# Patient Record
Sex: Female | Born: 1980 | Race: White | Hispanic: No | Marital: Single | Smoking: Never smoker
Health system: Southern US, Community
[De-identification: ages and names within clinical notes are randomized; demographics above are authoritative.]

## PROBLEM LIST (undated history)

## (undated) DIAGNOSIS — C801 Malignant (primary) neoplasm, unspecified: Secondary | ICD-10-CM

## (undated) DIAGNOSIS — R519 Headache, unspecified: Secondary | ICD-10-CM

## (undated) DIAGNOSIS — F419 Anxiety disorder, unspecified: Secondary | ICD-10-CM

## (undated) DIAGNOSIS — Z923 Personal history of irradiation: Secondary | ICD-10-CM

## (undated) DIAGNOSIS — M1990 Unspecified osteoarthritis, unspecified site: Secondary | ICD-10-CM

## (undated) DIAGNOSIS — K219 Gastro-esophageal reflux disease without esophagitis: Secondary | ICD-10-CM

## (undated) DIAGNOSIS — F410 Panic disorder [episodic paroxysmal anxiety] without agoraphobia: Secondary | ICD-10-CM

## (undated) DIAGNOSIS — F32A Depression, unspecified: Secondary | ICD-10-CM

## (undated) DIAGNOSIS — D649 Anemia, unspecified: Secondary | ICD-10-CM

## (undated) HISTORY — DX: Depression, unspecified: F32.A

## (undated) HISTORY — DX: Anxiety disorder, unspecified: F41.9

---

## 2020-02-28 ENCOUNTER — Telehealth: Payer: Self-pay | Admitting: Hematology

## 2020-02-28 NOTE — Telephone Encounter (Signed)
Received an urgent msg from [facility], breast navigator, regarding a referral from [facility] mammography for metastatic breast cancer. Caitlyn Hendricks has been cld and scheduled to see Caitlyn Hendricks on 9/3 at 11am. Pt aware to arrive 15 minutes early.

## 2020-03-08 NOTE — Progress Notes (Signed)
[place]   Telephone:([phone] Fax:([phone]) [place] Note   Patient Care Team: [name], MD as PCP - General (Family Medicine) Mauro Kaufmann, RN as Oncology Nurse Navigator Rockwell Germany, RN as Oncology Nurse Navigator  Date of Service:  03/10/2020   CHIEF COMPLAINTS/PURPOSE OF CONSULTATION:  Newly Diagnosed Left Breast cancer   REFERRING PHYSICIAN:  Gyn, Dr Zenaida Niece   Oncology History Overview Note  Cancer Staging Cancer of central portion of left female breast [facility]) Staging form: Breast, AJCC 8th Edition - Clinical: No stage assigned - Unsigned    Cancer of central portion of left female breast ([place])  02/23/2020 Mammogram   Diagnostic Mammogram 02/23/20  IMPRESSION The 2x1x2.6cm irregular mass in teh left breast at 12:00 posiiton middle depth is highly suspicious of malignancy. An [place] is recommended for further evaluation and biopsy planning purposes.   02/23/2020 Breast US   [name] 02/23/20  IMPRESSION 2 adjacent spiculated masses in the left brast at 12:00 position 3 cm from the nipple (2.1x0.9x1.1cm and 1.1x1.4x0.5cm) is suggestive of malignancy.   Multiple abnormal left subpectoral and left axillary nodes measuring 3.3x2.1 cm concerning for metastatic adenopathy.    Left breast skin thickening and edema may be secondary congestive edema due to extensive axillary adenopathy.    03/08/2020 Initial Biopsy   Diagnosis 1.Breast, left, needle core biopsy, 12:00 position, 3cmfn -INVASIVE DUCTAL CARCINOMA -SEE COMMENT  2. Lymph node, needle/core biopsy, left axilla -METASTATIC CARCINOMA INVOLVING A LYMPH NODE  -LYMPHOVASCULAR SPACE INVASION PRESENT   Microscopic Comment  1.Based on the biopsy the carcinoma appears Nottingham Grade 3 or 3 and measures 1 cm in the greatest linear extent.    03/08/2020 Receptors her2   ER- Negative 0% PR - Negative 0% HER2 - Negative  KI 67 - 80%    03/08/2020 Cancer Staging    Staging form: Breast, AJCC 8th Edition - Clinical stage from 03/08/2020: Stage IIIB (cT2, cN1, cM0, G3, ER-, PR-, HER2-) - Signed by Truitt Merle, MD on 03/10/2020   03/10/2020 Initial Diagnosis   Cancer of central portion of left female breast ([place])   03/22/2020 -  Neo-Adjuvant Chemotherapy   Adriamycin and Cytoxan q2weeks for 4 cycles starting 03/22/20 followed by weekly Taxol and Carboplatin for 12 weeks      HISTORY OF PRESENTING ILLNESS:  Caitlyn Hendricks 39 y.o. female is a here because of newly diagnosed left breast cancer. The patient was referred by Gyn, Dr Zenaida Niece. The patient presents to the clinic today accompanied by her mother.  She notes feeling left axillary changes first then noticed her left breast swelling 2 months ago before it started turning purple 2 weeks ago. She was seen by Gyn for this and started work up. She had not been seen for a while before this.  She notes currently having left breast pain, swelling with purple and red-ish skin which started before her biopsy. She also notes occasional right breast shooting pain since her other symptoms started. She notes left upper back and left ribcage pain intermittently. She denies abdominal issues, nausea, change in appetite or weight change, no cough, SOB, vision issues. She has normal BMs. She has mild sore throat. She has not had her COVID19 vaccine, but interested in getting it.   Socially she lives with her boyfriend and her mother lives next door. She notes her boyfriend has vasectomy and she does not plan to have any children. She stopped drinking 30 days  ago and does not plan to restart. She has h/o binge drinking occasionally in the past year due to life stressed. She smoked socially many years ago and recently vaped occasionally but has stopped. She uses Marijuana occasionally.  She has a PMHx of depression and anxiety which is managed by PCP Dr Nolon Rod. She is on Xanax 69m TID and Celexa 530m She notes her main mood issues are  doing the day than at night. Her recent cancer diagnosis has increased her anxiety. She notes her period is regular but changing since she has gotten older. She notes she will feel more depressed before her cycle starts and will have breast swelling with the past 3 cycles. After PMS stopped this would resolve.  She notes her MGM had breast cancer in her late 6095sHer father had prostate cancer.    GYN HISTORY  Menarchal: 13/14 LMP: 02/2020  Contraceptive: No HRT: NA G0    REVIEW OF SYSTEMS:    Constitutional: Denies fevers, chills or abnormal night sweats Eyes: Denies blurriness of vision, double vision or watery eyes Ears, nose, mouth, throat, and face: Denies mucositis or sore throat Respiratory: Denies cough, dyspnea or wheezes Cardiovascular: Denies palpitation, chest discomfort or lower extremity swelling Gastrointestinal:  Denies nausea, heartburn or change in bowel habits Skin: Denies abnormal skin rashes Lymphatics: Denies new lymphadenopathy or easy bruising MSK: (+) Intermittent Left upper back and left ribcage pain  Neurological:Denies numbness, tingling or new weaknesses Behavioral/Psych: Mood is stable, no new changes Breast: (+) Left breast pain, swelling, purplish/reddish skin color  All other systems were reviewed with the patient and are negative.   MEDICAL HISTORY:  Past Medical History:  Diagnosis Date  . Anxiety   . Depression     SURGICAL HISTORY: History reviewed. No pertinent surgical history.  SOCIAL HISTORY: Social History   Socioeconomic History  . Marital status: Single    Spouse name: Not on file  . Number of children: 0  . Years of education: Not on file  . Highest education level: Not on file  Occupational History  . Occupation: doAir traffic controller Tobacco Use  . Smoking status: Never Smoker  . Smokeless tobacco: Former Us[employer] engineernd Sexual Activity  . Alcohol use: Not on file    Comment: bing drink for last year, usually liquor   .  Drug use: Yes    Types: Marijuana  . Sexual activity: Yes  Other Topics Concern  . Not on file  Social History Narrative  . Not on file   Social Determinants of Health   Financial Resource Strain:   . Difficulty of Paying Living Expenses: Not on file  Food Insecurity:   . Worried About RuCharity fundraisern the Last Year: Not on file  . Ran Out of Food in the Last Year: Not on file  Transportation Needs:   . Lack of Transportation (Medical): Not on file  . Lack of Transportation (Non-Medical): Not on file  Physical Activity:   . Days of Exercise per Week: Not on file  . Minutes of Exercise per Session: Not on file  Stress:   . Feeling of Stress : Not on file  Social Connections:   . Frequency of Communication with Friends and Family: Not on file  . Frequency of Social Gatherings with Friends and Family: Not on file  . Attends Religious Services: Not on file  . Active Member of Clubs or Organizations: Not on file  . Attends ClArchivist  Meetings: Not on file  . Marital Status: Not on file  Intimate Partner Violence:   . Fear of Current or Ex-Partner: Not on file  . Emotionally Abused: Not on file  . Physically Abused: Not on file  . Sexually Abused: Not on file    FAMILY HISTORY: Family History  Problem Relation Age of Onset  . Cancer Father        prostate cancer   . Cancer Maternal Grandmother 68       breast cancer    ALLERGIES:  has no allergies on file.  MEDICATIONS:  No current outpatient medications on file.   No current facility-administered medications for this visit.    PHYSICAL EXAMINATION: ECOG PERFORMANCE STATUS: 0 - Asymptomatic  Vitals:   03/10/20 1118  BP: 126/79  Pulse: 71  Resp: 18  Temp: (!) 97 F (36.1 C)  SpO2: 100%   Filed Weights   03/10/20 1118  Weight: 180 lb 4.8 oz (81.8 kg)    GENERAL:alert, no distress and comfortable SKIN: skin color, texture, turgor are normal, no rashes or significant lesions EYES: normal,  Conjunctiva are pink and non-injected, sclera clear  NECK: supple, thyroid normal size, non-tender, without nodularity LYMPH:  no palpable lymphadenopathy in the cervical, axillary  LUNGS: clear to auscultation and percussion with normal breathing effort HEART: regular rate & rhythm and no murmurs and no lower extremity edema ABDOMEN:abdomen soft, non-tender and normal bowel sounds Musculoskeletal:no cyanosis of digits and no clubbing  NEURO: alert & oriented x 3 with fluent speech, no focal motor/sensory deficits BREAST: (+) 3x1-3x2cm palpable left axillary LN, tender (+) Central Left breast mild swelling with diffuse pinkish skin of most left breast (+) Palpable 4x6cm mass in 11-12:00 position of left breast, very tender. Right Breast exam benign.  LABORATORY DATA:  I have reviewed the data as listed No flowsheet data found.  No flowsheet data found.   RADIOGRAPHIC STUDIES: I have personally reviewed the radiological images as listed and agreed with the findings in the report. No results found.  ASSESSMENT & PLAN:  Caitlyn Hendricks is a 39 y.o. Caucasian female with a history of Anxiety/Depression   1.  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 Her US showed 2 adjacent left breast masses at 12:00 position spanning 3.6cm on 02/23/20 mammogram/US with multiple enlarged left axillary (at least 5) and left subpectoral LNs. 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 Her ER/PR/HER2 markers were all negative.  -We discussed the aggressive nature of triple negative breast cancer, and the high risk of local and distant recurrence after surgical resection.  Given her multiple axillary and subpectoral adenopathy this is at least locally advanced, and distant metastasis needs to be  ruled out. -I recommend Breast MRI and CT CAP/Bone scan or PET to complete staging and rule out distant metastasis. She is agreeable.  -If her staging scan is negative for distant metastasis, I recommend neoadjuvant chemo therapy to downstage her before surgery. She plans to consult with Surgeon Dr. Donne Hazel on 9/8. She would need left axillary LN dissection given extensive LN involvement. She is interested in b/l mastectomy, I explained to her she may not need it if her genetics is negative and she has good response to chemo  - I recommend neoadjuvant chemotherapy with more  intensive IV Adriamycin and Cytoxan q2weeks for 4 cycles followed by weekly Taxol and Carboplatin for 12 weeks.   --Chemotherapy consent: Side effects including but does not limited to, fatigue, nausea, vomiting, diarrhea, hair loss, neuropathy, fluid retention, renal and kidney dysfunction, neutropenic fever, needed for blood transfusion, bleeding, small risk of leukemia, cardiomyopathy, infertility, etc., were discussed with patient in great detail. She agrees to proceed. -If her staging scan shows distant metastasis, the chemotherapy will likely be Abraxane and Tecentriq if her tumor PD-L1(+)  -I discussed chemotherapy can suppress her ovarian function and can effect her becoming pregnant in the future. She does not plan to have any children.  -I discussed standard care also includes Radiation given her positive Lymph nodes. Given her ER/PR negative disease she does not require antiestrogen therapy.  -I discussed after breast surgery and treatment she will still have a higher risk of breast cancer recurrence than usual. She will be followed closely with surveillance after treatment.  --Prior to starting chemo she will have PAC placed, baseline ECHO and chemo education class. Plan to start chemo in 2-3 weeks.  -Today's breast exam shows 3cm palpable LN and 4x6cm left breast mass both very tender. Will monitor on chemo for clinical  response. She also has central left breast swelling with diffuse pink-ish skin. I will send message to Dr Donne Hazel for possible punch biopsy in case of inflammatory breast cancer involving the skin. -F/u with start of treatment.    2. Genetic Testing -Given her young age, Her MGM's breast cancer and father prostate cancer, she is eligible for genetic testing. She is agreeable, I will send referral.  -If she is found to have BRCA mutation, prophylactic b/l mastectomy and BSO would be recommended.  -I also discussed the effect of PARP inhibitor after surgery if she has BRCA1/2 mutation    3. Anxiety/Depression, Social Support  -She is currently on Xanax 52m TID and Celexa 559m This is managed by her PCP. -She has been more anxious with her cancer diagnosis and would like dose increase. I will copy message to her PCP.  -She reports talking about this has not helped in the past. I offered her chance to speak with SW given her recent cancer diagnosis. She is agreeable.  -She notes she has been stressed this past year and was binge drinking occasionally. She quit drinking 02/09/20 and does not plan to restart. She does not plan to restart smoking or vaping.  -Her boyfriend works from home and her mother works in a school. He mother plans to be her primary caregiver and is willing to stop working if needed. She has very good social support.  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 I offered seeing financial office and advocate if needed.   4. Mild Sore throat -In the past 1 weeks she has had mild scratchy throat. She is not interested in COWrenesting at this point.  -I discussed she can proceed with COVID vaccination series before starting chemo. She is willing to proceed right after her Breast MRI    PLAN:  -Send Genetic Referral  -Copy message to PCP about anxiety medication dose increase.  -first COVID19 vaccine in 1-2 weeks after  breast MRI and CT scans  -CT CAP/Bone scan in 1-2 weeks  -Breast MRI next week  -Send SW referral for anxiety counseling -Echo in 1-2 weeks  -PAC placement on 9/14. Copy note to Dr WaDonne Hazelbout  possible punch biopsy.  -Chemo education class in 1-2 weeks  -Lab, flush, f/u and chemo AC on 9/15 and 2 and 4 weeks later   Orders Placed This Encounter  Procedures  . CBC with Differential (Cancer Center Only)    Standing Status:   Future    Standing Expiration Date:   03/10/2021  . CMP ([facility] only)    Standing Status:   Future    Standing Expiration Date:   03/10/2021  . CA 27.29    Standing Status:   Future    Standing Expiration Date:   03/10/2021  . Ambulatory referral to Genetics    Referral Priority:   Routine    Referral Type:   Consultation    Referral Reason:   Specialty Services Required    Number of Visits Requested:   1  . ECHOCARDIOGRAM COMPLETE    Standing Status:   Future    Standing Expiration Date:   03/10/2021    Order Specific Question:   Where should this test be performed    Answer:   [facility]    Order Specific Question:   Perflutren DEFINITY (image enhancing agent) should be administered unless hypersensitivity or allergy exist    Answer:   Administer Perflutren    Order Specific Question:   Is a special reader required? (athlete or structural heart)    Answer:   Yes    Order Specific Question:   Does this study need to be read by the Structural team/Level 3 readers?    Answer:   No    Order Specific Question:   Reason for exam-Echo    Answer:   Chemotherapy evaluation  v87.41 / v58.11    All questions were answered. The patient knows to call the clinic with any problems, questions or concerns. The total time spent in the appointment was 60 minutes.     Truitt Merle, MD 03/10/2020 2:44 PM  I, Joslyn Devon, am acting as scribe for Truitt Merle, MD.   I have reviewed the above documentation for accuracy and completeness, and I agree with the above.

## 2020-03-09 ENCOUNTER — Other Ambulatory Visit: Payer: Self-pay | Admitting: Radiology

## 2020-03-09 DIAGNOSIS — Z1509 Genetic susceptibility to other malignant neoplasm: Secondary | ICD-10-CM

## 2020-03-10 ENCOUNTER — Inpatient Hospital Stay: Payer: 59 | Attending: Hematology | Admitting: Hematology

## 2020-03-10 ENCOUNTER — Encounter: Payer: Self-pay | Admitting: Hematology

## 2020-03-10 ENCOUNTER — Other Ambulatory Visit: Payer: Self-pay | Admitting: General Surgery

## 2020-03-10 ENCOUNTER — Other Ambulatory Visit: Payer: Self-pay | Admitting: *Deleted

## 2020-03-10 ENCOUNTER — Encounter: Payer: Self-pay | Admitting: *Deleted

## 2020-03-10 ENCOUNTER — Telehealth: Payer: Self-pay | Admitting: *Deleted

## 2020-03-10 ENCOUNTER — Other Ambulatory Visit: Payer: Self-pay

## 2020-03-10 ENCOUNTER — Telehealth: Payer: Self-pay | Admitting: Hematology

## 2020-03-10 DIAGNOSIS — F418 Other specified anxiety disorders: Secondary | ICD-10-CM | POA: Diagnosis not present

## 2020-03-10 DIAGNOSIS — Z5189 Encounter for other specified aftercare: Secondary | ICD-10-CM | POA: Diagnosis not present

## 2020-03-10 DIAGNOSIS — C50112 Malignant neoplasm of central portion of left female breast: Secondary | ICD-10-CM | POA: Diagnosis not present

## 2020-03-10 DIAGNOSIS — Z5111 Encounter for antineoplastic chemotherapy: Secondary | ICD-10-CM | POA: Diagnosis present

## 2020-03-10 DIAGNOSIS — K5900 Constipation, unspecified: Secondary | ICD-10-CM | POA: Insufficient documentation

## 2020-03-10 DIAGNOSIS — J029 Acute pharyngitis, unspecified: Secondary | ICD-10-CM | POA: Insufficient documentation

## 2020-03-10 DIAGNOSIS — Z23 Encounter for immunization: Secondary | ICD-10-CM | POA: Insufficient documentation

## 2020-03-10 DIAGNOSIS — Z171 Estrogen receptor negative status [ER-]: Secondary | ICD-10-CM | POA: Diagnosis not present

## 2020-03-10 DIAGNOSIS — Z79899 Other long term (current) drug therapy: Secondary | ICD-10-CM | POA: Insufficient documentation

## 2020-03-10 NOTE — Telephone Encounter (Signed)
Scheduled appointment per 9/3 scheduling message. Patient is aware of appointment details.

## 2020-03-10 NOTE — Telephone Encounter (Signed)
Left vm regarding navigation resources and contact information. Request return call.

## 2020-03-11 ENCOUNTER — Encounter: Payer: Self-pay | Admitting: Hematology

## 2020-03-11 NOTE — Progress Notes (Signed)
START ON PATHWAY REGIMEN - Breast     A cycle is every 14 days (cycles 1-4):     Doxorubicin      Cyclophosphamide      Pegfilgrastim-xxxx    A cycle is every 21 days (cycles 5-8):     Paclitaxel      Carboplatin   **Always confirm dose/schedule in your pharmacy ordering system**  Patient Characteristics: Preoperative or Nonsurgical Candidate (Clinical Staging), Neoadjuvant Therapy followed by Surgery, Invasive Disease, Chemotherapy, HER2 Negative/Unknown/Equivocal, ER Negative/Unknown, Platinum Therapy Indicated Therapeutic Status: Preoperative or Nonsurgical Candidate (Clinical Staging) AJCC M Category: cM0 AJCC Grade: G3 Breast Surgical Plan: Neoadjuvant Therapy followed by Surgery ER Status: Negative (-) AJCC 8 Stage Grouping: IIIB HER2 Status: Negative (-) AJCC T Category: cT2 AJCC N Category: cN1 PR Status: Negative (-) Type of Therapy: Platinum Therapy Indicated Intent of Therapy: Curative Intent, Discussed with Patient 

## 2020-03-14 ENCOUNTER — Other Ambulatory Visit: Payer: Self-pay | Admitting: Radiology

## 2020-03-14 ENCOUNTER — Telehealth: Payer: Self-pay | Admitting: *Deleted

## 2020-03-14 ENCOUNTER — Other Ambulatory Visit: Payer: Self-pay | Admitting: Hematology

## 2020-03-14 NOTE — Telephone Encounter (Signed)
Confirmed future appts for scans and chemo. Discussed contrast and when to drink it for CT scan. Denies further questions at this time.

## 2020-03-14 NOTE — Telephone Encounter (Signed)
Spoke pt to concerning staging scans as well as MRI, echo and chemo class. Left vm with detailed information regarding scans with date and time. Contact information provided for questions or needs.

## 2020-03-15 ENCOUNTER — Encounter: Payer: Self-pay | Admitting: *Deleted

## 2020-03-15 ENCOUNTER — Other Ambulatory Visit: Payer: Self-pay | Admitting: *Deleted

## 2020-03-15 ENCOUNTER — Other Ambulatory Visit: Payer: Self-pay | Admitting: Hematology

## 2020-03-15 ENCOUNTER — Telehealth: Payer: Self-pay | Admitting: *Deleted

## 2020-03-15 ENCOUNTER — Other Ambulatory Visit: Payer: Self-pay | Admitting: Radiology

## 2020-03-15 ENCOUNTER — Inpatient Hospital Stay: Payer: 59

## 2020-03-15 ENCOUNTER — Other Ambulatory Visit: Payer: Self-pay

## 2020-03-15 DIAGNOSIS — C50112 Malignant neoplasm of central portion of left female breast: Secondary | ICD-10-CM

## 2020-03-15 MED ORDER — LIDOCAINE-PRILOCAINE 2.5-2.5 % EX CREA: TOPICAL_CREAM | CUTANEOUS | 3 refills | Status: DC

## 2020-03-15 MED ORDER — PROCHLORPERAZINE MALEATE 10 MG PO TABS: 10.0000 mg | ORAL_TABLET | Freq: Four times a day (QID) | ORAL | 1 refills | Status: DC | PRN

## 2020-03-15 MED ORDER — PROCHLORPERAZINE MALEATE 10 MG PO TABS: 10.0000 mg | ORAL_TABLET | Freq: Four times a day (QID) | ORAL | 2 refills | Status: DC | PRN

## 2020-03-15 MED ORDER — ONDANSETRON HCL 8 MG PO TABS: 8.0000 mg | ORAL_TABLET | Freq: Two times a day (BID) | ORAL | 1 refills | Status: DC | PRN

## 2020-03-15 MED ORDER — ZOLPIDEM TARTRATE 5 MG PO TABS: 5.0000 mg | ORAL_TABLET | Freq: Every evening | ORAL | 0 refills | Status: DC | PRN

## 2020-03-15 MED ORDER — ONDANSETRON HCL 8 MG PO TABS: 8.0000 mg | ORAL_TABLET | Freq: Two times a day (BID) | ORAL | 2 refills | Status: DC | PRN

## 2020-03-15 NOTE — Progress Notes (Signed)
[place] Work  Clinical Social Work received referral from medical oncology for increased anxiety and counseling.  CSW contacted patient by phone to offer support and assess for needs.  Patient stated she was feeling overwhelmed with the number of appointments and was anxious to get treatment started.  CSW and patient discussed common feelings and emotions when being diagnosed with cancer.  CSW provided education on CSW role, the support team and support services at [facility].  Patient expressed interest in support programs and was open to a referral to the [facility] counseling intern.  Patient also expressed financial concerns for medical expenses.  CSW briefly reviewed financial resources and will provide additional information on 9/15 when patient is at [facility].  CSW provided contact information and encouraged patient to call with needs or concerns.  Johnnye Lana, MSW, LCSW, OSW-C Clinical Social Worker [facility] [phone]

## 2020-03-15 NOTE — Telephone Encounter (Signed)
Spoke to pt concerning new bone scan appt for 9/14. Discussed location and time. Pt did genetic testing at [facility] with Dr. Donne Hazel, genetic testing cancelled. Referral placed for PT for assessment and sozo prior to start of chemo.

## 2020-03-16 ENCOUNTER — Encounter (HOSPITAL_BASED_OUTPATIENT_CLINIC_OR_DEPARTMENT_OTHER): Payer: Self-pay | Admitting: General Surgery

## 2020-03-16 ENCOUNTER — Ambulatory Visit
Admission: RE | Admit: 2020-03-16 | Discharge: 2020-03-16 | Disposition: A | Discharge status: Home / Self Care | Payer: 59 | Source: Ambulatory Visit | Attending: Radiology | Admitting: Radiology

## 2020-03-16 ENCOUNTER — Ambulatory Visit (HOSPITAL_COMMUNITY): Payer: 59

## 2020-03-16 ENCOUNTER — Other Ambulatory Visit: Payer: Self-pay

## 2020-03-16 ENCOUNTER — Encounter (HOSPITAL_COMMUNITY): Payer: 59

## 2020-03-16 ENCOUNTER — Telehealth: Payer: Self-pay | Admitting: *Deleted

## 2020-03-16 ENCOUNTER — Telehealth: Payer: Self-pay | Admitting: Hematology

## 2020-03-16 DIAGNOSIS — Z1509 Genetic susceptibility to other malignant neoplasm: Secondary | ICD-10-CM

## 2020-03-16 IMAGING — MR MR BREAST BILAT WO/W CM
8 of 13 series · 29 of 48 positions shown · IV contrast (Multihance)
Comparison: Recent mammogram, ultrasound and biopsy examinations at
[facility].
COMPARISON: Recent mammogram, ultrasound and biopsy examinations at
[facility].

Addendum:
CLINICAL DATA: Recently diagnosed invasive ductal carcinoma in the
12 o'clock position of the left breast and metastatic left axillary
lymph node with lymphovascular invasion.

LABS:  None obtained on site today.
EXAM:
BILATERAL BREAST MRI WITH AND WITHOUT CONTRAST
TECHNIQUE: Multiplanar, multisequence MR images of both breasts were obtained
prior to and following the intravenous administration of 8 ml of
Gadavist

[Series 2: t2_tirm_tra ipat (a-p) · axial · 3.0mm · 0.74mm/px · 1 of 61 slices shown]
[im 1/61]
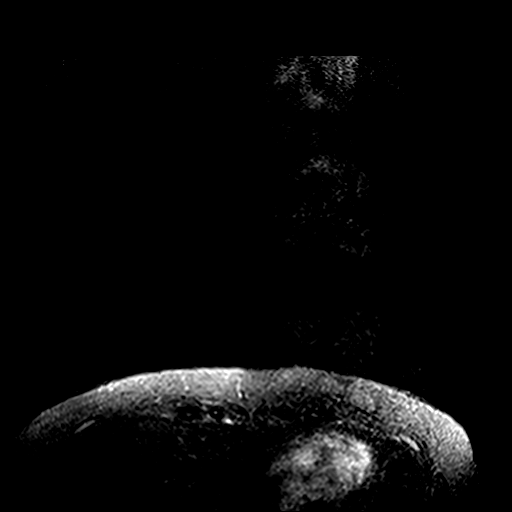

[Series 3: fl3d pre-cm no · axial · non-contrast · 1.2mm · 0.99mm/px · z∈[-63,+128]mm · 5 of 160 slices shown]
[im 1/160]
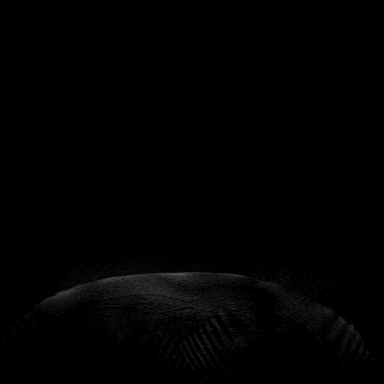
[im 40/160]
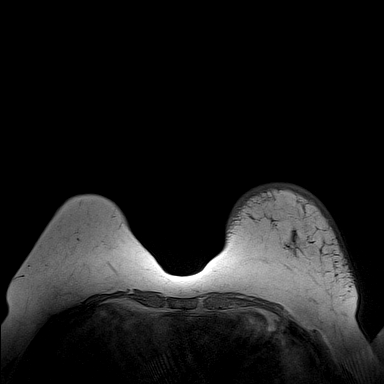
[im 80/160]
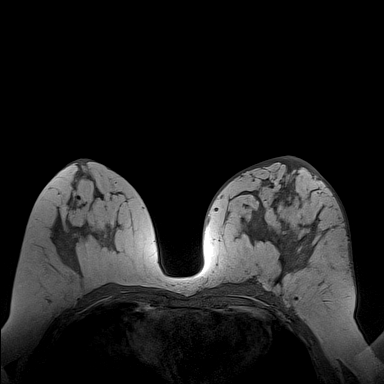
[im 120/160]
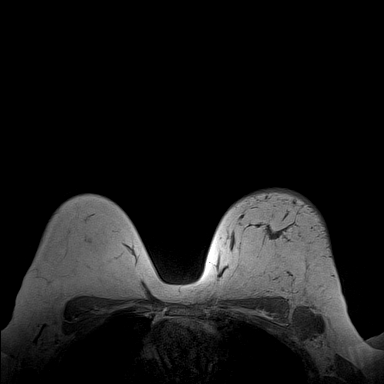
[im 160/160]
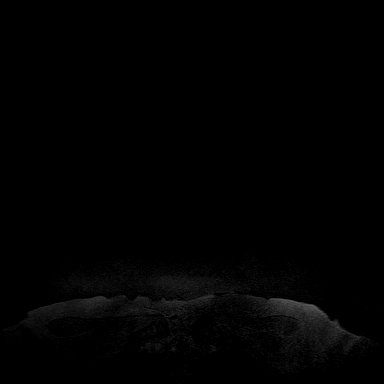

[Series 4: fl3d pre-cm · axial · non-contrast · 1.2mm · 0.99mm/px · z∈[-63,+128]mm · 5 of 160 slices shown]
[im 1/160]
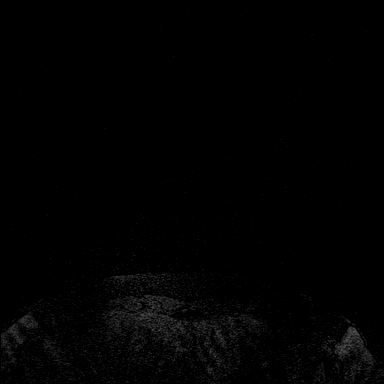
[im 40/160]
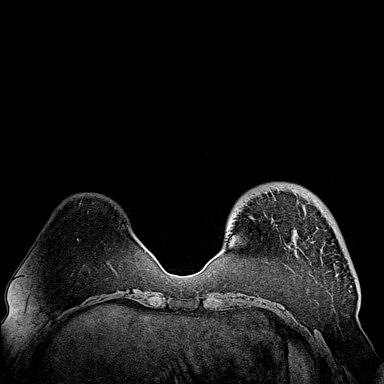
[im 80/160]
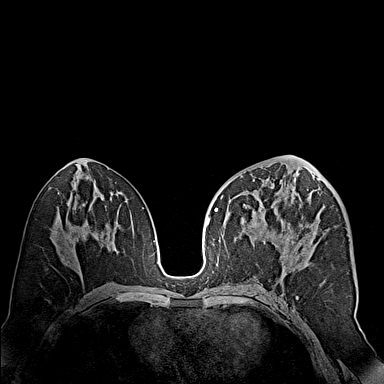
[im 120/160]
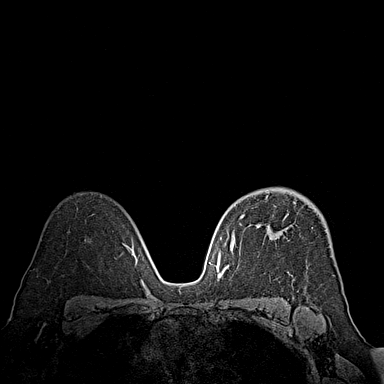
[im 160/160]
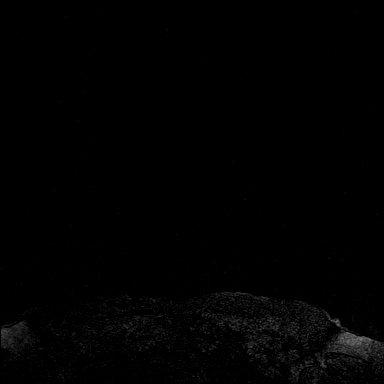

[Series 5: fl3d post-cm 20 · axial · 1.2mm · 0.99mm/px · z∈[-63,+128]mm · 5 of 160 slices shown (1 of 3)]
[im 1/160]
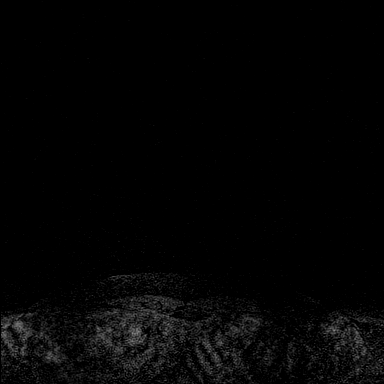
[im 40/160]
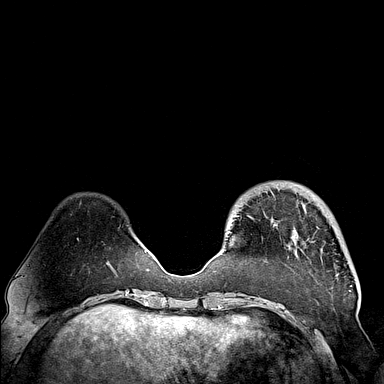
[im 80/160]
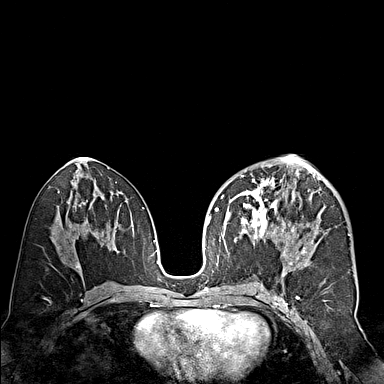
[im 120/160]
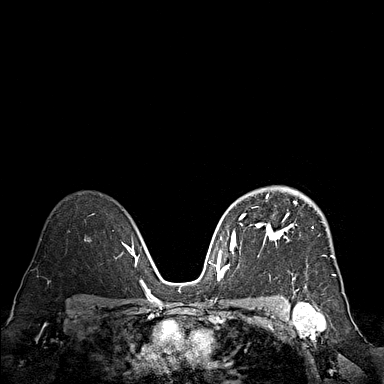
[im 160/160]
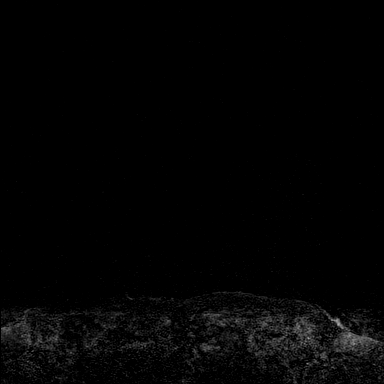

[Series 6: fl3d post-cm 20 · axial · 1.2mm · 0.99mm/px · z∈[-63,+128]mm · 5 of 160 slices shown (2 of 3)]
[im 1/160]
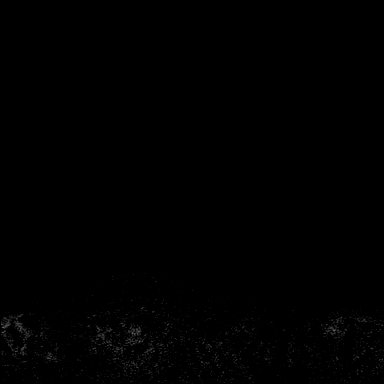
[im 40/160]
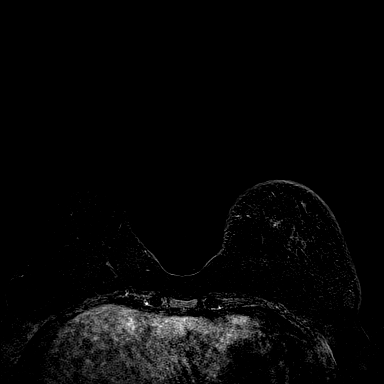
[im 80/160]
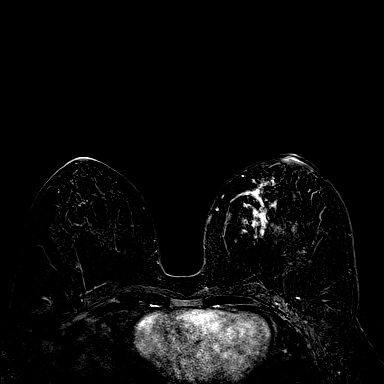
[im 120/160]
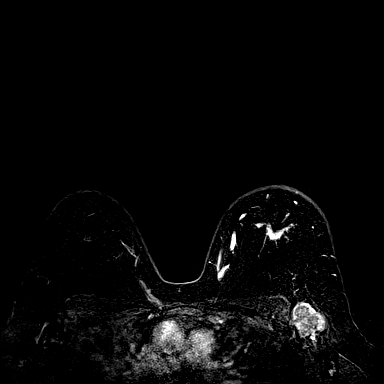
[im 160/160]
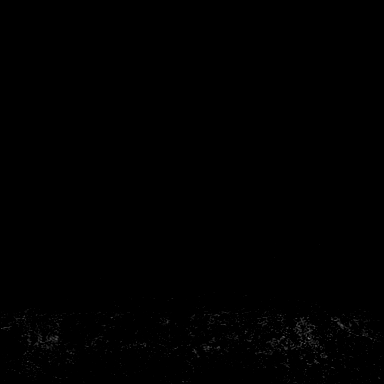

[Series 7: fl3d post-cm 20 · axial · 192.0mm · 0.99mm/px · 1 of 1 slices shown (3 of 3)]
[im 1/1]
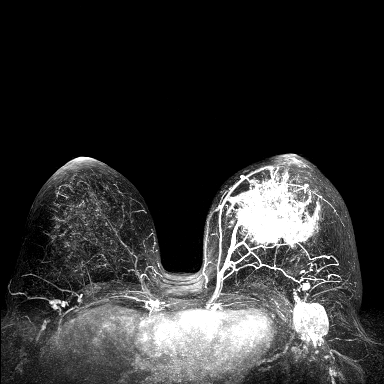

[Series 8: fl3d post-cm 3min · axial · 1.2mm · 0.99mm/px · z∈[-63,+128]mm · 5 of 160 slices shown]
[im 1/160]
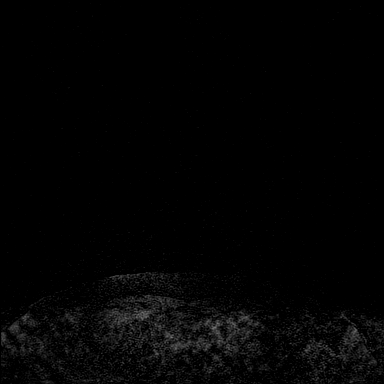
[im 40/160]
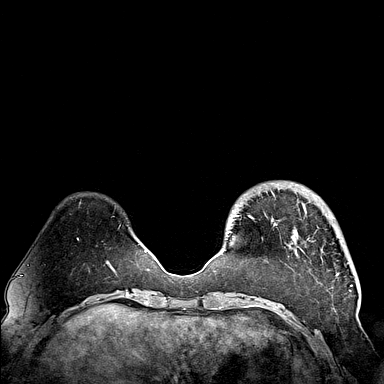
[im 80/160]
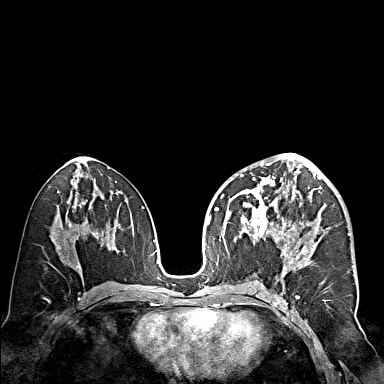
[im 120/160]
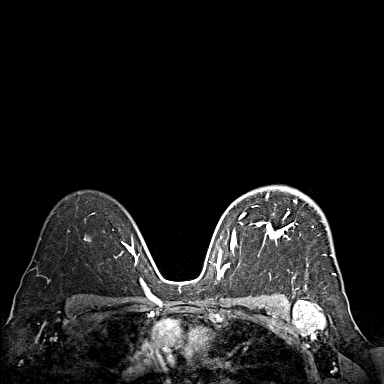
[im 160/160]
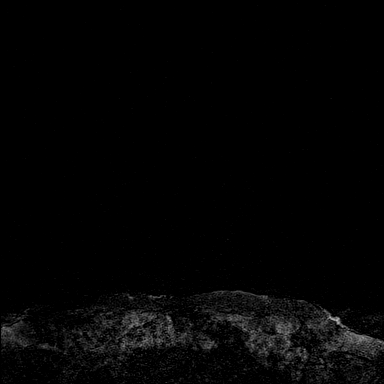

[Series 9: fl3d post-cm 3min_sub · axial · 1.2mm · 0.99mm/px · z∈[-63,-26]mm · 2 of 160 slices shown]
[im 1/160]
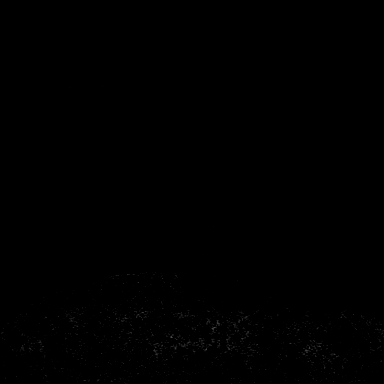
[im 32/160]
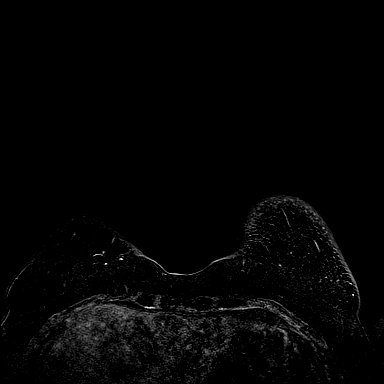

[29 of 48 positions shown; findings below may reference images not displayed]

Three-dimensional MR images were rendered by post-processing of the
original MR data on an independent workstation. The
three-dimensional MR images were interpreted, and findings are
reported in the following complete MRI report for this study. Three
dimensional images were evaluated at the independent interpreting
workstation using the DynaCAD thin client.
FINDINGS: Breast composition: c. Heterogeneous fibroglandular tissue.

Background parenchymal enhancement: Mild.

Right breast: No mass or abnormal enhancement.

Left breast: Large heterogeneously enhancing mass in the central
left breast containing a biopsy marker clip artifact superomedially.
This involves the upper outer, upper inner and lower inner quadrants
of the breast and measures 8.1 x 7.8 x 6.6 cm. This has a mixture of
enhancement kinetics, including rapid wash-in/washout.

Inferior to that mass, there is a 3.0 x 1.7 x 1.1 cm
similar-appearing mass in the lower inner quadrant. Together, the
masses span 9.3 cm in length.

Lymph nodes: Multiple enlarged level 1 and level 2 left axillary
lymph nodes. The largest is in the inferior aspect of the left
axilla and contains a biopsy marker clip artifact. This node
measures 3.6 x 2.5 cm on image number 39 series 5.

No enlarged internal mammary or right axillary lymph nodes.

Ancillary findings:  None.
IMPRESSION: 1. 8.1 x 7.8 x 6.6 cm biopsy proven invasive ductal carcinoma in the
central right breast, involving 3 quadrants.
2. 3.0 x 1.7 x 1.1 cm satellite mass more inferiorly in lower inner
quadrant of the left breast, compatible with additional malignancy.
3. Metastatic level 1 and level 2 left axillary lymph nodes.
4. No evidence of malignancy on the right.

RECOMMENDATION:
Treatment plan.

BI-RADS CATEGORY  6: Known biopsy-proven malignancy.

ADDENDUM:
The 1st impression should read as follows: 8.1 x 7.8 x 6.6 cm
biopsy-proven invasive ductal carcinoma in the central left breast,
involving a 3 quadrants.

*** End of Addendum ***
Three-dimensional MR images were rendered by post-processing of the
original MR data on an independent workstation. The
three-dimensional MR images were interpreted, and findings are
reported in the following complete MRI report for this study. Three
dimensional images were evaluated at the independent interpreting
workstation using the DynaCAD thin client.
FINDINGS: Breast composition: c. Heterogeneous fibroglandular tissue.

Background parenchymal enhancement: Mild.

Right breast: No mass or abnormal enhancement.

Left breast: Large heterogeneously enhancing mass in the central
left breast containing a biopsy marker clip artifact superomedially.
This involves the upper outer, upper inner and lower inner quadrants
of the breast and measures 8.1 x 7.8 x 6.6 cm. This has a mixture of
enhancement kinetics, including rapid wash-in/washout.

Inferior to that mass, there is a 3.0 x 1.7 x 1.1 cm
similar-appearing mass in the lower inner quadrant. Together, the
masses span 9.3 cm in length.

Lymph nodes: Multiple enlarged level 1 and level 2 left axillary
lymph nodes. The largest is in the inferior aspect of the left
axilla and contains a biopsy marker clip artifact. This node
measures 3.6 x 2.5 cm on image number 39 series 5.

No enlarged internal mammary or right axillary lymph nodes.

Ancillary findings:  None.
IMPRESSION: 1. 8.1 x 7.8 x 6.6 cm biopsy proven invasive ductal carcinoma in the
central right breast, involving 3 quadrants.
2. 3.0 x 1.7 x 1.1 cm satellite mass more inferiorly in lower inner
quadrant of the left breast, compatible with additional malignancy.
3. Metastatic level 1 and level 2 left axillary lymph nodes.
4. No evidence of malignancy on the right.

RECOMMENDATION:
Treatment plan.

BI-RADS CATEGORY  6: Known biopsy-proven malignancy.

## 2020-03-16 MED ORDER — GADOBUTROL 1 MMOL/ML IV SOLN
8.0000 mL | Freq: Once | INTRAVENOUS | Status: AC | PRN
  Administered 2020-03-16: 8 mL via INTRAVENOUS

## 2020-03-16 NOTE — Telephone Encounter (Signed)
Scheduled appointments per 9/3 los. Patient is aware of appointments.

## 2020-03-16 NOTE — Progress Notes (Signed)
Pharmacist Chemotherapy Monitoring - Initial Assessment    Anticipated start date: 03/22/20   Regimen:  . Are orders appropriate based on the patient's diagnosis, regimen, and cycle? Yes . Does the plan date match the patient's scheduled date? Yes . Is the sequencing of drugs appropriate? Yes . Are the premedications appropriate for the patient's regimen? Yes . Prior Authorization for treatment is: Pending o If applicable, is the correct biosimilar selected based on the patient's insurance? not applicable  Organ Function and Labs: Marland Kitchen Are dose adjustments needed based on the patient's renal function, hepatic function, or hematologic function? No . Are appropriate labs ordered prior to the start of patient's treatment? Yes . Other organ system assessment, if indicated: anthracyclines: Echo/ MUGA . The following baseline labs, if indicated, have been ordered: N/A  Dose Assessment: . Are the drug doses appropriate? Yes . Are the following correct: o Drug concentrations Yes o IV fluid compatible with drug Yes o Administration routes Yes o Timing of therapy Yes . If applicable, does the patient have documented access for treatment and/or plans for port-a-cath placement? not applicable . If applicable, have lifetime cumulative doses been properly documented and assessed? not applicable Lifetime Dose Tracking  No doses have been documented on this patient for the following tracked chemicals: Doxorubicin, Epirubicin, Idarubicin, Daunorubicin, Mitoxantrone, Bleomycin, Oxaliplatin, Carboplatin, Liposomal Doxorubicin  o   Toxicity Monitoring/Prevention: . The patient has the following take home antiemetics prescribed: Ondansetron, Prochlorperazine, Dexamethasone and Lorazepam . The patient has the following take home medications prescribed: N/A . Medication allergies and previous infusion related reactions, if applicable, have been reviewed and addressed. Yes . The patient's current medication  list has been assessed for drug-drug interactions with their chemotherapy regimen. no significant drug-drug interactions were identified on review.  Order Review: . Are the treatment plan orders signed? Yes . Is the patient scheduled to see a provider prior to their treatment? Yes  I verify that I have reviewed each item in the above checklist and answered each question accordingly.  Brigett Estell K 03/16/2020 11:42 AM

## 2020-03-16 NOTE — Telephone Encounter (Signed)
Spoke to pt concerning breast MRI results as well as what BRCA positive means. Informed pt we do not have her genetic testing results back at this time. No further needs voiced at this time.

## 2020-03-17 ENCOUNTER — Encounter (HOSPITAL_COMMUNITY): Payer: Self-pay

## 2020-03-17 ENCOUNTER — Other Ambulatory Visit (HOSPITAL_COMMUNITY)
Admission: RE | Admit: 2020-03-17 | Discharge: 2020-03-17 | Disposition: A | Discharge status: Home / Self Care | Payer: 59 | Source: Ambulatory Visit | Attending: General Surgery | Admitting: General Surgery

## 2020-03-17 ENCOUNTER — Ambulatory Visit (HOSPITAL_COMMUNITY)
Admission: RE | Admit: 2020-03-17 | Discharge: 2020-03-17 | Disposition: A | Discharge status: Home / Self Care | Payer: 59 | Source: Ambulatory Visit | Attending: Hematology | Admitting: Hematology

## 2020-03-17 DIAGNOSIS — C50112 Malignant neoplasm of central portion of left female breast: Secondary | ICD-10-CM | POA: Diagnosis present

## 2020-03-17 DIAGNOSIS — Z20822 Contact with and (suspected) exposure to covid-19: Secondary | ICD-10-CM | POA: Insufficient documentation

## 2020-03-17 DIAGNOSIS — Z01812 Encounter for preprocedural laboratory examination: Secondary | ICD-10-CM | POA: Insufficient documentation

## 2020-03-17 LAB — SARS CORONAVIRUS 2 (TAT 6-24 HRS): SARS Coronavirus 2: NEGATIVE

## 2020-03-17 IMAGING — CT CT CHEST W/ CM
2 of 4 series · 12 of 36 positions shown, 15 images · IV contrast (OMNIPAQUE)
Comparison: No priors.

CLINICAL DATA: 39-year-old female with history of breast cancer
with lymphadenopathy. Originally diagnosed [DATE]. Patient has
yet to undergo treatment.

EXAM:
CT CHEST, ABDOMEN, AND PELVIS WITH CONTRAST
TECHNIQUE: Multidetector CT imaging of the chest, abdomen and pelvis was
performed following the standard protocol during bolus
administration of intravenous contrast.
CONTRAST:  100mL OMNIPAQUE IOHEXOL 300 MG/ML  SOLN

[Series 2: cap with · axial · 0.79mm/px · z∈[+976,+1526]mm · 9 of 134 slices shown, 12 images]
[im 12/134  mediastinal]
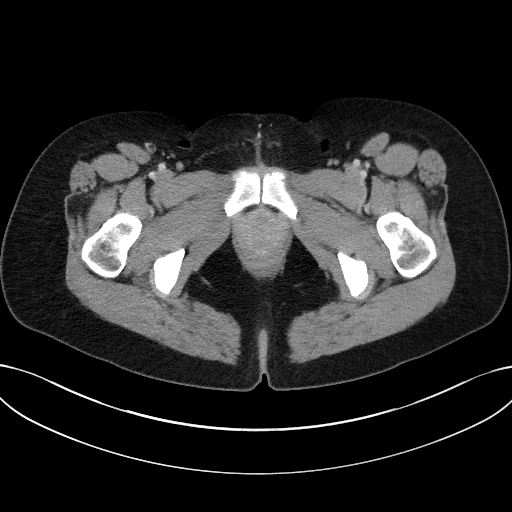
[im 12/134  lung]
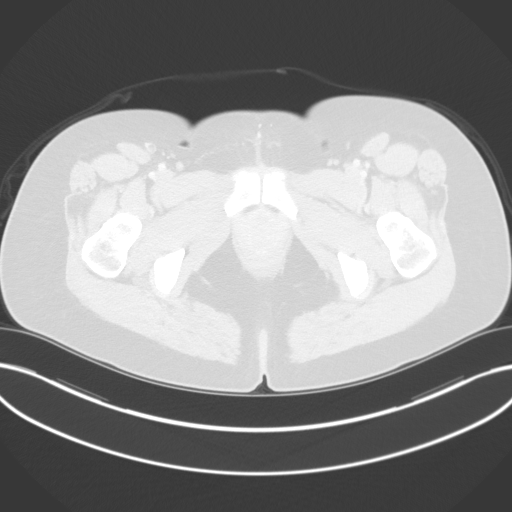
[im 23/134  lung]
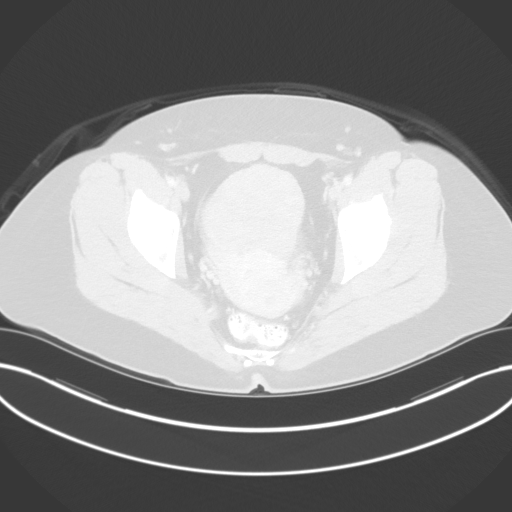
[im 45/134  lung]
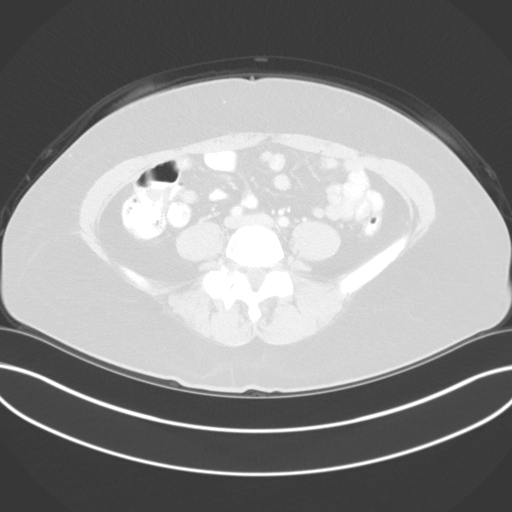
[im 56/134  lung]
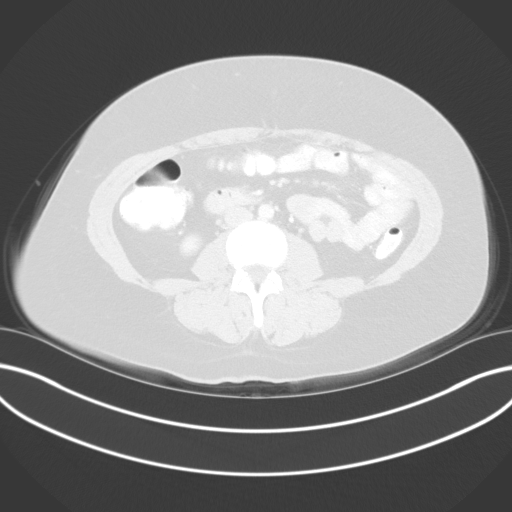
[im 67/134  mediastinal]
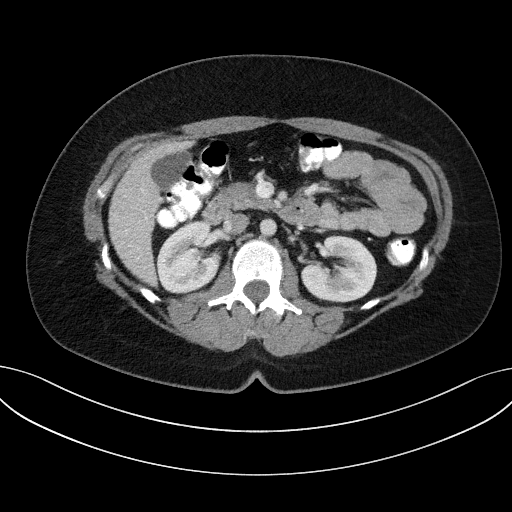
[im 67/134  lung]
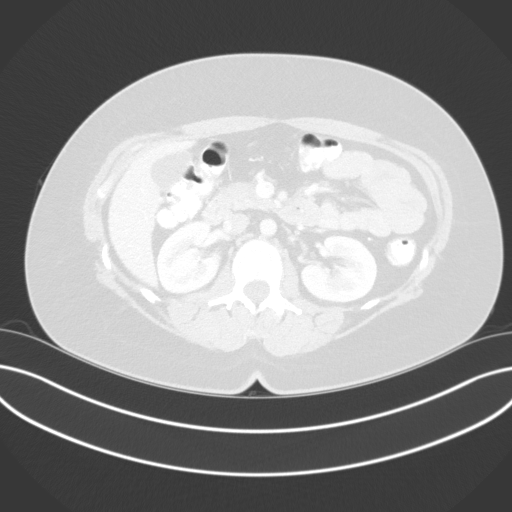
[im 78/134  lung]
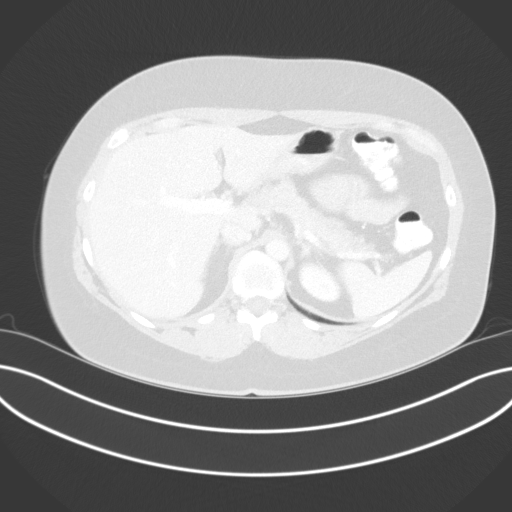
[im 89/134  lung]
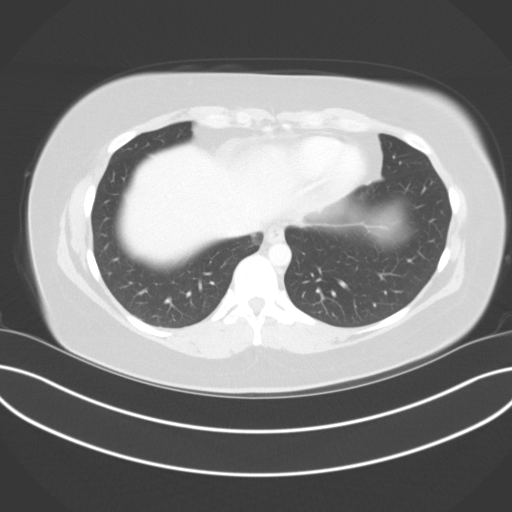
[im 111/134  lung]
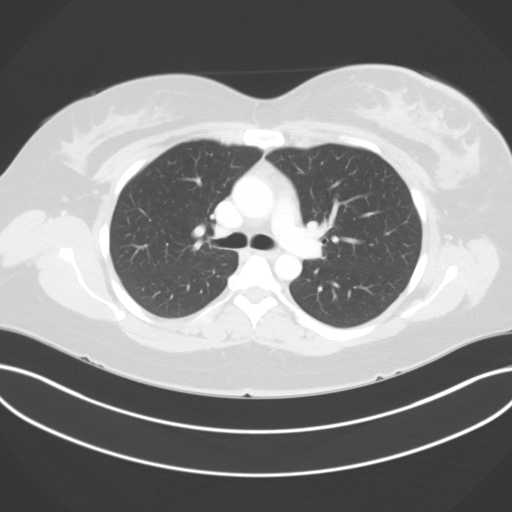
[im 122/134  mediastinal]
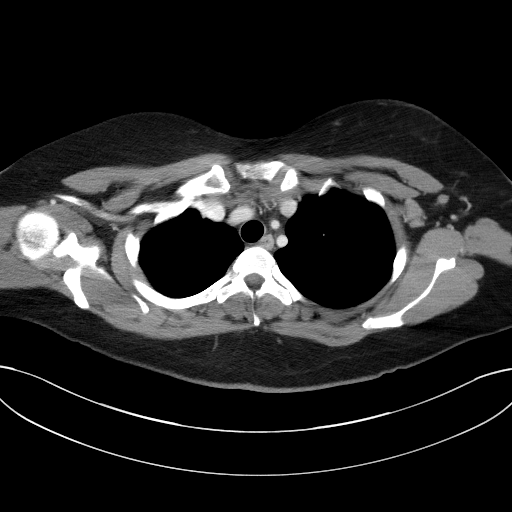
[im 122/134  lung]
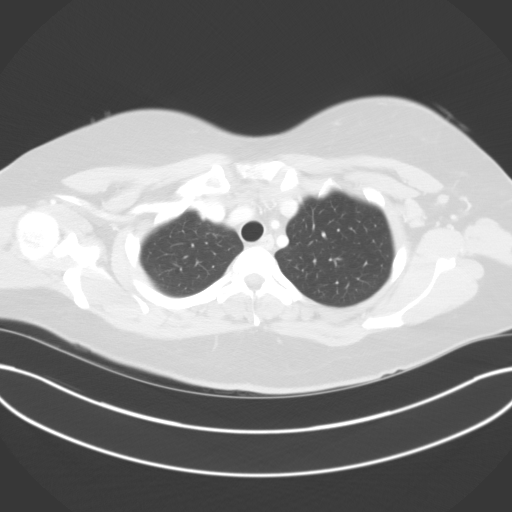

[Series 5: coronals · coronal · 0.82mm/px · 3 of 147 slices shown]
[im 30/147  lung]
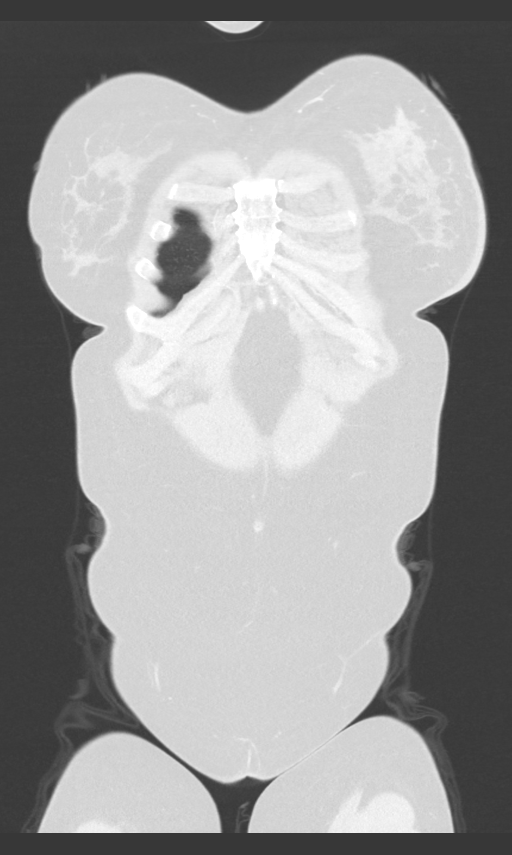
[im 59/147  lung]
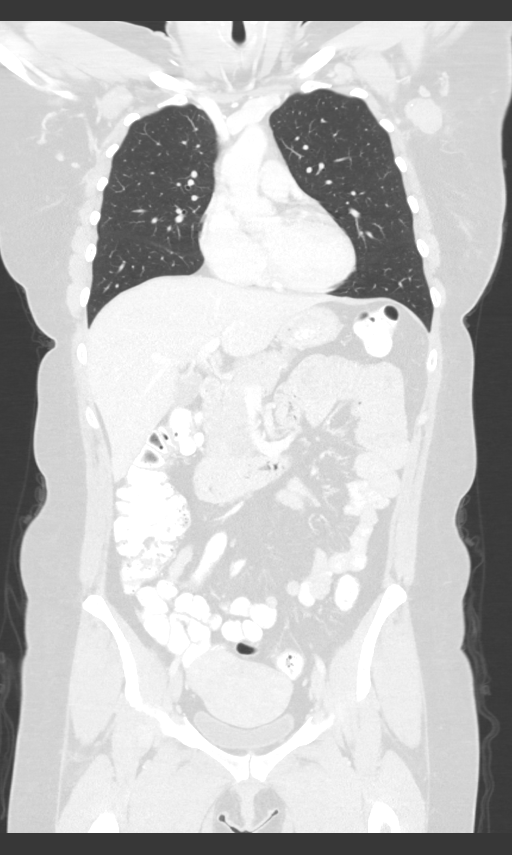
[im 88/147  lung]
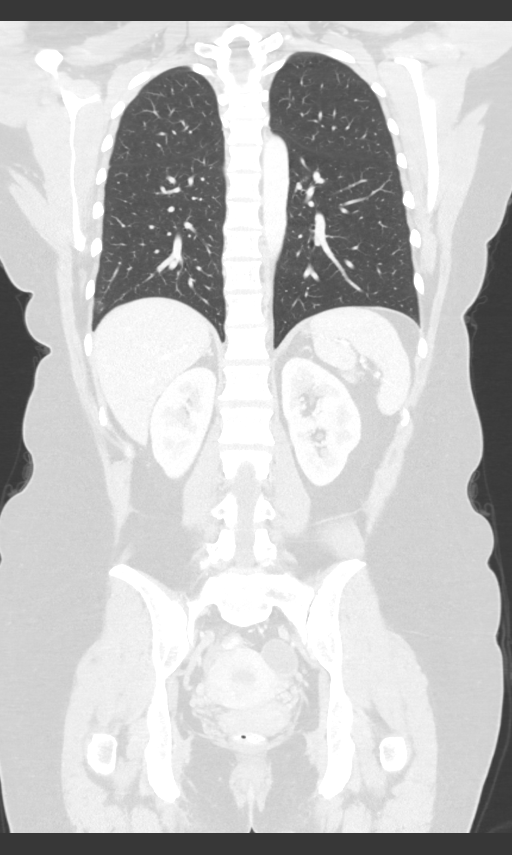

[12 of 36 positions shown; findings below may reference images not displayed]

FINDINGS: CT CHEST FINDINGS

Cardiovascular: Heart size is normal. There is no significant
pericardial fluid, thickening or pericardial calcification. No
atherosclerotic calcifications in the thoracic aorta or the coronary
arteries.

Mediastinum/Nodes: No pathologically enlarged mediastinal or hilar
lymph nodes. Several prominent but nonenlarged left internal mammary
lymph nodes are noted, nonspecific but suspicious given the known
left-sided breast cancer. Esophagus is unremarkable in appearance.
Extensive left axillary and subpectoral lymphadenopathy, with the
largest left axillary lymph node measuring up to 2.6 cm in short
axis (axial image 17 of series 2). Mildly enlarged left
supraclavicular lymph node (axial image 6 of series 2) measuring 1
cm in short axis.

Lungs/Pleura: No suspicious appearing pulmonary nodules or masses
are noted. No acute consolidative airspace disease. No pleural
effusions.

Musculoskeletal: Diffuse skin thickening in the left breast. There
is asymmetry of the fibroglandular tissues in the left breast,
although a discrete mass is not confidently identified by CT imaging
on today's examination. There are no aggressive appearing lytic or
blastic lesions noted in the visualized portions of the skeleton.

CT ABDOMEN PELVIS FINDINGS

Hepatobiliary: No suspicious cystic or solid hepatic lesions. No
intra or extrahepatic biliary ductal dilatation. Gallbladder is
normal in appearance.

Pancreas: No pancreatic mass. No pancreatic ductal dilatation. No
pancreatic or peripancreatic fluid collections or inflammatory
changes.

Spleen: Unremarkable.

Adrenals/Urinary Tract: Bilateral kidneys and adrenal glands are
normal in appearance. No hydroureteronephrosis. Urinary bladder is
normal in appearance.

Stomach/Bowel: Normal appearance of the stomach. No pathologic
dilatation of small bowel or colon. Normal appendix.

Vascular/Lymphatic: No significant atherosclerotic disease, aneurysm
or dissection noted in the abdominal or pelvic vasculature. No
lymphadenopathy noted in the abdomen or pelvis.

Reproductive: Large heterogeneously enhancing mass in the anterior
aspect of the anatomic pelvis (axial image 111 of series 2 and
sagittal image 107 of series 6) measuring 8.9 x 6.4 x 5.3 cm. It is
uncertain whether not this represents an exophytic fibroid, or the
lesion arising from the right adnexa. In the left adnexa there is a
well-defined 4.2 x 3.3 x 3.6 cm low-attenuation lesion, likely to
represent a small cyst.

Other: No significant volume of ascites.  No pneumoperitoneum.

Musculoskeletal: There are no aggressive appearing lytic or blastic
lesions noted in the visualized portions of the skeleton.
IMPRESSION: 1. Diffuse skin thickening in the left breast with left axillary and
subpectoral lymphadenopathy, as well as a mildly enlarged left
supraclavicular lymph node, which likely represents metastatic
lymphadenopathy. No other definite extra nodal metastatic disease
noted elsewhere in the chest, abdomen or pelvis.
2. Large mass in the central anatomic pelvis which is of uncertain
origin, potentially a large exophytic fibroid or a large solid mass
arising from the right ovary. Further evaluation with pelvic
ultrasound is strongly recommended.

## 2020-03-17 IMAGING — CT CT ABD-PELV W/ CM
2 of 4 series · 12 of 36 positions shown, 15 images · IV contrast (OMNIPAQUE)
Comparison: No priors.

CLINICAL DATA: 39-year-old female with history of breast cancer
with lymphadenopathy. Originally diagnosed [DATE]. Patient has
yet to undergo treatment.

EXAM:
CT CHEST, ABDOMEN, AND PELVIS WITH CONTRAST
TECHNIQUE: Multidetector CT imaging of the chest, abdomen and pelvis was
performed following the standard protocol during bolus
administration of intravenous contrast.
CONTRAST:  100mL OMNIPAQUE IOHEXOL 300 MG/ML  SOLN

[Series 2: cap with · axial · 0.79mm/px · z∈[+976,+1526]mm · 9 of 134 slices shown, 12 images]
[im 12/134  mediastinal]
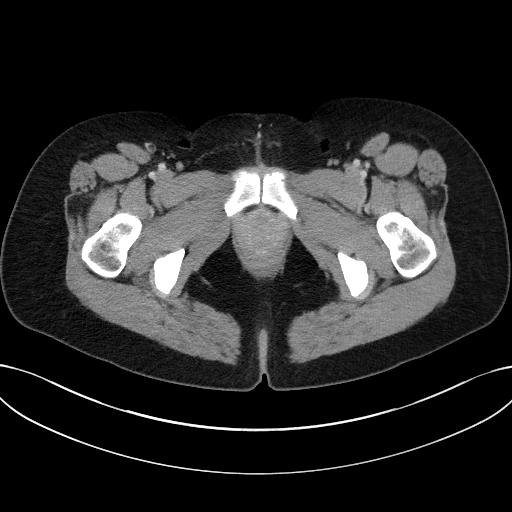
[im 12/134  lung]
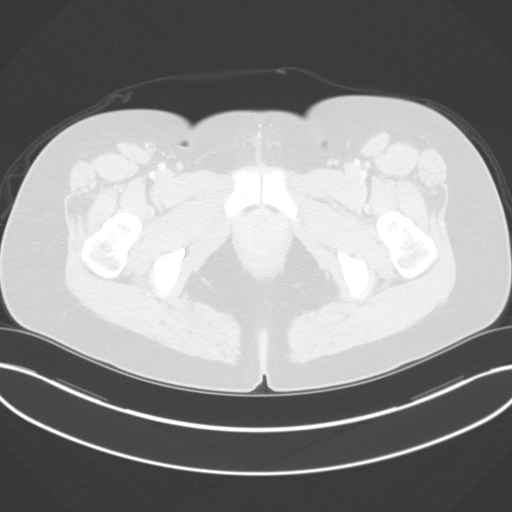
[im 23/134  lung]
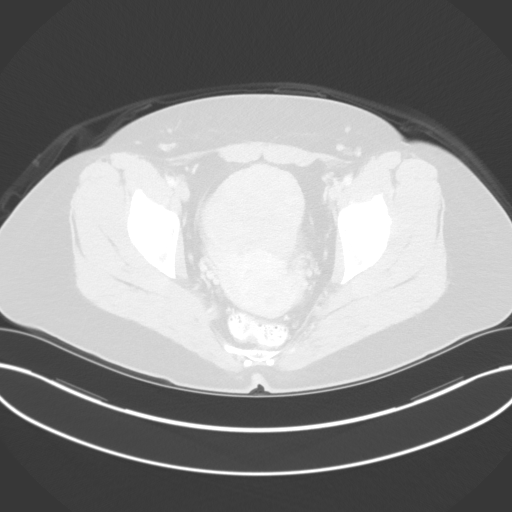
[im 45/134  lung]
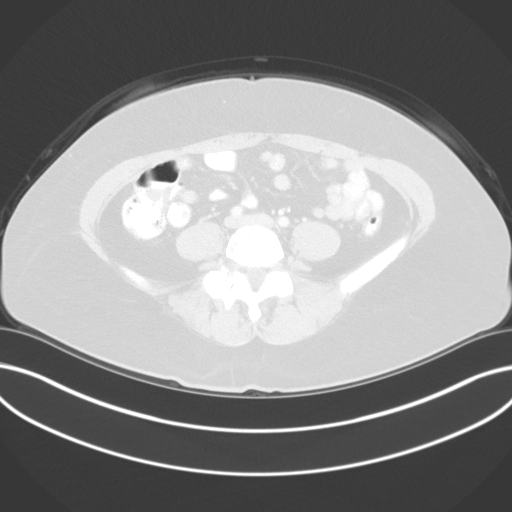
[im 56/134  lung]
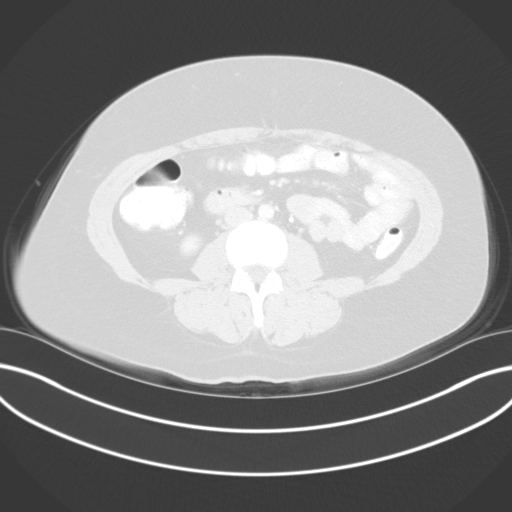
[im 67/134  mediastinal]
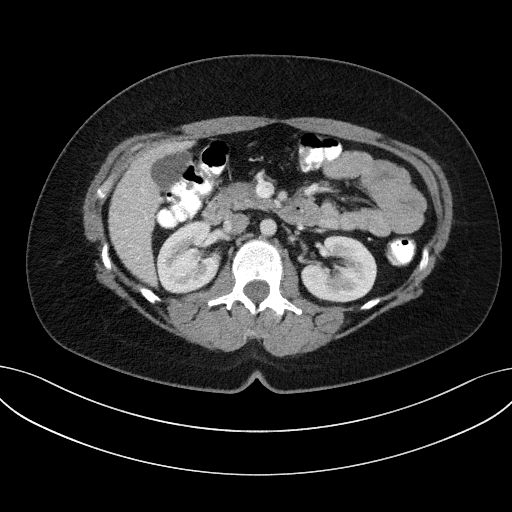
[im 67/134  lung]
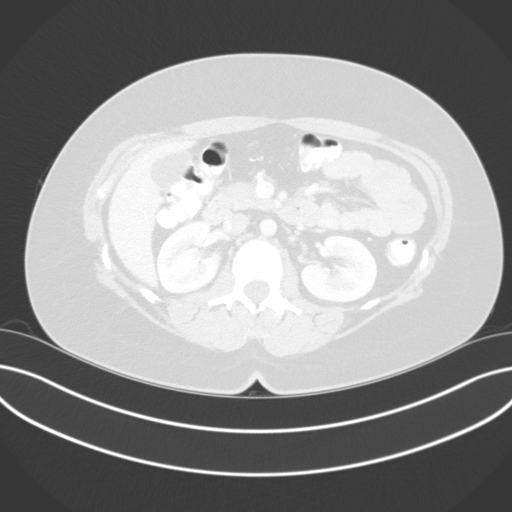
[im 78/134  lung]
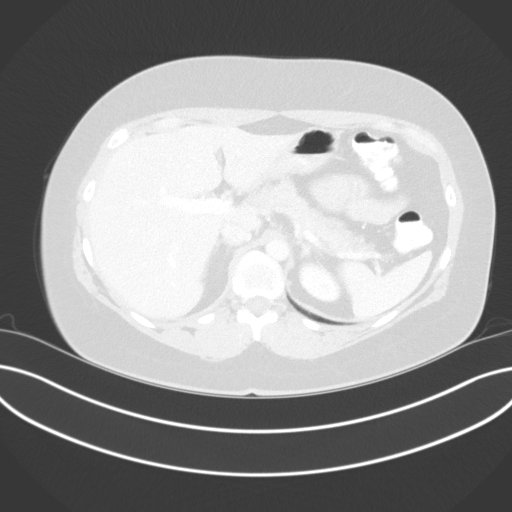
[im 89/134  lung]
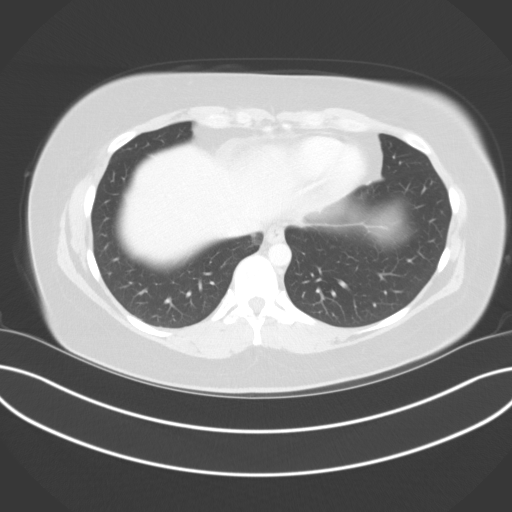
[im 111/134  lung]
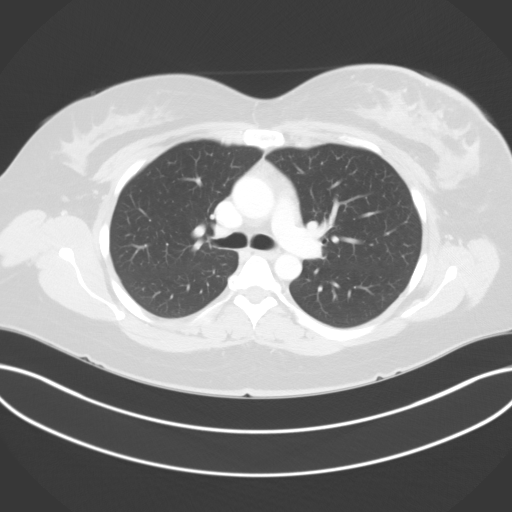
[im 122/134  mediastinal]
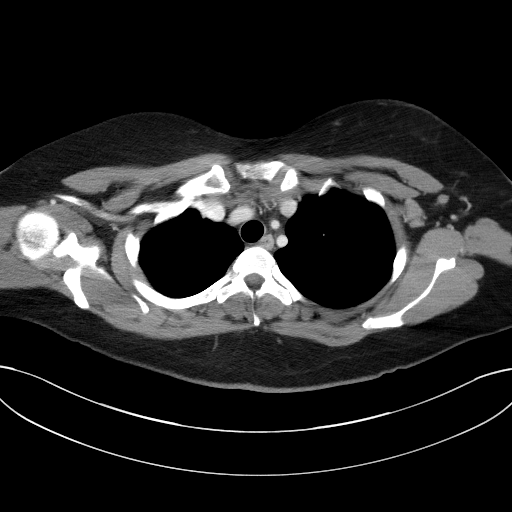
[im 122/134  lung]
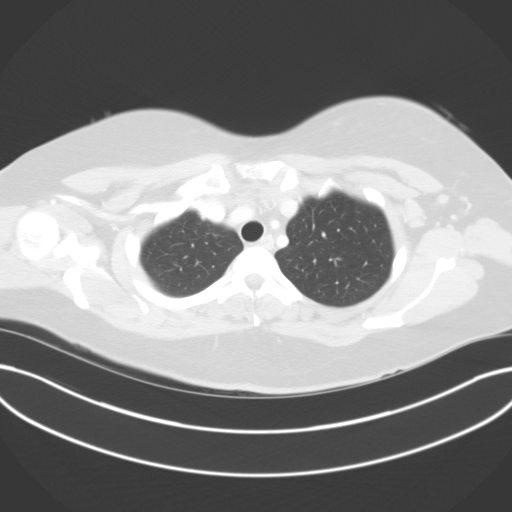

[Series 5: coronals · coronal · 0.82mm/px · 3 of 147 slices shown]
[im 30/147  lung]
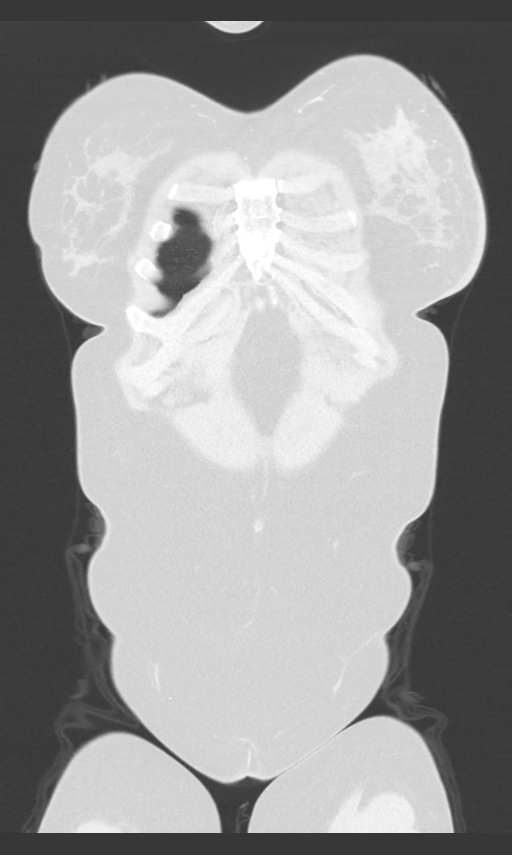
[im 59/147  lung]
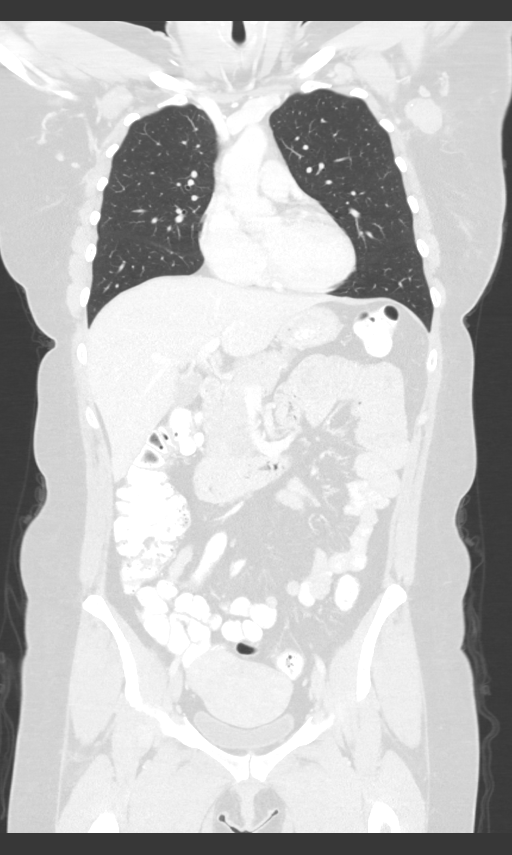
[im 88/147  lung]
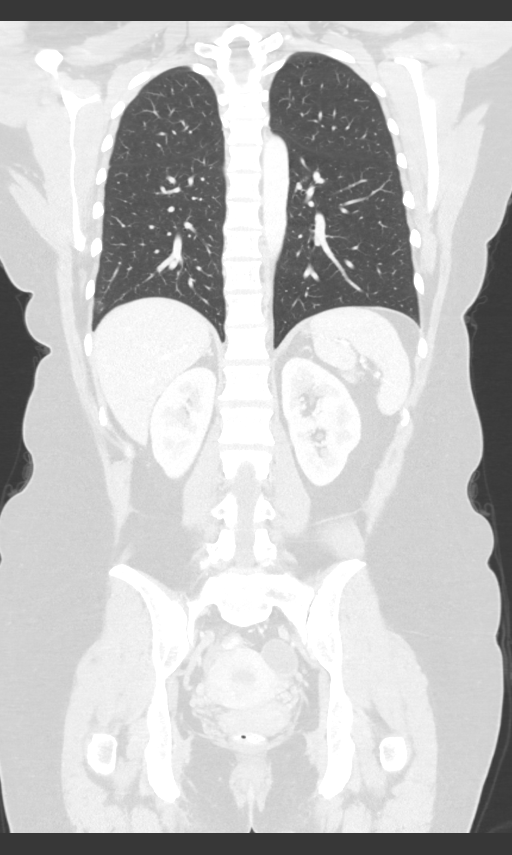

[12 of 36 positions shown; findings below may reference images not displayed]

FINDINGS: CT CHEST FINDINGS

Cardiovascular: Heart size is normal. There is no significant
pericardial fluid, thickening or pericardial calcification. No
atherosclerotic calcifications in the thoracic aorta or the coronary
arteries.

Mediastinum/Nodes: No pathologically enlarged mediastinal or hilar
lymph nodes. Several prominent but nonenlarged left internal mammary
lymph nodes are noted, nonspecific but suspicious given the known
left-sided breast cancer. Esophagus is unremarkable in appearance.
Extensive left axillary and subpectoral lymphadenopathy, with the
largest left axillary lymph node measuring up to 2.6 cm in short
axis (axial image 17 of series 2). Mildly enlarged left
supraclavicular lymph node (axial image 6 of series 2) measuring 1
cm in short axis.

Lungs/Pleura: No suspicious appearing pulmonary nodules or masses
are noted. No acute consolidative airspace disease. No pleural
effusions.

Musculoskeletal: Diffuse skin thickening in the left breast. There
is asymmetry of the fibroglandular tissues in the left breast,
although a discrete mass is not confidently identified by CT imaging
on today's examination. There are no aggressive appearing lytic or
blastic lesions noted in the visualized portions of the skeleton.

CT ABDOMEN PELVIS FINDINGS

Hepatobiliary: No suspicious cystic or solid hepatic lesions. No
intra or extrahepatic biliary ductal dilatation. Gallbladder is
normal in appearance.

Pancreas: No pancreatic mass. No pancreatic ductal dilatation. No
pancreatic or peripancreatic fluid collections or inflammatory
changes.

Spleen: Unremarkable.

Adrenals/Urinary Tract: Bilateral kidneys and adrenal glands are
normal in appearance. No hydroureteronephrosis. Urinary bladder is
normal in appearance.

Stomach/Bowel: Normal appearance of the stomach. No pathologic
dilatation of small bowel or colon. Normal appendix.

Vascular/Lymphatic: No significant atherosclerotic disease, aneurysm
or dissection noted in the abdominal or pelvic vasculature. No
lymphadenopathy noted in the abdomen or pelvis.

Reproductive: Large heterogeneously enhancing mass in the anterior
aspect of the anatomic pelvis (axial image 111 of series 2 and
sagittal image 107 of series 6) measuring 8.9 x 6.4 x 5.3 cm. It is
uncertain whether not this represents an exophytic fibroid, or the
lesion arising from the right adnexa. In the left adnexa there is a
well-defined 4.2 x 3.3 x 3.6 cm low-attenuation lesion, likely to
represent a small cyst.

Other: No significant volume of ascites.  No pneumoperitoneum.

Musculoskeletal: There are no aggressive appearing lytic or blastic
lesions noted in the visualized portions of the skeleton.
IMPRESSION: 1. Diffuse skin thickening in the left breast with left axillary and
subpectoral lymphadenopathy, as well as a mildly enlarged left
supraclavicular lymph node, which likely represents metastatic
lymphadenopathy. No other definite extra nodal metastatic disease
noted elsewhere in the chest, abdomen or pelvis.
2. Large mass in the central anatomic pelvis which is of uncertain
origin, potentially a large exophytic fibroid or a large solid mass
arising from the right ovary. Further evaluation with pelvic
ultrasound is strongly recommended.

## 2020-03-17 MED ORDER — IOHEXOL 300 MG/ML  SOLN
100.0000 mL | Freq: Once | INTRAMUSCULAR | Status: AC | PRN
  Administered 2020-03-17: 100 mL via INTRAVENOUS

## 2020-03-18 ENCOUNTER — Encounter: Payer: Self-pay | Admitting: Hematology

## 2020-03-20 ENCOUNTER — Other Ambulatory Visit: Payer: Self-pay | Admitting: Hematology

## 2020-03-20 ENCOUNTER — Encounter: Payer: Self-pay | Admitting: *Deleted

## 2020-03-20 ENCOUNTER — Ambulatory Visit (HOSPITAL_COMMUNITY)
Admission: RE | Admit: 2020-03-20 | Discharge: 2020-03-20 | Disposition: A | Discharge status: Home / Self Care | Payer: 59 | Source: Ambulatory Visit | Attending: Hematology | Admitting: Hematology

## 2020-03-20 ENCOUNTER — Other Ambulatory Visit: Payer: Self-pay

## 2020-03-20 ENCOUNTER — Ambulatory Visit: Payer: 59 | Attending: General Surgery | Admitting: Physical Therapy

## 2020-03-20 ENCOUNTER — Encounter: Payer: Self-pay | Admitting: Physical Therapy

## 2020-03-20 DIAGNOSIS — Z0189 Encounter for other specified special examinations: Secondary | ICD-10-CM

## 2020-03-20 DIAGNOSIS — C50112 Malignant neoplasm of central portion of left female breast: Secondary | ICD-10-CM | POA: Insufficient documentation

## 2020-03-20 DIAGNOSIS — I082 Rheumatic disorders of both aortic and tricuspid valves: Secondary | ICD-10-CM | POA: Insufficient documentation

## 2020-03-20 DIAGNOSIS — Z171 Estrogen receptor negative status [ER-]: Secondary | ICD-10-CM

## 2020-03-20 DIAGNOSIS — I313 Pericardial effusion (noninflammatory): Secondary | ICD-10-CM | POA: Insufficient documentation

## 2020-03-20 DIAGNOSIS — R293 Abnormal posture: Secondary | ICD-10-CM | POA: Insufficient documentation

## 2020-03-20 LAB — ECHOCARDIOGRAM COMPLETE
Area-P 1/2: 4.06 cm2
S' Lateral: 2.7 cm

## 2020-03-20 NOTE — Patient Instructions (Signed)
            [facility]         [address], [place] [address]         [phone]         Annia Friendly, PT, CLT   After Breast Cancer Class It is recommended you attend the ABC class to be educated on lymphedema risk reduction. This class is free of charge and lasts for 1 hour. It is a 1-time class.      Home exercise Program    Follow up PT: It is recommended you return every 3 months for the first 3 years following surgery to be assessed on the SOZO machine for an L-Dex score. This helps prevent clinically significant lymphedema in 95% of patients. These follow up screens are 15 minute appointments that you are not billed for.

## 2020-03-20 NOTE — Therapy (Signed)
[place], [place], [address] Phone: [phone]   Fax:  [phone]  Physical Therapy Evaluation  Patient Details  Name: Caitlyn Hendricks MRN: [id number] Date of Birth: 1981/06/28 Referring Provider (PT): Donne Hazel   Encounter Date: 03/20/2020   PT End of Session - 03/20/20 1650    Visit Number 1    Number of Visits 2    Date for PT Re-Evaluation 11/06/20    PT Start Time 9767    PT Stop Time 1650    PT Time Calculation (min) 45 min    Activity Tolerance Patient tolerated treatment well    Behavior During Therapy [facility] for tasks assessed/performed           Past Medical History:  Diagnosis Date  . Anxiety   . Depression     History reviewed. No pertinent surgical history.  There were no vitals filed for this visit.    Subjective Assessment - 03/20/20 1606    Subjective They found a lump on my ovary with the CT scan. I don't know what it is.    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 Yes    Pain Score 3     Pain Location Breast    Pain Orientation Left    Pain Descriptors / Indicators Sore    Pain Type Chronic pain    Pain Onset More than a month ago    Pain Frequency Constant    Aggravating Factors  bumping it, moving breast    Pain Relieving Factors holding breast close    Effect of Pain on Daily Activities unable to work              [facility] PT Assessment - 03/20/20 0001      Assessment   Medical Diagnosis L breast cancer    Referring Provider (PT) Donne Hazel    Onset Date/Surgical Date 02/23/20    Hand Dominance Right    Prior Therapy none      Precautions   Precautions Other (comment)    Precaution Comments active cancer      Restrictions   Weight Bearing Restrictions No      Balance Screen   Has the patient fallen in the  past 6 months No    Has the patient had a decrease in activity level because of a fear of falling?  No    Is the patient reluctant to leave their home because of a fear of falling?  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    [employer] Requirements lifting, pulling, tugging, wresting dogs    Leisure walks 3x/wk for 15-30 min      Cognition   Overall Cognitive Status Within Functional Limits for tasks assessed      Posture/Postural Control   Posture/Postural Control Postural limitations      ROM /  Strength   AROM / PROM / Strength AROM      AROM   AROM Assessment Site Shoulder    Right/Left Shoulder Right;Left    Right Shoulder Flexion 180 Degrees    Right Shoulder ABduction 180 Degrees    Right Shoulder Internal Rotation 66 Degrees    Right Shoulder External Rotation 90 Degrees    Left Shoulder Flexion 176 Degrees    Left Shoulder ABduction 176 Degrees    Left Shoulder Internal Rotation 74 Degrees    Left Shoulder External Rotation 85 Degrees             LYMPHEDEMA/ONCOLOGY QUESTIONNAIRE - 03/20/20 0001      Type   Cancer Type left breast cancer      Surgeries   Sentinel Lymph Node Biopsy Date 02/25/20   mid to late august     Lymphedema Assessments   Lymphedema Assessments Upper extremities      Right Upper Extremity Lymphedema   15 cm Proximal to Olecranon Process 33 cm    Olecranon Process 26 cm    15 cm Proximal to Ulnar Styloid Process 25.6 cm    Just Proximal to Ulnar Styloid Process 15.5 cm    Across Hand at [employer] 19.2 cm    At [place] of 2nd Digit 6.2 cm      Left Upper  Extremity Lymphedema   15 cm Proximal to Olecranon Process 33.5 cm    Olecranon Process 26 cm    15 cm Proximal to Ulnar Styloid Process 24.5 cm    Just Proximal to Ulnar Styloid Process 15.5 cm    Across Hand at [employer] 19.4 cm    At [place] of 2nd Digit 6.3 cm           L-DEX FLOWSHEETS - 03/20/20 1600      L-DEX LYMPHEDEMA SCREENING   Measurement Type Unilateral    L-DEX MEASUREMENT EXTREMITY Upper Extremity    POSITION  Standing    DOMINANT SIDE Right    At Risk Side Left    BASELINE SCORE (UNILATERAL) 1.4           The patient was assessed using the L-Dex machine today to produce a lymphedema index baseline score. The patient will be reassessed on a regular basis (typically every 3 months) to obtain new L-Dex scores. If the score is > 6.5 points away from his/her baseline score indicating onset of subclinical lymphedema, it will be recommended to wear a compression garment for 4 weeks, 12 hours per day and then be reassessed. If the score continues to be > 6.5 points from baseline at reassessment, we will initiate lymphedema treatment. Assessing in this manner has a 95% rate of preventing clinically significant lymphedema.        Objective measurements completed on examination: See above findings.               PT Education - 03/20/20 1648    Education Details lymphedema risk reduction, anatomy and physiology of lymphatic system, post op breast exercises, importance of a walking program, ABC class    Person(s) Educated Patient    Methods Explanation;Handout    Comprehension Verbalized understanding               PT Long Term Goals - 03/20/20 1649      PT LONG TERM GOAL #1   Title Pt will return to baseline shoulder ROM measurements post op to allow pt to return to PLOF.  Time 8    Period Months    Status New    Target Date 11/06/20           Breast Clinic Goals - 03/20/20 1649      Patient will be able to verbalize understanding of  pertinent lymphedema risk reduction practices relevant to her diagnosis specifically related to skin care.   Status Achieved      Patient will be able to return demonstrate and/or verbalize understanding of the post-op home exercise program related to regaining shoulder range of motion.   Status Achieved      Patient will be able to verbalize understanding of the importance of attending the postoperative After Breast Cancer Class for further lymphedema risk reduction education and therapeutic exercise.   Status Achieved                 Plan - 03/20/20 1651    Clinical Impression Statement Pt reports to PT with newly diagnosed inflammatory triple negative left breast cancer. She will begin chemo on 03/22/20 and will continue this for 6 months then have a month off then undergo surgery (most likely a mastectomy). Pt reports her cancer has metastasized to her lymph nodes per her biopsy. She will have radiation following that. Instructed pt in post op exercises to begin 1 week after final drain is removed. Baseline ROM and SOZO measurements taken today. She will benefit from a post op PT reassessment to determine needs and in every 3 months for additonal L dex screening to detect subclinical lymphedema.    Stability/Clinical Decision Making Stable/Uncomplicated    Clinical Decision Making Low    Rehab Potential Good    PT Frequency --   eval and 1 f/u post op   PT Duration --   8 months   PT Treatment/Interventions ADLs/Self Care Home Management;Therapeutic exercise;Patient/family education    PT Next Visit Plan reassess and compare to baseline- see if pt signed up for ABC class    PT Home Exercise Plan post op breast exercises    Consulted and Agree with Plan of Care Patient           Patient will benefit from skilled therapeutic intervention in order to improve the following deficits and impairments:  Pain, Postural dysfunction  Visit Diagnosis: Abnormal posture  Malignant  neoplasm of central portion of left female breast, unspecified estrogen receptor status ([place])   Patient will follow up at outpatient cancer rehab 3-4 weeks following surgery.  If the patient requires physical therapy at that time, a specific plan will be dictated and sent to the referring physician for approval. The patient was educated today on appropriate basic range of motion exercises to begin post operatively and the importance of attending the After Breast Cancer class following surgery.  Patient was educated today on lymphedema risk reduction practices as it pertains to recommendations that will benefit the patient immediately following surgery.  She verbalized good understanding.     Problem List Patient Active Problem List   Diagnosis Date Noted  . Cancer of central portion of left female breast [facility]) 03/10/2020    Allyson Sabal Hawley, PT 03/20/2020, 4:56 PM  [place], [place], [address] Phone: [phone]   Fax:  [phone]  Name: Caitlyn Hendricks MRN: [id number] Date of Birth: 10-14-1980

## 2020-03-20 NOTE — Progress Notes (Signed)
  Echocardiogram 2D Echocardiogram has been performed.  Sherard Sutch G Willey Due 03/20/2020, 10:21 AM

## 2020-03-21 ENCOUNTER — Ambulatory Visit (HOSPITAL_COMMUNITY): Payer: 59

## 2020-03-21 ENCOUNTER — Ambulatory Visit (HOSPITAL_BASED_OUTPATIENT_CLINIC_OR_DEPARTMENT_OTHER)
Admission: RE | Admit: 2020-03-21 | Discharge: 2020-03-21 | Disposition: A | Discharge status: Home / Self Care | Payer: 59 | Attending: General Surgery | Admitting: General Surgery

## 2020-03-21 ENCOUNTER — Encounter (HOSPITAL_COMMUNITY)
Admission: RE | Admit: 2020-03-21 | Discharge: 2020-03-21 | Disposition: A | Discharge status: Home / Self Care | Payer: 59 | Source: Ambulatory Visit | Attending: Hematology | Admitting: Hematology

## 2020-03-21 ENCOUNTER — Other Ambulatory Visit: Payer: Self-pay

## 2020-03-21 ENCOUNTER — Ambulatory Visit (HOSPITAL_BASED_OUTPATIENT_CLINIC_OR_DEPARTMENT_OTHER): Payer: 59 | Admitting: Certified Registered Nurse Anesthetist

## 2020-03-21 ENCOUNTER — Encounter (HOSPITAL_BASED_OUTPATIENT_CLINIC_OR_DEPARTMENT_OTHER): Payer: Self-pay | Admitting: General Surgery

## 2020-03-21 ENCOUNTER — Encounter (HOSPITAL_BASED_OUTPATIENT_CLINIC_OR_DEPARTMENT_OTHER)
Admission: RE | Disposition: A | Discharge status: Home / Self Care | Payer: Self-pay | Source: Home / Self Care | Attending: General Surgery

## 2020-03-21 DIAGNOSIS — Z419 Encounter for procedure for purposes other than remedying health state, unspecified: Secondary | ICD-10-CM

## 2020-03-21 DIAGNOSIS — C50112 Malignant neoplasm of central portion of left female breast: Secondary | ICD-10-CM

## 2020-03-21 DIAGNOSIS — C50912 Malignant neoplasm of unspecified site of left female breast: Secondary | ICD-10-CM | POA: Insufficient documentation

## 2020-03-21 DIAGNOSIS — C773 Secondary and unspecified malignant neoplasm of axilla and upper limb lymph nodes: Secondary | ICD-10-CM | POA: Insufficient documentation

## 2020-03-21 DIAGNOSIS — M1990 Unspecified osteoarthritis, unspecified site: Secondary | ICD-10-CM | POA: Insufficient documentation

## 2020-03-21 DIAGNOSIS — Z87891 Personal history of nicotine dependence: Secondary | ICD-10-CM | POA: Diagnosis not present

## 2020-03-21 HISTORY — PX: PORTACATH PLACEMENT: SHX2246

## 2020-03-21 LAB — POCT PREGNANCY, URINE: Preg Test, Ur: NEGATIVE

## 2020-03-21 IMAGING — RF DG FLUORO GUIDE CV LINE
1 series · 1 of 1 positions shown · non-contrast
Comparison: None.

FLUOROSCOPY TIME:  21.4 seconds

CLINICAL DATA: Port-A-Cath placement in 39-year-old female
performed in the operating room.
INDICATION: Port-A-Cath placement in 39-year-old female performed in the
operating room.

EXAM:
CENTRAL VENOUS CATHETER WITH FLUOROSCOPY

[Series 1: run · 1 of 1 slices shown]
[im 1/1]
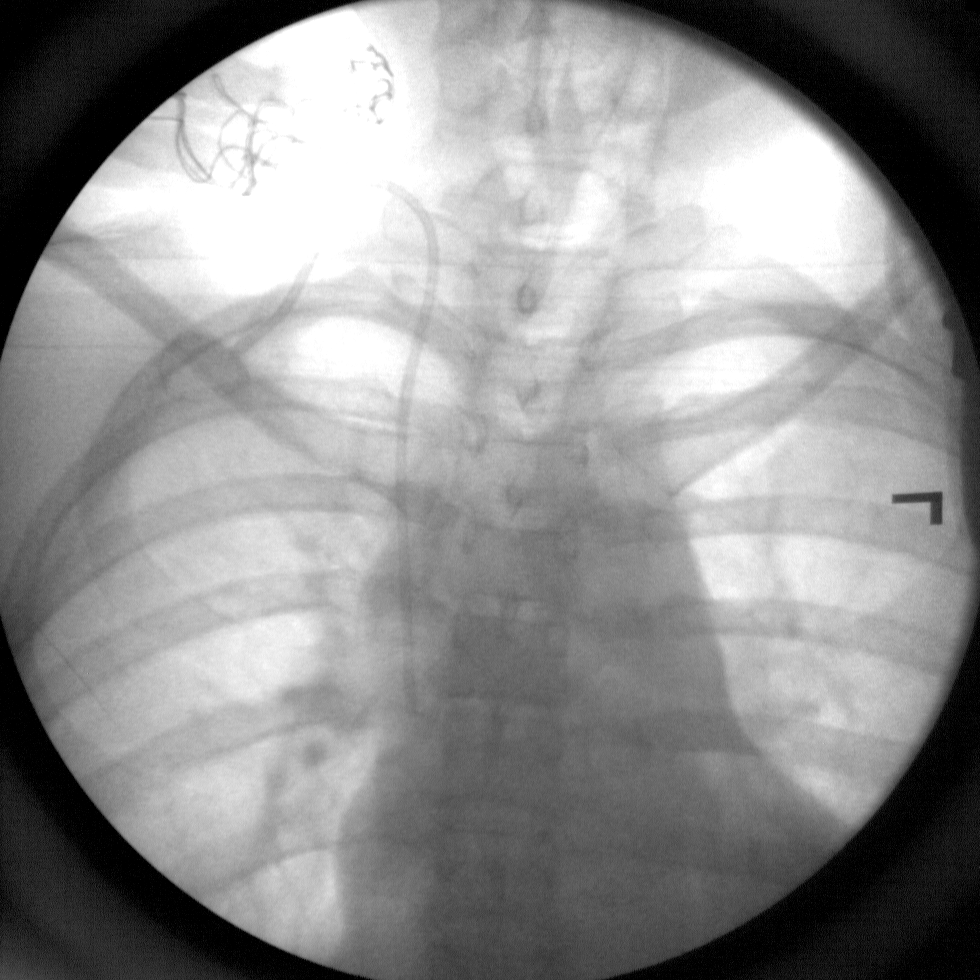

[1 of 1 positions shown; findings below may reference images not displayed]

FINDINGS: Single fluoroscopic spot image from placement of right chest wall
Port-A-Cath with catheter appearing to enter the internal jugular
vein. The tip terminates near the cavoatrial junction. No evidence
of pneumothorax. MORROBEL projects over the right neck.
IMPRESSION: Intraoperative single fluoroscopic image demonstrates a right
internal jugular Port-A-Cath with the catheter tip near the
cavoatrial junction.

## 2020-03-21 IMAGING — NM NM BONE WHOLE BODY
4 series · 4 of 4 positions shown · non-contrast
Comparison: CT chest, abdomen, and pelvis [DATE]

CLINICAL DATA: Breast carcinoma

EXAM:
NUCLEAR MEDICINE WHOLE BODY BONE SCAN
TECHNIQUE: Whole body anterior and posterior images were obtained approximately
3 hours after intravenous injection of radiopharmaceutical.
RADIOPHARMACEUTICALS:  21.4 mCi [4E] MDP IV

[Series 1: whole body · 2.66mm/px · 1 of 1 slices shown (1 of 2)]
[im 1/1]
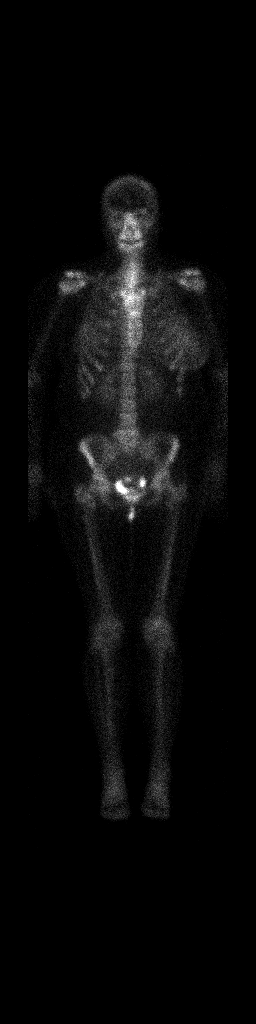

[Series 1: wbr_bone_40 whole body · 2.66mm/px · 1 of 1 slices shown (1 of 2)]
[im 1/1]
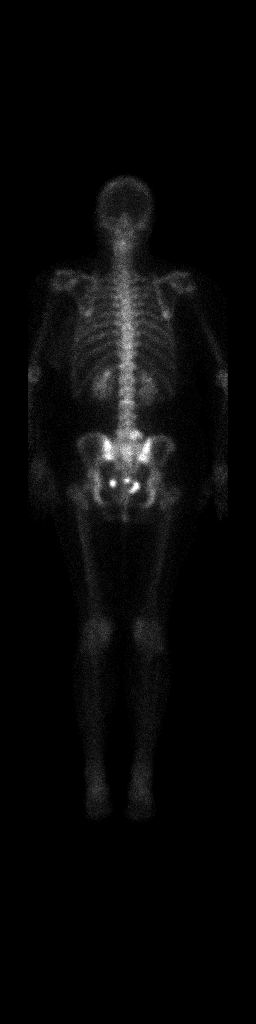

[Series 1: whole body · 2.66mm/px · 1 of 1 slices shown (2 of 2)]
[im 1/1]
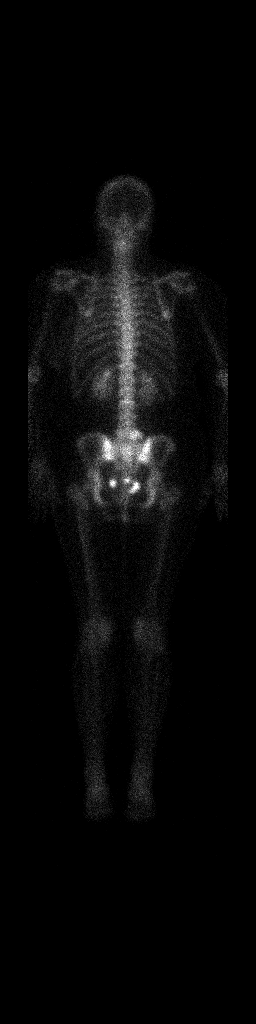

[Series 1: wbr_bone_40 whole body · 2.66mm/px · 1 of 1 slices shown (2 of 2)]
[im 1/1]
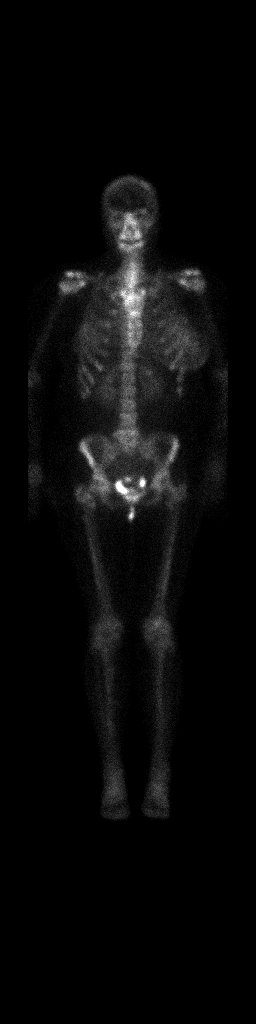

[4 of 4 positions shown; findings below may reference images not displayed]

FINDINGS: Increased radiotracer uptake is noted in the L5 region. Recent CT
shows arthropathic change in this area. Other bony structures show
normal radiotracer uptake. There are patchy areas of increased
opacity in the pelvis at the site of a mass seen by CT in the
pelvis. Note that this mass on CT does not show appreciable
calcification.

Flank kidneys noted bilaterally.
IMPRESSION: Apparent arthropathy at L5. No bony metastatic disease is
demonstrable on this study. Scattered foci of abnormal uptake in a
pelvic mass is of uncertain etiology given absence of calcification
in this mass by CT. This mass compresses the urinary bladder. It is
possible that some of the increased uptake in this area actually
represents physiologic uptake within the bladder.

Kidneys noted in flank positions bilaterally.

## 2020-03-21 SURGERY — INSERTION, TUNNELED CENTRAL VENOUS DEVICE, WITH PORT
Anesthesia: General | Site: Chest | Laterality: Right

## 2020-03-21 MED ORDER — HEPARIN (PORCINE) IN NACL 2-0.9 UNITS/ML
INTRAMUSCULAR | Status: AC | PRN
  Administered 2020-03-21: 1 via INTRAVENOUS

## 2020-03-21 MED ORDER — OXYCODONE HCL 5 MG PO TABS
5.0000 mg | ORAL_TABLET | Freq: Once | ORAL | Status: AC | PRN
  Administered 2020-03-21: 5 mg via ORAL

## 2020-03-21 MED ORDER — CEFAZOLIN SODIUM-DEXTROSE 2-4 GM/100ML-% IV SOLN
INTRAVENOUS | Status: AC
  Filled 2020-03-21: qty 100

## 2020-03-21 MED ORDER — HYDROMORPHONE HCL 1 MG/ML IJ SOLN
0.2500 mg | INTRAMUSCULAR | Status: DC | PRN
  Administered 2020-03-21 (×2): 0.5 mg via INTRAVENOUS

## 2020-03-21 MED ORDER — ACETAMINOPHEN 500 MG PO TABS
ORAL_TABLET | ORAL | Status: AC
  Filled 2020-03-21: qty 2

## 2020-03-21 MED ORDER — EPHEDRINE 5 MG/ML INJ
INTRAVENOUS | Status: AC
  Filled 2020-03-21: qty 20

## 2020-03-21 MED ORDER — AMISULPRIDE (ANTIEMETIC) 5 MG/2ML IV SOLN: 10.0000 mg | Freq: Once | INTRAVENOUS | Status: DC | PRN

## 2020-03-21 MED ORDER — OXYCODONE HCL 5 MG/5ML PO SOLN: 5.0000 mg | Freq: Once | ORAL | Status: AC | PRN

## 2020-03-21 MED ORDER — CEFAZOLIN SODIUM-DEXTROSE 2-4 GM/100ML-% IV SOLN: 2.0000 g | INTRAVENOUS | Status: DC

## 2020-03-21 MED ORDER — MEPERIDINE HCL 25 MG/ML IJ SOLN: 6.2500 mg | INTRAMUSCULAR | Status: DC | PRN

## 2020-03-21 MED ORDER — ACETAMINOPHEN 500 MG PO TABS: 1000.0000 mg | ORAL_TABLET | ORAL | Status: DC

## 2020-03-21 MED ORDER — ENSURE PRE-SURGERY PO LIQD: 296.0000 mL | Freq: Once | ORAL | Status: DC

## 2020-03-21 MED ORDER — ONDANSETRON HCL 4 MG/2ML IJ SOLN
INTRAMUSCULAR | Status: AC
  Filled 2020-03-21: qty 2

## 2020-03-21 MED ORDER — LACTATED RINGERS IV SOLN: INTRAVENOUS | Status: DC

## 2020-03-21 MED ORDER — HYDROMORPHONE HCL 1 MG/ML IJ SOLN
INTRAMUSCULAR | Status: AC
  Filled 2020-03-21: qty 0.5

## 2020-03-21 MED ORDER — HEPARIN SOD (PORK) LOCK FLUSH 100 UNIT/ML IV SOLN
INTRAVENOUS | Status: AC
  Filled 2020-03-21: qty 5

## 2020-03-21 MED ORDER — LIDOCAINE HCL (CARDIAC) PF 100 MG/5ML IV SOSY
PREFILLED_SYRINGE | INTRAVENOUS | Status: DC | PRN
  Administered 2020-03-21: 80 mg via INTRAVENOUS

## 2020-03-21 MED ORDER — PROMETHAZINE HCL 25 MG/ML IJ SOLN: 6.2500 mg | INTRAMUSCULAR | Status: DC | PRN

## 2020-03-21 MED ORDER — ONDANSETRON HCL 4 MG/2ML IJ SOLN
INTRAMUSCULAR | Status: DC | PRN
  Administered 2020-03-21: 4 mg via INTRAVENOUS

## 2020-03-21 MED ORDER — OXYCODONE HCL 5 MG PO TABS
5.0000 mg | ORAL_TABLET | Freq: Once | ORAL | Status: AC
  Administered 2020-03-21: 5 mg via ORAL

## 2020-03-21 MED ORDER — BUPIVACAINE HCL (PF) 0.25 % IJ SOLN
INTRAMUSCULAR | Status: DC | PRN
  Administered 2020-03-21: 5 mL

## 2020-03-21 MED ORDER — OXYCODONE HCL 5 MG PO TABS
ORAL_TABLET | ORAL | Status: AC
  Filled 2020-03-21: qty 1

## 2020-03-21 MED ORDER — PROPOFOL 10 MG/ML IV BOLUS
INTRAVENOUS | Status: DC | PRN
  Administered 2020-03-21: 200 mg via INTRAVENOUS

## 2020-03-21 MED ORDER — HEPARIN SOD (PORK) LOCK FLUSH 100 UNIT/ML IV SOLN
INTRAVENOUS | Status: DC | PRN
  Administered 2020-03-21: 400 [IU]

## 2020-03-21 MED ORDER — DEXAMETHASONE SODIUM PHOSPHATE 4 MG/ML IJ SOLN
INTRAMUSCULAR | Status: DC | PRN
  Administered 2020-03-21: 4 mg via INTRAVENOUS

## 2020-03-21 MED ORDER — MIDAZOLAM HCL 2 MG/2ML IJ SOLN
INTRAMUSCULAR | Status: AC
  Filled 2020-03-21: qty 2

## 2020-03-21 MED ORDER — LIDOCAINE 2% (20 MG/ML) 5 ML SYRINGE
INTRAMUSCULAR | Status: AC
  Filled 2020-03-21: qty 5

## 2020-03-21 MED ORDER — HEPARIN (PORCINE) IN NACL 1000-0.9 UT/500ML-% IV SOLN
INTRAVENOUS | Status: AC
  Filled 2020-03-21: qty 500

## 2020-03-21 MED ORDER — PROPOFOL 10 MG/ML IV BOLUS
INTRAVENOUS | Status: AC
  Filled 2020-03-21: qty 20

## 2020-03-21 MED ORDER — FENTANYL CITRATE (PF) 100 MCG/2ML IJ SOLN
INTRAMUSCULAR | Status: AC
  Filled 2020-03-21: qty 2

## 2020-03-21 MED ORDER — MIDAZOLAM HCL 5 MG/5ML IJ SOLN
INTRAMUSCULAR | Status: DC | PRN
  Administered 2020-03-21: 2 mg via INTRAVENOUS

## 2020-03-21 MED ORDER — FENTANYL CITRATE (PF) 100 MCG/2ML IJ SOLN
INTRAMUSCULAR | Status: DC | PRN
  Administered 2020-03-21: 100 ug via INTRAVENOUS

## 2020-03-21 MED ORDER — TECHNETIUM TC 99M MEDRONATE IV KIT
20.0000 | PACK | Freq: Once | INTRAVENOUS | Status: AC | PRN
  Administered 2020-03-21: 20 via INTRAVENOUS

## 2020-03-21 SURGICAL SUPPLY — 53 items
ADH SKN CLS APL DERMABOND .7 (GAUZE/BANDAGES/DRESSINGS) ×1
APL PRP STRL LF DISP 70% ISPRP (MISCELLANEOUS) ×1
APL SKNCLS STERI-STRIP NONHPOA (GAUZE/BANDAGES/DRESSINGS) ×1
BAG DECANTER FOR FLEXI CONT (MISCELLANEOUS) ×2 IMPLANT
BENZOIN TINCTURE PRP APPL 2/3 (GAUZE/BANDAGES/DRESSINGS) ×2 IMPLANT
BLADE SURG 11 STRL SS (BLADE) ×2 IMPLANT
BLADE SURG 15 STRL LF DISP TIS (BLADE) ×1 IMPLANT
BLADE SURG 15 STRL SS (BLADE) ×2
CANISTER SUCT 1200ML W/VALVE (MISCELLANEOUS) IMPLANT
CHLORAPREP W/TINT 26 (MISCELLANEOUS) ×2 IMPLANT
COVER BACK TABLE 60X90IN (DRAPES) ×2 IMPLANT
COVER MAYO STAND STRL (DRAPES) ×2 IMPLANT
COVER PROBE 5X48 (MISCELLANEOUS)
COVER WAND RF STERILE (DRAPES) IMPLANT
DECANTER SPIKE VIAL GLASS SM (MISCELLANEOUS) IMPLANT
DERMABOND ADVANCED (GAUZE/BANDAGES/DRESSINGS) ×1
DERMABOND ADVANCED .7 DNX12 (GAUZE/BANDAGES/DRESSINGS) ×1 IMPLANT
DRAPE C-ARM 42X72 X-RAY (DRAPES) ×2 IMPLANT
DRAPE LAPAROSCOPIC ABDOMINAL (DRAPES) ×2 IMPLANT
DRAPE UTILITY XL STRL (DRAPES) ×2 IMPLANT
DRSG TEGADERM 4X4.75 (GAUZE/BANDAGES/DRESSINGS) ×4 IMPLANT
ELECT COATED BLADE 2.86 ST (ELECTRODE) ×2 IMPLANT
ELECT REM PT RETURN 9FT ADLT (ELECTROSURGICAL) ×2
ELECTRODE REM PT RTRN 9FT ADLT (ELECTROSURGICAL) ×1 IMPLANT
GAUZE SPONGE 4X4 12PLY STRL LF (GAUZE/BANDAGES/DRESSINGS) ×2 IMPLANT
GLOVE BIO SURGEON STRL SZ7 (GLOVE) ×4 IMPLANT
GLOVE BIOGEL PI IND STRL 7.0 (GLOVE) ×1 IMPLANT
GLOVE BIOGEL PI IND STRL 7.5 (GLOVE) ×2 IMPLANT
GLOVE BIOGEL PI INDICATOR 7.0 (GLOVE) ×1
GLOVE BIOGEL PI INDICATOR 7.5 (GLOVE) ×2
GOWN STRL REUS W/ TWL LRG LVL3 (GOWN DISPOSABLE) ×2 IMPLANT
GOWN STRL REUS W/TWL LRG LVL3 (GOWN DISPOSABLE) ×4
IV KIT MINILOC 20X1 SAFETY (NEEDLE) IMPLANT
KIT CVR 48X5XPRB PLUP LF (MISCELLANEOUS) IMPLANT
KIT PORT POWER 8FR ISP CVUE (Port) ×2 IMPLANT
NDL SAFETY ECLIPSE 18X1.5 (NEEDLE) IMPLANT
NEEDLE HYPO 18GX1.5 SHARP (NEEDLE)
NEEDLE HYPO 25X1 1.5 SAFETY (NEEDLE) ×2 IMPLANT
PACK BASIN DAY SURGERY FS (CUSTOM PROCEDURE TRAY) ×2 IMPLANT
PENCIL SMOKE EVACUATOR (MISCELLANEOUS) ×2 IMPLANT
SLEEVE SCD COMPRESS KNEE MED (MISCELLANEOUS) ×2 IMPLANT
STRIP CLOSURE SKIN 1/2X4 (GAUZE/BANDAGES/DRESSINGS) ×2 IMPLANT
SUT MNCRL AB 4-0 PS2 18 (SUTURE) ×2 IMPLANT
SUT PROLENE 2 0 SH DA (SUTURE) ×2 IMPLANT
SUT SILK 2 0 TIES 17X18 (SUTURE)
SUT SILK 2-0 18XBRD TIE BLK (SUTURE) IMPLANT
SUT VIC AB 3-0 SH 27 (SUTURE) ×2
SUT VIC AB 3-0 SH 27X BRD (SUTURE) ×1 IMPLANT
SYR 5ML LUER SLIP (SYRINGE) ×2 IMPLANT
SYR CONTROL 10ML LL (SYRINGE) ×2 IMPLANT
TOWEL GREEN STERILE FF (TOWEL DISPOSABLE) ×2 IMPLANT
TUBE CONNECTING 20X1/4 (TUBING) IMPLANT
YANKAUER SUCT BULB TIP NO VENT (SUCTIONS) IMPLANT

## 2020-03-21 NOTE — Interval H&P Note (Signed)
History and Physical Interval Note:  03/21/2020 2:29 PM  Caitlyn Hendricks  has presented today for surgery, with the diagnosis of breast cancer.  The various methods of treatment have been discussed with the patient and family. After consideration of risks, benefits and other options for treatment, the patient has consented to  Procedure(s): INSERTION PORT-A-CATH WITH ULTRASOUND GUIDANCE (N/A) as a surgical intervention.  The patient's history has been reviewed, patient examined, no change in status, stable for surgery.  I have reviewed the patient's chart and labs.  Questions were answered to the patient's satisfaction.     [name]

## 2020-03-21 NOTE — Discharge Instructions (Signed)

## 2020-03-21 NOTE — Anesthesia Procedure Notes (Signed)
Procedure Name: LMA Insertion Date/Time: 03/21/2020 2:49 PM Performed by: Signe Colt, CRNA Pre-anesthesia Checklist: Patient identified, Emergency Drugs available, Suction available and Patient being monitored Patient Re-evaluated:Patient Re-evaluated prior to induction Oxygen Delivery Method: Circle system utilized Preoxygenation: Pre-oxygenation with 100% oxygen Induction Type: IV induction Ventilation: Mask ventilation without difficulty LMA: LMA inserted LMA Size: 4.0 Number of attempts: 1 Airway Equipment and Method: Bite block Placement Confirmation: positive ETCO2 Tube secured with: Tape Dental Injury: Teeth and Oropharynx as per pre-operative assessment

## 2020-03-21 NOTE — Anesthesia Preprocedure Evaluation (Signed)
Anesthesia Evaluation  Patient identified by MRN, date of birth, ID band Patient awake    Reviewed: Allergy & Precautions, NPO status , Patient's Chart, lab work & pertinent test results  Airway Mallampati: II  TM Distance: >3 FB Neck ROM: Full    Dental no notable dental hx.    Pulmonary neg pulmonary ROS,    Pulmonary exam normal breath sounds clear to auscultation       Cardiovascular negative cardio ROS Normal cardiovascular exam Rhythm:Regular Rate:Normal     Neuro/Psych Anxiety Depression negative neurological ROS  negative psych ROS   GI/Hepatic negative GI ROS, Neg liver ROS,   Endo/Other  negative endocrine ROS  Renal/GU negative Renal ROS  negative genitourinary   Musculoskeletal negative musculoskeletal ROS (+)   Abdominal (+) + obese,   Peds negative pediatric ROS (+)  Hematology negative hematology ROS (+)   Anesthesia Other Findings Breast Cancer  Reproductive/Obstetrics negative OB ROS                             Anesthesia Physical Anesthesia Plan  ASA: III  Anesthesia Plan: General   Post-op Pain Management:    Induction: Intravenous  PONV Risk Score and Plan: 3 and Ondansetron, Dexamethasone, Midazolam and Treatment may vary due to age or medical condition  Airway Management Planned: LMA  Additional Equipment:   Intra-op Plan:   Post-operative Plan: Extubation in OR  Informed Consent: I have reviewed the patients History and Physical, chart, labs and discussed the procedure including the risks, benefits and alternatives for the proposed anesthesia with the patient or authorized representative who has indicated his/her understanding and acceptance.     Dental advisory given  Plan Discussed with: CRNA  Anesthesia Plan Comments:         Anesthesia Quick Evaluation

## 2020-03-21 NOTE — Op Note (Signed)
Preoperative diagnosis: inflammatory breast cancer, left Postoperative diagnosis: Same as above Procedure: Right internal jugular port placement with ultrasound guidance Surgeon: Dr. Serita Grammes Anesthesia: General Complications: None Drains: None Estimated blood loss: Minimal Specimens: None Special count was correct at completion Disposition to recovery stable condition  Indications: 45 yof with several months of left breast pain and enlargement. this breast is larger and has to wear larger bra if she can tolerate it. it has been warm and heavy. tender to touch. it has become red in past couple weeks also. no dc. she also noted mass during last few weeks as well. she has fh in an aunt. she had mm with c density breasts. there is 2x1x2.6 cm mass at 12 oclock and skin thickening. US shows two adjacent irregular masses measuring 2.1x0.9x1.1 cm and other is 1.1x1.4x0.6 cm that total span is 3.6 cm. there are four enlarged subpectoral nodes and at least five enlarged ax nodes. 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 Discussed port palcement.   Procedure: After informed consent was obtained the patient was taken the operating room.  She was given antibiotics.  SCDs were in place.  She was placed under general anesthesia with an LMA.  She was prepped and draped in the standard sterile surgical fashion.  Surgical timeout was then performed.  I identified the right internal jugular vein by ultrasound.  I then made a small nick in the skin.  I accessed the jugular vein with the needle.  I passed the wire.  I confirmed that the wire was in the jugular vein both by ultrasound and with fluoroscopy.  I then infiltrated Marcaine below the clavicle.  I made incision and developed a pocket for the port.  I tunneled between the 2 sites and placed the line there.  I then placed the peel-away sheath and dilator over the wire.  I used fluoroscopy to follow this to  the cavoatrial junction.  I then remove the dilator and wire assembly.  I then placed the line through the peel-away sheath.  The peel-away sheath was then removed.  The line was backed up to be in the superior vena cava.  I then connected this to the port.  The port was placed in the pocket and secured with 2-0 Prolene suture.  This aspirated blood and flushed easily.  I packed this with heparin.  I then closed this with 3-0 Vicryl for Monocryl.  Glue were placed on the incisions.  I accessed this for chemotherapy tomorrow.  She tolerated this well was extubated and transferred to recovery stable.

## 2020-03-21 NOTE — Transfer of Care (Signed)
Immediate Anesthesia Transfer of Care Note  Patient: Caitlyn Hendricks  Procedure(s) Performed: INSERTION PORT-A-CATH WITH ULTRASOUND GUIDANCE (Right Chest)  Patient Location: PACU  Anesthesia Type:General  Level of Consciousness: awake, alert , oriented and patient cooperative  Airway & Oxygen Therapy: Patient Spontanous Breathing and Patient connected to face mask oxygen  Post-op Assessment: Report given to RN and Post -op Vital signs reviewed and stable  Post vital signs: Reviewed and stable  Last Vitals:  Vitals Value Taken Time  BP    Temp    Pulse 98 03/21/20 1536  Resp 16 03/21/20 1536  SpO2 100 % 03/21/20 1536  Vitals shown include unvalidated device data.  Last Pain:  Vitals:   03/21/20 1422  TempSrc: Oral  PainSc: 0-No pain      Patients Stated Pain Goal: 3 (43/83/81 8403)  Complications: No complications documented.

## 2020-03-21 NOTE — Progress Notes (Signed)
[place]   [phone] [phone]   Clinic Follow up Note   Patient Care Team: [name], MD as PCP - General (Family Medicine) Mauro Kaufmann, RN as Oncology Nurse Navigator Rockwell Germany, RN as Oncology Nurse Navigator 03/22/2020  CHIEF COMPLAINT: Follow-up left breast cancer  SUMMARY OF ONCOLOGIC HISTORY: Oncology History Overview Note  Cancer Staging Cancer of central portion of left female breast [facility]) Staging form: Breast, AJCC 8th Edition - Clinical: No stage assigned - Unsigned    Cancer of central portion of left female breast ([place])  02/23/2020 Mammogram   Diagnostic Mammogram 02/23/20  IMPRESSION The 2x1x2.6cm irregular mass in teh left breast at 12:00 posiiton middle depth is highly suspicious of malignancy. An [place] is recommended for further evaluation and biopsy planning purposes.   02/23/2020 Breast US   [name] 02/23/20  IMPRESSION 2 adjacent spiculated masses in the left brast at 12:00 position 3 cm from the nipple (2.1x0.9x1.1cm and 1.1x1.4x0.5cm) is suggestive of malignancy.   Multiple abnormal left subpectoral and left axillary nodes measuring 3.3x2.1 cm concerning for metastatic adenopathy.    Left breast skin thickening and edema may be secondary congestive edema due to extensive axillary adenopathy.    03/08/2020 Initial Biopsy   Diagnosis 1.Breast, left, needle core biopsy, 12:00 position, 3cmfn -INVASIVE DUCTAL CARCINOMA -SEE COMMENT  2. Lymph node, needle/core biopsy, left axilla -METASTATIC CARCINOMA INVOLVING A LYMPH NODE  -LYMPHOVASCULAR SPACE INVASION PRESENT   Microscopic Comment  1.Based on the biopsy the carcinoma appears Nottingham Grade 3 or 3 and measures 1 cm in the greatest linear extent.    03/08/2020 Receptors her2   ER- Negative 0% PR - Negative 0% HER2 - Negative  KI 67 - 80%    03/08/2020 Cancer Staging   Staging form: Breast, AJCC 8th Edition - Clinical stage from  03/08/2020: Stage IIIB (cT2, cN1, cM0, G3, ER-, PR-, HER2-) - Signed by Truitt Merle, MD on 03/10/2020   03/10/2020 Initial Diagnosis   Cancer of central portion of left female breast ([place])   03/16/2020 Breast MRI   IMPRESSION: 1. 8.1 x 7.8 x 6.6 cm biopsy proven invasive ductal carcinoma in the central right breast, involving 3 quadrants. 2. 3.0 x 1.7 x 1.1 cm satellite mass more inferiorly in lower inner quadrant of the left breast, compatible with additional malignancy. 3. Metastatic level 1 and level 2 left axillary lymph nodes. 4. No evidence of malignancy on the right.   03/17/2020 Imaging   IMPRESSION: CT CAP w contrast  1. Diffuse skin thickening in the left breast with left axillary and subpectoral lymphadenopathy, as well as a mildly enlarged left supraclavicular lymph node, which likely represents metastatic lymphadenopathy. No other definite extra nodal metastatic disease noted elsewhere in the chest, abdomen or pelvis. 2. Large mass in the central anatomic pelvis which is of uncertain origin, potentially a large exophytic fibroid or a large solid mass arising from the right ovary. Further evaluation with pelvic ultrasound is strongly recommended.   03/21/2020 Imaging   IMPRESSION: Apparent arthropathy at L5. No bony metastatic disease is demonstrable on this study. Scattered foci of abnormal uptake in a pelvic mass is of uncertain etiology given absence of calcification in this mass by CT. This mass compresses the urinary bladder. It is possible that some of the increased uptake in this area actually represents physiologic uptake within the bladder.   03/22/2020 -  Neo-Adjuvant Chemotherapy   Adriamycin and Cytoxan q2weeks for 4 cycles  starting 03/22/20 followed by weekly Taxol and Carboplatin for 12 weeks   03/22/2020 -  Chemotherapy   The patient had dexamethasone (DECADRON) 4 MG tablet, 1 of 1 cycle, Start date: 03/22/2020, End date: -- DOXOrubicin (ADRIAMYCIN) chemo injection  116 mg, 60 mg/m2 = 116 mg, Intravenous,  Once, 1 of 4 cycles palonosetron (ALOXI) injection 0.25 mg, 0.25 mg, Intravenous,  Once, 1 of 8 cycles pegfilgrastim-jmdb (FULPHILA) injection 6 mg, 6 mg, Subcutaneous,  Once, 1 of 4 cycles CARBOplatin (PARAPLATIN) in sodium chloride 0.9 % 100 mL chemo infusion, , Intravenous,  Once, 0 of 4 cycles cyclophosphamide (CYTOXAN) 1,160 mg in sodium chloride 0.9 % 250 mL chemo infusion, 600 mg/m2 = 1,160 mg, Intravenous,  Once, 1 of 4 cycles PACLitaxel (TAXOL) 156 mg in sodium chloride 0.9 % 250 mL chemo infusion (</= 31m/m2), 80 mg/m2, Intravenous,  Once, 0 of 4 cycles fosaprepitant (EMEND) 150 mg in sodium chloride 0.9 % 145 mL IVPB, 150 mg, Intravenous,  Once, 1 of 8 cycles  for chemotherapy treatment.      CURRENT THERAPY: Pending neoadjuvant chemo with ddAC-T  INTERVAL HISTORY: Ms. PClavelreturns for follow-up and treatment as scheduled.  She was initially seen on 03/10/2020.  She underwent bilateral breast MRI on 03/16/2020 which showed the 8.1 x 7.8 x 6.6 biopsy-proven invasive ductal carcinoma in the central right breast involving 3 quadrants and a satellite mass measuring 3.0 x 1.7 x 1.1 cm in the lower inner quadrant of the left breast compatible with additional malignancy as well as level 1 and level 2 metastatic axillary lymph nodes.  The right breast was benign.  She underwent staging CT CAP on 03/17/2020 which showed diffuse skin thickening in the left breast with axillary and subpectoral lymphadenopathy as well as mildly enlarged left supraglottic lymph node concerning for nodal metastasis.  There was incidental finding of a large indeterminate mass in the central pelvis measuring 8.9 x 6.4 x 5.3 cm.  A pelvic ultrasound is scheduled on 9/17 for further evaluation.  She underwent bone scan, port placement, and baseline echo which was normal on 9/14.   Today she presents with her mother.  She is feeling well but anxious.  Appetite and energy are normal.  She  remains active, working.  The left breast mass is less tender, she avoids touching it, still red and swollen. Denies recent fever, chills, cough, chest pain, dyspnea, nausea, vomiting, constipation, diarrhea.   MEDICAL HISTORY:  Past Medical History:  Diagnosis Date   Anxiety    Depression     SURGICAL HISTORY: No past surgical history on file.  I have reviewed the social history and family history with the patient and they are unchanged from previous note.  ALLERGIES:  has No Known Allergies.  MEDICATIONS:  Current Outpatient Medications  Medication Sig Dispense Refill   ondansetron (ZOFRAN) 8 MG tablet Take 1 tablet (8 mg total) by mouth 2 (two) times daily as needed. Start on the third day after chemotherapy. 30 tablet 2   prochlorperazine (COMPAZINE) 10 MG tablet Take 1 tablet (10 mg total) by mouth every 6 (six) hours as needed (Nausea or vomiting). 30 tablet 2   sertraline (ZOLOFT) 50 MG tablet Take by mouth.     ALPRAZolam (XANAX) 0.5 MG tablet Take 0.5 mg by mouth 3 (three) times daily.     ALPRAZolam (XANAX) 1 MG tablet Take 1 mg by mouth at bedtime as needed for anxiety.     citalopram (CELEXA) 20 MG tablet Take 20  mg by mouth daily.     dexamethasone (DECADRON) 4 MG tablet Take 2 tablets by mouth daily starting the day after Carboplatin and Cytoxan x 3 days. Take with food. 30 tablet 1   lidocaine-prilocaine (EMLA) cream Apply to affected area once 30 g 3   norethindrone-ethinyl estradiol-iron (JUNEL FE 1.5/30) 1.5-30 MG-MCG tablet Take 1 tablet by mouth daily.     zolpidem (AMBIEN) 5 MG tablet Take 1-2 tablets (5-10 mg total) by mouth at bedtime as needed for sleep. 30 tablet 0   No current facility-administered medications for this visit.   Facility-Administered Medications Ordered in Other Visits  Medication Dose Route Frequency Provider Last Rate Last Admin   cyclophosphamide (CYTOXAN) 1,160 mg in sodium chloride 0.9 % 250 mL chemo infusion  600 mg/m2  (Treatment Plan Recorded) Intravenous Once Truitt Merle, MD       DOXOrubicin (ADRIAMYCIN) chemo injection 116 mg  60 mg/m2 (Treatment Plan Recorded) Intravenous Once Truitt Merle, MD       heparin lock flush 100 unit/mL  500 Units Intracatheter Once PRN Truitt Merle, MD       sodium chloride flush (NS) 0.9 % injection 10 mL  10 mL Intracatheter PRN Truitt Merle, MD        PHYSICAL EXAMINATION: ECOG PERFORMANCE STATUS: 0 - Asymptomatic  Vitals:   03/22/20 1353  BP: 119/79  Pulse: 80  Resp: 18  Temp: 98.2 F (36.8 C)  SpO2: 100%   Filed Weights   03/22/20 1353  Weight: 180 lb 14.4 oz (82.1 kg)    GENERAL:alert, no distress and comfortable SKIN: No rash to exposed skin EYES: nsclera clear NECK: No cervical or supraclavicular lymphadenopathy LUNGS:  normal breathing effort HEART:  no lower extremity edema NEURO: alert & oriented x 3 with fluent speech, no focal motor/sensory deficits PAC without erythema Breast: Palpable tender left axillary adenopathy with approximately 6 cm tender mass in the upper inner and central left breast involving the areola.  There is left nipple retraction.  The left breast with mild erythema and edema.  Right breast and axilla are benign  LABORATORY DATA:  I have reviewed the data as listed CBC Latest Ref Rng & Units 03/22/2020  WBC 4.0 - 10.5 K/uL 11.3(H)  Hemoglobin 12.0 - 15.0 g/dL 11.7(L)  Hematocrit 36 - 46 % 36.2  Platelets 150 - 400 K/uL 360     CMP Latest Ref Rng & Units 03/22/2020  Glucose 70 - 99 mg/dL 85  BUN 6 - 20 mg/dL 13  Creatinine 0.44 - 1.00 mg/dL 0.80  Sodium 135 - 145 mmol/L 138  Potassium 3.5 - 5.1 mmol/L 3.6  Chloride 98 - 111 mmol/L 106  CO2 22 - 32 mmol/L 26  Calcium 8.9 - 10.3 mg/dL 9.6  Total Protein 6.5 - 8.1 g/dL 7.5  Total Bilirubin 0.3 - 1.2 mg/dL 0.4  Alkaline Phos 38 - 126 U/L 125  AST 15 - 41 U/L 12(L)  ALT 0 - 44 U/L 10      RADIOGRAPHIC STUDIES: I have personally reviewed the radiological images as listed  and agreed with the findings in the report. NM Bone Scan Whole Body  Result Date: 03/22/2020 CLINICAL DATA:  Breast carcinoma EXAM: NUCLEAR MEDICINE WHOLE BODY BONE SCAN TECHNIQUE: Whole body anterior and posterior images were obtained approximately 3 hours after intravenous injection of radiopharmaceutical. RADIOPHARMACEUTICALS:  21.4 mCi Technetium-41mMDP IV COMPARISON:  CT chest, abdomen, and pelvis March 17, 2020 FINDINGS: Increased radiotracer uptake is noted in the L5  region. Recent CT shows arthropathic change in this area. Other bony structures show normal radiotracer uptake. There are patchy areas of increased opacity in the pelvis at the site of a mass seen by CT in the pelvis. Note that this mass on CT does not show appreciable calcification. Flank kidneys noted bilaterally. 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 No bony metastatic disease is demonstrable on this study. Scattered foci of abnormal uptake in a pelvic mass is of uncertain etiology given absence of calcification in this mass by CT. This mass compresses the urinary bladder. It is possible that some of the increased uptake in this area actually represents physiologic uptake within the bladder. Kidneys noted in flank positions bilaterally. Electronically Signed   By: Lowella Grip III M.D.   On: 03/22/2020 09:51   DG Fluoro Guide CV Line Right  Result Date: 03/22/2020 CLINICAL DATA:  Port-A-Cath placement in 39 year old female performed in the operating room. INDICATION: Port-A-Cath placement in 39 year old female performed in the operating room. EXAM: CENTRAL VENOUS CATHETER WITH FLUOROSCOPY COMPARISON:  None. FLUOROSCOPY TIME:  21.4 seconds FINDINGS: Single fluoroscopic spot image from placement of right chest wall Port-A-Cath with catheter appearing to enter the internal jugular vein. The tip terminates near the cavoatrial junction. No evidence of pneumothorax. A [employer]-Tec projects over the right neck. IMPRESSION:  Intraoperative single fluoroscopic image demonstrates a right internal jugular Port-A-Cath with the catheter tip near the cavoatrial junction. Electronically Signed   By: Ruthann Cancer MD   On: 03/22/2020 08:47     ASSESSMENT & PLAN: Nanetta Wiegman is a 39 y.o. premenopausal female   1.  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 Her ER/PR/HER2 markers were all negative.  -MRI on 03/16/2020 showed 8.1 x 7.8 x 6.6 cm biopsy-proven invasive ductal carcinoma in the central left breast involving 3 quadrants with a satellite mass inferiorly in the lower inner quadrant measuring 3.0 x 1.7 x 1.1 cm compatible with additional malignancy as well as metastatic level 1 and level 2 left axillary nodes.  The right breast is benign. -Staging work-up showed extensive left axillary and subpectoral lymphadenopathy and mildly enlarged left supraclavicular lymph node measuring 1 cm.  There was an incidental finding of an indeterminate pelvic mass measuring 8.9 x 6.4 x 5.3 cm.  She is scheduled for pelvic ultrasound on 03/24/2020.  There is no evidence of pulmonary or osseous metastatic disease.  The bone scan was negative.  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  No additional biopsies are being recommended at this time. -Baseline echo was normal, EF 60-65% -Plan is to proceed with neoadjuvant chemotherapy ddAC q2 x4 followed by carboplatin d1 and taxol on days q,8,15,21 total 12 weeks, followed by definitive surgery.  We discussed the importance of adjuvant radiation which will include the subpectoral  and supraclavicular regions. -Ms. Marcin appears anxious but otherwise well.  I reviewed the above work-up in detail. Port placed without difficulty.  I answered her follow up questions from chemo education class.  We again reviewed the expected side effects of chemo and symptom management.  I called in Decadron and Compazine to her pharmacy. -  Breast exam shows at least a 6 cm palpable mass in the left breast involving the upper inner, lower inner, and central breast with nipple retraction as well as palpable left axillary lymphadenopathy. -CBC and CMP were reviewed.  She will proceed with cycle 1 AC today as planned, G-CSF on 9/17. -She will return for lab and toxicity check next week with potential supportive care if needed.  2. Genetic Testing -Given her young age, Her MGM's breast cancer and father prostate cancer, she is eligible for genetic testing. She is agreeable, I will send referral.  -If she is found to have BRCA mutation, prophylactic b/l mastectomy and BSO would be recommended.  -I also discussed the effect of PARP inhibitor after surgery if she has BRCA1/2 mutation   3. Anxiety/Depression, Social Support  -She is currently on Xanax 52m TID and Celexa 553m This is managed by her PCP. -She has been more anxious with her cancer diagnosis and would like dose increase. -She has been referred to social work, and CuTeacher, English as a foreign languageo see her today -She owns her DoDevelopment worker, international aidnd notes she does not plan to work during this treatment time.  She was given our financial advocate's contact information   Plan: -Lab, echo, MRI, CT, bone scan reviewed  -Proceed with cycle 1 AC today as planned,  -Return for GCSF and pelvic ultrasound on 9/17 -Will meet with SW today, given financial advocate contact information  -Rx: decadron, compazine; discussed symptom management and meds  -Return for lab and toxicity check next week for potential supportive care if needed  All questions were  answered. The patient knows to call the clinic with any problems, questions or concerns. No barriers to learning were detected.  Total encounter time was 35 minutes.     LaAlla FeelingNP 03/22/20

## 2020-03-21 NOTE — H&P (Signed)
63 yof with several months of left breast pain and enlargement. this breast is larger and has to wear larger bra if she can tolerate it. it has been warm and heavy. tender to touch. it has become red in past couple weeks also. no dc. she also noted mass during last few weeks as well. she has fh in an aunt. she had mm with c density breasts. there is 2x1x2.6 cm mass at 12 oclock and skin thickening. US shows two adjacent irregular masses measuring 2.1x0.9x1.1 cm and other is 1.1x1.4x0.6 cm that total span is 3.6 cm. there are four enlarged subpectoral nodes and at least five enlarged ax nodes. 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  Diagnostic Studies History (Chanel Teressa Senter, [place]; 03/15/2020 10:15 AM) Colonoscopy  never Mammogram  within last year Pap Smear  1-5 years ago  Allergies (Chanel Teressa Senter, CMA; 03/15/2020 10:16 AM) No Known Drug Allergies  [03/15/2020]: Allergies Reconciled   Medication History (Chanel Teressa Senter, CMA; 03/15/2020 10:16 AM) ALPRAZolam (1MG  Tablet, Oral) Active. Citalopram Hydrobromide (20MG  Tablet, Oral) Active. Medications Reconciled  Social History Antonietta Jewel, CMA; 03/15/2020 10:15 AM) Alcohol use  Recently quit alcohol use. Caffeine use  Carbonated beverages, Coffee, Tea. Illicit drug use  Recently quit drug use. Tobacco use  Former smoker.  Family History Antonietta Jewel, [place]; 03/15/2020 10:15 AM) Arthritis  Mother. Breast Cancer  Family Members In General. Cerebrovascular Accident  Father. Prostate Cancer  Father. Respiratory Condition  Mother.  Pregnancy / Birth History Antonietta Jewel, [place]; 03/15/2020 10:15 AM) Age at menarche  53 years. Gravida  0 Para  0 Regular periods   Other Problems (Chanel Teressa Senter, CMA; 03/15/2020 10:15 AM) Anxiety Disorder  Arthritis   Breast Cancer  Depression  Lump In Breast     Review of Systems (Chanel Nolan CMA; 03/15/2020 10:15 AM) General Not Present- Appetite Loss, Chills, Fatigue, Fever, Night Sweats, Weight Gain and Weight Loss. Skin Not Present- Change in Wart/Mole, Dryness, Hives, Jaundice, New Lesions, Non-Healing Wounds, Rash and Ulcer. HEENT Not Present- Earache, Hearing Loss, Hoarseness, Nose Bleed, Oral Ulcers, Ringing in the Ears, Seasonal Allergies, Sinus Pain, Sore Throat, Visual Disturbances, Wears glasses/contact lenses and Yellow Eyes. Breast Present- Breast Mass, Breast Pain and Skin Changes. Not Present- Nipple Discharge. Gastrointestinal Not Present- Abdominal Pain, Bloating, Bloody Stool, Change in Bowel Habits, Chronic diarrhea, Constipation, Difficulty Swallowing, Excessive gas, Gets full quickly at meals, Hemorrhoids, Indigestion, Nausea, Rectal Pain and Vomiting. Neurological Not Present- Decreased Memory, Fainting, Headaches, Numbness, Seizures, Tingling, Tremor, Trouble walking and Weakness. Psychiatric Present- Anxiety, Change in Sleep Pattern, Fearful and Frequent crying. Not Present- Bipolar and Depression. Endocrine Not Present- Cold Intolerance, Excessive Hunger, Hair Changes, Heat Intolerance, Hot flashes and New Diabetes. Hematology Present- Gland problems. Not Present- Blood Thinners, Easy Bruising, Excessive bleeding, HIV and Persistent Infections.  Vitals (Chanel Nolan CMA; 03/15/2020 10:16 AM) 03/15/2020 10:16 AM Weight: 181.25 lb Height: 64in Body Surface Area: 1.88 m Body Mass Index: 31.11 kg/m  Temp.: 97.41F  Pulse: 92 (Regular)        Physical Exam [name] MD; 03/15/2020 2:02 PM) General Mental Status-Alert. Orientation-Oriented X3.  Breast Nipples-No Discharge. Note: right breast without mass left breast with nearly whole breast erythema and subtle peau d'orange changes, mass at upper portion of breast hard mobile over 3 cm  in  size   Lymphatic Head & Neck  General Head & Neck Lymphatics: Bilateral - Description - Normal. Note: no Stanton adenopathy left axillary swelling and adenopathy     Assessment & Plan [name] MD; 03/15/2020 3:11 PM) BREAST CANCER METASTASIZED TO AXILLARY LYMPH NODE, LEFT (C50.912) Story: Port placement, staging, MRI, primary chemotherapy I told her today she has IBC clinically and I would treat her like that. she has rapid enlargement, erythema and peau d orange and with imaging clinical picture I think that is what this is. I discussed rationale for systemic therapy first and discussed a right IJ port to be placed next week. will leave accessed for chemo following day will have sozo measurements done prior to chemotherapy I discussed will need mrm at time of surgery following chemotherapy. chemo first is not to downstage her for less surgery given IBC diagnosis. Impression: I spent 40 minutes with patient and reviewing data

## 2020-03-21 NOTE — Anesthesia Postprocedure Evaluation (Signed)
Anesthesia Post Note  Patient: Barri Neidlinger  Procedure(s) Performed: INSERTION PORT-A-CATH WITH ULTRASOUND GUIDANCE (Right Chest)     Patient location during evaluation: PACU Anesthesia Type: General Level of consciousness: awake and alert Pain management: pain level controlled Vital Signs Assessment: post-procedure vital signs reviewed and stable Respiratory status: spontaneous breathing, nonlabored ventilation, respiratory function stable and patient connected to nasal cannula oxygen Cardiovascular status: blood pressure returned to baseline and stable Postop Assessment: no apparent nausea or vomiting Anesthetic complications: no   No complications documented.  Last Vitals:  Vitals:   03/21/20 1546 03/21/20 1600  BP:  135/83  Pulse: 95 98  Resp: 11 11  Temp:    SpO2: 100% 99%    Last Pain:  Vitals:   03/21/20 1600  TempSrc:   PainSc: Asleep                 Abbigaile Rockman

## 2020-03-21 NOTE — Progress Notes (Signed)

## 2020-03-22 ENCOUNTER — Other Ambulatory Visit: Payer: 59

## 2020-03-22 ENCOUNTER — Encounter: Payer: Self-pay | Admitting: *Deleted

## 2020-03-22 ENCOUNTER — Inpatient Hospital Stay (HOSPITAL_BASED_OUTPATIENT_CLINIC_OR_DEPARTMENT_OTHER): Payer: 59 | Admitting: Nurse Practitioner

## 2020-03-22 ENCOUNTER — Inpatient Hospital Stay: Payer: 59

## 2020-03-22 ENCOUNTER — Ambulatory Visit: Payer: 59 | Admitting: Hematology

## 2020-03-22 ENCOUNTER — Inpatient Hospital Stay: Payer: 59 | Admitting: General Practice

## 2020-03-22 ENCOUNTER — Encounter: Payer: Self-pay | Admitting: Nurse Practitioner

## 2020-03-22 ENCOUNTER — Ambulatory Visit: Payer: 59

## 2020-03-22 VITALS — BP 119/79 | HR 80 | Temp 98.2°F | Resp 18 | Ht 64.0 in | Wt 180.9 lb

## 2020-03-22 DIAGNOSIS — C50112 Malignant neoplasm of central portion of left female breast: Secondary | ICD-10-CM

## 2020-03-22 DIAGNOSIS — Z95828 Presence of other vascular implants and grafts: Secondary | ICD-10-CM

## 2020-03-22 DIAGNOSIS — Z171 Estrogen receptor negative status [ER-]: Secondary | ICD-10-CM | POA: Diagnosis not present

## 2020-03-22 DIAGNOSIS — Z5111 Encounter for antineoplastic chemotherapy: Secondary | ICD-10-CM | POA: Diagnosis not present

## 2020-03-22 LAB — CMP (CANCER CENTER ONLY)
ALT: 10 U/L (ref 0–44)
AST: 12 U/L — ABNORMAL LOW (ref 15–41)
Albumin: 3.7 g/dL (ref 3.5–5.0)
Alkaline Phosphatase: 125 U/L (ref 38–126)
Anion gap: 6 (ref 5–15)
BUN: 13 mg/dL (ref 6–20)
CO2: 26 mmol/L (ref 22–32)
Calcium: 9.6 mg/dL (ref 8.9–10.3)
Chloride: 106 mmol/L (ref 98–111)
Creatinine: 0.8 mg/dL (ref 0.44–1.00)
GFR, Est AFR Am: >60 mL/min (ref >60)
GFR, Estimated: >60 mL/min (ref >60)
Glucose, Bld: 85 mg/dL (ref 70–99)
Potassium: 3.6 mmol/L (ref 3.5–5.1)
Sodium: 138 mmol/L (ref 135–145)
Total Bilirubin: 0.4 mg/dL (ref 0.3–1.2)
Total Protein: 7.5 g/dL (ref 6.5–8.1)

## 2020-03-22 LAB — CBC WITH DIFFERENTIAL (CANCER CENTER ONLY)
Abs Immature Granulocytes: 0.04 10*3/uL (ref 0.00–0.07)
Basophils Absolute: 0 10*3/uL (ref 0.0–0.1)
Basophils Relative: 0 %
Eosinophils Absolute: 0 10*3/uL (ref 0.0–0.5)
Eosinophils Relative: 0 %
HCT: 36.2 % (ref 36.0–46.0)
Hemoglobin: 11.7 g/dL — ABNORMAL LOW (ref 12.0–15.0)
Immature Granulocytes: 0 %
Lymphocytes Relative: 15 %
Lymphs Abs: 1.6 10*3/uL (ref 0.7–4.0)
MCH: 27.1 pg (ref 26.0–34.0)
MCHC: 32.3 g/dL (ref 30.0–36.0)
MCV: 83.8 fL (ref 80.0–100.0)
Monocytes Absolute: 1.2 10*3/uL — ABNORMAL HIGH (ref 0.1–1.0)
Monocytes Relative: 10 %
Neutro Abs: 8.4 10*3/uL — ABNORMAL HIGH (ref 1.7–7.7)
Neutrophils Relative %: 75 %
Platelet Count: 360 10*3/uL (ref 150–400)
RBC: 4.32 MIL/uL (ref 3.87–5.11)
RDW: 13.7 % (ref 11.5–15.5)
WBC Count: 11.3 10*3/uL — ABNORMAL HIGH (ref 4.0–10.5)
nRBC: 0 % (ref 0.0–0.2)

## 2020-03-22 MED ORDER — SODIUM CHLORIDE 0.9 % IV SOLN
10.0000 mg | Freq: Once | INTRAVENOUS | Status: AC
  Administered 2020-03-22: 10 mg via INTRAVENOUS
  Filled 2020-03-22: qty 10

## 2020-03-22 MED ORDER — PROCHLORPERAZINE MALEATE 10 MG PO TABS: 10.0000 mg | ORAL_TABLET | Freq: Four times a day (QID) | ORAL | 2 refills | Status: DC | PRN

## 2020-03-22 MED ORDER — HEPARIN SOD (PORK) LOCK FLUSH 100 UNIT/ML IV SOLN
500.0000 [IU] | Freq: Once | INTRAVENOUS | Status: AC | PRN
  Administered 2020-03-22: 500 [IU]
  Filled 2020-03-22: qty 5

## 2020-03-22 MED ORDER — SODIUM CHLORIDE 0.9% FLUSH
10.0000 mL | Freq: Once | INTRAVENOUS | Status: AC
  Administered 2020-03-22: 10 mL via INTRAVENOUS
  Filled 2020-03-22: qty 10

## 2020-03-22 MED ORDER — SODIUM CHLORIDE 0.9 % IV SOLN
Freq: Once | INTRAVENOUS | Status: AC
  Filled 2020-03-22: qty 250

## 2020-03-22 MED ORDER — DEXAMETHASONE 4 MG PO TABS: ORAL_TABLET | ORAL | 1 refills | Status: DC

## 2020-03-22 MED ORDER — SODIUM CHLORIDE 0.9 % IV SOLN
150.0000 mg | Freq: Once | INTRAVENOUS | Status: AC
  Administered 2020-03-22: 150 mg via INTRAVENOUS
  Filled 2020-03-22: qty 150

## 2020-03-22 MED ORDER — SODIUM CHLORIDE 0.9 % IV SOLN
600.0000 mg/m2 | Freq: Once | INTRAVENOUS | Status: AC
  Administered 2020-03-22: 1160 mg via INTRAVENOUS
  Filled 2020-03-22: qty 58

## 2020-03-22 MED ORDER — PALONOSETRON HCL INJECTION 0.25 MG/5ML
0.2500 mg | Freq: Once | INTRAVENOUS | Status: AC
  Administered 2020-03-22: 0.25 mg via INTRAVENOUS

## 2020-03-22 MED ORDER — DOXORUBICIN HCL CHEMO IV INJECTION 2 MG/ML
60.0000 mg/m2 | Freq: Once | INTRAVENOUS | Status: AC
  Administered 2020-03-22: 116 mg via INTRAVENOUS
  Filled 2020-03-22: qty 58

## 2020-03-22 MED ORDER — SODIUM CHLORIDE 0.9% FLUSH
10.0000 mL | INTRAVENOUS | Status: DC | PRN
  Administered 2020-03-22: 10 mL
  Filled 2020-03-22: qty 10

## 2020-03-22 MED ORDER — PALONOSETRON HCL INJECTION 0.25 MG/5ML
INTRAVENOUS | Status: AC
  Filled 2020-03-22: qty 5

## 2020-03-22 NOTE — Patient Instructions (Signed)
[facility] Discharge Instructions for Patients Receiving Chemotherapy  Today you received the following chemotherapy agents Doxorubicin (ADRIAMYCIN) & Cyclophosphamide (CYTOXAN).  To help prevent nausea and vomiting after your treatment, we encourage you to take your nausea medication as prescribed.   If you develop nausea and vomiting that is not controlled by your nausea medication, call the clinic.   BELOW ARE SYMPTOMS THAT SHOULD BE REPORTED IMMEDIATELY:  *FEVER GREATER THAN 100.5 F  *CHILLS WITH OR WITHOUT FEVER  NAUSEA AND VOMITING THAT IS NOT CONTROLLED WITH YOUR NAUSEA MEDICATION  *UNUSUAL SHORTNESS OF BREATH  *UNUSUAL BRUISING OR BLEEDING  TENDERNESS IN MOUTH AND THROAT WITH OR WITHOUT PRESENCE OF ULCERS  *URINARY PROBLEMS  *BOWEL PROBLEMS  UNUSUAL RASH Items with * indicate a potential emergency and should be followed up as soon as possible.  Feel free to call the clinic should you have any questions or concerns. The clinic phone number is [phone].  Please show the CHEMO ALERT CARD at check-in to the Emergency Department and triage nurse.  Doxorubicin injection What is this medicine? DOXORUBICIN (dox oh ROO bi sin) is a chemotherapy drug. It is used to treat many kinds of cancer like leukemia, lymphoma, neuroblastoma, sarcoma, and Wilms' tumor. It is also used to treat bladder cancer, breast cancer, lung cancer, ovarian cancer, stomach cancer, and thyroid cancer. This medicine may be used for other purposes; ask your health care provider or pharmacist if you have questions. COMMON BRAND NAME(S): Adriamycin, Adriamycin PFS, Adriamycin RDF, Rubex What should I tell my health care provider before I take this medicine? They need to know if you have any of these conditions:  heart disease  history of low blood counts caused by a medicine  liver disease  recent or ongoing radiation therapy  an unusual or allergic reaction to doxorubicin,  other chemotherapy agents, other medicines, foods, dyes, or preservatives  pregnant or trying to get pregnant  breast-feeding How should I use this medicine? This drug is given as an infusion into a vein. It is administered in a hospital or clinic by a specially trained health care professional. If you have pain, swelling, burning or any unusual feeling around the site of your injection, tell your health care professional right away. Talk to your pediatrician regarding the use of this medicine in children. Special care may be needed. Overdosage: If you think you have taken too much of this medicine contact a poison control center or emergency room at once. NOTE: This medicine is only for you. Do not share this medicine with others. What if I miss a dose? It is important not to miss your dose. Call your doctor or health care professional if you are unable to keep an appointment. What may interact with this medicine? This medicine may interact with the following medications:  6-mercaptopurine  paclitaxel  phenytoin  St. John's Wort  trastuzumab  verapamil This list may not describe all possible interactions. Give your health care provider a list of all the medicines, herbs, non-prescription drugs, or dietary supplements you use. Also tell them if you smoke, drink alcohol, or use illegal drugs. Some items may interact with your medicine. What should I watch for while using this medicine? This drug may make you feel generally unwell. This is not uncommon, as chemotherapy can affect healthy cells as well as cancer cells. Report any side effects. Continue your course of treatment even though you feel ill unless your doctor tells you to stop. There is a   maximum amount of this medicine you should receive throughout your life. The amount depends on the medical condition being treated and your overall health. Your doctor will watch how much of this medicine you receive in your lifetime. Tell your  doctor if you have taken this medicine before. You may need blood work done while you are taking this medicine. Your urine may turn red for a few days after your dose. This is not blood. If your urine is dark or brown, call your doctor. In some cases, you may be given additional medicines to help with side effects. Follow all directions for their use. Call your doctor or health care professional for advice if you get a fever, chills or sore throat, or other symptoms of a cold or flu. Do not treat yourself. This drug decreases your body's ability to fight infections. Try to avoid being around people who are sick. This medicine may increase your risk to bruise or bleed. Call your doctor or health care professional if you notice any unusual bleeding. Talk to your doctor about your risk of cancer. You may be more at risk for certain types of cancers if you take this medicine. Do not become pregnant while taking this medicine or for 6 months after stopping it. Women should inform their doctor if they wish to become pregnant or think they might be pregnant. Men should not father a child while taking this medicine and for 6 months after stopping it. There is a potential for serious side effects to an unborn child. Talk to your health care professional or pharmacist for more information. Do not breast-feed an infant while taking this medicine. This medicine has caused ovarian failure in some women and reduced sperm counts in some men This medicine may interfere with the ability to have a child. Talk with your doctor or health care professional if you are concerned about your fertility. This medicine may cause a decrease in Co-Enzyme Q-10. You should make sure that you get enough Co-Enzyme Q-10 while you are taking this medicine. Discuss the foods you eat and the vitamins you take with your health care professional. What side effects may I notice from receiving this medicine? Side effects that you should report to  your doctor or health care professional as soon as possible:  allergic reactions like skin rash, itching or hives, swelling of the face, lips, or tongue  breathing problems  chest pain  fast or irregular heartbeat  low blood counts - this medicine may decrease the number of white blood cells, red blood cells and platelets. You may be at increased risk for infections and bleeding.  pain, redness, or irritation at site where injected  signs of infection - fever or chills, cough, sore throat, pain or difficulty passing urine  signs of decreased platelets or bleeding - bruising, pinpoint red spots on the skin, black, tarry stools, blood in the urine  swelling of the ankles, feet, hands  tiredness  weakness Side effects that usually do not require medical attention (report to your doctor or health care professional if they continue or are bothersome):  diarrhea  hair loss  mouth sores  nail discoloration or damage  nausea  red colored urine  vomiting This list may not describe all possible side effects. Call your doctor for medical advice about side effects. You may report side effects to FDA at 1-800-FDA-1088. Where should I keep my medicine? This drug is given in a hospital or clinic and will not be stored   at home. NOTE: This sheet is a summary. It may not cover all possible information. If you have questions about this medicine, talk to your doctor, pharmacist, or health care provider.  2020 Elsevier/Gold Standard (2017-02-05 11:01:26)  Cyclophosphamide Injection What is this medicine? CYCLOPHOSPHAMIDE (sye kloe FOSS fa mide) is a chemotherapy drug. It slows the growth of cancer cells. This medicine is used to treat many types of cancer like lymphoma, myeloma, leukemia, breast cancer, and ovarian cancer, to name a few. This medicine may be used for other purposes; ask your health care provider or pharmacist if you have questions. COMMON BRAND NAME(S): Cytoxan,  Neosar What should I tell my health care provider before I take this medicine? They need to know if you have any of these conditions:  heart disease  history of irregular heartbeat  infection  kidney disease  liver disease  low blood counts, like white cells, platelets, or red blood cells  on hemodialysis  recent or ongoing radiation therapy  scarring or thickening of the lungs  trouble passing urine  an unusual or allergic reaction to cyclophosphamide, other medicines, foods, dyes, or preservatives  pregnant or trying to get pregnant  breast-feeding How should I use this medicine? This drug is usually given as an injection into a vein or muscle or by infusion into a vein. It is administered in a hospital or clinic by a specially trained health care professional. Talk to your pediatrician regarding the use of this medicine in children. Special care may be needed. Overdosage: If you think you have taken too much of this medicine contact a poison control center or emergency room at once. NOTE: This medicine is only for you. Do not share this medicine with others. What if I miss a dose? It is important not to miss your dose. Call your doctor or health care professional if you are unable to keep an appointment. What may interact with this medicine?  amphotericin B  azathioprine  certain antivirals for HIV or hepatitis  certain medicines for blood pressure, heart disease, irregular heart beat  certain medicines that treat or prevent blood clots like warfarin  certain other medicines for cancer  cyclosporine  etanercept  indomethacin  medicines that relax muscles for surgery  medicines to increase blood counts  metronidazole This list may not describe all possible interactions. Give your health care provider a list of all the medicines, herbs, non-prescription drugs, or dietary supplements you use. Also tell them if you smoke, drink alcohol, or use illegal  drugs. Some items may interact with your medicine. What should I watch for while using this medicine? Your condition will be monitored carefully while you are receiving this medicine. You may need blood work done while you are taking this medicine. Drink water or other fluids as directed. Urinate often, even at night. Some products may contain alcohol. Ask your health care professional if this medicine contains alcohol. Be sure to tell all health care professionals you are taking this medicine. Certain medicines, like metronidazole and disulfiram, can cause an unpleasant reaction when taken with alcohol. The reaction includes flushing, headache, nausea, vomiting, sweating, and increased thirst. The reaction can last from 30 minutes to several hours. Do not become pregnant while taking this medicine or for 1 year after stopping it. Women should inform their health care professional if they wish to become pregnant or think they might be pregnant. Men should not father a child while taking this medicine and for 4 months after stopping it.   There is potential for serious side effects to an unborn child. Talk to your health care professional for more information. Do not breast-feed an infant while taking this medicine or for 1 week after stopping it. This medicine has caused ovarian failure in some women. This medicine may make it more difficult to get pregnant. Talk to your health care professional if you are concerned about your fertility. This medicine has caused decreased sperm counts in some men. This may make it more difficult to father a child. Talk to your health care professional if you are concerned about your fertility. Call your health care professional for advice if you get a fever, chills, or sore throat, or other symptoms of a cold or flu. Do not treat yourself. This medicine decreases your body's ability to fight infections. Try to avoid being around people who are sick. Avoid taking medicines  that contain aspirin, acetaminophen, ibuprofen, naproxen, or ketoprofen unless instructed by your health care professional. These medicines may hide a fever. Talk to your health care professional about your risk of cancer. You may be more at risk for certain types of cancer if you take this medicine. If you are going to need surgery or other procedure, tell your health care professional that you are using this medicine. Be careful brushing or flossing your teeth or using a toothpick because you may get an infection or bleed more easily. If you have any dental work done, tell your dentist you are receiving this medicine. What side effects may I notice from receiving this medicine? Side effects that you should report to your doctor or health care professional as soon as possible:  allergic reactions like skin rash, itching or hives, swelling of the face, lips, or tongue  breathing problems  nausea, vomiting  signs and symptoms of bleeding such as bloody or black, tarry stools; red or dark brown urine; spitting up blood or brown material that looks like coffee grounds; red spots on the skin; unusual bruising or bleeding from the eyes, gums, or nose  signs and symptoms of heart failure like fast, irregular heartbeat, sudden weight gain; swelling of the ankles, feet, hands  signs and symptoms of infection like fever; chills; cough; sore throat; pain or trouble passing urine  signs and symptoms of kidney injury like trouble passing urine or change in the amount of urine  signs and symptoms of liver injury like dark yellow or brown urine; general ill feeling or flu-like symptoms; light-colored stools; loss of appetite; nausea; right upper belly pain; unusually weak or tired; yellowing of the eyes or skin Side effects that usually do not require medical attention (report to your doctor or health care professional if they continue or are bothersome):  confusion  decreased  hearing  diarrhea  facial flushing  hair loss  headache  loss of appetite  missed menstrual periods  signs and symptoms of low red blood cells or anemia such as unusually weak or tired; feeling faint or lightheaded; falls  skin discoloration This list may not describe all possible side effects. Call your doctor for medical advice about side effects. You may report side effects to FDA at 1-800-FDA-1088. Where should I keep my medicine? This drug is given in a hospital or clinic and will not be stored at home. NOTE: This sheet is a summary. It may not cover all possible information. If you have questions about this medicine, talk to your doctor, pharmacist, or health care provider.  2020 Elsevier/Gold Standard (2019-03-29 09:53:29)  

## 2020-03-22 NOTE — Progress Notes (Signed)
[place] CSW Progress Notes  Met briefly w patient in infusion, provided information and applications for [employer] copy in [place] and [employer].  Patient asked to review and contact SW team for help in completing these applications.  She was also given name of her Estate manager/land agent and advised to contact them re [employer].  Edwyna Shell, LCSW Clinical Social Worker Phone:  [phone] Cell:  [phone]

## 2020-03-23 ENCOUNTER — Telehealth: Payer: Self-pay | Admitting: *Deleted

## 2020-03-23 LAB — CANCER ANTIGEN 27.29: CA 27.29: 28.7 U/mL (ref 0.0–38.6)

## 2020-03-23 NOTE — Telephone Encounter (Signed)
I spoke with Ms Amara,  She is doing well.  She has no side effects.  I reviewed use of antiemetics, decadron, and claritin.  She verbalized understanding.

## 2020-03-23 NOTE — Telephone Encounter (Signed)
-----   Message from Wylene Men, RN sent at 03/22/2020  5:31 PM EDT ----- Regarding: [place] A/C Patient received 1st time adriamycin/cytoxan.  Tolerated well.  No s/s or c/o distress.

## 2020-03-23 NOTE — Telephone Encounter (Signed)
Called & left message for pt to return call to let us know how she is doing since her treatment yest.

## 2020-03-23 NOTE — Addendum Note (Signed)
Addended by: Tora Kindred on: 03/23/2020 08:04 AM   Modules accepted: Orders

## 2020-03-24 ENCOUNTER — Other Ambulatory Visit (HOSPITAL_COMMUNITY): Payer: 59

## 2020-03-24 ENCOUNTER — Other Ambulatory Visit: Payer: Self-pay

## 2020-03-24 ENCOUNTER — Ambulatory Visit (HOSPITAL_COMMUNITY)
Admission: RE | Admit: 2020-03-24 | Discharge: 2020-03-24 | Disposition: A | Discharge status: Home / Self Care | Payer: 59 | Source: Ambulatory Visit | Attending: Hematology | Admitting: Hematology

## 2020-03-24 ENCOUNTER — Other Ambulatory Visit: Payer: Self-pay | Admitting: Hematology

## 2020-03-24 ENCOUNTER — Inpatient Hospital Stay: Payer: 59

## 2020-03-24 ENCOUNTER — Telehealth: Payer: Self-pay | Admitting: Nurse Practitioner

## 2020-03-24 VITALS — BP 117/83 | HR 59 | Temp 98.2°F | Resp 18

## 2020-03-24 DIAGNOSIS — Z171 Estrogen receptor negative status [ER-]: Secondary | ICD-10-CM

## 2020-03-24 DIAGNOSIS — C50112 Malignant neoplasm of central portion of left female breast: Secondary | ICD-10-CM | POA: Insufficient documentation

## 2020-03-24 DIAGNOSIS — Z5111 Encounter for antineoplastic chemotherapy: Secondary | ICD-10-CM | POA: Diagnosis not present

## 2020-03-24 IMAGING — US US PELVIS COMPLETE
2 of 3 series · 13 of 25 positions shown · non-contrast
Comparison: CT abdomen pelvis [DATE]

CLINICAL DATA: Evaluation of a pelvic mass seen on comparison CT

EXAM:
TRANSABDOMINAL ULTRASOUND OF PELVIS
TECHNIQUE: Transabdominal ultrasound examination of the pelvis was performed
including evaluation of the uterus, ovaries, adnexal regions, and
pelvic cul-de-sac.

[Series 1: us pelvis complete · 12 of 30 slices shown (1 of 2)]
[im 1/30]
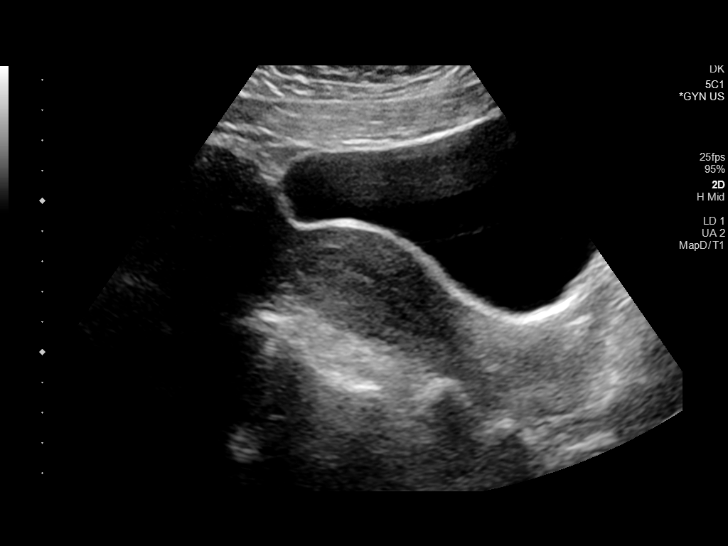
[im 3/30]
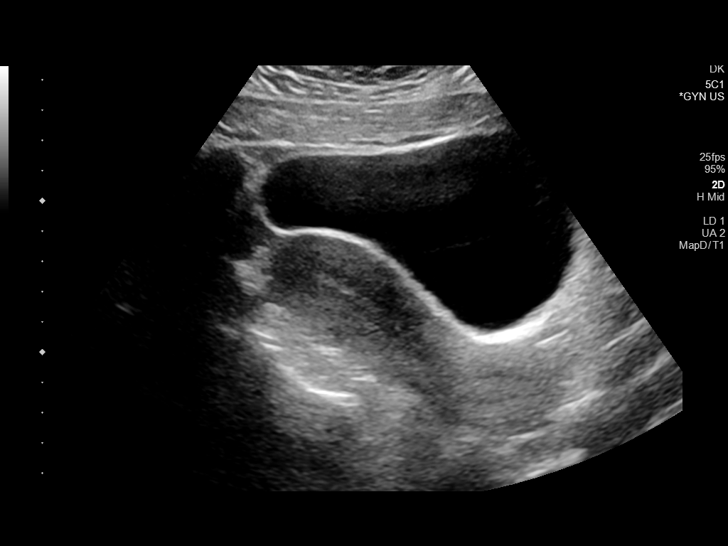
[im 6/30]
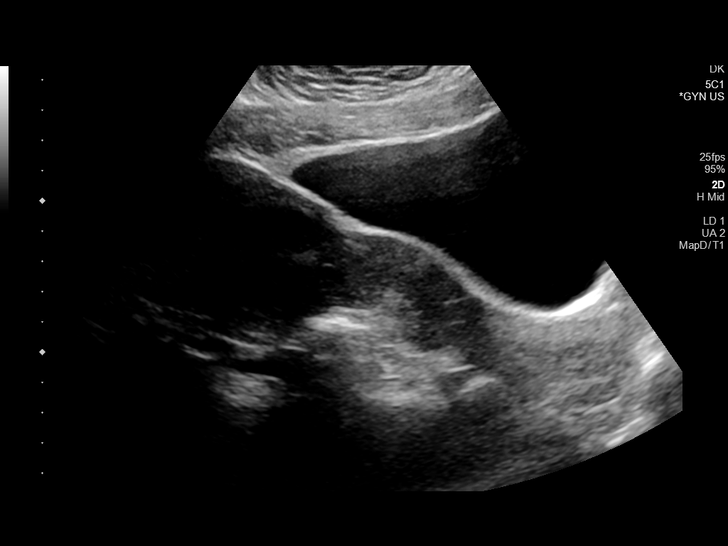
[im 8/30]
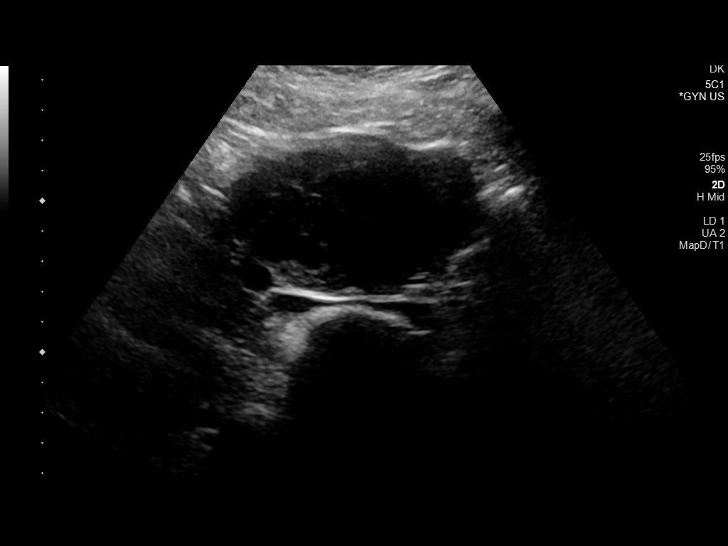
[im 11/30]
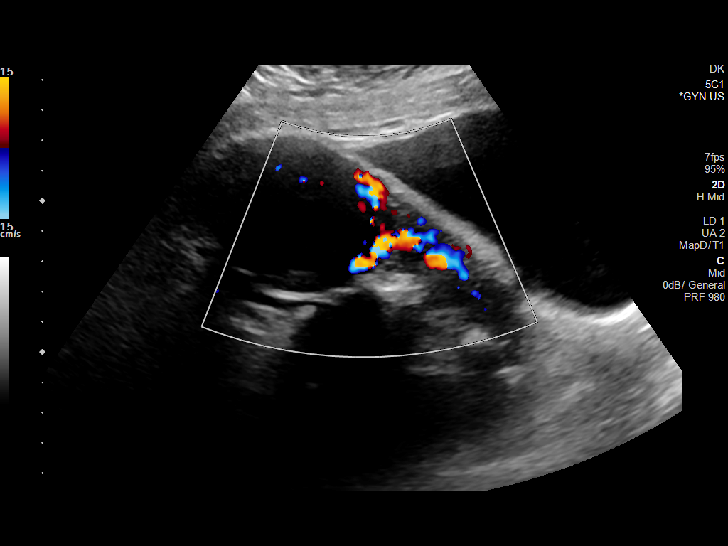
[im 14/30]
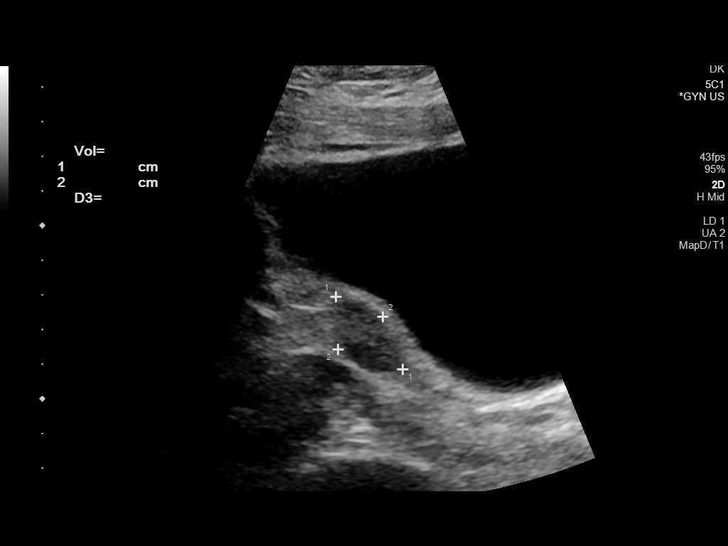
[im 16/30]
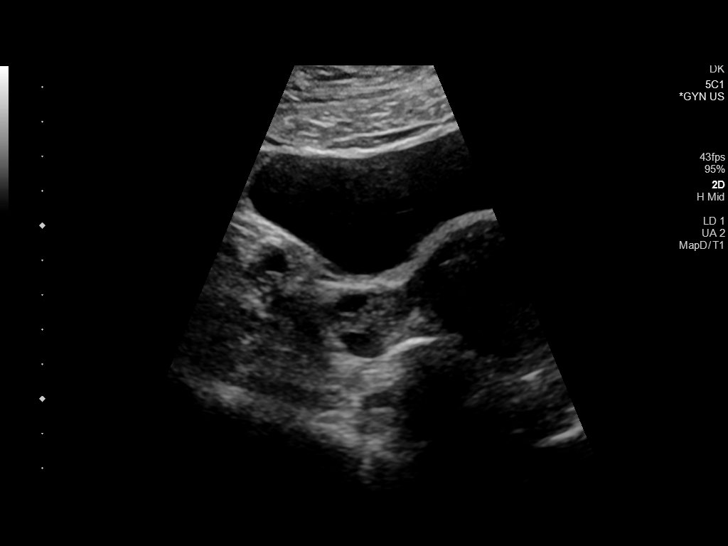
[im 19/30]
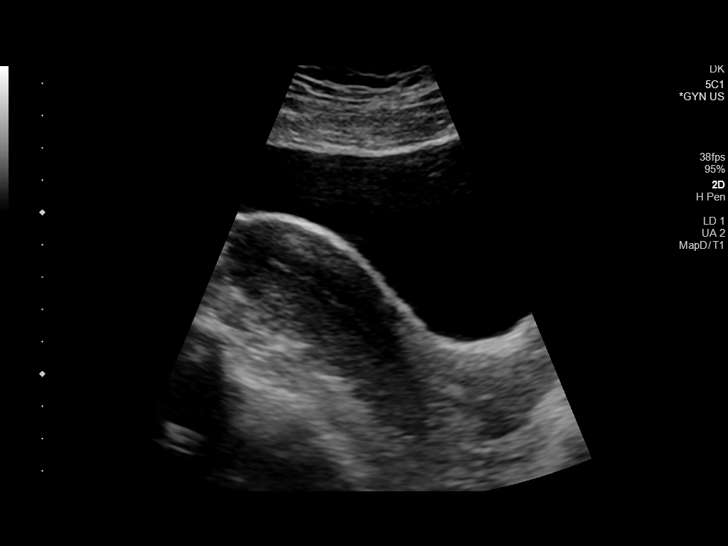
[im 22/30]
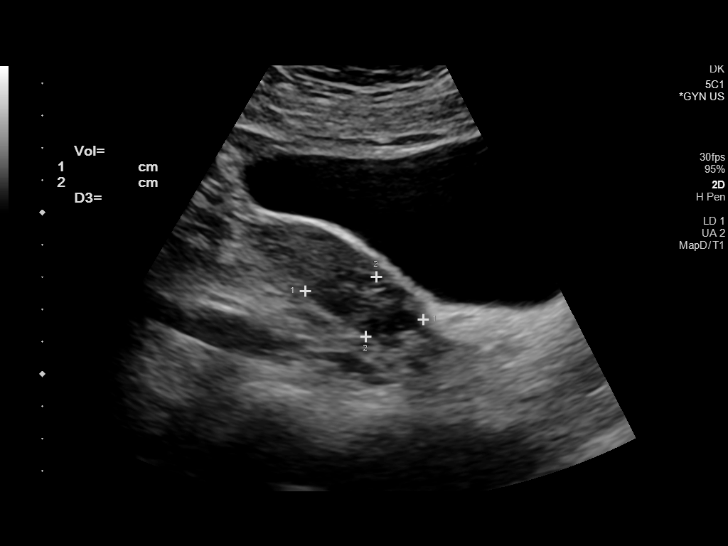
[im 24/30]
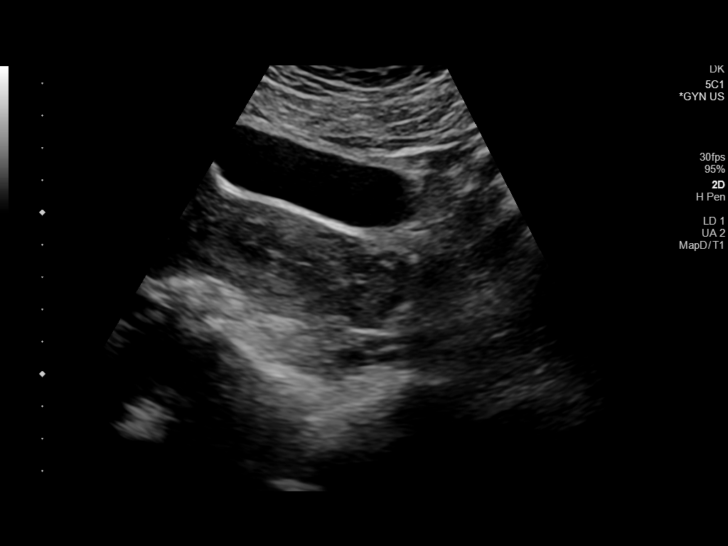
[im 27/30]
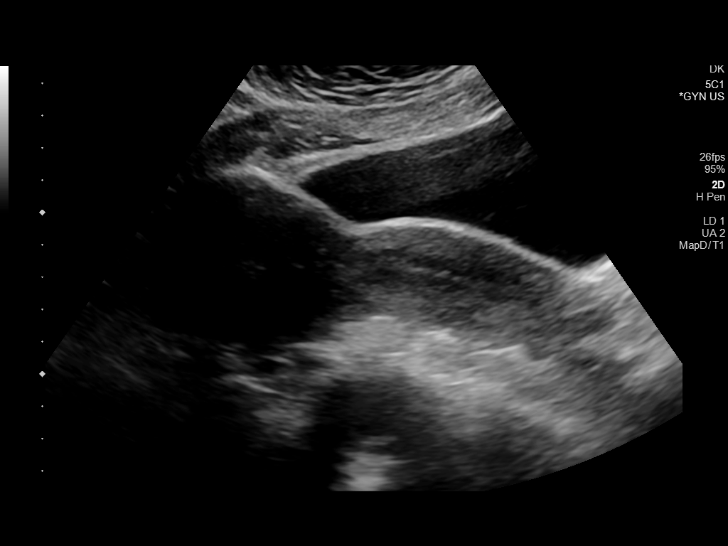
[im 30/30]
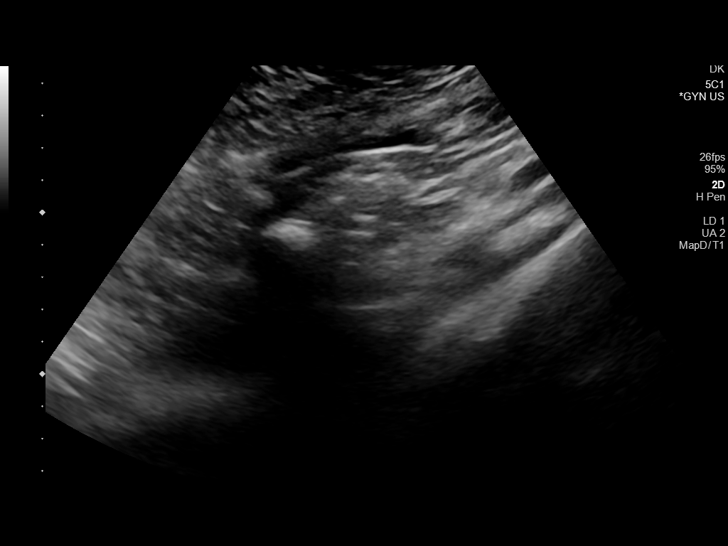

[Series 3: us pelvis complete · 1 of 1 slices shown (2 of 2)]
[im 1/1]
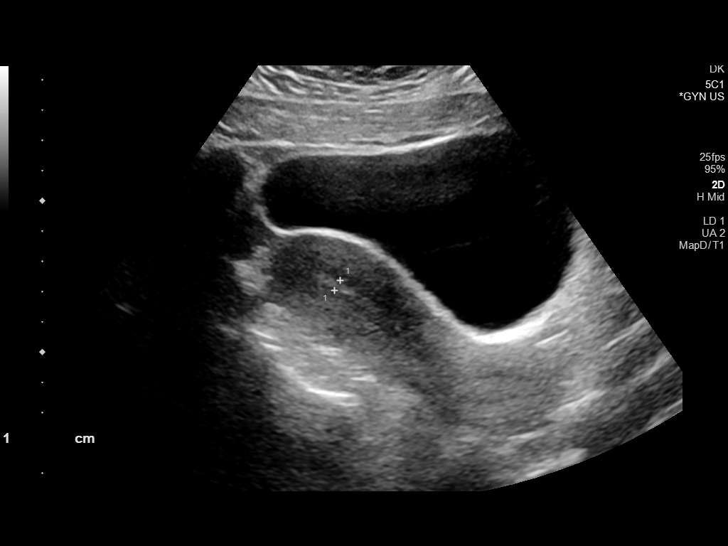

[13 of 25 positions shown; findings below may reference images not displayed]

FINDINGS: Uterus

Measurements: 11.1 x 3.4 x 4.6 cm = volume: 90.1 mL. There is a
large pedunculated lesion which appears directly contiguous with the
uterine fundus demonstrating a contiguous vascular pedicle most
suggestive of a large subserosal fibroid which measures up to 7.6 x
7.2 x 8.2 cm. An additional hypoechoic 1 x 0.8 x 0.8 cm probable
intramural fibroid in the right anterior aspect of the uterine body
is seen as well.

Endometrium

Thickness: 3.8 mm, non thickened.  No focal abnormality visualized.

Right ovary

Measurements: 2.9 x 1.6 x 2.7 cm = volume: 6.5 mL. Normal
appearance/no adnexal mass.

Left ovary

Measurements: 3.8 x 1.9 x 1.6 cm = volume: 5.8 mL. Normal
appearance/no adnexal mass.

Other findings:  No abnormal free fluid.
IMPRESSION: 1. Large pedunculated lesion directly contiguous with the uterine
most suggestive of a large subserosal fibroid which measures up to
8.2 cm.
2. Additional 1 cm probable intramural fibroid in the right anterior
uterine body.
3. No other acute or worrisome pelvic abnormality.

## 2020-03-24 MED ORDER — PEGFILGRASTIM-JMDB 6 MG/0.6ML ~~LOC~~ SOSY
PREFILLED_SYRINGE | SUBCUTANEOUS | Status: AC
  Filled 2020-03-24: qty 0.6

## 2020-03-24 MED ORDER — PEGFILGRASTIM-JMDB 6 MG/0.6ML ~~LOC~~ SOSY
6.0000 mg | PREFILLED_SYRINGE | Freq: Once | SUBCUTANEOUS | Status: AC
  Administered 2020-03-24: 6 mg via SUBCUTANEOUS

## 2020-03-24 NOTE — Patient Instructions (Signed)

## 2020-03-24 NOTE — Telephone Encounter (Signed)
Scheduled per 9/15 los. Unable to reach pt. Left voicemail with appt times and date added on 9/21

## 2020-03-27 ENCOUNTER — Encounter (HOSPITAL_BASED_OUTPATIENT_CLINIC_OR_DEPARTMENT_OTHER): Payer: Self-pay | Admitting: General Surgery

## 2020-03-27 ENCOUNTER — Telehealth: Payer: Self-pay

## 2020-03-27 ENCOUNTER — Telehealth: Payer: Self-pay | Admitting: *Deleted

## 2020-03-27 ENCOUNTER — Encounter: Payer: Self-pay | Admitting: *Deleted

## 2020-03-27 ENCOUNTER — Encounter: Payer: Self-pay | Admitting: Hematology

## 2020-03-27 NOTE — Telephone Encounter (Signed)
Pt called regarding schedule for 03/28/20. Informed of nadir check as well as IVF if needed. Pt relate doing well but has had a headache since treatment. Discussed type of anti-nausea medications that can cause HA and to inform Dr. Audie Box during nadir check. Received verbal understanding. Denies needs at this time.

## 2020-03-27 NOTE — Telephone Encounter (Signed)
Called patient to ask about scheduling a counseling appointment and left a message.

## 2020-03-28 ENCOUNTER — Other Ambulatory Visit: Payer: Self-pay

## 2020-03-28 ENCOUNTER — Inpatient Hospital Stay: Payer: 59

## 2020-03-28 ENCOUNTER — Inpatient Hospital Stay (HOSPITAL_BASED_OUTPATIENT_CLINIC_OR_DEPARTMENT_OTHER): Payer: 59 | Admitting: Nurse Practitioner

## 2020-03-28 ENCOUNTER — Encounter: Payer: Self-pay | Admitting: Nurse Practitioner

## 2020-03-28 VITALS — BP 127/75 | HR 86 | Temp 97.8°F | Resp 18 | Ht 64.0 in | Wt 186.1 lb

## 2020-03-28 VITALS — BP 117/81 | HR 82 | Temp 98.4°F | Resp 18

## 2020-03-28 DIAGNOSIS — Z171 Estrogen receptor negative status [ER-]: Secondary | ICD-10-CM

## 2020-03-28 DIAGNOSIS — G4452 New daily persistent headache (NDPH): Secondary | ICD-10-CM

## 2020-03-28 DIAGNOSIS — C50112 Malignant neoplasm of central portion of left female breast: Secondary | ICD-10-CM

## 2020-03-28 DIAGNOSIS — Z95828 Presence of other vascular implants and grafts: Secondary | ICD-10-CM

## 2020-03-28 DIAGNOSIS — Z23 Encounter for immunization: Secondary | ICD-10-CM

## 2020-03-28 DIAGNOSIS — Z5111 Encounter for antineoplastic chemotherapy: Secondary | ICD-10-CM | POA: Diagnosis not present

## 2020-03-28 LAB — CBC WITH DIFFERENTIAL (CANCER CENTER ONLY)
Abs Immature Granulocytes: 0 10*3/uL (ref 0.00–0.07)
Band Neutrophils: 3 %
Basophils Absolute: 0 10*3/uL (ref 0.0–0.1)
Basophils Relative: 0 %
Eosinophils Absolute: 0.1 10*3/uL (ref 0.0–0.5)
Eosinophils Relative: 1 %
HCT: 32.3 % — ABNORMAL LOW (ref 36.0–46.0)
Hemoglobin: 10.3 g/dL — ABNORMAL LOW (ref 12.0–15.0)
Lymphocytes Relative: 13 %
Lymphs Abs: 0.8 10*3/uL (ref 0.7–4.0)
MCH: 27 pg (ref 26.0–34.0)
MCHC: 31.9 g/dL (ref 30.0–36.0)
MCV: 84.8 fL (ref 80.0–100.0)
Monocytes Absolute: 0 10*3/uL — ABNORMAL LOW (ref 0.1–1.0)
Monocytes Relative: 0 %
Neutro Abs: 5.4 10*3/uL (ref 1.7–7.7)
Neutrophils Relative %: 83 %
Platelet Count: 209 10*3/uL (ref 150–400)
RBC: 3.81 MIL/uL — ABNORMAL LOW (ref 3.87–5.11)
RDW: 13.4 % (ref 11.5–15.5)
WBC Count: 6.3 10*3/uL (ref 4.0–10.5)
nRBC: 0 % (ref 0.0–0.2)

## 2020-03-28 LAB — CMP (CANCER CENTER ONLY)
ALT: 10 U/L (ref 0–44)
AST: 9 U/L — ABNORMAL LOW (ref 15–41)
Albumin: 3 g/dL — ABNORMAL LOW (ref 3.5–5.0)
Alkaline Phosphatase: 122 U/L (ref 38–126)
Anion gap: 3 — ABNORMAL LOW (ref 5–15)
BUN: 10 mg/dL (ref 6–20)
CO2: 29 mmol/L (ref 22–32)
Calcium: 8.8 mg/dL — ABNORMAL LOW (ref 8.9–10.3)
Chloride: 103 mmol/L (ref 98–111)
Creatinine: 0.66 mg/dL (ref 0.44–1.00)
GFR, Est AFR Am: >60 mL/min (ref >60)
GFR, Estimated: >60 mL/min (ref >60)
Glucose, Bld: 80 mg/dL (ref 70–99)
Potassium: 3.9 mmol/L (ref 3.5–5.1)
Sodium: 135 mmol/L (ref 135–145)
Total Bilirubin: 0.7 mg/dL (ref 0.3–1.2)
Total Protein: 6.1 g/dL — ABNORMAL LOW (ref 6.5–8.1)

## 2020-03-28 MED ORDER — HEPARIN SOD (PORK) LOCK FLUSH 100 UNIT/ML IV SOLN
500.0000 [IU] | Freq: Once | INTRAVENOUS | Status: AC
  Administered 2020-03-28: 500 [IU] via INTRAVENOUS
  Filled 2020-03-28: qty 5

## 2020-03-28 MED ORDER — TRAMADOL HCL 50 MG PO TABS: 50.0000 mg | ORAL_TABLET | Freq: Every day | ORAL | 0 refills | Status: DC | PRN

## 2020-03-28 MED ORDER — SODIUM CHLORIDE 0.9% FLUSH
10.0000 mL | INTRAVENOUS | Status: AC | PRN
  Administered 2020-03-28: 10 mL
  Filled 2020-03-28: qty 10

## 2020-03-28 MED ORDER — SODIUM CHLORIDE 0.9 % IV SOLN
Freq: Once | INTRAVENOUS | Status: AC
  Filled 2020-03-28: qty 250

## 2020-03-28 MED ORDER — TRAMADOL HCL 50 MG PO TABS
50.0000 mg | ORAL_TABLET | Freq: Once | ORAL | Status: AC
  Administered 2020-03-28: 50 mg via ORAL

## 2020-03-28 MED ORDER — SODIUM CHLORIDE 0.9% FLUSH
10.0000 mL | INTRAVENOUS | Status: AC | PRN
  Administered 2020-03-28: 10 mL via INTRAVENOUS
  Filled 2020-03-28: qty 10

## 2020-03-28 MED ORDER — TRAMADOL HCL 50 MG PO TABS
ORAL_TABLET | ORAL | Status: AC
  Filled 2020-03-28: qty 1

## 2020-03-28 NOTE — Patient Instructions (Addendum)
   Covid-19 Vaccination Clinic  Name:  Lizabeth Fellner    MRN: [id number] DOB: [birth year]  03/28/2020  Ms. Rettke was observed post Covid-19 immunization for 15 minutes without incident. She was provided with Vaccine Information Sheet and instruction to access the V-Safe system.   Ms. Birkhead was instructed to call 911 with any severe reactions post vaccine: Marland Kitchen Difficulty breathing  . Swelling of face and throat  . A fast heartbeat  . A bad rash all over body  . Dizziness and weakness   @[email or web address] @

## 2020-03-28 NOTE — Patient Instructions (Signed)

## 2020-03-28 NOTE — Progress Notes (Signed)
[facility]   [phone] [phone]   Clinic Follow up Note   Patient Care Team: Caitlyn Hendricks., MD as PCP - General (Family Medicine) Caitlyn Kaufmann, RN as Oncology Nurse Navigator Caitlyn Germany, RN as Oncology Nurse Navigator 03/28/2020  CHIEF COMPLAINT: Follow-up left breast cancer, toxicity check  SUMMARY OF ONCOLOGIC HISTORY: Oncology History Overview Note  Cancer Staging Cancer of central portion of left female breast [facility]) Staging form: Breast, AJCC 8th Edition - Clinical: No stage assigned - Unsigned    Cancer of central portion of left female breast ([place])  02/23/2020 Mammogram   Diagnostic Mammogram 02/23/20  IMPRESSION The 2x1x2.6cm irregular mass in teh left breast at 12:00 posiiton middle depth is highly suspicious of malignancy. An [place] is recommended for further evaluation and biopsy planning purposes.   02/23/2020 Breast US   [name] 02/23/20  IMPRESSION 2 adjacent spiculated masses in the left brast at 12:00 position 3 cm from the nipple (2.1x0.9x1.1cm and 1.1x1.4x0.5cm) is suggestive of malignancy.   Multiple abnormal left subpectoral and left axillary nodes measuring 3.3x2.1 cm concerning for metastatic adenopathy.    Left breast skin thickening and edema may be secondary congestive edema due to extensive axillary adenopathy.    03/08/2020 Initial Biopsy   Diagnosis 1.Breast, left, needle core biopsy, 12:00 position, 3cmfn -INVASIVE DUCTAL CARCINOMA -SEE COMMENT  2. Lymph node, needle/core biopsy, left axilla -METASTATIC CARCINOMA INVOLVING A LYMPH NODE  -LYMPHOVASCULAR SPACE INVASION PRESENT   Microscopic Comment  1.Based on the biopsy the carcinoma appears Nottingham Grade 3 or 3 and measures 1 cm in the greatest linear extent.    03/08/2020 Receptors her2   ER- Negative 0% PR - Negative 0% HER2 - Negative  KI 67 - 80%    03/08/2020 Cancer Staging   Staging form: Breast, AJCC 8th Edition - Clinical  stage from 03/08/2020: Stage IIIB (cT2, cN1, cM0, G3, ER-, PR-, HER2-) - Signed by Caitlyn Merle, MD on 03/10/2020   03/10/2020 Initial Diagnosis   Cancer of central portion of left female breast ([place])   03/16/2020 Breast MRI   IMPRESSION: 1. 8.1 x 7.8 x 6.6 cm biopsy proven invasive ductal carcinoma in the central right breast, involving 3 quadrants. 2. 3.0 x 1.7 x 1.1 cm satellite mass more inferiorly in lower inner quadrant of the left breast, compatible with additional malignancy. 3. Metastatic level 1 and level 2 left axillary lymph nodes. 4. No evidence of malignancy on the right.   03/17/2020 Imaging   IMPRESSION: CT CAP w contrast  1. Diffuse skin thickening in the left breast with left axillary and subpectoral lymphadenopathy, as well as a mildly enlarged left supraclavicular lymph node, which likely represents metastatic lymphadenopathy. No other definite extra nodal metastatic disease noted elsewhere in the chest, abdomen or pelvis. 2. Large mass in the central anatomic pelvis which is of uncertain origin, potentially a large exophytic fibroid or a large solid mass arising from the right ovary. Further evaluation with pelvic ultrasound is strongly recommended.   03/21/2020 Imaging   IMPRESSION: Apparent arthropathy at L5. No bony metastatic disease is demonstrable on this study. Scattered foci of abnormal uptake in a pelvic mass is of uncertain etiology given absence of calcification in this mass by CT. This mass compresses the urinary bladder. It is possible that some of the increased uptake in this area actually represents physiologic uptake within the bladder.   03/22/2020 -  Neo-Adjuvant Chemotherapy   Adriamycin and Cytoxan q2weeks for  4 cycles starting 03/22/20 followed by weekly Taxol and Carboplatin for 12 weeks   03/22/2020 -  Chemotherapy   The patient had dexamethasone (DECADRON) 4 MG tablet, 1 of 1 cycle, Start date: 03/22/2020, End date: -- DOXOrubicin (ADRIAMYCIN)  chemo injection 116 mg, 60 mg/m2 = 116 mg, Intravenous,  Once, 1 of 4 cycles Administration: 116 mg (03/22/2020) palonosetron (ALOXI) injection 0.25 mg, 0.25 mg, Intravenous,  Once, 1 of 8 cycles Administration: 0.25 mg (03/22/2020) pegfilgrastim-jmdb (FULPHILA) injection 6 mg, 6 mg, Subcutaneous,  Once, 1 of 4 cycles CARBOplatin (PARAPLATIN) in sodium chloride 0.9 % 100 mL chemo infusion, , Intravenous,  Once, 0 of 4 cycles cyclophosphamide (CYTOXAN) 1,160 mg in sodium chloride 0.9 % 250 mL chemo infusion, 600 mg/m2 = 1,160 mg, Intravenous,  Once, 1 of 4 cycles Administration: 1,160 mg (03/22/2020) PACLitaxel (TAXOL) 156 mg in sodium chloride 0.9 % 250 mL chemo infusion (</= 62m/m2), 80 mg/m2, Intravenous,  Once, 0 of 4 cycles fosaprepitant (EMEND) 150 mg in sodium chloride 0.9 % 145 mL IVPB, 150 mg, Intravenous,  Once, 1 of 8 cycles Administration: 150 mg (03/22/2020)  for chemotherapy treatment.      CURRENT THERAPY: Neoadjuvant chemo  INTERVAL HISTORY: Ms. PGrosshansreturns for follow-up as scheduled.  She received cycle 1 AC on 9/15 in [place] on 9/17. Steroids made her jittery, she took 2 tabs once daily in the morning x3 days. She has low energy. She felt well until receiving the G-CSF injection after which she developed a daily headache that progressively worsens throughout the day. Tylenol and ibuprofen have not helped. She notes some pressure in the sinuses and teeth pain. Headaches also began when she switched from taking Compazine to Zofran on day 3. She is not nauseated. She is eating and drinking. She had constipation for 3 days after chemo that resolved after drinking MiraLAX yesterday. P.o. intake is adequate. The left breast is less tender. Denies mucositis, rash, dysuria/hematuria, fever, chills, cough, chest pain, dyspnea.    MEDICAL HISTORY:  Past Medical History:  Diagnosis Date  . Anxiety   . Depression     SURGICAL HISTORY: Past Surgical History:  Procedure Laterality  Date  . PORTACATH PLACEMENT Right 03/21/2020   Procedure: INSERTION PORT-A-CATH WITH ULTRASOUND GUIDANCE;  Surgeon: WRolm Bookbinder MD;  Location: [facility]  Service: General;  Laterality: Right;    I have reviewed the social history and family history with the patient and they are unchanged from previous note.  ALLERGIES:  has No Known Allergies.  MEDICATIONS:  Current Outpatient Medications  Medication Sig Dispense Refill  . ALPRAZolam (XANAX) 0.5 MG tablet Take 0.5 mg by mouth 3 (three) times daily.    .Marland KitchenALPRAZolam (XANAX) 1 MG tablet Take 1 mg by mouth at bedtime as needed for anxiety.    . citalopram (CELEXA) 20 MG tablet Take 20 mg by mouth daily.    .Marland Kitchendexamethasone (DECADRON) 4 MG tablet Take 2 tablets by mouth daily starting the day after Carboplatin and Cytoxan x 3 days. Take with food. 30 tablet 1  . lidocaine-prilocaine (EMLA) cream Apply to affected area once 30 g 3  . ondansetron (ZOFRAN) 8 MG tablet Take 1 tablet (8 mg total) by mouth 2 (two) times daily as needed. Start on the third day after chemotherapy. 30 tablet 2  . prochlorperazine (COMPAZINE) 10 MG tablet Take 1 tablet (10 mg total) by mouth every 6 (six) hours as needed (Nausea or vomiting). 30 tablet 2  . sertraline (ZOLOFT)  50 MG tablet Take by mouth.    . zolpidem (AMBIEN) 5 MG tablet Take 1-2 tablets (5-10 mg total) by mouth at bedtime as needed for sleep. 30 tablet 0  . traMADol (ULTRAM) 50 MG tablet Take 1 tablet (50 mg total) by mouth daily as needed for severe pain (headache). 30 tablet 0   No current facility-administered medications for this visit.   Facility-Administered Medications Ordered in Other Visits  Medication Dose Route Frequency Provider Last Rate Last Admin  . sodium chloride flush (NS) 0.9 % injection 10 mL  10 mL Intravenous PRN Alla Feeling, NP   10 mL at 03/28/20 1104    PHYSICAL EXAMINATION: ECOG PERFORMANCE STATUS: 1 - Symptomatic but completely  ambulatory  Vitals:   03/28/20 0832  BP: 127/75  Pulse: 86  Resp: 18  Temp: 97.8 F (36.6 C)  SpO2: 100%   Filed Weights   03/28/20 0832  Weight: 186 lb 1.6 oz (84.4 kg)    GENERAL:alert, no distress and comfortable SKIN: Scattered small acne type eruptions to the upper chest neck and back EYES: sclera clear OROPHARYNX: No thrush or ulcers NECK: Without mass LUNGS: clear with normal breathing effort HEART: regular rate & rhythm, no lower extremity edema ABDOMEN:abdomen soft, non-tender and normal bowel sounds NEURO: alert & oriented x 3 with fluent speech Breast exam deferred PAC without erythema  LABORATORY DATA:  I have reviewed the data as listed CBC Latest Ref Rng & Units 03/28/2020 03/22/2020  WBC 4.0 - 10.5 K/uL 6.3 11.3(H)  Hemoglobin 12.0 - 15.0 g/dL 10.3(L) 11.7(L)  Hematocrit 36 - 46 % 32.3(L) 36.2  Platelets 150 - 400 K/uL 209 360     CMP Latest Ref Rng & Units 03/28/2020 03/22/2020  Glucose 70 - 99 mg/dL 80 85  BUN 6 - 20 mg/dL 10 13  Creatinine 0.44 - 1.00 mg/dL 0.66 0.80  Sodium 135 - 145 mmol/L 135 138  Potassium 3.5 - 5.1 mmol/L 3.9 3.6  Chloride 98 - 111 mmol/L 103 106  CO2 22 - 32 mmol/L 29 26  Calcium 8.9 - 10.3 mg/dL 8.8(L) 9.6  Total Protein 6.5 - 8.1 g/dL 6.1(L) 7.5  Total Bilirubin 0.3 - 1.2 mg/dL 0.7 0.4  Alkaline Phos 38 - 126 U/L 122 125  AST 15 - 41 U/L 9(L) 12(L)  ALT 0 - 44 U/L 10 10      RADIOGRAPHIC STUDIES: I have personally reviewed the radiological images as listed and agreed with the findings in the report. No results found.   ASSESSMENT & PLAN: Caitlyn Hendricks a 39 y.o. 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 Her ER/PR/HER2 markers were all negative.  -MRI on 03/16/2020 showed 8.1 x 7.8 x 6.6 cm biopsy-proven invasive ductal carcinoma in the central left breast involving 3 quadrants with a satellite mass inferiorly in the lower inner quadrant measuring 3.0 x 1.7 x 1.1 cm compatible with additional malignancy as well as metastatic level 1 and level 2 left axillary nodes.  The right breast is benign. -Staging work-up showed extensive left axillary and subpectoral lymphadenopathy and mildly enlarged left supraclavicular lymph node measuring 1 cm.  There was an incidental finding of  an indeterminate pelvic mass measuring 8.9 x 6.4 x 5.3 cm.  pelvic US on 03/24/20 showed benign fibroids. There is no evidence of pulmonary or osseous metastatic disease.  The bone scan was negative.  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  No additional biopsies are being recommended at this time. -Baseline echo was normal, EF 60-65% -Plan is to proceed with neoadjuvant chemotherapy ddAC q2 x4 followed by carboplatin d1 and taxol on days q,8,15,21 total 12 weeks, followed by definitive surgery.  We discussed the importance of adjuvant radiation which will include the subpectoral and supraclavicular regions. -s/p cycle 1 AC with GCSF on 03/22/20   2. Headache, constipation, low energy  -secondary to chemo, headaches likely related to chemo vs steroids vs anti-emetics  -improved with IVF and tramadol in clinic -I recommended to reduce steroids with cycle 2 and avoid zofran  -constipation resolved with miralax  3. Genetic Testing -Given her young age, Her MGM's breast cancer and father prostate cancer, she is eligible for genetic testing. She is agreeable, I will send referral.  -If she is found to have BRCA mutation, prophylactic b/l mastectomy and BSOwouldbe recommended and she would be a  candidate for PARP inhibitor after surgery  4. Anxiety/Depression, Social Support  -She is currently on Xanax 72m TID and Celexa 575m This is managed by her PCP. -She has been more anxious with her cancer diagnosis and would like dose increase. -She has been referred to social work, and CuTeacher, English as a foreign languageo see her today -She owns her DoDevelopment worker, international aidnd notes she does not plan to work during this treatment time.  She was given our financial advocate's contact information  Disposition: Ms. PrGoodhartppears stable. She is status post cycle 1 day 7 Adriamycin and Cytoxan with G-CSF. She has tolerated moderately well thus far with low energy, constipation, and headaches. We reviewed symptom management. Side effects are partially managed with supportive care at home.   Her headache is likely related to chemo, steroids, vs ondansetron.  She appears hydrated with adequate nutrition. Headache improved with IVF and tramadol in clinic. She is being given a prescription for tramadol in the event she develops recurrent severe headache. I encouraged her to hydrate well and avoid zofran. Will reduce post-chemo dex to 4 mg once daily x2-3 days with next cycle. She is otherwise able to recover and function well.   We reviewed the CBC and CMP from today which shows mild anemia, no thrombocytopenia or neutropenia.  She will receive first covid19 vaccine. I have placed a call to our FMLA rep regarding her mother's paper work.   She will return for follow-up and cycle 2 next week.  All questions were answered. The patient knows to call the clinic with any problems, questions or concerns. No barriers to learning were detected.  Total encounter time was 30 minutes.     LaAlla FeelingNP 03/28/20

## 2020-03-30 ENCOUNTER — Telehealth: Payer: Self-pay | Admitting: Nurse Practitioner

## 2020-03-30 ENCOUNTER — Encounter: Payer: Self-pay | Admitting: Hematology

## 2020-03-30 NOTE — Telephone Encounter (Signed)
No 9/21 los 

## 2020-03-30 NOTE — Progress Notes (Signed)
Calledpt to introduce myself as Teacher, adult education andtodiscuss copay assistance. Pt gave me consent to apply in herbehalf so I completed theonlineapplicationwith Administrator for [employer].  The app is pending so I will notify herof the outcome once I receive it.  Pt is overqualified for the [employer].  Iwillgiveher my cardon 9/29/21for any questions or concernsshe may have in the future.

## 2020-04-02 ENCOUNTER — Encounter: Payer: Self-pay | Admitting: Nurse Practitioner

## 2020-04-03 ENCOUNTER — Encounter: Payer: Self-pay | Admitting: *Deleted

## 2020-04-03 ENCOUNTER — Encounter: Payer: 59 | Admitting: Genetic Counselor

## 2020-04-03 ENCOUNTER — Telehealth: Payer: Self-pay

## 2020-04-03 NOTE — Telephone Encounter (Signed)
Spoke with pt concerning included message   I have about 10 little bumps under my breast on the rib cage that are itchy on surface but painful deeper in skin when I take a breath. Kinda like muscle pain.    Patient states her rash and pain is improving offered to give patient an appointment for tomorrow with Regan Rakers or Lucianne Lei patient declined stating she would be here Wednesday and feels better

## 2020-04-04 NOTE — Progress Notes (Signed)
[facility]   [phone] [phone]   Clinic Follow up Note   Patient Care Team: [name], MD as PCP - General (Family Medicine) Mauro Kaufmann, RN as Oncology Nurse Navigator Rockwell Germany, RN as Oncology Nurse Navigator 04/05/2020  CHIEF COMPLAINT: F/u left breast cancer  SUMMARY OF ONCOLOGIC HISTORY: Oncology History Overview Note  Cancer Staging Cancer of central portion of left female breast [facility]) Staging form: Breast, AJCC 8th Edition - Clinical: No stage assigned - Unsigned    Cancer of central portion of left female breast ([place])  02/23/2020 Mammogram   Diagnostic Mammogram 02/23/20  IMPRESSION The 2x1x2.6cm irregular mass in teh left breast at 12:00 posiiton middle depth is highly suspicious of malignancy. An [place] is recommended for further evaluation and biopsy planning purposes.   02/23/2020 Breast US   [name] 02/23/20  IMPRESSION 2 adjacent spiculated masses in the left brast at 12:00 position 3 cm from the nipple (2.1x0.9x1.1cm and 1.1x1.4x0.5cm) is suggestive of malignancy.   Multiple abnormal left subpectoral and left axillary nodes measuring 3.3x2.1 cm concerning for metastatic adenopathy.    Left breast skin thickening and edema may be secondary congestive edema due to extensive axillary adenopathy.    03/08/2020 Initial Biopsy   Diagnosis 1.Breast, left, needle core biopsy, 12:00 position, 3cmfn -INVASIVE DUCTAL CARCINOMA -SEE COMMENT  2. Lymph node, needle/core biopsy, left axilla -METASTATIC CARCINOMA INVOLVING A LYMPH NODE  -LYMPHOVASCULAR SPACE INVASION PRESENT   Microscopic Comment  1.Based on the biopsy the carcinoma appears Nottingham Grade 3 or 3 and measures 1 cm in the greatest linear extent.    03/08/2020 Receptors her2   ER- Negative 0% PR - Negative 0% HER2 - Negative  KI 67 - 80%    03/08/2020 Cancer Staging   Staging form: Breast, AJCC 8th Edition - Clinical stage from 03/08/2020:  Stage IIIB (cT2, cN1, cM0, G3, ER-, PR-, HER2-) - Signed by Truitt Merle, MD on 03/10/2020   03/10/2020 Initial Diagnosis   Cancer of central portion of left female breast ([place])   03/16/2020 Breast MRI   IMPRESSION: 1. 8.1 x 7.8 x 6.6 cm biopsy proven invasive ductal carcinoma in the central right breast, involving 3 quadrants. 2. 3.0 x 1.7 x 1.1 cm satellite mass more inferiorly in lower inner quadrant of the left breast, compatible with additional malignancy. 3. Metastatic level 1 and level 2 left axillary lymph nodes. 4. No evidence of malignancy on the right.   03/17/2020 Imaging   IMPRESSION: CT CAP w contrast  1. Diffuse skin thickening in the left breast with left axillary and subpectoral lymphadenopathy, as well as a mildly enlarged left supraclavicular lymph node, which likely represents metastatic lymphadenopathy. No other definite extra nodal metastatic disease noted elsewhere in the chest, abdomen or pelvis. 2. Large mass in the central anatomic pelvis which is of uncertain origin, potentially a large exophytic fibroid or a large solid mass arising from the right ovary. Further evaluation with pelvic ultrasound is strongly recommended.   03/21/2020 Imaging   IMPRESSION: Apparent arthropathy at L5. No bony metastatic disease is demonstrable on this study. Scattered foci of abnormal uptake in a pelvic mass is of uncertain etiology given absence of calcification in this mass by CT. This mass compresses the urinary bladder. It is possible that some of the increased uptake in this area actually represents physiologic uptake within the bladder.   03/22/2020 -  Neo-Adjuvant Chemotherapy   Adriamycin and Cytoxan q2weeks for 4 cycles  starting 03/22/20 followed by weekly Taxol and Carboplatin for 12 weeks   03/22/2020 -  Chemotherapy   The patient had dexamethasone (DECADRON) 4 MG tablet, 1 of 1 cycle, Start date: 03/22/2020, End date: -- DOXOrubicin (ADRIAMYCIN) chemo injection 116 mg,  60 mg/m2 = 116 mg, Intravenous,  Once, 2 of 4 cycles Administration: 116 mg (03/22/2020) palonosetron (ALOXI) injection 0.25 mg, 0.25 mg, Intravenous,  Once, 2 of 8 cycles Administration: 0.25 mg (03/22/2020) pegfilgrastim-jmdb (FULPHILA) injection 6 mg, 6 mg, Subcutaneous,  Once, 2 of 4 cycles Administration: 6 mg (03/24/2020) CARBOplatin (PARAPLATIN) in sodium chloride 0.9 % 100 mL chemo infusion, , Intravenous,  Once, 0 of 4 cycles cyclophosphamide (CYTOXAN) 1,160 mg in sodium chloride 0.9 % 250 mL chemo infusion, 600 mg/m2 = 1,160 mg, Intravenous,  Once, 2 of 4 cycles Administration: 1,160 mg (03/22/2020) PACLitaxel (TAXOL) 156 mg in sodium chloride 0.9 % 250 mL chemo infusion (</= 9m/m2), 80 mg/m2, Intravenous,  Once, 0 of 4 cycles fosaprepitant (EMEND) 150 mg in sodium chloride 0.9 % 145 mL IVPB, 150 mg, Intravenous,  Once, 2 of 8 cycles Administration: 150 mg (03/22/2020)  for chemotherapy treatment.      CURRENT THERAPY: neoadjuvant chemo ddAC starting 9/15, to be followed by weekly carbo/taxol the definitive surgery  INTERVAL HISTORY: [name] for f/u and chemo as scheduled. She was seen 9/21 for toxicity check and received COVID19 vaccine same day. She had a low grade fever T max 99.5 and headache for 2 days after. She sent a message 9/26 reporting 10 painful bumps under left breast but declined an office visit.  She applied lidocaine and bumps improved.  She notes tightness at her left rib cage and neck when she takes a deep breath and holds it.  This was similar to before she began treatment.  No cough, chest pain, dyspnea.  She felt well this week.  Able to eat and drink, good energy level.  No nausea or vomiting.  Bowels moving normally.  She reportedly developed strep throat last weekend, called the cancer center and was given an antibiotic although there is no documentation note or new antibiotic on her med list.  Per patient she is on amoxicillin, redness and "pus" in her  throat resolved after 1 dose.     MEDICAL HISTORY:  Past Medical History:  Diagnosis Date   Anxiety    Depression     SURGICAL HISTORY: Past Surgical History:  Procedure Laterality Date   PORTACATH PLACEMENT Right 03/21/2020   Procedure: INSERTION PORT-A-CATH WITH ULTRASOUND GUIDANCE;  Surgeon: WRolm Bookbinder MD;  [place] [facility]  Service: General;  Laterality: Right;    I have reviewed the social history and family history with the patient and they are unchanged from previous note.  ALLERGIES:  has No Known Allergies.  MEDICATIONS:  Current Outpatient Medications  Medication Sig Dispense Refill   ALPRAZolam (XANAX) 1 MG tablet Take 1 mg by mouth 3 (three) times daily as needed for anxiety.      ciprofloxacin (CIPRO) 500 MG tablet SMARTSIG:1 Tablet(s) By Mouth Every 12 Hours     citalopram (CELEXA) 20 MG tablet Take 20 mg by mouth daily.     dexamethasone (DECADRON) 4 MG tablet Take 2 tablets by mouth daily starting the day after Carboplatin and Cytoxan x 3 days. Take with food. 30 tablet 1   lidocaine-prilocaine (EMLA) cream Apply to affected area once 30 g 3   ondansetron (ZOFRAN) 8 MG tablet Take 1 tablet (8  mg total) by mouth 2 (two) times daily as needed. Start on the third day after chemotherapy. 30 tablet 2   prochlorperazine (COMPAZINE) 10 MG tablet Take 1 tablet (10 mg total) by mouth every 6 (six) hours as needed (Nausea or vomiting). 30 tablet 2   sertraline (ZOLOFT) 50 MG tablet Take by mouth.     traMADol (ULTRAM) 50 MG tablet Take 1 tablet (50 mg total) by mouth daily as needed for severe pain (headache). 30 tablet 0   zolpidem (AMBIEN) 5 MG tablet Take 1-2 tablets (5-10 mg total) by mouth at bedtime as needed for sleep. 30 tablet 0   ALPRAZolam (XANAX) 0.5 MG tablet Take 0.5 mg by mouth 3 (three) times daily.     No current facility-administered medications for this visit.   Facility-Administered Medications Ordered in  Other Visits  Medication Dose Route Frequency Provider Last Rate Last Admin   cyclophosphamide (CYTOXAN) 1,160 mg in sodium chloride 0.9 % 250 mL chemo infusion  600 mg/m2 (Treatment Plan Recorded) Intravenous Once Truitt Merle, MD 411 mL/hr at 04/05/20 1618 1,160 mg at 04/05/20 1618   heparin lock flush 100 unit/mL  500 Units Intracatheter Once PRN Truitt Merle, MD       sodium chloride flush (NS) 0.9 % injection 10 mL  10 mL Intravenous PRN Alla Feeling, NP   10 mL at 03/28/20 1104   sodium chloride flush (NS) 0.9 % injection 10 mL  10 mL Intracatheter PRN Truitt Merle, MD        PHYSICAL EXAMINATION: ECOG PERFORMANCE STATUS: 0 - Asymptomatic  Vitals:   04/05/20 1305  BP: 120/79  Pulse: 93  Resp: 18  Temp: 98.4 F (36.9 C)  SpO2: 100%   Filed Weights   04/05/20 1305  Weight: 178 lb 12.8 oz (81.1 kg)    GENERAL:alert, no distress and comfortable SKIN: Small scattered acne type eruptions to the chest, trunk, left upper abdomen, scalp EYES: sclera clear OROPHARYNX: No thrush, ulcers, or pharyngeal erythema NECK: Without mass LUNGS:  normal breathing effort HEART: no lower extremity edema Musculoskeletal: No focal tenderness NEURO: alert & oriented x 3 with fluent speech PAC without erythema  Breast exam: The left breast edema has improved, the palpable mass in the upper left breast is softer, smaller measuring 5 x 4 cm.   LABORATORY DATA:  I have reviewed the data as listed CBC Latest Ref Rng & Units 04/05/2020 03/28/2020 03/22/2020  WBC 4.0 - 10.5 K/uL 9.0 6.3 11.3(H)  Hemoglobin 12.0 - 15.0 g/dL 11.3(L) 10.3(L) 11.7(L)  Hematocrit 36 - 46 % 35.3(L) 32.3(L) 36.2  Platelets 150 - 400 K/uL 279 209 360     CMP Latest Ref Rng & Units 04/05/2020 03/28/2020 03/22/2020  Glucose 70 - 99 mg/dL 79 80 85  BUN 6 - 20 mg/dL 13 10 13   Creatinine 0.44 - 1.00 mg/dL 0.77 0.66 0.80  Sodium 135 - 145 mmol/L 137 135 138  Potassium 3.5 - 5.1 mmol/L 4.1 3.9 3.6  Chloride 98 - 111 mmol/L 100  103 106  CO2 22 - 32 mmol/L 29 29 26   Calcium 8.9 - 10.3 mg/dL 9.6 8.8(L) 9.6  Total Protein 6.5 - 8.1 g/dL 7.4 6.1(L) 7.5  Total Bilirubin 0.3 - 1.2 mg/dL <0.2(L) 0.7 0.4  Alkaline Phos 38 - 126 U/L 161(H) 122 125  AST 15 - 41 U/L 17 9(L) 12(L)  ALT 0 - 44 U/L 24 10 10       RADIOGRAPHIC STUDIES: I have personally reviewed the  radiological images as listed and agreed with the findings in the report. No results found.   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 Her ER/PR/HER2 markers were all negative.  -MRI on 03/16/2020 showed 8.1 x 7.8 x 6.6 cm biopsy-proven invasive ductal carcinoma in the central left breast involving 3 quadrants with a satellite mass inferiorly in the lower inner quadrant measuring 3.0 x 1.7 x 1.1 cm compatible with additional malignancy as well as metastatic level 1 and level 2 left axillary nodes. The right breast is benign. -Staging work-up showed extensive left axillary and subpectoral lymphadenopathy and mildly enlarged left supraclavicular lymph node measuring 1 cm. There was an incidental finding of an indeterminate pelvic mass measuring 8.9 x 6.4 x 5.3 cm.pelvic US on 03/24/20 showed benign fibroids. There is no evidence of pulmonary or osseous metastatic disease. The bone scan was negative. 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 -Baseline echo was normal, EF 60-65% -Plan is to proceed with neoadjuvant  chemotherapyddAC q2 x4 followed by carboplatin d1 and taxol on days q,8,15,21 total 12 weeks,followed by definitive surgery. We discussed the importance of adjuvant radiation which will include the subpectoral and supraclavicular regions. -s/p cycle 1 AC with GCSF on 03/22/20, tolerated with fatigue, constipation, and headaches.  Recovered well  2. Headache, constipation, low energy  -With cycle 1 AC; headaches likely related to chemo vs steroids vs anti-emetics. Improved with IVF and tramadol in clinic -I recommended to reduce steroids with cycle 2 and avoid zofran  -constipation resolved with miralax  -recovered well   3. Genetic Testing -Given her young age, Her MGM's breast cancer and father prostate cancer, she is eligible for genetic testing.  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 Anxiety/Depression, Social Support  -She is currently on Xanax 70m TID and Celexa 530m This is managed by her PCP. -She has been more anxious with her cancer diagnosis and would like dose increase. -She has been referred to social work, and CuTeacher, English as a foreign languageShe owns her DoDevelopment worker, international aidnd notes she does not plan to work during this treatment time.She was given our financial advocate's contact information  Disposition: Ms. PrNorkusppears stable.  She recovered well from cycle 1 AC.  Today's breast exam shows a softer, smaller left breast mass consistent with a response to treatment.  She has a mild scattered acne type rash on her chest, trunk, scalp likely from steroids.  I recommend to reduce Dex to 4 mg once daily x2-3 days with cycle 2 due to her previous headaches.  We reviewed her genetic report which is negative for pathogenetic mutation, VUS in POLE.  We reviewed the CBC and CMP from today.  Labs adequate to proceed with cycle 2 AC with Fulphila on day 3 as planned.   She will return for follow-up and cycle 3 AC in 2 weeks.    All questions were answered. The patient knows to call the clinic with any problems, questions or concerns. No barriers to learning were detected.  Alla Feeling, NP 04/05/20

## 2020-04-05 ENCOUNTER — Inpatient Hospital Stay: Payer: 59

## 2020-04-05 ENCOUNTER — Other Ambulatory Visit: Payer: Self-pay

## 2020-04-05 ENCOUNTER — Inpatient Hospital Stay: Payer: 59 | Admitting: General Practice

## 2020-04-05 ENCOUNTER — Encounter: Payer: Self-pay | Admitting: *Deleted

## 2020-04-05 ENCOUNTER — Inpatient Hospital Stay (HOSPITAL_BASED_OUTPATIENT_CLINIC_OR_DEPARTMENT_OTHER): Payer: 59 | Admitting: Nurse Practitioner

## 2020-04-05 ENCOUNTER — Encounter: Payer: Self-pay | Admitting: Nurse Practitioner

## 2020-04-05 VITALS — BP 120/79 | HR 93 | Temp 98.4°F | Resp 18 | Wt 178.8 lb

## 2020-04-05 DIAGNOSIS — Z171 Estrogen receptor negative status [ER-]: Secondary | ICD-10-CM

## 2020-04-05 DIAGNOSIS — C50112 Malignant neoplasm of central portion of left female breast: Secondary | ICD-10-CM | POA: Diagnosis not present

## 2020-04-05 DIAGNOSIS — Z5111 Encounter for antineoplastic chemotherapy: Secondary | ICD-10-CM | POA: Diagnosis not present

## 2020-04-05 LAB — CMP (CANCER CENTER ONLY)
ALT: 24 U/L (ref 0–44)
AST: 17 U/L (ref 15–41)
Albumin: 3.6 g/dL (ref 3.5–5.0)
Alkaline Phosphatase: 161 U/L — ABNORMAL HIGH (ref 38–126)
Anion gap: 8 (ref 5–15)
BUN: 13 mg/dL (ref 6–20)
CO2: 29 mmol/L (ref 22–32)
Calcium: 9.6 mg/dL (ref 8.9–10.3)
Chloride: 100 mmol/L (ref 98–111)
Creatinine: 0.77 mg/dL (ref 0.44–1.00)
GFR, Est AFR Am: >60 mL/min (ref >60)
GFR, Estimated: >60 mL/min (ref >60)
Glucose, Bld: 79 mg/dL (ref 70–99)
Potassium: 4.1 mmol/L (ref 3.5–5.1)
Sodium: 137 mmol/L (ref 135–145)
Total Bilirubin: <0.2 mg/dL — ABNORMAL LOW (ref 0.3–1.2)
Total Protein: 7.4 g/dL (ref 6.5–8.1)

## 2020-04-05 LAB — CBC WITH DIFFERENTIAL (CANCER CENTER ONLY)
Abs Immature Granulocytes: 0.5 10*3/uL — ABNORMAL HIGH (ref 0.00–0.07)
Basophils Absolute: 0.1 10*3/uL (ref 0.0–0.1)
Basophils Relative: 1 %
Eosinophils Absolute: 0 10*3/uL (ref 0.0–0.5)
Eosinophils Relative: 0 %
HCT: 35.3 % — ABNORMAL LOW (ref 36.0–46.0)
Hemoglobin: 11.3 g/dL — ABNORMAL LOW (ref 12.0–15.0)
Immature Granulocytes: 6 %
Lymphocytes Relative: 14 %
Lymphs Abs: 1.3 10*3/uL (ref 0.7–4.0)
MCH: 26.7 pg (ref 26.0–34.0)
MCHC: 32 g/dL (ref 30.0–36.0)
MCV: 83.5 fL (ref 80.0–100.0)
Monocytes Absolute: 0.8 10*3/uL (ref 0.1–1.0)
Monocytes Relative: 9 %
Neutro Abs: 6.4 10*3/uL (ref 1.7–7.7)
Neutrophils Relative %: 70 %
Platelet Count: 279 10*3/uL (ref 150–400)
RBC: 4.23 MIL/uL (ref 3.87–5.11)
RDW: 13.3 % (ref 11.5–15.5)
WBC Count: 9 10*3/uL (ref 4.0–10.5)
nRBC: 0 % (ref 0.0–0.2)

## 2020-04-05 MED ORDER — SODIUM CHLORIDE 0.9 % IV SOLN
10.0000 mg | Freq: Once | INTRAVENOUS | Status: AC
  Administered 2020-04-05: 10 mg via INTRAVENOUS
  Filled 2020-04-05: qty 10

## 2020-04-05 MED ORDER — SODIUM CHLORIDE 0.9 % IV SOLN
600.0000 mg/m2 | Freq: Once | INTRAVENOUS | Status: AC
  Administered 2020-04-05: 1160 mg via INTRAVENOUS
  Filled 2020-04-05: qty 58

## 2020-04-05 MED ORDER — SODIUM CHLORIDE 0.9 % IV SOLN
150.0000 mg | Freq: Once | INTRAVENOUS | Status: AC
  Administered 2020-04-05: 150 mg via INTRAVENOUS
  Filled 2020-04-05: qty 150

## 2020-04-05 MED ORDER — SODIUM CHLORIDE 0.9 % IV SOLN
Freq: Once | INTRAVENOUS | Status: AC
  Filled 2020-04-05: qty 250

## 2020-04-05 MED ORDER — PALONOSETRON HCL INJECTION 0.25 MG/5ML
INTRAVENOUS | Status: AC
  Filled 2020-04-05: qty 5

## 2020-04-05 MED ORDER — PALONOSETRON HCL INJECTION 0.25 MG/5ML
0.2500 mg | Freq: Once | INTRAVENOUS | Status: AC
  Administered 2020-04-05: 0.25 mg via INTRAVENOUS

## 2020-04-05 MED ORDER — SODIUM CHLORIDE 0.9% FLUSH
10.0000 mL | INTRAVENOUS | Status: DC | PRN
  Administered 2020-04-05: 10 mL
  Filled 2020-04-05: qty 10

## 2020-04-05 MED ORDER — HEPARIN SOD (PORK) LOCK FLUSH 100 UNIT/ML IV SOLN
500.0000 [IU] | Freq: Once | INTRAVENOUS | Status: AC | PRN
  Administered 2020-04-05: 500 [IU]
  Filled 2020-04-05: qty 5

## 2020-04-05 MED ORDER — DOXORUBICIN HCL CHEMO IV INJECTION 2 MG/ML
60.0000 mg/m2 | Freq: Once | INTRAVENOUS | Status: AC
  Administered 2020-04-05: 116 mg via INTRAVENOUS
  Filled 2020-04-05: qty 58

## 2020-04-05 NOTE — Patient Instructions (Signed)
[facility] Discharge Instructions for Patients Receiving Chemotherapy  Today you received the following chemotherapy agents Adriamycin; Cytoxan  To help prevent nausea and vomiting after your treatment, we encourage you to take your nausea medication as directed   If you develop nausea and vomiting that is not controlled by your nausea medication, call the clinic.   BELOW ARE SYMPTOMS THAT SHOULD BE REPORTED IMMEDIATELY:  *FEVER GREATER THAN 100.5 F  *CHILLS WITH OR WITHOUT FEVER  NAUSEA AND VOMITING THAT IS NOT CONTROLLED WITH YOUR NAUSEA MEDICATION  *UNUSUAL SHORTNESS OF BREATH  *UNUSUAL BRUISING OR BLEEDING  TENDERNESS IN MOUTH AND THROAT WITH OR WITHOUT PRESENCE OF ULCERS  *URINARY PROBLEMS  *BOWEL PROBLEMS  UNUSUAL RASH Items with * indicate a potential emergency and should be followed up as soon as possible.  Feel free to call the clinic should you have any questions or concerns. The clinic phone number is [phone].  Please show the CHEMO ALERT CARD at check-in to the Emergency Department and triage nurse.   

## 2020-04-07 ENCOUNTER — Other Ambulatory Visit: Payer: Self-pay

## 2020-04-07 ENCOUNTER — Inpatient Hospital Stay: Payer: 59 | Attending: Hematology

## 2020-04-07 VITALS — BP 126/87 | HR 71 | Temp 98.5°F | Resp 18

## 2020-04-07 DIAGNOSIS — R110 Nausea: Secondary | ICD-10-CM | POA: Insufficient documentation

## 2020-04-07 DIAGNOSIS — Z5111 Encounter for antineoplastic chemotherapy: Secondary | ICD-10-CM | POA: Diagnosis not present

## 2020-04-07 DIAGNOSIS — Z5112 Encounter for antineoplastic immunotherapy: Secondary | ICD-10-CM | POA: Insufficient documentation

## 2020-04-07 DIAGNOSIS — K5900 Constipation, unspecified: Secondary | ICD-10-CM | POA: Diagnosis not present

## 2020-04-07 DIAGNOSIS — F419 Anxiety disorder, unspecified: Secondary | ICD-10-CM | POA: Diagnosis not present

## 2020-04-07 DIAGNOSIS — Z171 Estrogen receptor negative status [ER-]: Secondary | ICD-10-CM

## 2020-04-07 DIAGNOSIS — Z79899 Other long term (current) drug therapy: Secondary | ICD-10-CM | POA: Insufficient documentation

## 2020-04-07 DIAGNOSIS — R21 Rash and other nonspecific skin eruption: Secondary | ICD-10-CM | POA: Insufficient documentation

## 2020-04-07 DIAGNOSIS — Z7689 Persons encountering health services in other specified circumstances: Secondary | ICD-10-CM | POA: Insufficient documentation

## 2020-04-07 DIAGNOSIS — F32A Depression, unspecified: Secondary | ICD-10-CM | POA: Diagnosis not present

## 2020-04-07 DIAGNOSIS — R12 Heartburn: Secondary | ICD-10-CM | POA: Diagnosis not present

## 2020-04-07 DIAGNOSIS — Z5189 Encounter for other specified aftercare: Secondary | ICD-10-CM | POA: Insufficient documentation

## 2020-04-07 DIAGNOSIS — Z23 Encounter for immunization: Secondary | ICD-10-CM | POA: Insufficient documentation

## 2020-04-07 DIAGNOSIS — C773 Secondary and unspecified malignant neoplasm of axilla and upper limb lymph nodes: Secondary | ICD-10-CM | POA: Insufficient documentation

## 2020-04-07 DIAGNOSIS — C50112 Malignant neoplasm of central portion of left female breast: Secondary | ICD-10-CM | POA: Diagnosis present

## 2020-04-07 MED ORDER — PEGFILGRASTIM-JMDB 6 MG/0.6ML ~~LOC~~ SOSY
6.0000 mg | PREFILLED_SYRINGE | Freq: Once | SUBCUTANEOUS | Status: AC
  Administered 2020-04-07: 6 mg via SUBCUTANEOUS

## 2020-04-07 MED ORDER — PEGFILGRASTIM-JMDB 6 MG/0.6ML ~~LOC~~ SOSY
PREFILLED_SYRINGE | SUBCUTANEOUS | Status: AC
  Filled 2020-04-07: qty 0.6

## 2020-04-07 NOTE — Patient Instructions (Signed)

## 2020-04-14 NOTE — Progress Notes (Signed)
Caitlyn Hendricks   [phone] [phone]   Clinic Follow up Note   Patient Care Team: [name], MD as PCP - General (Family Medicine) Mauro Kaufmann, RN as Oncology Nurse Navigator Rockwell Germany, RN as Oncology Nurse Navigator  Date of Service:  04/19/2020  CHIEF COMPLAINT: F/u of left breast cancer   SUMMARY OF ONCOLOGIC HISTORY: Oncology History Overview Note  Cancer Staging Cancer of central portion of left female breast [facility]) Staging form: Breast, AJCC 8th Edition - Clinical: No stage assigned - Unsigned    Cancer of central portion of left female breast ([place])  02/23/2020 Mammogram   Diagnostic Mammogram 02/23/20  IMPRESSION The 2x1x2.6cm irregular mass in teh left breast at 12:00 posiiton middle depth is highly suspicious of malignancy. An [place] is recommended for further evaluation and biopsy planning purposes.   02/23/2020 Breast US   [name] 02/23/20  IMPRESSION 2 adjacent spiculated masses in the left brast at 12:00 position 3 cm from the nipple (2.1x0.9x1.1cm and 1.1x1.4x0.5cm) is suggestive of malignancy.   Multiple abnormal left subpectoral and left axillary nodes measuring 3.3x2.1 cm concerning for metastatic adenopathy.    Left breast skin thickening and edema may be secondary congestive edema due to extensive axillary adenopathy.    03/08/2020 Initial Biopsy   Diagnosis 1.Breast, left, needle core biopsy, 12:00 position, 3cmfn -INVASIVE DUCTAL CARCINOMA -SEE COMMENT  2. Lymph node, needle/core biopsy, left axilla -METASTATIC CARCINOMA INVOLVING A LYMPH NODE  -LYMPHOVASCULAR SPACE INVASION PRESENT   Microscopic Comment  1.Based on the biopsy the carcinoma appears Nottingham Grade 3 or 3 and measures 1 cm in the greatest linear extent.    03/08/2020 Receptors her2   ER- Negative 0% PR - Negative 0% HER2 - Negative  KI 67 - 80%    03/08/2020 Cancer Staging   Staging form: Breast, AJCC 8th Edition - Clinical  stage from 03/08/2020: Stage IIIB (cT2, cN1, cM0, G3, ER-, PR-, HER2-) - Signed by Truitt Merle, MD on 03/10/2020   03/10/2020 Initial Diagnosis   Cancer of central portion of left female breast ([place])   03/16/2020 Breast MRI   IMPRESSION: 1. 8.1 x 7.8 x 6.6 cm biopsy proven invasive ductal carcinoma in the central right breast, involving 3 quadrants. 2. 3.0 x 1.7 x 1.1 cm satellite mass more inferiorly in lower inner quadrant of the left breast, compatible with additional malignancy. 3. Metastatic level 1 and level 2 left axillary lymph nodes. 4. No evidence of malignancy on the right.   03/17/2020 Imaging   IMPRESSION: CT CAP w contrast  1. Diffuse skin thickening in the left breast with left axillary and subpectoral lymphadenopathy, as well as a mildly enlarged left supraclavicular lymph node, which likely represents metastatic lymphadenopathy. No other definite extra nodal metastatic disease noted elsewhere in the chest, abdomen or pelvis. 2. Large mass in the central anatomic pelvis which is of uncertain origin, potentially a large exophytic fibroid or a large solid mass arising from the right ovary. Further evaluation with pelvic ultrasound is strongly recommended.   03/21/2020 Imaging   Bone Scan  IMPRESSION: Apparent arthropathy at L5. No bony metastatic disease is demonstrable on this study. Scattered foci of abnormal uptake in a pelvic mass is of uncertain etiology given absence of calcification in this mass by CT. This mass compresses the urinary bladder. It is possible that some of the increased uptake in this area actually represents physiologic uptake within the bladder.   Kidneys noted in flank  positions bilaterally.     03/22/2020 -  Neo-Adjuvant Chemotherapy   Neoadjuvant Adriamycin and Cytoxan q2weeks for 4 cycles starting 03/22/20-05/03/20 followed by weekly Taxol and Carboplatin for 12 weeks starting 05/17/20   03/24/2020 Imaging   US Pelvis  IMPRESSION: 1. Large  pedunculated lesion directly contiguous with the uterine most suggestive of a large subserosal fibroid which measures up to 8.2 cm. 2. Additional 1 cm probable intramural fibroid in the right anterior uterine body. 3. No other acute or worrisome pelvic abnormality.   04/26/2020 -  Antibody Plan   Added Keytruda q3weeks starting 04/26/20 to complete 1 year of treatment       CURRENT THERAPY:  Neoadjuvant chemo ddAC q2weeks for 4 cycles starting 03/22/20-05/03/20, followed by weekly carbo/taxol for 12 weeks.   -Added Keytruda q3weeks to complete 1 year of treatment   INTERVAL HISTORY:  Caitlyn Hendricks is here for a follow up. She presents to the clinic with her boyfriend Zack. She notes her first 2 cycles of chemo went well. She notes no nausea but has constipation. She has miralax. She stopped zofran due to headaches. She notes she had a skin rash which she attributes to new clothes from offline. This improved with prednisone. She notes on chemo her breast pain improved. She notes her breast mass has gotten smaller. She notes she has adequate appetite but energy is low for 4 days. She is able to recover well. She works part time.     REVIEW OF SYSTEMS:   Constitutional: Denies fevers, chills or abnormal weight loss Eyes: Denies blurriness of vision Ears, nose, mouth, throat, and face: Denies mucositis or sore throat Respiratory: Denies cough, dyspnea or wheezes Cardiovascular: Denies palpitation, chest discomfort or lower extremity swelling Gastrointestinal:  Denies nausea, heartburn (+) Constipation  Skin: (+) Resolving skin rash of chest and scalp Lymphatics: Denies new lymphadenopathy or easy bruising Neurological:Denies numbness, tingling or new weaknesses Behavioral/Psych: Mood is stable, no new changes  All other systems were reviewed with the patient and are negative.  MEDICAL HISTORY:  Past Medical History:  Diagnosis Date  . Anxiety   . Depression     SURGICAL  HISTORY: Past Surgical History:  Procedure Laterality Date  . PORTACATH PLACEMENT Right 03/21/2020   Procedure: INSERTION PORT-A-CATH WITH ULTRASOUND GUIDANCE;  Surgeon: [name], MD;  Location: [place];  Service: General;  Laterality: Right;    I have reviewed the social history and family history with the patient and they are unchanged from previous note.  ALLERGIES:  is allergic to medroxyprogesterone.  MEDICATIONS:  Current Outpatient Medications  Medication Sig Dispense Refill  . ALPRAZolam (XANAX) 0.5 MG tablet Take 0.5 mg by mouth 3 (three) times daily.    Marland Kitchen ALPRAZolam (XANAX) 1 MG tablet Take 1 mg by mouth 3 (three) times daily as needed for anxiety.     . ciprofloxacin (CIPRO) 500 MG tablet SMARTSIG:1 Tablet(s) By Mouth Every 12 Hours    . citalopram (CELEXA) 20 MG tablet Take 20 mg by mouth daily.    Marland Kitchen dexamethasone (DECADRON) 4 MG tablet Take 2 tablets by mouth daily starting the day after Carboplatin and Cytoxan x 3 days. Take with food. 30 tablet 1  . famciclovir (FAMVIR) 500 MG tablet Take 1 tablet (500 mg total) by mouth 3 (three) times daily for 7 days. 21 tablet 0  . lidocaine-prilocaine (EMLA) cream Apply to affected area once 30 g 3  . methylPREDNISolone (MEDROL DOSEPAK) 4 MG TBPK tablet Take 1 tablet (  4 mg total) by mouth taper from 4 doses each day to 1 dose and stop. 1 each 0  . ondansetron (ZOFRAN) 8 MG tablet Take 1 tablet (8 mg total) by mouth 2 (two) times daily as needed. Start on the third day after chemotherapy. 30 tablet 2  . prochlorperazine (COMPAZINE) 10 MG tablet Take 1 tablet (10 mg total) by mouth every 6 (six) hours as needed (Nausea or vomiting). 30 tablet 2  . sertraline (ZOLOFT) 50 MG tablet Take by mouth.    . traMADol (ULTRAM) 50 MG tablet Take 1 tablet (50 mg total) by mouth daily as needed for severe pain (headache). 30 tablet 0  . zolpidem (AMBIEN) 10 MG tablet Take 1 tablet (10 mg total) by mouth at bedtime as needed  for sleep. 30 tablet 1   No current facility-administered medications for this visit.   Facility-Administered Medications Ordered in Other Visits  Medication Dose Route Frequency Provider Last Rate Last Admin  . sodium chloride flush (NS) 0.9 % injection 10 mL  10 mL Intravenous PRN Alla Feeling, NP   10 mL at 03/28/20 1104  . sodium chloride flush (NS) 0.9 % injection 10 mL  10 mL Intracatheter PRN Truitt Merle, MD   10 mL at 04/19/20 1451    PHYSICAL EXAMINATION: ECOG PERFORMANCE STATUS: 1 - Symptomatic but completely ambulatory  Vitals:   04/19/20 1129  BP: 132/87  Pulse: 93  Resp: 18  Temp: 97.8 F (36.6 C)  SpO2: 100%   Filed Weights   04/19/20 1129  Weight: 185 lb 4.8 oz (84.1 kg)    GENERAL:alert, no distress and comfortable SKIN: skin color, texture, turgor are normal, no rashes or significant lesions EYES: normal, Conjunctiva are pink and non-injected, sclera clear  NECK: supple, thyroid normal size, non-tender, without nodularity LYMPH:  no palpable lymphadenopathy in the cervical, axillary  LUNGS: clear to auscultation and percussion with normal breathing effort HEART: regular rate & rhythm and no murmurs and no lower extremity edema ABDOMEN:abdomen soft, non-tender and normal bowel sounds Musculoskeletal:no cyanosis of digits and no clubbing  NEURO: alert & oriented x 3 with fluent speech, no focal motor/sensory deficits BREAST: (+) 1cm (previously 3x1-3x2cm) palpable left axillary LN (+) Central Left breast mild swelling with much improved pinkish skin of most left breast (+) Her 4x6cm mass in 11-12:00 position of left breast no longer palpable. Right Breast exam benign.   LABORATORY DATA:  I have reviewed the data as listed CBC Latest Ref Rng & Units 04/19/2020 04/05/2020 03/28/2020  WBC 4.0 - 10.5 K/uL 16.7(H) 9.0 6.3  Hemoglobin 12.0 - 15.0 g/dL 11.0(L) 11.3(L) 10.3(L)  Hematocrit 36 - 46 % 34.3(L) 35.3(L) 32.3(L)  Platelets 150 - 400 K/uL 239 279 209      CMP Latest Ref Rng & Units 04/19/2020 04/05/2020 03/28/2020  Glucose 70 - 99 mg/dL 70 79 80  BUN 6 - 20 mg/dL _0 Creatinine 0.44 - 1.00 mg/dL 0.76 0.77 0.66  Sodium 135 - 145 mmol/L 140 137 135  Potassium 3.5 - 5.1 mmol/L 3.7 4.1 3.9  Chloride 98 - 111 mmol/L 102 100 103  CO2 22 - 32 mmol/L 32 29 29  Calcium 8.9 - 10.3 mg/dL 9.9 9.6 8.8(L)  Total Protein 6.5 - 8.1 g/dL 7.5 7.4 6.1(L)  Total Bilirubin 0.3 - 1.2 mg/dL <0.2(L) <0.2(L) 0.7  Alkaline Phos 38 - 126 U/L 154(H) 161(H) 122  AST 15 - 41 U/L 19 17 9(L)  ALT 0 - 44  U/L _0 RADIOGRAPHIC STUDIES: I have personally reviewed the radiological images as listed and agreed with the findings in the report. No results found.   ASSESSMENT & PLAN:  Smt Lokey is a 39 y.o. female with    1.  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 Her ER/PR/HER2 markers were all negative. CT CAP/Bone scan negative for distant mets.  -To downstage disease and reduce risk of recurrence, I started her on neoadjuvant chemo with ddAC q2weeks for 4 cycles beginning 03/22/20-05/03/20. This will be followed by weekly carbo/taxol for 12 weeks starting 05/17/20 before proceeding with surgery.  -I discussed the recently publish [employer] 522 trial data, which showed 13% improvement of complete pathologic response , and 4-5%  Reduction of cancer recurrence from pembrolizumab in addition to neoadjuvant chemotherapy in stage II-III triple negative breast cancer. I recommend adding Keytruda q3weeks to complete 1 year treatment. I reviewed possible side  effects with her, especially autoimmune related pneumonitis, colitis, endocrine disorders. She voiced good understanding and is agreeable.  Consent obtained today.  -S/p C2 chemo her left breast mass is no longer palpable and her left axillary LN is much smaller, 1cm, on exam. She is tolerating treatment well with mild constipation and fatigue. I reviewed management with her.  -Labs reviewed, WBC 16.7, Hg 11, ANC 11.5, Alk Phos 154. Overall adequate to proceed with C3 AC today at same dose.  -F/u in 2 weeks   2. Constipation, Nausea  -Secondary to chemo -Nausea well controlled on compazine. She did not tolerate zofran due to headaches.  -I encouraged her to use miralax daily as needed.     3. Genetic Testing negative for pathogenetic mutations with VUS of POLE gene   4. Anxiety/Depression, Social Support  -She is currently on Xanax 6m TID and Celexa 562m This is managed by her PCP. -She is willing to f/u with SW for counseling as needed.  -She notes she has been stressed this past year and was binge drinking occasionally. She quit drinking 02/09/20 and does not plan to restart. She does not plan to restart smoking or vaping.  -She has very good social support from mother and boyfriend.  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 I offered seeing financial office and advocate if needed.  -I recommend she consult with financial advocate to apply for [place] [employer] -To help her sleep I called in low dose Ambien.   PLAN:  -I called in Ambien today  -Labs reviewed and adequate to proceed with C3 AC today at same dose  -Lab, flush, F/u and AC in 2 weeks  -Keytruda in 1, 4, 7 weeks, pending preauth  -Lab, flush, f/u and Carbo/taxol in 4, 5, 6, 7 weeks.     No problem-specific Assessment & Plan notes found for this encounter.   No orders of the defined types were placed in this encounter.  All questions were answered. The  patient knows to call the clinic with any problems, questions or concerns. No barriers to learning was detected.  The total time spent in the appointment was 40 minutes.     Truitt Merle, MD 04/19/2020   I, Joslyn Devon, am acting as scribe for Truitt Merle, MD.   I have reviewed the above documentation for accuracy and completeness, and I agree with the above.

## 2020-04-16 ENCOUNTER — Other Ambulatory Visit: Payer: Self-pay | Admitting: Nurse Practitioner

## 2020-04-17 ENCOUNTER — Inpatient Hospital Stay (HOSPITAL_BASED_OUTPATIENT_CLINIC_OR_DEPARTMENT_OTHER): Payer: 59 | Admitting: Nurse Practitioner

## 2020-04-17 ENCOUNTER — Inpatient Hospital Stay: Payer: 59

## 2020-04-17 ENCOUNTER — Other Ambulatory Visit: Payer: Self-pay

## 2020-04-17 ENCOUNTER — Telehealth: Payer: Self-pay

## 2020-04-17 VITALS — BP 116/80 | HR 110 | Temp 98.7°F | Resp 18 | Ht 64.0 in | Wt 187.0 lb

## 2020-04-17 DIAGNOSIS — Z95828 Presence of other vascular implants and grafts: Secondary | ICD-10-CM | POA: Diagnosis not present

## 2020-04-17 DIAGNOSIS — Z171 Estrogen receptor negative status [ER-]: Secondary | ICD-10-CM | POA: Diagnosis not present

## 2020-04-17 DIAGNOSIS — R21 Rash and other nonspecific skin eruption: Secondary | ICD-10-CM

## 2020-04-17 DIAGNOSIS — C50112 Malignant neoplasm of central portion of left female breast: Secondary | ICD-10-CM | POA: Diagnosis not present

## 2020-04-17 DIAGNOSIS — Z5111 Encounter for antineoplastic chemotherapy: Secondary | ICD-10-CM | POA: Diagnosis not present

## 2020-04-17 MED ORDER — FAMCICLOVIR 500 MG PO TABS: 500.0000 mg | ORAL_TABLET | Freq: Three times a day (TID) | ORAL | 0 refills | Status: AC

## 2020-04-17 MED ORDER — METHYLPREDNISOLONE 4 MG PO TBPK: 4.0000 mg | ORAL_TABLET | Freq: Four times a day (QID) | ORAL | 0 refills | Status: DC

## 2020-04-17 NOTE — Telephone Encounter (Signed)
Please see refill request.

## 2020-04-17 NOTE — Telephone Encounter (Signed)
Caitlyn Hendricks called stating she has a puritic rash that started around her neck like a necklace and now has moved her her face.  She has been applying lidocaine cream.  She is scheduled for a covid booster today at 1100.  Appt at 1000 in [facility] made.

## 2020-04-17 NOTE — Progress Notes (Signed)
Pt prefers to not receive Covid booster today d/t symptoms.  Pt agreed to receive booster same date/time next week.  Scheduling message sent.

## 2020-04-17 NOTE — Patient Instructions (Addendum)

## 2020-04-17 NOTE — Progress Notes (Signed)
[facility] Progress Note   Caitlyn Hendricks [id number] 17-Jun-1981 39 y.o.  Caitlyn Hendricks is managed by Dr. [name] Actively treated with chemotherapy/immunotherapy/hormonal therapy: yes  Current therapy: Neoadjuvant dose dense AC   Last treated: 04/05/2020 Cycle 2 dose dense AC  Next scheduled appointment with provider: 04/19/2020  Assessment: Plan:    Malignant neoplasm of central portion of left breast in female, estrogen receptor negative ([place])  Port-A-Cath in place  Rash and nonspecific skin eruption  Plan:  Medrol dose pack, titrate as directed.  Famvir 500mg , TID PO x 7 days Return to clinic for appt for OV and chemo on Wed 10/13/20201.   Please see After Visit Summary for patient specific instructions.  Future Appointments  Date Time Provider [place]  04/19/2020 10:15 AM [facility] None  04/19/2020 10:30 AM [facility] [place] FLUSH [facility] None  04/19/2020 11:00 AM Truitt Merle, MD [facility] None  04/19/2020 11:45 AM [facility] INFUSION [facility] None  04/21/2020 11:45 AM [facility] [place] [facility] None  04/24/2020 11:15 AM [facility] COVID CLINIC [facility] None  05/03/2020  8:15 AM [facility] None  05/03/2020  8:30 AM [facility] [place] None  05/03/2020  9:00 AM Truitt Merle, MD [facility] None  05/03/2020 10:00 AM [facility] INFUSION [facility] None    No orders of the defined types were placed in this encounter.      Subjective:   Patient ID:  Caitlyn Hendricks is a 39 y.o. (DOB 1980/12/21) female.  Chief Complaint:  Chief Complaint  Patient presents with  . Rash    HPI Caitlyn Hendricks  Presents today for work in visit with history of rash that began last week. She denies any new detergent, soap or food. She states she had noted one small area of redness prior to going out to eat [place] last Friday. Over the weekend the rash became worse despite taking Benadryl. She reports that it is  itchy, raised and annoying.  She denies history of herpes or shingles; reviewed that this could be an initial presentation. Discussed possible benefit of trial with medrol dose pack vs antiviral. She states she did have similar type rash on thighs a few years ago that took months to resolve. I think it is unlikely this is only an allergic reaction given the current constellation - that there is not a full body rash, the rash is more concentrated in the chest area, no other systemic response and she had no discernible difference with 50 mg of Benadryl. She denies any shortness of breath or cough or trouble breathing. No fever or chills. She does feel like her eyes are "itchy." We discussed options of antiviral alone, vs medrol dose pak alone and patient requesting we do both. Given that we are seeing her back in 2 days I am ok with this option.   Medications: I have reviewed the patient's current medications.  Allergies:  Allergies  Allergen Reactions  . Medroxyprogesterone Other (See Comments)    Heavy Bleeding Heavy Bleeding     Past Medical History:  Diagnosis Date  . Anxiety   . Depression     Past Surgical History:  Procedure Laterality Date  . PORTACATH PLACEMENT Right 03/21/2020   Procedure: INSERTION PORT-A-CATH WITH ULTRASOUND GUIDANCE;  Surgeon: [name], MD;  Location: [facility];  Service: General;  Laterality: Right;    Family History  Problem Relation Age of Onset  . Cancer Father        prostate cancer   .  Cancer Maternal Grandmother 43       breast cancer    Social History   Socioeconomic History  . Marital status: Single    Spouse name: Not on file  . Number of children: 0  . Years of education: Not on file  . Highest education level: Not on file  Occupational History  . Occupation: Air traffic controller   Tobacco Use  . Smoking status: Never Smoker  . Smokeless tobacco: Former Network engineer and Sexual Activity  . Alcohol use: Not on  file    Comment: bing drink for last year, usually liquor   . Drug use: Yes    Types: Marijuana  . Sexual activity: Yes  Other Topics Concern  . Not on file  Social History Narrative  . Not on file   Social Determinants of Health   Financial Resource Strain:   . Difficulty of Paying Living Expenses: Not on file  Food Insecurity:   . Worried About Charity fundraiser in the Last Year: Not on file  . Ran Out of Food in the Last Year: Not on file  Transportation Needs:   . Lack of Transportation (Medical): Not on file  . Lack of Transportation (Non-Medical): Not on file  Physical Activity:   . Days of Exercise per Week: Not on file  . Minutes of Exercise per Session: Not on file  Stress:   . Feeling of Stress : Not on file  Social Connections:   . Frequency of Communication with Friends and Family: Not on file  . Frequency of Social Gatherings with Friends and Family: Not on file  . Attends Religious Services: Not on file  . Active Member of Clubs or Organizations: Not on file  . Attends Archivist Meetings: Not on file  . Marital Status: Not on file  Intimate Partner Violence:   . Fear of Current or Ex-Partner: Not on file  . Emotionally Abused: Not on file  . Physically Abused: Not on file  . Sexually Abused: Not on file    Past Medical History, Surgical history, Social history, and Family history were reviewed and updated as appropriate.   Please see review of systems for further details on the patient's review from today.   Review of Systems:  Review of Systems  Objective:   Physical Exam:  BP 116/80 (BP Location: Left Arm, Patient Position: Sitting)   Pulse (!) 110   Temp 98.7 F (37.1 C)   Resp 18   Ht 5\' 4"  (1.626 m)   Wt 187 lb (84.8 kg)   SpO2 100%   BMI 32.10 kg/m  ECOG: 0  Physical Exam  Patient reports rash is also scattered in pelvic area, one small spot on right wrist also noted.      Lab Review:     Component Value Date/Time    NA 137 04/05/2020 1250   K 4.1 04/05/2020 1250   CL 100 04/05/2020 1250   CO2 29 04/05/2020 1250   GLUCOSE 79 04/05/2020 1250   BUN 13 04/05/2020 1250   CREATININE 0.77 04/05/2020 1250   CALCIUM 9.6 04/05/2020 1250   PROT 7.4 04/05/2020 1250   ALBUMIN 3.6 04/05/2020 1250   AST 17 04/05/2020 1250   ALT 24 04/05/2020 1250   ALKPHOS 161 (H) 04/05/2020 1250   BILITOT <0.2 (L) 04/05/2020 1250   GFRNONAA >60 04/05/2020 1250   GFRAA >60 04/05/2020 1250       Component Value Date/Time   WBC 9.0  04/05/2020 1250   RBC 4.23 04/05/2020 1250   HGB 11.3 (L) 04/05/2020 1250   HCT 35.3 (L) 04/05/2020 1250   PLT 279 04/05/2020 1250   MCV 83.5 04/05/2020 1250   MCH 26.7 04/05/2020 1250   MCHC 32.0 04/05/2020 1250   RDW 13.3 04/05/2020 1250   LYMPHSABS 1.3 04/05/2020 1250   MONOABS 0.8 04/05/2020 1250   EOSABS 0.0 04/05/2020 1250   BASOSABS 0.1 04/05/2020 1250   -------------------------------  Imaging from last 24 hours (if applicable):  Radiology interpretation: NM Bone Scan Whole Body  Result Date: 03/22/2020 CLINICAL DATA:  Breast carcinoma EXAM: NUCLEAR MEDICINE WHOLE BODY BONE SCAN TECHNIQUE: Whole body anterior and posterior images were obtained approximately 3 hours after intravenous injection of radiopharmaceutical. RADIOPHARMACEUTICALS:  21.4 mCi Technetium-23m MDP IV COMPARISON:  CT chest, abdomen, and pelvis March 17, 2020 FINDINGS: Increased radiotracer uptake is noted in the L5 region. Recent CT shows arthropathic change in this area. Other bony structures show normal radiotracer uptake. There are patchy areas of increased opacity in the pelvis at the site of a mass seen by CT in the pelvis. Note that this mass on CT does not show appreciable calcification. Flank kidneys noted bilaterally. 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 No bony metastatic disease is demonstrable on this study. Scattered foci of abnormal uptake in a pelvic mass is of uncertain etiology given absence of  calcification in this mass by CT. This mass compresses the urinary bladder. It is possible that some of the increased uptake in this area actually represents physiologic uptake within the bladder. Kidneys noted in flank positions bilaterally. Electronically Signed   By: Lowella Grip III M.D.   On: 03/22/2020 09:51   US Pelvis Complete  Result Date: 03/25/2020 CLINICAL DATA:  Evaluation of a pelvic mass seen on comparison CT EXAM: TRANSABDOMINAL ULTRASOUND OF PELVIS TECHNIQUE: Transabdominal ultrasound examination of the pelvis was performed including evaluation of the uterus, ovaries, adnexal regions, and pelvic cul-de-sac. COMPARISON:  CT abdomen pelvis 03/17/2020 FINDINGS: Uterus Measurements: 11.1 x 3.4 x 4.6 cm = volume: 90.1 mL. There is a large pedunculated lesion which appears directly contiguous with the uterine fundus demonstrating a contiguous vascular pedicle most suggestive of a large subserosal fibroid which measures up to 7.6 x 7.2 x 8.2 cm. An additional hypoechoic 1 x 0.8 x 0.8 cm probable intramural fibroid in the right anterior aspect of the uterine body is seen as well. Endometrium Thickness: 3.8 mm, non thickened.  No focal abnormality visualized. Right ovary Measurements: 2.9 x 1.6 x 2.7 cm = volume: 6.5 mL. Normal appearance/no adnexal mass. Left ovary Measurements: 3.8 x 1.9 x 1.6 cm = volume: 5.8 mL. Normal appearance/no adnexal mass. Other findings:  No abnormal free fluid. IMPRESSION: 1. Large pedunculated lesion directly contiguous with the uterine most suggestive of a large subserosal fibroid which measures up to 8.2 cm. 2. Additional 1 cm probable intramural fibroid in the right anterior uterine body. 3. No other acute or worrisome pelvic abnormality. Electronically Signed   By: Lovena Le M.D.   On: 03/25/2020 00:29   DG Fluoro Guide CV Line Right  Result Date: 03/22/2020 CLINICAL DATA:  Port-A-Cath placement in 39 year old female performed in the operating room.  INDICATION: Port-A-Cath placement in 39 year old female performed in the operating room. EXAM: CENTRAL VENOUS CATHETER WITH FLUOROSCOPY COMPARISON:  None. FLUOROSCOPY TIME:  21.4 seconds FINDINGS: Single fluoroscopic spot image from placement of right chest wall Port-A-Cath with catheter appearing to enter the internal jugular vein. The tip  terminates near the cavoatrial junction. No evidence of pneumothorax. A Ray-Tec projects over the right neck. IMPRESSION: Intraoperative single fluoroscopic image demonstrates a right internal jugular Port-A-Cath with the catheter tip near the cavoatrial junction. Electronically Signed   By: Ruthann Cancer MD   On: 03/22/2020 08:47   ECHOCARDIOGRAM COMPLETE  Result Date: 03/20/2020    ECHOCARDIOGRAM REPORT   Patient Name:   Caitlyn Hendricks Date of Exam: 03/20/2020 Medical Rec #:  [id number]  Height:       64.0 in Accession #:    [id number] Weight:       180.0 lb Date of Birth:  [birth year]  BSA:          1.871 m Patient Age:    39 years   BP:           107/75 mmHg Patient Gender: F          HR:           73 bpm. Exam Location:  Outpatient Procedure: 2D Echo, Cardiac Doppler, Color Doppler, 3D Echo and Strain Analysis Indications:    Z51.11 Encounter for antineoplastic chemotheraphy  History:        Patient has no prior history of Echocardiogram examinations.  Sonographer:    Tiffany Dance Referring Phys: [id number] [place]  1. Left ventricular ejection fraction, by estimation, is 60 to 65%. The left ventricle has normal function. The left ventricle has no regional wall motion abnormalities. Left ventricular diastolic parameters were normal. The average left ventricular global longitudinal strain is -19.5 %. The global longitudinal strain is normal.  2. Right ventricular systolic function is normal. The right ventricular size is normal. Tricuspid regurgitation signal is inadequate for assessing PA pressure.  3. A small pericardial effusion is present. The pericardial effusion  is anterior to the right ventricle.  4. The mitral valve is normal in structure. No evidence of mitral valve regurgitation. No evidence of mitral stenosis.  5. The aortic valve is normal in structure. Aortic valve regurgitation is trivial. No aortic stenosis is present.  6. The inferior vena cava is normal in size with greater than 50% respiratory variability, suggesting right atrial pressure of 3 mmHg. FINDINGS  Left Ventricle: Left ventricular ejection fraction, by estimation, is 60 to 65%. The left ventricle has normal function. The left ventricle has no regional wall motion abnormalities. The average left ventricular global longitudinal strain is -19.5 %. The global longitudinal strain is normal. The left ventricular internal cavity size was normal in size. There is no left ventricular hypertrophy. Left ventricular diastolic parameters were normal. Normal left ventricular filling pressure. Right Ventricle: The right ventricular size is normal. No increase in right ventricular wall thickness. Right ventricular systolic function is normal. Tricuspid regurgitation signal is inadequate for assessing PA pressure. Left Atrium: Left atrial size was normal in size. Right Atrium: Right atrial size was normal in size. Pericardium: A small pericardial effusion is present. The pericardial effusion is anterior to the right ventricle. Mitral Valve: The mitral valve is normal in structure. There is mild thickening of the anterior mitral valve leaflet(s). No evidence of mitral valve regurgitation. No evidence of mitral valve stenosis. Tricuspid Valve: The tricuspid valve is normal in structure. Tricuspid valve regurgitation is trivial. No evidence of tricuspid stenosis. Aortic Valve: The aortic valve is normal in structure. Aortic valve regurgitation is trivial. No aortic stenosis is present. Pulmonic Valve: The pulmonic valve was normal in structure. Pulmonic valve regurgitation is trivial. No evidence of pulmonic stenosis.  Aorta: The aortic root is normal in size and structure. Venous: The inferior vena cava is normal in size with greater than 50% respiratory variability, suggesting right atrial pressure of 3 mmHg. IAS/Shunts: No atrial level shunt detected by color flow Doppler.  LEFT VENTRICLE PLAX 2D LVIDd:         3.60 cm  Diastology LVIDs:         2.70 cm  LV e' medial:    9.46 cm/s LV PW:         0.90 cm  LV E/e' medial:  8.3 LV IVS:        0.90 cm  LV e' lateral:   11.90 cm/s LVOT diam:     1.70 cm  LV E/e' lateral: 6.6 LV SV:         57 LV SV Index:   31       2D Longitudinal Strain LVOT Area:     2.27 cm 2D Strain GLS (A2C):   -20.4 %                         2D Strain GLS (A3C):   -18.6 %                         2D Strain GLS (A4C):   -19.4 %                         2D Strain GLS Avg:     -19.5 %                          3D Volume EF:                         3D EF:        64 %                         LV EDV:       131 ml                         LV ESV:       47 ml                         LV SV:        83 ml RIGHT VENTRICLE             IVC RV Basal diam:  2.60 cm     IVC diam: 1.20 cm RV S prime:     11.70 cm/s TAPSE (M-mode): 2.2 cm LEFT ATRIUM           Index       RIGHT ATRIUM           Index LA diam:      3.40 cm 1.82 cm/m  RA Area:     13.30 cm LA Vol (A2C): 50.9 ml 27.21 ml/m RA Volume:   30.60 ml  16.36 ml/m LA Vol (A4C): 13.4 ml 7.16 ml/m  AORTIC VALVE LVOT Vmax:   133.00 cm/s LVOT Vmean:  88.000 cm/s LVOT VTI:    0.253 m  AORTA Ao Root diam: 2.80 cm Ao Asc diam:  3.20 cm MITRAL VALVE MV Area (PHT): 4.06 cm    SHUNTS MV Decel  Time: 187 msec    Systemic VTI:  0.25 m MV E velocity: 78.10 cm/s  Systemic Diam: 1.70 cm MV A velocity: 57.20 cm/s MV E/A ratio:  1.37 Fransico Him MD Electronically signed by Fransico Him MD Signature Date/Time: 03/20/2020/11:16:28 AM    Final

## 2020-04-18 NOTE — Telephone Encounter (Signed)
I asked Dr [name] about the tramadol and she said she would discus it with her tomorrow.

## 2020-04-18 NOTE — Telephone Encounter (Signed)
Please advise 

## 2020-04-19 ENCOUNTER — Other Ambulatory Visit: Payer: Self-pay | Admitting: Hematology

## 2020-04-19 ENCOUNTER — Inpatient Hospital Stay (HOSPITAL_BASED_OUTPATIENT_CLINIC_OR_DEPARTMENT_OTHER): Payer: 59 | Admitting: Hematology

## 2020-04-19 ENCOUNTER — Inpatient Hospital Stay: Payer: 59

## 2020-04-19 ENCOUNTER — Encounter: Payer: Self-pay | Admitting: Hematology

## 2020-04-19 ENCOUNTER — Other Ambulatory Visit: Payer: Self-pay

## 2020-04-19 VITALS — BP 132/87 | HR 93 | Temp 97.8°F | Resp 18 | Ht 64.0 in | Wt 185.3 lb

## 2020-04-19 DIAGNOSIS — C50112 Malignant neoplasm of central portion of left female breast: Secondary | ICD-10-CM

## 2020-04-19 DIAGNOSIS — Z171 Estrogen receptor negative status [ER-]: Secondary | ICD-10-CM

## 2020-04-19 DIAGNOSIS — Z5111 Encounter for antineoplastic chemotherapy: Secondary | ICD-10-CM | POA: Diagnosis not present

## 2020-04-19 DIAGNOSIS — Z95828 Presence of other vascular implants and grafts: Secondary | ICD-10-CM

## 2020-04-19 LAB — CBC WITH DIFFERENTIAL (CANCER CENTER ONLY)
Abs Immature Granulocytes: 2.18 10*3/uL — ABNORMAL HIGH (ref 0.00–0.07)
Basophils Absolute: 0.1 10*3/uL (ref 0.0–0.1)
Basophils Relative: 1 %
Eosinophils Absolute: 0 10*3/uL (ref 0.0–0.5)
Eosinophils Relative: 0 %
HCT: 34.3 % — ABNORMAL LOW (ref 36.0–46.0)
Hemoglobin: 11 g/dL — ABNORMAL LOW (ref 12.0–15.0)
Immature Granulocytes: 13 %
Lymphocytes Relative: 9 %
Lymphs Abs: 1.6 10*3/uL (ref 0.7–4.0)
MCH: 27.4 pg (ref 26.0–34.0)
MCHC: 32.1 g/dL (ref 30.0–36.0)
MCV: 85.3 fL (ref 80.0–100.0)
Monocytes Absolute: 1.3 10*3/uL — ABNORMAL HIGH (ref 0.1–1.0)
Monocytes Relative: 8 %
Neutro Abs: 11.5 10*3/uL — ABNORMAL HIGH (ref 1.7–7.7)
Neutrophils Relative %: 69 %
Platelet Count: 239 10*3/uL (ref 150–400)
RBC: 4.02 MIL/uL (ref 3.87–5.11)
RDW: 14.7 % (ref 11.5–15.5)
WBC Count: 16.7 10*3/uL — ABNORMAL HIGH (ref 4.0–10.5)
nRBC: 0.2 % (ref 0.0–0.2)

## 2020-04-19 LAB — CMP (CANCER CENTER ONLY)
ALT: 26 U/L (ref 0–44)
AST: 19 U/L (ref 15–41)
Albumin: 3.8 g/dL (ref 3.5–5.0)
Alkaline Phosphatase: 154 U/L — ABNORMAL HIGH (ref 38–126)
Anion gap: 6 (ref 5–15)
BUN: 12 mg/dL (ref 6–20)
CO2: 32 mmol/L (ref 22–32)
Calcium: 9.9 mg/dL (ref 8.9–10.3)
Chloride: 102 mmol/L (ref 98–111)
Creatinine: 0.76 mg/dL (ref 0.44–1.00)
GFR, Estimated: >60 mL/min (ref >60)
Glucose, Bld: 70 mg/dL (ref 70–99)
Potassium: 3.7 mmol/L (ref 3.5–5.1)
Sodium: 140 mmol/L (ref 135–145)
Total Bilirubin: <0.2 mg/dL — ABNORMAL LOW (ref 0.3–1.2)
Total Protein: 7.5 g/dL (ref 6.5–8.1)

## 2020-04-19 MED ORDER — DOXORUBICIN HCL CHEMO IV INJECTION 2 MG/ML
60.0000 mg/m2 | Freq: Once | INTRAVENOUS | Status: AC
  Administered 2020-04-19: 116 mg via INTRAVENOUS
  Filled 2020-04-19: qty 58

## 2020-04-19 MED ORDER — SODIUM CHLORIDE 0.9 % IV SOLN
Freq: Once | INTRAVENOUS | Status: AC
  Filled 2020-04-19: qty 250

## 2020-04-19 MED ORDER — PALONOSETRON HCL INJECTION 0.25 MG/5ML
INTRAVENOUS | Status: AC
  Filled 2020-04-19: qty 5

## 2020-04-19 MED ORDER — ZOLPIDEM TARTRATE 10 MG PO TABS: 10.0000 mg | ORAL_TABLET | Freq: Every evening | ORAL | 1 refills | Status: DC | PRN

## 2020-04-19 MED ORDER — HEPARIN SOD (PORK) LOCK FLUSH 100 UNIT/ML IV SOLN
500.0000 [IU] | Freq: Once | INTRAVENOUS | Status: AC | PRN
  Administered 2020-04-19: 500 [IU]
  Filled 2020-04-19: qty 5

## 2020-04-19 MED ORDER — SODIUM CHLORIDE 0.9% FLUSH
10.0000 mL | INTRAVENOUS | Status: DC | PRN
  Administered 2020-04-19: 10 mL
  Filled 2020-04-19: qty 10

## 2020-04-19 MED ORDER — PALONOSETRON HCL INJECTION 0.25 MG/5ML
0.2500 mg | Freq: Once | INTRAVENOUS | Status: AC
  Administered 2020-04-19: 0.25 mg via INTRAVENOUS

## 2020-04-19 MED ORDER — SODIUM CHLORIDE 0.9 % IV SOLN
150.0000 mg | Freq: Once | INTRAVENOUS | Status: AC
  Administered 2020-04-19: 150 mg via INTRAVENOUS
  Filled 2020-04-19: qty 150

## 2020-04-19 MED ORDER — SODIUM CHLORIDE 0.9 % IV SOLN
10.0000 mg | Freq: Once | INTRAVENOUS | Status: AC
  Administered 2020-04-19: 10 mg via INTRAVENOUS
  Filled 2020-04-19: qty 10

## 2020-04-19 MED ORDER — SODIUM CHLORIDE 0.9 % IV SOLN
600.0000 mg/m2 | Freq: Once | INTRAVENOUS | Status: AC
  Administered 2020-04-19: 1160 mg via INTRAVENOUS
  Filled 2020-04-19: qty 58

## 2020-04-19 NOTE — Patient Instructions (Signed)

## 2020-04-19 NOTE — Patient Instructions (Signed)
[facility] Discharge Instructions for Patients Receiving Chemotherapy  Today you received the following chemotherapy agents Adriamycin; Cytoxan  To help prevent nausea and vomiting after your treatment, we encourage you to take your nausea medication as directed   If you develop nausea and vomiting that is not controlled by your nausea medication, call the clinic.   BELOW ARE SYMPTOMS THAT SHOULD BE REPORTED IMMEDIATELY:  *FEVER GREATER THAN 100.5 F  *CHILLS WITH OR WITHOUT FEVER  NAUSEA AND VOMITING THAT IS NOT CONTROLLED WITH YOUR NAUSEA MEDICATION  *UNUSUAL SHORTNESS OF BREATH  *UNUSUAL BRUISING OR BLEEDING  TENDERNESS IN MOUTH AND THROAT WITH OR WITHOUT PRESENCE OF ULCERS  *URINARY PROBLEMS  *BOWEL PROBLEMS  UNUSUAL RASH Items with * indicate a potential emergency and should be followed up as soon as possible.  Feel free to call the clinic should you have any questions or concerns. The clinic phone number is [phone].  Please show the CHEMO ALERT CARD at check-in to the Emergency Department and triage nurse.   

## 2020-04-20 ENCOUNTER — Other Ambulatory Visit: Payer: Self-pay | Admitting: Hematology

## 2020-04-20 MED ORDER — ZOLPIDEM TARTRATE 10 MG PO TABS: 10.0000 mg | ORAL_TABLET | Freq: Every evening | ORAL | 1 refills | Status: DC | PRN

## 2020-04-21 ENCOUNTER — Telehealth: Payer: Self-pay | Admitting: Hematology

## 2020-04-21 ENCOUNTER — Other Ambulatory Visit: Payer: Self-pay

## 2020-04-21 ENCOUNTER — Inpatient Hospital Stay: Payer: 59

## 2020-04-21 VITALS — BP 122/89 | HR 104 | Temp 98.2°F | Resp 18

## 2020-04-21 DIAGNOSIS — Z171 Estrogen receptor negative status [ER-]: Secondary | ICD-10-CM

## 2020-04-21 DIAGNOSIS — Z5111 Encounter for antineoplastic chemotherapy: Secondary | ICD-10-CM | POA: Diagnosis not present

## 2020-04-21 MED ORDER — PEGFILGRASTIM-JMDB 6 MG/0.6ML ~~LOC~~ SOSY
PREFILLED_SYRINGE | SUBCUTANEOUS | Status: AC
  Filled 2020-04-21: qty 0.6

## 2020-04-21 MED ORDER — PEGFILGRASTIM-JMDB 6 MG/0.6ML ~~LOC~~ SOSY
6.0000 mg | PREFILLED_SYRINGE | Freq: Once | SUBCUTANEOUS | Status: AC
  Administered 2020-04-21: 6 mg via SUBCUTANEOUS

## 2020-04-21 NOTE — Patient Instructions (Signed)

## 2020-04-21 NOTE — Telephone Encounter (Signed)
Scheduled per 10/13 los. Pt is aware of appt times and dates.

## 2020-04-24 ENCOUNTER — Inpatient Hospital Stay: Payer: 59

## 2020-04-24 ENCOUNTER — Other Ambulatory Visit: Payer: Self-pay

## 2020-04-24 DIAGNOSIS — Z5111 Encounter for antineoplastic chemotherapy: Secondary | ICD-10-CM | POA: Diagnosis not present

## 2020-04-24 DIAGNOSIS — Z23 Encounter for immunization: Secondary | ICD-10-CM

## 2020-04-24 NOTE — Progress Notes (Signed)
Pt stayed for 15 min post covid shot, tolerated well.  Denies any questions/concerns at time of d/c.

## 2020-04-24 NOTE — Progress Notes (Signed)
Pharmacist Chemotherapy Monitoring - Initial Assessment    Anticipated start date: 04/28/20   Regimen:  . Are orders appropriate based on the patient's diagnosis, regimen, and cycle? Yes . Does the plan date match the patient's scheduled date? Yes . Is the sequencing of drugs appropriate? Yes . Are the premedications appropriate for the patient's regimen? Yes . Prior Authorization for treatment is: Pending o If applicable, is the correct biosimilar selected based on the patient's insurance? not applicable  Organ Function and Labs: Marland Kitchen Are dose adjustments needed based on the patient's renal function, hepatic function, or hematologic function? No . Are appropriate labs ordered prior to the start of patient's treatment? Yes . Other organ system assessment, if indicated: N/A . The following baseline labs, if indicated, have been ordered: pembrolizumab: baseline TSH +/- T4  Dose Assessment: . Are the drug doses appropriate? Yes . Are the following correct: o Drug concentrations Yes o IV fluid compatible with drug Yes o Administration routes Yes o Timing of therapy Yes . If applicable, does the patient have documented access for treatment and/or plans for port-a-cath placement? yes . If applicable, have lifetime cumulative doses been properly documented and assessed? yes Lifetime Dose Tracking  . Doxorubicin: 180.324 mg/m2 (348 mg) = 40.07 % of the maximum lifetime dose of 450 mg/m2  o   Toxicity Monitoring/Prevention: . The patient has the following take home antiemetics prescribed: N/A . The patient has the following take home medications prescribed: N/A . Medication allergies and previous infusion related reactions, if applicable, have been reviewed and addressed. Yes . The patient's current medication list has been assessed for drug-drug interactions with their chemotherapy regimen. no significant drug-drug interactions were identified on review.  Order Review: . Are the treatment  plan orders signed? Yes . Is the patient scheduled to see a provider prior to their treatment? No  I verify that I have reviewed each item in the above checklist and answered each question accordingly.   Kennith Center, Pharm.D., CPP 04/24/2020@4 :42 PM

## 2020-04-26 ENCOUNTER — Encounter: Payer: Self-pay | Admitting: Hematology

## 2020-04-26 ENCOUNTER — Other Ambulatory Visit: Payer: Self-pay | Admitting: Medical

## 2020-04-26 ENCOUNTER — Telehealth: Payer: Self-pay | Admitting: Emergency Medicine

## 2020-04-26 ENCOUNTER — Other Ambulatory Visit: Payer: Self-pay | Admitting: Nurse Practitioner

## 2020-04-26 ENCOUNTER — Other Ambulatory Visit: Payer: Self-pay | Admitting: Hematology

## 2020-04-26 MED ORDER — TRAMADOL HCL 50 MG PO TABS: 50.0000 mg | ORAL_TABLET | Freq: Every day | ORAL | 0 refills | Status: DC | PRN

## 2020-04-26 MED ORDER — FLUCONAZOLE 150 MG PO TABS: ORAL_TABLET | ORAL | 0 refills | Status: DC

## 2020-04-26 MED ORDER — AMOXICILLIN-POT CLAVULANATE 875-125 MG PO TABS: 1.0000 | ORAL_TABLET | Freq: Two times a day (BID) | ORAL | 0 refills | Status: AC

## 2020-04-26 NOTE — Progress Notes (Signed)
PtwasapprovedforFulphila from9/24/21to9/23/22for up to $10,000.The program reduces pt's copay responsibility to $0.

## 2020-04-26 NOTE — Telephone Encounter (Signed)
Received call from pt reporting white sores on tonsils and sore throat.  States that this happened before during her treatment and was resolved with oral antibiotics.  PA Lucianne Lei to call in abx to pt's preferred pharmacy, pt aware to call back as needed with any further questions/concerns.

## 2020-04-28 ENCOUNTER — Other Ambulatory Visit: Payer: Self-pay

## 2020-04-28 ENCOUNTER — Inpatient Hospital Stay: Payer: 59

## 2020-04-28 VITALS — BP 132/90 | HR 112 | Temp 98.0°F | Resp 18 | Wt 194.5 lb

## 2020-04-28 DIAGNOSIS — C50112 Malignant neoplasm of central portion of left female breast: Secondary | ICD-10-CM

## 2020-04-28 DIAGNOSIS — Z5111 Encounter for antineoplastic chemotherapy: Secondary | ICD-10-CM | POA: Diagnosis not present

## 2020-04-28 DIAGNOSIS — Z171 Estrogen receptor negative status [ER-]: Secondary | ICD-10-CM

## 2020-04-28 MED ORDER — HEPARIN SOD (PORK) LOCK FLUSH 100 UNIT/ML IV SOLN
500.0000 [IU] | Freq: Once | INTRAVENOUS | Status: AC | PRN
  Administered 2020-04-28: 500 [IU]
  Filled 2020-04-28: qty 5

## 2020-04-28 MED ORDER — SODIUM CHLORIDE 0.9 % IV SOLN
Freq: Once | INTRAVENOUS | Status: AC
  Filled 2020-04-28: qty 250

## 2020-04-28 MED ORDER — SODIUM CHLORIDE 0.9 % IV SOLN
200.0000 mg | Freq: Once | INTRAVENOUS | Status: AC
  Administered 2020-04-28: 200 mg via INTRAVENOUS
  Filled 2020-04-28: qty 8

## 2020-04-28 MED ORDER — SODIUM CHLORIDE 0.9% FLUSH
10.0000 mL | INTRAVENOUS | Status: DC | PRN
  Administered 2020-04-28: 10 mL
  Filled 2020-04-28: qty 10

## 2020-04-28 NOTE — Progress Notes (Signed)
[facility]   [phone] [phone]   Clinic Follow up Note   Patient Care Team: [name], MD as PCP - General (Family Medicine) Mauro Kaufmann, RN as Oncology Nurse Navigator Rockwell Germany, RN as Oncology Nurse Navigator  Date of Service:  05/03/2020  CHIEF COMPLAINT:  F/u of left breast cancer   SUMMARY OF ONCOLOGIC HISTORY: Oncology History Overview Note  Cancer Staging Cancer of central portion of left female breast [name]) Staging form: Breast, AJCC 8th Edition - Clinical: No stage assigned - Unsigned    Cancer of central portion of left female breast ([place])  02/23/2020 Mammogram   Diagnostic Mammogram 02/23/20  IMPRESSION The 2x1x2.6cm irregular mass in teh left breast at 12:00 posiiton middle depth is highly suspicious of malignancy. An [place] is recommended for further evaluation and biopsy planning purposes.   02/23/2020 Breast US   [name] 02/23/20  IMPRESSION 2 adjacent spiculated masses in the left brast at 12:00 position 3 cm from the nipple (2.1x0.9x1.1cm and 1.1x1.4x0.5cm) is suggestive of malignancy.   Multiple abnormal left subpectoral and left axillary nodes measuring 3.3x2.1 cm concerning for metastatic adenopathy.    Left breast skin thickening and edema may be secondary congestive edema due to extensive axillary adenopathy.    03/08/2020 Initial Biopsy   Diagnosis 1.Breast, left, needle core biopsy, 12:00 position, 3cmfn -INVASIVE DUCTAL CARCINOMA -SEE COMMENT  2. Lymph node, needle/core biopsy, left axilla -METASTATIC CARCINOMA INVOLVING A LYMPH NODE  -LYMPHOVASCULAR SPACE INVASION PRESENT   Microscopic Comment  1.Based on the biopsy the carcinoma appears Nottingham Grade 3 or 3 and measures 1 cm in the greatest linear extent.    03/08/2020 Receptors her2   ER- Negative 0% PR - Negative 0% HER2 - Negative  KI 67 - 80%    03/08/2020 Cancer Staging   Staging form: Breast, AJCC 8th Edition -  Clinical stage from 03/08/2020: Stage IIIB (cT2, cN1, cM0, G3, ER-, PR-, HER2-) - Signed by Truitt Merle, MD on 03/10/2020   03/10/2020 Initial Diagnosis   Cancer of central portion of left female breast ([name])   03/16/2020 Breast MRI   IMPRESSION: 1. 8.1 x 7.8 x 6.6 cm biopsy proven invasive ductal carcinoma in the central right breast, involving 3 quadrants. 2. 3.0 x 1.7 x 1.1 cm satellite mass more inferiorly in lower inner quadrant of the left breast, compatible with additional malignancy. 3. Metastatic level 1 and level 2 left axillary lymph nodes. 4. No evidence of malignancy on the right.   03/17/2020 Imaging   IMPRESSION: CT CAP w contrast  1. Diffuse skin thickening in the left breast with left axillary and subpectoral lymphadenopathy, as well as a mildly enlarged left supraclavicular lymph node, which likely represents metastatic lymphadenopathy. No other definite extra nodal metastatic disease noted elsewhere in the chest, abdomen or pelvis. 2. Large mass in the central anatomic pelvis which is of uncertain origin, potentially a large exophytic fibroid or a large solid mass arising from the right ovary. Further evaluation with pelvic ultrasound is strongly recommended.   03/21/2020 Imaging   Bone Scan  IMPRESSION: Apparent arthropathy at L5. No bony metastatic disease is demonstrable on this study. Scattered foci of abnormal uptake in a pelvic mass is of uncertain etiology given absence of calcification in this mass by CT. This mass compresses the urinary bladder. It is possible that some of the increased uptake in this area actually represents physiologic uptake within the bladder.   Kidneys noted in  flank positions bilaterally.     03/22/2020 -  Neo-Adjuvant Chemotherapy   Neoadjuvant Adriamycin and Cytoxan q2weeks for 4 cycles starting 03/22/20-05/03/20 followed by weekly Taxol and Carboplatin for 12 weeks starting 05/17/20   03/24/2020 Imaging   US Pelvis  IMPRESSION: 1.  Large pedunculated lesion directly contiguous with the uterine most suggestive of a large subserosal fibroid which measures up to 8.2 cm. 2. Additional 1 cm probable intramural fibroid in the right anterior uterine body. 3. No other acute or worrisome pelvic abnormality.   04/28/2020 -  Antibody Plan   Added Keytruda q3weeks starting 04/28/20 to complete 1 year of treatment       CURRENT THERAPY:  Neoadjuvant chemo ddAC q2weeks for 4 cycles starting 03/22/20-05/03/20, followed by weekly carbo/taxol for 12 weeks starting 05/17/20.             -Added Keytruda q3weeks to complete 1 year of treatment on 04/28/20   INTERVAL HISTORY:  Caitlyn Hendricks is here for a follow up. She presents to the clinic with her mother. She notes her skin rash of her chest and upper neck and shoulders has muich improved in the last few days. Before she her rash blistered and opened. She notes her eye were swollen as well. She notes she did have mild bump of her abdomen after first cycle AC and has been slowly increasing. She used 2 days of prednisone which improved her skin rash the most. She notes she tolerated Keytruda last week. She notes intermittent constipation and feels she has possible hemorrhoids due to pain and mild blood when wiping. She notes she has increased miralax.  She notes still feeling her axillary LN but not checking her breast.     REVIEW OF SYSTEMS:   Constitutional: Denies fevers, chills or abnormal weight loss Eyes: Denies blurriness of vision Ears, nose, mouth, throat, and face: Denies mucositis or sore throat Respiratory: Denies cough, dyspnea or wheezes Cardiovascular: Denies palpitation, chest discomfort or lower extremity swelling Gastrointestinal:  Denies nausea, heartburn (+) Constipation, blood with wiping Skin: (+) Skin rash of upper body improved  Lymphatics: Denies new lymphadenopathy or easy bruising Neurological:Denies numbness, tingling or new weaknesses Behavioral/Psych:  Mood is stable, no new changes  All other systems were reviewed with the patient and are negative.  MEDICAL HISTORY:  Past Medical History:  Diagnosis Date  . Anxiety   . Depression     SURGICAL HISTORY: Past Surgical History:  Procedure Laterality Date  . PORTACATH PLACEMENT Right 03/21/2020   Procedure: INSERTION PORT-A-CATH WITH ULTRASOUND GUIDANCE;  Surgeon: [name], MD;  Location: [facility];  Service: General;  Laterality: Right;    I have reviewed the social history and family history with the patient and they are unchanged from previous note.  ALLERGIES:  is allergic to medroxyprogesterone.  MEDICATIONS:  Current Outpatient Medications  Medication Sig Dispense Refill  . ALPRAZolam (XANAX) 0.5 MG tablet Take 0.5 mg by mouth 3 (three) times daily.    Marland Kitchen ALPRAZolam (XANAX) 1 MG tablet Take 1 mg by mouth 3 (three) times daily as needed for anxiety.     Marland Kitchen amoxicillin-clavulanate (AUGMENTIN) 875-125 MG tablet Take 1 tablet by mouth 2 (two) times daily for 7 days. Take a probiotic once daily or eat yogurt with this medication. 14 tablet 0  . citalopram (CELEXA) 20 MG tablet Take 20 mg by mouth daily.    Marland Kitchen dexamethasone (DECADRON) 4 MG tablet Take 2 tablets by mouth daily starting the day after Carboplatin  and Cytoxan x 3 days. Take with food. 30 tablet 1  . fluconazole (DIFLUCAN) 150 MG tablet Take 1 tablet for a yeast infection if needed, may repeat in 3 days if needed Use if needed while on antibiotic. 2 tablet 0  . lidocaine-prilocaine (EMLA) cream Apply to affected area once 30 g 3  . methylPREDNISolone (MEDROL DOSEPAK) 4 MG TBPK tablet Take 1 tablet (4 mg total) by mouth taper from 4 doses each day to 1 dose and stop. 1 each 0  . omeprazole (PRILOSEC) 20 MG capsule Take 1 capsule (20 mg total) by mouth 2 (two) times daily before a meal. 60 capsule 2  . ondansetron (ZOFRAN) 8 MG tablet Take 1 tablet (8 mg total) by mouth 2 (two) times daily as needed.  Start on the third day after chemotherapy. 30 tablet 2  . prochlorperazine (COMPAZINE) 10 MG tablet Take 1 tablet (10 mg total) by mouth every 6 (six) hours as needed (Nausea or vomiting). 30 tablet 2  . sertraline (ZOLOFT) 50 MG tablet Take by mouth.    . traMADol (ULTRAM) 50 MG tablet Take 1 tablet (50 mg total) by mouth daily as needed for severe pain (headache). 30 tablet 0  . triamcinolone ointment (KENALOG) 0.5 % Apply 1 application topically 2 (two) times daily. 30 g 2  . zolpidem (AMBIEN) 10 MG tablet Take 1 tablet (10 mg total) by mouth at bedtime as needed for sleep. 30 tablet 1   No current facility-administered medications for this visit.   Facility-Administered Medications Ordered in Other Visits  Medication Dose Route Frequency Provider Last Rate Last Admin  . sodium chloride flush (NS) 0.9 % injection 10 mL  10 mL Intravenous PRN Alla Feeling, NP   10 mL at 03/28/20 1104    PHYSICAL EXAMINATION: ECOG PERFORMANCE STATUS: 1 - Symptomatic but completely ambulatory  Vitals:   05/03/20 0920  BP: 124/86  Pulse: 95  Resp: 18  Temp: 97.8 F (36.6 C)  SpO2: 100%   Filed Weights   05/03/20 0920  Weight: 195 lb 14.4 oz (88.9 kg)    GENERAL:alert, no distress and comfortable SKIN: skin color, texture, turgor are normal (+) Improved skin rash of chest, neck, face EYES: normal, Conjunctiva are pink and non-injected, sclera clear  NECK: supple, thyroid normal size, non-tender, without nodularity LYMPH:  no palpable lymphadenopathy in the cervical, axillary  LUNGS: clear to auscultation and percussion with normal breathing effort HEART: regular rate & rhythm and no murmurs and no lower extremity edema ABDOMEN:abdomen soft, non-tender and normal bowel sounds Musculoskeletal:no cyanosis of digits and no clubbing  NEURO: alert & oriented x 3 with fluent speech, no focal motor/sensory deficits BREAST:(+) Barely palpable 1cm (previously 3x1-3x2cm) left axillary LN (+) Central  Left breast with much improved swelling and pinkish skin of most left breast (+) Her 4x6cm mass in 11-12:00 position of left breast no longer palpable. RightBreast exam benign.   LABORATORY DATA:  I have reviewed the data as listed CBC Latest Ref Rng & Units 05/03/2020 04/19/2020 04/05/2020  WBC 4.0 - 10.5 K/uL 11.4(H) 16.7(H) 9.0  Hemoglobin 12.0 - 15.0 g/dL 10.3(L) 11.0(L) 11.3(L)  Hematocrit 36 - 46 % 31.9(L) 34.3(L) 35.3(L)  Platelets 150 - 400 K/uL 221 239 279     CMP Latest Ref Rng & Units 05/03/2020 04/19/2020 04/05/2020  Glucose 70 - 99 mg/dL 80 70 79  BUN 6 - 20 mg/dL 16 12 13   Creatinine 0.44 - 1.00 mg/dL 0.70 0.76 0.77  Sodium  135 - 145 mmol/L 141 140 137  Potassium 3.5 - 5.1 mmol/L 3.7 3.7 4.1  Chloride 98 - 111 mmol/L 106 102 100  CO2 22 - 32 mmol/L 28 32 29  Calcium 8.9 - 10.3 mg/dL 9.2 9.9 9.6  Total Protein 6.5 - 8.1 g/dL 6.9 7.5 7.4  Total Bilirubin 0.3 - 1.2 mg/dL <0.2(L) <0.2(L) <0.2(L)  Alkaline Phos 38 - 126 U/L 160(H) 154(H) 161(H)  AST 15 - 41 U/L 21 19 17   ALT 0 - 44 U/L 30 26 24       RADIOGRAPHIC STUDIES: I have personally reviewed the radiological images as listed and agreed with the findings in the report. No results found.   ASSESSMENT & PLAN:  Caitlyn Hendricks is a 39 y.o. 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 Her ER/PR/HER2 markers were all negative. CT CAP/Bone scan negative for distant mets.  -To downstage disease and reduce risk of recurrence, I started her on neoadjuvant chemo with ddAC q2weeks for 4 cycles beginning  03/22/20-05/03/20. This will be followed by weekly carbo/taxol for 12 weeks starting 05/17/20 before proceeding with surgery.  -Based on recently publish [id number] 366 trial data, I added Keytruda q3weeks for 1 year treatment on 04/28/20.  -S/p C3 chemo her skin rash has spread on upper body. Did improve with prednisone after cycle 2, but she did not use oral steroid this time. Her left breast mass remains non-palpable since C2 and left axilla barely palpable s/p C3, she is clinically responding to chemotherapy. -Labs reviewed and adequate to proceed with C4 AC today. Will watch for rash.  -Next Keytruda in 2 weeks. Start Carbo/Taxol in 2 weeks.  -She is interested in b/l mastectomy for symmetrical appearance along with reconstruction surgery. I will refer her to plastic surgeon near end of chemo.  -F/u in 2 weeks  2. Constipation, Nausea, Heartburn -Secondary to chemo -Nausea well controlled on compazine. She did not tolerate zofran due to headaches.  -She has increased constipation with possible hemorrhoids along with pain and blood with wiping. I recommend she increase Miralax each day and use SITS bath after each BM.  -For her heartburn I recommend she use Prilosec (05/03/20). She is agreeable.   3. Genetic Testing negative for pathogenetic mutations with VUS of POLE gene  4. Anxiety/Depression, Social Support  -She is currently on Xanax 19m TID and Celexa 536m This is managed by her PCP. -She is willing to f/u with SW for counseling as needed.  -She notes she has been stressed this past year and was binge drinking occasionally. She quit drinking 02/09/20 and does not plan to restart. She does not plan to restart smoking or vaping.  -She has very good social support from mother and boyfriend.  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 I offered seeing financial office and advocate if needed.  -I recommend she consult with financial  advocate to apply for [employer] -To help her sleep I called in low dose Ambien.  5. Skin rash, secondary to chemo -Was very mild and minimal after first dose but has progressively  increased. S/p C3, rash become painful. Did improved after topical steroid, and she did not take oral steroids since she is on Keytruda   -Continue Kenalog cream.  -Will continue to monitor after on last cycle AC today (05/03/20) and Taxol moving forward.    PLAN:  -I called in Prilosec and refilled Kenalog craem  -Labs reviewed and adequate to proceed with C4 AC today at same dose, premeds include dexa 23m  -Keytruda in 2, 5 weeks  -Lab, flush, f/u and weekly carbo/taxol in 2, 3, 4, 5 weeks.    No problem-specific Assessment & Plan notes found for this encounter.   No orders of the defined types were placed in this encounter.  All questions were answered. The patient knows to call the clinic with any problems, questions or concerns. No barriers to learning was detected. The total time spent in the appointment was 30 minutes.     YTruitt Merle MD 05/03/2020   I, AJoslyn Devon am acting as scribe for YTruitt Merle MD.   I have reviewed the above documentation for accuracy and completeness, and I agree with the above.

## 2020-04-28 NOTE — Progress Notes (Signed)
Per Dr. [name], okay to treat using labs from 04/19/2020.  Per Dr. [name], okay to treat with HR 112.   1445 Oxygen/HR at rest: 100%/116  1447 Oxygen/HR during ambulation:  100%/130

## 2020-04-28 NOTE — Patient Instructions (Addendum)
[facility] Discharge Instructions for Patients Receiving Chemotherapy  Today you received the following chemotherapy agents: keytruda  To help prevent nausea and vomiting after your treatment, we encourage you to take your nausea medication as directed.    If you develop nausea and vomiting that is not controlled by your nausea medication, call the clinic.   BELOW ARE SYMPTOMS THAT SHOULD BE REPORTED IMMEDIATELY:  *FEVER GREATER THAN 100.5 F  *CHILLS WITH OR WITHOUT FEVER  NAUSEA AND VOMITING THAT IS NOT CONTROLLED WITH YOUR NAUSEA MEDICATION  *UNUSUAL SHORTNESS OF BREATH  *UNUSUAL BRUISING OR BLEEDING  TENDERNESS IN MOUTH AND THROAT WITH OR WITHOUT PRESENCE OF ULCERS  *URINARY PROBLEMS  *BOWEL PROBLEMS  UNUSUAL RASH Items with * indicate a potential emergency and should be followed up as soon as possible.  Feel free to call the clinic should you have any questions or concerns. The clinic phone number is [phone].  Please show the CHEMO ALERT CARD at check-in to the Emergency Department and triage nurse.  Pembrolizumab injection What is this medicine? PEMBROLIZUMAB (pem broe liz ue mab) is a monoclonal antibody. It is used to treat certain types of cancer. This medicine may be used for other purposes; ask your health care provider or pharmacist if you have questions. COMMON BRAND NAME(S): Keytruda What should I tell my health care provider before I take this medicine? They need to know if you have any of these conditions:  diabetes  immune system problems  inflammatory bowel disease  liver disease  lung or breathing disease  lupus  received or scheduled to receive an organ transplant or a stem-cell transplant that uses donor stem cells  an unusual or allergic reaction to pembrolizumab, other medicines, foods, dyes, or preservatives  pregnant or trying to get pregnant  breast-feeding How should I use this medicine? This medicine is for  infusion into a vein. It is given by a health care professional in a hospital or clinic setting. A special MedGuide will be given to you before each treatment. Be sure to read this information carefully each time. Talk to your pediatrician regarding the use of this medicine in children. While this drug may be prescribed for children as young as 6 months for selected conditions, precautions do apply. Overdosage: If you think you have taken too much of this medicine contact a poison control center or emergency room at once. NOTE: This medicine is only for you. Do not share this medicine with others. What if I miss a dose? It is important not to miss your dose. Call your doctor or health care professional if you are unable to keep an appointment. What may interact with this medicine? Interactions have not been studied. Give your health care provider a list of all the medicines, herbs, non-prescription drugs, or dietary supplements you use. Also tell them if you smoke, drink alcohol, or use illegal drugs. Some items may interact with your medicine. This list may not describe all possible interactions. Give your health care provider a list of all the medicines, herbs, non-prescription drugs, or dietary supplements you use. Also tell them if you smoke, drink alcohol, or use illegal drugs. Some items may interact with your medicine. What should I watch for while using this medicine? Your condition will be monitored carefully while you are receiving this medicine. You may need blood work done while you are taking this medicine. Do not become pregnant while taking this medicine or for 4 months after stopping it. Women   should inform their doctor if they wish to become pregnant or think they might be pregnant. There is a potential for serious side effects to an unborn child. Talk to your health care professional or pharmacist for more information. Do not breast-feed an infant while taking this medicine or for 4  months after the last dose. What side effects may I notice from receiving this medicine? Side effects that you should report to your doctor or health care professional as soon as possible:  allergic reactions like skin rash, itching or hives, swelling of the face, lips, or tongue  bloody or black, tarry  breathing problems  changes in vision  chest pain  chills  confusion  constipation  cough  diarrhea  dizziness or feeling faint or lightheaded  fast or irregular heartbeat  fever  flushing  joint pain  low blood counts - this medicine may decrease the number of white blood cells, red blood cells and platelets. You may be at increased risk for infections and bleeding.  muscle pain  muscle weakness  pain, tingling, numbness in the hands or feet  persistent headache  redness, blistering, peeling or loosening of the skin, including inside the mouth  signs and symptoms of high blood sugar such as dizziness; dry mouth; dry skin; fruity breath; nausea; stomach pain; increased hunger or thirst; increased urination  signs and symptoms of kidney injury like trouble passing urine or change in the amount of urine  signs and symptoms of liver injury like dark urine, light-colored stools, loss of appetite, nausea, right upper belly pain, yellowing of the eyes or skin  sweating  swollen lymph nodes  weight loss Side effects that usually do not require medical attention (report to your doctor or health care professional if they continue or are bothersome):  decreased appetite  hair loss  muscle pain  tiredness This list may not describe all possible side effects. Call your doctor for medical advice about side effects. You may report side effects to FDA at 1-800-FDA-1088. Where should I keep my medicine? This drug is given in a hospital or clinic and will not be stored at home. NOTE: This sheet is a summary. It may not cover all possible information. If you have  questions about this medicine, talk to your doctor, pharmacist, or health care provider.  2020 Elsevier/Gold Standard (2019-04-30 18:07:58)  

## 2020-04-29 ENCOUNTER — Encounter: Payer: Self-pay | Admitting: Hematology

## 2020-04-29 ENCOUNTER — Encounter: Payer: Self-pay | Admitting: Nurse Practitioner

## 2020-04-29 LAB — T4: T4, Total: 5.1 ug/dL (ref 4.5–12.0)

## 2020-05-01 ENCOUNTER — Telehealth: Payer: Self-pay | Admitting: *Deleted

## 2020-05-01 LAB — TSH: TSH: 0.546 u[IU]/mL (ref 0.308–3.960)

## 2020-05-01 NOTE — Telephone Encounter (Signed)
Thanks Myrtle. Santiago Glad will call her back.   Truitt Merle MD

## 2020-05-01 NOTE — Telephone Encounter (Signed)
Called pt to see how she did with her Keytruda treatment.  She reports doing OK but has a rash which she says is from the Adriamycin that she received.  She has been taking Benadryl & has a cream that was given to her on Friday which is helping some.  Please see pt message to Dr [name] & Regan Rakers.  She feels that she can wait until Wed when she comes in.  She also mentions that she is having some rectal bleeding when bowels move & feels like razor blades in her rectum.  Discussed hemorrhoids & keeping stools soft & sitz baths, witch hazel & external hemorrhoid cream.  She reports taking miralax & stools are soft.  Message to Dr Feng/Pod RN

## 2020-05-01 NOTE — Telephone Encounter (Signed)
-----   Message from Wylene Men, RN sent at 04/28/2020  3:59 PM EDT ----- Regarding: FENG 1ST TIME KEYTRUDA Patient received 1st time Keytruda.  Tolerated well.  No s/s or c/o distress.

## 2020-05-02 ENCOUNTER — Other Ambulatory Visit: Payer: Self-pay | Admitting: Nurse Practitioner

## 2020-05-02 DIAGNOSIS — C50112 Malignant neoplasm of central portion of left female breast: Secondary | ICD-10-CM

## 2020-05-02 DIAGNOSIS — Z171 Estrogen receptor negative status [ER-]: Secondary | ICD-10-CM

## 2020-05-02 NOTE — Telephone Encounter (Signed)
Ms Vahey called.  She started the Decadron this am and the rash is getting better.

## 2020-05-03 ENCOUNTER — Inpatient Hospital Stay: Payer: 59

## 2020-05-03 ENCOUNTER — Encounter: Payer: Self-pay | Admitting: Hematology

## 2020-05-03 ENCOUNTER — Other Ambulatory Visit: Payer: Self-pay

## 2020-05-03 ENCOUNTER — Inpatient Hospital Stay (HOSPITAL_BASED_OUTPATIENT_CLINIC_OR_DEPARTMENT_OTHER): Payer: 59 | Admitting: Hematology

## 2020-05-03 ENCOUNTER — Encounter: Payer: Self-pay | Admitting: Radiology

## 2020-05-03 ENCOUNTER — Encounter: Payer: Self-pay | Admitting: *Deleted

## 2020-05-03 VITALS — BP 124/86 | HR 95 | Temp 97.8°F | Resp 18 | Ht 64.0 in | Wt 195.9 lb

## 2020-05-03 DIAGNOSIS — C50112 Malignant neoplasm of central portion of left female breast: Secondary | ICD-10-CM | POA: Diagnosis not present

## 2020-05-03 DIAGNOSIS — Z171 Estrogen receptor negative status [ER-]: Secondary | ICD-10-CM

## 2020-05-03 DIAGNOSIS — Z95828 Presence of other vascular implants and grafts: Secondary | ICD-10-CM

## 2020-05-03 DIAGNOSIS — Z5111 Encounter for antineoplastic chemotherapy: Secondary | ICD-10-CM | POA: Diagnosis not present

## 2020-05-03 LAB — CMP (CANCER CENTER ONLY)
ALT: 30 U/L (ref 0–44)
AST: 21 U/L (ref 15–41)
Albumin: 3.7 g/dL (ref 3.5–5.0)
Alkaline Phosphatase: 160 U/L — ABNORMAL HIGH (ref 38–126)
Anion gap: 7 (ref 5–15)
BUN: 16 mg/dL (ref 6–20)
CO2: 28 mmol/L (ref 22–32)
Calcium: 9.2 mg/dL (ref 8.9–10.3)
Chloride: 106 mmol/L (ref 98–111)
Creatinine: 0.7 mg/dL (ref 0.44–1.00)
GFR, Estimated: >60 mL/min (ref >60)
Glucose, Bld: 80 mg/dL (ref 70–99)
Potassium: 3.7 mmol/L (ref 3.5–5.1)
Sodium: 141 mmol/L (ref 135–145)
Total Bilirubin: <0.2 mg/dL — ABNORMAL LOW (ref 0.3–1.2)
Total Protein: 6.9 g/dL (ref 6.5–8.1)

## 2020-05-03 LAB — CBC WITH DIFFERENTIAL (CANCER CENTER ONLY)
Abs Immature Granulocytes: 1.29 10*3/uL — ABNORMAL HIGH (ref 0.00–0.07)
Basophils Absolute: 0 10*3/uL (ref 0.0–0.1)
Basophils Relative: 0 %
Eosinophils Absolute: 0 10*3/uL (ref 0.0–0.5)
Eosinophils Relative: 0 %
HCT: 31.9 % — ABNORMAL LOW (ref 36.0–46.0)
Hemoglobin: 10.3 g/dL — ABNORMAL LOW (ref 12.0–15.0)
Immature Granulocytes: 11 %
Lymphocytes Relative: 12 %
Lymphs Abs: 1.4 10*3/uL (ref 0.7–4.0)
MCH: 27.2 pg (ref 26.0–34.0)
MCHC: 32.3 g/dL (ref 30.0–36.0)
MCV: 84.4 fL (ref 80.0–100.0)
Monocytes Absolute: 0.7 10*3/uL (ref 0.1–1.0)
Monocytes Relative: 7 %
Neutro Abs: 7.9 10*3/uL — ABNORMAL HIGH (ref 1.7–7.7)
Neutrophils Relative %: 70 %
Platelet Count: 221 10*3/uL (ref 150–400)
RBC: 3.78 MIL/uL — ABNORMAL LOW (ref 3.87–5.11)
RDW: 16.9 % — ABNORMAL HIGH (ref 11.5–15.5)
WBC Count: 11.4 10*3/uL — ABNORMAL HIGH (ref 4.0–10.5)
nRBC: 0.5 % — ABNORMAL HIGH (ref 0.0–0.2)

## 2020-05-03 LAB — TSH: TSH: 2.452 u[IU]/mL (ref 0.308–3.960)

## 2020-05-03 MED ORDER — SODIUM CHLORIDE 0.9 % IV SOLN
Freq: Once | INTRAVENOUS | Status: AC
  Filled 2020-05-03: qty 250

## 2020-05-03 MED ORDER — SODIUM CHLORIDE 0.9 % IV SOLN
600.0000 mg/m2 | Freq: Once | INTRAVENOUS | Status: AC
  Administered 2020-05-03: 1160 mg via INTRAVENOUS
  Filled 2020-05-03: qty 58

## 2020-05-03 MED ORDER — PALONOSETRON HCL INJECTION 0.25 MG/5ML
INTRAVENOUS | Status: AC
  Filled 2020-05-03: qty 5

## 2020-05-03 MED ORDER — SODIUM CHLORIDE 0.9 % IV SOLN
10.0000 mg | Freq: Once | INTRAVENOUS | Status: AC
  Administered 2020-05-03: 10 mg via INTRAVENOUS
  Filled 2020-05-03: qty 10

## 2020-05-03 MED ORDER — SODIUM CHLORIDE 0.9 % IV SOLN
150.0000 mg | Freq: Once | INTRAVENOUS | Status: AC
  Administered 2020-05-03: 150 mg via INTRAVENOUS
  Filled 2020-05-03: qty 150

## 2020-05-03 MED ORDER — FAMOTIDINE IN NACL 20-0.9 MG/50ML-% IV SOLN
20.0000 mg | Freq: Once | INTRAVENOUS | Status: AC
  Administered 2020-05-03: 20 mg via INTRAVENOUS

## 2020-05-03 MED ORDER — FAMOTIDINE IN NACL 20-0.9 MG/50ML-% IV SOLN
INTRAVENOUS | Status: AC
  Filled 2020-05-03: qty 50

## 2020-05-03 MED ORDER — DOXORUBICIN HCL CHEMO IV INJECTION 2 MG/ML
60.0000 mg/m2 | Freq: Once | INTRAVENOUS | Status: AC
  Administered 2020-05-03: 116 mg via INTRAVENOUS
  Filled 2020-05-03: qty 58

## 2020-05-03 MED ORDER — SODIUM CHLORIDE 0.9% FLUSH
10.0000 mL | Freq: Once | INTRAVENOUS | Status: AC
  Administered 2020-05-03: 10 mL via INTRAVENOUS
  Filled 2020-05-03: qty 10

## 2020-05-03 MED ORDER — HEPARIN SOD (PORK) LOCK FLUSH 100 UNIT/ML IV SOLN
500.0000 [IU] | Freq: Once | INTRAVENOUS | Status: DC | PRN
  Filled 2020-05-03: qty 5

## 2020-05-03 MED ORDER — SODIUM CHLORIDE 0.9% FLUSH
10.0000 mL | INTRAVENOUS | Status: DC | PRN
  Filled 2020-05-03: qty 10

## 2020-05-03 MED ORDER — PALONOSETRON HCL INJECTION 0.25 MG/5ML
0.2500 mg | Freq: Once | INTRAVENOUS | Status: AC
  Administered 2020-05-03: 0.25 mg via INTRAVENOUS

## 2020-05-03 MED ORDER — TRIAMCINOLONE ACETONIDE 0.5 % EX OINT: 1.0000 "application " | TOPICAL_OINTMENT | Freq: Two times a day (BID) | CUTANEOUS | 2 refills | Status: DC

## 2020-05-03 MED ORDER — OMEPRAZOLE 20 MG PO CPDR: 20.0000 mg | DELAYED_RELEASE_CAPSULE | Freq: Two times a day (BID) | ORAL | 2 refills | Status: DC

## 2020-05-03 NOTE — Patient Instructions (Signed)
[facility] Discharge Instructions for Patients Receiving Chemotherapy  Today you received the following chemotherapy agents Adriamycin; Cytoxan  To help prevent nausea and vomiting after your treatment, we encourage you to take your nausea medication as directed   If you develop nausea and vomiting that is not controlled by your nausea medication, call the clinic.   BELOW ARE SYMPTOMS THAT SHOULD BE REPORTED IMMEDIATELY:  *FEVER GREATER THAN 100.5 F  *CHILLS WITH OR WITHOUT FEVER  NAUSEA AND VOMITING THAT IS NOT CONTROLLED WITH YOUR NAUSEA MEDICATION  *UNUSUAL SHORTNESS OF BREATH  *UNUSUAL BRUISING OR BLEEDING  TENDERNESS IN MOUTH AND THROAT WITH OR WITHOUT PRESENCE OF ULCERS  *URINARY PROBLEMS  *BOWEL PROBLEMS  UNUSUAL RASH Items with * indicate a potential emergency and should be followed up as soon as possible.  Feel free to call the clinic should you have any questions or concerns. The clinic phone number is [phone].  Please show the CHEMO ALERT CARD at check-in to the Emergency Department and triage nurse.   

## 2020-05-03 NOTE — Research (Signed)
[id number]:Studying how cancer treatment affects the nerves in your hands and feet.  05/03/20     9:40AM  REFERRAL VISIT: Received referral from Dr. [name] for Othella Boyer for [phone]. Myself and Clabe Seal, Clinical Research Coordinator met with Amariyah who was accompanied by Lattie Haw for about 5 minutes in a private exam room after her visit with Dr. [name] to discuss [phone] and DCP. I introduced myself and reviewed the study and encouraged the patient to take the consent and authorization forms home to review. I reviewed that participation in study is voluntary, the purpose of the study and what the study entails,including the assessments and time point of the neuropathy assessments, questionnaires and visits. We also reviewed how her information will be kept private. Cincere Deprey an opportunity to ask questions, but she did not have questions at this time. I also discussed DCP as an optional data collection study. Patient was provided with a copy of the consent for S1714 and DCP and authorization forms and my contact information, for her records. I thanked patient for her time today and interest in study and encouraged her to call with questions.I enjoyed meeting Annalyce today and look forward to speaking with her again soon.   PLAN: I discussed calling the patient near the end of the week to see if she may be interested in participating or see what further questions she may have. Patient completes A/C today and will start Taxol on 11/10.   Carol Ada, RT(R)(T) Clinical Research Coordinator

## 2020-05-03 NOTE — Progress Notes (Signed)
Ok to add pepcid 20mg  ivpb to today's treatment to help with rash.  T.O. Dr Lavonda Jumbo, PharmD

## 2020-05-04 ENCOUNTER — Inpatient Hospital Stay: Payer: 59

## 2020-05-04 ENCOUNTER — Other Ambulatory Visit: Payer: Self-pay

## 2020-05-04 VITALS — BP 122/89 | HR 95 | Temp 98.3°F | Resp 18

## 2020-05-04 DIAGNOSIS — Z5111 Encounter for antineoplastic chemotherapy: Secondary | ICD-10-CM | POA: Diagnosis not present

## 2020-05-04 DIAGNOSIS — C50112 Malignant neoplasm of central portion of left female breast: Secondary | ICD-10-CM

## 2020-05-04 LAB — T4: T4, Total: 5.8 ug/dL (ref 4.5–12.0)

## 2020-05-04 MED ORDER — PEGFILGRASTIM-JMDB 6 MG/0.6ML ~~LOC~~ SOSY
6.0000 mg | PREFILLED_SYRINGE | Freq: Once | SUBCUTANEOUS | Status: AC
  Administered 2020-05-04: 6 mg via SUBCUTANEOUS

## 2020-05-04 MED ORDER — PEGFILGRASTIM-JMDB 6 MG/0.6ML ~~LOC~~ SOSY
PREFILLED_SYRINGE | SUBCUTANEOUS | Status: AC
  Filled 2020-05-04: qty 0.6

## 2020-05-05 ENCOUNTER — Telehealth: Payer: Self-pay | Admitting: Hematology

## 2020-05-05 NOTE — Telephone Encounter (Signed)
No 10/27 los

## 2020-05-12 ENCOUNTER — Inpatient Hospital Stay: Payer: 59 | Attending: Hematology | Admitting: Radiology

## 2020-05-12 DIAGNOSIS — Z23 Encounter for immunization: Secondary | ICD-10-CM | POA: Insufficient documentation

## 2020-05-12 DIAGNOSIS — Z5112 Encounter for antineoplastic immunotherapy: Secondary | ICD-10-CM | POA: Insufficient documentation

## 2020-05-12 DIAGNOSIS — K5900 Constipation, unspecified: Secondary | ICD-10-CM | POA: Insufficient documentation

## 2020-05-12 DIAGNOSIS — T451X5A Adverse effect of antineoplastic and immunosuppressive drugs, initial encounter: Secondary | ICD-10-CM | POA: Insufficient documentation

## 2020-05-12 DIAGNOSIS — L270 Generalized skin eruption due to drugs and medicaments taken internally: Secondary | ICD-10-CM | POA: Insufficient documentation

## 2020-05-12 DIAGNOSIS — C50112 Malignant neoplasm of central portion of left female breast: Secondary | ICD-10-CM | POA: Insufficient documentation

## 2020-05-12 DIAGNOSIS — Z79899 Other long term (current) drug therapy: Secondary | ICD-10-CM | POA: Insufficient documentation

## 2020-05-12 DIAGNOSIS — R531 Weakness: Secondary | ICD-10-CM | POA: Insufficient documentation

## 2020-05-12 DIAGNOSIS — N644 Mastodynia: Secondary | ICD-10-CM | POA: Insufficient documentation

## 2020-05-12 DIAGNOSIS — Z171 Estrogen receptor negative status [ER-]: Secondary | ICD-10-CM | POA: Insufficient documentation

## 2020-05-12 DIAGNOSIS — R51 Headache with orthostatic component, not elsewhere classified: Secondary | ICD-10-CM | POA: Insufficient documentation

## 2020-05-12 DIAGNOSIS — Z5111 Encounter for antineoplastic chemotherapy: Secondary | ICD-10-CM | POA: Insufficient documentation

## 2020-05-12 DIAGNOSIS — F418 Other specified anxiety disorders: Secondary | ICD-10-CM | POA: Insufficient documentation

## 2020-05-16 NOTE — Progress Notes (Signed)
[place]   Telephone:(336) [phone] Fax:(336) [phone]   Clinic Follow up Note   Patient Care Team: [name], MD as PCP - General (Family Medicine) Mauro Kaufmann, RN as Oncology Nurse Navigator Rockwell Germany, RN as Oncology Nurse Navigator 05/17/2020  CHIEF COMPLAINT: Follow-up left breast cancer  SUMMARY OF ONCOLOGIC HISTORY: Oncology History Overview Note  Cancer Staging Cancer of central portion of left female breast [facility]) Staging form: Breast, AJCC 8th Edition - Clinical: No stage assigned - Unsigned    Cancer of central portion of left female breast ([place])  02/23/2020 Mammogram   Diagnostic Mammogram 02/23/20  IMPRESSION The 2x1x2.6cm irregular mass in teh left breast at 12:00 posiiton middle depth is highly suspicious of malignancy. An [place] is recommended for further evaluation and biopsy planning purposes.   02/23/2020 Breast US   [name] 02/23/20  IMPRESSION 2 adjacent spiculated masses in the left brast at 12:00 position 3 cm from the nipple (2.1x0.9x1.1cm and 1.1x1.4x0.5cm) is suggestive of malignancy.   Multiple abnormal left subpectoral and left axillary nodes measuring 3.3x2.1 cm concerning for metastatic adenopathy.    Left breast skin thickening and edema may be secondary congestive edema due to extensive axillary adenopathy.    03/08/2020 Initial Biopsy   Diagnosis 1.Breast, left, needle core biopsy, 12:00 position, 3cmfn -INVASIVE DUCTAL CARCINOMA -SEE COMMENT  2. Lymph node, needle/core biopsy, left axilla -METASTATIC CARCINOMA INVOLVING A LYMPH NODE  -LYMPHOVASCULAR SPACE INVASION PRESENT   Microscopic Comment  1.Based on the biopsy the carcinoma appears Nottingham Grade 3 or 3 and measures 1 cm in the greatest linear extent.    03/08/2020 Receptors her2   ER- Negative 0% PR - Negative 0% HER2 - Negative  KI 67 - 80%    03/08/2020 Cancer Staging   Staging form: Breast, AJCC 8th Edition - Clinical stage from  03/08/2020: Stage IIIB (cT2, cN1, cM0, G3, ER-, PR-, HER2-) - Signed by Truitt Merle, MD on 03/10/2020   03/10/2020 Initial Diagnosis   Cancer of central portion of left female breast ([place])   03/16/2020 Breast MRI   IMPRESSION: 1. 8.1 x 7.8 x 6.6 cm biopsy proven invasive ductal carcinoma in the central right breast, involving 3 quadrants. 2. 3.0 x 1.7 x 1.1 cm satellite mass more inferiorly in lower inner quadrant of the left breast, compatible with additional malignancy. 3. Metastatic level 1 and level 2 left axillary lymph nodes. 4. No evidence of malignancy on the right.   03/17/2020 Imaging   IMPRESSION: CT CAP w contrast  1. Diffuse skin thickening in the left breast with left axillary and subpectoral lymphadenopathy, as well as a mildly enlarged left supraclavicular lymph node, which likely represents metastatic lymphadenopathy. No other definite extra nodal metastatic disease noted elsewhere in the chest, abdomen or pelvis. 2. Large mass in the central anatomic pelvis which is of uncertain origin, potentially a large exophytic fibroid or a large solid mass arising from the right ovary. Further evaluation with pelvic ultrasound is strongly recommended.   03/21/2020 Imaging   Bone Scan  IMPRESSION: Apparent arthropathy at L5. No bony metastatic disease is demonstrable on this study. Scattered foci of abnormal uptake in a pelvic mass is of uncertain etiology given absence of calcification in this mass by CT. This mass compresses the urinary bladder. It is possible that some of the increased uptake in this area actually represents physiologic uptake within the bladder.   Kidneys noted in flank positions bilaterally.     03/22/2020 -  Neo-Adjuvant Chemotherapy   Neoadjuvant Adriamycin and Cytoxan q2weeks for 4 cycles starting 03/22/20-05/03/20 followed by weekly Taxol and Carboplatin for 12 weeks starting 05/17/20   03/24/2020 Imaging   US Pelvis  IMPRESSION: 1. Large pedunculated  lesion directly contiguous with the uterine most suggestive of a large subserosal fibroid which measures up to 8.2 cm. 2. Additional 1 cm probable intramural fibroid in the right anterior uterine body. 3. No other acute or worrisome pelvic abnormality.   04/28/2020 -  Antibody Plan   Added Keytruda q3weeks starting 04/28/20 to complete 1 year of treatment      CURRENT THERAPY:  Neoadjuvant chemo ddACq2weeks for 4 cyclesstarting 03/22/20-05/03/20, followed by weekly carbo/taxolfor 12 weeks starting 05/17/20. -Added Keytruda q3weeks to complete 1 year of treatmenton 04/28/20  INTERVAL HISTORY: Caitlyn Hendricks returns for follow-up and treatment as scheduled.  Caitlyn Hendricks completed cycle 4 AC on 10/27 with Fulphila.  Caitlyn Hendricks is doing well.  Caitlyn Hendricks usually gets a rash on Caitlyn Hendricks chest and back a week after chemo that is dry by the time Caitlyn Hendricks next infusion arrives.  Caitlyn Hendricks is using Kenalog which is helpful.  Fatigue lasts 1 week then Caitlyn Hendricks recovers.  Caitlyn Hendricks feels Caitlyn Hendricks is overeating and eating too much sweets, gaining weight.  Denies mucositis.  No nausea, vomiting, constipation, diarrhea.  About a week ago Caitlyn Hendricks developed a "dull pinching, pulling" sensation in the right inner lower breast. Discomfort radiates to Caitlyn Hendricks back similar to the left breast cancer when Caitlyn Hendricks was diagnosed. This has been extremely stressful for Caitlyn Hendricks.  Caitlyn Hendricks wonders if Caitlyn Hendricks has strained a muscle throwing the ball for Caitlyn Hendricks dogs or sleeping in a bad position.  Otherwise, denies fever, chills, cough, chest pain, dyspnea.  Denies baseline neuropathy, declined to be in the study due to potential transportation issues while on study.   MEDICAL HISTORY:  Past Medical History:  Diagnosis Date  . Anxiety   . Depression     SURGICAL HISTORY: Past Surgical History:  Procedure Laterality Date  . PORTACATH PLACEMENT Right 03/21/2020   Procedure: INSERTION PORT-A-CATH WITH ULTRASOUND GUIDANCE;  Surgeon: [name], MD;  Location: [facility];  Service: General;  Laterality: Right;    I have reviewed the social history and family history with the patient and they are unchanged from previous note.  ALLERGIES:  is allergic to medroxyprogesterone.  MEDICATIONS:  Current Outpatient Medications  Medication Sig Dispense Refill  . ALPRAZolam (XANAX) 0.5 MG tablet Take 0.5 mg by mouth 3 (three) times daily.    Marland Kitchen ALPRAZolam (XANAX) 1 MG tablet Take 1 mg by mouth 3 (three) times daily as needed for anxiety.     . citalopram (CELEXA) 20 MG tablet Take 20 mg by mouth daily.    . fluconazole (DIFLUCAN) 150 MG tablet Take 1 tablet for a yeast infection if needed, may repeat in 3 days if needed Use if needed while on antibiotic. 2 tablet 0  . lidocaine-prilocaine (EMLA) cream Apply to affected area once 30 g 3  . omeprazole (PRILOSEC) 20 MG capsule Take 1 capsule (20 mg total) by mouth 2 (two) times daily before a meal. 60 capsule 2  . ondansetron (ZOFRAN) 8 MG tablet Take 1 tablet (8 mg total) by mouth 2 (two) times daily as needed. Start on the third day after chemotherapy. 30 tablet 2  . prochlorperazine (COMPAZINE) 10 MG tablet Take 1 tablet (10 mg total) by mouth every 6 (six) hours as needed (Nausea or vomiting). 30 tablet 2  . sertraline (  ZOLOFT) 50 MG tablet Take by mouth.    . traMADol (ULTRAM) 50 MG tablet Take 1 tablet (50 mg total) by mouth daily as needed for severe pain (headache). 30 tablet 0  . triamcinolone ointment (KENALOG) 0.5 % Apply 1 application topically 2 (two) times daily. 30 g 2  . zolpidem (AMBIEN) 10 MG tablet Take 1 tablet (10 mg total) by mouth at bedtime as needed for sleep. 30 tablet 1  . baclofen (LIORESAL) 10 MG tablet Take 1 tablet (10 mg total) by mouth 2 (two) times daily as needed for muscle spasms. 30 each 0  . dexamethasone (DECADRON) 4 MG tablet Take 2 tablets by mouth daily starting the day after Carboplatin and Cytoxan x 3 days. Take with food. 30 tablet 1  . methylPREDNISolone (MEDROL DOSEPAK)  4 MG TBPK tablet Take 1 tablet (4 mg total) by mouth taper from 4 doses each day to 1 dose and stop. 1 each 0   No current facility-administered medications for this visit.   Facility-Administered Medications Ordered in Other Visits  Medication Dose Route Frequency Provider Last Rate Last Admin  . sodium chloride flush (NS) 0.9 % injection 10 mL  10 mL Intravenous PRN Alla Feeling, NP   10 mL at 03/28/20 1104  . sodium chloride flush (NS) 0.9 % injection 10 mL  10 mL Intracatheter PRN Truitt Merle, MD   10 mL at 05/17/20 1454    PHYSICAL EXAMINATION: ECOG PERFORMANCE STATUS: 1 - Symptomatic but completely ambulatory  Vitals:   05/17/20 0923  BP: 136/82  Pulse: (!) 104  Resp: 17  Temp: 99.1 F (37.3 C)  SpO2: 100%   Filed Weights   05/17/20 0923  Weight: 194 lb 8 oz (88.2 kg)    GENERAL:alert, no distress and comfortable SKIN: Maculopapular rash to chest and back EYES: sclera clear LYMPH:  no palpable cervical or supraclavicular lymphadenopathy  LUNGS:  normal breathing effort HEART: regular rate & rhythm, no lower extremity edema ABDOMEN:abdomen soft, non-tender and normal bowel sounds Musculoskeletal: No focal tenderness NEURO: alert & oriented x 3 with fluent speech, no focal motor/sensory deficits Left breast exam: 0.5-1 cm palpable left axillary lymph node (previously 3 x 2 cm) the previously palpable 4 x 6 cm mass in the 11-12 position left breast remains not palpable.  Right breast exam: No nipple discharge or inversion, no erythema, edema, or palpable mass or tenderness in the right breast or axilla that I could appreciate PAC without erythema  LABORATORY DATA:  I have reviewed the data as listed CBC Latest Ref Rng & Units 05/17/2020 05/03/2020 04/19/2020  WBC 4.0 - 10.5 K/uL 9.4 11.4(H) 16.7(H)  Hemoglobin 12.0 - 15.0 g/dL 10.4(L) 10.3(L) 11.0(L)  Hematocrit 36 - 46 % 32.2(L) 31.9(L) 34.3(L)  Platelets 150 - 400 K/uL 293 221 239     CMP Latest Ref Rng & Units  05/17/2020 05/03/2020 04/19/2020  Glucose 70 - 99 mg/dL 102(H) 80 70  BUN 6 - 20 mg/dL 14 16 12   Creatinine 0.44 - 1.00 mg/dL 0.75 0.70 0.76  Sodium 135 - 145 mmol/L 140 141 140  Potassium 3.5 - 5.1 mmol/L 3.8 3.7 3.7  Chloride 98 - 111 mmol/L 107 106 102  CO2 22 - 32 mmol/L 26 28 32  Calcium 8.9 - 10.3 mg/dL 9.2 9.2 9.9  Total Protein 6.5 - 8.1 g/dL 7.1 6.9 7.5  Total Bilirubin 0.3 - 1.2 mg/dL 0.3 <0.2(L) <0.2(L)  Alkaline Phos 38 - 126 U/L 155(H) 160(H) 154(H)  AST  15 - 41 U/L 16 21 19   ALT 0 - 44 U/L 17 30 26       RADIOGRAPHIC STUDIES: I have personally reviewed the radiological images as listed and agreed with the findings in the report. No results found.   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 Caitlyn Hendricks ER/PR/HER2 markers were all negative.  Staging work-up negative for distant metastasis -To downstage Caitlyn Hendricks disease and reduce recurrence risk Caitlyn Hendricks started neoadjuvant chemo with DD AC every 2 weeks for 4 cycles 03/22/2020-05/03/2020, followed by weekly carbo/Taxol for 12 weeks starting 05/17/2020 -Based on recently published keynote 522 trial data, pembrolizumab q 3 weeks was added, for a total of 1 year, starting 04/28/2020 -Baseline echo was normal, EF 60-65%  2. Headache, constipation, fatigue -With cycle 1 AC; headaches likely related to chemo vs steroids vs anti-emetics. Improved with IVF and tramadol in clinic -Avoiding steroids now that Caitlyn Hendricks is on [place] with supportive care  3. Genetic Testing -Given Caitlyn Hendricks young age, Caitlyn Hendricks MGM's breast cancer and father  prostate cancer, Caitlyn Hendricks is eligible for genetic testing.  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 Anxiety/Depression, Social Support  -Caitlyn Hendricks is currently on Xanax 4m TID and Celexa 5110m This is managed by Caitlyn Hendricks PCP. -Caitlyn Hendricks has been more anxious with Caitlyn Hendricks cancer diagnosis and would like dose increase. -Caitlyn Hendricks has been referred to social work, and CuTeacher, English as a foreign languageShe owns Caitlyn Hendricks DoDevelopment worker, international aidnd notes Caitlyn Hendricks does not plan to work during this treatment time.Caitlyn Hendricks was given our financial advocate's contact information  5. Right breast pain -developed ~ 05/08/20, no obvious injury or strain, but possibly related to MSK - overuse (ball throwing) or strain vs sleep position.  -will treat with supportive heat, NSAID, baclofen PRN and monitor for a week -if no improvement, consider right breast mammo/US.   Disposition: Ms. PrMoricippears stable.  Caitlyn Hendricks completed 4 cycles of neoadjuvant AC with Udenyca.  Caitlyn Hendricks tolerated treatment well with skin rash and fatigue.  Side effects were well managed with supportive meds at home.  Caitlyn Hendricks was able to recover and function well.  Caitlyn Hendricks breast exam is consistent with a treatment response.  Caitlyn Hendricks developed pain in the right breast, exam is benign.  Question muscle strain.  I recommend symptom management with heat, NSAIDs, and muscle relaxer over the next few days.  Caitlyn Hendricks will notify me if symptoms worsen or fail to improve.  Caitlyn Hendricks is very stressed about this could be related to Caitlyn Hendricks cancer.  If pain is no better will consider evaluation with mammogram/ultrasound.  We reviewed the CBC and CMP from today.  TSH remains normal.  Caitlyn Hendricks will proceed with cycle 1 carbo/Taxol and cycle 2 pembrolizumab.  We again reviewed potential side effects and symptom management.  Caitlyn Hendricks has no baseline neuropathy, declined the study but plans to do cryotherapy.  Will monitor closely.  Follow-up next week with second weekly carbo/Taxol.  All questions  were answered. The patient knows to call the clinic with any problems, questions or concerns. No barriers to learning were detected. Total encounter time was 30 minutes.  Alla Feeling, NP 05/17/20

## 2020-05-17 ENCOUNTER — Inpatient Hospital Stay: Payer: 59

## 2020-05-17 ENCOUNTER — Encounter: Payer: Self-pay | Admitting: Nurse Practitioner

## 2020-05-17 ENCOUNTER — Inpatient Hospital Stay (HOSPITAL_BASED_OUTPATIENT_CLINIC_OR_DEPARTMENT_OTHER): Payer: 59 | Admitting: Nurse Practitioner

## 2020-05-17 ENCOUNTER — Encounter: Payer: Self-pay | Admitting: Radiology

## 2020-05-17 ENCOUNTER — Other Ambulatory Visit: Payer: Self-pay

## 2020-05-17 VITALS — BP 121/88 | HR 85 | Temp 98.7°F | Resp 18

## 2020-05-17 VITALS — BP 136/82 | HR 104 | Temp 99.1°F | Resp 17 | Ht 64.0 in | Wt 194.5 lb

## 2020-05-17 DIAGNOSIS — L270 Generalized skin eruption due to drugs and medicaments taken internally: Secondary | ICD-10-CM | POA: Diagnosis not present

## 2020-05-17 DIAGNOSIS — F418 Other specified anxiety disorders: Secondary | ICD-10-CM | POA: Diagnosis not present

## 2020-05-17 DIAGNOSIS — Z171 Estrogen receptor negative status [ER-]: Secondary | ICD-10-CM

## 2020-05-17 DIAGNOSIS — C50112 Malignant neoplasm of central portion of left female breast: Secondary | ICD-10-CM

## 2020-05-17 DIAGNOSIS — Z5112 Encounter for antineoplastic immunotherapy: Secondary | ICD-10-CM | POA: Diagnosis not present

## 2020-05-17 DIAGNOSIS — Z95828 Presence of other vascular implants and grafts: Secondary | ICD-10-CM

## 2020-05-17 DIAGNOSIS — Z79899 Other long term (current) drug therapy: Secondary | ICD-10-CM | POA: Diagnosis not present

## 2020-05-17 DIAGNOSIS — R531 Weakness: Secondary | ICD-10-CM | POA: Diagnosis not present

## 2020-05-17 DIAGNOSIS — T451X5A Adverse effect of antineoplastic and immunosuppressive drugs, initial encounter: Secondary | ICD-10-CM | POA: Diagnosis not present

## 2020-05-17 DIAGNOSIS — N644 Mastodynia: Secondary | ICD-10-CM | POA: Diagnosis not present

## 2020-05-17 DIAGNOSIS — Z23 Encounter for immunization: Secondary | ICD-10-CM | POA: Diagnosis not present

## 2020-05-17 DIAGNOSIS — K5900 Constipation, unspecified: Secondary | ICD-10-CM | POA: Diagnosis not present

## 2020-05-17 DIAGNOSIS — R51 Headache with orthostatic component, not elsewhere classified: Secondary | ICD-10-CM | POA: Diagnosis not present

## 2020-05-17 DIAGNOSIS — Z5111 Encounter for antineoplastic chemotherapy: Secondary | ICD-10-CM | POA: Diagnosis not present

## 2020-05-17 LAB — CMP (CANCER CENTER ONLY)
ALT: 17 U/L (ref 0–44)
AST: 16 U/L (ref 15–41)
Albumin: 3.6 g/dL (ref 3.5–5.0)
Alkaline Phosphatase: 155 U/L — ABNORMAL HIGH (ref 38–126)
Anion gap: 7 (ref 5–15)
BUN: 14 mg/dL (ref 6–20)
CO2: 26 mmol/L (ref 22–32)
Calcium: 9.2 mg/dL (ref 8.9–10.3)
Chloride: 107 mmol/L (ref 98–111)
Creatinine: 0.75 mg/dL (ref 0.44–1.00)
GFR, Estimated: >60 mL/min (ref >60)
Glucose, Bld: 102 mg/dL — ABNORMAL HIGH (ref 70–99)
Potassium: 3.8 mmol/L (ref 3.5–5.1)
Sodium: 140 mmol/L (ref 135–145)
Total Bilirubin: 0.3 mg/dL (ref 0.3–1.2)
Total Protein: 7.1 g/dL (ref 6.5–8.1)

## 2020-05-17 LAB — CBC WITH DIFFERENTIAL (CANCER CENTER ONLY)
Abs Immature Granulocytes: 0.23 10*3/uL — ABNORMAL HIGH (ref 0.00–0.07)
Basophils Absolute: 0.1 10*3/uL (ref 0.0–0.1)
Basophils Relative: 1 %
Eosinophils Absolute: 0 10*3/uL (ref 0.0–0.5)
Eosinophils Relative: 0 %
HCT: 32.2 % — ABNORMAL LOW (ref 36.0–46.0)
Hemoglobin: 10.4 g/dL — ABNORMAL LOW (ref 12.0–15.0)
Immature Granulocytes: 2 %
Lymphocytes Relative: 9 %
Lymphs Abs: 0.9 10*3/uL (ref 0.7–4.0)
MCH: 27.4 pg (ref 26.0–34.0)
MCHC: 32.3 g/dL (ref 30.0–36.0)
MCV: 84.7 fL (ref 80.0–100.0)
Monocytes Absolute: 0.7 10*3/uL (ref 0.1–1.0)
Monocytes Relative: 8 %
Neutro Abs: 7.6 10*3/uL (ref 1.7–7.7)
Neutrophils Relative %: 80 %
Platelet Count: 293 10*3/uL (ref 150–400)
RBC: 3.8 MIL/uL — ABNORMAL LOW (ref 3.87–5.11)
RDW: 17 % — ABNORMAL HIGH (ref 11.5–15.5)
WBC Count: 9.4 10*3/uL (ref 4.0–10.5)
nRBC: 0 % (ref 0.0–0.2)

## 2020-05-17 LAB — TSH: TSH: 0.846 u[IU]/mL (ref 0.308–3.960)

## 2020-05-17 MED ORDER — FAMOTIDINE IN NACL 20-0.9 MG/50ML-% IV SOLN
20.0000 mg | Freq: Once | INTRAVENOUS | Status: AC
  Administered 2020-05-17: 20 mg via INTRAVENOUS

## 2020-05-17 MED ORDER — HEPARIN SOD (PORK) LOCK FLUSH 100 UNIT/ML IV SOLN
500.0000 [IU] | Freq: Once | INTRAVENOUS | Status: AC | PRN
  Administered 2020-05-17: 500 [IU]
  Filled 2020-05-17: qty 5

## 2020-05-17 MED ORDER — BACLOFEN 10 MG PO TABS: 10.0000 mg | ORAL_TABLET | Freq: Two times a day (BID) | ORAL | 0 refills | Status: DC | PRN

## 2020-05-17 MED ORDER — METHYLPREDNISOLONE SODIUM SUCC 40 MG IJ SOLR
20.0000 mg | Freq: Once | INTRAMUSCULAR | Status: AC
  Administered 2020-05-17: 20 mg via INTRAVENOUS

## 2020-05-17 MED ORDER — PALONOSETRON HCL INJECTION 0.25 MG/5ML
0.2500 mg | Freq: Once | INTRAVENOUS | Status: AC
  Administered 2020-05-17: 0.25 mg via INTRAVENOUS

## 2020-05-17 MED ORDER — SODIUM CHLORIDE 0.9 % IV SOLN
223.9500 mg | Freq: Once | INTRAVENOUS | Status: AC
  Administered 2020-05-17: 220 mg via INTRAVENOUS
  Filled 2020-05-17: qty 22

## 2020-05-17 MED ORDER — DIPHENHYDRAMINE HCL 50 MG/ML IJ SOLN
INTRAMUSCULAR | Status: AC
  Filled 2020-05-17: qty 1

## 2020-05-17 MED ORDER — PALONOSETRON HCL INJECTION 0.25 MG/5ML
INTRAVENOUS | Status: AC
  Filled 2020-05-17: qty 5

## 2020-05-17 MED ORDER — SODIUM CHLORIDE 0.9 % IV SOLN
80.0000 mg/m2 | Freq: Once | INTRAVENOUS | Status: AC
  Administered 2020-05-17: 156 mg via INTRAVENOUS
  Filled 2020-05-17: qty 26

## 2020-05-17 MED ORDER — METHYLPREDNISOLONE SODIUM SUCC 40 MG IJ SOLR
INTRAMUSCULAR | Status: AC
  Filled 2020-05-17: qty 1

## 2020-05-17 MED ORDER — SODIUM CHLORIDE 0.9% FLUSH
10.0000 mL | INTRAVENOUS | Status: DC | PRN
  Administered 2020-05-17: 10 mL
  Filled 2020-05-17: qty 10

## 2020-05-17 MED ORDER — SODIUM CHLORIDE 0.9% FLUSH
10.0000 mL | Freq: Once | INTRAVENOUS | Status: AC
  Administered 2020-05-17: 10 mL via INTRAVENOUS
  Filled 2020-05-17: qty 10

## 2020-05-17 MED ORDER — SODIUM CHLORIDE 0.9 % IV SOLN
200.0000 mg | Freq: Once | INTRAVENOUS | Status: AC
  Administered 2020-05-17: 200 mg via INTRAVENOUS
  Filled 2020-05-17: qty 8

## 2020-05-17 MED ORDER — DIPHENHYDRAMINE HCL 50 MG/ML IJ SOLN
50.0000 mg | Freq: Once | INTRAMUSCULAR | Status: AC
  Administered 2020-05-17: 50 mg via INTRAVENOUS

## 2020-05-17 MED ORDER — FAMOTIDINE IN NACL 20-0.9 MG/50ML-% IV SOLN
INTRAVENOUS | Status: AC
  Filled 2020-05-17: qty 50

## 2020-05-17 MED ORDER — SODIUM CHLORIDE 0.9 % IV SOLN
Freq: Once | INTRAVENOUS | Status: AC
  Filled 2020-05-17: qty 250

## 2020-05-17 NOTE — Research (Signed)
[id number]:Studying how cancer treatment affects the nerves in your hands and feet.  05/17/2020     8:50 AM  VISIT: Met with Suzetta in waiting room to sign consent and patient decided she did not want to pursue study due to transportation issues. I expressed my understanding and appreciate Tinsley letting me know. I will speak with her during infusion about DCP. I appreciate Anum's time and look forward to speaking with her soon.  Carol Ada, RT(R)(T) Clinical Research Coordinator

## 2020-05-17 NOTE — Patient Instructions (Signed)

## 2020-05-17 NOTE — Patient Instructions (Addendum)
[facility] Dis[facility] Instructions for Patients Receiving Chemotherapy  Today you received the following chemotherapy agents: Keytruda, Taxol, and Carboplatin.   To help prevent nausea and vomiting after your treatment, we encourage you to take your nausea medication as directed.     If you develop nausea and vomiting that is not controlled by your nausea medication, call the clinic.   BELOW ARE SYMPTOMS THAT SHOULD BE REPORTED IMMEDIATELY:  *FEVER GREATER THAN 100.5 F  *CHILLS WITH OR WITHOUT FEVER  NAUSEA AND VOMITING THAT IS NOT CONTROLLED WITH YOUR NAUSEA MEDICATION  *UNUSUAL SHORTNESS OF BREATH  *UNUSUAL BRUISING OR BLEEDING  TENDERNESS IN MOUTH AND THROAT WITH OR WITHOUT PRESENCE OF ULCERS  *URINARY PROBLEMS  *BOWEL PROBLEMS  UNUSUAL RASH Items with * indicate a potential emergency and should be followed up as soon as possible.  Feel free to call the clinic should you have any questions or concerns. The clinic phone number is [phone].  Please show the [place] at check-in to the Emergency Department and triage nurse.   Paclitaxel injection What is this medicine? PACLITAXEL (PAK li TAX el) is a chemotherapy drug. It targets fast dividing cells, like cancer cells, and causes these cells to die. This medicine is used to treat ovarian cancer, breast cancer, lung cancer, Kaposi's sarcoma, and other cancers. This medicine may be used for other purposes; ask your health care provider or pharmacist if you have questions. COMMON BRAND NAME(S): Onxol, Taxol What should I tell my health care provider before I take this medicine? They need to know if you have any of these conditions:  history of irregular heartbeat  liver disease  low blood counts, like low white cell, platelet, or red cell counts  lung or breathing disease, like asthma  tingling of the fingers or toes, or other nerve disorder  an unusual or allergic reaction to paclitaxel,  alcohol, polyoxyethylated castor oil, other chemotherapy, other medicines, foods, dyes, or preservatives  pregnant or trying to get pregnant  breast-feeding How should I use this medicine? This drug is given as an infusion into a vein. It is administered in a hospital or clinic by a specially trained health care professional. Talk to your pediatrician regarding the use of this medicine in children. Special care may be needed. Overdosage: If you think you have taken too much of this medicine contact a poison control center or emergency room at once. NOTE: This medicine is only for you. Do not share this medicine with others. What if I miss a dose? It is important not to miss your dose. Call your doctor or health care professional if you are unable to keep an appointment. What may interact with this medicine? Do not take this medicine with any of the following medications:  disulfiram  metronidazole This medicine may also interact with the following medications:  antiviral medicines for hepatitis, HIV or AIDS  certain antibiotics like erythromycin and clarithromycin  certain medicines for fungal infections like ketoconazole and itraconazole  certain medicines for seizures like carbamazepine, phenobarbital, phenytoin  gemfibrozil  nefazodone  rifampin  St. John's wort This list may not describe all possible interactions. Give your health care provider a list of all the medicines, herbs, non-prescription drugs, or dietary supplements you use. Also tell them if you smoke, drink alcohol, or use illegal drugs. Some items may interact with your medicine. What should I watch for while using this medicine? Your condition will be monitored carefully while you are receiving this  medicine. You will need important blood work done while you are taking this medicine. This medicine can cause serious allergic reactions. To reduce your risk you will need to take other medicine(s) before treatment  with this medicine. If you experience allergic reactions like skin rash, itching or hives, swelling of the face, lips, or tongue, tell your doctor or health care professional right away. In some cases, you may be given additional medicines to help with side effects. Follow all directions for their use. This drug may make you feel generally unwell. This is not uncommon, as chemotherapy can affect healthy cells as well as cancer cells. Report any side effects. Continue your course of treatment even though you feel ill unless your doctor tells you to stop. Call your doctor or health care professional for advice if you get a fever, chills or sore throat, or other symptoms of a cold or flu. Do not treat yourself. This drug decreases your body's ability to fight infections. Try to avoid being around people who are sick. This medicine may increase your risk to bruise or bleed. Call your doctor or health care professional if you notice any unusual bleeding. Be careful brushing and flossing your teeth or using a toothpick because you may get an infection or bleed more easily. If you have any dental work done, tell your dentist you are receiving this medicine. Avoid taking products that contain aspirin, acetaminophen, ibuprofen, naproxen, or ketoprofen unless instructed by your doctor. These medicines may hide a fever. Do not become pregnant while taking this medicine. Women should inform their doctor if they wish to become pregnant or think they might be pregnant. There is a potential for serious side effects to an unborn child. Talk to your health care professional or pharmacist for more information. Do not breast-feed an infant while taking this medicine. Men are advised not to father a child while receiving this medicine. This product may contain alcohol. Ask your pharmacist or healthcare provider if this medicine contains alcohol. Be sure to tell all healthcare providers you are taking this medicine. Certain  medicines, like metronidazole and disulfiram, can cause an unpleasant reaction when taken with alcohol. The reaction includes flushing, headache, nausea, vomiting, sweating, and increased thirst. The reaction can last from 30 minutes to several hours. What side effects may I notice from receiving this medicine? Side effects that you should report to your doctor or health care professional as soon as possible:  allergic reactions like skin rash, itching or hives, swelling of the face, lips, or tongue  breathing problems  changes in vision  fast, irregular heartbeat  high or low blood pressure  mouth sores  pain, tingling, numbness in the hands or feet  signs of decreased platelets or bleeding - bruising, pinpoint red spots on the skin, black, tarry stools, blood in the urine  signs of decreased red blood cells - unusually weak or tired, feeling faint or lightheaded, falls  signs of infection - fever or chills, cough, sore throat, pain or difficulty passing urine  signs and symptoms of liver injury like dark yellow or brown urine; general ill feeling or flu-like symptoms; light-colored stools; loss of appetite; nausea; right upper belly pain; unusually weak or tired; yellowing of the eyes or skin  swelling of the ankles, feet, hands  unusually slow heartbeat Side effects that usually do not require medical attention (report to your doctor or health care professional if they continue or are bothersome):  diarrhea  hair loss  loss of  appetite  muscle or joint pain  nausea, vomiting  pain, redness, or irritation at site where injected  tiredness This list may not describe all possible side effects. Call your doctor for medical advice about side effects. You may report side effects to FDA at 1-800-FDA-1088. Where should I keep my medicine? This drug is given in a hospital or clinic and will not be stored at home. NOTE: This sheet is a summary. It may not cover all possible  information. If you have questions about this medicine, talk to your doctor, pharmacist, or health care provider.  2020 Elsevier/Gold Standard (2017-02-25 13:14:55)  Carboplatin injection What is this medicine? CARBOPLATIN (KAR boe pla tin) is a chemotherapy drug. It targets fast dividing cells, like cancer cells, and causes these cells to die. This medicine is used to treat ovarian cancer and many other cancers. This medicine may be used for other purposes; ask your health care provider or pharmacist if you have questions. COMMON BRAND NAME(S): Paraplatin What should I tell my health care provider before I take this medicine? They need to know if you have any of these conditions:  blood disorders  hearing problems  kidney disease  recent or ongoing radiation therapy  an unusual or allergic reaction to carboplatin, cisplatin, other chemotherapy, other medicines, foods, dyes, or preservatives  pregnant or trying to get pregnant  breast-feeding How should I use this medicine? This drug is usually given as an infusion into a vein. It is administered in a hospital or clinic by a specially trained health care professional. Talk to your pediatrician regarding the use of this medicine in children. Special care may be needed. Overdosage: If you think you have taken too much of this medicine contact a poison control center or emergency room at once. NOTE: This medicine is only for you. Do not share this medicine with others. What if I miss a dose? It is important not to miss a dose. Call your doctor or health care professional if you are unable to keep an appointment. What may interact with this medicine?  medicines for seizures  medicines to increase blood counts like filgrastim, pegfilgrastim, sargramostim  some antibiotics like amikacin, gentamicin, neomycin, streptomycin, tobramycin  vaccines Talk to your doctor or health care professional before taking any of these  medicines:  acetaminophen  aspirin  ibuprofen  ketoprofen  naproxen This list may not describe all possible interactions. Give your health care provider a list of all the medicines, herbs, non-prescription drugs, or dietary supplements you use. Also tell them if you smoke, drink alcohol, or use illegal drugs. Some items may interact with your medicine. What should I watch for while using this medicine? Your condition will be monitored carefully while you are receiving this medicine. You will need important blood work done while you are taking this medicine. This drug may make you feel generally unwell. This is not uncommon, as chemotherapy can affect healthy cells as well as cancer cells. Report any side effects. Continue your course of treatment even though you feel ill unless your doctor tells you to stop. In some cases, you may be given additional medicines to help with side effects. Follow all directions for their use. Call your doctor or health care professional for advice if you get a fever, chills or sore throat, or other symptoms of a cold or flu. Do not treat yourself. This drug decreases your body's ability to fight infections. Try to avoid being around people who are sick. This medicine may  increase your risk to bruise or bleed. Call your doctor or health care professional if you notice any unusual bleeding. Be careful brushing and flossing your teeth or using a toothpick because you may get an infection or bleed more easily. If you have any dental work done, tell your dentist you are receiving this medicine. Avoid taking products that contain aspirin, acetaminophen, ibuprofen, naproxen, or ketoprofen unless instructed by your doctor. These medicines may hide a fever. Do not become pregnant while taking this medicine. Women should inform their doctor if they wish to become pregnant or think they might be pregnant. There is a potential for serious side effects to an unborn child. Talk  to your health care professional or pharmacist for more information. Do not breast-feed an infant while taking this medicine. What side effects may I notice from receiving this medicine? Side effects that you should report to your doctor or health care professional as soon as possible:  allergic reactions like skin rash, itching or hives, swelling of the face, lips, or tongue  signs of infection - fever or chills, cough, sore throat, pain or difficulty passing urine  signs of decreased platelets or bleeding - bruising, pinpoint red spots on the skin, black, tarry stools, nosebleeds  signs of decreased red blood cells - unusually weak or tired, fainting spells, lightheadedness  breathing problems  changes in hearing  changes in vision  chest pain  high blood pressure  low blood counts - This drug may decrease the number of white blood cells, red blood cells and platelets. You may be at increased risk for infections and bleeding.  nausea and vomiting  pain, swelling, redness or irritation at the injection site  pain, tingling, numbness in the hands or feet  problems with balance, talking, walking  trouble passing urine or change in the amount of urine Side effects that usually do not require medical attention (report to your doctor or health care professional if they continue or are bothersome):  hair loss  loss of appetite  metallic taste in the mouth or changes in taste This list may not describe all possible side effects. Call your doctor for medical advice about side effects. You may report side effects to FDA at 1-800-FDA-1088. Where should I keep my medicine? This drug is given in a hospital or clinic and will not be stored at home. NOTE: This sheet is a summary. It may not cover all possible information. If you have questions about this medicine, talk to your doctor, pharmacist, or health care provider.  2020 Elsevier/Gold Standard (2007-09-29 14:38:05)

## 2020-05-17 NOTE — Research (Signed)
DCP--001: Understanding disparities and clinical trial accrual  05/17/2020      1:45PM  CONSENT: Met with Sherell during her infusion for 10 minutes to discuss DCP.  WereviewedtheDCP-001 consent form for 10 minutes (protocol version date 12/29/18, [place] Active Date 07/13/19) in full, including the voluntary nature of participation, the project purpose, risks and benefits, and who will see the provided information.Annaverbalizes understanding that this study is a one-time consent for collection of demographic variables and that no patient identifiers are being reported. We also reviewed the [facility] form (dated 08/29/14) including the data to be collected, why the information is being collected, who will see the information, and safety measures to keep information private.An opportunity to ask questions is provided and all questions were answered.Upon completion of review,Annaverbalizes a desire to voluntarily participate in DCP-001. All consent and authorization formsare signed and dated by the patient and signed copieswere providedto her.  DCP-001 WORKSHEET:After informed consentis obtained, the DCP-001 Worksheetis completed to collect ethnicity, language, literacy, education, employment, income and smoking history not available in the electronic medical record.  Mikiah wasthanked again forher time and willingness toparticipate.  Carol Ada, RT(R)(T) Clinical Research Coordinator

## 2020-05-18 ENCOUNTER — Telehealth: Payer: Self-pay | Admitting: Nurse Practitioner

## 2020-05-18 LAB — T4: T4, Total: 6.6 ug/dL (ref 4.5–12.0)

## 2020-05-18 NOTE — Telephone Encounter (Signed)
Scheduled per 11/10 los. Noted to give pt appt calendar on next visit. 

## 2020-05-19 NOTE — Progress Notes (Signed)
[place]   [phone] [phone]   Clinic Follow up Note   Patient Care Team: [name], MD as PCP - General (Family Medicine) Mauro Kaufmann, RN as Oncology Nurse Navigator Rockwell Germany, RN as Oncology Nurse Navigator  Date of Service:  05/24/2020  CHIEF COMPLAINT: F/u of left breast cancer  SUMMARY OF ONCOLOGIC HISTORY: Oncology History Overview Note  Cancer Staging Cancer of central portion of left female breast [name]) Staging form: Breast, AJCC 8th Edition - Clinical: No stage assigned - Unsigned    Cancer of central portion of left female breast ([place])  02/23/2020 Mammogram   Diagnostic Mammogram 02/23/20  IMPRESSION The 2x1x2.6cm irregular mass in teh left breast at 12:00 posiiton middle depth is highly suspicious of malignancy. An [place] is recommended for further evaluation and biopsy planning purposes.   02/23/2020 Breast US   [name] 02/23/20  IMPRESSION 2 adjacent spiculated masses in the left brast at 12:00 position 3 cm from the nipple (2.1x0.9x1.1cm and 1.1x1.4x0.5cm) is suggestive of malignancy.   Multiple abnormal left subpectoral and left axillary nodes measuring 3.3x2.1 cm concerning for metastatic adenopathy.    Left breast skin thickening and edema may be secondary congestive edema due to extensive axillary adenopathy.    03/08/2020 Initial Biopsy   Diagnosis 1.Breast, left, needle core biopsy, 12:00 position, 3cmfn -INVASIVE DUCTAL CARCINOMA -SEE COMMENT  2. Lymph node, needle/core biopsy, left axilla -METASTATIC CARCINOMA INVOLVING A LYMPH NODE  -LYMPHOVASCULAR SPACE INVASION PRESENT   Microscopic Comment  1.Based on the biopsy the carcinoma appears Nottingham Grade 3 or 3 and measures 1 cm in the greatest linear extent.    03/08/2020 Receptors her2   ER- Negative 0% PR - Negative 0% HER2 - Negative  KI 67 - 80%    03/08/2020 Cancer Staging   Staging form: Breast, AJCC 8th Edition - Clinical  stage from 03/08/2020: Stage IIIB (cT2, cN1, cM0, G3, ER-, PR-, HER2-) - Signed by Truitt Merle, MD on 03/10/2020   03/10/2020 Initial Diagnosis   Cancer of central portion of left female breast ([place])   03/16/2020 Breast MRI   IMPRESSION: 1. 8.1 x 7.8 x 6.6 cm biopsy proven invasive ductal carcinoma in the central right breast, involving 3 quadrants. 2. 3.0 x 1.7 x 1.1 cm satellite mass more inferiorly in lower inner quadrant of the left breast, compatible with additional malignancy. 3. Metastatic level 1 and level 2 left axillary lymph nodes. 4. No evidence of malignancy on the right.   03/17/2020 Imaging   IMPRESSION: CT CAP w contrast  1. Diffuse skin thickening in the left breast with left axillary and subpectoral lymphadenopathy, as well as a mildly enlarged left supraclavicular lymph node, which likely represents metastatic lymphadenopathy. No other definite extra nodal metastatic disease noted elsewhere in the chest, abdomen or pelvis. 2. Large mass in the central anatomic pelvis which is of uncertain origin, potentially a large exophytic fibroid or a large solid mass arising from the right ovary. Further evaluation with pelvic ultrasound is strongly recommended.   03/21/2020 Imaging   Bone Scan  IMPRESSION: Apparent arthropathy at L5. No bony metastatic disease is demonstrable on this study. Scattered foci of abnormal uptake in a pelvic mass is of uncertain etiology given absence of calcification in this mass by CT. This mass compresses the urinary bladder. It is possible that some of the increased uptake in this area actually represents physiologic uptake within the bladder.   Kidneys noted in flank positions  bilaterally.     03/22/2020 -  Neo-Adjuvant Chemotherapy   Neoadjuvant Adriamycin and Cytoxan q2weeks for 4 cycles starting 03/22/20-05/03/20 followed by weekly Taxol and Carboplatin for 12 weeks starting 05/17/20   03/24/2020 Imaging   US Pelvis  IMPRESSION: 1. Large  pedunculated lesion directly contiguous with the uterine most suggestive of a large subserosal fibroid which measures up to 8.2 cm. 2. Additional 1 cm probable intramural fibroid in the right anterior uterine body. 3. No other acute or worrisome pelvic abnormality.   04/28/2020 -  Antibody Plan   Added Keytruda q3weeks starting 04/28/20 to complete 1 year of treatment       CURRENT THERAPY:  Neoadjuvant chemo ddACq2weeks for 4 cyclesstarting 03/22/20-05/03/20, followed by weekly carbo/taxolfor 12 weeks starting 05/17/20. -Added Keytruda q3weeks to complete 1 year of treatmenton 04/28/20  INTERVAL HISTORY:  Caitlyn Hendricks is here for a follow up. She presents to the clinic with her mother. She notes her first dose Taxol went well. Her skin rash is still minimal. She used ice bag with infusion and at home. She notes having dull aches in her upper inner right breast and her right shoulder blade area which is intermittent. This will last under 1 minute. She denies any breast changes and mildly tender to touch.    REVIEW OF SYSTEMS:   Constitutional: Denies fevers, chills or abnormal weight loss Eyes: Denies blurriness of vision Ears, nose, mouth, throat, and face: Denies mucositis or sore throat Respiratory: Denies cough, dyspnea or wheezes Cardiovascular: Denies palpitation, chest discomfort or lower extremity swelling Gastrointestinal:  Denies change in bowel habits (+) nausea  Skin: (+) Minimal upper trunk skin rash.  Lymphatics: Denies new lymphadenopathy or easy bruising Neurological:Denies numbness, tingling or new weaknesses Behavioral/Psych: Mood is stable, no new changes  Breast: (+) Right upper inner breast ache and right shoulder blade pain, intermittent  All other systems were reviewed with the patient and are negative.  MEDICAL HISTORY:  Past Medical History:  Diagnosis Date  . Anxiety   . Depression     SURGICAL HISTORY: Past Surgical History:   Procedure Laterality Date  . PORTACATH PLACEMENT Right 03/21/2020   Procedure: INSERTION PORT-A-CATH WITH ULTRASOUND GUIDANCE;  Surgeon: [name], MD;  Location: [facility];  Service: General;  Laterality: Right;    I have reviewed the social history and family history with the patient and they are unchanged from previous note.  ALLERGIES:  is allergic to medroxyprogesterone.  MEDICATIONS:  Current Outpatient Medications  Medication Sig Dispense Refill  . ALPRAZolam (XANAX) 0.5 MG tablet Take 0.5 mg by mouth 3 (three) times daily.    Marland Kitchen ALPRAZolam (XANAX) 1 MG tablet Take 1 mg by mouth 3 (three) times daily as needed for anxiety.     . baclofen (LIORESAL) 10 MG tablet Take 1 tablet (10 mg total) by mouth 2 (two) times daily as needed for muscle spasms. 30 each 0  . citalopram (CELEXA) 20 MG tablet Take 20 mg by mouth daily.    Marland Kitchen dexamethasone (DECADRON) 4 MG tablet Take 2 tablets by mouth daily starting the day after Carboplatin and Cytoxan x 3 days. Take with food. 30 tablet 1  . fluconazole (DIFLUCAN) 150 MG tablet Take 1 tablet for a yeast infection if needed, may repeat in 3 days if needed Use if needed while on antibiotic. 2 tablet 0  . lidocaine-prilocaine (EMLA) cream Apply to affected area once 30 g 3  . methylPREDNISolone (MEDROL DOSEPAK) 4 MG TBPK tablet Take  1 tablet (4 mg total) by mouth taper from 4 doses each day to 1 dose and stop. 1 each 0  . omeprazole (PRILOSEC) 20 MG capsule Take 1 capsule (20 mg total) by mouth 2 (two) times daily before a meal. 60 capsule 2  . ondansetron (ZOFRAN) 8 MG tablet Take 1 tablet (8 mg total) by mouth 2 (two) times daily as needed. Start on the third day after chemotherapy. 30 tablet 2  . prochlorperazine (COMPAZINE) 10 MG tablet Take 1 tablet (10 mg total) by mouth every 6 (six) hours as needed (Nausea or vomiting). 30 tablet 2  . sertraline (ZOLOFT) 50 MG tablet Take by mouth.    . traMADol (ULTRAM) 50 MG tablet Take 1  tablet (50 mg total) by mouth daily as needed for severe pain (headache). 30 tablet 0  . triamcinolone ointment (KENALOG) 0.5 % Apply 1 application topically 2 (two) times daily. 30 g 2  . zolpidem (AMBIEN) 10 MG tablet Take 1 tablet (10 mg total) by mouth at bedtime as needed for sleep. 30 tablet 1   No current facility-administered medications for this visit.   Facility-Administered Medications Ordered in Other Visits  Medication Dose Route Frequency Provider Last Rate Last Admin  . sodium chloride flush (NS) 0.9 % injection 10 mL  10 mL Intravenous PRN Alla Feeling, NP   10 mL at 03/28/20 1104    PHYSICAL EXAMINATION: ECOG PERFORMANCE STATUS: 1 - Symptomatic but completely ambulatory  Vitals:   05/24/20 0912  BP: 117/74  Pulse: 95  Resp: 17  Temp: 98.5 F (36.9 C)  SpO2: 99%   Filed Weights   05/24/20 0912  Weight: 201 lb (91.2 kg)    GENERAL:alert, no distress and comfortable SKIN: skin color, texture, turgor are normal, no rashes or significant lesions EYES: normal, Conjunctiva are pink and non-injected, sclera clear  NECK: supple, thyroid normal size, non-tender, without nodularity LYMPH:  no palpable lymphadenopathy in the cervical, axillary  LUNGS: clear to auscultation and percussion with normal breathing effort HEART: regular rate & rhythm and no murmurs and no lower extremity edema ABDOMEN:abdomen soft, non-tender and normal bowel sounds Musculoskeletal:no cyanosis of digits and no clubbing  NEURO: alert & oriented x 3 with fluent speech, no focal motor/sensory deficits BREAST:(+)No longer palpable left axillary LN (+)Her4x6cm mass in 11-12:00 position of left breastnot palpable. RightBreast exam benign.  LABORATORY DATA:  I have reviewed the data as listed CBC Latest Ref Rng & Units 05/24/2020 05/17/2020 05/03/2020  WBC 4.0 - 10.5 K/uL 5.1 9.4 11.4(H)  Hemoglobin 12.0 - 15.0 g/dL 9.6(L) 10.4(L) 10.3(L)  Hematocrit 36 - 46 % 29.5(L) 32.2(L) 31.9(L)   Platelets 150 - 400 K/uL 329 293 221     CMP Latest Ref Rng & Units 05/24/2020 05/17/2020 05/03/2020  Glucose 70 - 99 mg/dL 90 102(H) 80  BUN 6 - 20 mg/dL 14 14 16   Creatinine 0.44 - 1.00 mg/dL 0.70 0.75 0.70  Sodium 135 - 145 mmol/L 140 140 141  Potassium 3.5 - 5.1 mmol/L 3.9 3.8 3.7  Chloride 98 - 111 mmol/L 105 107 106  CO2 22 - 32 mmol/L 25 26 28   Calcium 8.9 - 10.3 mg/dL 9.1 9.2 9.2  Total Protein 6.5 - 8.1 g/dL 6.6 7.1 6.9  Total Bilirubin 0.3 - 1.2 mg/dL <0.2(L) 0.3 <0.2(L)  Alkaline Phos 38 - 126 U/L 120 155(H) 160(H)  AST 15 - 41 U/L 25 16 21   ALT 0 - 44 U/L 26 17 30  RADIOGRAPHIC STUDIES: I have personally reviewed the radiological images as listed and agreed with the findings in the report. No results found.   ASSESSMENT & PLAN:  Caitlyn Hendricks is a 39 y.o. 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 Her ER/PR/HER2 markers were all negative.CT CAP/Bone scan negative for distant mets.  -To downstage disease and reduce risk of recurrence, I started her on neoadjuvant chemo with ddAC q2weeks for 4 cycles beginning9/15/21-10/27/21. This will befollowed by weekly carbo/taxolfor 12 weeksstarting 11/10/21before proceeding with surgery.  -Based on recently publish [id number] trial data,I added Keytruda q3weeks for 1 year treatment on 04/28/20.  -Her left breast mass remains non-palpable since C2 and left axilla barely palpable s/p C3, she is clinically responding to chemotherapy. -She tolerated first week CT well. She notes recent onset of upper inner  right breast and right shoulder blade pain intermittently. No mass or breast chance on exam today. Will monitor, this may be muscular related.  -Labs reviewed and adequate to proceed with week 2 CT today. Continue weekly.  -Next Keytruda in 2 weeks, continue every 3 weeks.   -She is interested in b/l mastectomy for symmetrical appearance along with reconstruction surgery. I discussed double lumpectomy is possible if she has great response to treatment. I will consult with Dr Donne Hazel about this. Plan for breast MRI after chemo. If needed may do breast US sooner to evaluate her response.  -F/u in 2 weeks  2. Constipation, Nausea, Heartburn -Secondary to chemo -Nausea well controlled on compazine. She did not tolerate zofran due to headaches.  -She has increased constipation with possible hemorrhoids along with pain and blood with wiping. I recommend she increase Miralax each day and use SITS bath after each BM.  -Continue Prilosec.   3. Genetic Testingnegative for pathogenetic mutations with VUS of POLE gene  4. Anxiety/Depression, Social Support  -She is currently on Xanax 74m TID and Celexa 557m This is managed by her PCP. -Sheis willing to f/u with SW for counseling as needed. -She notes she has been stressed this past year and was binge drinking occasionally. She quit drinking 02/09/20 and does not plan to restart. She does not plan to restart smoking or vaping.  -She has very good social supportfrom mother and boyfriend. 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 I offered seeing financial office and advocate if needed.  -She is on low dose Ambien for sleep.   5. Skin rash, secondary to chemo -Was very mild and minimal after first dose but has progressively increased. S/p C3, rash become painful. Did improved after topical steroid, and she did not take oral steroids since she is on Keytruda   -Continue Kenalog cream.  -Skin rash remains mild and minimal  after completing AC. Will continue to monitor.    PLAN: -Labs reviewed and adequate to proceed with week 2 CT today at same dose -Keytruda in 2, 5, 8 weeks  -Lab, flush, and weekly carbo/taxol  -F/u every 2 weeks.  -will send message to Dr. WaDonne Hazelbout her breast surgery and if she needs to see plastic surgeon    No problem-specific Assessment & Plan  notes found for this encounter.   No orders of the defined types were placed in this encounter.  All questions were answered. The patient knows to call the clinic with any problems, questions or concerns. No barriers to learning was detected. The total time spent in the appointment was 30 minutes.     Truitt Merle, MD 05/24/2020   I, Joslyn Devon, am acting as scribe for Truitt Merle, MD.   I have reviewed the above documentation for accuracy and completeness, and I agree with the above.

## 2020-05-24 ENCOUNTER — Inpatient Hospital Stay (HOSPITAL_BASED_OUTPATIENT_CLINIC_OR_DEPARTMENT_OTHER): Payer: 59 | Admitting: Hematology

## 2020-05-24 ENCOUNTER — Inpatient Hospital Stay: Payer: 59

## 2020-05-24 ENCOUNTER — Other Ambulatory Visit: Payer: Self-pay

## 2020-05-24 VITALS — BP 117/74 | HR 95 | Temp 98.5°F | Resp 17 | Ht 64.0 in | Wt 201.0 lb

## 2020-05-24 DIAGNOSIS — C50112 Malignant neoplasm of central portion of left female breast: Secondary | ICD-10-CM | POA: Diagnosis not present

## 2020-05-24 DIAGNOSIS — Z171 Estrogen receptor negative status [ER-]: Secondary | ICD-10-CM

## 2020-05-24 DIAGNOSIS — Z95828 Presence of other vascular implants and grafts: Secondary | ICD-10-CM

## 2020-05-24 DIAGNOSIS — Z5111 Encounter for antineoplastic chemotherapy: Secondary | ICD-10-CM | POA: Diagnosis not present

## 2020-05-24 LAB — CMP (CANCER CENTER ONLY)
ALT: 26 U/L (ref 0–44)
AST: 25 U/L (ref 15–41)
Albumin: 3.3 g/dL — ABNORMAL LOW (ref 3.5–5.0)
Alkaline Phosphatase: 120 U/L (ref 38–126)
Anion gap: 10 (ref 5–15)
BUN: 14 mg/dL (ref 6–20)
CO2: 25 mmol/L (ref 22–32)
Calcium: 9.1 mg/dL (ref 8.9–10.3)
Chloride: 105 mmol/L (ref 98–111)
Creatinine: 0.7 mg/dL (ref 0.44–1.00)
GFR, Estimated: >60 mL/min (ref >60)
Glucose, Bld: 90 mg/dL (ref 70–99)
Potassium: 3.9 mmol/L (ref 3.5–5.1)
Sodium: 140 mmol/L (ref 135–145)
Total Bilirubin: <0.2 mg/dL — ABNORMAL LOW (ref 0.3–1.2)
Total Protein: 6.6 g/dL (ref 6.5–8.1)

## 2020-05-24 LAB — CBC WITH DIFFERENTIAL (CANCER CENTER ONLY)
Abs Immature Granulocytes: 0.06 10*3/uL (ref 0.00–0.07)
Basophils Absolute: 0.1 10*3/uL (ref 0.0–0.1)
Basophils Relative: 1 %
Eosinophils Absolute: 0 10*3/uL (ref 0.0–0.5)
Eosinophils Relative: 1 %
HCT: 29.5 % — ABNORMAL LOW (ref 36.0–46.0)
Hemoglobin: 9.6 g/dL — ABNORMAL LOW (ref 12.0–15.0)
Immature Granulocytes: 1 %
Lymphocytes Relative: 15 %
Lymphs Abs: 0.8 10*3/uL (ref 0.7–4.0)
MCH: 27.9 pg (ref 26.0–34.0)
MCHC: 32.5 g/dL (ref 30.0–36.0)
MCV: 85.8 fL (ref 80.0–100.0)
Monocytes Absolute: 0.6 10*3/uL (ref 0.1–1.0)
Monocytes Relative: 12 %
Neutro Abs: 3.6 10*3/uL (ref 1.7–7.7)
Neutrophils Relative %: 70 %
Platelet Count: 329 10*3/uL (ref 150–400)
RBC: 3.44 MIL/uL — ABNORMAL LOW (ref 3.87–5.11)
RDW: 16.7 % — ABNORMAL HIGH (ref 11.5–15.5)
WBC Count: 5.1 10*3/uL (ref 4.0–10.5)
nRBC: 0 % (ref 0.0–0.2)

## 2020-05-24 LAB — TSH: TSH: 0.74 u[IU]/mL (ref 0.308–3.960)

## 2020-05-24 MED ORDER — PALONOSETRON HCL INJECTION 0.25 MG/5ML
INTRAVENOUS | Status: AC
  Filled 2020-05-24: qty 5

## 2020-05-24 MED ORDER — SODIUM CHLORIDE 0.9% FLUSH
10.0000 mL | INTRAVENOUS | Status: DC | PRN
  Administered 2020-05-24: 10 mL
  Filled 2020-05-24: qty 10

## 2020-05-24 MED ORDER — HEPARIN SOD (PORK) LOCK FLUSH 100 UNIT/ML IV SOLN
500.0000 [IU] | Freq: Once | INTRAVENOUS | Status: AC | PRN
  Administered 2020-05-24: 500 [IU]
  Filled 2020-05-24: qty 5

## 2020-05-24 MED ORDER — SODIUM CHLORIDE 0.9% FLUSH
10.0000 mL | INTRAVENOUS | Status: DC | PRN
  Administered 2020-05-24: 10 mL via INTRAVENOUS
  Filled 2020-05-24: qty 10

## 2020-05-24 MED ORDER — SODIUM CHLORIDE 0.9 % IV SOLN: 223.9500 mg | Freq: Once | INTRAVENOUS | Status: DC

## 2020-05-24 MED ORDER — SODIUM CHLORIDE 0.9 % IV SOLN
80.0000 mg/m2 | Freq: Once | INTRAVENOUS | Status: AC
  Administered 2020-05-24: 156 mg via INTRAVENOUS
  Filled 2020-05-24: qty 26

## 2020-05-24 MED ORDER — METHYLPREDNISOLONE SODIUM SUCC 40 MG IJ SOLR
20.0000 mg | Freq: Every day | INTRAMUSCULAR | Status: DC
  Administered 2020-05-24: 20 mg via INTRAVENOUS

## 2020-05-24 MED ORDER — METHYLPREDNISOLONE SODIUM SUCC 40 MG IJ SOLR
INTRAMUSCULAR | Status: AC
  Filled 2020-05-24: qty 1

## 2020-05-24 MED ORDER — DIPHENHYDRAMINE HCL 50 MG/ML IJ SOLN
50.0000 mg | Freq: Once | INTRAMUSCULAR | Status: AC
  Administered 2020-05-24: 50 mg via INTRAVENOUS

## 2020-05-24 MED ORDER — PALONOSETRON HCL INJECTION 0.25 MG/5ML
0.2500 mg | Freq: Once | INTRAVENOUS | Status: AC
  Administered 2020-05-24: 0.25 mg via INTRAVENOUS

## 2020-05-24 MED ORDER — SODIUM CHLORIDE 0.9 % IV SOLN
220.0000 mg | Freq: Once | INTRAVENOUS | Status: AC
  Administered 2020-05-24: 220 mg via INTRAVENOUS
  Filled 2020-05-24: qty 22

## 2020-05-24 MED ORDER — FAMOTIDINE IN NACL 20-0.9 MG/50ML-% IV SOLN
20.0000 mg | Freq: Once | INTRAVENOUS | Status: AC
  Administered 2020-05-24: 20 mg via INTRAVENOUS

## 2020-05-24 MED ORDER — FAMOTIDINE IN NACL 20-0.9 MG/50ML-% IV SOLN
INTRAVENOUS | Status: AC
  Filled 2020-05-24: qty 50

## 2020-05-24 MED ORDER — SODIUM CHLORIDE 0.9 % IV SOLN: 200.0000 mg | Freq: Once | INTRAVENOUS | Status: DC

## 2020-05-24 MED ORDER — SODIUM CHLORIDE 0.9 % IV SOLN
Freq: Once | INTRAVENOUS | Status: AC
  Filled 2020-05-24: qty 250

## 2020-05-24 MED ORDER — DIPHENHYDRAMINE HCL 50 MG/ML IJ SOLN
INTRAMUSCULAR | Status: AC
  Filled 2020-05-24: qty 1

## 2020-05-24 NOTE — Patient Instructions (Signed)

## 2020-05-24 NOTE — Progress Notes (Signed)
Added aloxi to careplan per VO and moved all premeds to one tab (solu-medrol 20 mg was by itself) to insure none are missed from being in multiple locations.

## 2020-05-24 NOTE — Patient Instructions (Signed)
[facility] Discharge Instructions for Patients Receiving Chemotherapy  Today you received the following chemotherapy agents taxol; Carboplatin  To help prevent nausea and vomiting after your treatment, we encourage you to take your nausea medication as directed   If you develop nausea and vomiting that is not controlled by your nausea medication, call the clinic.   BELOW ARE SYMPTOMS THAT SHOULD BE REPORTED IMMEDIATELY:  *FEVER GREATER THAN 100.5 F  *CHILLS WITH OR WITHOUT FEVER  NAUSEA AND VOMITING THAT IS NOT CONTROLLED WITH YOUR NAUSEA MEDICATION  *UNUSUAL SHORTNESS OF BREATH  *UNUSUAL BRUISING OR BLEEDING  TENDERNESS IN MOUTH AND THROAT WITH OR WITHOUT PRESENCE OF ULCERS  *URINARY PROBLEMS  *BOWEL PROBLEMS  UNUSUAL RASH Items with * indicate a potential emergency and should be followed up as soon as possible.  Feel free to call the clinic should you have any questions or concerns. The clinic phone number is [phone].  Please show the CHEMO ALERT CARD at check-in to the Emergency Department and triage nurse.   

## 2020-05-25 ENCOUNTER — Other Ambulatory Visit: Payer: Self-pay | Admitting: Nurse Practitioner

## 2020-05-25 ENCOUNTER — Encounter: Payer: Self-pay | Admitting: Hematology

## 2020-05-25 ENCOUNTER — Telehealth: Payer: Self-pay | Admitting: Hematology

## 2020-05-25 LAB — T4: T4, Total: 4.5 ug/dL (ref 4.5–12.0)

## 2020-05-25 NOTE — Telephone Encounter (Signed)
Scheduled per 11/17 los. Noted to give pt appt calendar.

## 2020-05-25 NOTE — Telephone Encounter (Signed)
Does this need to be refilled it says you filled it 1 week ago ??

## 2020-05-30 NOTE — Progress Notes (Signed)
[name]   [phone] [phone]   Clinic Follow up Note   Patient Care Team: [name], MD as PCP - General (Family Medicine) Mauro Kaufmann, RN as Oncology Nurse Navigator Rockwell Germany, RN as Oncology Nurse Navigator [name], MD as Consulting Physician (General Surgery) Truitt Merle, MD as Consulting Physician (Hematology)  Date of Service:  06/07/2020  CHIEF COMPLAINT: F/u of left breast cancer  SUMMARY OF ONCOLOGIC HISTORY: Oncology History Overview Note  Cancer Staging Cancer of central portion of left female breast [facility]) Staging form: Breast, AJCC 8th Edition - Clinical: No stage assigned - Unsigned    Cancer of central portion of left female breast ([place])  02/23/2020 Mammogram   Diagnostic Mammogram 02/23/20  IMPRESSION The 2x1x2.6cm irregular mass in teh left breast at 12:00 posiiton middle depth is highly suspicious of malignancy. An [place] is recommended for further evaluation and biopsy planning purposes.   02/23/2020 Breast US   [name] 02/23/20  IMPRESSION 2 adjacent spiculated masses in the left brast at 12:00 position 3 cm from the nipple (2.1x0.9x1.1cm and 1.1x1.4x0.5cm) is suggestive of malignancy.   Multiple abnormal left subpectoral and left axillary nodes measuring 3.3x2.1 cm concerning for metastatic adenopathy.    Left breast skin thickening and edema may be secondary congestive edema due to extensive axillary adenopathy.    03/08/2020 Initial Biopsy   Diagnosis 1.Breast, left, needle core biopsy, 12:00 position, 3cmfn -INVASIVE DUCTAL CARCINOMA -SEE COMMENT  2. Lymph node, needle/core biopsy, left axilla -METASTATIC CARCINOMA INVOLVING A LYMPH NODE  -LYMPHOVASCULAR SPACE INVASION PRESENT   Microscopic Comment  1.Based on the biopsy the carcinoma appears Nottingham Grade 3 or 3 and measures 1 cm in the greatest linear extent.    03/08/2020 Receptors her2   ER- Negative 0% PR - Negative  0% HER2 - Negative  KI 67 - 80%    03/08/2020 Cancer Staging   Staging form: Breast, AJCC 8th Edition - Clinical stage from 03/08/2020: Stage IIIB (cT2, cN1, cM0, G3, ER-, PR-, HER2-) - Signed by Truitt Merle, MD on 03/10/2020   03/10/2020 Initial Diagnosis   Cancer of central portion of left female breast ([place])   03/16/2020 Breast MRI   IMPRESSION: 1. 8.1 x 7.8 x 6.6 cm biopsy proven invasive ductal carcinoma in the central right breast, involving 3 quadrants. 2. 3.0 x 1.7 x 1.1 cm satellite mass more inferiorly in lower inner quadrant of the left breast, compatible with additional malignancy. 3. Metastatic level 1 and level 2 left axillary lymph nodes. 4. No evidence of malignancy on the right.   03/17/2020 Imaging   IMPRESSION: CT CAP w contrast  1. Diffuse skin thickening in the left breast with left axillary and subpectoral lymphadenopathy, as well as a mildly enlarged left supraclavicular lymph node, which likely represents metastatic lymphadenopathy. No other definite extra nodal metastatic disease noted elsewhere in the chest, abdomen or pelvis. 2. Large mass in the central anatomic pelvis which is of uncertain origin, potentially a large exophytic fibroid or a large solid mass arising from the right ovary. Further evaluation with pelvic ultrasound is strongly recommended.   03/21/2020 Imaging   Bone Scan  IMPRESSION: Apparent arthropathy at L5. No bony metastatic disease is demonstrable on this study. Scattered foci of abnormal uptake in a pelvic mass is of uncertain etiology given absence of calcification in this mass by CT. This mass compresses the urinary bladder. It is possible that some of the increased uptake in this  area actually represents physiologic uptake within the bladder.   Kidneys noted in flank positions bilaterally.     03/22/2020 -  Neo-Adjuvant Chemotherapy   Neoadjuvant Adriamycin and Cytoxan q2weeks for 4 cycles starting 03/22/20-05/03/20 followed by  weekly Taxol and Carboplatin for 12 weeks starting 05/17/20   03/24/2020 Imaging   US Pelvis  IMPRESSION: 1. Large pedunculated lesion directly contiguous with the uterine most suggestive of a large subserosal fibroid which measures up to 8.2 cm. 2. Additional 1 cm probable intramural fibroid in the right anterior uterine body. 3. No other acute or worrisome pelvic abnormality.   04/28/2020 -  Antibody Plan   Added Keytruda q3weeks starting 04/28/20 to complete 1 year of treatment       CURRENT THERAPY:  Neoadjuvant chemo ddACq2weeks for 4 cyclesstarting 03/22/20-05/03/20, followed by weekly carbo/taxolfor 12 weeksstarting 05/17/20. -Added Keytruda q3weeks to complete 1 year of treatmenton10/22/21  INTERVAL HISTORY:  Caitlyn Hendricks is here for a follow up. She presents to the clinic with her mother. She notes in the last 2 weeks her hot flashes have increased and effect her sleep and activity level. She feels is lasts all day long. She notes the first day she started chemo she had a period and mildly had a period 1 month ago. She notes occasional diarrhea. She denies neuropathy. She notes sinus pressure around her right eye which leads to blurred eye only in right eye and more teary. She plans to be seen by ophthalmologist next month. She notes aches and pains on day 4 which is managed with ibuprofen and muscle relaxer.     REVIEW OF SYSTEMS:   Constitutional: Denies fevers, chills or abnormal weight loss (+) increased hot flashes  Eyes: Denies blurriness of vision Ears, nose, mouth, throat, and face: Denies mucositis or sore throat Respiratory: Denies cough, dyspnea or wheezes Cardiovascular: Denies palpitation, chest discomfort or lower extremity swelling Gastrointestinal:  Denies nausea, heartburn or change in bowel habits Skin: Denies abnormal skin rashes Lymphatics: Denies new lymphadenopathy or easy bruising Neurological:Denies numbness, tingling or new  weaknesses Behavioral/Psych: Mood is stable, no new changes  All other systems were reviewed with the patient and are negative.  MEDICAL HISTORY:  Past Medical History:  Diagnosis Date   Anxiety    Depression     SURGICAL HISTORY: Past Surgical History:  Procedure Laterality Date   PORTACATH PLACEMENT Right 03/21/2020   Procedure: INSERTION PORT-A-CATH WITH ULTRASOUND GUIDANCE;  Surgeon: [name], MD;  Location: [facility];  Service: General;  Laterality: Right;    I have reviewed the social history and family history with the patient and they are unchanged from previous note.  ALLERGIES:  is allergic to medroxyprogesterone.  MEDICATIONS:  Current Outpatient Medications  Medication Sig Dispense Refill   ALPRAZolam (XANAX) 0.5 MG tablet Take 0.5 mg by mouth 3 (three) times daily.     ALPRAZolam (XANAX) 1 MG tablet Take 1 mg by mouth 3 (three) times daily as needed for anxiety.      baclofen (LIORESAL) 10 MG tablet Take 1 tablet (10 mg total) by mouth 2 (two) times daily as needed for muscle spasms. 30 each 0   citalopram (CELEXA) 20 MG tablet Take 20 mg by mouth daily.     dexamethasone (DECADRON) 4 MG tablet Take 2 tablets by mouth daily starting the day after Carboplatin and Cytoxan x 3 days. Take with food. 30 tablet 1   fluconazole (DIFLUCAN) 150 MG tablet Take 1 tablet for a yeast infection  if needed, may repeat in 3 days if needed Use if needed while on antibiotic. 2 tablet 0   gabapentin (NEURONTIN) 100 MG capsule Take 1 capsule (100 mg total) by mouth 3 (three) times daily. May increase to 360m three times daily as need in a few weeks if tolerates well 90 capsule 0   lidocaine-prilocaine (EMLA) cream Apply to affected area once 30 g 3   methylPREDNISolone (MEDROL DOSEPAK) 4 MG TBPK tablet Take 1 tablet (4 mg total) by mouth taper from 4 doses each day to 1 dose and stop. 1 each 0   omeprazole (PRILOSEC) 20 MG capsule Take 1 capsule (20 mg  total) by mouth 2 (two) times daily before a meal. 60 capsule 2   ondansetron (ZOFRAN) 8 MG tablet Take 1 tablet (8 mg total) by mouth 2 (two) times daily as needed. Start on the third day after chemotherapy. 30 tablet 2   prochlorperazine (COMPAZINE) 10 MG tablet Take 1 tablet (10 mg total) by mouth every 6 (six) hours as needed (Nausea or vomiting). 30 tablet 2   sertraline (ZOLOFT) 50 MG tablet Take by mouth.     traMADol (ULTRAM) 50 MG tablet Take 1 tablet (50 mg total) by mouth daily as needed for severe pain (headache). 30 tablet 0   triamcinolone ointment (KENALOG) 0.5 % Apply 1 application topically 2 (two) times daily. 30 g 2   zolpidem (AMBIEN) 10 MG tablet Take 1 tablet (10 mg total) by mouth at bedtime as needed for sleep. 30 tablet 1   No current facility-administered medications for this visit.   Facility-Administered Medications Ordered in Other Visits  Medication Dose Route Frequency Provider Last Rate Last Admin   sodium chloride flush (NS) 0.9 % injection 10 mL  10 mL Intravenous PRN BAlla Feeling NP   10 mL at 03/28/20 1104   sodium chloride flush (NS) 0.9 % injection 10 mL  10 mL Intracatheter PRN FTruitt Merle MD   10 mL at 06/07/20 1309    PHYSICAL EXAMINATION: ECOG PERFORMANCE STATUS: 1 - Symptomatic but completely ambulatory  Vitals:   06/07/20 0916  BP: 118/88  Pulse: 95  Resp: 18  Temp: (!) 97.2 F (36.2 C)   Filed Weights   06/07/20 0916  Weight: 200 lb 3.2 oz (90.8 kg)    GENERAL:alert, no distress and comfortable SKIN: skin color, texture, turgor are normal, no rashes or significant lesions EYES: normal, Conjunctiva are pink and non-injected, sclera clear  NECK: supple, thyroid normal size, non-tender, without nodularity LYMPH:  no palpable lymphadenopathy in the cervical, axillary  LUNGS: clear to auscultation and percussion with normal breathing effort HEART: regular rate & rhythm and no murmurs and no lower extremity edema ABDOMEN:abdomen  soft, non-tender and normal bowel sounds Musculoskeletal:no cyanosis of digits and no clubbing  NEURO: alert & oriented x 3 with fluent speech (+) Minimal decreases sensory deficits in right arm  LABORATORY DATA:  I have reviewed the data as listed CBC Latest Ref Rng & Units 06/07/2020 05/31/2020 05/24/2020  WBC 4.0 - 10.5 K/uL 4.7 4.2 5.1  Hemoglobin 12.0 - 15.0 g/dL 10.3(L) 9.8(L) 9.6(L)  Hematocrit 36 - 46 % 32.1(L) 31.1(L) 29.5(L)  Platelets 150 - 400 K/uL 296 328 329     CMP Latest Ref Rng & Units 06/07/2020 05/31/2020 05/24/2020  Glucose 70 - 99 mg/dL 90 86 90  BUN 6 - 20 mg/dL 13 15 14   Creatinine 0.44 - 1.00 mg/dL 0.69 0.72 0.70  Sodium 135 - 145  mmol/L 138 139 140  Potassium 3.5 - 5.1 mmol/L 4.3 4.1 3.9  Chloride 98 - 111 mmol/L 106 107 105  CO2 22 - 32 mmol/L 26 25 25   Calcium 8.9 - 10.3 mg/dL 9.6 9.3 9.1  Total Protein 6.5 - 8.1 g/dL 7.1 6.9 6.6  Total Bilirubin 0.3 - 1.2 mg/dL 0.2(L) 0.3 <0.2(L)  Alkaline Phos 38 - 126 U/L 122 114 120  AST 15 - 41 U/L 32 46(H) 25  ALT 0 - 44 U/L 52(H) 64(H) 26      RADIOGRAPHIC STUDIES: I have personally reviewed the radiological images as listed and agreed with the findings in the report. No results found.   ASSESSMENT & PLAN:  Caitlyn Hendricks is a 39 y.o. 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 Her 03/08/20 biopsy showed she has grade III invasive ductal carcinoma of left breast metastatic to her left axillary LN. Her ER/PR/HER2 markers were all negative.CT CAP/Bone scan negative for distant mets.  -Surgery is the only curative option. She has consulted with Dr. Donne Hazel about surgery and left mastectomy was offered after  neoadjuvant chemo. She is interested in b/l mastectomy and reconstruction for symmetry.  -To downstage disease and reduce risk of recurrence, I started her on neoadjuvant chemo with ddAC q2weeks for 4 cycles beginning9/15/21-10/27/21. This wasfollowed by weekly carbo/taxolfor 12 weeksstarting 11/10/21before proceeding with surgery. -Based onrecently publish [id number] trial data,I added Keytruda q3weeks for 1 year treatment on 04/28/20. -Her left breast mass remains non-palpable since C2 and left axilla barely palpable s/p C3,she is clinically responding to chemotherapy. Plan breast MRI after last dose chemo before proceeding with surgery.  -S/p week 3 CT she is doing well. She has occasional diarrhea and full body aches on day 4. She also has increased hot flashes. I reviewed management with her.  -Labs reviewed and adequate to proceed with week 4 CT and Kadcyla today.  -Continue CT weekly and Kadcyla q3weeks.  -F/u in 2 weeks    2. Constipation, Nausea, Heartburn -Secondary to chemo -Nausea well controlled on compazine. She did not tolerate zofran due to headaches.  -She has increased constipation which she uses Miralax and use SITS bath after each BM.  -Continue Prilosec.  -She has occasional diarrhea from current chemo, manageable.   3. Genetic Testingnegative for pathogenetic mutations with VUS of POLE gene  4. Anxiety/Depression, Social Support  -She is currently on Xanax 1m TID and Celexa 45m This is managed by her PCP. -Sheis willing to f/u with SW for counseling as needed. -She notes she has been stressed this past year and was binge drinking occasionally. She quit drinking 02/09/20 and does not plan to restart. She does not plan to restart smoking or vaping.  -She has very good social supportfrom mother and boyfriend. 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 I offered seeing financial office and advocate if needed.  -She is on low dose  Ambien for sleep.   5. Skin rash, secondary to chemo -Was very mild and minimal after first dose but has progressively increased. S/p C3, rash become painful. Did improved aftertopical steroid, and she did not take oral steroids since she is on Keytruda -Continue Kenalog cream.  -[facility]  and into [facility]. Will continue to monitor.   6. Hot flashes, Full body aches  -I discussed chemo will put her in perimenopause, so symptoms of hot flashes can happen. Last full period with start of chemo and mild period last month.  -Her hot flashes have significantly increased in the last 2 weeks and nearly constant impacting her sleep and daily active.  -She is on Celexa 38m currently. I discussed adding Gabapentin 1049m She can start at 1002mt night and slowly titrate up to TID if indicated and tolerable.  -She has full body aches secondary to Taxol only on day 4. She uses ibuprofen and muscle relaxer.    PLAN: -I called in Gabapentin 100m44mday. She will titrate dose for hot flushes and body aches after chemo  -Send plastic surgeon referral  -Labs reviewed and adequate to proceed with week 4 CT and Keytruda today at same dose -Keytruda in3, 6, 9 weeks  -Lab, flush, andweekly carbo/taxol  -F/u every 2 weeks.    No problem-specific Assessment & Plan notes found for this encounter.   Orders Placed This Encounter  Procedures   Ambulatory referral to Plastic Surgery    Referral Priority:   Routine    Referral Type:   Surgical    Referral Reason:   Specialty Services Required    Requested Specialty:   Plastic Surgery    Number of Visits Requested:   1   All questions were answered. The patient knows to call the clinic with any problems, questions or concerns. No barriers to learning was detected. The total time spent in the appointment was 30 minutes.     Momin Misko Truitt Merle 06/07/2020   I, AmoyJoslyn Devon acting as scribe for Irisha Grandmaison Truitt Merle.   I have  reviewed the above documentation for accuracy and completeness, and I agree with the above.

## 2020-05-31 ENCOUNTER — Other Ambulatory Visit: Payer: Self-pay

## 2020-05-31 ENCOUNTER — Inpatient Hospital Stay: Payer: 59

## 2020-05-31 VITALS — BP 126/82 | HR 99 | Temp 98.5°F | Resp 16

## 2020-05-31 DIAGNOSIS — Z23 Encounter for immunization: Secondary | ICD-10-CM

## 2020-05-31 DIAGNOSIS — C50112 Malignant neoplasm of central portion of left female breast: Secondary | ICD-10-CM

## 2020-05-31 DIAGNOSIS — Z171 Estrogen receptor negative status [ER-]: Secondary | ICD-10-CM

## 2020-05-31 DIAGNOSIS — Z5111 Encounter for antineoplastic chemotherapy: Secondary | ICD-10-CM | POA: Diagnosis not present

## 2020-05-31 DIAGNOSIS — Z95828 Presence of other vascular implants and grafts: Secondary | ICD-10-CM | POA: Insufficient documentation

## 2020-05-31 LAB — CBC WITH DIFFERENTIAL (CANCER CENTER ONLY)
Abs Immature Granulocytes: 0.02 10*3/uL (ref 0.00–0.07)
Basophils Absolute: 0 10*3/uL (ref 0.0–0.1)
Basophils Relative: 1 %
Eosinophils Absolute: 0 10*3/uL (ref 0.0–0.5)
Eosinophils Relative: 1 %
HCT: 31.1 % — ABNORMAL LOW (ref 36.0–46.0)
Hemoglobin: 9.8 g/dL — ABNORMAL LOW (ref 12.0–15.0)
Immature Granulocytes: 1 %
Lymphocytes Relative: 17 %
Lymphs Abs: 0.7 10*3/uL (ref 0.7–4.0)
MCH: 27.5 pg (ref 26.0–34.0)
MCHC: 31.5 g/dL (ref 30.0–36.0)
MCV: 87.4 fL (ref 80.0–100.0)
Monocytes Absolute: 0.6 10*3/uL (ref 0.1–1.0)
Monocytes Relative: 13 %
Neutro Abs: 2.8 10*3/uL (ref 1.7–7.7)
Neutrophils Relative %: 67 %
Platelet Count: 328 10*3/uL (ref 150–400)
RBC: 3.56 MIL/uL — ABNORMAL LOW (ref 3.87–5.11)
RDW: 17.8 % — ABNORMAL HIGH (ref 11.5–15.5)
WBC Count: 4.2 10*3/uL (ref 4.0–10.5)
nRBC: 0 % (ref 0.0–0.2)

## 2020-05-31 LAB — CMP (CANCER CENTER ONLY)
ALT: 64 U/L — ABNORMAL HIGH (ref 0–44)
AST: 46 U/L — ABNORMAL HIGH (ref 15–41)
Albumin: 3.5 g/dL (ref 3.5–5.0)
Alkaline Phosphatase: 114 U/L (ref 38–126)
Anion gap: 7 (ref 5–15)
BUN: 15 mg/dL (ref 6–20)
CO2: 25 mmol/L (ref 22–32)
Calcium: 9.3 mg/dL (ref 8.9–10.3)
Chloride: 107 mmol/L (ref 98–111)
Creatinine: 0.72 mg/dL (ref 0.44–1.00)
GFR, Estimated: >60 mL/min (ref >60)
Glucose, Bld: 86 mg/dL (ref 70–99)
Potassium: 4.1 mmol/L (ref 3.5–5.1)
Sodium: 139 mmol/L (ref 135–145)
Total Bilirubin: 0.3 mg/dL (ref 0.3–1.2)
Total Protein: 6.9 g/dL (ref 6.5–8.1)

## 2020-05-31 LAB — TSH: TSH: 1.417 u[IU]/mL (ref 0.308–3.960)

## 2020-05-31 MED ORDER — SODIUM CHLORIDE 0.9% FLUSH
10.0000 mL | INTRAVENOUS | Status: DC | PRN
  Administered 2020-05-31: 10 mL
  Filled 2020-05-31: qty 10

## 2020-05-31 MED ORDER — METHYLPREDNISOLONE SODIUM SUCC 40 MG IJ SOLR
20.0000 mg | Freq: Once | INTRAMUSCULAR | Status: AC
  Administered 2020-05-31: 20 mg via INTRAVENOUS

## 2020-05-31 MED ORDER — SODIUM CHLORIDE 0.9% FLUSH
10.0000 mL | Freq: Once | INTRAVENOUS | Status: AC
  Administered 2020-05-31: 10 mL
  Filled 2020-05-31: qty 10

## 2020-05-31 MED ORDER — PALONOSETRON HCL INJECTION 0.25 MG/5ML
INTRAVENOUS | Status: AC
  Filled 2020-05-31: qty 5

## 2020-05-31 MED ORDER — HEPARIN SOD (PORK) LOCK FLUSH 100 UNIT/ML IV SOLN
500.0000 [IU] | Freq: Once | INTRAVENOUS | Status: AC | PRN
  Administered 2020-05-31: 500 [IU]
  Filled 2020-05-31: qty 5

## 2020-05-31 MED ORDER — FAMOTIDINE IN NACL 20-0.9 MG/50ML-% IV SOLN
20.0000 mg | Freq: Once | INTRAVENOUS | Status: AC
  Administered 2020-05-31: 20 mg via INTRAVENOUS

## 2020-05-31 MED ORDER — FAMOTIDINE IN NACL 20-0.9 MG/50ML-% IV SOLN
INTRAVENOUS | Status: AC
  Filled 2020-05-31: qty 50

## 2020-05-31 MED ORDER — DIPHENHYDRAMINE HCL 50 MG/ML IJ SOLN
INTRAMUSCULAR | Status: AC
  Filled 2020-05-31: qty 1

## 2020-05-31 MED ORDER — SODIUM CHLORIDE 0.9 % IV SOLN
80.0000 mg/m2 | Freq: Once | INTRAVENOUS | Status: AC
  Administered 2020-05-31: 156 mg via INTRAVENOUS
  Filled 2020-05-31: qty 26

## 2020-05-31 MED ORDER — DIPHENHYDRAMINE HCL 50 MG/ML IJ SOLN
50.0000 mg | Freq: Once | INTRAMUSCULAR | Status: AC
  Administered 2020-05-31: 50 mg via INTRAVENOUS

## 2020-05-31 MED ORDER — INFLUENZA VAC SPLIT QUAD 0.5 ML IM SUSY
0.5000 mL | PREFILLED_SYRINGE | Freq: Once | INTRAMUSCULAR | Status: AC
  Administered 2020-05-31: 0.5 mL via INTRAMUSCULAR

## 2020-05-31 MED ORDER — SODIUM CHLORIDE 0.9 % IV SOLN
Freq: Once | INTRAVENOUS | Status: AC
  Filled 2020-05-31: qty 250

## 2020-05-31 MED ORDER — INFLUENZA VAC SPLIT QUAD 0.5 ML IM SUSY
PREFILLED_SYRINGE | INTRAMUSCULAR | Status: AC
  Filled 2020-05-31: qty 0.5

## 2020-05-31 MED ORDER — METHYLPREDNISOLONE SODIUM SUCC 40 MG IJ SOLR
INTRAMUSCULAR | Status: AC
  Filled 2020-05-31: qty 1

## 2020-05-31 MED ORDER — PALONOSETRON HCL INJECTION 0.25 MG/5ML
0.2500 mg | Freq: Once | INTRAVENOUS | Status: AC
  Administered 2020-05-31: 0.25 mg via INTRAVENOUS

## 2020-05-31 MED ORDER — SODIUM CHLORIDE 0.9 % IV SOLN
223.9500 mg | Freq: Once | INTRAVENOUS | Status: AC
  Administered 2020-05-31: 220 mg via INTRAVENOUS
  Filled 2020-05-31: qty 22

## 2020-05-31 NOTE — Patient Instructions (Signed)
[facility] Discharge Instructions for Patients Receiving Chemotherapy  Today you received the following chemotherapy agents: paclitaxel and carboplatin.  To help prevent nausea and vomiting after your treatment, we encourage you to take your nausea medication as directed.   If you develop nausea and vomiting that is not controlled by your nausea medication, call the clinic.   BELOW ARE SYMPTOMS THAT SHOULD BE REPORTED IMMEDIATELY:  *FEVER GREATER THAN 100.5 F  *CHILLS WITH OR WITHOUT FEVER  NAUSEA AND VOMITING THAT IS NOT CONTROLLED WITH YOUR NAUSEA MEDICATION  *UNUSUAL SHORTNESS OF BREATH  *UNUSUAL BRUISING OR BLEEDING  TENDERNESS IN MOUTH AND THROAT WITH OR WITHOUT PRESENCE OF ULCERS  *URINARY PROBLEMS  *BOWEL PROBLEMS  UNUSUAL RASH Items with * indicate a potential emergency and should be followed up as soon as possible.  Feel free to call the clinic should you have any questions or concerns. The clinic phone number is [phone].  Please show the CHEMO ALERT CARD at check-in to the Emergency Department and triage nurse.   

## 2020-06-01 LAB — T4: T4, Total: 5.6 ug/dL (ref 4.5–12.0)

## 2020-06-07 ENCOUNTER — Other Ambulatory Visit: Payer: Self-pay

## 2020-06-07 ENCOUNTER — Inpatient Hospital Stay: Payer: 59

## 2020-06-07 ENCOUNTER — Inpatient Hospital Stay: Payer: 59 | Attending: Hematology

## 2020-06-07 ENCOUNTER — Inpatient Hospital Stay (HOSPITAL_BASED_OUTPATIENT_CLINIC_OR_DEPARTMENT_OTHER): Payer: 59 | Admitting: Hematology

## 2020-06-07 ENCOUNTER — Encounter: Payer: Self-pay | Admitting: Hematology

## 2020-06-07 VITALS — BP 118/88 | HR 95 | Temp 97.2°F | Resp 18 | Ht 64.0 in | Wt 200.2 lb

## 2020-06-07 DIAGNOSIS — Z79899 Other long term (current) drug therapy: Secondary | ICD-10-CM | POA: Diagnosis not present

## 2020-06-07 DIAGNOSIS — R12 Heartburn: Secondary | ICD-10-CM | POA: Insufficient documentation

## 2020-06-07 DIAGNOSIS — R110 Nausea: Secondary | ICD-10-CM | POA: Diagnosis not present

## 2020-06-07 DIAGNOSIS — F418 Other specified anxiety disorders: Secondary | ICD-10-CM | POA: Diagnosis not present

## 2020-06-07 DIAGNOSIS — Z171 Estrogen receptor negative status [ER-]: Secondary | ICD-10-CM

## 2020-06-07 DIAGNOSIS — C50112 Malignant neoplasm of central portion of left female breast: Secondary | ICD-10-CM

## 2020-06-07 DIAGNOSIS — R634 Abnormal weight loss: Secondary | ICD-10-CM | POA: Diagnosis not present

## 2020-06-07 DIAGNOSIS — C50912 Malignant neoplasm of unspecified site of left female breast: Secondary | ICD-10-CM | POA: Diagnosis not present

## 2020-06-07 DIAGNOSIS — Z5111 Encounter for antineoplastic chemotherapy: Secondary | ICD-10-CM | POA: Insufficient documentation

## 2020-06-07 DIAGNOSIS — R232 Flushing: Secondary | ICD-10-CM | POA: Insufficient documentation

## 2020-06-07 DIAGNOSIS — T451X5A Adverse effect of antineoplastic and immunosuppressive drugs, initial encounter: Secondary | ICD-10-CM | POA: Insufficient documentation

## 2020-06-07 DIAGNOSIS — R609 Edema, unspecified: Secondary | ICD-10-CM | POA: Insufficient documentation

## 2020-06-07 DIAGNOSIS — R21 Rash and other nonspecific skin eruption: Secondary | ICD-10-CM | POA: Diagnosis not present

## 2020-06-07 DIAGNOSIS — K5900 Constipation, unspecified: Secondary | ICD-10-CM | POA: Diagnosis not present

## 2020-06-07 DIAGNOSIS — R197 Diarrhea, unspecified: Secondary | ICD-10-CM | POA: Diagnosis not present

## 2020-06-07 LAB — CBC WITH DIFFERENTIAL (CANCER CENTER ONLY)
Abs Immature Granulocytes: 0.05 10*3/uL (ref 0.00–0.07)
Basophils Absolute: 0 10*3/uL (ref 0.0–0.1)
Basophils Relative: 1 %
Eosinophils Absolute: 0 10*3/uL (ref 0.0–0.5)
Eosinophils Relative: 1 %
HCT: 32.1 % — ABNORMAL LOW (ref 36.0–46.0)
Hemoglobin: 10.3 g/dL — ABNORMAL LOW (ref 12.0–15.0)
Immature Granulocytes: 1 %
Lymphocytes Relative: 16 %
Lymphs Abs: 0.7 10*3/uL (ref 0.7–4.0)
MCH: 28.3 pg (ref 26.0–34.0)
MCHC: 32.1 g/dL (ref 30.0–36.0)
MCV: 88.2 fL (ref 80.0–100.0)
Monocytes Absolute: 0.4 10*3/uL (ref 0.1–1.0)
Monocytes Relative: 9 %
Neutro Abs: 3.4 10*3/uL (ref 1.7–7.7)
Neutrophils Relative %: 72 %
Platelet Count: 296 10*3/uL (ref 150–400)
RBC: 3.64 MIL/uL — ABNORMAL LOW (ref 3.87–5.11)
RDW: 17.8 % — ABNORMAL HIGH (ref 11.5–15.5)
WBC Count: 4.7 10*3/uL (ref 4.0–10.5)
nRBC: 0 % (ref 0.0–0.2)

## 2020-06-07 LAB — CMP (CANCER CENTER ONLY)
ALT: 52 U/L — ABNORMAL HIGH (ref 0–44)
AST: 32 U/L (ref 15–41)
Albumin: 3.7 g/dL (ref 3.5–5.0)
Alkaline Phosphatase: 122 U/L (ref 38–126)
Anion gap: 6 (ref 5–15)
BUN: 13 mg/dL (ref 6–20)
CO2: 26 mmol/L (ref 22–32)
Calcium: 9.6 mg/dL (ref 8.9–10.3)
Chloride: 106 mmol/L (ref 98–111)
Creatinine: 0.69 mg/dL (ref 0.44–1.00)
GFR, Estimated: >60 mL/min (ref >60)
Glucose, Bld: 90 mg/dL (ref 70–99)
Potassium: 4.3 mmol/L (ref 3.5–5.1)
Sodium: 138 mmol/L (ref 135–145)
Total Bilirubin: 0.2 mg/dL — ABNORMAL LOW (ref 0.3–1.2)
Total Protein: 7.1 g/dL (ref 6.5–8.1)

## 2020-06-07 LAB — TSH: TSH: 0.935 u[IU]/mL (ref 0.308–3.960)

## 2020-06-07 MED ORDER — HEPARIN SOD (PORK) LOCK FLUSH 100 UNIT/ML IV SOLN
500.0000 [IU] | Freq: Once | INTRAVENOUS | Status: AC | PRN
  Administered 2020-06-07: 500 [IU]
  Filled 2020-06-07: qty 5

## 2020-06-07 MED ORDER — FAMOTIDINE IN NACL 20-0.9 MG/50ML-% IV SOLN
20.0000 mg | Freq: Once | INTRAVENOUS | Status: AC
  Administered 2020-06-07: 20 mg via INTRAVENOUS

## 2020-06-07 MED ORDER — FAMOTIDINE IN NACL 20-0.9 MG/50ML-% IV SOLN
INTRAVENOUS | Status: AC
  Filled 2020-06-07: qty 50

## 2020-06-07 MED ORDER — SODIUM CHLORIDE 0.9 % IV SOLN
Freq: Once | INTRAVENOUS | Status: AC
  Filled 2020-06-07: qty 250

## 2020-06-07 MED ORDER — SODIUM CHLORIDE 0.9 % IV SOLN
223.9500 mg | Freq: Once | INTRAVENOUS | Status: AC
  Administered 2020-06-07: 220 mg via INTRAVENOUS
  Filled 2020-06-07: qty 22

## 2020-06-07 MED ORDER — DIPHENHYDRAMINE HCL 50 MG/ML IJ SOLN
50.0000 mg | Freq: Once | INTRAMUSCULAR | Status: AC
  Administered 2020-06-07: 50 mg via INTRAVENOUS

## 2020-06-07 MED ORDER — SODIUM CHLORIDE 0.9 % IV SOLN
80.0000 mg/m2 | Freq: Once | INTRAVENOUS | Status: AC
  Administered 2020-06-07: 156 mg via INTRAVENOUS
  Filled 2020-06-07: qty 26

## 2020-06-07 MED ORDER — PALONOSETRON HCL INJECTION 0.25 MG/5ML
0.2500 mg | Freq: Once | INTRAVENOUS | Status: AC
  Administered 2020-06-07: 0.25 mg via INTRAVENOUS

## 2020-06-07 MED ORDER — PALONOSETRON HCL INJECTION 0.25 MG/5ML
INTRAVENOUS | Status: AC
  Filled 2020-06-07: qty 5

## 2020-06-07 MED ORDER — SODIUM CHLORIDE 0.9 % IV SOLN
200.0000 mg | Freq: Once | INTRAVENOUS | Status: AC
  Administered 2020-06-07: 200 mg via INTRAVENOUS
  Filled 2020-06-07: qty 8

## 2020-06-07 MED ORDER — DIPHENHYDRAMINE HCL 50 MG/ML IJ SOLN
INTRAMUSCULAR | Status: AC
  Filled 2020-06-07: qty 1

## 2020-06-07 MED ORDER — METHYLPREDNISOLONE SODIUM SUCC 40 MG IJ SOLR
20.0000 mg | Freq: Once | INTRAMUSCULAR | Status: AC
  Administered 2020-06-07: 20 mg via INTRAVENOUS

## 2020-06-07 MED ORDER — GABAPENTIN 100 MG PO CAPS: 100.0000 mg | ORAL_CAPSULE | Freq: Three times a day (TID) | ORAL | 0 refills | Status: DC

## 2020-06-07 MED ORDER — SODIUM CHLORIDE 0.9% FLUSH
10.0000 mL | INTRAVENOUS | Status: DC | PRN
  Administered 2020-06-07: 10 mL
  Filled 2020-06-07: qty 10

## 2020-06-07 MED ORDER — METHYLPREDNISOLONE SODIUM SUCC 40 MG IJ SOLR
INTRAMUSCULAR | Status: AC
  Filled 2020-06-07: qty 1

## 2020-06-07 NOTE — Progress Notes (Signed)
Pt stable upon discharge.  Ambulatory to lobby.

## 2020-06-07 NOTE — Patient Instructions (Signed)
[facility] Discharge Instructions for Patients Receiving Chemotherapy  Today you received the following chemotherapy agents: paclitaxel/carboplatin/keytruda  To help prevent nausea and vomiting after your treatment, we encourage you to take your nausea medication as directed.   If you develop nausea and vomiting that is not controlled by your nausea medication, call the clinic.   BELOW ARE SYMPTOMS THAT SHOULD BE REPORTED IMMEDIATELY:  *FEVER GREATER THAN 100.5 F  *CHILLS WITH OR WITHOUT FEVER  NAUSEA AND VOMITING THAT IS NOT CONTROLLED WITH YOUR NAUSEA MEDICATION  *UNUSUAL SHORTNESS OF BREATH  *UNUSUAL BRUISING OR BLEEDING  TENDERNESS IN MOUTH AND THROAT WITH OR WITHOUT PRESENCE OF ULCERS  *URINARY PROBLEMS  *BOWEL PROBLEMS  UNUSUAL RASH Items with * indicate a potential emergency and should be followed up as soon as possible.  Feel free to call the clinic should you have any questions or concerns. The clinic phone number is [phone].  Please show the [place] at check-in to the Emergency Department and triage nurse.

## 2020-06-08 ENCOUNTER — Encounter: Payer: Self-pay | Admitting: *Deleted

## 2020-06-08 LAB — T4: T4, Total: 5 ug/dL (ref 4.5–12.0)

## 2020-06-12 ENCOUNTER — Telehealth: Payer: Self-pay | Admitting: Hematology

## 2020-06-12 NOTE — Telephone Encounter (Signed)
No 12/1 los. No changes made to pt's schedule.  

## 2020-06-14 ENCOUNTER — Inpatient Hospital Stay: Payer: 59

## 2020-06-14 ENCOUNTER — Other Ambulatory Visit: Payer: Self-pay

## 2020-06-14 VITALS — BP 113/68 | HR 82 | Temp 98.2°F | Resp 18 | Ht 64.0 in | Wt 199.2 lb

## 2020-06-14 DIAGNOSIS — Z171 Estrogen receptor negative status [ER-]: Secondary | ICD-10-CM

## 2020-06-14 DIAGNOSIS — Z5111 Encounter for antineoplastic chemotherapy: Secondary | ICD-10-CM | POA: Diagnosis not present

## 2020-06-14 DIAGNOSIS — C50112 Malignant neoplasm of central portion of left female breast: Secondary | ICD-10-CM

## 2020-06-14 DIAGNOSIS — Z95828 Presence of other vascular implants and grafts: Secondary | ICD-10-CM

## 2020-06-14 LAB — CBC WITH DIFFERENTIAL (CANCER CENTER ONLY)
Abs Immature Granulocytes: 0.04 10*3/uL (ref 0.00–0.07)
Basophils Absolute: 0 10*3/uL (ref 0.0–0.1)
Basophils Relative: 1 %
Eosinophils Absolute: 0 10*3/uL (ref 0.0–0.5)
Eosinophils Relative: 1 %
HCT: 30.8 % — ABNORMAL LOW (ref 36.0–46.0)
Hemoglobin: 9.9 g/dL — ABNORMAL LOW (ref 12.0–15.0)
Immature Granulocytes: 1 %
Lymphocytes Relative: 12 %
Lymphs Abs: 0.5 10*3/uL — ABNORMAL LOW (ref 0.7–4.0)
MCH: 28.4 pg (ref 26.0–34.0)
MCHC: 32.1 g/dL (ref 30.0–36.0)
MCV: 88.3 fL (ref 80.0–100.0)
Monocytes Absolute: 0.3 10*3/uL (ref 0.1–1.0)
Monocytes Relative: 6 %
Neutro Abs: 3.5 10*3/uL (ref 1.7–7.7)
Neutrophils Relative %: 79 %
Platelet Count: 244 10*3/uL (ref 150–400)
RBC: 3.49 MIL/uL — ABNORMAL LOW (ref 3.87–5.11)
RDW: 17.8 % — ABNORMAL HIGH (ref 11.5–15.5)
WBC Count: 4.4 10*3/uL (ref 4.0–10.5)
nRBC: 0 % (ref 0.0–0.2)

## 2020-06-14 LAB — CMP (CANCER CENTER ONLY)
ALT: 33 U/L (ref 0–44)
AST: 24 U/L (ref 15–41)
Albumin: 3.8 g/dL (ref 3.5–5.0)
Alkaline Phosphatase: 121 U/L (ref 38–126)
Anion gap: 6 (ref 5–15)
BUN: 18 mg/dL (ref 6–20)
CO2: 29 mmol/L (ref 22–32)
Calcium: 9.8 mg/dL (ref 8.9–10.3)
Chloride: 105 mmol/L (ref 98–111)
Creatinine: 0.8 mg/dL (ref 0.44–1.00)
GFR, Estimated: >60 mL/min (ref >60)
Glucose, Bld: 95 mg/dL (ref 70–99)
Potassium: 4.4 mmol/L (ref 3.5–5.1)
Sodium: 140 mmol/L (ref 135–145)
Total Bilirubin: 0.5 mg/dL (ref 0.3–1.2)
Total Protein: 7.1 g/dL (ref 6.5–8.1)

## 2020-06-14 LAB — TSH: TSH: 1.322 u[IU]/mL (ref 0.308–3.960)

## 2020-06-14 MED ORDER — SODIUM CHLORIDE 0.9 % IV SOLN
80.0000 mg/m2 | Freq: Once | INTRAVENOUS | Status: AC
  Administered 2020-06-14: 156 mg via INTRAVENOUS
  Filled 2020-06-14: qty 26

## 2020-06-14 MED ORDER — SODIUM CHLORIDE 0.9 % IV SOLN
223.9500 mg | Freq: Once | INTRAVENOUS | Status: AC
  Administered 2020-06-14: 220 mg via INTRAVENOUS
  Filled 2020-06-14: qty 22

## 2020-06-14 MED ORDER — ALTEPLASE 2 MG IJ SOLR
2.0000 mg | Freq: Once | INTRAMUSCULAR | Status: AC
  Administered 2020-06-14: 2 mg
  Filled 2020-06-14: qty 2

## 2020-06-14 MED ORDER — SODIUM CHLORIDE 0.9% FLUSH
10.0000 mL | INTRAVENOUS | Status: DC | PRN
  Administered 2020-06-14: 10 mL
  Filled 2020-06-14: qty 10

## 2020-06-14 MED ORDER — PALONOSETRON HCL INJECTION 0.25 MG/5ML
0.2500 mg | Freq: Once | INTRAVENOUS | Status: AC
  Administered 2020-06-14: 0.25 mg via INTRAVENOUS

## 2020-06-14 MED ORDER — FAMOTIDINE IN NACL 20-0.9 MG/50ML-% IV SOLN
INTRAVENOUS | Status: AC
  Filled 2020-06-14: qty 50

## 2020-06-14 MED ORDER — METHYLPREDNISOLONE SODIUM SUCC 40 MG IJ SOLR
20.0000 mg | Freq: Once | INTRAMUSCULAR | Status: AC
  Administered 2020-06-14: 20 mg via INTRAVENOUS

## 2020-06-14 MED ORDER — SODIUM CHLORIDE 0.9 % IV SOLN
Freq: Once | INTRAVENOUS | Status: AC
  Filled 2020-06-14: qty 250

## 2020-06-14 MED ORDER — METHYLPREDNISOLONE SODIUM SUCC 40 MG IJ SOLR
INTRAMUSCULAR | Status: AC
  Filled 2020-06-14: qty 1

## 2020-06-14 MED ORDER — FAMOTIDINE IN NACL 20-0.9 MG/50ML-% IV SOLN
20.0000 mg | Freq: Once | INTRAVENOUS | Status: AC
  Administered 2020-06-14: 20 mg via INTRAVENOUS

## 2020-06-14 MED ORDER — DIPHENHYDRAMINE HCL 50 MG/ML IJ SOLN
INTRAMUSCULAR | Status: AC
  Filled 2020-06-14: qty 1

## 2020-06-14 MED ORDER — DIPHENHYDRAMINE HCL 50 MG/ML IJ SOLN
50.0000 mg | Freq: Once | INTRAMUSCULAR | Status: AC
  Administered 2020-06-14: 50 mg via INTRAVENOUS

## 2020-06-14 MED ORDER — PALONOSETRON HCL INJECTION 0.25 MG/5ML
INTRAVENOUS | Status: AC
  Filled 2020-06-14: qty 5

## 2020-06-14 MED ORDER — HEPARIN SOD (PORK) LOCK FLUSH 100 UNIT/ML IV SOLN
500.0000 [IU] | Freq: Once | INTRAVENOUS | Status: AC | PRN
  Administered 2020-06-14: 500 [IU]
  Filled 2020-06-14: qty 5

## 2020-06-14 NOTE — Patient Instructions (Signed)
[facility] Discharge Instructions for Patients Receiving Chemotherapy  Today you received the following chemotherapy agents Paclitaxel (TAXOL) & Carboplatin (PARAPLATIN).  To help prevent nausea and vomiting after your treatment, we encourage you to take your nausea medication as prescribed.  If you develop nausea and vomiting that is not controlled by your nausea medication, call the clinic.   BELOW ARE SYMPTOMS THAT SHOULD BE REPORTED IMMEDIATELY:  *FEVER GREATER THAN 100.5 F  *CHILLS WITH OR WITHOUT FEVER  NAUSEA AND VOMITING THAT IS NOT CONTROLLED WITH YOUR NAUSEA MEDICATION  *UNUSUAL SHORTNESS OF BREATH  *UNUSUAL BRUISING OR BLEEDING  TENDERNESS IN MOUTH AND THROAT WITH OR WITHOUT PRESENCE OF ULCERS  *URINARY PROBLEMS  *BOWEL PROBLEMS  UNUSUAL RASH Items with * indicate a potential emergency and should be followed up as soon as possible.  Feel free to call the clinic should you have any questions or concerns. The clinic phone number is [phone].  Please show the CHEMO ALERT CARD at check-in to the Emergency Department and triage nurse.   

## 2020-06-15 LAB — T4: T4, Total: 5.4 ug/dL (ref 4.5–12.0)

## 2020-06-16 ENCOUNTER — Telehealth: Payer: Self-pay

## 2020-06-16 NOTE — Telephone Encounter (Signed)
Referral, ov, demographics, and insurance card faxed to Dr Para Skeans office

## 2020-06-20 ENCOUNTER — Telehealth: Payer: Self-pay

## 2020-06-20 ENCOUNTER — Encounter: Payer: Self-pay | Admitting: *Deleted

## 2020-06-20 NOTE — Telephone Encounter (Signed)
Sharyn Lull from Dr Charlotte Crumb office called stating that Ms Harbin has [employer] health insurance.  Dr Charlotte Crumb office does not participate with that insurance.

## 2020-06-20 NOTE — Progress Notes (Signed)
[place]   [phone] [phone]   Clinic Follow up Note   Patient Care Team: [name], MD as PCP - General (Family Medicine) Mauro Kaufmann, RN as Oncology Nurse Navigator Rockwell Germany, RN as Oncology Nurse Navigator [name], MD as Consulting Physician (General Surgery) Truitt Merle, MD as Consulting Physician (Hematology) 06/21/2020  CHIEF COMPLAINT: Follow up left breast cancer   SUMMARY OF ONCOLOGIC HISTORY: Oncology History Overview Note  Cancer Staging Cancer of central portion of left female breast [facility]) Staging form: Breast, AJCC 8th Edition - Clinical: No stage assigned - Unsigned    Cancer of central portion of left female breast ([place])  02/23/2020 Mammogram   Diagnostic Mammogram 02/23/20  IMPRESSION The 2x1x2.6cm irregular mass in teh left breast at 12:00 posiiton middle depth is highly suspicious of malignancy. An [place] is recommended for further evaluation and biopsy planning purposes.   02/23/2020 Breast US   [name] 02/23/20  IMPRESSION 2 adjacent spiculated masses in the left brast at 12:00 position 3 cm from the nipple (2.1x0.9x1.1cm and 1.1x1.4x0.5cm) is suggestive of malignancy.   Multiple abnormal left subpectoral and left axillary nodes measuring 3.3x2.1 cm concerning for metastatic adenopathy.    Left breast skin thickening and edema may be secondary congestive edema due to extensive axillary adenopathy.    03/08/2020 Initial Biopsy   Diagnosis 1.Breast, left, needle core biopsy, 12:00 position, 3cmfn -INVASIVE DUCTAL CARCINOMA -SEE COMMENT  2. Lymph node, needle/core biopsy, left axilla -METASTATIC CARCINOMA INVOLVING A LYMPH NODE  -LYMPHOVASCULAR SPACE INVASION PRESENT   Microscopic Comment  1.Based on the biopsy the carcinoma appears Nottingham Grade 3 or 3 and measures 1 cm in the greatest linear extent.    03/08/2020 Receptors her2   ER- Negative 0% PR - Negative 0% HER2 -  Negative  KI 67 - 80%    03/08/2020 Cancer Staging   Staging form: Breast, AJCC 8th Edition - Clinical stage from 03/08/2020: Stage IIIB (cT2, cN1, cM0, G3, ER-, PR-, HER2-) - Signed by Truitt Merle, MD on 03/10/2020   03/10/2020 Initial Diagnosis   Cancer of central portion of left female breast ([place])   03/16/2020 Breast MRI   IMPRESSION: 1. 8.1 x 7.8 x 6.6 cm biopsy proven invasive ductal carcinoma in the central right breast, involving 3 quadrants. 2. 3.0 x 1.7 x 1.1 cm satellite mass more inferiorly in lower inner quadrant of the left breast, compatible with additional malignancy. 3. Metastatic level 1 and level 2 left axillary lymph nodes. 4. No evidence of malignancy on the right.   03/17/2020 Imaging   IMPRESSION: CT CAP w contrast  1. Diffuse skin thickening in the left breast with left axillary and subpectoral lymphadenopathy, as well as a mildly enlarged left supraclavicular lymph node, which likely represents metastatic lymphadenopathy. No other definite extra nodal metastatic disease noted elsewhere in the chest, abdomen or pelvis. 2. Large mass in the central anatomic pelvis which is of uncertain origin, potentially a large exophytic fibroid or a large solid mass arising from the right ovary. Further evaluation with pelvic ultrasound is strongly recommended.   03/21/2020 Imaging   Bone Scan  IMPRESSION: Apparent arthropathy at L5. No bony metastatic disease is demonstrable on this study. Scattered foci of abnormal uptake in a pelvic mass is of uncertain etiology given absence of calcification in this mass by CT. This mass compresses the urinary bladder. It is possible that some of the increased uptake in this area actually represents physiologic  uptake within the bladder.   Kidneys noted in flank positions bilaterally.     03/22/2020 -  Neo-Adjuvant Chemotherapy   Neoadjuvant Adriamycin and Cytoxan q2weeks for 4 cycles starting 03/22/20-05/03/20 followed by weekly Taxol and  Carboplatin for 12 weeks starting 05/17/20   03/24/2020 Imaging   US Pelvis  IMPRESSION: 1. Large pedunculated lesion directly contiguous with the uterine most suggestive of a large subserosal fibroid which measures up to 8.2 cm. 2. Additional 1 cm probable intramural fibroid in the right anterior uterine body. 3. No other acute or worrisome pelvic abnormality.   04/28/2020 -  Antibody Plan   Added Keytruda q3weeks starting 04/28/20 to complete 1 year of treatment      CURRENT THERAPY:  Neoadjuvant chemo ddACq2weeks for 4 cyclesstarting 03/22/20-05/03/20, followed by weekly carbo/taxolfor 12 weeksstarting 05/17/20. -Added Keytruda q3weeks to complete 1 year of treatmenton10/22/21  INTERVAL HISTORY:Caitlyn Hendricks returns for follow up and treatment as scheduled. She completed 5 cycles of weekly carbo/taxol and 3 cycles of q3 weeks [place].  She started gabapentin after last cycle for hot flashes and body aches, hot flashes have significantly improved.  She did not have body aches after last cycle.  She started low intensity exercise and had shortness of breath, resolves with rest.  Denies chest pain.  She is eating and drinking, no mucositis.  She has right eye pressure and pain in her teeth with occasional blood-tinged nasal drainage.  Denies cough, sore throat, fever, or chills.  Mild diarrhea for 2 days is well managed.  Denies neuropathy.  She continues to have intermittent mild pain throughout the right breast which has been stable on treatment.  No new concerns in the left breast.    MEDICAL HISTORY:  Past Medical History:  Diagnosis Date  . Anxiety   . Depression     SURGICAL HISTORY: Past Surgical History:  Procedure Laterality Date  . PORTACATH PLACEMENT Right 03/21/2020   Procedure: INSERTION PORT-A-CATH WITH ULTRASOUND GUIDANCE;  Surgeon: [name], MD;  Location: [facility];  Service: General;  Laterality: Right;    I have reviewed  the social history and family history with the patient and they are unchanged from previous note.  ALLERGIES:  is allergic to medroxyprogesterone.  MEDICATIONS:  Current Outpatient Medications  Medication Sig Dispense Refill  . ALPRAZolam (XANAX) 0.5 MG tablet Take 0.5 mg by mouth 3 (three) times daily.    Marland Kitchen ALPRAZolam (XANAX) 1 MG tablet Take 1 mg by mouth 3 (three) times daily as needed for anxiety.     . baclofen (LIORESAL) 10 MG tablet Take 1 tablet (10 mg total) by mouth 2 (two) times daily as needed for muscle spasms. 30 each 0  . citalopram (CELEXA) 20 MG tablet Take 20 mg by mouth daily.    . fluconazole (DIFLUCAN) 150 MG tablet Take 1 tablet for a yeast infection if needed, may repeat in 3 days if needed Use if needed while on antibiotic. 2 tablet 0  . gabapentin (NEURONTIN) 100 MG capsule Take 1 capsule (100 mg total) by mouth 3 (three) times daily. May increase to 367m three times daily as need in a few weeks if tolerates well 90 capsule 0  . lidocaine-prilocaine (EMLA) cream Apply to affected area once 30 g 3  . methylPREDNISolone (MEDROL DOSEPAK) 4 MG TBPK tablet Take 1 tablet (4 mg total) by mouth taper from 4 doses each day to 1 dose and stop. 1 each 0  . omeprazole (PRILOSEC) 20 MG capsule Take  1 capsule (20 mg total) by mouth 2 (two) times daily before a meal. 60 capsule 2  . prochlorperazine (COMPAZINE) 10 MG tablet Take 1 tablet (10 mg total) by mouth every 6 (six) hours as needed (Nausea or vomiting). 30 tablet 2  . sertraline (ZOLOFT) 50 MG tablet Take by mouth.    . traMADol (ULTRAM) 50 MG tablet Take 1 tablet (50 mg total) by mouth daily as needed for severe pain (headache). 30 tablet 0  . triamcinolone ointment (KENALOG) 0.5 % Apply 1 application topically 2 (two) times daily. 30 g 2  . azithromycin (ZITHROMAX) 250 MG tablet Take as prescribed 6 each 0  . dexamethasone (DECADRON) 4 MG tablet Take 2 tablets by mouth daily starting the day after Carboplatin and Cytoxan x 3  days. Take with food. (Patient not taking: Reported on 06/21/2020) 30 tablet 1  . fluticasone (FLONASE) 50 MCG/ACT nasal spray Place 2 sprays into both nostrils daily for 7 days. 15.8 mL 0  . ondansetron (ZOFRAN) 8 MG tablet Take 1 tablet (8 mg total) by mouth 2 (two) times daily as needed. Start on the third day after chemotherapy. (Patient not taking: Reported on 06/21/2020) 30 tablet 2  . zolpidem (AMBIEN) 10 MG tablet Take 1 tablet (10 mg total) by mouth at bedtime as needed for sleep. 30 tablet 1   No current facility-administered medications for this visit.   Facility-Administered Medications Ordered in Other Visits  Medication Dose Route Frequency Provider Last Rate Last Admin  . CARBOplatin (PARAPLATIN) 220 mg in sodium chloride 0.9 % 250 mL chemo infusion  220 mg Intravenous Once Truitt Merle, MD      . famotidine (PEPCID) IVPB 20 mg premix  20 mg Intravenous Once Truitt Merle, MD 200 mL/hr at 06/21/20 1108 20 mg at 06/21/20 1108  . heparin lock flush 100 unit/mL  500 Units Intracatheter Once PRN Truitt Merle, MD      . PACLitaxel (TAXOL) 156 mg in sodium chloride 0.9 % 250 mL chemo infusion (</= 42m/m2)  80 mg/m2 (Treatment Plan Recorded) Intravenous Once FTruitt Merle MD      . sodium chloride flush (NS) 0.9 % injection 10 mL  10 mL Intravenous PRN BAlla Feeling NP   10 mL at 03/28/20 1104  . sodium chloride flush (NS) 0.9 % injection 10 mL  10 mL Intracatheter PRN FTruitt Merle MD        PHYSICAL EXAMINATION: ECOG PERFORMANCE STATUS: 1 - Symptomatic but completely ambulatory  Vitals:   06/21/20 0938  BP: 131/88  Pulse: 92  Resp: 15  Temp: 97.9 F (36.6 C)  SpO2: 100%   Filed Weights   06/21/20 0938  Weight: 203 lb 6.4 oz (92.3 kg)    GENERAL:alert, no distress and comfortable SKIN: mild rash to chest  EYES:  sclera clear OROPHARYNX: No thrush or ulcers NECK: Without mass LYMPH:  no palpable axillary lymphadenopathy  LUNGS: clear with normal breathing effort HEART: regular  rate & rhythm, no lower extremity edema NEURO: alert & oriented x 3 with fluent speech, no focal motor/sensory deficits PAC without erythema Breast exam: Breasts are symmetric without nipple discharge or inversion.  There is no palpable mass in either breast or axilla that I could appreciate, previous left breast mass and axillary adenopathy resolved  LABORATORY DATA:  I have reviewed the data as listed CBC Latest Ref Rng & Units 06/21/2020 06/14/2020 06/07/2020  WBC 4.0 - 10.5 K/uL 3.0(L) 4.4 4.7  Hemoglobin 12.0 - 15.0 g/dL 9.3(L)  9.9(L) 10.3(L)  Hematocrit 36.0 - 46.0 % 28.7(L) 30.8(L) 32.1(L)  Platelets 150 - 400 K/uL 199 244 296     CMP Latest Ref Rng & Units 06/21/2020 06/14/2020 06/07/2020  Glucose 70 - 99 mg/dL 91 95 90  BUN 6 - 20 mg/dL 15 18 13   Creatinine 0.44 - 1.00 mg/dL 0.78 0.80 0.69  Sodium 135 - 145 mmol/L 138 140 138  Potassium 3.5 - 5.1 mmol/L 3.9 4.4 4.3  Chloride 98 - 111 mmol/L 105 105 106  CO2 22 - 32 mmol/L 26 29 26   Calcium 8.9 - 10.3 mg/dL 9.2 9.8 9.6  Total Protein 6.5 - 8.1 g/dL 6.8 7.1 7.1  Total Bilirubin 0.3 - 1.2 mg/dL 0.4 0.5 0.2(L)  Alkaline Phos 38 - 126 U/L 115 121 122  AST 15 - 41 U/L 32 24 32  ALT 0 - 44 U/L 28 33 52(H)      RADIOGRAPHIC STUDIES: I have personally reviewed the radiological images as listed and agreed with the findings in the report. No results found.   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 Her ER/PR/HER2 markers were all negative.  Staging work-up negative for distant metastasis -To downstage her disease and  reduce recurrence risk she started neoadjuvant chemo with DD AC every 2 weeks for 4 cycles 03/22/2020-05/03/2020, she started weekly carbo/Taxol for 12 weeks starting 05/17/2020 -Based on recently published keynote 522 trial data, pembrolizumab q 3 weeks was added, for a total of 1 year, starting 04/28/2020 -Baseline echo was normal, EF 60-65%  2. Symptom management: Headache, constipation, fatigue, hot flash, body aches -With cycle 1 AC;headaches likely related to chemo vs steroids vs anti-emetics. Improved with IVF and tramadol in clinic -Avoiding steroids now that she is on Keytruda -she developed body aches and hot flashes after starting carbo/taxol. HF much improved with gabapentin and body aches resolved after week 5 and starting gabapentin  3. Genetic Testing -Given her young age, Her MGM's breast cancer and father prostate cancer, she is eligible for genetic testing.  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 Anxiety/Depression, Social Support  -She is currently on Xanax 5m TID and Celexa 531m This is managed by her PCP. -She has been more anxious with her cancer diagnosis and would like dose increase. -She has been referred to social work, and CuTeacher, English as a foreign languageShe owns her DoDevelopment worker, international aidnd notes she does not plan to work during this treatment time.She was given our financial advocate's contact information  5. Right breast pain -developed ~ 05/08/20, no obvious injury or strain, but possibly related to MSK - overuse (ball throwing) or strain vs sleep position.  -exam is benign -continue supportive care with heat, NSAID, baclofen PRN  -stable, continue monitoring  Disposition:  Caitlyn. PrZwillingppears stable.  She completed 5 cycles of weekly carbo/Taxol and continues every 3 week Keytruda.  She tolerates treatment well overall.  Hot flashes and body aches improved on gabapentin.  She is able to recover and function  well.  The biopsy-proven malignant left breast mass remains not palpable on my exam. Right breast  benign. She has responded very well to treatment thus far.  She has signs of rhinosinusitis, I recommend Z-Pak and Flonase, prescriptions were sent to her pharmacy.  She knows to call if symptoms worsen or fail to improve.  She is being referred to another plastic surgeon in network.   Labs reviewed, she will proceed with 6th weekly carbo/Taxol today as planned.  She will receive another cycle of Keytruda with carbo/Taxol next week.    She is traveling for the holidays, will reschedule 12/27 follow-up to a phone visit on 12/28 prior to lab and treatment on 12/29.    Orders Placed This Encounter  Procedures  . Ambulatory referral to Plastic Surgery    Referral Priority:   Routine    Referral Type:   Surgical    Referral Reason:   Specialty Services Required    Requested Specialty:   Plastic Surgery    Number of Visits Requested:   1   All questions were answered. The patient knows to call the clinic with any problems, questions or concerns. No barriers to learning were detected. Total encounter time was 30 minutes.      Alla Feeling, NP 06/21/20

## 2020-06-21 ENCOUNTER — Inpatient Hospital Stay (HOSPITAL_BASED_OUTPATIENT_CLINIC_OR_DEPARTMENT_OTHER): Payer: 59 | Admitting: Nurse Practitioner

## 2020-06-21 ENCOUNTER — Inpatient Hospital Stay: Payer: 59

## 2020-06-21 ENCOUNTER — Encounter: Payer: Self-pay | Admitting: Nurse Practitioner

## 2020-06-21 ENCOUNTER — Other Ambulatory Visit: Payer: Self-pay

## 2020-06-21 VITALS — BP 131/88 | HR 92 | Temp 97.9°F | Resp 15 | Ht 64.0 in | Wt 203.4 lb

## 2020-06-21 DIAGNOSIS — Z171 Estrogen receptor negative status [ER-]: Secondary | ICD-10-CM | POA: Diagnosis not present

## 2020-06-21 DIAGNOSIS — J329 Chronic sinusitis, unspecified: Secondary | ICD-10-CM

## 2020-06-21 DIAGNOSIS — C50112 Malignant neoplasm of central portion of left female breast: Secondary | ICD-10-CM | POA: Diagnosis not present

## 2020-06-21 DIAGNOSIS — Z5111 Encounter for antineoplastic chemotherapy: Secondary | ICD-10-CM | POA: Diagnosis not present

## 2020-06-21 DIAGNOSIS — J310 Chronic rhinitis: Secondary | ICD-10-CM

## 2020-06-21 DIAGNOSIS — Z95828 Presence of other vascular implants and grafts: Secondary | ICD-10-CM

## 2020-06-21 LAB — CBC WITH DIFFERENTIAL (CANCER CENTER ONLY)
Abs Immature Granulocytes: 0.03 10*3/uL (ref 0.00–0.07)
Basophils Absolute: 0 10*3/uL (ref 0.0–0.1)
Basophils Relative: 1 %
Eosinophils Absolute: 0 10*3/uL (ref 0.0–0.5)
Eosinophils Relative: 1 %
HCT: 28.7 % — ABNORMAL LOW (ref 36.0–46.0)
Hemoglobin: 9.3 g/dL — ABNORMAL LOW (ref 12.0–15.0)
Immature Granulocytes: 1 %
Lymphocytes Relative: 17 %
Lymphs Abs: 0.5 10*3/uL — ABNORMAL LOW (ref 0.7–4.0)
MCH: 28.9 pg (ref 26.0–34.0)
MCHC: 32.4 g/dL (ref 30.0–36.0)
MCV: 89.1 fL (ref 80.0–100.0)
Monocytes Absolute: 0.3 10*3/uL (ref 0.1–1.0)
Monocytes Relative: 8 %
Neutro Abs: 2.1 10*3/uL (ref 1.7–7.7)
Neutrophils Relative %: 72 %
Platelet Count: 199 10*3/uL (ref 150–400)
RBC: 3.22 MIL/uL — ABNORMAL LOW (ref 3.87–5.11)
RDW: 18.4 % — ABNORMAL HIGH (ref 11.5–15.5)
WBC Count: 3 10*3/uL — ABNORMAL LOW (ref 4.0–10.5)
nRBC: 0 % (ref 0.0–0.2)

## 2020-06-21 LAB — CMP (CANCER CENTER ONLY)
ALT: 28 U/L (ref 0–44)
AST: 32 U/L (ref 15–41)
Albumin: 3.5 g/dL (ref 3.5–5.0)
Alkaline Phosphatase: 115 U/L (ref 38–126)
Anion gap: 7 (ref 5–15)
BUN: 15 mg/dL (ref 6–20)
CO2: 26 mmol/L (ref 22–32)
Calcium: 9.2 mg/dL (ref 8.9–10.3)
Chloride: 105 mmol/L (ref 98–111)
Creatinine: 0.78 mg/dL (ref 0.44–1.00)
GFR, Estimated: >60 mL/min (ref >60)
Glucose, Bld: 91 mg/dL (ref 70–99)
Potassium: 3.9 mmol/L (ref 3.5–5.1)
Sodium: 138 mmol/L (ref 135–145)
Total Bilirubin: 0.4 mg/dL (ref 0.3–1.2)
Total Protein: 6.8 g/dL (ref 6.5–8.1)

## 2020-06-21 LAB — TSH: TSH: 1.009 u[IU]/mL (ref 0.308–3.960)

## 2020-06-21 MED ORDER — SODIUM CHLORIDE 0.9% FLUSH
10.0000 mL | INTRAVENOUS | Status: DC | PRN
  Administered 2020-06-21: 14:00:00 10 mL
  Filled 2020-06-21: qty 10

## 2020-06-21 MED ORDER — PALONOSETRON HCL INJECTION 0.25 MG/5ML
INTRAVENOUS | Status: AC
  Filled 2020-06-21: qty 5

## 2020-06-21 MED ORDER — DIPHENHYDRAMINE HCL 50 MG/ML IJ SOLN
INTRAMUSCULAR | Status: AC
  Filled 2020-06-21: qty 1

## 2020-06-21 MED ORDER — FAMOTIDINE IN NACL 20-0.9 MG/50ML-% IV SOLN
INTRAVENOUS | Status: AC
  Filled 2020-06-21: qty 50

## 2020-06-21 MED ORDER — DIPHENHYDRAMINE HCL 50 MG/ML IJ SOLN
50.0000 mg | Freq: Once | INTRAMUSCULAR | Status: AC
  Administered 2020-06-21: 11:00:00 50 mg via INTRAVENOUS

## 2020-06-21 MED ORDER — SODIUM CHLORIDE 0.9 % IV SOLN
223.9500 mg | Freq: Once | INTRAVENOUS | Status: AC
  Administered 2020-06-21: 13:00:00 220 mg via INTRAVENOUS
  Filled 2020-06-21: qty 22

## 2020-06-21 MED ORDER — FLUTICASONE PROPIONATE 50 MCG/ACT NA SUSP: 2.0000 | Freq: Every day | NASAL | 0 refills | Status: DC

## 2020-06-21 MED ORDER — PALONOSETRON HCL INJECTION 0.25 MG/5ML
0.2500 mg | Freq: Once | INTRAVENOUS | Status: AC
  Administered 2020-06-21: 11:00:00 0.25 mg via INTRAVENOUS

## 2020-06-21 MED ORDER — FAMOTIDINE IN NACL 20-0.9 MG/50ML-% IV SOLN
20.0000 mg | Freq: Once | INTRAVENOUS | Status: AC
  Administered 2020-06-21: 11:00:00 20 mg via INTRAVENOUS

## 2020-06-21 MED ORDER — METHYLPREDNISOLONE SODIUM SUCC 40 MG IJ SOLR
INTRAMUSCULAR | Status: AC
  Filled 2020-06-21: qty 1

## 2020-06-21 MED ORDER — SODIUM CHLORIDE 0.9 % IV SOLN
80.0000 mg/m2 | Freq: Once | INTRAVENOUS | Status: AC
  Administered 2020-06-21: 12:00:00 156 mg via INTRAVENOUS
  Filled 2020-06-21: qty 26

## 2020-06-21 MED ORDER — METHYLPREDNISOLONE SODIUM SUCC 40 MG IJ SOLR
20.0000 mg | Freq: Once | INTRAMUSCULAR | Status: AC
  Administered 2020-06-21: 11:00:00 20 mg via INTRAVENOUS

## 2020-06-21 MED ORDER — HEPARIN SOD (PORK) LOCK FLUSH 100 UNIT/ML IV SOLN
500.0000 [IU] | Freq: Once | INTRAVENOUS | Status: AC | PRN
  Administered 2020-06-21: 14:00:00 500 [IU]
  Filled 2020-06-21: qty 5

## 2020-06-21 MED ORDER — SODIUM CHLORIDE 0.9 % IV SOLN
Freq: Once | INTRAVENOUS | Status: AC
  Filled 2020-06-21: qty 250

## 2020-06-21 MED ORDER — AZITHROMYCIN 250 MG PO TABS: ORAL_TABLET | ORAL | 0 refills | Status: DC

## 2020-06-21 MED ORDER — SODIUM CHLORIDE 0.9% FLUSH
10.0000 mL | Freq: Once | INTRAVENOUS | Status: AC
Start: 2020-06-21 — End: 2020-06-21
  Administered 2020-06-21: 09:00:00 10 mL
  Filled 2020-06-21: qty 10

## 2020-06-21 NOTE — Patient Instructions (Signed)
[facility] °Discharge Instructions for Patients Receiving Chemotherapy ° °Today you received the following chemotherapy agents Taxol; Carboplatin ° °To help prevent nausea and vomiting after your treatment, we encourage you to take your nausea medication as directed °  °If you develop nausea and vomiting that is not controlled by your nausea medication, call the clinic.  ° °BELOW ARE SYMPTOMS THAT SHOULD BE REPORTED IMMEDIATELY: °· *FEVER GREATER THAN 100.5 F °· *CHILLS WITH OR WITHOUT FEVER °· NAUSEA AND VOMITING THAT IS NOT CONTROLLED WITH YOUR NAUSEA MEDICATION °· *UNUSUAL SHORTNESS OF BREATH °· *UNUSUAL BRUISING OR BLEEDING °· TENDERNESS IN MOUTH AND THROAT WITH OR WITHOUT PRESENCE OF ULCERS °· *URINARY PROBLEMS °· *BOWEL PROBLEMS °· UNUSUAL RASH °Items with * indicate a potential emergency and should be followed up as soon as possible. ° °Feel free to call the clinic should you have any questions or concerns. The clinic phone number is [phone]. ° °Please show the CHEMO ALERT CARD at check-in to the Emergency Department and triage nurse. ° ° °

## 2020-06-22 ENCOUNTER — Telehealth: Payer: Self-pay | Admitting: Nurse Practitioner

## 2020-06-22 LAB — T4: T4, Total: 4.8 ug/dL (ref 4.5–12.0)

## 2020-06-22 NOTE — Telephone Encounter (Signed)
Scheduled appointments per 12/15 los. Called patient, no answer. Left message for patient with appointments dates and times.

## 2020-06-24 ENCOUNTER — Other Ambulatory Visit: Payer: Self-pay | Admitting: Hematology

## 2020-06-27 ENCOUNTER — Other Ambulatory Visit: Payer: Self-pay | Admitting: Hematology

## 2020-06-28 ENCOUNTER — Inpatient Hospital Stay: Payer: 59

## 2020-06-28 ENCOUNTER — Other Ambulatory Visit: Payer: Self-pay

## 2020-06-28 VITALS — BP 125/90 | HR 91 | Temp 97.6°F | Resp 16 | Wt 202.8 lb

## 2020-06-28 DIAGNOSIS — C50112 Malignant neoplasm of central portion of left female breast: Secondary | ICD-10-CM

## 2020-06-28 DIAGNOSIS — Z95828 Presence of other vascular implants and grafts: Secondary | ICD-10-CM

## 2020-06-28 DIAGNOSIS — Z5111 Encounter for antineoplastic chemotherapy: Secondary | ICD-10-CM | POA: Diagnosis not present

## 2020-06-28 DIAGNOSIS — Z171 Estrogen receptor negative status [ER-]: Secondary | ICD-10-CM

## 2020-06-28 LAB — CBC WITH DIFFERENTIAL (CANCER CENTER ONLY)
Abs Immature Granulocytes: 0.01 10*3/uL (ref 0.00–0.07)
Basophils Absolute: 0 10*3/uL (ref 0.0–0.1)
Basophils Relative: 1 %
Eosinophils Absolute: 0 10*3/uL (ref 0.0–0.5)
Eosinophils Relative: 1 %
HCT: 30.5 % — ABNORMAL LOW (ref 36.0–46.0)
Hemoglobin: 9.7 g/dL — ABNORMAL LOW (ref 12.0–15.0)
Immature Granulocytes: 0 %
Lymphocytes Relative: 20 %
Lymphs Abs: 0.5 10*3/uL — ABNORMAL LOW (ref 0.7–4.0)
MCH: 29 pg (ref 26.0–34.0)
MCHC: 31.8 g/dL (ref 30.0–36.0)
MCV: 91.3 fL (ref 80.0–100.0)
Monocytes Absolute: 0.2 10*3/uL (ref 0.1–1.0)
Monocytes Relative: 9 %
Neutro Abs: 1.6 10*3/uL — ABNORMAL LOW (ref 1.7–7.7)
Neutrophils Relative %: 69 %
Platelet Count: 211 10*3/uL (ref 150–400)
RBC: 3.34 MIL/uL — ABNORMAL LOW (ref 3.87–5.11)
RDW: 18.5 % — ABNORMAL HIGH (ref 11.5–15.5)
WBC Count: 2.4 10*3/uL — ABNORMAL LOW (ref 4.0–10.5)
nRBC: 0 % (ref 0.0–0.2)

## 2020-06-28 LAB — CMP (CANCER CENTER ONLY)
ALT: 45 U/L — ABNORMAL HIGH (ref 0–44)
AST: 37 U/L (ref 15–41)
Albumin: 3.8 g/dL (ref 3.5–5.0)
Alkaline Phosphatase: 117 U/L (ref 38–126)
Anion gap: 8 (ref 5–15)
BUN: 18 mg/dL (ref 6–20)
CO2: 24 mmol/L (ref 22–32)
Calcium: 9.4 mg/dL (ref 8.9–10.3)
Chloride: 106 mmol/L (ref 98–111)
Creatinine: 0.75 mg/dL (ref 0.44–1.00)
GFR, Estimated: >60 mL/min (ref >60)
Glucose, Bld: 98 mg/dL (ref 70–99)
Potassium: 4.2 mmol/L (ref 3.5–5.1)
Sodium: 138 mmol/L (ref 135–145)
Total Bilirubin: 0.5 mg/dL (ref 0.3–1.2)
Total Protein: 7.2 g/dL (ref 6.5–8.1)

## 2020-06-28 LAB — TSH: TSH: 1.379 u[IU]/mL (ref 0.308–3.960)

## 2020-06-28 MED ORDER — CARBOPLATIN CHEMO INJECTION 450 MG/45ML
223.9500 mg | Freq: Once | INTRAVENOUS | Status: AC
Start: 2020-06-28 — End: 2020-06-28
  Administered 2020-06-28: 13:00:00 220 mg via INTRAVENOUS
  Filled 2020-06-28: qty 22

## 2020-06-28 MED ORDER — METHYLPREDNISOLONE SODIUM SUCC 40 MG IJ SOLR
20.0000 mg | Freq: Once | INTRAMUSCULAR | Status: AC
  Administered 2020-06-28: 11:00:00 20 mg via INTRAVENOUS

## 2020-06-28 MED ORDER — SODIUM CHLORIDE 0.9% FLUSH
10.0000 mL | INTRAVENOUS | Status: DC | PRN
  Administered 2020-06-28: 14:00:00 10 mL
  Filled 2020-06-28: qty 10

## 2020-06-28 MED ORDER — FAMOTIDINE IN NACL 20-0.9 MG/50ML-% IV SOLN
20.0000 mg | Freq: Once | INTRAVENOUS | Status: AC
Start: 2020-06-28 — End: 2020-06-28
  Administered 2020-06-28: 11:00:00 20 mg via INTRAVENOUS

## 2020-06-28 MED ORDER — METHYLPREDNISOLONE SODIUM SUCC 40 MG IJ SOLR
INTRAMUSCULAR | Status: AC
  Filled 2020-06-28: qty 1

## 2020-06-28 MED ORDER — PALONOSETRON HCL INJECTION 0.25 MG/5ML
INTRAVENOUS | Status: AC
  Filled 2020-06-28: qty 5

## 2020-06-28 MED ORDER — DIPHENHYDRAMINE HCL 50 MG/ML IJ SOLN
50.0000 mg | Freq: Once | INTRAMUSCULAR | Status: AC
  Administered 2020-06-28: 11:00:00 50 mg via INTRAVENOUS

## 2020-06-28 MED ORDER — SODIUM CHLORIDE 0.9% FLUSH
10.0000 mL | Freq: Once | INTRAVENOUS | Status: AC
  Administered 2020-06-28: 09:00:00 10 mL
  Filled 2020-06-28: qty 10

## 2020-06-28 MED ORDER — SODIUM CHLORIDE 0.9 % IV SOLN
Freq: Once | INTRAVENOUS | Status: AC
  Filled 2020-06-28: qty 250

## 2020-06-28 MED ORDER — FAMOTIDINE IN NACL 20-0.9 MG/50ML-% IV SOLN
INTRAVENOUS | Status: AC
  Filled 2020-06-28: qty 50

## 2020-06-28 MED ORDER — SODIUM CHLORIDE 0.9 % IV SOLN
80.0000 mg/m2 | Freq: Once | INTRAVENOUS | Status: AC
  Administered 2020-06-28: 12:00:00 156 mg via INTRAVENOUS
  Filled 2020-06-28: qty 26

## 2020-06-28 MED ORDER — PALONOSETRON HCL INJECTION 0.25 MG/5ML
0.2500 mg | Freq: Once | INTRAVENOUS | Status: AC
  Administered 2020-06-28: 10:00:00 0.25 mg via INTRAVENOUS

## 2020-06-28 MED ORDER — SODIUM CHLORIDE 0.9 % IV SOLN
200.0000 mg | Freq: Once | INTRAVENOUS | Status: AC
  Administered 2020-06-28: 11:00:00 200 mg via INTRAVENOUS
  Filled 2020-06-28: qty 8

## 2020-06-28 MED ORDER — DIPHENHYDRAMINE HCL 50 MG/ML IJ SOLN
INTRAMUSCULAR | Status: AC
  Filled 2020-06-28: qty 1

## 2020-06-28 MED ORDER — HEPARIN SOD (PORK) LOCK FLUSH 100 UNIT/ML IV SOLN
500.0000 [IU] | Freq: Once | INTRAVENOUS | Status: AC | PRN
  Administered 2020-06-28: 14:00:00 500 [IU]
  Filled 2020-06-28: qty 5

## 2020-06-28 NOTE — Telephone Encounter (Signed)
Please see refill request.

## 2020-06-28 NOTE — Patient Instructions (Signed)

## 2020-06-29 LAB — T4: T4, Total: 5.7 ug/dL (ref 4.5–12.0)

## 2020-07-03 ENCOUNTER — Encounter: Payer: Self-pay | Admitting: *Deleted

## 2020-07-03 ENCOUNTER — Inpatient Hospital Stay: Payer: 59 | Admitting: Nurse Practitioner

## 2020-07-03 ENCOUNTER — Ambulatory Visit: Payer: 59 | Admitting: Nurse Practitioner

## 2020-07-03 ENCOUNTER — Inpatient Hospital Stay: Payer: 59

## 2020-07-04 ENCOUNTER — Inpatient Hospital Stay (HOSPITAL_BASED_OUTPATIENT_CLINIC_OR_DEPARTMENT_OTHER): Payer: 59 | Admitting: Nurse Practitioner

## 2020-07-04 DIAGNOSIS — Z171 Estrogen receptor negative status [ER-]: Secondary | ICD-10-CM

## 2020-07-04 DIAGNOSIS — C50112 Malignant neoplasm of central portion of left female breast: Secondary | ICD-10-CM

## 2020-07-04 NOTE — Progress Notes (Signed)
Caitlyn Hendricks did not answer for today's phone follow up prior to chemo. I will see her in the infusion room 07/05/20.   Caitlyn Glad, NP

## 2020-07-05 ENCOUNTER — Inpatient Hospital Stay: Payer: 59

## 2020-07-05 ENCOUNTER — Encounter: Payer: Self-pay | Admitting: Nurse Practitioner

## 2020-07-05 ENCOUNTER — Inpatient Hospital Stay (HOSPITAL_BASED_OUTPATIENT_CLINIC_OR_DEPARTMENT_OTHER): Payer: 59 | Admitting: Nurse Practitioner

## 2020-07-05 ENCOUNTER — Encounter: Payer: Self-pay | Admitting: *Deleted

## 2020-07-05 ENCOUNTER — Other Ambulatory Visit: Payer: Self-pay

## 2020-07-05 VITALS — BP 121/71 | HR 89 | Temp 98.3°F | Resp 16 | Ht 64.0 in | Wt 211.1 lb

## 2020-07-05 DIAGNOSIS — C50112 Malignant neoplasm of central portion of left female breast: Secondary | ICD-10-CM | POA: Diagnosis not present

## 2020-07-05 DIAGNOSIS — Z171 Estrogen receptor negative status [ER-]: Secondary | ICD-10-CM

## 2020-07-05 DIAGNOSIS — Z5111 Encounter for antineoplastic chemotherapy: Secondary | ICD-10-CM | POA: Diagnosis not present

## 2020-07-05 DIAGNOSIS — R21 Rash and other nonspecific skin eruption: Secondary | ICD-10-CM

## 2020-07-05 DIAGNOSIS — Z95828 Presence of other vascular implants and grafts: Secondary | ICD-10-CM

## 2020-07-05 LAB — CBC WITH DIFFERENTIAL (CANCER CENTER ONLY)
Abs Immature Granulocytes: 0 10*3/uL (ref 0.00–0.07)
Basophils Absolute: 0 10*3/uL (ref 0.0–0.1)
Basophils Relative: 1 %
Eosinophils Absolute: 0 10*3/uL (ref 0.0–0.5)
Eosinophils Relative: 1 %
HCT: 27.2 % — ABNORMAL LOW (ref 36.0–46.0)
Hemoglobin: 8.8 g/dL — ABNORMAL LOW (ref 12.0–15.0)
Immature Granulocytes: 0 %
Lymphocytes Relative: 28 %
Lymphs Abs: 0.4 10*3/uL — ABNORMAL LOW (ref 0.7–4.0)
MCH: 29.7 pg (ref 26.0–34.0)
MCHC: 32.4 g/dL (ref 30.0–36.0)
MCV: 91.9 fL (ref 80.0–100.0)
Monocytes Absolute: 0.1 10*3/uL (ref 0.1–1.0)
Monocytes Relative: 7 %
Neutro Abs: 1 10*3/uL — ABNORMAL LOW (ref 1.7–7.7)
Neutrophils Relative %: 63 %
Platelet Count: 160 10*3/uL (ref 150–400)
RBC: 2.96 MIL/uL — ABNORMAL LOW (ref 3.87–5.11)
RDW: 18.6 % — ABNORMAL HIGH (ref 11.5–15.5)
WBC Count: 1.6 10*3/uL — ABNORMAL LOW (ref 4.0–10.5)
nRBC: 0 % (ref 0.0–0.2)

## 2020-07-05 LAB — CMP (CANCER CENTER ONLY)
ALT: 23 U/L (ref 0–44)
AST: 20 U/L (ref 15–41)
Albumin: 3.2 g/dL — ABNORMAL LOW (ref 3.5–5.0)
Alkaline Phosphatase: 112 U/L (ref 38–126)
Anion gap: 5 (ref 5–15)
BUN: 12 mg/dL (ref 6–20)
CO2: 26 mmol/L (ref 22–32)
Calcium: 8.8 mg/dL — ABNORMAL LOW (ref 8.9–10.3)
Chloride: 108 mmol/L (ref 98–111)
Creatinine: 0.66 mg/dL (ref 0.44–1.00)
GFR, Estimated: >60 mL/min (ref >60)
Glucose, Bld: 106 mg/dL — ABNORMAL HIGH (ref 70–99)
Potassium: 4.3 mmol/L (ref 3.5–5.1)
Sodium: 139 mmol/L (ref 135–145)
Total Bilirubin: 0.6 mg/dL (ref 0.3–1.2)
Total Protein: 6.2 g/dL — ABNORMAL LOW (ref 6.5–8.1)

## 2020-07-05 LAB — TSH: TSH: 0.98 u[IU]/mL (ref 0.308–3.960)

## 2020-07-05 MED ORDER — FUROSEMIDE 10 MG/ML IJ SOLN
20.0000 mg | Freq: Once | INTRAMUSCULAR | Status: AC
  Administered 2020-07-05: 09:00:00 20 mg via INTRAVENOUS

## 2020-07-05 MED ORDER — METHYLPREDNISOLONE SODIUM SUCC 40 MG IJ SOLR
INTRAMUSCULAR | Status: AC
  Filled 2020-07-05: qty 1

## 2020-07-05 MED ORDER — METHYLPREDNISOLONE SODIUM SUCC 40 MG IJ SOLR
20.0000 mg | Freq: Once | INTRAMUSCULAR | Status: AC
  Administered 2020-07-05: 09:00:00 20 mg via INTRAVENOUS

## 2020-07-05 MED ORDER — FAMOTIDINE IN NACL 20-0.9 MG/50ML-% IV SOLN
20.0000 mg | Freq: Once | INTRAVENOUS | Status: AC
  Administered 2020-07-05: 10:00:00 20 mg via INTRAVENOUS

## 2020-07-05 MED ORDER — SODIUM CHLORIDE 0.9% FLUSH
10.0000 mL | Freq: Once | INTRAVENOUS | Status: AC
  Administered 2020-07-05: 10:00:00 10 mL
  Filled 2020-07-05: qty 10

## 2020-07-05 MED ORDER — TRAMADOL HCL 50 MG PO TABS: 50.0000 mg | ORAL_TABLET | Freq: Every day | ORAL | 0 refills | Status: DC | PRN

## 2020-07-05 MED ORDER — FUROSEMIDE 10 MG/ML IJ SOLN
INTRAMUSCULAR | Status: AC
  Filled 2020-07-05: qty 2

## 2020-07-05 MED ORDER — HEPARIN SOD (PORK) LOCK FLUSH 100 UNIT/ML IV SOLN
500.0000 [IU] | Freq: Once | INTRAVENOUS | Status: AC
  Administered 2020-07-05: 10:00:00 500 [IU]
  Filled 2020-07-05: qty 5

## 2020-07-05 MED ORDER — SODIUM CHLORIDE 0.9 % IV SOLN
Freq: Once | INTRAVENOUS | Status: AC
  Filled 2020-07-05: qty 250

## 2020-07-05 MED ORDER — DIPHENHYDRAMINE HCL 50 MG/ML IJ SOLN
50.0000 mg | Freq: Once | INTRAMUSCULAR | Status: AC
  Administered 2020-07-05: 09:00:00 50 mg via INTRAVENOUS

## 2020-07-05 MED ORDER — FAMOTIDINE IN NACL 20-0.9 MG/50ML-% IV SOLN
INTRAVENOUS | Status: AC
  Filled 2020-07-05: qty 50

## 2020-07-05 MED ORDER — DIPHENHYDRAMINE HCL 50 MG/ML IJ SOLN
INTRAMUSCULAR | Status: AC
  Filled 2020-07-05: qty 1

## 2020-07-05 NOTE — Progress Notes (Signed)
[place]   Telephone:(336) [phone] Fax:(336) [phone]   Clinic Follow up Note   Patient Care Team: [name], MD as PCP - General (Family Medicine) Mauro Kaufmann, RN as Oncology Nurse Navigator Rockwell Germany, RN as Oncology Nurse Navigator [name], MD as Consulting Physician (General Surgery) Truitt Merle, MD as Consulting Physician (Hematology) 07/05/2020  CHIEF COMPLAINT: generalized swelling, itching; f/up left breast cancer   SUMMARY OF ONCOLOGIC HISTORY: Oncology History Overview Note  Cancer Staging Cancer of central portion of left female breast [facility]) Staging form: Breast, AJCC 8th Edition - Clinical: No stage assigned - Unsigned    Cancer of central portion of left female breast ([place])  02/23/2020 Mammogram   Diagnostic Mammogram 02/23/20  IMPRESSION The 2x1x2.6cm irregular mass in teh left breast at 12:00 posiiton middle depth is highly suspicious of malignancy. An [place] is recommended for further evaluation and biopsy planning purposes.   02/23/2020 Breast US   [name] 02/23/20  IMPRESSION 2 adjacent spiculated masses in the left brast at 12:00 position 3 cm from the nipple (2.1x0.9x1.1cm and 1.1x1.4x0.5cm) is suggestive of malignancy.   Multiple abnormal left subpectoral and left axillary nodes measuring 3.3x2.1 cm concerning for metastatic adenopathy.    Left breast skin thickening and edema may be secondary congestive edema due to extensive axillary adenopathy.    03/08/2020 Initial Biopsy   Diagnosis 1.Breast, left, needle core biopsy, 12:00 position, 3cmfn -INVASIVE DUCTAL CARCINOMA -SEE COMMENT  2. Lymph node, needle/core biopsy, left axilla -METASTATIC CARCINOMA INVOLVING A LYMPH NODE  -LYMPHOVASCULAR SPACE INVASION PRESENT   Microscopic Comment  1.Based on the biopsy the carcinoma appears Nottingham Grade 3 or 3 and measures 1 cm in the greatest linear extent.    03/08/2020 Receptors her2   ER- Negative 0% PR -  Negative 0% HER2 - Negative  KI 67 - 80%    03/08/2020 Cancer Staging   Staging form: Breast, AJCC 8th Edition - Clinical stage from 03/08/2020: Stage IIIB (cT2, cN1, cM0, G3, ER-, PR-, HER2-) - Signed by Truitt Merle, MD on 03/10/2020   03/10/2020 Initial Diagnosis   Cancer of central portion of left female breast ([place])   03/16/2020 Breast MRI   IMPRESSION: 1. 8.1 x 7.8 x 6.6 cm biopsy proven invasive ductal carcinoma in the central right breast, involving 3 quadrants. 2. 3.0 x 1.7 x 1.1 cm satellite mass more inferiorly in lower inner quadrant of the left breast, compatible with additional malignancy. 3. Metastatic level 1 and level 2 left axillary lymph nodes. 4. No evidence of malignancy on the right.   03/17/2020 Imaging   IMPRESSION: CT CAP w contrast  1. Diffuse skin thickening in the left breast with left axillary and subpectoral lymphadenopathy, as well as a mildly enlarged left supraclavicular lymph node, which likely represents metastatic lymphadenopathy. No other definite extra nodal metastatic disease noted elsewhere in the chest, abdomen or pelvis. 2. Large mass in the central anatomic pelvis which is of uncertain origin, potentially a large exophytic fibroid or a large solid mass arising from the right ovary. Further evaluation with pelvic ultrasound is strongly recommended.   03/21/2020 Imaging   Bone Scan  IMPRESSION: Apparent arthropathy at L5. No bony metastatic disease is demonstrable on this study. Scattered foci of abnormal uptake in a pelvic mass is of uncertain etiology given absence of calcification in this mass by CT. This mass compresses the urinary bladder. It is possible that some of the increased uptake in this area actually  represents physiologic uptake within the bladder.   Kidneys noted in flank positions bilaterally.     03/22/2020 -  Neo-Adjuvant Chemotherapy   Neoadjuvant Adriamycin and Cytoxan q2weeks for 4 cycles starting 03/22/20-05/03/20 followed  by weekly Taxol and Carboplatin for 12 weeks starting 05/17/20   03/24/2020 Imaging   US Pelvis  IMPRESSION: 1. Large pedunculated lesion directly contiguous with the uterine most suggestive of a large subserosal fibroid which measures up to 8.2 cm. 2. Additional 1 cm probable intramural fibroid in the right anterior uterine body. 3. No other acute or worrisome pelvic abnormality.   04/28/2020 -  Antibody Plan   Added Keytruda q3weeks starting 04/28/20 to complete 1 year of treatment      CURRENT THERAPY:  Neoadjuvant chemo ddACq2weeks for 4 cyclesstarting 03/22/20-05/03/20, followed by weekly carbo/taxolfor 12 weeksstarting 05/17/20. -Added Keytruda q3weeks to complete 1 year of treatmenton10/22/21  INTERVAL HISTORY: Ms. Truby was seen in the infusion room prior to week 8 carbo/taxol. Since last visit she has new generalized swelling from head to toe, itching scalp and eyes, swollen tongue and scratchy throat. She has progressive dyspnea on minimal exertion. She has 9 lb weight gain in a week. She recently traveled 2 hours by car over the holiday. Ate 2 cooked oysters which she has had before. No new meds or products. She did not add salt to her food. She took 3 benadryl last night for her symptoms and went to sleep.   Denies fever, chills, cough, chest pain, n/vc/d, neuropathy.   MEDICAL HISTORY:  Past Medical History:  Diagnosis Date  . Anxiety   . Depression     SURGICAL HISTORY: Past Surgical History:  Procedure Laterality Date  . PORTACATH PLACEMENT Right 03/21/2020   Procedure: INSERTION PORT-A-CATH WITH ULTRASOUND GUIDANCE;  Surgeon: [name], MD;  Location: [place];  Service: General;  Laterality: Right;    I have reviewed the social history and family history with the patient and they are unchanged from previous note.  ALLERGIES:  is allergic to medroxyprogesterone.  MEDICATIONS:  Current Outpatient Medications   Medication Sig Dispense Refill  . ALPRAZolam (XANAX) 0.5 MG tablet Take 0.5 mg by mouth 3 (three) times daily.    Marland Kitchen ALPRAZolam (XANAX) 1 MG tablet Take 1 mg by mouth 3 (three) times daily as needed for anxiety.     Marland Kitchen azithromycin (ZITHROMAX) 250 MG tablet Take as prescribed 6 each 0  . baclofen (LIORESAL) 10 MG tablet Take 1 tablet (10 mg total) by mouth 2 (two) times daily as needed for muscle spasms. 30 each 0  . citalopram (CELEXA) 20 MG tablet Take 20 mg by mouth daily.    Marland Kitchen dexamethasone (DECADRON) 4 MG tablet Take 2 tablets by mouth daily starting the day after Carboplatin and Cytoxan x 3 days. Take with food. (Patient not taking: Reported on 06/21/2020) 30 tablet 1  . fluconazole (DIFLUCAN) 150 MG tablet Take 1 tablet for a yeast infection if needed, may repeat in 3 days if needed Use if needed while on antibiotic. 2 tablet 0  . fluticasone (FLONASE) 50 MCG/ACT nasal spray Place 2 sprays into both nostrils daily for 7 days. 15.8 mL 0  . gabapentin (NEURONTIN) 100 MG capsule Take 1 capsule (100 mg total) by mouth 3 (three) times daily. May increase to 360m three times daily as need in a few weeks if tolerates well 90 capsule 0  . lidocaine-prilocaine (EMLA) cream Apply to affected area once 30 g 3  . methylPREDNISolone (  MEDROL DOSEPAK) 4 MG TBPK tablet Take 1 tablet (4 mg total) by mouth taper from 4 doses each day to 1 dose and stop. 1 each 0  . omeprazole (PRILOSEC) 20 MG capsule Take 1 capsule (20 mg total) by mouth 2 (two) times daily before a meal. 60 capsule 2  . ondansetron (ZOFRAN) 8 MG tablet Take 1 tablet (8 mg total) by mouth 2 (two) times daily as needed. Start on the third day after chemotherapy. (Patient not taking: Reported on 06/21/2020) 30 tablet 2  . prochlorperazine (COMPAZINE) 10 MG tablet Take 1 tablet (10 mg total) by mouth every 6 (six) hours as needed (Nausea or vomiting). 30 tablet 2  . sertraline (ZOLOFT) 50 MG tablet Take by mouth.    . traMADol (ULTRAM) 50 MG  tablet TAKE 1 TABLET (50 MG TOTAL) BY MOUTH DAILY AS NEEDED FOR SEVERE PAIN (HEADACHE). 30 tablet 0  . triamcinolone ointment (KENALOG) 0.5 % Apply 1 application topically 2 (two) times daily. 30 g 2  . zolpidem (AMBIEN) 10 MG tablet Take 1 tablet (10 mg total) by mouth at bedtime as needed for sleep. 30 tablet 1   No current facility-administered medications for this visit.   Facility-Administered Medications Ordered in Other Visits  Medication Dose Route Frequency Provider Last Rate Last Admin  . sodium chloride flush (NS) 0.9 % injection 10 mL  10 mL Intravenous PRN Alla Feeling, NP   10 mL at 03/28/20 1104    PHYSICAL EXAMINATION: ECOG PERFORMANCE STATUS: 1 - Symptomatic but completely ambulatory  Per infusion room flow sheet BP 121/71 HR 89 RR 16  T 98.3 Wt 211 lbs   GENERAL: alert, no distress and comfortable SKIN: scattered maculopapular eruptions to face and scalp  EYES:  sclera clear OROPHARYNX: no thrush or ulcers NECK: without swelling  LUNGS: clear with normal breathing effort HEART: regular rate & rhythm, generalized edema   NEURO: alert & oriented x 3 with fluent speech, no focal motor/sensory deficits PAC without erythema    LABORATORY DATA:  I have reviewed the data as listed CBC Latest Ref Rng & Units 07/05/2020 06/28/2020 06/21/2020  WBC 4.0 - 10.5 K/uL 1.6(L) 2.4(L) 3.0(L)  Hemoglobin 12.0 - 15.0 g/dL 8.8(L) 9.7(L) 9.3(L)  Hematocrit 36.0 - 46.0 % 27.2(L) 30.5(L) 28.7(L)  Platelets 150 - 400 K/uL 160 211 199     CMP Latest Ref Rng & Units 07/05/2020 06/28/2020 06/21/2020  Glucose 70 - 99 mg/dL 106(H) 98 91  BUN 6 - 20 mg/dL 12 18 15   Creatinine 0.44 - 1.00 mg/dL 0.66 0.75 0.78  Sodium 135 - 145 mmol/L 139 138 138  Potassium 3.5 - 5.1 mmol/L 4.3 4.2 3.9  Chloride 98 - 111 mmol/L 108 106 105  CO2 22 - 32 mmol/L 26 24 26   Calcium 8.9 - 10.3 mg/dL 8.8(L) 9.4 9.2  Total Protein 6.5 - 8.1 g/dL 6.2(L) 7.2 6.8  Total Bilirubin 0.3 - 1.2 mg/dL 0.6 0.5 0.4   Alkaline Phos 38 - 126 U/L 112 117 115  AST 15 - 41 U/L 20 37 32  ALT 0 - 44 U/L 23 45(H) 28      RADIOGRAPHIC STUDIES: I have personally reviewed the radiological images as listed and agreed with the findings in the report. No results found.   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 Her ER/PR/HER2 markers were all negative.Staging work-up negative for distant metastasis -To downstage her disease and reduce recurrence risk she started neoadjuvant chemo with ddAC every 2 weeks for 4 cycles 03/22/2020-05/03/2020,she started weekly carbo/Taxol for 12 weeks starting 05/17/2020 -Based on recently published keynote 522 trial data,pembrolizumab q3 weeks was added, for a total of 1 year, starting 04/28/2020 -Baseline echo was normal, EF 60-65%  2. Symptom management: Headache, constipation,fatigue, hot flash, body aches -With cycle 1 AC;headaches likely related to chemo vs steroids vs anti-emetics. Improved with IVF and tramadol in clinic -Avoiding steroids now that she is on Keytruda -she developed body aches and hot flashes after starting carbo/taxol. HF much improved with gabapentin and body aches resolved after week 5 and starting gabapentin  3. Genetic Testing -Given her young age, Her MGM's breast cancer and father prostate cancer, she is eligible for genetic testing.  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 Anxiety/Depression, Social Support  -She is currently on Xanax 6m TID and Celexa 537m This is  managed by her PCP. -She has been more anxious with her cancer diagnosis and would like dose increase. -She has been referred to social work, and CuTeacher, English as a foreign languageShe owns her DoDevelopment worker, international aidnd notes she does not plan to work during this treatment time.She was given our financial advocate's contact information  5. Right breast pain -developed ~ 05/08/20, no obvious injury or strain, but possibly related to MSK - overuse (ball throwing) or strain vs sleep position.  -exam is benign; continue supportive care with heat, NSAID, baclofen PRN  -not mentioned today   6. Edema, pruritic rash, 9 lbs weight gain in 1 week -recent travel, ate cooked oysters, no new meds/products  -generalized edema, rash, tight throat,  -etiology is unclear -will obtain echo given 9 lbs weight gain  -received benadryl, pepcid, solumedrol, and lasix    Dispo: Ms. PrMcerleanppears stable. She completed cycle 7 weekly carbo/taxol and continues q3 week pembrolizumab. She is tolerating treatment well overall.   She appears to be having some type of hypersensitivity reaction. Due to edema and 9 lb weight gain in 1 week I am referring her for echo to r/o cardiac etiology. Will hold chemo today. She received solumedrol, benadryl, pepcid, and lasix in clinic. Will avoid steroid taper while on immunotherapy.   Labs reviewed, anemia slightly worse, likely contributing to dyspnea. ANC 1.0. We are getting granix approved for the future if needed. CMP stable.   I will see her back next week to monitor her symptoms and hopefully resume treatment. She knows to go to ED if symptoms acutely worsen.     Orders Placed This Encounter  Procedures  . ECHOCARDIOGRAM COMPLETE    Standing Status:   Future    Standing Expiration Date:   07/05/2021    Order Specific Question:   Where should this [place]    [place]  Order Specific Question:   Perflutren DEFINITY (image enhancing agent) should be administered  unless hypersensitivity or allergy exist    Answer:   Administer Perflutren    Order Specific Question:   Reason for exam-Echo    Answer:   Chemo  Z09    Order Specific Question:  Other Comments    Answer:   9 lbs weight gain 1 week, dyspnea, h/o anthracycline chemo r/o CHF   All questions were answered. The patient knows to call the clinic with any problems, questions or concerns. No barriers to learning were detected. Total encounter time was 30 minutes.      Alla Feeling, NP 07/05/20

## 2020-07-05 NOTE — Progress Notes (Signed)
Per Santiago Glad, NP - patient not receiving chemotherapy treatment today. Patient will receive medications to help with skin rash and edema. Patient aware and agreeable to the change in treatment plan. Patient educated on neutropenic precautions and to seek medical assistance if symptoms progress. Patient verbalized an understanding of the education.

## 2020-07-05 NOTE — Patient Instructions (Signed)
[facility] Discharge Instructions for Patients Receiving Chemotherapy  Today you received the following chemotherapy agents: Paclitaxel (Taxol) and Carboplatin  To help prevent nausea and vomiting after your treatment, we encourage you to take your nausea medication  as prescribed.    If you develop nausea and vomiting that is not controlled by your nausea medication, call the clinic.   BELOW ARE SYMPTOMS THAT SHOULD BE REPORTED IMMEDIATELY:  *FEVER GREATER THAN 100.5 F  *CHILLS WITH OR WITHOUT FEVER  NAUSEA AND VOMITING THAT IS NOT CONTROLLED WITH YOUR NAUSEA MEDICATION  *UNUSUAL SHORTNESS OF BREATH  *UNUSUAL BRUISING OR BLEEDING  TENDERNESS IN MOUTH AND THROAT WITH OR WITHOUT PRESENCE OF ULCERS  *URINARY PROBLEMS  *BOWEL PROBLEMS  UNUSUAL RASH Items with * indicate a potential emergency and should be followed up as soon as possible.  Feel free to call the clinic should you have any questions or concerns. The clinic phone number is [phone].  Please show the CHEMO ALERT CARD at check-in to the Emergency Department and triage nurse.   

## 2020-07-06 LAB — T4: T4, Total: 4.2 ug/dL — ABNORMAL LOW (ref 4.5–12.0)

## 2020-07-10 ENCOUNTER — Telehealth: Payer: Self-pay

## 2020-07-10 NOTE — Telephone Encounter (Signed)
-----   Message from Pollyann Samples, NP sent at 07/10/2020  8:39 AM EST ----- Corinna Capra, or Steph,  Could one of you please help to get echo done today or tomorrow before she comes in this week. I ordered asap on 12/29. She has swelling, shortness of breath, and 9 lbs weight gain in 1 week with h/o anthracycline chemo.   Thanks, Clayborn Heron  ----- Message ----- From: Malachy Mood, MD Sent: 07/08/2020  10:41 AM EST To: Pollyann Samples, NP, Susanne Greenhouse, LPN, #  My scheduled 9:00 pt may or may not come,OK to double book with me at 9am for this pt   Thanks  Terrace Arabia  ----- Message ----- From: Shirlee More Sent: 07/06/2020   3:32 PM EST To: Pollyann Samples, NP, Malachy Mood, MD, #  The only thing is the patient's infusion is scheduled at 9:30 that day. Because infusion's so booked I don't know if they'll let me move her infusion.  ----- Message ----- From: Susanne Greenhouse, LPN Sent: [id number]   3:29 PM EST To: Pollyann Samples, NP, Malachy Mood, MD, #  Lacie can you see her at 74 on the 5th theres a break between 915 and 11  ----- Message ----- From: Shirlee More Sent: 07/06/2020   9:50 AM EST To: Pollyann Samples, NP, Malachy Mood, MD, #  Check out comments: Add follow up LB/YF on 1/5 before chemo   Hi Lacie,  I don't see any open slots on either your schedule or Dr. Latanya Maudlin for 1/5. Is there a time I could add this patient to one of your schedules?  Thanks [employer]

## 2020-07-10 NOTE — Progress Notes (Signed)
[place]   Telephone:([phone]) [phone] Fax:(336) [phone]   Clinic Follow up Note   Patient Care Team: [name], MD as PCP - General (Family Medicine) Mauro Kaufmann, RN as Oncology Nurse Navigator Rockwell Germany, RN as Oncology Nurse Navigator [name], MD as Consulting Physician (General Surgery) Truitt Merle, MD as Consulting Physician (Hematology)  Date of Service:  07/12/2020  CHIEF COMPLAINT: F/u of left breast cancer  SUMMARY OF ONCOLOGIC HISTORY: Oncology History Overview Note  Cancer Staging Cancer of central portion of left female breast [facility]) Staging form: Breast, AJCC 8th Edition - Clinical: No stage assigned - Unsigned    Cancer of central portion of left female breast ([place])  02/23/2020 Mammogram   Diagnostic Mammogram 02/23/20  IMPRESSION The 2x1x2.6cm irregular mass in teh left breast at 12:00 posiiton middle depth is highly suspicious of malignancy. An [place] is recommended for further evaluation and biopsy planning purposes.   02/23/2020 Breast US   [name] 02/23/20  IMPRESSION 2 adjacent spiculated masses in the left brast at 12:00 position 3 cm from the nipple (2.1x0.9x1.1cm and 1.1x1.4x0.5cm) is suggestive of malignancy.   Multiple abnormal left subpectoral and left axillary nodes measuring 3.3x2.1 cm concerning for metastatic adenopathy.    Left breast skin thickening and edema may be secondary congestive edema due to extensive axillary adenopathy.    03/08/2020 Initial Biopsy   Diagnosis 1.Breast, left, needle core biopsy, 12:00 position, 3cmfn -INVASIVE DUCTAL CARCINOMA -SEE COMMENT  2. Lymph node, needle/core biopsy, left axilla -METASTATIC CARCINOMA INVOLVING A LYMPH NODE  -LYMPHOVASCULAR SPACE INVASION PRESENT   Microscopic Comment  1.Based on the biopsy the carcinoma appears Nottingham Grade 3 or 3 and measures 1 cm in the greatest linear extent.    03/08/2020 Receptors her2   ER- Negative 0% PR - Negative  0% HER2 - Negative  KI 67 - 80%    03/08/2020 Cancer Staging   Staging form: Breast, AJCC 8th Edition - Clinical stage from 03/08/2020: Stage IIIB (cT2, cN1, cM0, G3, ER-, PR-, HER2-) - Signed by Truitt Merle, MD on 03/10/2020   03/10/2020 Initial Diagnosis   Cancer of central portion of left female breast ([place])   03/16/2020 Breast MRI   IMPRESSION: 1. 8.1 x 7.8 x 6.6 cm biopsy proven invasive ductal carcinoma in the central right breast, involving 3 quadrants. 2. 3.0 x 1.7 x 1.1 cm satellite mass more inferiorly in lower inner quadrant of the left breast, compatible with additional malignancy. 3. Metastatic level 1 and level 2 left axillary lymph nodes. 4. No evidence of malignancy on the right.   03/17/2020 Imaging   IMPRESSION: CT CAP w contrast  1. Diffuse skin thickening in the left breast with left axillary and subpectoral lymphadenopathy, as well as a mildly enlarged left supraclavicular lymph node, which likely represents metastatic lymphadenopathy. No other definite extra nodal metastatic disease noted elsewhere in the chest, abdomen or pelvis. 2. Large mass in the central anatomic pelvis which is of uncertain origin, potentially a large exophytic fibroid or a large solid mass arising from the right ovary. Further evaluation with pelvic ultrasound is strongly recommended.   03/21/2020 Imaging   Bone Scan  IMPRESSION: Apparent arthropathy at L5. No bony metastatic disease is demonstrable on this study. Scattered foci of abnormal uptake in a pelvic mass is of uncertain etiology given absence of calcification in this mass by CT. This mass compresses the urinary bladder. It is possible that some of the increased uptake in this  area actually represents physiologic uptake within the bladder.   Kidneys noted in flank positions bilaterally.     03/22/2020 -  Neo-Adjuvant Chemotherapy   Neoadjuvant Adriamycin and Cytoxan q2weeks for 4 cycles starting 03/22/20-05/03/20 followed by  weekly Taxol and Carboplatin for 12 weeks starting 05/17/20   03/24/2020 Imaging   US Pelvis  IMPRESSION: 1. Large pedunculated lesion directly contiguous with the uterine most suggestive of a large subserosal fibroid which measures up to 8.2 cm. 2. Additional 1 cm probable intramural fibroid in the right anterior uterine body. 3. No other acute or worrisome pelvic abnormality.   04/28/2020 -  Antibody Plan   Added Keytruda q3weeks starting 04/28/20 to complete 1 year of treatment       CURRENT THERAPY:  Neoadjuvant chemo ddACq2weeks for 4 cyclesstarting 03/22/20-05/03/20, followed by weekly carbo/taxolfor 12 weeksstarting 05/17/20. -Added Keytruda q3weeks to complete 1 year of treatmenton10/22/21  INTERVAL HISTORY:  Caitlyn Hendricks is here for a follow up. She presents to the clinic with her mother. She notes she did not have chemo CT last week due to swelling of her hands, lower and upper extremity and face. After 2 days this has resolved. She notes sometimes she feels itching of her eyes. She also notes mild sore on her tongue and notes pain of tongue when she eats. She notes she tried magic mouthwash but has too much numbness which leads to drooling. She notes starting to have more muscle aches and tingling of fingertips. She has been using ice bags during chemo infusions. She will use Baclofen 3 times a week.     REVIEW OF SYSTEMS:   Constitutional: Denies fevers, chills or abnormal weight loss Eyes: Denies blurriness of vision Ears, nose, mouth, throat, and face: Denies mucositis or sore throat Respiratory: Denies cough, dyspnea or wheezes Cardiovascular: Denies palpitation, chest discomfort or lower extremity swelling Gastrointestinal:  Denies nausea, heartburn or change in bowel habits Skin: Denies abnormal skin rashes MSK: (+)Muscle tightness  Lymphatics: Denies new lymphadenopathy or easy bruising Neurological: (+) mild tingling of fingertips   Behavioral/Psych: Mood is stable, no new changes  All other systems were reviewed with the patient and are negative.  MEDICAL HISTORY:  Past Medical History:  Diagnosis Date  . Anxiety   . Depression     SURGICAL HISTORY: Past Surgical History:  Procedure Laterality Date  . PORTACATH PLACEMENT Right 03/21/2020   Procedure: INSERTION PORT-A-CATH WITH ULTRASOUND GUIDANCE;  Surgeon: [name], MD;  Location: [place];  Service: General;  Laterality: Right;    I have reviewed the social history and family history with the patient and they are unchanged from previous note.  ALLERGIES:  is allergic to medroxyprogesterone.  MEDICATIONS:  Current Outpatient Medications  Medication Sig Dispense Refill  . furosemide (LASIX) 20 MG tablet Take 1 tablet (20 mg total) by mouth daily as needed for edema. 10 tablet 0  . ALPRAZolam (XANAX) 0.5 MG tablet Take 0.5 mg by mouth 3 (three) times daily.    Marland Kitchen ALPRAZolam (XANAX) 1 MG tablet Take 1 mg by mouth 3 (three) times daily as needed for anxiety.     Marland Kitchen azithromycin (ZITHROMAX) 250 MG tablet Take as prescribed 6 each 0  . baclofen (LIORESAL) 10 MG tablet Take 1 tablet (10 mg total) by mouth 2 (two) times daily as needed for muscle spasms. 30 each 0  . citalopram (CELEXA) 20 MG tablet Take 20 mg by mouth daily.    Marland Kitchen dexamethasone (DECADRON) 4 MG tablet Take  2 tablets by mouth daily starting the day after Carboplatin and Cytoxan x 3 days. Take with food. (Patient not taking: Reported on 06/21/2020) 30 tablet 1  . fluconazole (DIFLUCAN) 150 MG tablet Take 1 tablet for a yeast infection if needed, may repeat in 3 days if needed Use if needed while on antibiotic. 2 tablet 0  . fluticasone (FLONASE) 50 MCG/ACT nasal spray Place 2 sprays into both nostrils daily for 7 days. 15.8 mL 0  . gabapentin (NEURONTIN) 100 MG capsule Take 1 capsule (100 mg total) by mouth 3 (three) times daily. May increase to 347m three times daily as need  in a few weeks if tolerates well 90 capsule 0  . lidocaine-prilocaine (EMLA) cream Apply to affected area once 30 g 3  . methylPREDNISolone (MEDROL DOSEPAK) 4 MG TBPK tablet Take 1 tablet (4 mg total) by mouth taper from 4 doses each day to 1 dose and stop. 1 each 0  . omeprazole (PRILOSEC) 20 MG capsule Take 1 capsule (20 mg total) by mouth 2 (two) times daily before a meal. 60 capsule 2  . ondansetron (ZOFRAN) 8 MG tablet Take 1 tablet (8 mg total) by mouth 2 (two) times daily as needed. Start on the third day after chemotherapy. (Patient not taking: Reported on 06/21/2020) 30 tablet 2  . prochlorperazine (COMPAZINE) 10 MG tablet Take 1 tablet (10 mg total) by mouth every 6 (six) hours as needed (Nausea or vomiting). 30 tablet 2  . sertraline (ZOLOFT) 50 MG tablet Take by mouth.    . traMADol (ULTRAM) 50 MG tablet Take 1 tablet (50 mg total) by mouth daily as needed for severe pain (headache). 30 tablet 0  . triamcinolone ointment (KENALOG) 0.5 % Apply 1 application topically 2 (two) times daily. 30 g 2  . zolpidem (AMBIEN) 10 MG tablet Take 1 tablet (10 mg total) by mouth at bedtime as needed for sleep. 30 tablet 1   No current facility-administered medications for this visit.   Facility-Administered Medications Ordered in Other Visits  Medication Dose Route Frequency Provider Last Rate Last Admin  . sodium chloride flush (NS) 0.9 % injection 10 mL  10 mL Intravenous PRN BAlla Feeling NP   10 mL at 03/28/20 1104    PHYSICAL EXAMINATION: ECOG PERFORMANCE STATUS: 2 - Symptomatic, <50% confined to bed  Vitals:   07/12/20 0905  BP: 115/82  Pulse: (!) 101  Resp: 18  Temp: 98.4 F (36.9 C)  SpO2: 100%   Filed Weights   07/12/20 0905  Weight: 205 lb 6.4 oz (93.2 kg)    GENERAL:alert, no distress and comfortable SKIN: skin color, texture, turgor are normal, no rashes or significant lesions EYES: normal, Conjunctiva are pink and non-injected, sclera clear  NECK: supple, thyroid  normal size, non-tender, without nodularity LYMPH:  no palpable lymphadenopathy in the cervical, axillary  LUNGS: clear to auscultation and percussion with normal breathing effort HEART: regular rate & rhythm and no murmurs and no lower extremity edema ABDOMEN:abdomen soft, non-tender and normal bowel sounds Musculoskeletal:no cyanosis of digits and no clubbing  NEURO: alert & oriented x 3 with fluent speech (+) very mild decrease of sensory deficits in her hands R>L  LABORATORY DATA:  I have reviewed the data as listed CBC Latest Ref Rng & Units 07/12/2020 07/05/2020 06/28/2020  WBC 4.0 - 10.5 K/uL 2.2(L) 1.6(L) 2.4(L)  Hemoglobin 12.0 - 15.0 g/dL 10.0(L) 8.8(L) 9.7(L)  Hematocrit 36.0 - 46.0 % 30.8(L) 27.2(L) 30.5(L)  Platelets 150 - 400  K/uL 255 160 211     CMP Latest Ref Rng & Units 07/12/2020 07/05/2020 06/28/2020  Glucose 70 - 99 mg/dL 109(H) 106(H) 98  BUN 6 - 20 mg/dL _0 Creatinine 0.44 - 1.00 mg/dL 0.77 0.66 0.75  Sodium 135 - 145 mmol/L 139 139 138  Potassium 3.5 - 5.1 mmol/L 3.9 4.3 4.2  Chloride 98 - 111 mmol/L 105 108 106  CO2 22 - 32 mmol/L _1 Calcium 8.9 - 10.3 mg/dL 9.5 8.8(L) 9.4  Total Protein 6.5 - 8.1 g/dL 7.2 6.2(L) 7.2  Total Bilirubin 0.3 - 1.2 mg/dL 0.5 0.6 0.5  Alkaline Phos 38 - 126 U/L 129(H) 112 117  AST 15 - 41 U/L 39 20 37  ALT 0 - 44 U/L 53(H) 23 45(H)      RADIOGRAPHIC STUDIES: I have personally reviewed the radiological images as listed and agreed with the findings in the report. ECHOCARDIOGRAM COMPLETE  Result Date: 07/11/2020    ECHOCARDIOGRAM REPORT   Patient Name:   Caitlyn Hendricks Date of Exam: 07/11/2020 Medical Rec #:  [id number]  Height:       64.0 in Accession #:    [id number] Weight:       211.1 lb Date of Birth:  Feb 05, 1981  BSA:          2.002 m Patient Age:    16 years   BP:           121/71 mmHg Patient Gender: F          HR:           105 bpm. Exam Location:  Outpatient Procedure: 2D Echo, Cardiac Doppler, Color Doppler, Strain  Analysis and 3D Echo Indications:    Z51.11 Encounter for antineoplastic chemotheraphy  History:        Patient has prior history of Echocardiogram examinations, most                 recent 03/20/2020. Breast cancer.  Sonographer:    Jonelle Sidle Dance Referring Phys: [id number] [place]  1. Left ventricular ejection fraction, by estimation, is 60 to 65%. The left ventricle has normal function. The left ventricle has no regional wall motion abnormalities. Left ventricular diastolic parameters are indeterminate. The average left ventricular global longitudinal strain is -13.8 %. Global longitudinal strain is reduced from prior echo but appears to be poor tracking of the endocardium due to poor image quality in apical views.  2. Right ventricular systolic function is normal. The right ventricular size is normal. Tricuspid regurgitation signal is inadequate for assessing PA pressure.  3. A small pericardial effusion is present adjacent to RV free wall  4. The mitral valve is normal in structure. No evidence of mitral valve regurgitation.  5. The aortic valve was not well visualized. Aortic valve regurgitation is trivial. No aortic stenosis is present.  6. The inferior vena cava is normal in size with greater than 50% respiratory variability, suggesting right atrial pressure of 3 mmHg. FINDINGS  Left Ventricle: Left ventricular ejection fraction, by estimation, is 60 to 65%. The left ventricle has normal function. The left ventricle has no regional wall motion abnormalities. The average left ventricular global longitudinal strain is -13.8 %. The left ventricular internal cavity size was small. There is no left ventricular hypertrophy. Left ventricular diastolic parameters are indeterminate. Right Ventricle: The right ventricular size is normal. No increase in right ventricular wall thickness. Right ventricular systolic function is normal. Tricuspid regurgitation signal  is inadequate for assessing PA pressure.  Left Atrium: Left atrial size was normal in size. Right Atrium: Right atrial size was normal in size. Pericardium: A small pericardial effusion is present. Mitral Valve: The mitral valve is normal in structure. No evidence of mitral valve regurgitation. Tricuspid Valve: The tricuspid valve is normal in structure. Tricuspid valve regurgitation is not demonstrated. Aortic Valve: The aortic valve was not well visualized. Aortic valve regurgitation is trivial. No aortic stenosis is present. Pulmonic Valve: The pulmonic valve was grossly normal. Pulmonic valve regurgitation is trivial. Aorta: The aortic root and ascending aorta are structurally normal, with no evidence of dilitation. Venous: The inferior vena cava is normal in size with greater than 50% respiratory variability, suggesting right atrial pressure of 3 mmHg. IAS/Shunts: The interatrial septum was not well visualized.  LEFT VENTRICLE PLAX 2D LVIDd:         3.50 cm  Diastology LVIDs:         2.70 cm  LV e' lateral:   7.72 cm/s LV PW:         1.00 cm  LV E/e' lateral: 14.1 LV IVS:        0.90 cm LVOT diam:     1.70 cm  2D Longitudinal Strain LV SV:         30       2D Strain GLS Avg:     -13.8 % LV SV Index:   15 LVOT Area:     2.27 cm                          3D Volume EF:                         3D EF:        46 %                         LV EDV:       136 ml                         LV ESV:       74 ml                         LV SV:        63 ml RIGHT VENTRICLE             IVC RV Basal diam:  2.40 cm     IVC diam: 1.30 cm RV S prime:     12.40 cm/s TAPSE (M-mode): 1.8 cm LEFT ATRIUM             Index       RIGHT ATRIUM          Index LA diam:        3.20 cm 1.60 cm/m  RA Area:     9.84 cm LA Vol (A2C):   28.1 ml 14.04 ml/m RA Volume:   18.40 ml 9.19 ml/m LA Vol (A4C):   10.3 ml 5.15 ml/m LA Biplane Vol: 17.7 ml 8.84 ml/m  AORTIC VALVE LVOT Vmax:   83.90 cm/s LVOT Vmean:  53.800 cm/s LVOT VTI:    0.132 m  AORTA Ao Root diam: 2.80 cm Ao Asc diam:  3.50 cm  MITRAL VALVE MV Area (PHT): 5.25 cm     SHUNTS  MV Decel Time: 145 msec     Systemic VTI:  0.13 m MV E velocity: 108.50 cm/s  Systemic Diam: 1.70 cm Oswaldo Milian MD Electronically signed by Oswaldo Milian MD Signature Date/Time: 07/11/2020/8:21:55 PM    Final      ASSESSMENT & PLAN:  Caitlyn Hendricks is a 40 y.o. 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 Her 03/08/20 biopsy showed she has grade III invasive ductal carcinoma of left breast metastatic to her left axillary LN. Her ER/PR/HER2 markers were all negative.CT CAP/Bone scan negative for distant mets.  -Surgery is the only curative option. She has consulted with Dr. Donne Hazel about surgery and left mastectomy was offered after neoadjuvant chemo. She is interested in b/l mastectomy and reconstruction for symmetry.  -To downstage disease and reduce risk of recurrence, I started her onneoadjuvant chemo with ddAC q2weeksfor 4 cycles beginning9/15/21-10/27/21. This wasfollowed by weekly carbo/taxolfor 12 weeksstarting 11/10/21before proceeding with surgery. -Based onrecently publish [id number] trial data,I added Keytruda q3weeks for 1 year treatment on 04/28/20. -Her left breast mass remains non-palpable since C2 and left axilla barely palpable s/p C3,she is clinically responding to chemotherapy. Plan breast MRI after last dose chemo before proceeding with surgery. If she has significant residual disease on surgical pathology I may recommend adjuvant Xeloda.  -She will proceed with post-mastectomy radiation given her positive LN.  -S/p week 7 weeks of CT she developed an episode of body swelling, mild mucositis, increased muscle  tightness/aches and beginning stages of neuropathy in extremities (Hands>feet). I reviewed management with her. Her 07/11/20 Echo was overall normal.  -Labs reviewed, WBC 2.2, Hg 10, ANC 1.1, BG 109, Alk Phos 129. Overall adequate to proceed with week 8 CT today. Due to her neutropenia and pending GCSF approval, will reduced her carbo dose today  -Continue CT weekly and Keytruda q3weeks. Proceed with next cycle, next week.  -F/u weekly.    2. Symptom management: Full body aches, Constipation, Nausea, Body swelling -Secondary to chemo -She has full body aches secondary to Taxol which has increased. She uses ibuprofen (about 3 times a week) and muscle relaxer. I refilled today baclofen today (07/12/20)  -Nausea well controlled on compazine. She did not tolerate zofran due to headaches.  -She has increased constipation which she uses Miralax and use SITS bath after each BM. -For her mucositis she has magic mouthwash but leads to over numbing. She can use salt water rinses as well.  -She had 2-3 days episode of swelling in Lower and upper extremity and face at end of week 7. Probably related to chemo Will monitor.  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 She has very mild sensory deficit on exam today in hands (right>left) (07/12/20).  -She is on Gabapentin for hot flashes, she can continue along with ice bags during infusion.   4. Genetic Testingnegative for pathogenetic mutations with VUS of POLE gene  5. Anxiety/Depression, Social Support  -She is currently on Xanax 87m TID and Celexa 413m This is managed by her PCP. -Sheis willing to f/u with SW for counseling as needed. -She notes she has been stressed this past year and  was binge drinking occasionally. She quit drinking 02/09/20 and does not plan to restart. She does not plan to restart smoking or vaping.   -She has very good social supportfrom mother and boyfriend. 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  -She is on low dose Ambien for sleep.  6. Hot flashes -I discussed chemo will put her in perimenopause, so symptoms of hot flashes can happen. Last full period with start of chemo (03/2020) and mild period last month.  -Her hot flashes have significantly increased on chemo and nearly constant impacting her sleep and daily active.  -She is on Celexa 58m currently. She is also on Gabapentin 1070m    PLAN: -I refilled baclofen today  -Labs reviewed and adequate to proceed withweek 8 CT today with carbo dose reduction, granix when it's approved  -Keytruda in 1, 4 weeks  -Lab, flush, f/u and carbo/taxolin 1, 2, 3, 4 weeks.    No problem-specific Assessment & Plan notes found for this encounter.   No orders of the defined types were placed in this encounter.  All questions were answered. The patient knows to call the clinic with any problems, questions or concerns. No barriers to learning was detected. The total time spent in the appointment was 30 minutes.     YaTruitt MerleMD 07/12/2020   I, AmJoslyn Devonam acting as scribe for YaTruitt MerleMD.   I have reviewed the above documentation for accuracy and completeness, and I agree with the above.

## 2020-07-10 NOTE — Telephone Encounter (Signed)
Pt made aware of echo scheduled for tomorrow arrival at 745am pt agrees

## 2020-07-11 ENCOUNTER — Other Ambulatory Visit: Payer: Self-pay

## 2020-07-11 ENCOUNTER — Telehealth: Payer: Self-pay

## 2020-07-11 ENCOUNTER — Ambulatory Visit (HOSPITAL_COMMUNITY)
Admission: RE | Admit: 2020-07-11 | Discharge: 2020-07-11 | Disposition: A | Discharge status: Home / Self Care | Payer: 59 | Source: Ambulatory Visit | Attending: Nurse Practitioner | Admitting: Nurse Practitioner

## 2020-07-11 DIAGNOSIS — Z01818 Encounter for other preprocedural examination: Secondary | ICD-10-CM | POA: Insufficient documentation

## 2020-07-11 DIAGNOSIS — Z171 Estrogen receptor negative status [ER-]: Secondary | ICD-10-CM | POA: Insufficient documentation

## 2020-07-11 DIAGNOSIS — I313 Pericardial effusion (noninflammatory): Secondary | ICD-10-CM | POA: Diagnosis not present

## 2020-07-11 DIAGNOSIS — Z0189 Encounter for other specified special examinations: Secondary | ICD-10-CM

## 2020-07-11 DIAGNOSIS — C50112 Malignant neoplasm of central portion of left female breast: Secondary | ICD-10-CM | POA: Diagnosis not present

## 2020-07-11 LAB — ECHOCARDIOGRAM COMPLETE
Area-P 1/2: 5.25 cm2
S' Lateral: 2.7 cm

## 2020-07-11 NOTE — Progress Notes (Signed)
  Echocardiogram 2D Echocardiogram has been performed.  Caitlyn Hendricks 07/11/2020, 9:13 AM

## 2020-07-11 NOTE — Telephone Encounter (Signed)
TC from Pt stating she has some symptoms and wanted to know about appointment tomorrow. Spoke with Pt who stated she has some edema and went for an echocardiogram today. Asked Pt. If she was short of breathe. Pt stated no and stated she just gets winded. Informed Pt. To keep her appointment for tomorrow and if symptoms should get worst to call triage line. Pt. Verbalized understanding, No further problems or concerns noted.

## 2020-07-12 ENCOUNTER — Inpatient Hospital Stay (HOSPITAL_BASED_OUTPATIENT_CLINIC_OR_DEPARTMENT_OTHER): Payer: 59 | Admitting: Hematology

## 2020-07-12 ENCOUNTER — Encounter: Payer: Self-pay | Admitting: Hematology

## 2020-07-12 ENCOUNTER — Inpatient Hospital Stay: Payer: 59

## 2020-07-12 ENCOUNTER — Inpatient Hospital Stay: Payer: 59 | Attending: Hematology

## 2020-07-12 ENCOUNTER — Encounter: Payer: Self-pay | Admitting: *Deleted

## 2020-07-12 ENCOUNTER — Other Ambulatory Visit: Payer: Self-pay

## 2020-07-12 VITALS — HR 96

## 2020-07-12 VITALS — BP 115/82 | HR 101 | Temp 98.4°F | Resp 18 | Ht 64.0 in | Wt 205.4 lb

## 2020-07-12 DIAGNOSIS — Z171 Estrogen receptor negative status [ER-]: Secondary | ICD-10-CM

## 2020-07-12 DIAGNOSIS — M7989 Other specified soft tissue disorders: Secondary | ICD-10-CM | POA: Insufficient documentation

## 2020-07-12 DIAGNOSIS — Z5111 Encounter for antineoplastic chemotherapy: Secondary | ICD-10-CM | POA: Diagnosis present

## 2020-07-12 DIAGNOSIS — G629 Polyneuropathy, unspecified: Secondary | ICD-10-CM | POA: Insufficient documentation

## 2020-07-12 DIAGNOSIS — C50112 Malignant neoplasm of central portion of left female breast: Secondary | ICD-10-CM

## 2020-07-12 DIAGNOSIS — R232 Flushing: Secondary | ICD-10-CM | POA: Diagnosis not present

## 2020-07-12 DIAGNOSIS — U071 COVID-19: Secondary | ICD-10-CM | POA: Insufficient documentation

## 2020-07-12 DIAGNOSIS — Z20822 Contact with and (suspected) exposure to covid-19: Secondary | ICD-10-CM | POA: Diagnosis not present

## 2020-07-12 DIAGNOSIS — T451X5A Adverse effect of antineoplastic and immunosuppressive drugs, initial encounter: Secondary | ICD-10-CM | POA: Diagnosis not present

## 2020-07-12 DIAGNOSIS — F418 Other specified anxiety disorders: Secondary | ICD-10-CM | POA: Insufficient documentation

## 2020-07-12 DIAGNOSIS — Z95828 Presence of other vascular implants and grafts: Secondary | ICD-10-CM

## 2020-07-12 DIAGNOSIS — N6325 Unspecified lump in the left breast, overlapping quadrants: Secondary | ICD-10-CM | POA: Diagnosis not present

## 2020-07-12 DIAGNOSIS — R112 Nausea with vomiting, unspecified: Secondary | ICD-10-CM | POA: Diagnosis not present

## 2020-07-12 DIAGNOSIS — K5900 Constipation, unspecified: Secondary | ICD-10-CM | POA: Insufficient documentation

## 2020-07-12 DIAGNOSIS — R110 Nausea: Secondary | ICD-10-CM | POA: Diagnosis not present

## 2020-07-12 DIAGNOSIS — R0609 Other forms of dyspnea: Secondary | ICD-10-CM | POA: Diagnosis not present

## 2020-07-12 DIAGNOSIS — R059 Cough, unspecified: Secondary | ICD-10-CM | POA: Diagnosis not present

## 2020-07-12 DIAGNOSIS — R490 Dysphonia: Secondary | ICD-10-CM | POA: Insufficient documentation

## 2020-07-12 DIAGNOSIS — Z79899 Other long term (current) drug therapy: Secondary | ICD-10-CM | POA: Diagnosis not present

## 2020-07-12 DIAGNOSIS — Z170 Estrogen receptor positive status [ER+]: Secondary | ICD-10-CM | POA: Insufficient documentation

## 2020-07-12 DIAGNOSIS — R000 Tachycardia, unspecified: Secondary | ICD-10-CM | POA: Insufficient documentation

## 2020-07-12 DIAGNOSIS — Z9012 Acquired absence of left breast and nipple: Secondary | ICD-10-CM | POA: Insufficient documentation

## 2020-07-12 LAB — CMP (CANCER CENTER ONLY)
ALT: 53 U/L — ABNORMAL HIGH (ref 0–44)
AST: 39 U/L (ref 15–41)
Albumin: 3.7 g/dL (ref 3.5–5.0)
Alkaline Phosphatase: 129 U/L — ABNORMAL HIGH (ref 38–126)
Anion gap: 9 (ref 5–15)
BUN: 15 mg/dL (ref 6–20)
CO2: 25 mmol/L (ref 22–32)
Calcium: 9.5 mg/dL (ref 8.9–10.3)
Chloride: 105 mmol/L (ref 98–111)
Creatinine: 0.77 mg/dL (ref 0.44–1.00)
GFR, Estimated: >60 mL/min (ref >60)
Glucose, Bld: 109 mg/dL — ABNORMAL HIGH (ref 70–99)
Potassium: 3.9 mmol/L (ref 3.5–5.1)
Sodium: 139 mmol/L (ref 135–145)
Total Bilirubin: 0.5 mg/dL (ref 0.3–1.2)
Total Protein: 7.2 g/dL (ref 6.5–8.1)

## 2020-07-12 LAB — CBC WITH DIFFERENTIAL (CANCER CENTER ONLY)
Abs Immature Granulocytes: 0.01 10*3/uL (ref 0.00–0.07)
Basophils Absolute: 0 10*3/uL (ref 0.0–0.1)
Basophils Relative: 1 %
Eosinophils Absolute: 0 10*3/uL (ref 0.0–0.5)
Eosinophils Relative: 1 %
HCT: 30.8 % — ABNORMAL LOW (ref 36.0–46.0)
Hemoglobin: 10 g/dL — ABNORMAL LOW (ref 12.0–15.0)
Immature Granulocytes: 1 %
Lymphocytes Relative: 27 %
Lymphs Abs: 0.6 10*3/uL — ABNORMAL LOW (ref 0.7–4.0)
MCH: 30.3 pg (ref 26.0–34.0)
MCHC: 32.5 g/dL (ref 30.0–36.0)
MCV: 93.3 fL (ref 80.0–100.0)
Monocytes Absolute: 0.5 10*3/uL (ref 0.1–1.0)
Monocytes Relative: 22 %
Neutro Abs: 1.1 10*3/uL — ABNORMAL LOW (ref 1.7–7.7)
Neutrophils Relative %: 48 %
Platelet Count: 255 10*3/uL (ref 150–400)
RBC: 3.3 MIL/uL — ABNORMAL LOW (ref 3.87–5.11)
RDW: 19.1 % — ABNORMAL HIGH (ref 11.5–15.5)
WBC Count: 2.2 10*3/uL — ABNORMAL LOW (ref 4.0–10.5)
nRBC: 0 % (ref 0.0–0.2)

## 2020-07-12 LAB — TSH: TSH: 1.267 u[IU]/mL (ref 0.308–3.960)

## 2020-07-12 MED ORDER — SODIUM CHLORIDE 0.9 % IV SOLN
149.3000 mg | Freq: Once | INTRAVENOUS | Status: AC
  Administered 2020-07-12: 150 mg via INTRAVENOUS
  Filled 2020-07-12: qty 15

## 2020-07-12 MED ORDER — SODIUM CHLORIDE 0.9 % IV SOLN
Freq: Once | INTRAVENOUS | Status: AC
  Filled 2020-07-12: qty 250

## 2020-07-12 MED ORDER — FUROSEMIDE 20 MG PO TABS: 20.0000 mg | ORAL_TABLET | Freq: Every day | ORAL | 0 refills | Status: DC | PRN

## 2020-07-12 MED ORDER — HEPARIN SOD (PORK) LOCK FLUSH 100 UNIT/ML IV SOLN
500.0000 [IU] | Freq: Once | INTRAVENOUS | Status: AC | PRN
  Administered 2020-07-12: 500 [IU]
  Filled 2020-07-12: qty 5

## 2020-07-12 MED ORDER — SODIUM CHLORIDE 0.9% FLUSH
10.0000 mL | INTRAVENOUS | Status: DC | PRN
  Administered 2020-07-12: 10 mL
  Filled 2020-07-12: qty 10

## 2020-07-12 MED ORDER — DIPHENHYDRAMINE HCL 50 MG/ML IJ SOLN
INTRAMUSCULAR | Status: AC
  Filled 2020-07-12: qty 1

## 2020-07-12 MED ORDER — SODIUM CHLORIDE 0.9 % IV SOLN
80.0000 mg/m2 | Freq: Once | INTRAVENOUS | Status: AC
  Administered 2020-07-12: 156 mg via INTRAVENOUS
  Filled 2020-07-12: qty 26

## 2020-07-12 MED ORDER — BACLOFEN 10 MG PO TABS: 10.0000 mg | ORAL_TABLET | Freq: Two times a day (BID) | ORAL | 0 refills | Status: DC | PRN

## 2020-07-12 MED ORDER — PALONOSETRON HCL INJECTION 0.25 MG/5ML
INTRAVENOUS | Status: AC
  Filled 2020-07-12: qty 5

## 2020-07-12 MED ORDER — PALONOSETRON HCL INJECTION 0.25 MG/5ML
0.2500 mg | Freq: Once | INTRAVENOUS | Status: AC
  Administered 2020-07-12: 0.25 mg via INTRAVENOUS

## 2020-07-12 MED ORDER — FAMOTIDINE IN NACL 20-0.9 MG/50ML-% IV SOLN
INTRAVENOUS | Status: AC
  Filled 2020-07-12: qty 50

## 2020-07-12 MED ORDER — DIPHENHYDRAMINE HCL 50 MG/ML IJ SOLN
50.0000 mg | Freq: Once | INTRAMUSCULAR | Status: AC
  Administered 2020-07-12: 50 mg via INTRAVENOUS

## 2020-07-12 MED ORDER — FAMOTIDINE IN NACL 20-0.9 MG/50ML-% IV SOLN
20.0000 mg | Freq: Once | INTRAVENOUS | Status: AC
  Administered 2020-07-12: 20 mg via INTRAVENOUS

## 2020-07-12 MED ORDER — SODIUM CHLORIDE 0.9% FLUSH
10.0000 mL | Freq: Once | INTRAVENOUS | Status: AC
  Administered 2020-07-12: 10 mL
  Filled 2020-07-12: qty 10

## 2020-07-12 MED ORDER — METHYLPREDNISOLONE SODIUM SUCC 40 MG IJ SOLR
20.0000 mg | Freq: Once | INTRAMUSCULAR | Status: AC
  Administered 2020-07-12: 20 mg via INTRAVENOUS

## 2020-07-12 MED ORDER — METHYLPREDNISOLONE SODIUM SUCC 40 MG IJ SOLR
INTRAMUSCULAR | Status: AC
  Filled 2020-07-12: qty 1

## 2020-07-12 NOTE — Progress Notes (Signed)
Pt HR at rest 90-100. Pt HR with walking around infusion unit for 5 minutes 120-137. O2 sats remain 98-100% at rest and with exertion. Dr. Mosetta Putt notified.

## 2020-07-12 NOTE — Patient Instructions (Signed)
[facility] Discharge Instructions for Patients Receiving Chemotherapy  Today you received the following chemotherapy agents: paclitaxel, carboplatin.  To help prevent nausea and vomiting after your treatment, we encourage you to take your nausea medication as directed.    If you develop nausea and vomiting that is not controlled by your nausea medication, call the clinic.   BELOW ARE SYMPTOMS THAT SHOULD BE REPORTED IMMEDIATELY:  *FEVER GREATER THAN 100.5 F  *CHILLS WITH OR WITHOUT FEVER  NAUSEA AND VOMITING THAT IS NOT CONTROLLED WITH YOUR NAUSEA MEDICATION  *UNUSUAL SHORTNESS OF BREATH  *UNUSUAL BRUISING OR BLEEDING  TENDERNESS IN MOUTH AND THROAT WITH OR WITHOUT PRESENCE OF ULCERS  *URINARY PROBLEMS  *BOWEL PROBLEMS  UNUSUAL RASH Items with * indicate a potential emergency and should be followed up as soon as possible.  Feel free to call the clinic should you have any questions or concerns. The clinic phone number is [phone].  Please show the CHEMO ALERT CARD at check-in to the Emergency Department and triage nurse.   

## 2020-07-13 ENCOUNTER — Other Ambulatory Visit: Payer: Self-pay | Admitting: Nurse Practitioner

## 2020-07-13 ENCOUNTER — Telehealth: Payer: Self-pay | Admitting: Hematology

## 2020-07-13 DIAGNOSIS — J310 Chronic rhinitis: Secondary | ICD-10-CM

## 2020-07-13 DIAGNOSIS — J329 Chronic sinusitis, unspecified: Secondary | ICD-10-CM

## 2020-07-13 LAB — T4: T4, Total: 5.6 ug/dL (ref 4.5–12.0)

## 2020-07-13 NOTE — Telephone Encounter (Signed)
Scheduled appts per 1/5 los. Pt to get updated appt calendar at next visit per appt notes.  °

## 2020-07-17 NOTE — Progress Notes (Signed)
[place]   [phone] [phone]   Clinic Follow up Note   Patient Care Team: [name], MD as PCP - General (Family Medicine) Mauro Kaufmann, RN as Oncology Nurse Navigator Rockwell Germany, RN as Oncology Nurse Navigator [name], MD as Consulting Physician (General Surgery) Truitt Merle, MD as Consulting Physician (Hematology)  Date of Service:  07/19/2020  CHIEF COMPLAINT: F/u of left breast cancer  SUMMARY OF ONCOLOGIC HISTORY: Oncology History Overview Note  Cancer Staging Cancer of central portion of left female breast [facility]) Staging form: Breast, AJCC 8th Edition - Clinical: No stage assigned - Unsigned    Cancer of central portion of left female breast ([place])  02/23/2020 Mammogram   Diagnostic Mammogram 02/23/20  IMPRESSION The 2x1x2.6cm irregular mass in teh left breast at 12:00 posiiton middle depth is highly suspicious of malignancy. An [place] is recommended for further evaluation and biopsy planning purposes.   02/23/2020 Breast US   [name] 02/23/20  IMPRESSION 2 adjacent spiculated masses in the left brast at 12:00 position 3 cm from the nipple (2.1x0.9x1.1cm and 1.1x1.4x0.5cm) is suggestive of malignancy.   Multiple abnormal left subpectoral and left axillary nodes measuring 3.3x2.1 cm concerning for metastatic adenopathy.    Left breast skin thickening and edema may be secondary congestive edema due to extensive axillary adenopathy.    03/08/2020 Initial Biopsy   Diagnosis 1.Breast, left, needle core biopsy, 12:00 position, 3cmfn -INVASIVE DUCTAL CARCINOMA -SEE COMMENT  2. Lymph node, needle/core biopsy, left axilla -METASTATIC CARCINOMA INVOLVING A LYMPH NODE  -LYMPHOVASCULAR SPACE INVASION PRESENT   Microscopic Comment  1.Based on the biopsy the carcinoma appears Nottingham Grade 3 or 3 and measures 1 cm in the greatest linear extent.    03/08/2020 Receptors her2   ER- Negative 0% PR - Negative  0% HER2 - Negative  KI 67 - 80%    03/08/2020 Cancer Staging   Staging form: Breast, AJCC 8th Edition - Clinical stage from 03/08/2020: Stage IIIB (cT2, cN1, cM0, G3, ER-, PR-, HER2-) - Signed by Truitt Merle, MD on 03/10/2020   03/10/2020 Initial Diagnosis   Cancer of central portion of left female breast ([place])   03/16/2020 Breast MRI   IMPRESSION: 1. 8.1 x 7.8 x 6.6 cm biopsy proven invasive ductal carcinoma in the central right breast, involving 3 quadrants. 2. 3.0 x 1.7 x 1.1 cm satellite mass more inferiorly in lower inner quadrant of the left breast, compatible with additional malignancy. 3. Metastatic level 1 and level 2 left axillary lymph nodes. 4. No evidence of malignancy on the right.   03/17/2020 Imaging   IMPRESSION: CT CAP w contrast  1. Diffuse skin thickening in the left breast with left axillary and subpectoral lymphadenopathy, as well as a mildly enlarged left supraclavicular lymph node, which likely represents metastatic lymphadenopathy. No other definite extra nodal metastatic disease noted elsewhere in the chest, abdomen or pelvis. 2. Large mass in the central anatomic pelvis which is of uncertain origin, potentially a large exophytic fibroid or a large solid mass arising from the right ovary. Further evaluation with pelvic ultrasound is strongly recommended.   03/21/2020 Imaging   Bone Scan  IMPRESSION: Apparent arthropathy at L5. No bony metastatic disease is demonstrable on this study. Scattered foci of abnormal uptake in a pelvic mass is of uncertain etiology given absence of calcification in this mass by CT. This mass compresses the urinary bladder. It is possible that some of the increased uptake in this  area actually represents physiologic uptake within the bladder.   Kidneys noted in flank positions bilaterally.     03/22/2020 -  Neo-Adjuvant Chemotherapy   Neoadjuvant Adriamycin and Cytoxan q2weeks for 4 cycles starting 03/22/20-05/03/20 followed by  weekly Taxol and Carboplatin for 12 weeks starting 05/17/20   03/24/2020 Imaging   US Pelvis  IMPRESSION: 1. Large pedunculated lesion directly contiguous with the uterine most suggestive of a large subserosal fibroid which measures up to 8.2 cm. 2. Additional 1 cm probable intramural fibroid in the right anterior uterine body. 3. No other acute or worrisome pelvic abnormality.   04/28/2020 -  Antibody Plan   Added Keytruda q3weeks starting 04/28/20 to complete 1 year of treatment       CURRENT THERAPY:  Neoadjuvant chemo ddACq2weeks for 4 cyclesstarting 03/22/20-05/03/20, followed by weekly carbo/taxolfor 12 weeksstarting 05/17/20. -Added Keytruda q3weeks to complete 1 year of treatmenton10/22/21  INTERVAL HISTORY:  Caitlyn Hendricks is here for a follow up. She presents to the clinic with her mother. She notes after she took Lasix at night she had N&V hours after until she vomited blood in mucus once. She does not want to take this again. She notes not much change in her neuropathy.     REVIEW OF SYSTEMS:   Constitutional: Denies fevers, chills or abnormal weight loss Eyes: Denies blurriness of vision Ears, nose, mouth, throat, and face: Denies mucositis or sore throat Respiratory: Denies cough, dyspnea or wheezes Cardiovascular: Denies palpitation, chest discomfort or lower extremity swelling Gastrointestinal:  Denies nausea, heartburn or change in bowel habits Skin: Denies abnormal skin rashes Lymphatics: Denies new lymphadenopathy or easy bruising Neurological:Denies numbness, tingling or new weaknesses Behavioral/Psych: Mood is stable, no new changes  All other systems were reviewed with the patient and are negative.  MEDICAL HISTORY:  Past Medical History:  Diagnosis Date  . Anxiety   . Depression     SURGICAL HISTORY: Past Surgical History:  Procedure Laterality Date  . PORTACATH PLACEMENT Right 03/21/2020   Procedure: INSERTION PORT-A-CATH WITH  ULTRASOUND GUIDANCE;  Surgeon: [name], MD;  Location: [place];  Service: General;  Laterality: Right;    I have reviewed the social history and family history with the patient and they are unchanged from previous note.  ALLERGIES:  is allergic to medroxyprogesterone.  MEDICATIONS:  Current Outpatient Medications  Medication Sig Dispense Refill  . ALPRAZolam (XANAX) 0.5 MG tablet Take 0.5 mg by mouth 3 (three) times daily.    Marland Kitchen ALPRAZolam (XANAX) 1 MG tablet Take 1 mg by mouth 3 (three) times daily as needed for anxiety.     Marland Kitchen azithromycin (ZITHROMAX) 250 MG tablet Take as prescribed 6 each 0  . baclofen (LIORESAL) 10 MG tablet Take 1 tablet (10 mg total) by mouth 2 (two) times daily as needed for muscle spasms. 30 each 0  . citalopram (CELEXA) 20 MG tablet Take 20 mg by mouth daily.    Marland Kitchen dexamethasone (DECADRON) 4 MG tablet Take 2 tablets by mouth daily starting the day after Carboplatin and Cytoxan x 3 days. Take with food. (Patient not taking: Reported on 06/21/2020) 30 tablet 1  . fluconazole (DIFLUCAN) 150 MG tablet Take 1 tablet for a yeast infection if needed, may repeat in 3 days if needed Use if needed while on antibiotic. 2 tablet 0  . fluticasone (FLONASE) 50 MCG/ACT nasal spray Place 2 sprays into both nostrils daily for 7 days. 15.8 mL 0  . furosemide (LASIX) 20 MG tablet Take 1  tablet (20 mg total) by mouth daily as needed for edema. 10 tablet 0  . gabapentin (NEURONTIN) 100 MG capsule Take 1 capsule (100 mg total) by mouth 3 (three) times daily. May increase to 353m three times daily as need in a few weeks if tolerates well 90 capsule 0  . lidocaine-prilocaine (EMLA) cream Apply to affected area once 30 g 3  . methylPREDNISolone (MEDROL DOSEPAK) 4 MG TBPK tablet Take 1 tablet (4 mg total) by mouth taper from 4 doses each day to 1 dose and stop. 1 each 0  . omeprazole (PRILOSEC) 20 MG capsule Take 1 capsule (20 mg total) by mouth 2 (two) times daily  before a meal. 60 capsule 2  . ondansetron (ZOFRAN) 8 MG tablet Take 1 tablet (8 mg total) by mouth 2 (two) times daily as needed. Start on the third day after chemotherapy. (Patient not taking: Reported on 06/21/2020) 30 tablet 2  . prochlorperazine (COMPAZINE) 10 MG tablet Take 1 tablet (10 mg total) by mouth every 6 (six) hours as needed (Nausea or vomiting). 30 tablet 2  . sertraline (ZOLOFT) 50 MG tablet Take by mouth.    . traMADol (ULTRAM) 50 MG tablet Take 1 tablet (50 mg total) by mouth daily as needed for severe pain (headache). 30 tablet 0  . triamcinolone ointment (KENALOG) 0.5 % Apply 1 application topically 2 (two) times daily. 30 g 2  . zolpidem (AMBIEN) 10 MG tablet Take 1 tablet (10 mg total) by mouth at bedtime as needed for sleep. 30 tablet 1   No current facility-administered medications for this visit.   Facility-Administered Medications Ordered in Other Visits  Medication Dose Route Frequency Provider Last Rate Last Admin  . sodium chloride flush (NS) 0.9 % injection 10 mL  10 mL Intravenous PRN BAlla Feeling NP   10 mL at 03/28/20 1104  . sodium chloride flush (NS) 0.9 % injection 10 mL  10 mL Intracatheter PRN FTruitt Merle MD   10 mL at 07/19/20 1349    PHYSICAL EXAMINATION: ECOG PERFORMANCE STATUS: 1 - Symptomatic but completely ambulatory  Vitals:   07/19/20 0958  BP: 127/88  Pulse: (!) 105  Resp: 15  Temp: 97.9 F (36.6 C)  SpO2: 100%   Filed Weights   07/19/20 0958  Weight: 205 lb 1.6 oz (93 kg)    GENERAL:alert, no distress and comfortable SKIN: skin color, texture, turgor are normal, no rashes or significant lesions EYES: normal, Conjunctiva are pink and non-injected, sclera clear  NECK: supple, thyroid normal size, non-tender, without nodularity LYMPH:  no palpable lymphadenopathy in the cervical, axillary  LUNGS: clear to auscultation and percussion with normal breathing effort HEART: regular rate & rhythm and no murmurs and no lower extremity  edema ABDOMEN:abdomen soft, non-tender and normal bowel sounds Musculoskeletal:no cyanosis of digits and no clubbing  NEURO: alert & oriented x 3 with fluent speech, no focal motor/sensory deficits, vibration sensation were normal on bilateral hands  LABORATORY DATA:  I have reviewed the data as listed CBC Latest Ref Rng & Units 07/19/2020 07/12/2020 07/05/2020  WBC 4.0 - 10.5 K/uL 2.6(L) 2.2(L) 1.6(L)  Hemoglobin 12.0 - 15.0 g/dL 9.8(L) 10.0(L) 8.8(L)  Hematocrit 36.0 - 46.0 % 30.0(L) 30.8(L) 27.2(L)  Platelets 150 - 400 K/uL 255 255 160     CMP Latest Ref Rng & Units 07/19/2020 07/12/2020 07/05/2020  Glucose 70 - 99 mg/dL 92 109(H) 106(H)  BUN 6 - 20 mg/dL _0 Creatinine 0.44 - 1.00 mg/dL  0.81 0.77 0.66  Sodium 135 - 145 mmol/L 139 139 139  Potassium 3.5 - 5.1 mmol/L 4.4 3.9 4.3  Chloride 98 - 111 mmol/L 108 105 108  CO2 22 - 32 mmol/L _0 Calcium 8.9 - 10.3 mg/dL 9.1 9.5 8.8(L)  Total Protein 6.5 - 8.1 g/dL 7.1 7.2 6.2(L)  Total Bilirubin 0.3 - 1.2 mg/dL 0.3 0.5 0.6  Alkaline Phos 38 - 126 U/L 130(H) 129(H) 112  AST 15 - 41 U/L 28 39 20  ALT 0 - 44 U/L 32 53(H) 23      RADIOGRAPHIC STUDIES: I have personally reviewed the radiological images as listed and agreed with the findings in the report. No results found.   ASSESSMENT & PLAN:  Caitlyn Hendricks is a 40 y.o. 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 Her 03/08/20 biopsy showedshe has grade III invasive ductal carcinoma of left breast metastatic to her left axillary LN. Her ER/PR/HER2 markers were all negative.CT CAP/Bone scan negative for distant mets. -Surgery is the only curative option. She has consulted with Dr.  Donne Hazel about surgery and left mastectomy was offered after neoadjuvant chemo. She is interested in b/l mastectomy and reconstruction for symmetry. -To downstage disease and reduce risk of recurrence, I started her onneoadjuvant chemo with ddAC q2weeksfor 4 cycles beginning9/15/21-10/27/21. This wasfollowed by weekly carbo/taxolfor 12 weeksstarting 11/10/21before proceeding with surgery. -Based onrecently publish [id number] trial data,I added Keytruda q3weeks for 1 year treatment on 04/28/20. -Her left breast mass remains non-palpable since C2 and left axilla barely palpable s/p C3,she is clinically responding to chemotherapy.Plan breast MRI after last dose chemo before proceeding with surgery. If she has significant residual disease on surgical pathology I may recommend adjuvant Xeloda.  -She will proceed with post-mastectomy radiation given her positive LN.  -S/p week 8 of CT she continues to have mild swelling (mainly in hands) and mild SOB. Her very mild neuropathy is stable, no significant sensation deficits on exam today.  -Labs reviewed, WBC 2.6, Hg 9.8, ANC 1.6, Alk Phos 130. Overall adequate to proceed with week 9 CT today with increase in [place] dose and Keytruda.  -Continue CT weekly and Keytruda q3weeks. After treatment will proceed with MRI breast before surgery. I discussed if she has significant residual disease on surgical path, I may offer adjuvant chemo with Xeloda.  -F/u weekly.    2. Symptom management: Full body aches, Constipation, Nausea, Body swelling -Secondary to chemo -She has full body aches secondary to Taxol which has increased. She uses ibuprofen (about 3 times a week) and muscle relaxer.I refilled today baclofen today (07/12/20)  -Nausea well controlled on compazine. She did not tolerate zofran due to headaches.  -She has increased constipationwhich she usesMiralaxand use SITS bath after each BM. -For her mucositis she has magic mouthwash but leads  to over numbing. She can use salt water rinses as well.  -She had 2-3 days episode of swelling in Lower and upper extremity and face at end of week 7. Probably related to chemo. She tried Lasix once but had significant N&V hours after. I discussed this is not a usual side effect, but she is fine to stop it.    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 She has very mild sensory deficit on exam in hands (right>left) (07/12/20).  -She is on Gabapentin for hot flashes, she can continue along with ice bags during infusion.  -Mild and stable. Will continue to monitor.   4. Genetic Testingnegative for pathogenetic mutations with VUS of POLE gene  5. Anxiety/Depression, Social Support  -She is currently on Xanax 59m TID and Celexa451m This is managed by her PCP. -Sheis willing to f/u with SW for counseling as needed. -She notes she has been stressed this past year and was binge drinking occasionally. She quit drinking 02/09/20 and does not plan to restart. She does not plan to restart smoking or vaping.  -She has very good social supportfrom mother and boyfriend. 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  -She is on low dose Ambien for sleep.  6. Hot flashes -I discussed chemo will put her in perimenopause, so symptoms of hot flashes can happen. Last full period with start of chemo (03/2020) and mild period last month.  -Her hot flashes have significantly increased on chemo and nearly constant impacting her sleep and daily active.  -She is on Celexa 4028murrently. She is also on Gabapentin 100m1m 7.  Dyspnea on exertion, tachycardia, deconditioning -secondary to chemo  -I encouraged her to exercise, and stay active.  PLAN: -Labs reviewed and adequate to proceed withweek9CT with increase in carb[place] slightly to AUC 1.2 and Keytruda today -Keytruda in  3, 6 weeks.  -Lab, flush, f/u and carbo/taxolin 1, 2, 3 weeks    No problem-specific Assessment & Plan notes found for this encounter.   No orders of the defined types were placed in this encounter.  All questions were answered. The patient knows to call the clinic with any problems, questions or concerns. No barriers to learning was detected. The total time spent in the appointment was 30 minutes.     Haze Antillon Truitt Merle 07/19/2020   I, AmoyJoslyn Devon acting as scribe for Gunda Maqueda Truitt Merle.   I have reviewed the above documentation for accuracy and completeness, and I agree with the above.

## 2020-07-19 ENCOUNTER — Inpatient Hospital Stay: Payer: 59

## 2020-07-19 ENCOUNTER — Inpatient Hospital Stay (HOSPITAL_BASED_OUTPATIENT_CLINIC_OR_DEPARTMENT_OTHER): Payer: 59 | Admitting: Hematology

## 2020-07-19 ENCOUNTER — Encounter: Payer: Self-pay | Admitting: Hematology

## 2020-07-19 ENCOUNTER — Other Ambulatory Visit: Payer: Self-pay

## 2020-07-19 VITALS — HR 99

## 2020-07-19 VITALS — BP 127/88 | HR 105 | Temp 97.9°F | Resp 15 | Ht 64.0 in | Wt 205.1 lb

## 2020-07-19 DIAGNOSIS — C50112 Malignant neoplasm of central portion of left female breast: Secondary | ICD-10-CM

## 2020-07-19 DIAGNOSIS — Z171 Estrogen receptor negative status [ER-]: Secondary | ICD-10-CM

## 2020-07-19 DIAGNOSIS — Z5111 Encounter for antineoplastic chemotherapy: Secondary | ICD-10-CM | POA: Diagnosis not present

## 2020-07-19 DIAGNOSIS — Z95828 Presence of other vascular implants and grafts: Secondary | ICD-10-CM

## 2020-07-19 LAB — CBC WITH DIFFERENTIAL (CANCER CENTER ONLY)
Abs Immature Granulocytes: 0.01 10*3/uL (ref 0.00–0.07)
Basophils Absolute: 0 10*3/uL (ref 0.0–0.1)
Basophils Relative: 1 %
Eosinophils Absolute: 0 10*3/uL (ref 0.0–0.5)
Eosinophils Relative: 0 %
HCT: 30 % — ABNORMAL LOW (ref 36.0–46.0)
Hemoglobin: 9.8 g/dL — ABNORMAL LOW (ref 12.0–15.0)
Immature Granulocytes: 0 %
Lymphocytes Relative: 29 %
Lymphs Abs: 0.8 10*3/uL (ref 0.7–4.0)
MCH: 31.1 pg (ref 26.0–34.0)
MCHC: 32.7 g/dL (ref 30.0–36.0)
MCV: 95.2 fL (ref 80.0–100.0)
Monocytes Absolute: 0.3 10*3/uL (ref 0.1–1.0)
Monocytes Relative: 10 %
Neutro Abs: 1.6 10*3/uL — ABNORMAL LOW (ref 1.7–7.7)
Neutrophils Relative %: 60 %
Platelet Count: 255 10*3/uL (ref 150–400)
RBC: 3.15 MIL/uL — ABNORMAL LOW (ref 3.87–5.11)
RDW: 17.4 % — ABNORMAL HIGH (ref 11.5–15.5)
WBC Count: 2.6 10*3/uL — ABNORMAL LOW (ref 4.0–10.5)
nRBC: 0 % (ref 0.0–0.2)

## 2020-07-19 LAB — CMP (CANCER CENTER ONLY)
ALT: 32 U/L (ref 0–44)
AST: 28 U/L (ref 15–41)
Albumin: 3.7 g/dL (ref 3.5–5.0)
Alkaline Phosphatase: 130 U/L — ABNORMAL HIGH (ref 38–126)
Anion gap: 6 (ref 5–15)
BUN: 10 mg/dL (ref 6–20)
CO2: 25 mmol/L (ref 22–32)
Calcium: 9.1 mg/dL (ref 8.9–10.3)
Chloride: 108 mmol/L (ref 98–111)
Creatinine: 0.81 mg/dL (ref 0.44–1.00)
GFR, Estimated: >60 mL/min (ref >60)
Glucose, Bld: 92 mg/dL (ref 70–99)
Potassium: 4.4 mmol/L (ref 3.5–5.1)
Sodium: 139 mmol/L (ref 135–145)
Total Bilirubin: 0.3 mg/dL (ref 0.3–1.2)
Total Protein: 7.1 g/dL (ref 6.5–8.1)

## 2020-07-19 LAB — TSH: TSH: 0.716 u[IU]/mL (ref 0.308–3.960)

## 2020-07-19 MED ORDER — SODIUM CHLORIDE 0.9 % IV SOLN
200.0000 mg | Freq: Once | INTRAVENOUS | Status: AC
  Administered 2020-07-19: 200 mg via INTRAVENOUS
  Filled 2020-07-19: qty 8

## 2020-07-19 MED ORDER — DIPHENHYDRAMINE HCL 50 MG/ML IJ SOLN
50.0000 mg | Freq: Once | INTRAMUSCULAR | Status: AC
  Administered 2020-07-19: 50 mg via INTRAVENOUS

## 2020-07-19 MED ORDER — SODIUM CHLORIDE 0.9% FLUSH
10.0000 mL | INTRAVENOUS | Status: DC | PRN
  Administered 2020-07-19: 10 mL
  Filled 2020-07-19: qty 10

## 2020-07-19 MED ORDER — FAMOTIDINE IN NACL 20-0.9 MG/50ML-% IV SOLN
20.0000 mg | Freq: Once | INTRAVENOUS | Status: AC
  Administered 2020-07-19: 20 mg via INTRAVENOUS

## 2020-07-19 MED ORDER — SODIUM CHLORIDE 0.9 % IV SOLN
80.0000 mg/m2 | Freq: Once | INTRAVENOUS | Status: AC
  Administered 2020-07-19: 156 mg via INTRAVENOUS
  Filled 2020-07-19: qty 26

## 2020-07-19 MED ORDER — DIPHENHYDRAMINE HCL 50 MG/ML IJ SOLN
INTRAMUSCULAR | Status: AC
  Filled 2020-07-19: qty 1

## 2020-07-19 MED ORDER — HEPARIN SOD (PORK) LOCK FLUSH 100 UNIT/ML IV SOLN
500.0000 [IU] | Freq: Once | INTRAVENOUS | Status: AC | PRN
  Administered 2020-07-19: 500 [IU]
  Filled 2020-07-19: qty 5

## 2020-07-19 MED ORDER — PALONOSETRON HCL INJECTION 0.25 MG/5ML
0.2500 mg | Freq: Once | INTRAVENOUS | Status: AC
  Administered 2020-07-19: 0.25 mg via INTRAVENOUS

## 2020-07-19 MED ORDER — PALONOSETRON HCL INJECTION 0.25 MG/5ML
INTRAVENOUS | Status: AC
  Filled 2020-07-19: qty 5

## 2020-07-19 MED ORDER — SODIUM CHLORIDE 0.9% FLUSH
10.0000 mL | Freq: Once | INTRAVENOUS | Status: AC
  Administered 2020-07-19: 10 mL
  Filled 2020-07-19: qty 10

## 2020-07-19 MED ORDER — METHYLPREDNISOLONE SODIUM SUCC 40 MG IJ SOLR
INTRAMUSCULAR | Status: AC
  Filled 2020-07-19: qty 1

## 2020-07-19 MED ORDER — FAMOTIDINE IN NACL 20-0.9 MG/50ML-% IV SOLN
INTRAVENOUS | Status: AC
  Filled 2020-07-19: qty 50

## 2020-07-19 MED ORDER — SODIUM CHLORIDE 0.9 % IV SOLN
180.0000 mg | Freq: Once | INTRAVENOUS | Status: AC
  Administered 2020-07-19: 180 mg via INTRAVENOUS
  Filled 2020-07-19: qty 18

## 2020-07-19 MED ORDER — METHYLPREDNISOLONE SODIUM SUCC 40 MG IJ SOLR
20.0000 mg | Freq: Once | INTRAMUSCULAR | Status: AC
  Administered 2020-07-19: 20 mg via INTRAVENOUS

## 2020-07-19 MED ORDER — SODIUM CHLORIDE 0.9 % IV SOLN
Freq: Once | INTRAVENOUS | Status: AC
  Filled 2020-07-19: qty 250

## 2020-07-19 NOTE — Patient Instructions (Signed)
[facility] Discharge Instructions for Patients Receiving Chemotherapy  Today you received the following chemotherapy agents: pembrolizumab, paclitaxel, and carboplatin.  To help prevent nausea and vomiting after your treatment, we encourage you to take your nausea medication as directed.   If you develop nausea and vomiting that is not controlled by your nausea medication, call the clinic.   BELOW ARE SYMPTOMS THAT SHOULD BE REPORTED IMMEDIATELY:  *FEVER GREATER THAN 100.5 F  *CHILLS WITH OR WITHOUT FEVER  NAUSEA AND VOMITING THAT IS NOT CONTROLLED WITH YOUR NAUSEA MEDICATION  *UNUSUAL SHORTNESS OF BREATH  *UNUSUAL BRUISING OR BLEEDING  TENDERNESS IN MOUTH AND THROAT WITH OR WITHOUT PRESENCE OF ULCERS  *URINARY PROBLEMS  *BOWEL PROBLEMS  UNUSUAL RASH Items with * indicate a potential emergency and should be followed up as soon as possible.  Feel free to call the clinic should you have any questions or concerns. The clinic phone number is [phone].  Please show the CHEMO ALERT CARD at check-in to the Emergency Department and triage nurse.   

## 2020-07-19 NOTE — Progress Notes (Signed)
Talked w/ pt regarding copay assistance for 2022 and the [employer].  Pt would like to apply so I completed the [employer] enrollmentapplication for Keytruda, got the pt's and Dr. Ernestina Penna signature and faxedtoday for processing.  I will notify the pt of the outcome once received.  Pt is no longer working so she wanted to apply for the [employer].  She was approved for the $1000 [employer].  She has my card for any questions or concerns he may have in the future.

## 2020-07-20 LAB — T4: T4, Total: 5.2 ug/dL (ref 4.5–12.0)

## 2020-07-21 ENCOUNTER — Other Ambulatory Visit: Payer: Self-pay | Admitting: Hematology

## 2020-07-25 ENCOUNTER — Other Ambulatory Visit: Payer: Self-pay

## 2020-07-25 ENCOUNTER — Encounter: Payer: Self-pay | Admitting: Plastic Surgery

## 2020-07-25 ENCOUNTER — Ambulatory Visit (INDEPENDENT_AMBULATORY_CARE_PROVIDER_SITE_OTHER): Payer: 59 | Admitting: Plastic Surgery

## 2020-07-25 VITALS — BP 128/81 | HR 119 | Ht 64.0 in | Wt 208.2 lb

## 2020-07-25 DIAGNOSIS — Z171 Estrogen receptor negative status [ER-]: Secondary | ICD-10-CM

## 2020-07-25 DIAGNOSIS — C50112 Malignant neoplasm of central portion of left female breast: Secondary | ICD-10-CM

## 2020-07-25 NOTE — Progress Notes (Addendum)
   Patient ID: Caitlyn Hendricks, female    DOB: 10/28/1980, 39 y.o.   MRN: [id number]   Chief Complaint  Patient presents with  . Advice Only  . Breast Cancer  . Breast Problem    The patient is a 39 yrs old female here for a consultation for breast reconstruction.  Approximately 5 months ago she noticed pain and enlargement of her left breast.  She then noticed a mass. 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  It appears to be Nottingham Grade 3 with ER/PR negative and HER2 negative with Ki67 80%.  Her port-a-cath is in place.  She is 5 feet 4 inches tall and weighs 208 pounds.  Her preop bra size is a D cup.  She gained significant weight since starting her chemotherapy.  She has 3 weeks left of chemotherapy and then she will have an MRI and see Dr. Wakefield again.  She is wanting bilateral mastectomies.  She is a dog groomer.   Review of Systems  Constitutional: Negative.  Negative for activity change and appetite change.  HENT: Negative.   Eyes: Negative.   Respiratory: Negative.   Cardiovascular: Negative.   Gastrointestinal: Negative.   Endocrine: Negative.   Genitourinary: Negative.   Musculoskeletal: Negative.   Psychiatric/Behavioral: Negative.     Past Medical History:  Diagnosis Date  . Anxiety   . Depression     Past Surgical History:  Procedure Laterality Date  . PORTACATH PLACEMENT Right 03/21/2020   Procedure: INSERTION PORT-A-CATH WITH ULTRASOUND GUIDANCE;  Surgeon: Wakefield, Matthew, MD;  Location: [facility];  Service: General;  Laterality: Right;      Current Outpatient Medications:  .  ALPRAZolam (XANAX) 0.5 MG tablet, Take 0.5 mg by mouth 3 (three) times daily., Disp: , Rfl:  .  ALPRAZolam (XANAX) 1 MG tablet, Take 1 mg by mouth 3 (three) times daily as needed for anxiety. , Disp: , Rfl:  .  azithromycin (ZITHROMAX) 250 MG tablet, Take as  prescribed, Disp: 6 each, Rfl: 0 .  baclofen (LIORESAL) 10 MG tablet, Take 1 tablet (10 mg total) by mouth 2 (two) times daily as needed for muscle spasms., Disp: 30 each, Rfl: 0 .  citalopram (CELEXA) 20 MG tablet, Take 20 mg by mouth daily., Disp: , Rfl:  .  dexamethasone (DECADRON) 4 MG tablet, Take 2 tablets by mouth daily starting the day after Carboplatin and Cytoxan x 3 days. Take with food. (Patient not taking: Reported on 06/21/2020), Disp: 30 tablet, Rfl: 1 .  fluconazole (DIFLUCAN) 150 MG tablet, Take 1 tablet for a yeast infection if needed, may repeat in 3 days if needed Use if needed while on antibiotic., Disp: 2 tablet, Rfl: 0 .  fluticasone (FLONASE) 50 MCG/ACT nasal spray, Place 2 sprays into both nostrils daily for 7 days., Disp: 15.8 mL, Rfl: 0 .  furosemide (LASIX) 20 MG tablet, Take 1 tablet (20 mg total) by mouth daily as needed for edema., Disp: 10 tablet, Rfl: 0 .  gabapentin (NEURONTIN) 100 MG capsule, Take 1 capsule (100 mg total) by mouth 3 (three) times daily. May increase to 300mg three times daily as need in a few weeks if tolerates well, Disp: 90 capsule, Rfl: 0 .  lidocaine-prilocaine (EMLA) cream, Apply to affected area once, Disp: 30 g, Rfl: 3 .  methylPREDNISolone (MEDROL DOSEPAK) 4 MG TBPK tablet, Take 1 tablet (  4 mg total) by mouth taper from 4 doses each day to 1 dose and stop., Disp: 1 each, Rfl: 0 .  omeprazole (PRILOSEC) 20 MG capsule, Take 1 capsule (20 mg total) by mouth 2 (two) times daily before a meal., Disp: 60 capsule, Rfl: 2 .  ondansetron (ZOFRAN) 8 MG tablet, Take 1 tablet (8 mg total) by mouth 2 (two) times daily as needed. Start on the third day after chemotherapy. (Patient not taking: Reported on 06/21/2020), Disp: 30 tablet, Rfl: 2 .  prochlorperazine (COMPAZINE) 10 MG tablet, Take 1 tablet (10 mg total) by mouth every 6 (six) hours as needed (Nausea or vomiting)., Disp: 30 tablet, Rfl: 2 .  sertraline (ZOLOFT) 50 MG tablet, Take by mouth., Disp: ,  Rfl:  .  traMADol (ULTRAM) 50 MG tablet, Take 1 tablet (50 mg total) by mouth daily as needed for severe pain (headache)., Disp: 30 tablet, Rfl: 0 .  triamcinolone ointment (KENALOG) 0.5 %, Apply 1 application topically 2 (two) times daily., Disp: 30 g, Rfl: 2 .  zolpidem (AMBIEN) 10 MG tablet, Take 1 tablet (10 mg total) by mouth at bedtime as needed for sleep., Disp: 30 tablet, Rfl: 1 No current facility-administered medications for this visit.  Facility-Administered Medications Ordered in Other Visits:  .  sodium chloride flush (NS) 0.9 % injection 10 mL, 10 mL, Intravenous, PRN, Burton, Lacie K, NP, 10 mL at 03/28/20 1104   Objective:   There were no vitals filed for this visit.  Physical Exam Vitals and nursing note reviewed.  Constitutional:      Appearance: Normal appearance.  HENT:     Head: Normocephalic and atraumatic.  Cardiovascular:     Rate and Rhythm: Normal rate.     Pulses: Normal pulses.  Pulmonary:     Effort: Pulmonary effort is normal. No respiratory distress.  Abdominal:     General: Abdomen is flat. There is no distension.     Tenderness: There is no abdominal tenderness.  Neurological:     General: No focal deficit present.     Mental Status: She is alert and oriented to person, place, and time.  Psychiatric:        Mood and Affect: Mood normal.        Behavior: Behavior normal.        Thought Content: Thought content normal.     Assessment & Plan:  Malignant neoplasm of central portion of left breast in female, estrogen receptor negative (HCC).  We had a detailed conversation about the patient's options for breast reconstruction. Several reconstruction options were explained to the patient.  It is important to remember that breast reconstruction is an optional procedure. Reconstruction often requires several stages of surgery and this means more than one operation.  The surgeries are often done several months apart.  The entire process from start to  finish can take a year or more. The major goal of breast reconstruction is to look normal in clothing. There will always be scars and a difference noticeable without clothes.  This is true for asymmetries where both breasts will not be identical.  Surgery may be needed or desired to the non-cancerous breast in order to achieve better symmetry and satisfactory results.  Regardless of the reconstructive method, there is always risks and the possibility that the procedure will fail or have complications.  This could required additional surgeries.    We discussed the available methods of breast reconstruction and included:  1. Tissue expander with Acellular dermal   matrix followed by implant based reconstruction. This can be done as one surgery or multiple surgeries.  2. Autologous reconstruction can include using a muscle or tissue from another area of the body for the reconstruction.  3. Combined procedures like the latissismus dorsi flaps that often uses the muscle with an expander or implant.  For each of the method discussed the risks, benefits, scars and recovery time were discussed in detail. Specific risks included bleeding, infection, hematoma, seroma, scarring, pain, wound healing complications, flap loss, fat necrosis, capsular contracture, need for implant removal, donor site complications, bulge, hernia, umbilical necrosis, need for urgent reoperation, and need for dressing changes.   After the options were discussed we focused on the patient's desires and the procedure that was best for her based on all the information.  A total of 45 minutes of face-to-face time was spent in this encounter, of which >60% was spent in counseling.    After the full discussion of her options she wants to try implant based reconstruction with expanders first followed by implants. She understands that we would try to expander her as quickly as possible to get the implants in prior to any radiation.  If we are not  able to exchange prior to radiation then she will likely need a latissimus muscle flap on the radiated side.    However, I had a discussion with Dr. Wakefield who is very concerned about the patient and the severity and agressiveness of her disease.  He thinks it would be best for her to have a delayed procedure.  Radiation is planned and will include the axilla.  She will need an autologous reconstruction on the left breast.  If the patient still wants to have a mastectomy of the right it may be better to do after the left has received full treatment.     1/20 - I  Had a discussion with the patient on the phone and she understands what Dr. Wakefield is concerned about and states she knows it is the right decision.   We will hold off on surgery with us for now.  Patient knows to see us 6 months after her last radiation treatment.   Pictures were obtained of the patient and placed in the chart with the patient's or guardian's permission.    S , DO 

## 2020-07-26 ENCOUNTER — Inpatient Hospital Stay: Payer: 59

## 2020-07-26 ENCOUNTER — Inpatient Hospital Stay (HOSPITAL_BASED_OUTPATIENT_CLINIC_OR_DEPARTMENT_OTHER): Payer: 59 | Admitting: Hematology

## 2020-07-26 ENCOUNTER — Other Ambulatory Visit: Payer: Self-pay

## 2020-07-26 ENCOUNTER — Encounter: Payer: Self-pay | Admitting: Hematology

## 2020-07-26 ENCOUNTER — Telehealth: Payer: Self-pay

## 2020-07-26 VITALS — BP 122/86 | HR 98 | Temp 97.8°F | Resp 16

## 2020-07-26 DIAGNOSIS — Z171 Estrogen receptor negative status [ER-]: Secondary | ICD-10-CM

## 2020-07-26 DIAGNOSIS — Z95828 Presence of other vascular implants and grafts: Secondary | ICD-10-CM

## 2020-07-26 DIAGNOSIS — C50112 Malignant neoplasm of central portion of left female breast: Secondary | ICD-10-CM

## 2020-07-26 DIAGNOSIS — Z5111 Encounter for antineoplastic chemotherapy: Secondary | ICD-10-CM | POA: Diagnosis not present

## 2020-07-26 LAB — CMP (CANCER CENTER ONLY)
ALT: 42 U/L (ref 0–44)
AST: 38 U/L (ref 15–41)
Albumin: 3.6 g/dL (ref 3.5–5.0)
Alkaline Phosphatase: 112 U/L (ref 38–126)
Anion gap: 8 (ref 5–15)
BUN: 16 mg/dL (ref 6–20)
CO2: 25 mmol/L (ref 22–32)
Calcium: 9.5 mg/dL (ref 8.9–10.3)
Chloride: 107 mmol/L (ref 98–111)
Creatinine: 0.75 mg/dL (ref 0.44–1.00)
GFR, Estimated: >60 mL/min (ref >60)
Glucose, Bld: 96 mg/dL (ref 70–99)
Potassium: 4.1 mmol/L (ref 3.5–5.1)
Sodium: 140 mmol/L (ref 135–145)
Total Bilirubin: 0.3 mg/dL (ref 0.3–1.2)
Total Protein: 7 g/dL (ref 6.5–8.1)

## 2020-07-26 LAB — CBC WITH DIFFERENTIAL (CANCER CENTER ONLY)
Abs Immature Granulocytes: 0.01 10*3/uL (ref 0.00–0.07)
Basophils Absolute: 0 10*3/uL (ref 0.0–0.1)
Basophils Relative: 0 %
Eosinophils Absolute: 0 10*3/uL (ref 0.0–0.5)
Eosinophils Relative: 0 %
HCT: 30.3 % — ABNORMAL LOW (ref 36.0–46.0)
Hemoglobin: 9.5 g/dL — ABNORMAL LOW (ref 12.0–15.0)
Immature Granulocytes: 0 %
Lymphocytes Relative: 30 %
Lymphs Abs: 0.7 10*3/uL (ref 0.7–4.0)
MCH: 29.9 pg (ref 26.0–34.0)
MCHC: 31.4 g/dL (ref 30.0–36.0)
MCV: 95.3 fL (ref 80.0–100.0)
Monocytes Absolute: 0.2 10*3/uL (ref 0.1–1.0)
Monocytes Relative: 10 %
Neutro Abs: 1.4 10*3/uL — ABNORMAL LOW (ref 1.7–7.7)
Neutrophils Relative %: 60 %
Platelet Count: 245 10*3/uL (ref 150–400)
RBC: 3.18 MIL/uL — ABNORMAL LOW (ref 3.87–5.11)
RDW: 16.8 % — ABNORMAL HIGH (ref 11.5–15.5)
WBC Count: 2.4 10*3/uL — ABNORMAL LOW (ref 4.0–10.5)
nRBC: 0 % (ref 0.0–0.2)

## 2020-07-26 LAB — TSH: TSH: 1.916 u[IU]/mL (ref 0.308–3.960)

## 2020-07-26 MED ORDER — SODIUM CHLORIDE 0.9% FLUSH
10.0000 mL | INTRAVENOUS | Status: DC | PRN
  Administered 2020-07-26: 10 mL
  Filled 2020-07-26: qty 10

## 2020-07-26 MED ORDER — HEPARIN SOD (PORK) LOCK FLUSH 100 UNIT/ML IV SOLN
500.0000 [IU] | Freq: Once | INTRAVENOUS | Status: AC | PRN
  Administered 2020-07-26: 500 [IU]
  Filled 2020-07-26: qty 5

## 2020-07-26 MED ORDER — METHYLPREDNISOLONE SODIUM SUCC 40 MG IJ SOLR
INTRAMUSCULAR | Status: AC
  Filled 2020-07-26: qty 1

## 2020-07-26 MED ORDER — DIPHENHYDRAMINE HCL 50 MG/ML IJ SOLN
50.0000 mg | Freq: Once | INTRAMUSCULAR | Status: AC
  Administered 2020-07-26: 50 mg via INTRAVENOUS

## 2020-07-26 MED ORDER — PALONOSETRON HCL INJECTION 0.25 MG/5ML
0.2500 mg | Freq: Once | INTRAVENOUS | Status: AC
  Administered 2020-07-26: 0.25 mg via INTRAVENOUS

## 2020-07-26 MED ORDER — METHYLPREDNISOLONE SODIUM SUCC 40 MG IJ SOLR
20.0000 mg | Freq: Once | INTRAMUSCULAR | Status: AC
  Administered 2020-07-26: 20 mg via INTRAVENOUS

## 2020-07-26 MED ORDER — SODIUM CHLORIDE 0.9% FLUSH
10.0000 mL | Freq: Once | INTRAVENOUS | Status: AC
  Administered 2020-07-26: 10 mL
  Filled 2020-07-26: qty 10

## 2020-07-26 MED ORDER — FAMOTIDINE IN NACL 20-0.9 MG/50ML-% IV SOLN
INTRAVENOUS | Status: AC
  Filled 2020-07-26: qty 50

## 2020-07-26 MED ORDER — FAMOTIDINE IN NACL 20-0.9 MG/50ML-% IV SOLN
20.0000 mg | Freq: Once | INTRAVENOUS | Status: AC
  Administered 2020-07-26: 20 mg via INTRAVENOUS

## 2020-07-26 MED ORDER — DIPHENHYDRAMINE HCL 50 MG/ML IJ SOLN
INTRAMUSCULAR | Status: AC
  Filled 2020-07-26: qty 1

## 2020-07-26 MED ORDER — BACLOFEN 10 MG PO TABS: 10.0000 mg | ORAL_TABLET | Freq: Two times a day (BID) | ORAL | 0 refills | Status: DC | PRN

## 2020-07-26 MED ORDER — PALONOSETRON HCL INJECTION 0.25 MG/5ML
INTRAVENOUS | Status: AC
  Filled 2020-07-26: qty 5

## 2020-07-26 MED ORDER — SODIUM CHLORIDE 0.9 % IV SOLN
Freq: Once | INTRAVENOUS | Status: AC
  Filled 2020-07-26: qty 250

## 2020-07-26 MED ORDER — SODIUM CHLORIDE 0.9 % IV SOLN
80.0000 mg/m2 | Freq: Once | INTRAVENOUS | Status: AC
  Administered 2020-07-26: 156 mg via INTRAVENOUS
  Filled 2020-07-26: qty 26

## 2020-07-26 MED ORDER — SODIUM CHLORIDE 0.9 % IV SOLN
179.1600 mg | Freq: Once | INTRAVENOUS | Status: AC
  Administered 2020-07-26: 180 mg via INTRAVENOUS
  Filled 2020-07-26: qty 18

## 2020-07-26 MED ORDER — SODIUM CHLORIDE 0.9 % IV SOLN: 223.9500 mg | Freq: Once | INTRAVENOUS | Status: DC

## 2020-07-26 NOTE — Progress Notes (Signed)
Per Dr. [name], ok to reduce Carbo today to AUC = 1.2 given ANC = 1.4. Kennith Center, Pharm.D., CPP 07/26/2020@10 :34 AM

## 2020-07-26 NOTE — Progress Notes (Signed)
[place]   [phone] [phone]   Clinic Follow up Note   Patient Care Team: [name], MD as PCP - General (Family Medicine) Mauro Kaufmann, RN as Oncology Nurse Navigator Rockwell Germany, RN as Oncology Nurse Navigator [name], MD as Consulting Physician (General Surgery) Truitt Merle, MD as Consulting Physician (Hematology)  Date of Service:  07/26/2020  CHIEF COMPLAINT: F/u of left breast cancer  SUMMARY OF ONCOLOGIC HISTORY: Oncology History Overview Note  Cancer Staging Cancer of central portion of left female breast [name]) Staging form: Breast, AJCC 8th Edition - Clinical: No stage assigned - Unsigned    Cancer of central portion of left female breast ([place])  02/23/2020 Mammogram   Diagnostic Mammogram 02/23/20  IMPRESSION The 2x1x2.6cm irregular mass in teh left breast at 12:00 posiiton middle depth is highly suspicious of malignancy. An [place] is recommended for further evaluation and biopsy planning purposes.   02/23/2020 Breast US   [name] 02/23/20  IMPRESSION 2 adjacent spiculated masses in the left brast at 12:00 position 3 cm from the nipple (2.1x0.9x1.1cm and 1.1x1.4x0.5cm) is suggestive of malignancy.   Multiple abnormal left subpectoral and left axillary nodes measuring 3.3x2.1 cm concerning for metastatic adenopathy.    Left breast skin thickening and edema may be secondary congestive edema due to extensive axillary adenopathy.    03/08/2020 Initial Biopsy   Diagnosis 1.Breast, left, needle core biopsy, 12:00 position, 3cmfn -INVASIVE DUCTAL CARCINOMA -SEE COMMENT  2. Lymph node, needle/core biopsy, left axilla -METASTATIC CARCINOMA INVOLVING A LYMPH NODE  -LYMPHOVASCULAR SPACE INVASION PRESENT   Microscopic Comment  1.Based on the biopsy the carcinoma appears Nottingham Grade 3 or 3 and measures 1 cm in the greatest linear extent.    03/08/2020 Receptors her2   ER- Negative 0% PR - Negative  0% HER2 - Negative  KI 67 - 80%    03/08/2020 Cancer Staging   Staging form: Breast, AJCC 8th Edition - Clinical stage from 03/08/2020: Stage IIIB (cT2, cN1, cM0, G3, ER-, PR-, HER2-) - Signed by Truitt Merle, MD on 03/10/2020   03/10/2020 Initial Diagnosis   Cancer of central portion of left female breast ([place])   03/16/2020 Breast MRI   IMPRESSION: 1. 8.1 x 7.8 x 6.6 cm biopsy proven invasive ductal carcinoma in the central right breast, involving 3 quadrants. 2. 3.0 x 1.7 x 1.1 cm satellite mass more inferiorly in lower inner quadrant of the left breast, compatible with additional malignancy. 3. Metastatic level 1 and level 2 left axillary lymph nodes. 4. No evidence of malignancy on the right.   03/17/2020 Imaging   IMPRESSION: CT CAP w contrast  1. Diffuse skin thickening in the left breast with left axillary and subpectoral lymphadenopathy, as well as a mildly enlarged left supraclavicular lymph node, which likely represents metastatic lymphadenopathy. No other definite extra nodal metastatic disease noted elsewhere in the chest, abdomen or pelvis. 2. Large mass in the central anatomic pelvis which is of uncertain origin, potentially a large exophytic fibroid or a large solid mass arising from the right ovary. Further evaluation with pelvic ultrasound is strongly recommended.   03/21/2020 Imaging   Bone Scan  IMPRESSION: Apparent arthropathy at L5. No bony metastatic disease is demonstrable on this study. Scattered foci of abnormal uptake in a pelvic mass is of uncertain etiology given absence of calcification in this mass by CT. This mass compresses the urinary bladder. It is possible that some of the increased uptake in this  area actually represents physiologic uptake within the bladder.   Kidneys noted in flank positions bilaterally.     03/22/2020 -  Neo-Adjuvant Chemotherapy   Neoadjuvant Adriamycin and Cytoxan q2weeks for 4 cycles starting 03/22/20-05/03/20 followed by  weekly Taxol and Carboplatin for 12 weeks starting 05/17/20   03/24/2020 Imaging   US Pelvis  IMPRESSION: 1. Large pedunculated lesion directly contiguous with the uterine most suggestive of a large subserosal fibroid which measures up to 8.2 cm. 2. Additional 1 cm probable intramural fibroid in the right anterior uterine body. 3. No other acute or worrisome pelvic abnormality.   04/28/2020 -  Antibody Plan   Added Keytruda q3weeks starting 04/28/20 to complete 1 year of treatment       CURRENT THERAPY:  Neoadjuvant chemo ddACq2weeks for 4 cyclesstarting 03/22/20-05/03/20, followed by weekly carbo/taxolfor 12 weeksstarting 05/17/20. -Added Keytruda q3weeks to complete 1 year of treatmenton10/22/21  INTERVAL HISTORY:  [name] is here for a follow up. She presents to the clinic alone. She notes with dose chemo increase she has tolerated the same. She notes increased bone pain which resolved yesterday. She notes mild nausea and no vomiting. She also denies neuropathy. She notes refill for baclofen. She is overall ready for week 10 CT. She notes she consulted with plastic surgeon yesterday,.    REVIEW OF SYSTEMS:   Constitutional: Denies fevers, chills or abnormal weight loss Eyes: Denies blurriness of vision Ears, nose, mouth, throat, and face: Denies mucositis or sore throat Respiratory: Denies cough, dyspnea or wheezes Cardiovascular: Denies palpitation, chest discomfort or lower extremity swelling Gastrointestinal:  Denies nausea, heartburn or change in bowel habits Skin: Denies abnormal skin rashes Lymphatics: Denies new lymphadenopathy or easy bruising Neurological:Denies numbness, tingling or new weaknesses Behavioral/Psych: Mood is stable, no new changes  All other systems were reviewed with the patient and are negative.  MEDICAL HISTORY:  Past Medical History:  Diagnosis Date  . Anxiety   . Depression     SURGICAL HISTORY: Past Surgical History:   Procedure Laterality Date  . PORTACATH PLACEMENT Right 03/21/2020   Procedure: INSERTION PORT-A-CATH WITH ULTRASOUND GUIDANCE;  Surgeon: [name], MD;  Location: [facility];  Service: General;  Laterality: Right;    I have reviewed the social history and family history with the patient and they are unchanged from previous note.  ALLERGIES:  is allergic to medroxyprogesterone.  MEDICATIONS:  Current Outpatient Medications  Medication Sig Dispense Refill  . ALPRAZolam (XANAX) 0.5 MG tablet Take 0.5 mg by mouth 3 (three) times daily.    Marland Kitchen ALPRAZolam (XANAX) 1 MG tablet Take 1 mg by mouth 3 (three) times daily as needed for anxiety.     Marland Kitchen azithromycin (ZITHROMAX) 250 MG tablet Take as prescribed 6 each 0  . baclofen (LIORESAL) 10 MG tablet Take 1 tablet (10 mg total) by mouth 2 (two) times daily as needed for muscle spasms. 30 each 0  . citalopram (CELEXA) 20 MG tablet Take 20 mg by mouth daily.    Marland Kitchen dexamethasone (DECADRON) 4 MG tablet Take 2 tablets by mouth daily starting the day after Carboplatin and Cytoxan x 3 days. Take with food. 30 tablet 1  . fluconazole (DIFLUCAN) 150 MG tablet Take 1 tablet for a yeast infection if needed, may repeat in 3 days if needed Use if needed while on antibiotic. 2 tablet 0  . fluticasone (FLONASE) 50 MCG/ACT nasal spray Place 2 sprays into both nostrils daily for 7 days. 15.8 mL 0  . furosemide (  LASIX) 20 MG tablet Take 1 tablet (20 mg total) by mouth daily as needed for edema. 10 tablet 0  . gabapentin (NEURONTIN) 100 MG capsule Take 1 capsule (100 mg total) by mouth 3 (three) times daily. May increase to 354m three times daily as need in a few weeks if tolerates well 90 capsule 0  . lidocaine-prilocaine (EMLA) cream Apply to affected area once 30 g 3  . methylPREDNISolone (MEDROL DOSEPAK) 4 MG TBPK tablet Take 1 tablet (4 mg total) by mouth taper from 4 doses each day to 1 dose and stop. 1 each 0  . omeprazole (PRILOSEC) 20 MG  capsule Take 1 capsule (20 mg total) by mouth 2 (two) times daily before a meal. 60 capsule 2  . ondansetron (ZOFRAN) 8 MG tablet Take 1 tablet (8 mg total) by mouth 2 (two) times daily as needed. Start on the third day after chemotherapy. 30 tablet 2  . prochlorperazine (COMPAZINE) 10 MG tablet Take 1 tablet (10 mg total) by mouth every 6 (six) hours as needed (Nausea or vomiting). 30 tablet 2  . sertraline (ZOLOFT) 50 MG tablet Take by mouth.    . traMADol (ULTRAM) 50 MG tablet Take 1 tablet (50 mg total) by mouth daily as needed for severe pain (headache). 30 tablet 0  . triamcinolone ointment (KENALOG) 0.5 % Apply 1 application topically 2 (two) times daily. 30 g 2  . zolpidem (AMBIEN) 10 MG tablet Take 1 tablet (10 mg total) by mouth at bedtime as needed for sleep. 30 tablet 1   No current facility-administered medications for this visit.   Facility-Administered Medications Ordered in Other Visits  Medication Dose Route Frequency Provider Last Rate Last Admin  . sodium chloride flush (NS) 0.9 % injection 10 mL  10 mL Intravenous PRN BAlla Feeling NP   10 mL at 03/28/20 1104  . sodium chloride flush (NS) 0.9 % injection 10 mL  10 mL Intracatheter PRN FTruitt Merle MD   10 mL at 07/26/20 1308    PHYSICAL EXAMINATION: ECOG PERFORMANCE STATUS: 2 - Symptomatic, <50% confined to bed  There were no vitals filed for this visit. There were no vitals filed for this visit.  Due to [place] will limit examination to appearance. Patient had no complaints.  GENERAL:alert, no distress and comfortable SKIN: skin color normal, no rashes or significant lesions EYES: normal, Conjunctiva are pink and non-injected, sclera clear  NEURO: alert & oriented x 3 with fluent speech   LABORATORY DATA:  I have reviewed the data as listed CBC Latest Ref Rng & Units 07/26/2020 07/19/2020 07/12/2020  WBC 4.0 - 10.5 K/uL 2.4(L) 2.6(L) 2.2(L)  Hemoglobin 12.0 - 15.0 g/dL 9.5(L) 9.8(L) 10.0(L)  Hematocrit 36.0 -  46.0 % 30.3(L) 30.0(L) 30.8(L)  Platelets 150 - 400 K/uL 245 255 255     CMP Latest Ref Rng & Units 07/26/2020 07/19/2020 07/12/2020  Glucose 70 - 99 mg/dL 96 92 109(H)  BUN 6 - 20 mg/dL 16 10 15   Creatinine 0.44 - 1.00 mg/dL 0.75 0.81 0.77  Sodium 135 - 145 mmol/L 140 139 139  Potassium 3.5 - 5.1 mmol/L 4.1 4.4 3.9  Chloride 98 - 111 mmol/L 107 108 105  CO2 22 - 32 mmol/L 25 25 25   Calcium 8.9 - 10.3 mg/dL 9.5 9.1 9.5  Total Protein 6.5 - 8.1 g/dL 7.0 7.1 7.2  Total Bilirubin 0.3 - 1.2 mg/dL 0.3 0.3 0.5  Alkaline Phos 38 - 126 U/L 112 130(H) 129(H)  AST  15 - 41 U/L 38 28 39  ALT 0 - 44 U/L 42 32 53(H)      RADIOGRAPHIC STUDIES: I have personally reviewed the radiological images as listed and agreed with the findings in the report. No results found.   ASSESSMENT & PLAN:  Caitlyn Hendricks is a 40 y.o. 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 Her 03/08/20 biopsy showedshe has grade III invasive ductal carcinoma of left breast metastatic to her left axillary LN. Her ER/PR/HER2 markers were all negative.CT CAP/Bone scan negative for distant mets. -Surgery is the only curative option. She has consulted with Dr. Donne Hazel about surgery and left mastectomy was offered after neoadjuvant chemo. She is interested in b/l mastectomy and reconstruction for symmetry. -To downstage disease and reduce risk of recurrence, I started her onneoadjuvant chemo with ddAC q2weeksfor 4 cycles beginning9/15/21-10/27/21. This wasfollowed by weekly carbo/taxolfor 12 weeksstarting 11/10/21before proceeding with surgery. -Based onrecently publish [id number] trial data,I added Keytruda q3weeks for 1 year  treatment on 04/28/20. -Her left breast mass remains non-palpable since C2 and left axilla barely palpable s/p C3,she is clinically responding to chemotherapy.Plan breast MRI after last dose chemo before proceeding with surgery.If she has significant residual disease on surgical pathology I may recommend adjuvant Xeloda.  -She will proceed with post-mastectomy radiation given her positive LN.  -S/p week 9 she tolerated Carboplatin dose increase with stable side effects, no neuropathy. Labs reviewed and adequate to proceed with week 10 CT at same dose.  -Continue chemo weekly and Keytrudaq3weeks. After treatment will proceed with MRI breast before surgery. If she has significant residual disease on surgical path, I may offer adjuvant chemo with Xeloda.  -F/uweekly.  2.Symptom management: Full body aches,Constipation, Nausea,Body swelling -Secondary to chemo -She has full body aches secondary to Taxolwhich has increased. She uses ibuprofen(about 3 times a week)and muscle relaxer.I refilled today baclofen today (07/12/20) -Nausea well controlled on compazine. She did not tolerate zofran due to headaches.  -She has increased constipationwhich she usesMiralaxand use SITS bath after each BM. -For her mucositis she has magic mouthwash but leads to over numbing. She can use salt water rinses as well.  -She had 2-3 days episode of swelling in Lower and upper extremity and face at end of week 7.Probably related to chemo. She tried Lasix once but had significant N&V hours after. I discussed this is not a usual side effect, but she is fine to stop it.   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 She has very mild sensory deficit on exam in hands (right>left) (07/12/20).  -She is on Gabapentin for hot flashes, she can continue along with ice bags during infusion.  -Has not been present in later weeks of chemo CT.    4. Genetic Testingnegative  for pathogenetic mutations with VUS of POLE gene  5. Anxiety/Depression, Social Support  -She is currently on Xanax 70m TID and Celexa438m This is managed by her PCP. -Sheis willing to f/u with SW for counseling as needed. -She notes she has been stressed this past year and was binge drinking occasionally. She quit drinking 02/09/20 and does not plan to restart. She does not plan to restart smoking or vaping.  -She has very good social  supportfrom mother and boyfriend. 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  -She is on low dose Ambien for sleep.Stable.    6. Hot flashes -I discussed chemo will put her in perimenopause, so symptoms of hot flashes can happen. Last full period with start of chemo(03/2020)and mild period last month.  -Her hot flashes have significantly increasedon chemo and nearly constant impacting her sleep and daily active.  -She is on Celexa 80m currently.She is also onGabapentin 1042m   7. Dyspnea on exertion, tachycardia, deconditioning -secondary to chemo  -I encouraged her to exercise, and stay active. -Has not mentioned again today.   PLAN: -Labs reviewed and adequate to proceed withweek10CTat same dose as last week.  -Keytruda in2, 5 weeks.  -Lab, flush,f/uand carbo/taxolin 1, 2 weeks -breast MRI in 3 weeks then see Dr. WaDonne Hazelfter MRI  -will refill her baclofen   No problem-specific Assessment & Plan notes found for this encounter.   No orders of the defined types were placed in this encounter.  All questions were answered. The patient knows to call the clinic with any problems, questions or concerns. No barriers to learning was detected. The total time spent in the appointment was 30 minutes.     YaTruitt MerleMD 07/26/2020   I, AmJoslyn Devonam acting as scribe for YaTruitt MerleMD.   I have reviewed the above documentation for accuracy and  completeness, and I agree with the above.

## 2020-07-26 NOTE — Progress Notes (Signed)
Per Dr Feng, ok to treat with ANC 1.4 

## 2020-07-26 NOTE — Patient Instructions (Signed)
[facility] Discharge Instructions for Patients Receiving Chemotherapy  Today you received the following chemotherapy agents: paclitaxel and carboplatin.  To help prevent nausea and vomiting after your treatment, we encourage you to take your nausea medication as directed.   If you develop nausea and vomiting that is not controlled by your nausea medication, call the clinic.   BELOW ARE SYMPTOMS THAT SHOULD BE REPORTED IMMEDIATELY:  *FEVER GREATER THAN 100.5 F  *CHILLS WITH OR WITHOUT FEVER  NAUSEA AND VOMITING THAT IS NOT CONTROLLED WITH YOUR NAUSEA MEDICATION  *UNUSUAL SHORTNESS OF BREATH  *UNUSUAL BRUISING OR BLEEDING  TENDERNESS IN MOUTH AND THROAT WITH OR WITHOUT PRESENCE OF ULCERS  *URINARY PROBLEMS  *BOWEL PROBLEMS  UNUSUAL RASH Items with * indicate a potential emergency and should be followed up as soon as possible.  Feel free to call the clinic should you have any questions or concerns. The clinic phone number is [phone].  Please show the CHEMO ALERT CARD at check-in to the Emergency Department and triage nurse.   

## 2020-07-26 NOTE — Telephone Encounter (Signed)
Faxed referral to [facility] °

## 2020-07-27 ENCOUNTER — Other Ambulatory Visit: Payer: Self-pay | Admitting: *Deleted

## 2020-07-27 ENCOUNTER — Encounter: Payer: Self-pay | Admitting: Plastic Surgery

## 2020-07-27 DIAGNOSIS — Z171 Estrogen receptor negative status [ER-]: Secondary | ICD-10-CM

## 2020-07-27 DIAGNOSIS — C50112 Malignant neoplasm of central portion of left female breast: Secondary | ICD-10-CM

## 2020-07-27 LAB — T4: T4, Total: 4.4 ug/dL — ABNORMAL LOW (ref 4.5–12.0)

## 2020-07-28 ENCOUNTER — Encounter: Payer: Self-pay | Admitting: *Deleted

## 2020-07-28 ENCOUNTER — Telehealth: Payer: Self-pay | Admitting: Hematology

## 2020-07-28 ENCOUNTER — Telehealth: Payer: Self-pay | Admitting: *Deleted

## 2020-07-28 NOTE — Telephone Encounter (Signed)
Confirmed post neo appt with Dr. Donne Hazel on 2/9 at 4:15 to discuss sx options.

## 2020-07-28 NOTE — Telephone Encounter (Signed)
No 1/19 los. No changes made to pt's schedule.  

## 2020-07-31 NOTE — Progress Notes (Signed)
[place]   [phone] Fax:(336) [phone]   Clinic Follow up Note   Patient Care Team: [name], MD as PCP - General (Family Medicine) Mauro Kaufmann, RN as Oncology Nurse Navigator Rockwell Germany, RN as Oncology Nurse Navigator [name], MD as Consulting Physician (General Surgery) Truitt Merle, MD as Consulting Physician (Hematology)  Date of Service:  08/02/2020  CHIEF COMPLAINT: F/u of left breast cancer  SUMMARY OF ONCOLOGIC HISTORY: Oncology History Overview Note  Cancer Staging Cancer of central portion of left female breast [facility]) Staging form: Breast, AJCC 8th Edition - Clinical: No stage assigned - Unsigned    Cancer of central portion of left female breast ([place])  02/23/2020 Mammogram   Diagnostic Mammogram 02/23/20  IMPRESSION The 2x1x2.6cm irregular mass in teh left breast at 12:00 posiiton middle depth is highly suspicious of malignancy. An [place] is recommended for further evaluation and biopsy planning purposes.   02/23/2020 Breast US   [name] 02/23/20  IMPRESSION 2 adjacent spiculated masses in the left brast at 12:00 position 3 cm from the nipple (2.1x0.9x1.1cm and 1.1x1.4x0.5cm) is suggestive of malignancy.   Multiple abnormal left subpectoral and left axillary nodes measuring 3.3x2.1 cm concerning for metastatic adenopathy.    Left breast skin thickening and edema may be secondary congestive edema due to extensive axillary adenopathy.    03/08/2020 Initial Biopsy   Diagnosis 1.Breast, left, needle core biopsy, 12:00 position, 3cmfn -INVASIVE DUCTAL CARCINOMA -SEE COMMENT  2. Lymph node, needle/core biopsy, left axilla -METASTATIC CARCINOMA INVOLVING A LYMPH NODE  -LYMPHOVASCULAR SPACE INVASION PRESENT   Microscopic Comment  1.Based on the biopsy the carcinoma appears Nottingham Grade 3 or 3 and measures 1 cm in the greatest linear extent.    03/08/2020 Receptors her2   ER- Negative 0% PR - Negative  0% HER2 - Negative  KI 67 - 80%    03/08/2020 Cancer Staging   Staging form: Breast, AJCC 8th Edition - Clinical stage from 03/08/2020: Stage IIIB (cT2, cN1, cM0, G3, ER-, PR-, HER2-) - Signed by Truitt Merle, MD on 03/10/2020   03/10/2020 Initial Diagnosis   Cancer of central portion of left female breast ([place])   03/16/2020 Breast MRI   IMPRESSION: 1. 8.1 x 7.8 x 6.6 cm biopsy proven invasive ductal carcinoma in the central right breast, involving 3 quadrants. 2. 3.0 x 1.7 x 1.1 cm satellite mass more inferiorly in lower inner quadrant of the left breast, compatible with additional malignancy. 3. Metastatic level 1 and level 2 left axillary lymph nodes. 4. No evidence of malignancy on the right.   03/17/2020 Imaging   IMPRESSION: CT CAP w contrast  1. Diffuse skin thickening in the left breast with left axillary and subpectoral lymphadenopathy, as well as a mildly enlarged left supraclavicular lymph node, which likely represents metastatic lymphadenopathy. No other definite extra nodal metastatic disease noted elsewhere in the chest, abdomen or pelvis. 2. Large mass in the central anatomic pelvis which is of uncertain origin, potentially a large exophytic fibroid or a large solid mass arising from the right ovary. Further evaluation with pelvic ultrasound is strongly recommended.   03/21/2020 Imaging   Bone Scan  IMPRESSION: Apparent arthropathy at L5. No bony metastatic disease is demonstrable on this study. Scattered foci of abnormal uptake in a pelvic mass is of uncertain etiology given absence of calcification in this mass by CT. This mass compresses the urinary bladder. It is possible that some of the increased uptake in this  area actually represents physiologic uptake within the bladder.   Kidneys noted in flank positions bilaterally.     03/22/2020 -  Neo-Adjuvant Chemotherapy   Neoadjuvant Adriamycin and Cytoxan q2weeks for 4 cycles starting 03/22/20-05/03/20 followed by  weekly Taxol and Carboplatin for 12 weeks starting 05/17/20   03/24/2020 Imaging   US Pelvis  IMPRESSION: 1. Large pedunculated lesion directly contiguous with the uterine most suggestive of a large subserosal fibroid which measures up to 8.2 cm. 2. Additional 1 cm probable intramural fibroid in the right anterior uterine body. 3. No other acute or worrisome pelvic abnormality.   04/28/2020 -  Antibody Plan   Added Keytruda q3weeks starting 04/28/20 to complete 1 year of treatment       CURRENT THERAPY:  Neoadjuvant chemo ddACq2weeks for 4 cyclesstarting 03/22/20-05/03/20, followed by weekly carbo/taxolfor 12 weeksstarting 05/17/20. -Added Keytruda q3weeks to complete 1 year of treatmenton10/22/21  INTERVAL HISTORY:  Caitlyn Hendricks is here for a follow up. She presents to the clinic with her mother. She notes recently breathing in cold air has lead to a hoarse voice which started yesterday. She denies throat pain from this but has mouth sores that does cause her pain. Her current bout of mouth sores have been there for 4 days. She notes mild cough due to irration and her breathing is stable. She denies fever. She has been using remaining magic mouthwash but this also dries her mouth. These mouth sores have made it painful to eat. She notes mid to lower ribcage pain. She notes Tramadol will lead her to vomit so she has been using ibuprofen. She notes in recent days having discomfort of her left breast, similar to early days of diagnosis.    REVIEW OF SYSTEMS:   Constitutional: Denies fevers, chills or abnormal weight loss Eyes: Denies blurriness of vision Ears, nose, mouth, throat, and face: Denies mucositis or sore throat (+) Hoarse voice (+) mouth wash  Respiratory: Denies cough, dyspnea or wheezes Cardiovascular: Denies palpitation, chest discomfort or lower extremity swelling Gastrointestinal:  Denies nausea, heartburn or change in bowel habits Skin: Denies abnormal  skin rashes Lymphatics: Denies new lymphadenopathy or easy bruising Neurological:Denies numbness, tingling or new weaknesses Behavioral/Psych: Mood is stable, no new changes  All other systems were reviewed with the patient and are negative.  MEDICAL HISTORY:  Past Medical History:  Diagnosis Date  . Anxiety   . Depression     SURGICAL HISTORY: Past Surgical History:  Procedure Laterality Date  . PORTACATH PLACEMENT Right 03/21/2020   Procedure: INSERTION PORT-A-CATH WITH ULTRASOUND GUIDANCE;  Surgeon: [name], MD;  Location: [facility];  Service: General;  Laterality: Right;    I have reviewed the social history and family history with the patient and they are unchanged from previous note.  ALLERGIES:  is allergic to medroxyprogesterone.  MEDICATIONS:  Current Outpatient Medications  Medication Sig Dispense Refill  . ALPRAZolam (XANAX) 0.5 MG tablet Take 0.5 mg by mouth 3 (three) times daily.    Marland Kitchen ALPRAZolam (XANAX) 1 MG tablet Take 1 mg by mouth 3 (three) times daily as needed for anxiety.     Marland Kitchen azithromycin (ZITHROMAX) 250 MG tablet Take as prescribed 6 each 0  . baclofen (LIORESAL) 10 MG tablet Take 1 tablet (10 mg total) by mouth 2 (two) times daily as needed for muscle spasms. 30 each 0  . citalopram (CELEXA) 20 MG tablet Take 20 mg by mouth daily.    Marland Kitchen dexamethasone (DECADRON) 4 MG tablet Take 2  tablets by mouth daily starting the day after Carboplatin and Cytoxan x 3 days. Take with food. 30 tablet 1  . fluconazole (DIFLUCAN) 150 MG tablet Take 1 tablet for a yeast infection if needed, may repeat in 3 days if needed Use if needed while on antibiotic. 2 tablet 0  . fluticasone (FLONASE) 50 MCG/ACT nasal spray Place 2 sprays into both nostrils daily for 7 days. 15.8 mL 0  . furosemide (LASIX) 20 MG tablet Take 1 tablet (20 mg total) by mouth daily as needed for edema. 10 tablet 0  . gabapentin (NEURONTIN) 100 MG capsule Take 1 capsule (100 mg total)  by mouth 3 (three) times daily. May increase to 360m three times daily as need in a few weeks if tolerates well 90 capsule 0  . lidocaine-prilocaine (EMLA) cream Apply to affected area once 30 g 3  . methylPREDNISolone (MEDROL DOSEPAK) 4 MG TBPK tablet Take 1 tablet (4 mg total) by mouth taper from 4 doses each day to 1 dose and stop. 1 each 0  . omeprazole (PRILOSEC) 20 MG capsule Take 1 capsule (20 mg total) by mouth 2 (two) times daily before a meal. 60 capsule 2  . ondansetron (ZOFRAN) 8 MG tablet Take 1 tablet (8 mg total) by mouth 2 (two) times daily as needed. Start on the third day after chemotherapy. 30 tablet 2  . prochlorperazine (COMPAZINE) 10 MG tablet Take 1 tablet (10 mg total) by mouth every 6 (six) hours as needed (Nausea or vomiting). 30 tablet 2  . sertraline (ZOLOFT) 50 MG tablet Take by mouth.    . traMADol (ULTRAM) 50 MG tablet Take 1 tablet (50 mg total) by mouth daily as needed for severe pain (headache). 30 tablet 0  . triamcinolone ointment (KENALOG) 0.5 % Apply 1 application topically 2 (two) times daily. 30 g 2  . zolpidem (AMBIEN) 10 MG tablet Take 1 tablet (10 mg total) by mouth at bedtime as needed for sleep. 30 tablet 1   No current facility-administered medications for this visit.   Facility-Administered Medications Ordered in Other Visits  Medication Dose Route Frequency Provider Last Rate Last Admin  . sodium chloride flush (NS) 0.9 % injection 10 mL  10 mL Intravenous PRN BAlla Feeling NP   10 mL at 03/28/20 1104    PHYSICAL EXAMINATION: ECOG PERFORMANCE STATUS: 2 - Symptomatic, <50% confined to bed  Vitals:   08/02/20 0857  BP: 115/75  Pulse: 96  Resp: 16  Temp: 97.7 F (36.5 C)  SpO2: 100%   Filed Weights   08/02/20 0857  Weight: 210 lb 8 oz (95.5 kg)    Due to COVID19 we will limit examination to appearance. Patient had no complaints.  GENERAL:alert, no distress and comfortable SKIN: skin color normal, no rashes or significant  lesions EYES: normal, Conjunctiva are pink and non-injected, sclera clear  NEURO: alert & oriented x 3 with fluent speech  LABORATORY DATA:  I have reviewed the data as listed CBC Latest Ref Rng & Units 08/02/2020 07/26/2020 07/19/2020  WBC 4.0 - 10.5 K/uL 1.7(L) 2.4(L) 2.6(L)  Hemoglobin 12.0 - 15.0 g/dL 9.2(L) 9.5(L) 9.8(L)  Hematocrit 36.0 - 46.0 % 29.5(L) 30.3(L) 30.0(L)  Platelets 150 - 400 K/uL 290 245 255     CMP Latest Ref Rng & Units 08/02/2020 07/26/2020 07/19/2020  Glucose 70 - 99 mg/dL 101(H) 96 92  BUN 6 - 20 mg/dL _0 Creatinine 0.44 - 1.00 mg/dL 0.77 0.75 0.81  Sodium 135 -  145 mmol/L 140 140 139  Potassium 3.5 - 5.1 mmol/L 4.1 4.1 4.4  Chloride 98 - 111 mmol/L 108 107 108  CO2 22 - 32 mmol/L _0 Calcium 8.9 - 10.3 mg/dL 8.9 9.5 9.1  Total Protein 6.5 - 8.1 g/dL 6.5 7.0 7.1  Total Bilirubin 0.3 - 1.2 mg/dL 0.2(L) 0.3 0.3  Alkaline Phos 38 - 126 U/L 120 112 130(H)  AST 15 - 41 U/L 26 38 28  ALT 0 - 44 U/L 40 42 32      RADIOGRAPHIC STUDIES: I have personally reviewed the radiological images as listed and agreed with the findings in the report. No results found.   ASSESSMENT & PLAN:  Caitlyn Hendricks is a 40 y.o. 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 Her 03/08/20 biopsy showedshe has grade III invasive ductal carcinoma of left breast metastatic to her left axillary LN. Her ER/PR/HER2 markers were all negative.CT CAP/Bone scan negative for distant mets. -Surgery is the only curative option. She has consulted with Dr. Donne Hazel about surgery and left mastectomy was offered after neoadjuvant chemo. She is interested in b/l mastectomy and reconstruction for  symmetry. -To downstage disease and reduce risk of recurrence, I started her onneoadjuvant chemo with ddAC q2weeksfor 4 cycles beginning9/15/21-10/27/21. This wasfollowed by weekly carbo/taxolfor 12 weeksstarting 11/10/21before proceeding with surgery. -Based onrecently publish [id number] [id number] trial data,I added Keytruda q3weeks for 1 year treatment on 04/28/20. -Her left breast mass remains non-palpable since C2 and left axilla barely palpable s/p C3,she is clinically responding to chemotherapy.Plan breast MRI after last dose chemo before proceeding with surgery.If she has significant residual disease on surgical pathology I may recommend adjuvant Xeloda.  -She will proceed with post-mastectomy radiation given her positive LN. -s/p week 10 she has another flare of mouth sores. I reviewed management with her. No Neuropathy this week.  -Labs reviewed, WBC 1.7, Hg 9.2, ANC 0.8. Will proceed with week 11 with Taxol alone given mouth sores. Will give GCSF on day 3 -Continue chemo weekly and Keytrudaq3weeks. -Will proceed with MRI breast on 08/10/20 before surgery. -F/uweekly.  2.Symptom management: Full body aches,Constipation, Nausea,Body swelling -Secondary to chemo -She has full body aches secondary to Taxolwhich has increased. She uses ibuprofen(about 3 times a week)and muscle relaxer.She notes episode of vomiting after Tramadol.   -Nausea well controlled on compazine. She did not tolerate zofran due to headaches.  -She has increased constipationwhich she usesMiralaxand use SITS bath after each BM. -Her recent facial swelling has recently much improved or resolved.  -She has had intermittent mouth sores with a current flare. I will refill magic mouth wash (08/02/20).  -She has had hoarse voice since yesterday and ribcage pain. No true cough or fever. I recommend she gets COVID testing this week, she is agreeable.   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 She has very mild sensory deficit on exam in hands (right>left) (07/12/20).  -She is on Gabapentin for hot flashes, she can continue along with ice bags during infusion. -Has not been present in later weeks of chemo CT. Stable.   4. Genetic Testingnegative for pathogenetic mutations with VUS of POLE gene  5. Anxiety/Depression, Social Support  -She is currently on Xanax 43m TID  and Celexa74m. This is managed by her PCP. -Sheis willing to f/u with SW for counseling as needed. -She notes she has been stressed this past year and was binge drinking occasionally. She quit drinking 02/09/20 and does not plan to restart. She does not plan to restart smoking or vaping.  -She has very good social supportfrom mother and boyfriend. 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  -She is on low dose Ambien for sleep.Stable.    6. Hot flashes -I discussed chemo will put her in perimenopause, so symptoms of hot flashes can happen. Last full period with start of chemo(03/2020) -Her hot flashes have significantly increasedon chemo and nearly constant impacting her sleep and daily active.  -She is on Celexa 484mcurrently.She is also onGabapentin 10083m  7.Dyspnea on exertion, tachycardia, deconditioning -secondary to chemo  -I encouraged her to exercise, and stay active. -Her breathing is overall stable with occasional SOB upon exertion.   8. Hoarseness and mouth sore  -likely secondary to chemo -will get COVID test today or tomorrow   PLAN: -I will refill Magic mouthwash  -Labs reviewed and adequate to proceed withweek11 with Taxol alone today.  will hold car[place]otswanae to her neutropenia and mouth sores  -filgramstim onday 3  -Keytruda in1, 4 weeks. -Lab, flush,f/uand final carbo/taxolnext week -COVID test today or tomorrow  -breast MRI on 08/10/20 then see Dr.  WakDonne Hazelter MRI, will review in tumor board after MRI    No problem-specific Assessment & Plan notes found for this encounter.   No orders of the defined types were placed in this encounter.  All questions were answered. The patient knows to call the clinic with any problems, questions or concerns. No barriers to learning was detected. The total time spent in the appointment was 30 minutes.     YanTruitt MerleD 08/02/2020   I, AmoJoslyn Devonm acting as scribe for YanTruitt MerleD.   I have reviewed the above documentation for accuracy and completeness, and I agree with the above.

## 2020-08-02 ENCOUNTER — Encounter: Payer: Self-pay | Admitting: *Deleted

## 2020-08-02 ENCOUNTER — Inpatient Hospital Stay: Payer: 59

## 2020-08-02 ENCOUNTER — Other Ambulatory Visit: Payer: Self-pay

## 2020-08-02 ENCOUNTER — Inpatient Hospital Stay (HOSPITAL_BASED_OUTPATIENT_CLINIC_OR_DEPARTMENT_OTHER): Payer: 59 | Admitting: Hematology

## 2020-08-02 ENCOUNTER — Inpatient Hospital Stay (HOSPITAL_BASED_OUTPATIENT_CLINIC_OR_DEPARTMENT_OTHER): Payer: 59 | Admitting: Medical

## 2020-08-02 ENCOUNTER — Encounter: Payer: Self-pay | Admitting: Hematology

## 2020-08-02 VITALS — BP 115/75 | HR 96 | Temp 97.7°F | Resp 16 | Ht 64.0 in | Wt 210.5 lb

## 2020-08-02 DIAGNOSIS — K1379 Other lesions of oral mucosa: Secondary | ICD-10-CM | POA: Diagnosis not present

## 2020-08-02 DIAGNOSIS — Z5111 Encounter for antineoplastic chemotherapy: Secondary | ICD-10-CM | POA: Diagnosis not present

## 2020-08-02 DIAGNOSIS — C50112 Malignant neoplasm of central portion of left female breast: Secondary | ICD-10-CM

## 2020-08-02 DIAGNOSIS — Z171 Estrogen receptor negative status [ER-]: Secondary | ICD-10-CM

## 2020-08-02 DIAGNOSIS — R059 Cough, unspecified: Secondary | ICD-10-CM

## 2020-08-02 DIAGNOSIS — Z95828 Presence of other vascular implants and grafts: Secondary | ICD-10-CM

## 2020-08-02 LAB — CMP (CANCER CENTER ONLY)
ALT: 40 U/L (ref 0–44)
AST: 26 U/L (ref 15–41)
Albumin: 3.4 g/dL — ABNORMAL LOW (ref 3.5–5.0)
Alkaline Phosphatase: 120 U/L (ref 38–126)
Anion gap: 7 (ref 5–15)
BUN: 13 mg/dL (ref 6–20)
CO2: 25 mmol/L (ref 22–32)
Calcium: 8.9 mg/dL (ref 8.9–10.3)
Chloride: 108 mmol/L (ref 98–111)
Creatinine: 0.77 mg/dL (ref 0.44–1.00)
GFR, Estimated: >60 mL/min (ref >60)
Glucose, Bld: 101 mg/dL — ABNORMAL HIGH (ref 70–99)
Potassium: 4.1 mmol/L (ref 3.5–5.1)
Sodium: 140 mmol/L (ref 135–145)
Total Bilirubin: 0.2 mg/dL — ABNORMAL LOW (ref 0.3–1.2)
Total Protein: 6.5 g/dL (ref 6.5–8.1)

## 2020-08-02 LAB — CBC WITH DIFFERENTIAL (CANCER CENTER ONLY)
Abs Immature Granulocytes: 0.01 10*3/uL (ref 0.00–0.07)
Basophils Absolute: 0 10*3/uL (ref 0.0–0.1)
Basophils Relative: 1 %
Eosinophils Absolute: 0 10*3/uL (ref 0.0–0.5)
Eosinophils Relative: 1 %
HCT: 29.5 % — ABNORMAL LOW (ref 36.0–46.0)
Hemoglobin: 9.2 g/dL — ABNORMAL LOW (ref 12.0–15.0)
Immature Granulocytes: 1 %
Lymphocytes Relative: 31 %
Lymphs Abs: 0.5 10*3/uL — ABNORMAL LOW (ref 0.7–4.0)
MCH: 30.6 pg (ref 26.0–34.0)
MCHC: 31.2 g/dL (ref 30.0–36.0)
MCV: 98 fL (ref 80.0–100.0)
Monocytes Absolute: 0.3 10*3/uL (ref 0.1–1.0)
Monocytes Relative: 18 %
Neutro Abs: 0.8 10*3/uL — ABNORMAL LOW (ref 1.7–7.7)
Neutrophils Relative %: 48 %
Platelet Count: 290 10*3/uL (ref 150–400)
RBC: 3.01 MIL/uL — ABNORMAL LOW (ref 3.87–5.11)
RDW: 17.4 % — ABNORMAL HIGH (ref 11.5–15.5)
WBC Count: 1.7 10*3/uL — ABNORMAL LOW (ref 4.0–10.5)
nRBC: 0 % (ref 0.0–0.2)

## 2020-08-02 LAB — TSH: TSH: 1.16 u[IU]/mL (ref 0.308–3.960)

## 2020-08-02 LAB — SARS CORONAVIRUS 2 (TAT 6-24 HRS): SARS Coronavirus 2: POSITIVE — AB

## 2020-08-02 MED ORDER — SODIUM CHLORIDE 0.9% FLUSH
10.0000 mL | Freq: Once | INTRAVENOUS | Status: AC
  Administered 2020-08-02: 10 mL
  Filled 2020-08-02: qty 10

## 2020-08-02 MED ORDER — SODIUM CHLORIDE 0.9 % IV SOLN
Freq: Once | INTRAVENOUS | Status: AC
  Filled 2020-08-02: qty 250

## 2020-08-02 MED ORDER — PALONOSETRON HCL INJECTION 0.25 MG/5ML
INTRAVENOUS | Status: AC
  Filled 2020-08-02: qty 5

## 2020-08-02 MED ORDER — METHYLPREDNISOLONE SODIUM SUCC 40 MG IJ SOLR
20.0000 mg | Freq: Once | INTRAMUSCULAR | Status: AC
  Administered 2020-08-02: 20 mg via INTRAVENOUS

## 2020-08-02 MED ORDER — MAGIC MOUTHWASH W/LIDOCAINE: 5.0000 mL | Freq: Four times a day (QID) | ORAL | 1 refills | Status: DC | PRN

## 2020-08-02 MED ORDER — DIPHENHYDRAMINE HCL 50 MG/ML IJ SOLN
50.0000 mg | Freq: Once | INTRAMUSCULAR | Status: AC
  Administered 2020-08-02: 50 mg via INTRAVENOUS

## 2020-08-02 MED ORDER — FAMOTIDINE IN NACL 20-0.9 MG/50ML-% IV SOLN
20.0000 mg | Freq: Once | INTRAVENOUS | Status: AC
  Administered 2020-08-02: 20 mg via INTRAVENOUS

## 2020-08-02 MED ORDER — METHYLPREDNISOLONE SODIUM SUCC 40 MG IJ SOLR
INTRAMUSCULAR | Status: AC
  Filled 2020-08-02: qty 1

## 2020-08-02 MED ORDER — SODIUM CHLORIDE 0.9% FLUSH
10.0000 mL | INTRAVENOUS | Status: DC | PRN
  Administered 2020-08-02: 10 mL
  Filled 2020-08-02: qty 10

## 2020-08-02 MED ORDER — DIPHENHYDRAMINE HCL 50 MG/ML IJ SOLN
INTRAMUSCULAR | Status: AC
  Filled 2020-08-02: qty 1

## 2020-08-02 MED ORDER — FAMOTIDINE IN NACL 20-0.9 MG/50ML-% IV SOLN
INTRAVENOUS | Status: AC
  Filled 2020-08-02: qty 50

## 2020-08-02 MED ORDER — PALONOSETRON HCL INJECTION 0.25 MG/5ML
0.2500 mg | Freq: Once | INTRAVENOUS | Status: AC
  Administered 2020-08-02: 0.25 mg via INTRAVENOUS

## 2020-08-02 MED ORDER — SODIUM CHLORIDE 0.9 % IV SOLN
80.0000 mg/m2 | Freq: Once | INTRAVENOUS | Status: AC
  Administered 2020-08-02: 156 mg via INTRAVENOUS
  Filled 2020-08-02: qty 26

## 2020-08-02 MED ORDER — HEPARIN SOD (PORK) LOCK FLUSH 100 UNIT/ML IV SOLN
500.0000 [IU] | Freq: Once | INTRAVENOUS | Status: DC
  Filled 2020-08-02: qty 5

## 2020-08-02 MED ORDER — HEPARIN SOD (PORK) LOCK FLUSH 100 UNIT/ML IV SOLN
500.0000 [IU] | Freq: Once | INTRAVENOUS | Status: AC | PRN
  Administered 2020-08-02: 500 [IU]
  Filled 2020-08-02: qty 5

## 2020-08-02 MED FILL — CMPD MMW LID/MAALOX/DIPHEN: 24 days supply | Qty: 480 | Fill #0

## 2020-08-02 NOTE — Patient Instructions (Signed)

## 2020-08-03 ENCOUNTER — Other Ambulatory Visit: Payer: Self-pay | Admitting: Adult Health

## 2020-08-03 ENCOUNTER — Encounter: Payer: Self-pay | Admitting: Genetic Counselor

## 2020-08-03 ENCOUNTER — Encounter: Payer: Self-pay | Admitting: Adult Health

## 2020-08-03 ENCOUNTER — Telehealth: Payer: Self-pay

## 2020-08-03 DIAGNOSIS — C50112 Malignant neoplasm of central portion of left female breast: Secondary | ICD-10-CM

## 2020-08-03 DIAGNOSIS — U071 COVID-19: Secondary | ICD-10-CM

## 2020-08-03 DIAGNOSIS — Z1379 Encounter for other screening for genetic and chromosomal anomalies: Secondary | ICD-10-CM | POA: Insufficient documentation

## 2020-08-03 LAB — T4: T4, Total: 5 ug/dL (ref 4.5–12.0)

## 2020-08-03 NOTE — Progress Notes (Signed)
The patient was seen for COVID-19 testing after her chemotherapy treatment today.  Sandi Mealy, MHS, PA-C Physician Assistant

## 2020-08-03 NOTE — Progress Notes (Signed)
I connected by phone with Caitlyn Hendricks on 08/03/2020 at 11:21 AM to discuss the potential use of a new treatment for mild to moderate COVID-19 viral infection in non-hospitalized patients.  This patient is a 40 y.o. female that meets the FDA criteria for Emergency Use Authorization of COVID monoclonal antibody sotrovimab.  Has a (+) direct SARS-CoV-2 viral test result  Has mild or moderate COVID-19   Is NOT hospitalized due to COVID-19  Is within 10 days of symptom onset  Has at least one of the high risk factor(s) for progression to severe COVID-19 and/or hospitalization as defined in EUA.  Specific high risk criteria : Immunosuppressive Disease or Treatment   I have spoken and communicated the following to the patient or parent/caregiver regarding COVID monoclonal antibody treatment:  1. FDA has authorized the emergency use for the treatment of mild to moderate COVID-19 in adults and pediatric patients with positive results of direct SARS-CoV-2 viral testing who are 80 years of age and older weighing at least 40 kg, and who are at high risk for progressing to severe COVID-19 and/or hospitalization.  2. The significant known and potential risks and benefits of COVID monoclonal antibody, and the extent to which such potential risks and benefits are unknown.  3. Information on available alternative treatments and the risks and benefits of those alternatives, including clinical trials.  4. Patients treated with COVID monoclonal antibody should continue to self-isolate and use infection control measures (e.g., wear mask, isolate, social distance, avoid sharing personal items, clean and disinfect "high touch" surfaces, and frequent handwashing) according to CDC guidelines.   5. The patient or parent/caregiver has the option to accept or refuse COVID monoclonal antibody treatment.  6. She will receive Neulasta as well per Dr. [name] and I am placing this order today.    After reviewing this  information with the patient, the patient has agreed to receive one of the available covid 19 monoclonal antibodies and will be provided an appropriate fact sheet prior to infusion. Scot Dock, NP 08/03/2020 11:21 AM

## 2020-08-03 NOTE — Telephone Encounter (Signed)
Called to discuss with patient about COVID-19 symptoms and the use of one of the available treatments for those with mild to moderate Covid symptoms and at a high risk of hospitalization.  Pt appears to qualify for outpatient treatment due to co-morbid conditions and/or a member of an at-risk group in accordance with the FDA Emergency Use Authorization.    Symptom onset: Unknown Vaccinated: Yes Booster? Unknown Immunocompromised? Yes Qualifiers: Cancer  Unable to reach pt - Left message with call back number [phone].   Caitlyn Hendricks

## 2020-08-03 NOTE — Progress Notes (Incomplete)
Location of Breast Cancer: Central portion of left breast  Histology per Pathology Report: 03-08-2020     Diagnosis 1.Breast, left, needle core biopsy, 12:00 position, 3cmfn -INVASIVE DUCTAL CARCINOMA -SEE COMMENT  2. Lymph node, needle/core biopsy, left axilla -METASTATIC CARCINOMA INVOLVING A LYMPH NODE  -LYMPHOVASCULAR SPACE INVASION PRESENT   Microscopic Comment  1.Based on the biopsy the carcinoma appears Nottingham Grade 3 or 3 and measures 1 cm in the greatest linear extent.      Receptor Status: ER(Negative 0%), PR ( Negative 0%), Her2-neu ( Neg), Ki-(80%)  Did patient present with symptoms (if so, please note symptoms) or was this found on screening mammography?:   Past/Anticipated interventions by surgeon, if any  Past/Anticipated interventions by medical oncology, if any: Chemotherapy    Neo-Adjuvant Chemotherapy   Neoadjuvant Adriamycin and Cytoxan q2weeks for 4 cycles starting 03/22/20-05/03/20 followed by weekly Taxol and Carboplatin for 12 weeks starting 05/17/20    Antibody Plan   Added Keytruda q3weeks starting 04/28/20 to complete 1 year of treatment      Lymphedema issues, if any:   Pain issues, if any:    SAFETY ISSUES:  Prior radiation?   Pacemaker/ICD?   Possible current pregnancy?  Is the patient on methotrexate?   Current Complaints / other details:     Sherrlyn Hock, RN 08/03/2020,9:17 AM

## 2020-08-04 ENCOUNTER — Inpatient Hospital Stay: Payer: 59

## 2020-08-04 ENCOUNTER — Other Ambulatory Visit: Payer: Self-pay | Admitting: Hematology

## 2020-08-04 ENCOUNTER — Ambulatory Visit (HOSPITAL_COMMUNITY)
Admission: RE | Admit: 2020-08-04 | Discharge: 2020-08-04 | Disposition: A | Discharge status: Home / Self Care | Payer: 59 | Source: Ambulatory Visit | Attending: Pulmonary Disease | Admitting: Pulmonary Disease

## 2020-08-04 DIAGNOSIS — U071 COVID-19: Secondary | ICD-10-CM | POA: Insufficient documentation

## 2020-08-04 DIAGNOSIS — C50112 Malignant neoplasm of central portion of left female breast: Secondary | ICD-10-CM

## 2020-08-04 DIAGNOSIS — C50212 Malignant neoplasm of upper-inner quadrant of left female breast: Secondary | ICD-10-CM | POA: Insufficient documentation

## 2020-08-04 MED ORDER — FILGRASTIM-SNDZ 480 MCG/0.8ML IJ SOSY
480.0000 ug | PREFILLED_SYRINGE | Freq: Once | INTRAMUSCULAR | Status: AC
  Administered 2020-08-04: 480 ug via SUBCUTANEOUS
  Filled 2020-08-04: qty 0.8

## 2020-08-04 MED ORDER — FAMOTIDINE IN NACL 20-0.9 MG/50ML-% IV SOLN: 20.0000 mg | Freq: Once | INTRAVENOUS | Status: DC | PRN

## 2020-08-04 MED ORDER — SODIUM CHLORIDE 0.9 % IV SOLN: INTRAVENOUS | Status: DC | PRN

## 2020-08-04 MED ORDER — ALBUTEROL SULFATE HFA 108 (90 BASE) MCG/ACT IN AERS: 2.0000 | INHALATION_SPRAY | Freq: Once | RESPIRATORY_TRACT | Status: DC | PRN

## 2020-08-04 MED ORDER — PEGFILGRASTIM 6 MG/0.6ML ~~LOC~~ PSKT: 6.0000 mg | PREFILLED_SYRINGE | Freq: Once | SUBCUTANEOUS | Status: DC

## 2020-08-04 MED ORDER — SOTROVIMAB 500 MG/8ML IV SOLN
500.0000 mg | Freq: Once | INTRAVENOUS | Status: AC
  Administered 2020-08-04: 500 mg via INTRAVENOUS

## 2020-08-04 MED ORDER — METHYLPREDNISOLONE SODIUM SUCC 125 MG IJ SOLR: 125.0000 mg | Freq: Once | INTRAMUSCULAR | Status: DC | PRN

## 2020-08-04 MED ORDER — EPINEPHRINE 0.3 MG/0.3ML IJ SOAJ: 0.3000 mg | Freq: Once | INTRAMUSCULAR | Status: DC | PRN

## 2020-08-04 MED ORDER — DIPHENHYDRAMINE HCL 50 MG/ML IJ SOLN: 50.0000 mg | Freq: Once | INTRAMUSCULAR | Status: DC | PRN

## 2020-08-04 NOTE — Progress Notes (Addendum)
Patient reviewed Fact Sheet for Patients, Parents, and Caregivers for Emergency Use Authorization (EUA) of sotrovimab for the Treatment of Coronavirus. Patient also reviewed and is agreeable to the estimated cost of treatment. Patient is agreeable to proceed.   

## 2020-08-04 NOTE — Discharge Instructions (Signed)

## 2020-08-04 NOTE — Progress Notes (Signed)
Diagnosis: COVID-19  Physician: Dr. Patrick Wright  Procedure: Covid Infusion Clinic Med: Sotrovimab infusion - Provided patient with sotrovimab fact sheet for patients, parents, and caregivers prior to infusion.   Complications: No immediate complications noted  Discharge: Discharged home    

## 2020-08-05 ENCOUNTER — Other Ambulatory Visit: Payer: Self-pay | Admitting: Hematology

## 2020-08-07 ENCOUNTER — Telehealth: Payer: Self-pay | Admitting: *Deleted

## 2020-08-07 NOTE — Telephone Encounter (Signed)
Received vm call from pt as to what next step is with rescheduling appts.  She reports feeling much better since Covid treatment.  She was tested for Covid on 08/02/20 & had treatment 08/04/20.  She also reports a vaginal discharge that is white & a little odor with no itching.  She doesn't think it is yeast.  She has had it @ 1 week.  Message to Dr [name].

## 2020-08-08 ENCOUNTER — Telehealth: Payer: Self-pay | Admitting: *Deleted

## 2020-08-08 NOTE — Telephone Encounter (Signed)
Patient called to reschedule appt with Dr Sondra Come.  Advised she tested positive for Covid on 08/02/2020.  Routed schedule change to Network engineer.

## 2020-08-08 NOTE — Telephone Encounter (Signed)
Santiago Glad,  Please schedule her last chemo for 2/9 which will be 2 weeks from her COVID diagnosis, along with lab, flush and f/u. Please also check and make sure her breast MRI is at least 10 days after her COVID. Thanks   Truitt Merle MD

## 2020-08-09 ENCOUNTER — Inpatient Hospital Stay: Payer: 59 | Admitting: Hematology

## 2020-08-09 ENCOUNTER — Other Ambulatory Visit: Payer: 59

## 2020-08-09 ENCOUNTER — Inpatient Hospital Stay: Payer: 59

## 2020-08-09 ENCOUNTER — Ambulatory Visit: Admission: RE | Admit: 2020-08-09 | Payer: 59 | Source: Ambulatory Visit

## 2020-08-09 ENCOUNTER — Telehealth: Payer: Self-pay | Admitting: Hematology

## 2020-08-09 ENCOUNTER — Ambulatory Visit
Admission: RE | Admit: 2020-08-09 | Discharge: 2020-08-09 | Disposition: A | Discharge status: Home / Self Care | Payer: 59 | Source: Ambulatory Visit | Attending: Radiation Oncology | Admitting: Radiation Oncology

## 2020-08-09 DIAGNOSIS — Z171 Estrogen receptor negative status [ER-]: Secondary | ICD-10-CM

## 2020-08-09 NOTE — Progress Notes (Incomplete)
Radiation Oncology         [phone] ________________________________  Initial Outpatient Consultation  Name: Caitlyn Hendricks MRN: [id number]  Date: 08/09/2020  DOB: [birth year]  RC:BULAGTXM, Marshall Cork., MD  Truitt Merle, MD   REFERRING PHYSICIAN: Truitt Merle, MD  DIAGNOSIS: The encounter diagnosis was Malignant neoplasm of central portion of left breast in female, estrogen receptor negative ([place]).  Stage IIIB (cT2, cN1, cM0) Left Breast Multicentric, Invasive Ductal Carcinoma, ER- / PR- / Her2-, Grade 3  HISTORY OF PRESENT ILLNESS::Caitlyn Hendricks is a 40 y.o. female who is seen as a courtesy of Dr. [name] for an opinion concerning radiation therapy as part of management for her recently diagnosed breast cancer. Today, she is accompanied by ***. She underwent bilateral diagnostic mammography and left breast ultrasonography on 02/23/2020 for evaluation of left axillary lymph node pain/enlargement and left breast swelling/tenderness/redness. Results showed a 2.0 x 1.0 x 2.6 cm irregular mass in the left breast at the 12 o'clock position at middle depth, 6.4 cm from the nipple. There was also noted to be architectural distortion and calcifications associated with the mass. Finally, there was some left breast skin thickening.  Biopsy on the date of 03/08/2020 revealed grade 3 invasive ductal carcinoma. A left axillary lymph node was also biopsied and showed metastatic carcinoma with lymphovascular space invasion. Prognostic indicators were significant for: estrogen receptor 0% negative; progesterone receptor 0% negative; proliferation marker Ki67: 80%; Her2: negative; Grade: 3.  MRI of bilateral breasts on 03/16/2020 showed an 8.1 x 7.8 x 6.6 cm biopsy-proven invasive ductal carcinoma in the central left breast, involving three quadrants. There was an additional 3.0 x 1.7 x 1.1 cm satellite mass more inferiorly in the lower inner quadrant of the left breast that was compatible with an additional malignancy. Finally,  there were noted to be metastatic level 1 and level 2 left axillary lymph nodes. There was no evidence of malignancy on the right.  The patient underwent a CT scan of chest/abdomen/pelvis on 03/17/2020 that showed diffuse skin thickening in the left breast with left axillary and subpectoral lymphadenopathy as well as a mildly enlarged left supraclavicular lymph node, which likely represented metastatic lymphadenopathy. There was no other definite extra nodal metastatic disease noted elsewhere in the chest, abdomen, or pelvis. Of note, there was a large mass in the central anatomic pelvis, which was of uncertain origin and was potentially a large exophytic fibroid or a large solid mass arising from the right ovary. Further evaluation with pelvic ultrasound was strongly recommended.  Bone scan on 03/21/2020 showed apparent arthropathy at L5. No bony metastatic disease was demonstrable on the study. However, there was scattered foci of abnormal uptake in the pelvic mass that was of uncertain etiology. The mass compressed the urinary bladder; some of the increased uptake may actually have represented physiologic uptake within the bladder.  Transabdominal pelvis ultrasound on 03/24/2020 showed a large pedunculated lesion directly contiguous with the uterine, most suggestive of a large subserosal fibroid that measured up to 8.2 cm. There was an additional 1 cm probable intramural fibroid in the right anterior uterine body. There was no other acute or worrisome pelvic abnormality.  Subsequently, the patient was seen by Dr. [name] and underwent four cycles of neoadjuvant chemotherapy with Adriamycin and Cytoxan from 03/22/2020 - 05/03/2020. She was started on weekly Taxol and Carboplatin for 12 weeks on 05/17/2020.   PREVIOUS RADIATION THERAPY: No  PAST MEDICAL HISTORY:  Past Medical History:  Diagnosis Date  . Anxiety   .  Depression     PAST SURGICAL HISTORY: Past Surgical History:  Procedure Laterality  Date  . PORTACATH PLACEMENT Right 03/21/2020   Procedure: INSERTION PORT-A-CATH WITH ULTRASOUND GUIDANCE;  Surgeon: [name], MD;  Location: [place];  Service: General;  Laterality: Right;    FAMILY HISTORY:  Family History  Problem Relation Age of Onset  . Cancer Father        prostate cancer   . Cancer Maternal Grandmother 68       breast cancer    SOCIAL HISTORY:  Social History   Tobacco Use  . Smoking status: Never Smoker  . Smokeless tobacco: Former Network engineer Use Topics  . Drug use: Yes    Types: Marijuana    ALLERGIES:  Allergies  Allergen Reactions  . Medroxyprogesterone Other (See Comments)    Heavy Bleeding Heavy Bleeding     MEDICATIONS:  Current Outpatient Medications  Medication Sig Dispense Refill  . ALPRAZolam (XANAX) 0.5 MG tablet Take 0.5 mg by mouth 3 (three) times daily.    Marland Kitchen ALPRAZolam (XANAX) 1 MG tablet Take 1 mg by mouth 3 (three) times daily as needed for anxiety.     Marland Kitchen azithromycin (ZITHROMAX) 250 MG tablet Take as prescribed 6 each 0  . baclofen (LIORESAL) 10 MG tablet Take 1 tablet (10 mg total) by mouth 2 (two) times daily as needed for muscle spasms. 30 each 0  . citalopram (CELEXA) 20 MG tablet Take 20 mg by mouth daily.    Marland Kitchen dexamethasone (DECADRON) 4 MG tablet Take 2 tablets by mouth daily starting the day after Carboplatin and Cytoxan x 3 days. Take with food. 30 tablet 1  . fluconazole (DIFLUCAN) 150 MG tablet Take 1 tablet for a yeast infection if needed, may repeat in 3 days if needed Use if needed while on antibiotic. 2 tablet 0  . fluticasone (FLONASE) 50 MCG/ACT nasal spray Place 2 sprays into both nostrils daily for 7 days. 15.8 mL 0  . furosemide (LASIX) 20 MG tablet Take 1 tablet (20 mg total) by mouth daily as needed for edema. 10 tablet 0  . gabapentin (NEURONTIN) 100 MG capsule Take 1 capsule (100 mg total) by mouth 3 (three) times daily. May increase to 344m three times daily as need in a few  weeks if tolerates well 90 capsule 0  . lidocaine-prilocaine (EMLA) cream Apply to affected area once 30 g 3  . magic mouthwash w/lidocaine SOLN Take 5 mLs by mouth 4 (four) times daily as needed for mouth pain. 480 mL 1  . methylPREDNISolone (MEDROL DOSEPAK) 4 MG TBPK tablet Take 1 tablet (4 mg total) by mouth taper from 4 doses each day to 1 dose and stop. 1 each 0  . omeprazole (PRILOSEC) 20 MG capsule TAKE 1 CAPSULE (20 MG TOTAL) BY MOUTH 2 (TWO) TIMES DAILY BEFORE A MEAL. 180 capsule 1  . ondansetron (ZOFRAN) 8 MG tablet Take 1 tablet (8 mg total) by mouth 2 (two) times daily as needed. Start on the third day after chemotherapy. 30 tablet 2  . prochlorperazine (COMPAZINE) 10 MG tablet Take 1 tablet (10 mg total) by mouth every 6 (six) hours as needed (Nausea or vomiting). 30 tablet 2  . sertraline (ZOLOFT) 50 MG tablet Take by mouth.    . traMADol (ULTRAM) 50 MG tablet Take 1 tablet (50 mg total) by mouth daily as needed for severe pain (headache). 30 tablet 0  . triamcinolone ointment (KENALOG) 0.5 % Apply 1 application  topically 2 (two) times daily. 30 g 2  . zolpidem (AMBIEN) 10 MG tablet Take 1 tablet (10 mg total) by mouth at bedtime as needed for sleep. 30 tablet 1   No current facility-administered medications for this encounter.   Facility-Administered Medications Ordered in Other Encounters  Medication Dose Route Frequency Provider Last Rate Last Admin  . sodium chloride flush (NS) 0.9 % injection 10 mL  10 mL Intravenous PRN Alla Feeling, NP   10 mL at 03/28/20 1104    REVIEW OF SYSTEMS:  A 10+ POINT REVIEW OF SYSTEMS WAS OBTAINED including neurology, dermatology, psychiatry, cardiac, respiratory, lymph, extremities, GI, GU, musculoskeletal, constitutional, reproductive, HEENT. ***   PHYSICAL EXAM:  vitals were not taken for this visit.   General: Alert and oriented, in no acute distress HEENT: Head is normocephalic. Extraocular movements are intact. Oropharynx is  clear. Neck: Neck is supple, no palpable cervical or supraclavicular lymphadenopathy. Heart: Regular in rate and rhythm with no murmurs, rubs, or gallops. Chest: Clear to auscultation bilaterally, with no rhonchi, wheezes, or rales. Abdomen: Soft, nontender, nondistended, with no rigidity or guarding. Extremities: No cyanosis or edema. Lymphatics: see Neck Exam Skin: No concerning lesions. Musculoskeletal: symmetric strength and muscle tone throughout. Neurologic: Cranial nerves II through XII are grossly intact. No obvious focalities. Speech is fluent. Coordination is intact. Psychiatric: Judgment and insight are intact. Affect is appropriate. Right breast: No palpable mass, nipple discharge, or bleeding. Left breast: ***  ECOG = ***  0 - Asymptomatic (Fully active, able to carry on all predisease activities without restriction)  1 - Symptomatic but completely ambulatory (Restricted in physically strenuous activity but ambulatory and able to carry out work of a light or sedentary nature. For example, light housework, office work)  2 - Symptomatic, <50% in bed during the day (Ambulatory and capable of all self care but unable to carry out any work activities. Up and about more than 50% of waking hours)  3 - Symptomatic, >50% in bed, but not bedbound (Capable of only limited self-care, confined to bed or chair 50% or more of waking hours)  4 - Bedbound (Completely disabled. Cannot carry on any self-care. Totally confined to bed or chair)  5 - Death   Eustace Pen MM, Creech RH, Tormey DC, et al. [phone]). "Toxicity and response criteria of the [facility]". [place] Oncol. 5 (6): 649-55  LABORATORY DATA:  Lab Results  Component Value Date   WBC 1.7 (L) 08/02/2020   HGB 9.2 (L) 08/02/2020   HCT 29.5 (L) 08/02/2020   MCV 98.0 08/02/2020   PLT 290 08/02/2020   NEUTROABS 0.8 (L) 08/02/2020   Lab Results  Component Value Date   NA 140 08/02/2020   K 4.1  08/02/2020   CL 108 08/02/2020   CO2 25 08/02/2020   GLUCOSE 101 (H) 08/02/2020   CREATININE 0.77 08/02/2020   CALCIUM 8.9 08/02/2020      RADIOGRAPHY: ECHOCARDIOGRAM COMPLETE  Result Date: 07/11/2020    ECHOCARDIOGRAM REPORT   Patient Name:   Tykiera Brunette Date of Exam: 07/11/2020 Medical Rec #:  [id number]  Height:       64.0 in Accession #:    [id number] Weight:       211.1 lb Date of Birth:  1981/05/09  BSA:          2.002 m Patient Age:    39 years   BP:           121/71 mmHg Patient  Gender: F          HR:           105 bpm. Exam Location:  Outpatient Procedure: 2D Echo, Cardiac Doppler, Color Doppler, Strain Analysis and 3D Echo Indications:    Z51.11 Encounter for antineoplastic chemotheraphy  History:        Patient has prior history of Echocardiogram examinations, most                 recent 03/20/2020. Breast cancer.  Sonographer:    Jonelle Sidle Dance Referring Phys: [id number] [place]  1. Left ventricular ejection fraction, by estimation, is 60 to 65%. The left ventricle has normal function. The left ventricle has no regional wall motion abnormalities. Left ventricular diastolic parameters are indeterminate. The average left ventricular global longitudinal strain is -13.8 %. Global longitudinal strain is reduced from prior echo but appears to be poor tracking of the endocardium due to poor image quality in apical views.  2. Right ventricular systolic function is normal. The right ventricular size is normal. Tricuspid regurgitation signal is inadequate for assessing PA pressure.  3. A small pericardial effusion is present adjacent to RV free wall  4. The mitral valve is normal in structure. No evidence of mitral valve regurgitation.  5. The aortic valve was not well visualized. Aortic valve regurgitation is trivial. No aortic stenosis is present.  6. The inferior vena cava is normal in size with greater than 50% respiratory variability, suggesting right atrial pressure of 3 mmHg. FINDINGS   Left Ventricle: Left ventricular ejection fraction, by estimation, is 60 to 65%. The left ventricle has normal function. The left ventricle has no regional wall motion abnormalities. The average left ventricular global longitudinal strain is -13.8 %. The left ventricular internal cavity size was small. There is no left ventricular hypertrophy. Left ventricular diastolic parameters are indeterminate. Right Ventricle: The right ventricular size is normal. No increase in right ventricular wall thickness. Right ventricular systolic function is normal. Tricuspid regurgitation signal is inadequate for assessing PA pressure. Left Atrium: Left atrial size was normal in size. Right Atrium: Right atrial size was normal in size. Pericardium: A small pericardial effusion is present. Mitral Valve: The mitral valve is normal in structure. No evidence of mitral valve regurgitation. Tricuspid Valve: The tricuspid valve is normal in structure. Tricuspid valve regurgitation is not demonstrated. Aortic Valve: The aortic valve was not well visualized. Aortic valve regurgitation is trivial. No aortic stenosis is present. Pulmonic Valve: The pulmonic valve was grossly normal. Pulmonic valve regurgitation is trivial. Aorta: The aortic root and ascending aorta are structurally normal, with no evidence of dilitation. Venous: The inferior vena cava is normal in size with greater than 50% respiratory variability, suggesting right atrial pressure of 3 mmHg. IAS/Shunts: The interatrial septum was not well visualized.  LEFT VENTRICLE PLAX 2D LVIDd:         3.50 cm  Diastology LVIDs:         2.70 cm  LV e' lateral:   7.72 cm/s LV PW:         1.00 cm  LV E/e' lateral: 14.1 LV IVS:        0.90 cm LVOT diam:     1.70 cm  2D Longitudinal Strain LV SV:         30       2D Strain GLS Avg:     -13.8 % LV SV Index:   15 LVOT Area:     2.27  cm                          3D Volume EF:                         3D EF:        46 %                         LV  EDV:       136 ml                         LV ESV:       74 ml                         LV SV:        63 ml RIGHT VENTRICLE             IVC RV Basal diam:  2.40 cm     IVC diam: 1.30 cm RV S prime:     12.40 cm/s TAPSE (M-mode): 1.8 cm LEFT ATRIUM             Index       RIGHT ATRIUM          Index LA diam:        3.20 cm 1.60 cm/m  RA Area:     9.84 cm LA Vol (A2C):   28.1 ml 14.04 ml/m RA Volume:   18.40 ml 9.19 ml/m LA Vol (A4C):   10.3 ml 5.15 ml/m LA Biplane Vol: 17.7 ml 8.84 ml/m  AORTIC VALVE LVOT Vmax:   83.90 cm/s LVOT Vmean:  53.800 cm/s LVOT VTI:    0.132 m  AORTA Ao Root diam: 2.80 cm Ao Asc diam:  3.50 cm MITRAL VALVE MV Area (PHT): 5.25 cm     SHUNTS MV Decel Time: 145 msec     Systemic VTI:  0.13 m MV E velocity: 108.50 cm/s  Systemic Diam: 1.70 cm Oswaldo Milian MD Electronically signed by Oswaldo Milian MD Signature Date/Time: 07/11/2020/8:21:55 PM    Final       IMPRESSION: Stage IIIB (cT2, cN1, cM0) Left Breast Multicentric, Invasive Ductal Carcinoma, ER- / PR- / Her2-, Grade 3  ***  Today, I talked to the patient and family about the findings and work-up thus far.  We discussed the natural history of breast cancer and general treatment, highlighting the role of radiotherapy in the management.  We discussed the available radiation techniques, and focused on the details of logistics and delivery.  We reviewed the anticipated acute and late sequelae associated with radiation in this setting.  The patient was encouraged to ask questions that I answered to the best of my ability. *** A patient consent form was discussed and signed.  We retained a copy for our records.  The patient would like to proceed with radiation and will be scheduled for CT simulation.  PLAN: ***   Total time spent in this encounter was *** minutes which included reviewing the patient's most recent mammogram, left breast ultrasound, CT scans, pelvic US, bone scan, consultations, follow-ups, chemotherapy,  biopsies, pathology reports, physical examination, and documentation.  ------------------------------------------------  Blair Promise, PhD, MD  This document serves as a record of services personally performed by Gery Pray, MD. It was created on his behalf  by Clerance Lav, a trained medical scribe. The creation of this record is based on the scribe's personal observations and the provider's statements to them. This document has been checked and approved by the attending provider.

## 2020-08-09 NOTE — Progress Notes (Signed)
Caitlyn Hendricks states that she needs  to reschedule her consult with Dr. Sondra Come due to having Covid. Notified Shae in registration and Dr.Kinard.

## 2020-08-09 NOTE — Telephone Encounter (Signed)
Rescheduled appt per 2/2 sch msg - pt is aware of new appt on 2/9

## 2020-08-10 ENCOUNTER — Other Ambulatory Visit: Payer: 59

## 2020-08-10 ENCOUNTER — Encounter: Payer: Self-pay | Admitting: *Deleted

## 2020-08-10 ENCOUNTER — Telehealth: Payer: Self-pay | Admitting: *Deleted

## 2020-08-10 NOTE — Telephone Encounter (Signed)
Spoke to pt concerning future appts. Confirmed final chemo on 2/9, MRI on 2/10, and Dr. Donne Hazel on 2/11. Denies further needs or concerns.

## 2020-08-11 NOTE — Progress Notes (Signed)
[place]   [phone] Fax:([phone]) [phone]   Clinic Follow up Note   Patient Care Team: [name], MD as PCP - General (Family Medicine) Mauro Kaufmann, RN as Oncology Nurse Navigator Rockwell Germany, RN as Oncology Nurse Navigator [name], MD as Consulting Physician (General Surgery) Truitt Merle, MD as Consulting Physician (Hematology)  Date of Service:  08/16/2020  CHIEF COMPLAINT: F/u of left breast cancer  SUMMARY OF ONCOLOGIC HISTORY: Oncology History Overview Note  Cancer Staging Cancer of central portion of left female breast [facility]) Staging form: Breast, AJCC 8th Edition - Clinical: No stage assigned - Unsigned    Cancer of central portion of left female breast ([place])  02/23/2020 Mammogram   Diagnostic Mammogram 02/23/20  IMPRESSION The 2x1x2.6cm irregular mass in teh left breast at 12:00 posiiton middle depth is highly suspicious of malignancy. An [place] is recommended for further evaluation and biopsy planning purposes.   02/23/2020 Breast US   [name] 02/23/20  IMPRESSION 2 adjacent spiculated masses in the left brast at 12:00 position 3 cm from the nipple (2.1x0.9x1.1cm and 1.1x1.4x0.5cm) is suggestive of malignancy.   Multiple abnormal left subpectoral and left axillary nodes measuring 3.3x2.1 cm concerning for metastatic adenopathy.    Left breast skin thickening and edema may be secondary congestive edema due to extensive axillary adenopathy.    03/08/2020 Initial Biopsy   Diagnosis 1.Breast, left, needle core biopsy, 12:00 position, 3cmfn -INVASIVE DUCTAL CARCINOMA -SEE COMMENT  2. Lymph node, needle/core biopsy, left axilla -METASTATIC CARCINOMA INVOLVING A LYMPH NODE  -LYMPHOVASCULAR SPACE INVASION PRESENT   Microscopic Comment  1.Based on the biopsy the carcinoma appears Nottingham Grade 3 or 3 and measures 1 cm in the greatest linear extent.    03/08/2020 Receptors her2   ER- Negative 0% PR - Negative  0% HER2 - Negative  KI 67 - 80%    03/08/2020 Cancer Staging   Staging form: Breast, AJCC 8th Edition - Clinical stage from 03/08/2020: Stage IIIB (cT2, cN1, cM0, G3, ER-, PR-, HER2-) - Signed by Truitt Merle, MD on 03/10/2020   03/10/2020 Initial Diagnosis   Cancer of central portion of left female breast ([place])   03/16/2020 Breast MRI   IMPRESSION: 1. 8.1 x 7.8 x 6.6 cm biopsy proven invasive ductal carcinoma in the central right breast, involving 3 quadrants. 2. 3.0 x 1.7 x 1.1 cm satellite mass more inferiorly in lower inner quadrant of the left breast, compatible with additional malignancy. 3. Metastatic level 1 and level 2 left axillary lymph nodes. 4. No evidence of malignancy on the right.   03/17/2020 Imaging   IMPRESSION: CT CAP w contrast  1. Diffuse skin thickening in the left breast with left axillary and subpectoral lymphadenopathy, as well as a mildly enlarged left supraclavicular lymph node, which likely represents metastatic lymphadenopathy. No other definite extra nodal metastatic disease noted elsewhere in the chest, abdomen or pelvis. 2. Large mass in the central anatomic pelvis which is of uncertain origin, potentially a large exophytic fibroid or a large solid mass arising from the right ovary. Further evaluation with pelvic ultrasound is strongly recommended.   03/21/2020 Imaging   Bone Scan  IMPRESSION: Apparent arthropathy at L5. No bony metastatic disease is demonstrable on this study. Scattered foci of abnormal uptake in a pelvic mass is of uncertain etiology given absence of calcification in this mass by CT. This mass compresses the urinary bladder. It is possible that some of the increased uptake in this  area actually represents physiologic uptake within the bladder.   Kidneys noted in flank positions bilaterally.     03/22/2020 -  Neo-Adjuvant Chemotherapy   Neoadjuvant Adriamycin and Cytoxan q2weeks for 4 cycles starting 03/22/20-05/03/20 followed by  weekly Taxol and Carboplatin for 12 weeks starting 05/17/20   03/24/2020 Imaging   US Pelvis  IMPRESSION: 1. Large pedunculated lesion directly contiguous with the uterine most suggestive of a large subserosal fibroid which measures up to 8.2 cm. 2. Additional 1 cm probable intramural fibroid in the right anterior uterine body. 3. No other acute or worrisome pelvic abnormality.   03/29/2020 Genetic Testing   Negative genetic testing on the common hereditary cancer panel.  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  The Common Hereditary Gene Panel offered by [facility] includes sequencing and/or deletion duplication testing of the following 47 genes: APC, ATM, AXIN2, BARD1, BMPR1A, BRCA1, BRCA2, BRIP1, CDH1, CDK4, CDKN2A (p14ARF), CDKN2A (p16INK4a), CHEK2, CTNNA1, DICER1, EPCAM (Deletion/duplication testing only), GREM1 (promoter region deletion/duplication testing only), KIT, MEN1, MLH1, MSH2, MSH3, MSH6, MUTYH, NBN, NF1, NHTL1, PALB2, PDGFRA, PMS2, POLD1, POLE, PTEN, RAD50, RAD51C, RAD51D, SDHB, SDHC, SDHD, SMAD4, SMARCA4. STK11, TP53, TSC1, TSC2, and VHL.  The following genes were evaluated for sequence changes only: SDHA and HOXB13 c.251G>A variant only. The report date is 03/29/2020   04/28/2020 -  Antibody Plan   Added Keytruda q3weeks starting 04/28/20 to complete 1 year of treatment       CURRENT THERAPY:  Neoadjuvant chemo ddACq2weeks for 4 cyclesstarting 03/22/20-05/03/20, followed by weekly carbo/taxolfor 12 weeksstarting 05/17/20-08/16/20 -Added Keytruda q3weeks to complete 1 year of treatmenton10/22/21  INTERVAL HISTORY:  Caitlyn Hendricks is here for a follow up. She presents to the clinic alone.  She was diagnosed with Covid 2 weeks ago in our clinic, her symptoms (mild sinus congestion and hoarseness) resolved in a few days, and that she did received outpatient Covid treatment.  Chemo was held last week.  She is here for the last cycle of chemo.  She is doing well, mild fatigue,  no significant neuropathy, no other new complaints.  All other systems were reviewed with the patient and are negative.  MEDICAL HISTORY:  Past Medical History:  Diagnosis Date  . Anxiety   . Depression     SURGICAL HISTORY: Past Surgical History:  Procedure Laterality Date  . PORTACATH PLACEMENT Right 03/21/2020   Procedure: INSERTION PORT-A-CATH WITH ULTRASOUND GUIDANCE;  Surgeon: [name], MD;  Location: [facility];  Service: General;  Laterality: Right;    I have reviewed the social history and family history with the patient and they are unchanged from previous note.  ALLERGIES:  is allergic to medroxyprogesterone.  MEDICATIONS:  Current Outpatient Medications  Medication Sig Dispense Refill  . ALPRAZolam (XANAX) 0.5 MG tablet Take 0.5 mg by mouth 3 (three) times daily.    Marland Kitchen ALPRAZolam (XANAX) 1 MG tablet Take 1 mg by mouth 3 (three) times daily as needed for anxiety.     Marland Kitchen azithromycin (ZITHROMAX) 250 MG tablet Take as prescribed 6 each 0  . baclofen (LIORESAL) 10 MG tablet Take 1 tablet (10 mg total) by mouth 2 (two) times daily as needed for muscle spasms. 30 each 0  . citalopram (CELEXA) 20 MG tablet Take 20 mg by mouth daily.    Marland Kitchen dexamethasone (DECADRON) 4 MG tablet Take 2 tablets by mouth daily starting the day after Carboplatin and Cytoxan x 3 days. Take with food. 30 tablet 1  . fluconazole (DIFLUCAN) 150  MG tablet Take 1 tablet for a yeast infection if needed, may repeat in 3 days if needed Use if needed while on antibiotic. 2 tablet 0  . fluticasone (FLONASE) 50 MCG/ACT nasal spray Place 2 sprays into both nostrils daily for 7 days. 15.8 mL 0  . furosemide (LASIX) 20 MG tablet Take 1 tablet (20 mg total) by mouth daily as needed for edema. 10 tablet 0  . gabapentin (NEURONTIN) 100 MG capsule Take 1 capsule (100 mg total) by mouth 3 (three) times daily. May increase to 360m three times daily as need in a few weeks if tolerates well 90 capsule  0  . lidocaine-prilocaine (EMLA) cream Apply to affected area once 30 g 3  . magic mouthwash w/lidocaine SOLN Take 5 mLs by mouth 4 (four) times daily as needed for mouth pain. 480 mL 1  . methylPREDNISolone (MEDROL DOSEPAK) 4 MG TBPK tablet Take 1 tablet (4 mg total) by mouth taper from 4 doses each day to 1 dose and stop. 1 each 0  . metroNIDAZOLE (METROGEL) 0.75 % vaginal gel Place 1 Applicatorful vaginally 2 (two) times daily. 70 g 1  . omeprazole (PRILOSEC) 20 MG capsule TAKE 1 CAPSULE (20 MG TOTAL) BY MOUTH 2 (TWO) TIMES DAILY BEFORE A MEAL. 180 capsule 1  . ondansetron (ZOFRAN) 8 MG tablet Take 1 tablet (8 mg total) by mouth 2 (two) times daily as needed. Start on the third day after chemotherapy. 30 tablet 2  . prochlorperazine (COMPAZINE) 10 MG tablet Take 1 tablet (10 mg total) by mouth every 6 (six) hours as needed (Nausea or vomiting). 30 tablet 2  . sertraline (ZOLOFT) 50 MG tablet Take by mouth.    . traMADol (ULTRAM) 50 MG tablet Take 1 tablet (50 mg total) by mouth daily as needed for severe pain (headache). 30 tablet 0  . triamcinolone ointment (KENALOG) 0.5 % Apply 1 application topically 2 (two) times daily. 30 g 2  . zolpidem (AMBIEN) 10 MG tablet Take 1 tablet (10 mg total) by mouth at bedtime as needed for sleep. 30 tablet 1   No current facility-administered medications for this visit.   Facility-Administered Medications Ordered in Other Visits  Medication Dose Route Frequency Provider Last Rate Last Admin  . sodium chloride flush (NS) 0.9 % injection 10 mL  10 mL Intravenous PRN BAlla Feeling NP   10 mL at 03/28/20 1104  . sodium chloride flush (NS) 0.9 % injection 10 mL  10 mL Intracatheter PRN FTruitt Merle MD   10 mL at 08/16/20 1814    PHYSICAL EXAMINATION: ECOG PERFORMANCE STATUS: 1 - Symptomatic but completely ambulatory Blood pressure 134/85, heart rate 100, respirate 20, temperature 36.7, pulse ox 98% GENERAL:alert, no distress and comfortable SKIN: skin  color, texture, turgor are normal, no rashes or significant lesions EYES: normal, Conjunctiva are pink and non-injected, sclera clear NECK: supple, thyroid normal size, non-tender, without nodularity LYMPH:  no palpable lymphadenopathy in the cervical, axillary  LUNGS: clear to auscultation and percussion with normal breathing effort HEART: regular rate & rhythm and no murmurs and no lower extremity edema ABDOMEN:abdomen soft, non-tender and normal bowel sounds Musculoskeletal:no cyanosis of digits and no clubbing  NEURO: alert & oriented x 3 with fluent speech, no focal motor/sensory deficits  LABORATORY DATA:  I have reviewed the data as listed CBC Latest Ref Rng & Units 08/16/2020 08/02/2020 07/26/2020  WBC 4.0 - 10.5 K/uL 8.0 1.7(L) 2.4(L)  Hemoglobin 12.0 - 15.0 g/dL 10.0(L) 9.2(L) 9.5(L)  Hematocrit 36.0 - 46.0 % 31.4(L) 29.5(L) 30.3(L)  Platelets 150 - 400 K/uL 255 290 245     CMP Latest Ref Rng & Units 08/16/2020 08/02/2020 07/26/2020  Glucose 70 - 99 mg/dL 92 101(H) 96  BUN 6 - 20 mg/dL 19 13 16   Creatinine 0.44 - 1.00 mg/dL 0.75 0.77 0.75  Sodium 135 - 145 mmol/L 138 140 140  Potassium 3.5 - 5.1 mmol/L 4.1 4.1 4.1  Chloride 98 - 111 mmol/L 105 108 107  CO2 22 - 32 mmol/L 25 25 25   Calcium 8.9 - 10.3 mg/dL 9.0 8.9 9.5  Total Protein 6.5 - 8.1 g/dL 6.9 6.5 7.0  Total Bilirubin 0.3 - 1.2 mg/dL 0.3 0.2(L) 0.3  Alkaline Phos 38 - 126 U/L 152(H) 120 112  AST 15 - 41 U/L 19 26 38  ALT 0 - 44 U/L 20 40 42      RADIOGRAPHIC STUDIES: I have personally reviewed the radiological images as listed and agreed with the findings in the report. No results found.   ASSESSMENT & PLAN:  Makalyn Lennox is a 40 y.o. 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 Her 03/08/20 biopsy showedshe has grade III invasive ductal carcinoma of left breast metastatic to her left axillary LN. Her ER/PR/HER2 markers were all negative.CT CAP/Bone scan negative for distant mets. -Surgery is the only curative option. She has consulted with Dr. Donne Hazel about surgery and left mastectomy was offered after neoadjuvant chemo. She is interested in b/l mastectomy and reconstruction for symmetry. -To downstage disease and reduce risk of recurrence, I started her onneoadjuvant chemo with ddAC q2weeksfor 4 cycles beginning9/15/21-10/27/21. This wasfollowed by weekly carbo/taxolfor 12 weeksstarting 11/10/21before proceeding with surgery. -Based onrecently publish KCLEXNT 700 trial data,I added Keytruda q3weeks for 1 year treatment on 04/28/20. -Her left breast mass remains non-palpable since C2 and left axilla barely palpable s/p C3,she is clinically responding to chemotherapy.Plan breast MRI after last dose chemo before proceeding with surgery.If she has significant residual disease on surgical pathology I may recommend adjuvant Xeloda.  -She will proceed with post-mastectomy radiation given her positive LN. -Labs reviewed, Hg 10, CMP unremarkable.  Overall adequate to proceed with final cycle CT today and Keytruda.  -ContinueKeytrudaq3weeks to complete 1 year treatment -Will proceed with MRI breast on 08/17/20 and she will see Dr. Donne Hazel today after   2.Symptom management: Full body aches,Constipation, Nausea,Body swelling -Secondary to chemo -She has full body aches secondary to Taxolwhich has increased. She uses ibuprofen(about 3 times a week)and muscle relaxer.She notes episode of vomiting after Tramadol.   -Nausea well controlled on compazine. She did not tolerate zofran due to headaches.  -She has increased constipationwhich she usesMiralaxand use SITS bath after each BM. -Her  recent facial swelling has recently much improved or resolved.  -She has had intermittent mouth sores with a current flare. 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 She has very mild sensory deficit on exam in hands (right>left) (07/12/20).  -She is on Gabapentin for hot flashes, she can continue along with ice bags during infusion. -Has not been present in later weeks of chemo CT.Stable.   4. Genetic Testingnegative for pathogenetic mutations with VUS of POLE gene  5. Anxiety/Depression, Social  Support  -She is currently on Xanax 72m TID and Celexa465m This is managed by her PCP. -Sheis willing to f/u with SW for counseling as needed. -She notes she has been stressed this past year and was binge drinking occasionally. She quit drinking 02/09/20 and does not plan to restart. She does not plan to restart smoking or vaping.  -She has very good social supportfrom mother and boyfriend. 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  -She is on low dose Ambien for sleep.Stable.  6. Hot flashes -I discussed chemo will put her in perimenopause, so symptoms of hot flashes can happen. Last full period with start of chemo(03/2020) -Her hot flashes have significantly increasedon chemo and nearly constant impacting her sleep and daily active.  -She is on Celexa 4057murrently.She is also onGabapentin 100m41m 7.Dyspnea on exertion, tachycardia, deconditioning -secondary to chemo  -I encouraged her to exercise, and stay active. -Her breathing is overall stable with occasional SOB upon exertion.   8. COVID (+) on 08/02/20 Hoarseness   -She was initially symptomatic with voice hoarseness and ribcage pain. She then tested positive for COVID on 08/02/20.  -she has recovered quickly and completely     PLAN: -Labs reviewed and adequate  to proceed with final cycle CT today and Keytruda  -Keytruda in3, 6 weeks -breast MRI on 08/17/20 then see Dr. WakeDonne Hazeler MRI, will review in tumor board after MRI    No problem-specific Assessment & Plan notes found for this encounter.   Orders Placed This Encounter  Procedures  . Airborne and Contact precautions    Standing Status:   Standing    Number of Occurrences:   1   All questions were answered. The patient knows to call the clinic with any problems, questions or concerns. No barriers to learning was detected. The total time spent in the appointment was 30 minutes.     Caitlyn Hendricks Truitt Merle 08/16/2020   I, AmoyJoslyn Devon acting as scribe for Kenlea Woodell Truitt Merle.   I have reviewed the above documentation for accuracy and completeness, and I agree with the above.

## 2020-08-16 ENCOUNTER — Encounter: Payer: Self-pay | Admitting: *Deleted

## 2020-08-16 ENCOUNTER — Other Ambulatory Visit: Payer: 59

## 2020-08-16 ENCOUNTER — Inpatient Hospital Stay: Payer: 59

## 2020-08-16 ENCOUNTER — Inpatient Hospital Stay (HOSPITAL_BASED_OUTPATIENT_CLINIC_OR_DEPARTMENT_OTHER): Payer: 59 | Admitting: Hematology

## 2020-08-16 ENCOUNTER — Other Ambulatory Visit: Payer: Self-pay

## 2020-08-16 ENCOUNTER — Inpatient Hospital Stay: Payer: 59 | Attending: Hematology

## 2020-08-16 ENCOUNTER — Encounter: Payer: Self-pay | Admitting: Hematology

## 2020-08-16 VITALS — BP 134/85 | HR 100 | Temp 98.1°F | Resp 20 | Ht 64.0 in | Wt 210.0 lb

## 2020-08-16 DIAGNOSIS — Z8616 Personal history of COVID-19: Secondary | ICD-10-CM | POA: Insufficient documentation

## 2020-08-16 DIAGNOSIS — Z79899 Other long term (current) drug therapy: Secondary | ICD-10-CM | POA: Insufficient documentation

## 2020-08-16 DIAGNOSIS — Z9221 Personal history of antineoplastic chemotherapy: Secondary | ICD-10-CM | POA: Diagnosis not present

## 2020-08-16 DIAGNOSIS — Z5112 Encounter for antineoplastic immunotherapy: Secondary | ICD-10-CM | POA: Insufficient documentation

## 2020-08-16 DIAGNOSIS — N760 Acute vaginitis: Secondary | ICD-10-CM

## 2020-08-16 DIAGNOSIS — R000 Tachycardia, unspecified: Secondary | ICD-10-CM | POA: Insufficient documentation

## 2020-08-16 DIAGNOSIS — F418 Other specified anxiety disorders: Secondary | ICD-10-CM | POA: Diagnosis not present

## 2020-08-16 DIAGNOSIS — Z171 Estrogen receptor negative status [ER-]: Secondary | ICD-10-CM

## 2020-08-16 DIAGNOSIS — C50012 Malignant neoplasm of nipple and areola, left female breast: Secondary | ICD-10-CM | POA: Insufficient documentation

## 2020-08-16 DIAGNOSIS — C50112 Malignant neoplasm of central portion of left female breast: Secondary | ICD-10-CM | POA: Diagnosis not present

## 2020-08-16 DIAGNOSIS — Z5111 Encounter for antineoplastic chemotherapy: Secondary | ICD-10-CM | POA: Diagnosis present

## 2020-08-16 DIAGNOSIS — G620 Drug-induced polyneuropathy: Secondary | ICD-10-CM | POA: Insufficient documentation

## 2020-08-16 DIAGNOSIS — R0602 Shortness of breath: Secondary | ICD-10-CM | POA: Diagnosis not present

## 2020-08-16 DIAGNOSIS — R232 Flushing: Secondary | ICD-10-CM | POA: Diagnosis not present

## 2020-08-16 LAB — CBC WITH DIFFERENTIAL (CANCER CENTER ONLY)
Abs Immature Granulocytes: 0.02 10*3/uL (ref 0.00–0.07)
Basophils Absolute: 0 10*3/uL (ref 0.0–0.1)
Basophils Relative: 0 %
Eosinophils Absolute: 0 10*3/uL (ref 0.0–0.5)
Eosinophils Relative: 0 %
HCT: 31.4 % — ABNORMAL LOW (ref 36.0–46.0)
Hemoglobin: 10 g/dL — ABNORMAL LOW (ref 12.0–15.0)
Immature Granulocytes: 0 %
Lymphocytes Relative: 14 %
Lymphs Abs: 1.1 10*3/uL (ref 0.7–4.0)
MCH: 30.9 pg (ref 26.0–34.0)
MCHC: 31.8 g/dL (ref 30.0–36.0)
MCV: 96.9 fL (ref 80.0–100.0)
Monocytes Absolute: 0.7 10*3/uL (ref 0.1–1.0)
Monocytes Relative: 8 %
Neutro Abs: 6.2 10*3/uL (ref 1.7–7.7)
Neutrophils Relative %: 78 %
Platelet Count: 255 10*3/uL (ref 150–400)
RBC: 3.24 MIL/uL — ABNORMAL LOW (ref 3.87–5.11)
RDW: 17.3 % — ABNORMAL HIGH (ref 11.5–15.5)
WBC Count: 8 10*3/uL (ref 4.0–10.5)
nRBC: 0 % (ref 0.0–0.2)

## 2020-08-16 LAB — CMP (CANCER CENTER ONLY)
ALT: 20 U/L (ref 0–44)
AST: 19 U/L (ref 15–41)
Albumin: 3.6 g/dL (ref 3.5–5.0)
Alkaline Phosphatase: 152 U/L — ABNORMAL HIGH (ref 38–126)
Anion gap: 8 (ref 5–15)
BUN: 19 mg/dL (ref 6–20)
CO2: 25 mmol/L (ref 22–32)
Calcium: 9 mg/dL (ref 8.9–10.3)
Chloride: 105 mmol/L (ref 98–111)
Creatinine: 0.75 mg/dL (ref 0.44–1.00)
GFR, Estimated: >60 mL/min (ref >60)
Glucose, Bld: 92 mg/dL (ref 70–99)
Potassium: 4.1 mmol/L (ref 3.5–5.1)
Sodium: 138 mmol/L (ref 135–145)
Total Bilirubin: 0.3 mg/dL (ref 0.3–1.2)
Total Protein: 6.9 g/dL (ref 6.5–8.1)

## 2020-08-16 LAB — TSH: TSH: 0.881 u[IU]/mL (ref 0.308–3.960)

## 2020-08-16 MED ORDER — FAMOTIDINE IN NACL 20-0.9 MG/50ML-% IV SOLN
INTRAVENOUS | Status: AC
  Filled 2020-08-16: qty 50

## 2020-08-16 MED ORDER — DIPHENHYDRAMINE HCL 50 MG/ML IJ SOLN
50.0000 mg | Freq: Once | INTRAMUSCULAR | Status: AC
  Administered 2020-08-16: 50 mg via INTRAVENOUS

## 2020-08-16 MED ORDER — SODIUM CHLORIDE 0.9 % IV SOLN
200.0000 mg | Freq: Once | INTRAVENOUS | Status: AC
  Administered 2020-08-16: 200 mg via INTRAVENOUS
  Filled 2020-08-16: qty 8

## 2020-08-16 MED ORDER — PALONOSETRON HCL INJECTION 0.25 MG/5ML
0.2500 mg | Freq: Once | INTRAVENOUS | Status: AC
  Administered 2020-08-16: 0.25 mg via INTRAVENOUS

## 2020-08-16 MED ORDER — PALONOSETRON HCL INJECTION 0.25 MG/5ML
INTRAVENOUS | Status: AC
  Filled 2020-08-16: qty 5

## 2020-08-16 MED ORDER — FLUCONAZOLE 150 MG PO TABS: ORAL_TABLET | ORAL | 0 refills | Status: DC

## 2020-08-16 MED ORDER — SODIUM CHLORIDE 0.9 % IV SOLN
Freq: Once | INTRAVENOUS | Status: AC
  Filled 2020-08-16: qty 250

## 2020-08-16 MED ORDER — METRONIDAZOLE 0.75 % VA GEL: 1.0000 | Freq: Two times a day (BID) | VAGINAL | 1 refills | Status: DC

## 2020-08-16 MED ORDER — SODIUM CHLORIDE 0.9% FLUSH
10.0000 mL | INTRAVENOUS | Status: DC | PRN
  Administered 2020-08-16: 10 mL
  Filled 2020-08-16: qty 10

## 2020-08-16 MED ORDER — FAMOTIDINE IN NACL 20-0.9 MG/50ML-% IV SOLN
20.0000 mg | Freq: Once | INTRAVENOUS | Status: AC
  Administered 2020-08-16: 20 mg via INTRAVENOUS

## 2020-08-16 MED ORDER — SODIUM CHLORIDE 0.9 % IV SOLN
179.1600 mg | Freq: Once | INTRAVENOUS | Status: AC
  Administered 2020-08-16: 180 mg via INTRAVENOUS
  Filled 2020-08-16: qty 18

## 2020-08-16 MED ORDER — HEPARIN SOD (PORK) LOCK FLUSH 100 UNIT/ML IV SOLN
500.0000 [IU] | Freq: Once | INTRAVENOUS | Status: AC | PRN
  Administered 2020-08-16: 500 [IU]
  Filled 2020-08-16: qty 5

## 2020-08-16 MED ORDER — METHYLPREDNISOLONE SODIUM SUCC 40 MG IJ SOLR
20.0000 mg | Freq: Once | INTRAMUSCULAR | Status: AC
  Administered 2020-08-16: 20 mg via INTRAVENOUS

## 2020-08-16 MED ORDER — DIPHENHYDRAMINE HCL 50 MG/ML IJ SOLN
INTRAMUSCULAR | Status: AC
  Filled 2020-08-16: qty 1

## 2020-08-16 MED ORDER — METHYLPREDNISOLONE SODIUM SUCC 40 MG IJ SOLR
INTRAMUSCULAR | Status: AC
  Filled 2020-08-16: qty 1

## 2020-08-16 MED ORDER — SODIUM CHLORIDE 0.9 % IV SOLN
80.0000 mg/m2 | Freq: Once | INTRAVENOUS | Status: AC
  Administered 2020-08-16: 156 mg via INTRAVENOUS
  Filled 2020-08-16: qty 26

## 2020-08-16 NOTE — Patient Instructions (Signed)
[facility] Discharge Instructions for Patients Receiving Chemotherapy  Today you received the following chemotherapy agents Pembrolizumab (KEYTRUDA), Paclitaxel (TAXOL) & Carboplatin (PARAPLATIN).  To help prevent nausea and vomiting after your treatment, we encourage you to take your nausea medication as prescribed.   If you develop nausea and vomiting that is not controlled by your nausea medication, call the clinic.   BELOW ARE SYMPTOMS THAT SHOULD BE REPORTED IMMEDIATELY:  *FEVER GREATER THAN 100.5 F  *CHILLS WITH OR WITHOUT FEVER  NAUSEA AND VOMITING THAT IS NOT CONTROLLED WITH YOUR NAUSEA MEDICATION  *UNUSUAL SHORTNESS OF BREATH  *UNUSUAL BRUISING OR BLEEDING  TENDERNESS IN MOUTH AND THROAT WITH OR WITHOUT PRESENCE OF ULCERS  *URINARY PROBLEMS  *BOWEL PROBLEMS  UNUSUAL RASH Items with * indicate a potential emergency and should be followed up as soon as possible.  Feel free to call the clinic should you have any questions or concerns. The clinic phone number is [phone].  Please show the CHEMO ALERT CARD at check-in to the Emergency Department and triage nurse.   

## 2020-08-17 ENCOUNTER — Ambulatory Visit (HOSPITAL_COMMUNITY)
Admission: RE | Admit: 2020-08-17 | Discharge: 2020-08-17 | Disposition: A | Discharge status: Home / Self Care | Payer: 59 | Source: Ambulatory Visit | Attending: Hematology | Admitting: Hematology

## 2020-08-17 ENCOUNTER — Other Ambulatory Visit: Payer: Self-pay

## 2020-08-17 ENCOUNTER — Telehealth: Payer: Self-pay | Admitting: Hematology

## 2020-08-17 DIAGNOSIS — C50112 Malignant neoplasm of central portion of left female breast: Secondary | ICD-10-CM | POA: Insufficient documentation

## 2020-08-17 DIAGNOSIS — Z171 Estrogen receptor negative status [ER-]: Secondary | ICD-10-CM | POA: Diagnosis present

## 2020-08-17 IMAGING — MR MR BREAST BILAT WO/W CM
6 of 9 series · 29 of 48 positions shown · IV contrast (gadavist)
Comparison: [DATE]

CLINICAL DATA: Breast cancer, assess treatment response.

LABS:  None
EXAM:
BILATERAL BREAST MRI WITH AND WITHOUT CONTRAST
TECHNIQUE: Multiplanar, multisequence MR images of both breasts were obtained
prior to and following the intravenous administration of 9 ml of
Gadavist

[Series 3: T2 · axial · 3.0mm · 0.91mm/px · z∈[-84,+86]mm · 2 of 58 slices shown]
[im 1/58]
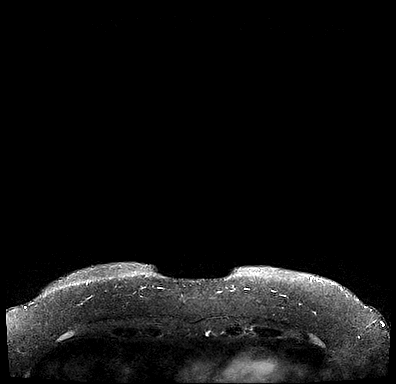
[im 58/58]
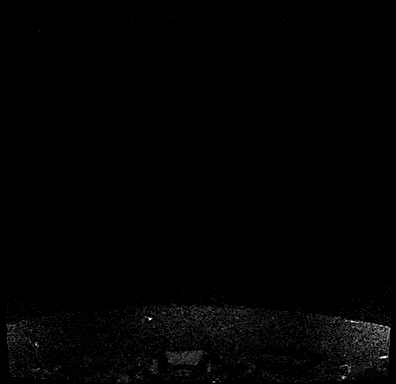

[Series 4: T1 fat-sat · axial · 1.2mm · 0.78mm/px · z∈[-75,+77]mm · 5 of 128 slices shown (1 of 4)]
[im 1/128]
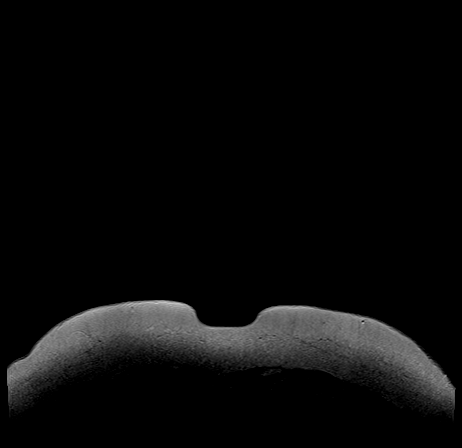
[im 32/128]
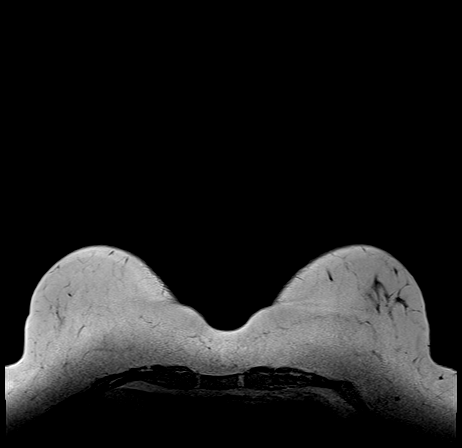
[im 64/128]
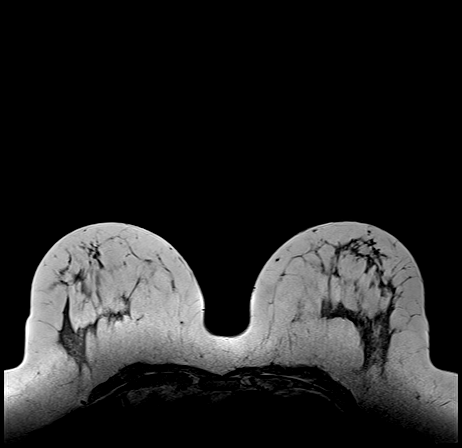
[im 96/128]
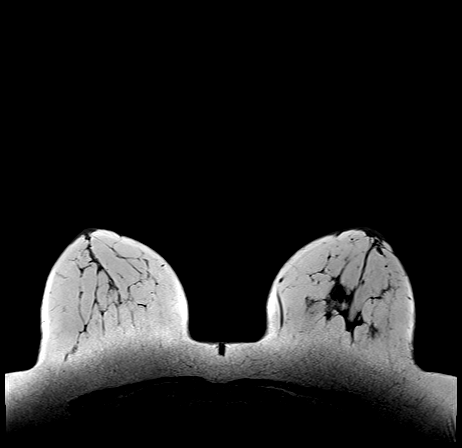
[im 128/128]
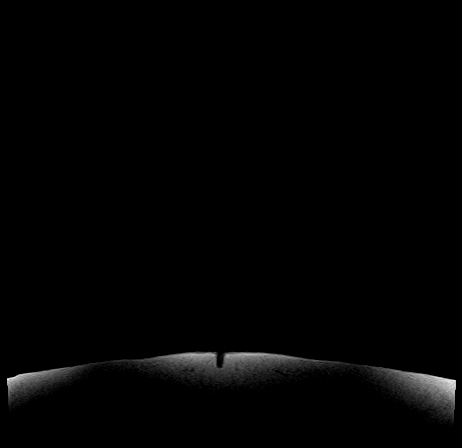

[Series 6: T1 fat-sat · axial · 1.6mm · 0.87mm/px · z∈[-88,+90]mm · 5 of 112 slices shown (2 of 4)]
[im 1/112]
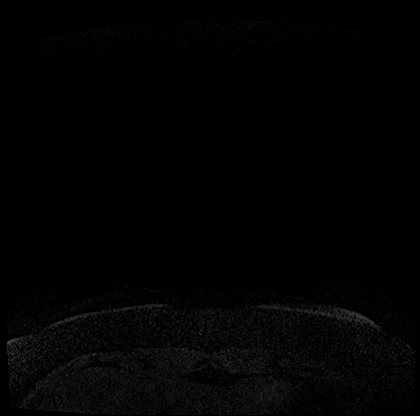
[im 28/112]
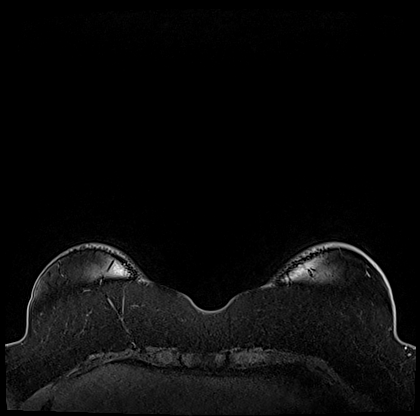
[im 56/112]
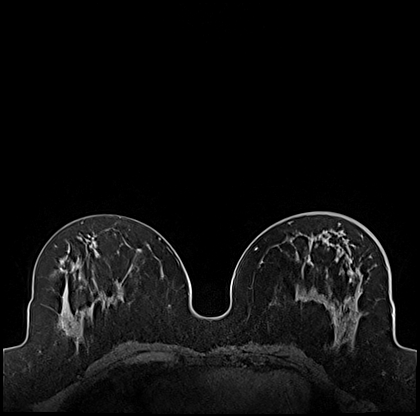
[im 84/112]
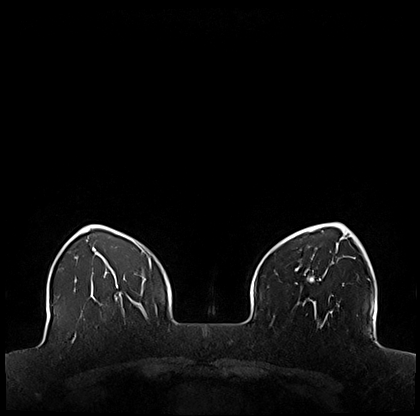
[im 112/112]
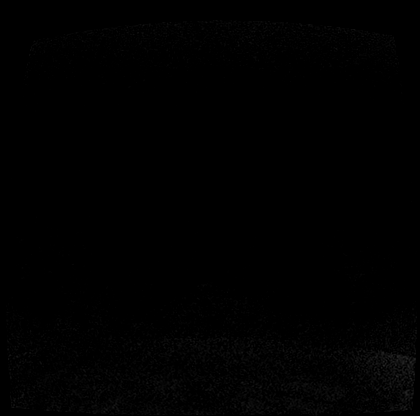

[Series 7: T1 fat-sat · axial · 1.6mm · 0.87mm/px · z∈[-88,+90]mm · 6 of 112 slices shown (3 of 4)]
[im 1/112]
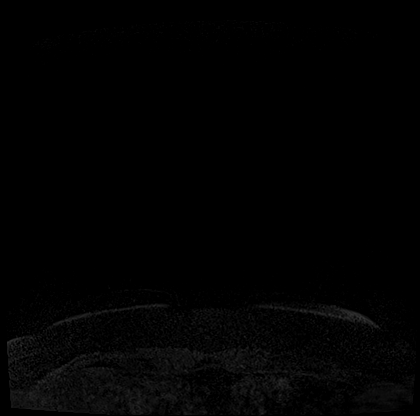
[im 23/112]
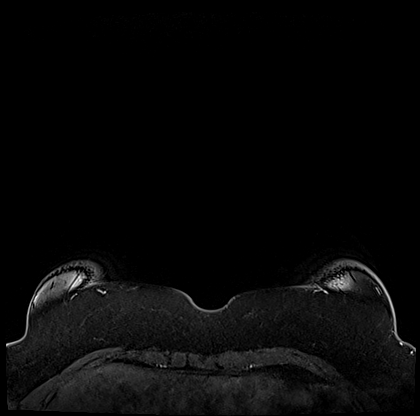
[im 45/112]
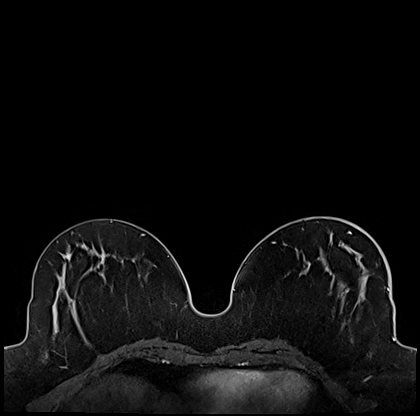
[im 67/112]
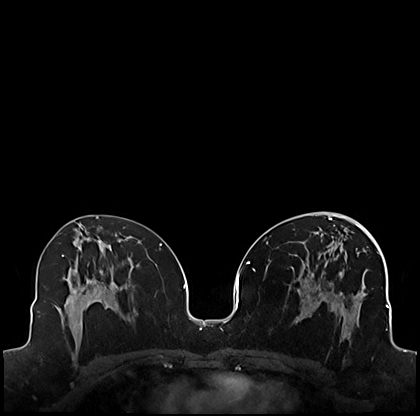
[im 89/112]
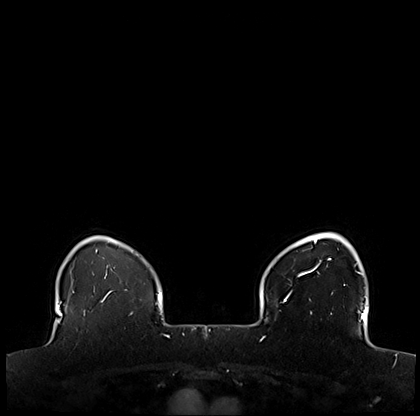
[im 112/112]
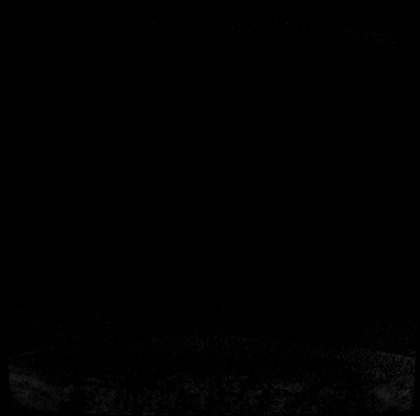

[Series 8: T1 · axial · 1.6mm · 0.87mm/px · z∈[-88,+90]mm · 6 of 112 slices shown]
[im 1/112]
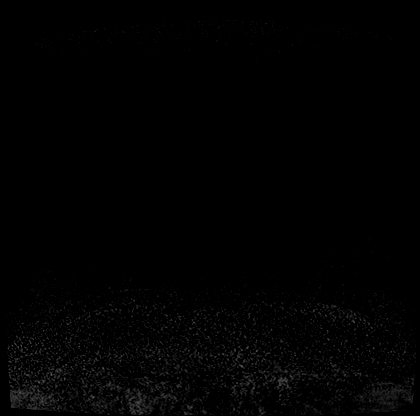
[im 23/112]
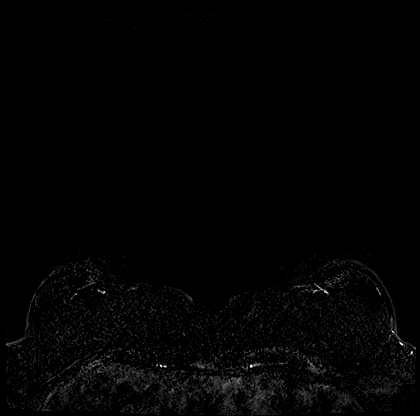
[im 45/112]
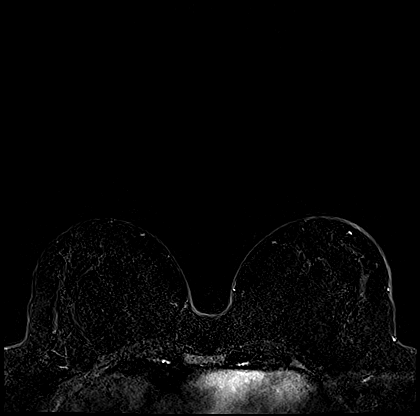
[im 67/112]
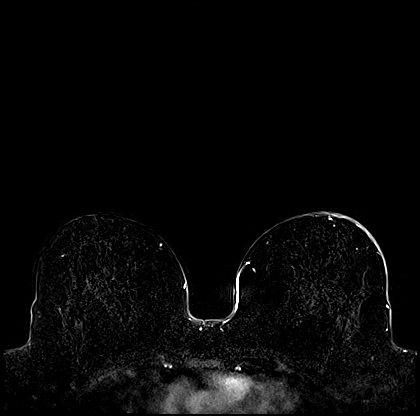
[im 89/112]
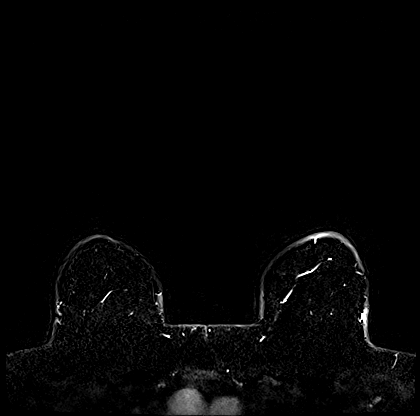
[im 112/112]
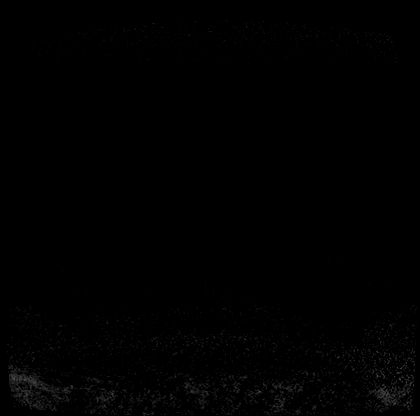

[Series 11: T1 fat-sat · axial · 1.6mm · 0.87mm/px · z∈[-88,+53]mm · 5 of 111 slices shown (4 of 4)]
[im 1/111]
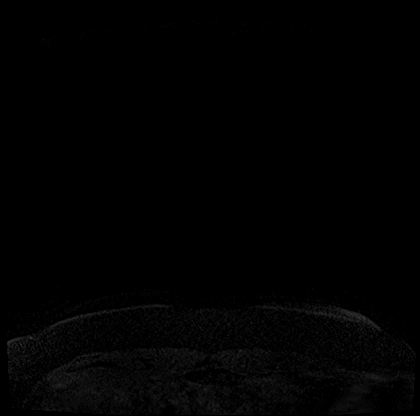
[im 23/111]
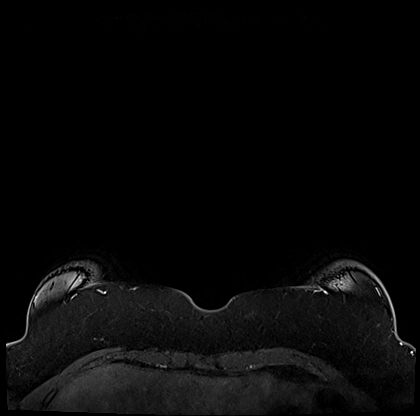
[im 45/111]
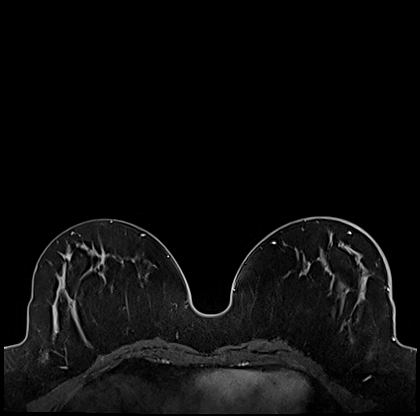
[im 67/111]
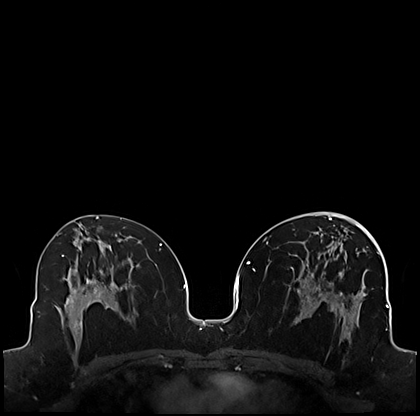
[im 89/111]
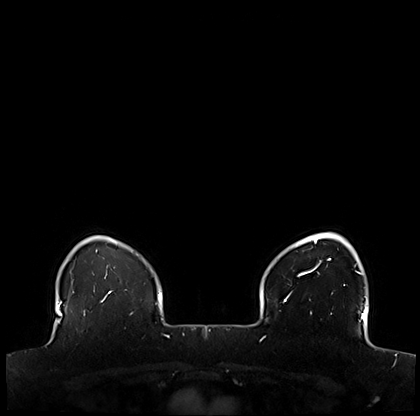

[29 of 48 positions shown; findings below may reference images not displayed]

Three-dimensional MR images were rendered by post-processing of the
original MR data on an independent workstation. The
three-dimensional MR images were interpreted, and findings are
reported in the following complete MRI report for this study. Three
dimensional images were evaluated at the independent interpreting
workstation using the DynaCAD thin client.
FINDINGS: Breast composition: c. Heterogeneous fibroglandular tissue.

Background parenchymal enhancement: Minimal

Right breast: No mass or abnormal enhancement.

Left breast: The biopsy proven malignancy in the left breast and the
adjacent satellite lesion have resolved in the interval. No abnormal
enhancement identified in the left breast.

Lymph nodes: The lymphadenopathy in the left axilla has resolved in
the interval.

Ancillary findings:  None.
IMPRESSION: 1. The biopsy proven malignancy in the left breast and the adjacent
satellite lesion have resolved in the interval. No abnormalities in
these locations today.
2. No MRI evidence of malignancy in the right breast.
3. No adenopathy identified today.

RECOMMENDATION:
Recommend continued surgical and oncologic follow up.

BI-RADS CATEGORY  6: Known biopsy-proven malignancy.

## 2020-08-17 MED ORDER — GADOBUTROL 1 MMOL/ML IV SOLN
9.0000 mL | Freq: Once | INTRAVENOUS | Status: AC | PRN
  Administered 2020-08-17: 9 mL via INTRAVENOUS

## 2020-08-17 NOTE — Progress Notes (Signed)
Location of Breast Cancer: Left Breast  Cancer of central portion of left female breast   Histology per Pathology Report:  03/16/21 The 1st impression should read as follows: 8.1 x 7.8 x 6.6 cm biopsy-proven invasive ductal carcinoma in the central left breast, involving a 3 quadrants. CLINICAL DATA:  Recently diagnosed invasive ductal carcinoma in the 12 o'clock position of the left breast and metastatic left axillary lymph node with lymphovascular invasion.  LABS:  None obtained on site today.  EXAM: BILATERAL BREAST MRI WITH AND WITHOUT CONTRAST  TECHNIQUE: Multiplanar, multisequence MR images of both breasts were obtained prior to and following the intravenous administration of 8 ml of Gadavist  Three-dimensional MR images were rendered by post-processing of the original MR data on an independent workstation. The three-dimensional MR images were interpreted, and findings are reported in the following complete MRI report for this study. Three dimensional images were evaluated at the independent interpreting workstation using the DynaCAD thin client.  COMPARISON:  Recent mammogram, ultrasound and biopsy examinations at [facility].  FINDINGS: Breast composition: c. Heterogeneous fibroglandular tissue.  Background parenchymal enhancement: Mild.  Right breast: No mass or abnormal enhancement.  Left breast: Large heterogeneously enhancing mass in the central left breast containing a biopsy marker clip artifact superomedially. This involves the upper outer, upper inner and lower inner quadrants of the breast and measures 8.1 x 7.8 x 6.6 cm. This has a mixture of enhancement kinetics, including rapid wash-in/washout.  Inferior to that mass, there is a 3.0 x 1.7 x 1.1 cm similar-appearing mass in the lower inner quadrant. Together, the masses span 9.3 cm in length.  Lymph nodes: Multiple enlarged level 1 and level 2 left axillary lymph nodes. The largest  is in the inferior aspect of the left axilla and contains a biopsy marker clip artifact. This node measures 3.6 x 2.5 cm on image number 39 series 5.  No enlarged internal mammary or right axillary lymph nodes.  Ancillary findings:  None.  IMPRESSION: 1. 8.1 x 7.8 x 6.6 cm biopsy proven invasive ductal carcinoma in the central right breast, involving 3 quadrants. 2. 3.0 x 1.7 x 1.1 cm satellite mass more inferiorly in lower inner quadrant of the left breast, compatible with additional malignancy. 3. Metastatic level 1 and level 2 left axillary lymph nodes. 4. No evidence of malignancy on the right.   Receptor Status: ER(0 NEG), PR ( 0 NEG), Her2-neu ), Ki-( 80% )  Did patient present with symptoms (if so, please note symptoms) or was this found on screening mammography?:self exam  Past/Anticipated interventions by surgeon, if any:  Past/Anticipated interventions by medical oncology, if any: Chemotherapy  Lymphedema issues, if any:  None  Pain issues, if any:   States that she has pain sometime  SAFETY ISSUES:  Prior radiation? No  Pacemaker/ICD?  No  Possible current pregnancy No  Is the patient on methotrexate?  no  Current Complaints / other details:   Vitals:   08/23/20 1350  BP: 107/82  Pulse: (!) 103  Resp: 18  Temp: 98 F (36.7 C)  TempSrc: Axillary  SpO2: 99%  Weight: 98 kg       Sherrlyn Hock, RN 08/17/2020,9:00 AM

## 2020-08-17 NOTE — Telephone Encounter (Signed)
Scheduled follow-up appointments per 2/9 los. Patient is aware. 

## 2020-08-18 ENCOUNTER — Encounter: Payer: Self-pay | Admitting: *Deleted

## 2020-08-21 ENCOUNTER — Encounter: Payer: Self-pay | Admitting: *Deleted

## 2020-08-23 ENCOUNTER — Inpatient Hospital Stay: Payer: 59

## 2020-08-23 ENCOUNTER — Ambulatory Visit
Admission: RE | Admit: 2020-08-23 | Discharge: 2020-08-23 | Disposition: A | Discharge status: Home / Self Care | Payer: 59 | Source: Ambulatory Visit | Attending: Radiation Oncology | Admitting: Radiation Oncology

## 2020-08-23 ENCOUNTER — Other Ambulatory Visit: Payer: Self-pay | Admitting: Hematology

## 2020-08-23 ENCOUNTER — Telehealth: Payer: Self-pay

## 2020-08-23 ENCOUNTER — Encounter: Payer: Self-pay | Admitting: Radiation Oncology

## 2020-08-23 ENCOUNTER — Other Ambulatory Visit: Payer: Self-pay

## 2020-08-23 VITALS — BP 107/82 | HR 103 | Temp 98.0°F | Resp 18 | Wt 216.0 lb

## 2020-08-23 DIAGNOSIS — Z171 Estrogen receptor negative status [ER-]: Secondary | ICD-10-CM | POA: Insufficient documentation

## 2020-08-23 DIAGNOSIS — Z79899 Other long term (current) drug therapy: Secondary | ICD-10-CM | POA: Insufficient documentation

## 2020-08-23 DIAGNOSIS — Z803 Family history of malignant neoplasm of breast: Secondary | ICD-10-CM | POA: Diagnosis not present

## 2020-08-23 DIAGNOSIS — C50112 Malignant neoplasm of central portion of left female breast: Secondary | ICD-10-CM | POA: Insufficient documentation

## 2020-08-23 DIAGNOSIS — R300 Dysuria: Secondary | ICD-10-CM

## 2020-08-23 DIAGNOSIS — C773 Secondary and unspecified malignant neoplasm of axilla and upper limb lymph nodes: Secondary | ICD-10-CM | POA: Diagnosis not present

## 2020-08-23 DIAGNOSIS — F418 Other specified anxiety disorders: Secondary | ICD-10-CM | POA: Diagnosis not present

## 2020-08-23 LAB — URINALYSIS, COMPLETE (UACMP) WITH MICROSCOPIC
Bilirubin Urine: NEGATIVE
Glucose, UA: NEGATIVE mg/dL
Ketones, ur: NEGATIVE mg/dL
Nitrite: NEGATIVE
Protein, ur: 30 mg/dL — AB
Specific Gravity, Urine: 1.014 (ref 1.005–1.030)
WBC, UA: >50 WBC/hpf — ABNORMAL HIGH (ref 0–5)
pH: 6 (ref 5.0–8.0)

## 2020-08-23 MED ORDER — CIPROFLOXACIN HCL 500 MG PO TABS: 500.0000 mg | ORAL_TABLET | Freq: Two times a day (BID) | ORAL | 0 refills | Status: DC

## 2020-08-23 NOTE — Progress Notes (Signed)
Radiation Oncology         [phone] ________________________________  Initial Outpatient Consultation  Name: Caitlyn Hendricks MRN: [id number]  Date: 08/23/2020  DOB: [birth year]  IW:OEHOZYYQ, Marshall Cork., MD  Truitt Merle, MD   REFERRING PHYSICIAN: Truitt Merle, MD  DIAGNOSIS: The encounter diagnosis was Malignant neoplasm of central portion of left breast in female, estrogen receptor negative ([place]).  Stage IIIB (cT2, cN1, cM0) Left Breast Multicentric, Invasive Ductal Carcinoma, ER- / PR- / Her2-, Grade 3 (inflammatory breast cancer)  HISTORY OF PRESENT ILLNESS::Caitlyn Hendricks is a 40 y.o. female who is seen as a courtesy of Dr. [name] for an opinion concerning radiation therapy as part of management for her recently diagnosed breast cancer. Today, she is accompanied by her mother. She underwent bilateral diagnostic mammography and left breast ultrasonography on 02/23/2020 for evaluation of left axillary lymph node pain/enlargement and left breast swelling/tenderness/redness. Results showed a 2.0 x 1.0 x 2.6 cm irregular mass in the left breast at the 12 o'clock position at middle depth, 6.4 cm from the nipple. There was also noted to be architectural distortion and calcifications associated with the mass. Finally, there was some left breast skin thickening.  Biopsy on the date of 03/08/2020 revealed grade 3 invasive ductal carcinoma. A left axillary lymph node was also biopsied and showed metastatic carcinoma with lymphovascular space invasion. Prognostic indicators were significant for: estrogen receptor 0% negative; progesterone receptor 0% negative; proliferation marker Ki67: 80%; Her2: negative; Grade: 3.  MRI of bilateral breasts on 03/16/2020 showed an 8.1 x 7.8 x 6.6 cm biopsy-proven invasive ductal carcinoma in the central left breast, involving three quadrants. There was an additional 3.0 x 1.7 x 1.1 cm satellite mass more inferiorly in the lower inner quadrant of the left breast that was compatible  with an additional malignancy. Finally, there were noted to be metastatic level 1 and level 2 left axillary lymph nodes. There was no evidence of malignancy on the right.  The patient underwent a CT scan of chest/abdomen/pelvis on 03/17/2020 that showed diffuse skin thickening in the left breast with left axillary and subpectoral lymphadenopathy as well as a mildly enlarged left supraclavicular lymph node, which likely represented metastatic lymphadenopathy. There was no other definite extra nodal metastatic disease noted elsewhere in the chest, abdomen, or pelvis. Of note, there was a large mass in the central anatomic pelvis, which was of uncertain origin and was potentially a large exophytic fibroid or a large solid mass arising from the right ovary. Further evaluation with pelvic ultrasound was strongly recommended.  Clinically the patient was felt to have inflammatory breast cancer with marked swelling of her left breast and peau d'orange changes.  In addition the patient described her breast being almost a purple color.  Bone scan on 03/21/2020 showed apparent arthropathy at L5. No bony metastatic disease was demonstrable on the study. However, there was scattered foci of abnormal uptake in the pelvic mass that was of uncertain etiology. The mass compressed the urinary bladder; some of the increased uptake may actually have represented physiologic uptake within the bladder.  Transabdominal pelvis ultrasound on 03/24/2020 showed a large pedunculated lesion directly contiguous with the uterine, most suggestive of a large subserosal fibroid that measured up to 8.2 cm. There was an additional 1 cm probable intramural fibroid in the right anterior uterine body. There was no other acute or worrisome pelvic abnormality.  Subsequently, the patient was seen by Dr. [name] and underwent four cycles of neoadjuvant chemotherapy with Adriamycin and Cytoxan  from 03/22/2020 - 05/03/2020. She also completed twelve  cycles of weekly Taxol and Carboplatin from 05/17/2020 - 08/16/2020.    PREVIOUS RADIATION THERAPY: No  PAST MEDICAL HISTORY:  Past Medical History:  Diagnosis Date  . Anxiety   . Depression     PAST SURGICAL HISTORY: Past Surgical History:  Procedure Laterality Date  . PORTACATH PLACEMENT Right 03/21/2020   Procedure: INSERTION PORT-A-CATH WITH ULTRASOUND GUIDANCE;  Surgeon: [name], MD;  Location: [facility];  Service: General;  Laterality: Right;    FAMILY HISTORY:  Family History  Problem Relation Age of Onset  . Cancer Father        prostate cancer   . Cancer Maternal Grandmother 68       breast cancer    SOCIAL HISTORY:  Social History   Tobacco Use  . Smoking status: Never Smoker  . Smokeless tobacco: Former Network engineer Use Topics  . Drug use: Yes    Types: Marijuana    ALLERGIES:  Allergies  Allergen Reactions  . Medroxyprogesterone Other (See Comments)    Heavy Bleeding Heavy Bleeding     MEDICATIONS:  Current Outpatient Medications  Medication Sig Dispense Refill  . ALPRAZolam (XANAX) 1 MG tablet Take 1 mg by mouth 3 (three) times daily as needed for anxiety.     Marland Kitchen azithromycin (ZITHROMAX) 250 MG tablet Take as prescribed 6 each 0  . baclofen (LIORESAL) 10 MG tablet Take 1 tablet (10 mg total) by mouth 2 (two) times daily as needed for muscle spasms. 30 each 0  . citalopram (CELEXA) 20 MG tablet Take 20 mg by mouth daily.    Marland Kitchen dexamethasone (DECADRON) 4 MG tablet Take 2 tablets by mouth daily starting the day after Carboplatin and Cytoxan x 3 days. Take with food. 30 tablet 1  . fluconazole (DIFLUCAN) 150 MG tablet Take 1 tablet for a yeast infection if needed, may repeat in 3 days if needed Use if needed while on antibiotic. 2 tablet 0  . furosemide (LASIX) 20 MG tablet Take 1 tablet (20 mg total) by mouth daily as needed for edema. 10 tablet 0  . gabapentin (NEURONTIN) 100 MG capsule Take 1 capsule (100 mg total) by  mouth 3 (three) times daily. May increase to 383m three times daily as need in a few weeks if tolerates well 90 capsule 0  . lidocaine-prilocaine (EMLA) cream Apply to affected area once 30 g 3  . magic mouthwash w/lidocaine SOLN Take 5 mLs by mouth 4 (four) times daily as needed for mouth pain. 480 mL 1  . methylPREDNISolone (MEDROL DOSEPAK) 4 MG TBPK tablet Take 1 tablet (4 mg total) by mouth taper from 4 doses each day to 1 dose and stop. 1 each 0  . metroNIDAZOLE (METROGEL) 0.75 % vaginal gel Place 1 Applicatorful vaginally 2 (two) times daily. 70 g 1  . omeprazole (PRILOSEC) 20 MG capsule TAKE 1 CAPSULE (20 MG TOTAL) BY MOUTH 2 (TWO) TIMES DAILY BEFORE A MEAL. 180 capsule 1  . ondansetron (ZOFRAN) 8 MG tablet Take 1 tablet (8 mg total) by mouth 2 (two) times daily as needed. Start on the third day after chemotherapy. 30 tablet 2  . prochlorperazine (COMPAZINE) 10 MG tablet Take 1 tablet (10 mg total) by mouth every 6 (six) hours as needed (Nausea or vomiting). 30 tablet 2  . sertraline (ZOLOFT) 50 MG tablet Take by mouth.    . traMADol (ULTRAM) 50 MG tablet Take 1 tablet (50 mg total)  by mouth daily as needed for severe pain (headache). 30 tablet 0  . triamcinolone ointment (KENALOG) 0.5 % Apply 1 application topically 2 (two) times daily. 30 g 2  . ALPRAZolam (XANAX) 0.5 MG tablet Take 0.5 mg by mouth 3 (three) times daily. (Patient not taking: Reported on 08/23/2020)    . ciprofloxacin (CIPRO) 500 MG tablet Take 1 tablet (500 mg total) by mouth 2 (two) times daily. 10 tablet 0  . fluticasone (FLONASE) 50 MCG/ACT nasal spray Place 2 sprays into both nostrils daily for 7 days. 15.8 mL 0  . zolpidem (AMBIEN) 10 MG tablet Take 1 tablet (10 mg total) by mouth at bedtime as needed for sleep. 30 tablet 1   No current facility-administered medications for this encounter.   Facility-Administered Medications Ordered in Other Encounters  Medication Dose Route Frequency Provider Last Rate Last Admin   . sodium chloride flush (NS) 0.9 % injection 10 mL  10 mL Intravenous PRN Alla Feeling, NP   10 mL at 03/28/20 1104    REVIEW OF SYSTEMS:  A 10+ POINT REVIEW OF SYSTEMS WAS OBTAINED including neurology, dermatology, psychiatry, cardiac, respiratory, lymph, extremities, GI, GU, musculoskeletal, constitutional, reproductive, HEENT.  Patient denies any pain within the left breast probable lymphadenopathy or swelling in her left arm or hand.  She denies any new bony pain headaches dizziness or blurred vision.  She reports a good appetite with chemotherapy and is gained approximately 35 pounds since initiation of chemotherapy.   PHYSICAL EXAM:  weight is 216 lb (98 kg). Her axillary temperature is 98 F (36.7 C). Her blood pressure is 107/82 and her pulse is 103 (abnormal). Her respiration is 18 and oxygen saturation is 99%.   General: Alert and oriented, in no acute distress HEENT: Head is normocephalic. Extraocular movements are intact.  Neck: Neck is supple, no palpable cervical or supraclavicular lymphadenopathy. Heart: Regular in rate and rhythm with no murmurs, rubs, or gallops. Chest: Clear to auscultation bilaterally, with no rhonchi, wheezes, or rales. Abdomen: Soft, nontender, nondistended, with no rigidity or guarding. Extremities: No cyanosis or edema. Lymphatics: see Neck Exam Skin: No concerning lesions. Musculoskeletal: symmetric strength and muscle tone throughout. Neurologic: Cranial nerves II through XII are grossly intact. No obvious focalities. Speech is fluent. Coordination is intact. Psychiatric: Judgment and insight are intact. Affect is appropriate. Right breast: No palpable mass, nipple discharge, or bleeding. Left breast: No palpable mass, nipple discharge or bleeding breast exam is completely normal at this time.  No signs of peau d'orange or enlargement of the breast compared to the right.  ECOG = 1  0 - Asymptomatic (Fully active, able to carry on all predisease  activities without restriction)  1 - Symptomatic but completely ambulatory (Restricted in physically strenuous activity but ambulatory and able to carry out work of a light or sedentary nature. For example, light housework, office work)  2 - Symptomatic, <50% in bed during the day (Ambulatory and capable of all self care but unable to carry out any work activities. Up and about more than 50% of waking hours)  3 - Symptomatic, >50% in bed, but not bedbound (Capable of only limited self-care, confined to bed or chair 50% or more of waking hours)  4 - Bedbound (Completely disabled. Cannot carry on any self-care. Totally confined to bed or chair)  5 - Death   Eustace Pen MM, Creech RH, Tormey DC, et al. [phone]). "Toxicity and response criteria of the [facility]". [place] Oncol.  5 (6): 649-55  LABORATORY DATA:  Lab Results  Component Value Date   WBC 8.0 08/16/2020   HGB 10.0 (L) 08/16/2020   HCT 31.4 (L) 08/16/2020   MCV 96.9 08/16/2020   PLT 255 08/16/2020   NEUTROABS 6.2 08/16/2020   Lab Results  Component Value Date   NA 138 08/16/2020   K 4.1 08/16/2020   CL 105 08/16/2020   CO2 25 08/16/2020   GLUCOSE 92 08/16/2020   CREATININE 0.75 08/16/2020   CALCIUM 9.0 08/16/2020      RADIOGRAPHY: MR BREAST BILATERAL W WO CONTRAST INC CAD  Result Date: 08/17/2020 CLINICAL DATA:  Breast cancer, assess treatment response. LABS:  None EXAM: BILATERAL BREAST MRI WITH AND WITHOUT CONTRAST TECHNIQUE: Multiplanar, multisequence MR images of both breasts were obtained prior to and following the intravenous administration of 9 ml of Gadavist Three-dimensional MR images were rendered by post-processing of the original MR data on an independent workstation. The three-dimensional MR images were interpreted, and findings are reported in the following complete MRI report for this study. Three dimensional images were evaluated at the independent interpreting workstation using the  DynaCAD thin client. COMPARISON:  March 16, 2020 FINDINGS: Breast composition: c. Heterogeneous fibroglandular tissue. Background parenchymal enhancement: Minimal Right breast: No mass or abnormal enhancement. Left breast: The biopsy proven malignancy in the left breast and the adjacent satellite lesion have resolved in the interval. No abnormal enhancement identified in the left breast. Lymph nodes: The lymphadenopathy in the left axilla has resolved in the interval. Ancillary findings:  None. IMPRESSION: 1. The biopsy proven malignancy in the left breast and the adjacent satellite lesion have resolved in the interval. No abnormalities in these locations today. 2. No MRI evidence of malignancy in the right breast. 3. No adenopathy identified today. RECOMMENDATION: Recommend continued surgical and oncologic follow up. BI-RADS CATEGORY  6: Known biopsy-proven malignancy. Electronically Signed   By: Dorise Bullion III M.D   On: 08/17/2020 17:13      IMPRESSION: Stage IIIB (cT2, cN1, cM0) Left Breast Multicentric, Invasive Ductal Carcinoma, ER- / PR- / Her2-, Grade 3 (inflammatory breast cancer)  Patient presented with clinical picture of inflammatory breast cancer.  She has undergone neoadjuvant chemotherapy with an excellent clinical and radiographic response.  Given the diffuse involvement of the breast and her diagnosis of inflammatory breast cancer,  the patient would not be a candidate for breast conserving surgery.  She has been scheduled for surgery with Dr. Donne Hazel and a left modified radical mastectomy is planned.  Given the triple negative situation and high risk for rapid recurrence the patient has decided not to proceed with tissue expanders so as not to delay initiation of postmastectomy radiation therapy.  Patient would  like to proceed with right mastectomy and bilateral reconstruction at a later date and will seek consultation at tertiary care centers at the appropriate time.  Today, I  talked to the patient and mother about the findings and work-up thus far.  We discussed the natural history of breast cancer and general treatment, highlighting the role of radiotherapy in the management.  We discussed the available radiation techniques, and focused on the details of logistics and delivery.  We reviewed the anticipated acute and late sequelae associated with radiation in this setting.  The patient was encouraged to ask questions that I answered to the best of my ability.   The patient would like to proceed with radiation and will be scheduled for CT simulation by the breast navigator program  approximately 4 weeks postop.  Treatments to hopefully begin within 6 weeks of her surgery assuming no complications with surgery or infection.  PLAN: Patient will proceed with postmastectomy radiation therapy.  anticipate 6 weeks of radiation treatment.  Total time spent in this encounter was 60 minutes which included reviewing the patient's most recent mammogram, left breast ultrasound, CT scans, pelvic US, bone scan, consultations, follow-ups, chemotherapy, biopsies, pathology reports, physical examination, and documentation.  ------------------------------------------------  Blair Promise, PhD, MD  This document serves as a record of services personally performed by Gery Pray, MD. It was created on his behalf by Clerance Lav, a trained medical scribe. The creation of this record is based on the scribe's personal observations and the provider's statements to them. This document has been checked and approved by the attending provider.

## 2020-08-23 NOTE — Telephone Encounter (Signed)
Caitlyn Hendricks called stating she has a UTI with burning and cloudy urine.  She is requesting an antibiotic.

## 2020-08-25 ENCOUNTER — Encounter: Payer: Self-pay | Admitting: Hematology

## 2020-08-25 LAB — URINE CULTURE: Culture: >=100000 — AB

## 2020-08-25 NOTE — Progress Notes (Signed)
Pthas been enrolledw/theMerckCopay Assistance programfor Keytruda for $25,000 from 07/08/20- 07/07/21.Hercopay for Keytruda will be $25.  

## 2020-09-06 ENCOUNTER — Other Ambulatory Visit: Payer: Self-pay | Admitting: Hematology

## 2020-09-06 NOTE — Progress Notes (Signed)
[place]   [phone] Fax:([phone]) [phone]   Clinic Follow up Note   Patient Care Team: [name], MD as PCP - General (Family Medicine) Mauro Kaufmann, RN as Oncology Nurse Navigator Rockwell Germany, RN as Oncology Nurse Navigator [name], MD as Consulting Physician (General Surgery) Truitt Merle, MD as Consulting Physician (Hematology)  Date of Service:  09/07/2020  CHIEF COMPLAINT: F/u of left breast cancer  SUMMARY OF ONCOLOGIC HISTORY: Oncology History Overview Note  Cancer Staging Cancer of central portion of left female breast [facility]) Staging form: Breast, AJCC 8th Edition - Clinical: No stage assigned - Unsigned    Cancer of central portion of left female breast ([place])  02/23/2020 Mammogram   Diagnostic Mammogram 02/23/20  IMPRESSION The 2x1x2.6cm irregular mass in teh left breast at 12:00 posiiton middle depth is highly suspicious of malignancy. An [place] is recommended for further evaluation and biopsy planning purposes.   02/23/2020 Breast US   [name] 02/23/20  IMPRESSION 2 adjacent spiculated masses in the left brast at 12:00 position 3 cm from the nipple (2.1x0.9x1.1cm and 1.1x1.4x0.5cm) is suggestive of malignancy.   Multiple abnormal left subpectoral and left axillary nodes measuring 3.3x2.1 cm concerning for metastatic adenopathy.    Left breast skin thickening and edema may be secondary congestive edema due to extensive axillary adenopathy.    03/08/2020 Initial Biopsy   Diagnosis 1.Breast, left, needle core biopsy, 12:00 position, 3cmfn -INVASIVE DUCTAL CARCINOMA -SEE COMMENT  2. Lymph node, needle/core biopsy, left axilla -METASTATIC CARCINOMA INVOLVING A LYMPH NODE  -LYMPHOVASCULAR SPACE INVASION PRESENT   Microscopic Comment  1.Based on the biopsy the carcinoma appears Nottingham Grade 3 or 3 and measures 1 cm in the greatest linear extent.    03/08/2020 Receptors her2   ER- Negative 0% PR - Negative  0% HER2 - Negative  KI 67 - 80%    03/08/2020 Cancer Staging   Staging form: Breast, AJCC 8th Edition - Clinical stage from 03/08/2020: Stage IIIB (cT2, cN1, cM0, G3, ER-, PR-, HER2-) - Signed by Truitt Merle, MD on 03/10/2020   03/10/2020 Initial Diagnosis   Cancer of central portion of left female breast ([place])   03/16/2020 Breast MRI   IMPRESSION: 1. 8.1 x 7.8 x 6.6 cm biopsy proven invasive ductal carcinoma in the central right breast, involving 3 quadrants. 2. 3.0 x 1.7 x 1.1 cm satellite mass more inferiorly in lower inner quadrant of the left breast, compatible with additional malignancy. 3. Metastatic level 1 and level 2 left axillary lymph nodes. 4. No evidence of malignancy on the right.   03/17/2020 Imaging   IMPRESSION: CT CAP w contrast  1. Diffuse skin thickening in the left breast with left axillary and subpectoral lymphadenopathy, as well as a mildly enlarged left supraclavicular lymph node, which likely represents metastatic lymphadenopathy. No other definite extra nodal metastatic disease noted elsewhere in the chest, abdomen or pelvis. 2. Large mass in the central anatomic pelvis which is of uncertain origin, potentially a large exophytic fibroid or a large solid mass arising from the right ovary. Further evaluation with pelvic ultrasound is strongly recommended.   03/21/2020 Imaging   Bone Scan  IMPRESSION: Apparent arthropathy at L5. No bony metastatic disease is demonstrable on this study. Scattered foci of abnormal uptake in a pelvic mass is of uncertain etiology given absence of calcification in this mass by CT. This mass compresses the urinary bladder. It is possible that some of the increased uptake in this  area actually represents physiologic uptake within the bladder.   Kidneys noted in flank positions bilaterally.     03/22/2020 - 08/16/2020 Neo-Adjuvant Chemotherapy   Neoadjuvant Adriamycin and Cytoxan q2weeks for 4 cycles starting 03/22/20-05/03/20 followed  by weekly Taxol and Carboplatin for 12 weeks starting 05/17/20-08/16/20   03/24/2020 Imaging   US Pelvis  IMPRESSION: 1. Large pedunculated lesion directly contiguous with the uterine most suggestive of a large subserosal fibroid which measures up to 8.2 cm. 2. Additional 1 cm probable intramural fibroid in the right anterior uterine body. 3. No other acute or worrisome pelvic abnormality.   03/29/2020 Genetic Testing   Negative genetic testing on the common hereditary cancer panel.  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  The Common Hereditary Gene Panel offered by [facility] includes sequencing and/or deletion duplication testing of the following 47 genes: APC, ATM, AXIN2, BARD1, BMPR1A, BRCA1, BRCA2, BRIP1, CDH1, CDK4, CDKN2A (p14ARF), CDKN2A (p16INK4a), CHEK2, CTNNA1, DICER1, EPCAM (Deletion/duplication testing only), GREM1 (promoter region deletion/duplication testing only), KIT, MEN1, MLH1, MSH2, MSH3, MSH6, MUTYH, NBN, NF1, NHTL1, PALB2, PDGFRA, PMS2, POLD1, POLE, PTEN, RAD50, RAD51C, RAD51D, SDHB, SDHC, SDHD, SMAD4, SMARCA4. STK11, TP53, TSC1, TSC2, and VHL.  The following genes were evaluated for sequence changes only: SDHA and HOXB13 c.251G>A variant only. The report date is 03/29/2020   04/28/2020 -  Antibody Plan   Added Keytruda q3weeks starting 04/28/20 to complete 1 year of treatment    08/17/2020 Breast MRI   IMPRESSION: 1. The biopsy proven malignancy in the left breast and the adjacent satellite lesion have resolved in the interval. No abnormalities in these locations today. 2. No MRI evidence of malignancy in the right breast. 3. No adenopathy identified today      CURRENT THERAPY:  Added Keytruda q3weekson10/22/21 to complete 1 year of treatment.  PENDING Surgery on 09/20/20  INTERVAL HISTORY:  Caitlyn Hendricks is here for a follow up. She presents to the clinic alone. Doing well overall, still has moderate fatigue, body ache has improved, no neuropathy or other residual side  effects from chemo Appetite is good, weight is stable   All other systems were reviewed with the patient and are negative.  MEDICAL HISTORY:  Past Medical History:  Diagnosis Date  . Anxiety   . Depression     SURGICAL HISTORY: Past Surgical History:  Procedure Laterality Date  . PORTACATH PLACEMENT Right 03/21/2020   Procedure: INSERTION PORT-A-CATH WITH ULTRASOUND GUIDANCE;  Surgeon: [name], MD;  Location: [place];  Service: General;  Laterality: Right;    I have reviewed the social history and family history with the patient and they are unchanged from previous note.  ALLERGIES:  is allergic to medroxyprogesterone.  MEDICATIONS:  Current Outpatient Medications  Medication Sig Dispense Refill  . ALPRAZolam (XANAX) 0.5 MG tablet Take 0.5 mg by mouth 3 (three) times daily. (Patient not taking: Reported on 08/23/2020)    . ALPRAZolam (XANAX) 1 MG tablet Take 1 mg by mouth 3 (three) times daily as needed for anxiety.     Marland Kitchen azithromycin (ZITHROMAX) 250 MG tablet Take as prescribed 6 each 0  . baclofen (LIORESAL) 10 MG tablet Take 1 tablet (10 mg total) by mouth 2 (two) times daily as needed for muscle spasms. 30 each 0  . ciprofloxacin (CIPRO) 500 MG tablet Take 1 tablet (500 mg total) by mouth 2 (two) times daily. 10 tablet 0  . citalopram (CELEXA) 20 MG tablet Take 20 mg by mouth daily.    Marland Kitchen  dexamethasone (DECADRON) 4 MG tablet Take 2 tablets by mouth daily starting the day after Carboplatin and Cytoxan x 3 days. Take with food. 30 tablet 1  . fluconazole (DIFLUCAN) 150 MG tablet Take 1 tablet for a yeast infection if needed, may repeat in 3 days if needed Use if needed while on antibiotic. 2 tablet 0  . fluticasone (FLONASE) 50 MCG/ACT nasal spray Place 2 sprays into both nostrils daily for 7 days. 15.8 mL 0  . furosemide (LASIX) 20 MG tablet Take 1 tablet (20 mg total) by mouth daily as needed for edema. 10 tablet 0  . gabapentin (NEURONTIN) 100 MG  capsule Take 1 capsule (100 mg total) by mouth 3 (three) times daily. May increase to 366m three times daily as need in a few weeks if tolerates well 90 capsule 0  . lidocaine-prilocaine (EMLA) cream Apply to affected area once 30 g 3  . magic mouthwash w/lidocaine SOLN Take 5 mLs by mouth 4 (four) times daily as needed for mouth pain. 480 mL 1  . methylPREDNISolone (MEDROL DOSEPAK) 4 MG TBPK tablet Take 1 tablet (4 mg total) by mouth taper from 4 doses each day to 1 dose and stop. 1 each 0  . metroNIDAZOLE (METROGEL) 0.75 % vaginal gel Place 1 Applicatorful vaginally 2 (two) times daily. 70 g 1  . omeprazole (PRILOSEC) 20 MG capsule TAKE 1 CAPSULE (20 MG TOTAL) BY MOUTH 2 (TWO) TIMES DAILY BEFORE A MEAL. 180 capsule 1  . ondansetron (ZOFRAN) 8 MG tablet Take 1 tablet (8 mg total) by mouth 2 (two) times daily as needed. Start on the third day after chemotherapy. 30 tablet 2  . prochlorperazine (COMPAZINE) 10 MG tablet Take 1 tablet (10 mg total) by mouth every 6 (six) hours as needed (Nausea or vomiting). 30 tablet 2  . sertraline (ZOLOFT) 50 MG tablet Take by mouth.    . traMADol (ULTRAM) 50 MG tablet Take 1 tablet (50 mg total) by mouth daily as needed for severe pain (headache). 30 tablet 0  . triamcinolone ointment (KENALOG) 0.5 % Apply 1 application topically 2 (two) times daily. 30 g 2  . zolpidem (AMBIEN) 10 MG tablet Take 1 tablet (10 mg total) by mouth at bedtime as needed for sleep. 30 tablet 1   No current facility-administered medications for this visit.   Facility-Administered Medications Ordered in Other Visits  Medication Dose Route Frequency Provider Last Rate Last Admin  . heparin lock flush 100 unit/mL  500 Units Intracatheter Once PRN FTruitt Merle MD      . pembrolizumab ([facility] 200 mg in sodium chloride 0.9 % 50 mL chemo infusion  200 mg Intravenous Once FTruitt Merle MD 116 mL/hr at 09/07/20 1421 200 mg at 09/07/20 1421  . sodium chloride flush (NS) 0.9 % injection 10 mL  10 mL  Intravenous PRN BAlla Feeling NP   10 mL at 03/28/20 1104  . sodium chloride flush (NS) 0.9 % injection 10 mL  10 mL Intracatheter PRN FTruitt Merle MD        PHYSICAL EXAMINATION: ECOG PERFORMANCE STATUS: 1 - Symptomatic but completely ambulatory  Vitals:   09/07/20 1319  BP: 111/79  Pulse: 89  Resp: 18  Temp: 98.1 F (36.7 C)  SpO2: 100%   Filed Weights   09/07/20 1319  Weight: 213 lb (96.6 kg)    GENERAL:alert, no distress and comfortable SKIN: skin color, texture, turgor are normal, no rashes or significant lesions EYES: normal, Conjunctiva are pink and non-injected,  sclera clear NECK: supple, thyroid normal size, non-tender, without nodularity LYMPH:  no palpable lymphadenopathy in the cervical, axillary  LUNGS: clear to auscultation and percussion with normal breathing effort HEART: regular rate & rhythm and no murmurs and no lower extremity edema ABDOMEN:abdomen soft, non-tender and normal bowel sounds Musculoskeletal:no cyanosis of digits and no clubbing  NEURO: alert & oriented x 3 with fluent speech, no focal motor/sensory deficits Breasts: Breast inspection showed them to be symmetrical with no nipple discharge. Palpation of the breasts and axilla revealed no obvious mass that I could appreciate. Skin color is normal    LABORATORY DATA:  I have reviewed the data as listed CBC Latest Ref Rng & Units 09/07/2020 08/16/2020 08/02/2020  WBC 4.0 - 10.5 K/uL 4.2 8.0 1.7(L)  Hemoglobin 12.0 - 15.0 g/dL 10.9(L) 10.0(L) 9.2(L)  Hematocrit 36.0 - 46.0 % 33.3(L) 31.4(L) 29.5(L)  Platelets 150 - 400 K/uL 237 255 290     CMP Latest Ref Rng & Units 09/07/2020 08/16/2020 08/02/2020  Glucose 70 - 99 mg/dL 91 92 101(H)  BUN 6 - 20 mg/dL 14 19 13   Creatinine 0.44 - 1.00 mg/dL 0.77 0.75 0.77  Sodium 135 - 145 mmol/L 139 138 140  Potassium 3.5 - 5.1 mmol/L 4.4 4.1 4.1  Chloride 98 - 111 mmol/L 105 105 108  CO2 22 - 32 mmol/L 25 25 25   Calcium 8.9 - 10.3 mg/dL 9.3 9.0 8.9  Total  Protein 6.5 - 8.1 g/dL 7.2 6.9 6.5  Total Bilirubin 0.3 - 1.2 mg/dL 0.2(L) 0.3 0.2(L)  Alkaline Phos 38 - 126 U/L 137(H) 152(H) 120  AST 15 - 41 U/L 23 19 26   ALT 0 - 44 U/L 24 20 40      RADIOGRAPHIC STUDIES: I have personally reviewed the radiological images as listed and agreed with the findings in the report. No results found.   ASSESSMENT & PLAN:  Athziri Freundlich is a 40 y.o. 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 Her 03/08/20 biopsy showedshe has grade III invasive ductal carcinoma of left breast metastatic to her left axillary LN. Her ER/PR/HER2 markers were all negative.CT CAP/Bone scan negative for distant mets. -Surgery is the only curative option. She has consulted with Dr. Donne Hazel about surgery and left mastectomy was offered after neoadjuvant chemo. She is interested in b/l mastectomy and reconstruction for symmetry. -To downstage disease and reduce risk of recurrence, she completed neoadjuvant chemo with ddAC q2weeksfor 4 cycles9/15/21-10/27/21. Followed by weekly carbo/taxolfor 12 weeks 05/17/20-2/9/22before proceeding with surgery. -Based onrecently publish Keynote 522 trial data,I added Keytruda q3weeks for 1 year treatment on 04/28/20.Tolerating well.  -Her left breast mass remains non-palpable since C2 AC and left axilla barely palpable s/p C3,she is clinically responding to chemotherapy. -Her 08/17/20 MRI breast showed no radiographic residual tumor or adenopathy, she had complete radiographic response.  I reviewed with patient, and she understands it does not mean her cancer cells are all gone, we have to wait for the surgical pathology -She will proceed with  surgery on 09/20/20.  -Due to her surgery, will postpone next cycle of Keytruda to 4 weeks.  I plan to see her back in 4 weeks and review her surgical pathology, to see if she needs adjuvant Xeloda if she has significant residual disease  2. Genetic Testingnegative for pathogenetic mutations with VUS of POLE gene  3. Anxiety/Depression, Social Support  -She is on Xanax 58m TID and Celexa435mand low dose Ambien for sleep. -With stress she would previously binge drink occasionally. She quit drinking 02/09/20. She does not plan to restart smoking or vaping.  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  4. Hot flashes -I discussed chemo will put her in perimenopause, so symptoms of hot flashes can happen. Last full period with start of chemo(03/2020) -Her hot flashes have significantly increasedon chemo and nearly constant impacting her sleep and daily active.  -She is on Celexa 4068murrently.She is also onGabapentin 100m60m 5.Dyspnea on exertion, tachycardia, deconditioning -secondary to chemo  -I encouraged her to exercise, and stay active. -it's improving .  7. COVID (+) on 08/02/20 with symptoms of Hoarseness and ribcage pain. She has recovered quickly and completely     PLAN: -[facility]abs [facility] and [facility], f/u and Keytruda in4 week  -surgery on 3/16   No problem-specific Assessment & Plan notes found for this encounter.   No orders of the defined types were placed in this encounter.  All questions were answered. The patient knows to call the clinic with any problems, questions or concerns. No barriers to learning was detected. The total time spent in the appointment was 30 minutes.     Margarit Minshall Truitt Merle 09/07/2020   I, AmoyJoslyn Devon acting as scribe for Keryn Nessler Truitt Merle.   I have reviewed the above documentation for accuracy and completeness,  and I agree with the above.

## 2020-09-07 ENCOUNTER — Other Ambulatory Visit: Payer: Self-pay

## 2020-09-07 ENCOUNTER — Inpatient Hospital Stay (HOSPITAL_BASED_OUTPATIENT_CLINIC_OR_DEPARTMENT_OTHER): Payer: 59 | Admitting: Hematology

## 2020-09-07 ENCOUNTER — Inpatient Hospital Stay: Payer: 59 | Attending: Hematology

## 2020-09-07 ENCOUNTER — Encounter: Payer: Self-pay | Admitting: *Deleted

## 2020-09-07 ENCOUNTER — Inpatient Hospital Stay: Payer: 59

## 2020-09-07 ENCOUNTER — Encounter: Payer: Self-pay | Admitting: Hematology

## 2020-09-07 VITALS — BP 111/79 | HR 89 | Temp 98.1°F | Resp 18 | Ht 64.0 in | Wt 213.0 lb

## 2020-09-07 DIAGNOSIS — R000 Tachycardia, unspecified: Secondary | ICD-10-CM | POA: Diagnosis not present

## 2020-09-07 DIAGNOSIS — C50112 Malignant neoplasm of central portion of left female breast: Secondary | ICD-10-CM | POA: Insufficient documentation

## 2020-09-07 DIAGNOSIS — R1900 Intra-abdominal and pelvic swelling, mass and lump, unspecified site: Secondary | ICD-10-CM | POA: Diagnosis not present

## 2020-09-07 DIAGNOSIS — Z5112 Encounter for antineoplastic immunotherapy: Secondary | ICD-10-CM | POA: Insufficient documentation

## 2020-09-07 DIAGNOSIS — C773 Secondary and unspecified malignant neoplasm of axilla and upper limb lymph nodes: Secondary | ICD-10-CM | POA: Insufficient documentation

## 2020-09-07 DIAGNOSIS — T451X5A Adverse effect of antineoplastic and immunosuppressive drugs, initial encounter: Secondary | ICD-10-CM | POA: Diagnosis not present

## 2020-09-07 DIAGNOSIS — R5383 Other fatigue: Secondary | ICD-10-CM | POA: Diagnosis not present

## 2020-09-07 DIAGNOSIS — Z79899 Other long term (current) drug therapy: Secondary | ICD-10-CM | POA: Insufficient documentation

## 2020-09-07 DIAGNOSIS — R232 Flushing: Secondary | ICD-10-CM | POA: Diagnosis not present

## 2020-09-07 DIAGNOSIS — Z95828 Presence of other vascular implants and grafts: Secondary | ICD-10-CM

## 2020-09-07 DIAGNOSIS — R0602 Shortness of breath: Secondary | ICD-10-CM | POA: Insufficient documentation

## 2020-09-07 DIAGNOSIS — Z8616 Personal history of COVID-19: Secondary | ICD-10-CM | POA: Diagnosis not present

## 2020-09-07 DIAGNOSIS — Z171 Estrogen receptor negative status [ER-]: Secondary | ICD-10-CM

## 2020-09-07 DIAGNOSIS — F418 Other specified anxiety disorders: Secondary | ICD-10-CM | POA: Insufficient documentation

## 2020-09-07 LAB — CBC WITH DIFFERENTIAL (CANCER CENTER ONLY)
Abs Immature Granulocytes: 0.01 10*3/uL (ref 0.00–0.07)
Basophils Absolute: 0 10*3/uL (ref 0.0–0.1)
Basophils Relative: 1 %
Eosinophils Absolute: 0.1 10*3/uL (ref 0.0–0.5)
Eosinophils Relative: 2 %
HCT: 33.3 % — ABNORMAL LOW (ref 36.0–46.0)
Hemoglobin: 10.9 g/dL — ABNORMAL LOW (ref 12.0–15.0)
Immature Granulocytes: 0 %
Lymphocytes Relative: 22 %
Lymphs Abs: 0.9 10*3/uL (ref 0.7–4.0)
MCH: 31.1 pg (ref 26.0–34.0)
MCHC: 32.7 g/dL (ref 30.0–36.0)
MCV: 95.1 fL (ref 80.0–100.0)
Monocytes Absolute: 0.4 10*3/uL (ref 0.1–1.0)
Monocytes Relative: 10 %
Neutro Abs: 2.7 10*3/uL (ref 1.7–7.7)
Neutrophils Relative %: 65 %
Platelet Count: 237 10*3/uL (ref 150–400)
RBC: 3.5 MIL/uL — ABNORMAL LOW (ref 3.87–5.11)
RDW: 15.9 % — ABNORMAL HIGH (ref 11.5–15.5)
WBC Count: 4.2 10*3/uL (ref 4.0–10.5)
nRBC: 0 % (ref 0.0–0.2)

## 2020-09-07 LAB — CMP (CANCER CENTER ONLY)
ALT: 24 U/L (ref 0–44)
AST: 23 U/L (ref 15–41)
Albumin: 3.7 g/dL (ref 3.5–5.0)
Alkaline Phosphatase: 137 U/L — ABNORMAL HIGH (ref 38–126)
Anion gap: 9 (ref 5–15)
BUN: 14 mg/dL (ref 6–20)
CO2: 25 mmol/L (ref 22–32)
Calcium: 9.3 mg/dL (ref 8.9–10.3)
Chloride: 105 mmol/L (ref 98–111)
Creatinine: 0.77 mg/dL (ref 0.44–1.00)
GFR, Estimated: >60 mL/min (ref >60)
Glucose, Bld: 91 mg/dL (ref 70–99)
Potassium: 4.4 mmol/L (ref 3.5–5.1)
Sodium: 139 mmol/L (ref 135–145)
Total Bilirubin: 0.2 mg/dL — ABNORMAL LOW (ref 0.3–1.2)
Total Protein: 7.2 g/dL (ref 6.5–8.1)

## 2020-09-07 MED ORDER — SODIUM CHLORIDE 0.9 % IV SOLN
200.0000 mg | Freq: Once | INTRAVENOUS | Status: AC
  Administered 2020-09-07: 200 mg via INTRAVENOUS
  Filled 2020-09-07: qty 8

## 2020-09-07 MED ORDER — SODIUM CHLORIDE 0.9% FLUSH
10.0000 mL | INTRAVENOUS | Status: DC | PRN
  Administered 2020-09-07: 10 mL
  Filled 2020-09-07: qty 10

## 2020-09-07 MED ORDER — HEPARIN SOD (PORK) LOCK FLUSH 100 UNIT/ML IV SOLN
500.0000 [IU] | Freq: Once | INTRAVENOUS | Status: AC | PRN
  Administered 2020-09-07: 500 [IU]
  Filled 2020-09-07: qty 5

## 2020-09-07 MED ORDER — SODIUM CHLORIDE 0.9 % IV SOLN
Freq: Once | INTRAVENOUS | Status: AC
  Filled 2020-09-07: qty 250

## 2020-09-07 MED ORDER — SODIUM CHLORIDE 0.9% FLUSH
10.0000 mL | Freq: Once | INTRAVENOUS | Status: AC
  Administered 2020-09-07: 10 mL
  Filled 2020-09-07: qty 10

## 2020-09-07 NOTE — Patient Instructions (Signed)
Implanted Port Insertion, Care After This sheet gives you information about how to care for yourself after your procedure. Your health care provider may also give you more specific instructions. If you have problems or questions, contact your health care provider. What can I expect after the procedure? After the procedure, it is common to have:  Discomfort at the port insertion site.  Bruising on the skin over the port. This should improve over 3-4 days. Follow these instructions at home: Port care  After your port is placed, you will get a manufacturer's information card. The card has information about your port. Keep this card with you at all times.  Take care of the port as told by your health care provider. Ask your health care provider if you or a family member can get training for taking care of the port at home. A home health care nurse may also take care of the port.  Make sure to remember what type of port you have. Incision care  Follow instructions from your health care provider about how to take care of your port insertion site. Make sure you: ? Wash your hands with soap and water before and after you change your bandage (dressing). If soap and water are not available, use hand sanitizer. ? Change your dressing as told by your health care provider. ? Leave stitches (sutures), skin glue, or adhesive strips in place. These skin closures may need to stay in place for 2 weeks or longer. If adhesive strip edges start to loosen and curl up, you may trim the loose edges. Do not remove adhesive strips completely unless your health care provider tells you to do that.  Check your port insertion site every day for signs of infection. Check for: ? Redness, swelling, or pain. ? Fluid or blood. ? Warmth. ? Pus or a bad smell.      Activity  Return to your normal activities as told by your health care provider. Ask your health care provider what activities are safe for you.  Do not  lift anything that is heavier than 10 lb (4.5 kg), or the limit that you are told, until your health care provider says that it is safe. General instructions  Take over-the-counter and prescription medicines only as told by your health care provider.  Do not take baths, swim, or use a hot tub until your health care provider approves. Ask your health care provider if you may take showers. You may only be allowed to take sponge baths.  Do not drive for 24 hours if you were given a sedative during your procedure.  Wear a medical alert bracelet in case of an emergency. This will tell any health care providers that you have a port.  Keep all follow-up visits as told by your health care provider. This is important. Contact a health care provider if:  You cannot flush your port with saline as directed, or you cannot draw blood from the port.  You have a fever or chills.  You have redness, swelling, or pain around your port insertion site.  You have fluid or blood coming from your port insertion site.  Your port insertion site feels warm to the touch.  You have pus or a bad smell coming from the port insertion site. Get help right away if:  You have chest pain or shortness of breath.  You have bleeding from your port that you cannot control. Summary  Take care of the port as told by your   health care provider. Keep the manufacturer's information card with you at all times.  Change your dressing as told by your health care provider.  Contact a health care provider if you have a fever or chills or if you have redness, swelling, or pain around your port insertion site.  Keep all follow-up visits as told by your health care provider. This information is not intended to replace advice given to you by your health care provider. Make sure you discuss any questions you have with your health care provider. Document Revised: 01/20/2018 Document Reviewed: 01/20/2018 Elsevier Patient Education   2021 Elsevier Inc.  

## 2020-09-07 NOTE — Patient Instructions (Signed)
[facility] °Discharge Instructions for Patients Receiving Chemotherapy ° °Today you received the following immunotherapy agent: Pembrolizumab (Keytruda) ° °To help prevent nausea and vomiting after your treatment, we encourage you to take your nausea medication as directed by your MD. °  °If you develop nausea and vomiting that is not controlled by your nausea medication, call the clinic.  ° °BELOW ARE SYMPTOMS THAT SHOULD BE REPORTED IMMEDIATELY: °· *FEVER GREATER THAN 100.5 F °· *CHILLS WITH OR WITHOUT FEVER °· NAUSEA AND VOMITING THAT IS NOT CONTROLLED WITH YOUR NAUSEA MEDICATION °· *UNUSUAL SHORTNESS OF BREATH °· *UNUSUAL BRUISING OR BLEEDING °· TENDERNESS IN MOUTH AND THROAT WITH OR WITHOUT PRESENCE OF ULCERS °· *URINARY PROBLEMS °· *BOWEL PROBLEMS °· UNUSUAL RASH °Items with * indicate a potential emergency and should be followed up as soon as possible. ° °Feel free to call the clinic should you have any questions or concerns. The clinic phone number is [phone]. ° °Please show the CHEMO ALERT CARD at check-in to the Emergency Department and triage nurse. ° ° °

## 2020-09-08 ENCOUNTER — Telehealth: Payer: Self-pay | Admitting: Hematology

## 2020-09-08 NOTE — Telephone Encounter (Signed)
Scheduled follow-up appointments per 3/3 los. Patient is aware. 

## 2020-09-09 ENCOUNTER — Encounter: Payer: Self-pay | Admitting: Hematology

## 2020-09-13 ENCOUNTER — Other Ambulatory Visit: Payer: Self-pay | Admitting: General Surgery

## 2020-09-19 ENCOUNTER — Other Ambulatory Visit: Payer: Self-pay

## 2020-09-19 ENCOUNTER — Encounter (HOSPITAL_COMMUNITY): Payer: Self-pay | Admitting: General Surgery

## 2020-09-19 NOTE — Progress Notes (Signed)
Caitlyn Hendricks was positive 08/02/20, she will not need to be tested for Covid for this surgery.

## 2020-09-19 NOTE — Anesthesia Preprocedure Evaluation (Addendum)
Anesthesia Evaluation  Patient identified by MRN, date of birth, ID band Patient awake    Reviewed: Allergy & Precautions, NPO status , Patient's Chart, lab work & pertinent test results  Airway Mallampati: II  TM Distance: >3 FB Neck ROM: Full    Dental no notable dental hx. (+) Teeth Intact, Dental Advisory Given   Pulmonary neg pulmonary ROS,    Pulmonary exam normal breath sounds clear to auscultation       Cardiovascular Exercise Tolerance: Good negative cardio ROS Normal cardiovascular exam Rhythm:Regular Rate:Normal     Neuro/Psych  Headaches, PSYCHIATRIC DISORDERS Anxiety Depression negative neurological ROS  negative psych ROS   GI/Hepatic Neg liver ROS, GERD  ,  Endo/Other  negative endocrine ROS  Renal/GU negative Renal ROS  negative genitourinary   Musculoskeletal negative musculoskeletal ROS (+) Arthritis ,   Abdominal   Peds negative pediatric ROS (+)  Hematology negative hematology ROS (+) anemia ,   Anesthesia Other Findings Breast cancer  Reproductive/Obstetrics negative OB ROS                           Anesthesia Physical Anesthesia Plan  ASA: III  Anesthesia Plan: General and Regional   Post-op Pain Management: GA combined w/ Regional for post-op pain   Induction: Intravenous  PONV Risk Score and Plan: 3 and Scopolamine patch - Pre-op, Ondansetron and Dexamethasone  Airway Management Planned: Oral ETT  Additional Equipment: None  Intra-op Plan:   Post-operative Plan: Extubation in OR  Informed Consent: I have reviewed the patients History and Physical, chart, labs and discussed the procedure including the risks, benefits and alternatives for the proposed anesthesia with the patient or authorized representative who has indicated his/her understanding and acceptance.     Dental advisory given  Plan Discussed with: CRNA, Anesthesiologist and  Surgeon  Anesthesia Plan Comments: (PEC block for postop pain control. GETA.)       Anesthesia Quick Evaluation

## 2020-09-20 ENCOUNTER — Encounter (HOSPITAL_COMMUNITY)
Admission: RE | Disposition: A | Discharge status: Home / Self Care | Payer: Self-pay | Source: Home / Self Care | Attending: General Surgery

## 2020-09-20 ENCOUNTER — Other Ambulatory Visit: Payer: Self-pay

## 2020-09-20 ENCOUNTER — Encounter (HOSPITAL_COMMUNITY): Payer: Self-pay | Admitting: General Surgery

## 2020-09-20 ENCOUNTER — Ambulatory Visit (HOSPITAL_COMMUNITY): Payer: 59 | Admitting: Anesthesiology

## 2020-09-20 ENCOUNTER — Observation Stay (HOSPITAL_COMMUNITY)
Admission: RE | Admit: 2020-09-20 | Discharge: 2020-09-21 | Disposition: A | Discharge status: Home / Self Care | Payer: 59 | Attending: General Surgery | Admitting: General Surgery

## 2020-09-20 DIAGNOSIS — C773 Secondary and unspecified malignant neoplasm of axilla and upper limb lymph nodes: Secondary | ICD-10-CM | POA: Insufficient documentation

## 2020-09-20 DIAGNOSIS — Z9012 Acquired absence of left breast and nipple: Secondary | ICD-10-CM

## 2020-09-20 DIAGNOSIS — Z87891 Personal history of nicotine dependence: Secondary | ICD-10-CM | POA: Diagnosis not present

## 2020-09-20 DIAGNOSIS — C50912 Malignant neoplasm of unspecified site of left female breast: Principal | ICD-10-CM | POA: Insufficient documentation

## 2020-09-20 HISTORY — DX: Unspecified osteoarthritis, unspecified site: M19.90

## 2020-09-20 HISTORY — DX: Malignant (primary) neoplasm, unspecified: C80.1

## 2020-09-20 HISTORY — DX: Panic disorder (episodic paroxysmal anxiety): F41.0

## 2020-09-20 HISTORY — DX: Anemia, unspecified: D64.9

## 2020-09-20 HISTORY — DX: Headache, unspecified: R51.9

## 2020-09-20 HISTORY — PX: MASTECTOMY MODIFIED RADICAL: SHX5962

## 2020-09-20 HISTORY — DX: Gastro-esophageal reflux disease without esophagitis: K21.9

## 2020-09-20 LAB — POCT PREGNANCY, URINE: Preg Test, Ur: NEGATIVE

## 2020-09-20 SURGERY — MASTECTOMY, MODIFIED RADICAL
Anesthesia: Regional | Site: Breast | Laterality: Left

## 2020-09-20 MED ORDER — ALPRAZOLAM 0.5 MG PO TABS
1.0000 mg | ORAL_TABLET | Freq: Three times a day (TID) | ORAL | Status: DC | PRN
  Administered 2020-09-21: 1 mg via ORAL
  Filled 2020-09-20: qty 2

## 2020-09-20 MED ORDER — PROPOFOL 10 MG/ML IV BOLUS
INTRAVENOUS | Status: AC
  Filled 2020-09-20: qty 20

## 2020-09-20 MED ORDER — OXYCODONE HCL 5 MG/5ML PO SOLN: 5.0000 mg | Freq: Once | ORAL | Status: AC | PRN

## 2020-09-20 MED ORDER — METHOCARBAMOL 500 MG PO TABS
ORAL_TABLET | ORAL | Status: AC
  Filled 2020-09-20: qty 1

## 2020-09-20 MED ORDER — DIPHENHYDRAMINE HCL 50 MG/ML IJ SOLN
INTRAMUSCULAR | Status: DC | PRN
  Administered 2020-09-20: 12.5 mg via INTRAVENOUS

## 2020-09-20 MED ORDER — DIPHENHYDRAMINE HCL 50 MG/ML IJ SOLN: 12.5000 mg | Freq: Once | INTRAMUSCULAR | Status: DC | PRN

## 2020-09-20 MED ORDER — METHOCARBAMOL 500 MG PO TABS
500.0000 mg | ORAL_TABLET | Freq: Four times a day (QID) | ORAL | Status: DC | PRN
  Administered 2020-09-20 – 2020-09-21 (×3): 500 mg via ORAL
  Filled 2020-09-20 (×2): qty 1

## 2020-09-20 MED ORDER — CHLORHEXIDINE GLUCONATE CLOTH 2 % EX PADS: 6.0000 | MEDICATED_PAD | Freq: Once | CUTANEOUS | Status: DC

## 2020-09-20 MED ORDER — SUGAMMADEX SODIUM 200 MG/2ML IV SOLN
INTRAVENOUS | Status: DC | PRN
  Administered 2020-09-20: 200 mg via INTRAVENOUS

## 2020-09-20 MED ORDER — MIDAZOLAM HCL 5 MG/5ML IJ SOLN
INTRAMUSCULAR | Status: DC | PRN
  Administered 2020-09-20: 2 mg via INTRAVENOUS

## 2020-09-20 MED ORDER — FENTANYL CITRATE (PF) 250 MCG/5ML IJ SOLN
INTRAMUSCULAR | Status: AC
  Filled 2020-09-20: qty 5

## 2020-09-20 MED ORDER — DIPHENHYDRAMINE HCL 50 MG/ML IJ SOLN
INTRAMUSCULAR | Status: AC
  Filled 2020-09-20: qty 1

## 2020-09-20 MED ORDER — ONDANSETRON HCL 4 MG/2ML IJ SOLN
INTRAMUSCULAR | Status: AC
  Filled 2020-09-20: qty 2

## 2020-09-20 MED ORDER — BACLOFEN 10 MG PO TABS: 10.0000 mg | ORAL_TABLET | Freq: Two times a day (BID) | ORAL | Status: DC | PRN

## 2020-09-20 MED ORDER — CHLORHEXIDINE GLUCONATE 0.12 % MT SOLN
15.0000 mL | Freq: Once | OROMUCOSAL | Status: AC
  Administered 2020-09-20: 15 mL via OROMUCOSAL
  Filled 2020-09-20: qty 15

## 2020-09-20 MED ORDER — BUPIVACAINE LIPOSOME 1.3 % IJ SUSP
INTRAMUSCULAR | Status: DC | PRN
  Administered 2020-09-20: 10 mL

## 2020-09-20 MED ORDER — DIPHENHYDRAMINE HCL 25 MG PO CAPS
25.0000 mg | ORAL_CAPSULE | Freq: Four times a day (QID) | ORAL | Status: DC | PRN
  Administered 2020-09-20 – 2020-09-21 (×2): 25 mg via ORAL
  Filled 2020-09-20 (×2): qty 1

## 2020-09-20 MED ORDER — PANTOPRAZOLE SODIUM 40 MG PO TBEC
40.0000 mg | DELAYED_RELEASE_TABLET | Freq: Every day | ORAL | Status: DC
  Administered 2020-09-20 – 2020-09-21 (×2): 40 mg via ORAL
  Filled 2020-09-20 (×2): qty 1

## 2020-09-20 MED ORDER — GABAPENTIN 100 MG PO CAPS: 100.0000 mg | ORAL_CAPSULE | Freq: Three times a day (TID) | ORAL | Status: DC | PRN

## 2020-09-20 MED ORDER — PHENYLEPHRINE 40 MCG/ML (10ML) SYRINGE FOR IV PUSH (FOR BLOOD PRESSURE SUPPORT)
PREFILLED_SYRINGE | INTRAVENOUS | Status: DC | PRN
  Administered 2020-09-20: 120 ug via INTRAVENOUS
  Administered 2020-09-20 (×2): 80 ug via INTRAVENOUS
  Administered 2020-09-20: 120 ug via INTRAVENOUS

## 2020-09-20 MED ORDER — CEFAZOLIN SODIUM-DEXTROSE 2-4 GM/100ML-% IV SOLN
2.0000 g | INTRAVENOUS | Status: AC
  Administered 2020-09-20: 2 g via INTRAVENOUS
  Filled 2020-09-20: qty 100

## 2020-09-20 MED ORDER — ENSURE PRE-SURGERY PO LIQD: 296.0000 mL | Freq: Once | ORAL | Status: DC

## 2020-09-20 MED ORDER — LACTATED RINGERS IV SOLN: INTRAVENOUS | Status: DC

## 2020-09-20 MED ORDER — ONDANSETRON HCL 4 MG/2ML IJ SOLN
INTRAMUSCULAR | Status: DC | PRN
  Administered 2020-09-20: 4 mg via INTRAVENOUS

## 2020-09-20 MED ORDER — LIDOCAINE 2% (20 MG/ML) 5 ML SYRINGE
INTRAMUSCULAR | Status: DC | PRN
  Administered 2020-09-20: 40 mg via INTRAVENOUS

## 2020-09-20 MED ORDER — AMISULPRIDE (ANTIEMETIC) 5 MG/2ML IV SOLN: 10.0000 mg | Freq: Once | INTRAVENOUS | Status: DC | PRN

## 2020-09-20 MED ORDER — ACETAMINOPHEN 500 MG PO TABS
1000.0000 mg | ORAL_TABLET | Freq: Four times a day (QID) | ORAL | Status: DC
  Administered 2020-09-20 – 2020-09-21 (×3): 1000 mg via ORAL
  Filled 2020-09-20 (×4): qty 2

## 2020-09-20 MED ORDER — OXYCODONE HCL 5 MG PO TABS
5.0000 mg | ORAL_TABLET | ORAL | Status: DC | PRN
Start: 2020-09-20 — End: 2020-09-21
  Administered 2020-09-20: 10 mg via ORAL
  Filled 2020-09-20: qty 2

## 2020-09-20 MED ORDER — PROMETHAZINE HCL 25 MG/ML IJ SOLN
6.2500 mg | INTRAMUSCULAR | Status: DC | PRN
Start: 2020-09-20 — End: 2020-09-20

## 2020-09-20 MED ORDER — OXYCODONE HCL 5 MG PO TABS
5.0000 mg | ORAL_TABLET | Freq: Once | ORAL | Status: AC | PRN
  Administered 2020-09-20: 5 mg via ORAL

## 2020-09-20 MED ORDER — SIMETHICONE 80 MG PO CHEW
40.0000 mg | CHEWABLE_TABLET | Freq: Four times a day (QID) | ORAL | Status: DC | PRN
Start: 2020-09-20 — End: 2020-09-21

## 2020-09-20 MED ORDER — ROCURONIUM BROMIDE 10 MG/ML (PF) SYRINGE
PREFILLED_SYRINGE | INTRAVENOUS | Status: AC
  Filled 2020-09-20: qty 10

## 2020-09-20 MED ORDER — KETAMINE HCL 10 MG/ML IJ SOLN
INTRAMUSCULAR | Status: DC | PRN
  Administered 2020-09-20: 30 mg via INTRAVENOUS

## 2020-09-20 MED ORDER — CITALOPRAM HYDROBROMIDE 20 MG PO TABS
20.0000 mg | ORAL_TABLET | Freq: Every day | ORAL | Status: DC
  Administered 2020-09-20 – 2020-09-21 (×2): 20 mg via ORAL
  Filled 2020-09-20 (×2): qty 1

## 2020-09-20 MED ORDER — FENTANYL CITRATE (PF) 100 MCG/2ML IJ SOLN
INTRAMUSCULAR | Status: AC
  Filled 2020-09-20: qty 2

## 2020-09-20 MED ORDER — OXYCODONE HCL 5 MG PO TABS
ORAL_TABLET | ORAL | Status: AC
  Filled 2020-09-20: qty 1

## 2020-09-20 MED ORDER — SCOPOLAMINE 1 MG/3DAYS TD PT72
1.0000 | MEDICATED_PATCH | TRANSDERMAL | Status: DC
  Administered 2020-09-20: 1.5 mg via TRANSDERMAL
  Filled 2020-09-20: qty 1

## 2020-09-20 MED ORDER — PHENYLEPHRINE HCL-NACL 10-0.9 MG/250ML-% IV SOLN
INTRAVENOUS | Status: DC | PRN
  Administered 2020-09-20: 40 ug/min via INTRAVENOUS

## 2020-09-20 MED ORDER — MORPHINE SULFATE (PF) 2 MG/ML IV SOLN
1.0000 mg | INTRAVENOUS | Status: DC | PRN
Start: 2020-09-20 — End: 2020-09-21
  Administered 2020-09-20 – 2020-09-21 (×3): 1 mg via INTRAVENOUS
  Filled 2020-09-20 (×3): qty 1

## 2020-09-20 MED ORDER — FENTANYL CITRATE (PF) 100 MCG/2ML IJ SOLN
25.0000 ug | INTRAMUSCULAR | Status: DC | PRN
Start: 2020-09-20 — End: 2020-09-20
  Administered 2020-09-20 (×3): 50 ug via INTRAVENOUS

## 2020-09-20 MED ORDER — ONDANSETRON 4 MG PO TBDP: 4.0000 mg | ORAL_TABLET | Freq: Four times a day (QID) | ORAL | Status: DC | PRN

## 2020-09-20 MED ORDER — ORAL CARE MOUTH RINSE: 15.0000 mL | Freq: Once | OROMUCOSAL | Status: AC

## 2020-09-20 MED ORDER — FENTANYL CITRATE (PF) 100 MCG/2ML IJ SOLN
INTRAMUSCULAR | Status: DC | PRN
  Administered 2020-09-20 (×2): 100 ug via INTRAVENOUS
  Administered 2020-09-20 (×3): 50 ug via INTRAVENOUS

## 2020-09-20 MED ORDER — SODIUM CHLORIDE 0.9 % IV SOLN: INTRAVENOUS | Status: DC

## 2020-09-20 MED ORDER — BUPIVACAINE HCL (PF) 0.5 % IJ SOLN
INTRAMUSCULAR | Status: DC | PRN
  Administered 2020-09-20: 20 mL

## 2020-09-20 MED ORDER — DEXAMETHASONE SODIUM PHOSPHATE 10 MG/ML IJ SOLN
INTRAMUSCULAR | Status: DC | PRN
  Administered 2020-09-20: 10 mg via INTRAVENOUS

## 2020-09-20 MED ORDER — PROPOFOL 10 MG/ML IV BOLUS
INTRAVENOUS | Status: DC | PRN
  Administered 2020-09-20: 150 mg via INTRAVENOUS

## 2020-09-20 MED ORDER — IBUPROFEN 800 MG PO TABS
800.0000 mg | ORAL_TABLET | Freq: Three times a day (TID) | ORAL | Status: DC | PRN
  Administered 2020-09-21: 800 mg via ORAL
  Filled 2020-09-20: qty 1

## 2020-09-20 MED ORDER — DEXAMETHASONE SODIUM PHOSPHATE 10 MG/ML IJ SOLN
INTRAMUSCULAR | Status: AC
  Filled 2020-09-20: qty 1

## 2020-09-20 MED ORDER — LIDOCAINE 2% (20 MG/ML) 5 ML SYRINGE
INTRAMUSCULAR | Status: AC
  Filled 2020-09-20: qty 5

## 2020-09-20 MED ORDER — ROCURONIUM BROMIDE 10 MG/ML (PF) SYRINGE
PREFILLED_SYRINGE | INTRAVENOUS | Status: DC | PRN
  Administered 2020-09-20: 60 mg via INTRAVENOUS

## 2020-09-20 MED ORDER — MIDAZOLAM HCL 2 MG/2ML IJ SOLN
INTRAMUSCULAR | Status: AC
  Filled 2020-09-20: qty 2

## 2020-09-20 MED ORDER — KETOROLAC TROMETHAMINE 15 MG/ML IJ SOLN
15.0000 mg | INTRAMUSCULAR | Status: AC
  Administered 2020-09-20: 15 mg via INTRAVENOUS
  Filled 2020-09-20: qty 1

## 2020-09-20 MED ORDER — KETAMINE HCL 50 MG/5ML IJ SOSY
PREFILLED_SYRINGE | INTRAMUSCULAR | Status: AC
  Filled 2020-09-20: qty 5

## 2020-09-20 MED ORDER — ONDANSETRON HCL 4 MG/2ML IJ SOLN
4.0000 mg | Freq: Four times a day (QID) | INTRAMUSCULAR | Status: DC | PRN
  Administered 2020-09-21: 4 mg via INTRAVENOUS
  Filled 2020-09-20: qty 2

## 2020-09-20 MED ORDER — ACETAMINOPHEN 500 MG PO TABS
1000.0000 mg | ORAL_TABLET | Freq: Once | ORAL | Status: AC
  Administered 2020-09-20: 1000 mg via ORAL
  Filled 2020-09-20: qty 2

## 2020-09-20 MED ORDER — FLUTICASONE PROPIONATE 50 MCG/ACT NA SUSP
2.0000 | Freq: Every day | NASAL | Status: DC | PRN
  Filled 2020-09-20: qty 16

## 2020-09-20 SURGICAL SUPPLY — 48 items
APPLIER CLIP 9.375 MED OPEN (MISCELLANEOUS) ×8
BINDER BREAST XLRG (GAUZE/BANDAGES/DRESSINGS) ×2 IMPLANT
BIOPATCH RED 1 DISK 7.0 (GAUZE/BANDAGES/DRESSINGS) ×2 IMPLANT
CANISTER SUCT 3000ML PPV (MISCELLANEOUS) ×2 IMPLANT
CHLORAPREP W/TINT 26 (MISCELLANEOUS) ×2 IMPLANT
CLIP APPLIE 9.375 MED OPEN (MISCELLANEOUS) ×4 IMPLANT
COVER SURGICAL LIGHT HANDLE (MISCELLANEOUS) ×2 IMPLANT
COVER WAND RF STERILE (DRAPES) ×2 IMPLANT
DERMABOND ADHESIVE PROPEN (GAUZE/BANDAGES/DRESSINGS) ×1
DERMABOND ADVANCED (GAUZE/BANDAGES/DRESSINGS) ×1
DERMABOND ADVANCED .7 DNX12 (GAUZE/BANDAGES/DRESSINGS) ×1 IMPLANT
DERMABOND ADVANCED .7 DNX6 (GAUZE/BANDAGES/DRESSINGS) ×1 IMPLANT
DRAIN CHANNEL 19F RND (DRAIN) ×4 IMPLANT
DRAPE LAPAROSCOPIC ABDOMINAL (DRAPES) ×2 IMPLANT
DRSG TEGADERM 4X4.5 CHG (GAUZE/BANDAGES/DRESSINGS) ×2 IMPLANT
DRSG TEGADERM 4X4.75 (GAUZE/BANDAGES/DRESSINGS) ×2 IMPLANT
ELECT REM PT RETURN 9FT ADLT (ELECTROSURGICAL) ×2
ELECTRODE REM PT RTRN 9FT ADLT (ELECTROSURGICAL) ×1 IMPLANT
EVACUATOR SILICONE 100CC (DRAIN) ×4 IMPLANT
GAUZE SPONGE 4X4 12PLY STRL (GAUZE/BANDAGES/DRESSINGS) ×2 IMPLANT
GLOVE BIO SURGEON STRL SZ7 (GLOVE) ×2 IMPLANT
GLOVE BIOGEL PI IND STRL 7.5 (GLOVE) ×1 IMPLANT
GLOVE BIOGEL PI INDICATOR 7.5 (GLOVE) ×1
GOWN STRL REUS W/ TWL LRG LVL3 (GOWN DISPOSABLE) ×3 IMPLANT
GOWN STRL REUS W/TWL LRG LVL3 (GOWN DISPOSABLE) ×3
HEMOSTAT ARISTA ABSORB 3G PWDR (HEMOSTASIS) ×6 IMPLANT
HEMOSTAT SNOW SURGICEL 2X4 (HEMOSTASIS) ×4 IMPLANT
KIT BASIN OR (CUSTOM PROCEDURE TRAY) ×2 IMPLANT
KIT TURNOVER KIT B (KITS) ×2 IMPLANT
MARKER SKIN DUAL TIP RULER LAB (MISCELLANEOUS) ×2 IMPLANT
NS IRRIG 1000ML POUR BTL (IV SOLUTION) ×2 IMPLANT
PACK GENERAL/GYN (CUSTOM PROCEDURE TRAY) ×2 IMPLANT
PAD ARMBOARD 7.5X6 YLW CONV (MISCELLANEOUS) ×2 IMPLANT
PENCIL SMOKE EVACUATOR (MISCELLANEOUS) ×2 IMPLANT
RETRACTOR ONETRAX LX 90X20 (MISCELLANEOUS) ×2 IMPLANT
SPONGE LAP 18X18 RF (DISPOSABLE) ×2 IMPLANT
STRIP CLOSURE SKIN 1/2X4 (GAUZE/BANDAGES/DRESSINGS) ×2 IMPLANT
SUT ETHILON 2 0 FS 18 (SUTURE) ×4 IMPLANT
SUT MON AB 4-0 PC3 18 (SUTURE) ×2 IMPLANT
SUT SILK 3 0 SH 30 (SUTURE) ×2 IMPLANT
SUT VIC AB 2-0 SH 18 (SUTURE) ×2 IMPLANT
SUT VIC AB 3-0 54X BRD REEL (SUTURE) ×2 IMPLANT
SUT VIC AB 3-0 BRD 54 (SUTURE) ×2
SUT VIC AB 3-0 SH 27 (SUTURE) ×1
SUT VIC AB 3-0 SH 27XBRD (SUTURE) ×1 IMPLANT
SUT VIC AB 3-0 SH 8-18 (SUTURE) ×2 IMPLANT
TOWEL GREEN STERILE (TOWEL DISPOSABLE) ×2 IMPLANT
TOWEL GREEN STERILE FF (TOWEL DISPOSABLE) ×2 IMPLANT

## 2020-09-20 NOTE — Anesthesia Procedure Notes (Signed)
Anesthesia Regional Block: Pectoralis block   Pre-Anesthetic Checklist: ,, timeout performed, Correct Patient, Correct Site, Correct Laterality, Correct Procedure, Correct Position, site marked, Risks and benefits discussed,  Surgical consent,  Pre-op evaluation,  At surgeon's request and post-op pain management  Laterality: Left  Prep: chloraprep       Needles:  Injection technique: Single-shot  Needle Type: Echogenic Stimulator Needle     Needle Length: 10cm      Additional Needles:   Narrative:  Start time: 09/20/2020 7:50 AM End time: 09/20/2020 7:55 AM Injection made incrementally with aspirations every 5 mL.  Performed by: Personally  Anesthesiologist: Merlinda Frederick, MD  Additional Notes: A functioning IV was confirmed and monitors were applied.  Sterile prep and drape, hand hygiene and sterile gloves were used.  Negative aspiration and test dose prior to incremental administration of local anesthetic. The patient tolerated the procedure well.Ultrasound  guidance: relevant anatomy identified, needle position confirmed, local anesthetic spread visualized around nerve(s), vascular puncture avoided.  Image printed for medical record.

## 2020-09-20 NOTE — Anesthesia Procedure Notes (Signed)
Procedure Name: Intubation Date/Time: 09/20/2020 8:37 AM Performed by: Jenne Campus, CRNA Pre-anesthesia Checklist: Patient identified, Emergency Drugs available, Suction available and Patient being monitored Patient Re-evaluated:Patient Re-evaluated prior to induction Oxygen Delivery Method: Circle System Utilized Preoxygenation: Pre-oxygenation with 100% oxygen Induction Type: IV induction Ventilation: Mask ventilation without difficulty Laryngoscope Size: Miller and 2 Grade View: Grade I Tube type: Oral Tube size: 7.0 mm Number of attempts: 1 Airway Equipment and Method: Stylet Placement Confirmation: ETT inserted through vocal cords under direct vision,  positive ETCO2 and breath sounds checked- equal and bilateral Secured at: 22 cm Tube secured with: Tape Dental Injury: Teeth and Oropharynx as per pre-operative assessment

## 2020-09-20 NOTE — Progress Notes (Signed)
Patient received to room [phone] s/p right modified radical mastectomy.  Pt A & O.  Parents at bedside.  States pain at this time is minimal.  Introduced to staff and to unit routine.  She is independent with activities after observation of steady gait.  Bed locked and in low position.  Call light within reach.  Dsg dry and intact.  Chest binder in place.

## 2020-09-20 NOTE — Interval H&P Note (Signed)
History and Physical Interval Note:  09/20/2020 8:07 AM  Caitlyn Hendricks  has presented today for surgery, with the diagnosis of INFLAMMATORY BREAST CANCER.  The various methods of treatment have been discussed with the patient and family. After consideration of risks, benefits and other options for treatment, the patient has consented to  Procedure(s) with comments: LEFT MODIFIED RADICAL MASTECTOMY (Left) - RNFA; [place]; as a surgical intervention.  The patient's history has been reviewed, patient examined, no change in status, stable for surgery.  I have reviewed the patient's chart and labs.  Questions were answered to the patient's satisfaction.     [name]

## 2020-09-20 NOTE — Anesthesia Postprocedure Evaluation (Signed)
Anesthesia Post Note  Patient: Kween Bacorn  Procedure(s) Performed: LEFT MODIFIED RADICAL MASTECTOMY (Left Breast)     Patient location during evaluation: PACU Anesthesia Type: Regional and General Level of consciousness: awake and alert Pain management: pain level controlled Vital Signs Assessment: post-procedure vital signs reviewed and stable Respiratory status: spontaneous breathing, nonlabored ventilation and respiratory function stable Cardiovascular status: blood pressure returned to baseline and stable Postop Assessment: no apparent nausea or vomiting Anesthetic complications: no   No complications documented.  Last Vitals:  Vitals:   09/20/20 1330 09/20/20 1400  BP: 135/86 140/69  Pulse: 86 94  Resp: 14 17  Temp:    SpO2: 99% 99%    Last Pain:  Vitals:   09/20/20 1400  TempSrc:   PainSc: 3                  Candra R Ashawn Rinehart

## 2020-09-20 NOTE — Transfer of Care (Signed)
Immediate Anesthesia Transfer of Care Note  Patient: Caitlyn Hendricks  Procedure(s) Performed: LEFT MODIFIED RADICAL MASTECTOMY (Left Breast)  Patient Location: PACU  Anesthesia Type:General  Level of Consciousness: oriented, drowsy and patient cooperative  Airway & Oxygen Therapy: Patient Spontanous Breathing and Patient connected to nasal cannula oxygen  Post-op Assessment: Report given to RN and Post -op Vital signs reviewed and stable  Post vital signs: Reviewed  Last Vitals:  Vitals Value Taken Time  BP 127/73 09/20/20 1042  Temp    Pulse 95 09/20/20 1046  Resp 12 09/20/20 1046  SpO2 97 % 09/20/20 1046  Vitals shown include unvalidated device data.  Last Pain:  Vitals:   09/20/20 0709  TempSrc: Oral         Complications: No complications documented.

## 2020-09-20 NOTE — H&P (Signed)
  26 yof who I saw previously with several months of left breast pain and rapid enlargement. this breast is larger and had to wear larger bra if she can tolerate it. it has been warm and heavy. tender to touch. it has become red in past couple weeks also. no dc. she also noted mass during last few weeks as well. she has fh in an aunt. she had mm with c density breasts. there is 2x1x2.6 cm mass at 12 oclock and skin thickening. US shows two adjacent irregular masses measuring 2.1x0.9x1.1 cm and other is 1.1x1.4x0.6 cm that total span is 3.6 cm. there are four enlarged subpectoral nodes and at least five enlarged ax nodes. 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 she is a Air traffic controller. Clinically I thought she had IBC when I met her. I placed a port and then she began chemotherapy. she does not have stage IV disease. she stated her breast returned really to normal after 2 cycles. she hasnt had many other issues with chemotherapy. she finished the 9th of February. repeat MRI shows complete radiologic resolution. there is no adenopathy and no breast abnormalities either. her genetic testing was also negative (had VUS in [place]). she is here with her mom to discuss surgery timing  Past Surgical History  Breast Biopsy  Left.  Allergies No Known Drug Allergies   Medication History ALPRAZolam (1MG  Tablet, Oral) Active. Citalopram Hydrobromide (20MG  Tablet, Oral) Active.  Social History  Alcohol use  Recently quit alcohol use. Caffeine use  Carbonated beverages, Coffee, Tea. Illicit drug use  Recently quit drug use. Tobacco use  Former smoker.  Family History  Arthritis  Mother. Breast Cancer  Family Members In General. Cerebrovascular Accident  Father. Prostate Cancer  Father. Respiratory Condition  Mother.    Physical Exam General Mental Status-Alert. Orientation-Oriented X3. Breast Nipples-No Discharge. Breast  Lump-No Palpable Breast Mass. Lymphatic Head & Neck General Head & Neck Lymphatics: Bilateral - Description - Normal. Axillary General Axillary Region: Bilateral - Description - Normal. Note: no [place] adenopathy   Assessment & Plan  BREAST CANCER METASTASIZED TO AXILLARY LYMPH NODE, LEFT (C50.912) Story: Left MRM she clinically had inflammatory breast cancer at outset with rapid growth, nodal disease and redness. She has had great response but I still think at her age with this dx needs a left MRM. I discussed that again with her today as well as the good news about the complete radiologic response. I dont think she should have reconstruction at the same time either. we discussed surgery, drains, radiotherapy postop, alnd with risk of lymphedema/neuropathy/decreased rom and need for PT postoperatively. she is interested in bilaterals but we decided to do that at later date and just treat the cancer side to get her to radiotherapy.

## 2020-09-20 NOTE — Op Note (Signed)
Preoperative diagnosis: Left inflammatory breast cancer status post primary chemotherapy Postoperative diagnosis: Same as above Procedure: Left modified radical mastectomy Surgeon: Dr. Serita Grammes Anesthesia: General with a pectoral block Estimated blood loss: 100 cc Drains: 2 19 French Blake drains at completion Specimens: 1.  Left breast and axillary contents marked short superior, long marks axillary contents 2.  Additional axillary contents Complications: None Sponge and count was correct at completion Disposition to recovery stable condition  Indications:39 yof who I saw previously with several months of left breast pain and rapid enlargement. . she had mm with c density breasts. there is 2x1x2.6 cm mass at 12 oclock and skin thickening. US shows two adjacent irregular masses measuring 2.1x0.9x1.1 cm and other is 1.1x1.4x0.6 cm that total span is 3.6 cm. there are four enlarged subpectoral nodes and at least five enlarged ax nodes. 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  Clinically I thought she had IBC when I met her. I placed a port and then she began chemotherapy. she does not have stage IV disease. repeat MRI shows complete radiologic resolution. there is no adenopathy and no breast abnormalities either. We discussed proceeding with a modified radical mastectomy due to the inflammatory breast cancer.  Procedure: After informed consent was obtained the patient first underwent a pectoral block with anesthesia.  She was given antibiotics.  SCDs were placed.  She was placed under general anesthesia without complication.  She was prepped and draped in the standard sterile surgical fashion.  A surgical timeout was then performed.  I made a large elliptical incision to encompass the nipple areolar complex.  I then created flaps to the clavicle superiorly, parasternal area medially, inframammary fold inferiorly and the latissimus laterally.   I then remove the breast from the pectoralis muscle to include the fascia.  I rolled this laterally.  Laterally there was a lot of scar present.  There are multiple areas that were bleeding that I oversewed with 3-0 Vicryl suture.  I then entered into the exact axilla.  Her axilla still had some bulky lymphadenopathy that was scarred to the axillary vein as well as the chest wall.  With careful dissection I was able to identify the long thoracic nerve as well the thoracodorsal bundle.  I preserve these.  I did sacrifice the intercostal brachial nerve as it was traveling through the lymph nodes.  Eventually I was able to sweep the lymph nodes caudad off of the axillary vein.  There are multiple clips on the axillary vein as it was stuck to the vein and parts.  I then remove this and passed this off the table as a specimen.  There was some nodes above the axillary vein underneath the pectoralis major that were also present that I removed.  There was no more palpable adenopathy present upon completion.  I then obtained hemostasis.  I placed some Surgicel snow way high in the axilla.  I placed 2 19 French Blake drains.  I secured this with 2-0 nylon suture.  I then closed the incision with 3-0 Vicryl.  4 Monocryl was used to close the skin.  Glue and Steri-Strips were applied.  She tolerated this well was extubated transferred to recovery stable.

## 2020-09-20 NOTE — Discharge Instructions (Signed)
[facility] °[phone] ° °MASTECTOMY: POST OP INSTRUCTIONS °Take 400 mg of ibuprofen every 8 hours or 650 mg tylenol every 6 hours for next 72 hours then as needed. Use ice several times daily also. °Always review your discharge instruction sheet given to you by the facility where your surgery was performed. ° °IF YOU HAVE DISABILITY OR FAMILY LEAVE FORMS, YOU MUST BRING THEM TO THE OFFICE FOR PROCESSING.   °DO NOT GIVE THEM TO YOUR DOCTOR. °A prescription for pain medication may be given to you upon discharge.  Take your pain medication as prescribed, if needed.  If narcotic pain medicine is not needed, then you may take acetaminophen (Tylenol), naprosyn (Alleve) or ibuprofen (Advil) as needed. °1. Take your usually prescribed medications unless otherwise directed. °2. If you need a refill on your pain medication, please contact your pharmacy.  They will contact our office to request authorization.  Prescriptions will not be filled after 5pm or on week-ends. °3. You should follow a light diet the first few days after arrival home, such as soup and crackers, etc.  Resume your normal diet the day after surgery. °4. Most patients will experience some swelling and bruising on the chest and underarm.  Ice packs will help.  Swelling and bruising can take several days to resolve. Wear the binder day and night until you return to the office.  °5. It is common to experience some constipation if taking pain medication after surgery.  Increasing fluid intake and taking a stool softener (such as Colace) will usually help or prevent this problem from occurring.  A mild laxative (Milk of Magnesia or Miralax) should be taken according to package instructions if there are no bowel movements after 48 hours. °6. Unless discharge instructions indicate otherwise, leave your bandage dry and in place until your next appointment in 3-5 days.  You may take a limited sponge bath.  No tube baths or showers until the  drains are removed.  You may have steri-strips (small skin tapes) in place directly over the incision.  These strips should be left on the skin for 7-10 days. If you have glue it will come off in next couple week.  Any sutures will be removed at an office visit °7. DRAINS:  If you have drains in place, it is important to keep a list of the amount of drainage produced each day in your drains.  Before leaving the hospital, you should be instructed on drain care.  Call our office if you have any questions about your drains. I will remove your drains when they put out less than 30 cc or ml for 2 consecutive days. °8. ACTIVITIES:  You may resume regular (light) daily activities beginning the next day--such as daily self-care, walking, climbing stairs--gradually increasing activities as tolerated.  You may have sexual intercourse when it is comfortable.  Refrain from any heavy lifting or straining until approved by your doctor. °a. You may drive when you are no longer taking prescription pain medication, you can comfortably wear a seatbelt, and you can safely maneuver your car and apply brakes. °b. RETURN TO WORK:  __________________________________________________________ °9. You should see your doctor in the office for a follow-up appointment approximately 3-5 days after your surgery.  Your doctor’s nurse will typically make your follow-up appointment when she calls you with your pathology report.  Expect your pathology report 3-4business days after surgery. °10. OTHER INSTRUCTIONS: ______________________________________________________________________________________________ ____________________________________________________________________________________________ °WHEN TO CALL YOUR DR Reyhan Moronta: °1. Fever over 101.0 °  2. Nausea and/or vomiting °3. Extreme swelling or bruising °4. Continued bleeding from incision. °5. Increased pain, redness, or drainage from the incision. °The clinic staff is available to answer your  questions during regular business hours.  Please don’t hesitate to call and ask to speak to one of the nurses for clinical concerns.  If you have a medical emergency, go to the nearest emergency room or call 911.  A surgeon from [facility] is always on call at the hospital. °[address], [address], [place], [place]  [address] ? [address] [address], [place],    [address] °[phone] ? [phone] ? FAX [phone] °Web site: [email or web address] ° °

## 2020-09-21 ENCOUNTER — Encounter (HOSPITAL_COMMUNITY): Payer: Self-pay | Admitting: General Surgery

## 2020-09-21 DIAGNOSIS — C50912 Malignant neoplasm of unspecified site of left female breast: Secondary | ICD-10-CM | POA: Diagnosis not present

## 2020-09-21 MED ORDER — TRAMADOL HCL 50 MG PO TABS
100.0000 mg | ORAL_TABLET | Freq: Four times a day (QID) | ORAL | Status: DC | PRN
  Administered 2020-09-21: 100 mg via ORAL
  Filled 2020-09-21: qty 2

## 2020-09-21 MED ORDER — METHOCARBAMOL 500 MG PO TABS: 500.0000 mg | ORAL_TABLET | Freq: Four times a day (QID) | ORAL | 2 refills | Status: DC | PRN

## 2020-09-21 NOTE — Progress Notes (Signed)
Patient discharged to home. Verbalizes understanding of all discharge instructions including incision care, drain care, discharge medications, and follow up MD visits.   Patient and mother taught how to empty drains and recharge.

## 2020-09-21 NOTE — Discharge Summary (Signed)
Physician Discharge Summary  Patient ID: Caitlyn Hendricks MRN: [id number] DOB/AGE: [birth year] 40 y.o.  Admit date: 09/20/2020 Discharge date: 09/21/2020  Admission Diagnoses: IBC s/p primary chemotherapy  Discharge Diagnoses:  Active Problems:   S/P mastectomy, left   Discharged Condition: good  Hospital Course: 40 yof s/p primary chemotherapy for IBC. Underwent mrm. Doing well following day and will be discharged home  Consults: None  Significant Diagnostic Studies: none  Treatments: surgery: left mrm  Discharge Exam: Blood pressure 104/70, pulse 60, temperature 97.8 F (36.6 C), temperature source Oral, resp. rate 18, height 5\' 4"  (1.626 m), weight 95.3 kg, SpO2 100 %. no hematoma, drains as expected  Disposition: Discharge disposition: 01-Home or Self Care        Allergies as of 09/21/2020      Reactions   Medroxyprogesterone Other (See Comments)   Heavy Bleeding Heavy Bleeding      Medication List    TAKE these medications   ALPRAZolam 1 MG tablet Commonly known as: XANAX Take 1 mg by mouth 3 (three) times daily as needed for anxiety.   azithromycin 250 MG tablet Commonly known as: Zithromax Take as prescribed   baclofen 10 MG tablet Commonly known as: LIORESAL Take 1 tablet (10 mg total) by mouth 2 (two) times daily as needed for muscle spasms.   ciprofloxacin 500 MG tablet Commonly known as: Cipro Take 1 tablet (500 mg total) by mouth 2 (two) times daily.   citalopram 20 MG tablet Commonly known as: CELEXA Take 20 mg by mouth daily.   dexamethasone 4 MG tablet Commonly known as: DECADRON Take 2 tablets by mouth daily starting the day after Carboplatin and Cytoxan x 3 days. Take with food.   diphenhydrAMINE 25 mg capsule Commonly known as: BENADRYL Take 25 mg by mouth every 6 (six) hours as needed for allergies.   fluconazole 150 MG tablet Commonly known as: Diflucan Take 1 tablet for a yeast infection if needed, may repeat in 3 days if needed  Use if needed while on antibiotic.   fluticasone 50 MCG/ACT nasal spray Commonly known as: FLONASE Place 2 sprays into both nostrils daily as needed for allergies or rhinitis.   furosemide 20 MG tablet Commonly known as: LASIX Take 1 tablet (20 mg total) by mouth daily as needed for edema.   gabapentin 100 MG capsule Commonly known as: Neurontin Take 1 capsule (100 mg total) by mouth 3 (three) times daily. May increase to 300mg  three times daily as need in a few weeks if tolerates well What changed:   when to take this  reasons to take this   ibuprofen 200 MG tablet Commonly known as: ADVIL Take 800 mg by mouth every 8 (eight) hours as needed for moderate pain.   lidocaine-prilocaine cream Commonly known as: EMLA Apply to affected area once What changed:   how much to take  how to take this  when to take this  reasons to take this   magic mouthwash w/lidocaine Soln Take 5 mLs by mouth 4 (four) times daily as needed for mouth pain.   methocarbamol 500 MG tablet Commonly known as: ROBAXIN Take 1 tablet (500 mg total) by mouth every 6 (six) hours as needed for muscle spasms.   methylPREDNISolone 4 MG Tbpk tablet Commonly known as: MEDROL DOSEPAK Take 1 tablet (4 mg total) by mouth taper from 4 doses each day to 1 dose and stop.   metroNIDAZOLE 0.75 % vaginal gel Commonly known as: METROGEL Place 1 Applicatorful vaginally  2 (two) times daily.   omeprazole 20 MG capsule Commonly known as: PRILOSEC TAKE 1 CAPSULE (20 MG TOTAL) BY MOUTH 2 (TWO) TIMES DAILY BEFORE A MEAL. What changed:   when to take this  reasons to take this   ondansetron 8 MG tablet Commonly known as: Zofran Take 1 tablet (8 mg total) by mouth 2 (two) times daily as needed. Start on the third day after chemotherapy.   prochlorperazine 10 MG tablet Commonly known as: COMPAZINE Take 1 tablet (10 mg total) by mouth every 6 (six) hours as needed (Nausea or vomiting).   traMADol 50 MG  tablet Commonly known as: ULTRAM Take 1 tablet (50 mg total) by mouth daily as needed for severe pain (headache).   triamcinolone ointment 0.5 % Commonly known as: KENALOG Apply 1 application topically 2 (two) times daily.   zolpidem 10 MG tablet Commonly known as: AMBIEN Take 10 mg by mouth at bedtime as needed for sleep.   ZzzQuil 50 MG/30ML Liqd Generic drug: diphenhydrAMINE HCl Take 30 mLs by mouth at bedtime as needed (sleep).       Follow-up Information    [name], MD In 2 weeks.   Specialty: General Surgery Contact information: [address] [place] [address] [phone]               Signed: [name] 09/21/2020, 8:34 AM

## 2020-09-22 ENCOUNTER — Other Ambulatory Visit: Payer: Self-pay | Admitting: Nurse Practitioner

## 2020-09-26 ENCOUNTER — Encounter: Payer: Self-pay | Admitting: *Deleted

## 2020-09-26 DIAGNOSIS — C50112 Malignant neoplasm of central portion of left female breast: Secondary | ICD-10-CM

## 2020-09-26 LAB — SURGICAL PATHOLOGY

## 2020-09-27 ENCOUNTER — Other Ambulatory Visit: Payer: 59

## 2020-09-27 ENCOUNTER — Ambulatory Visit: Payer: 59

## 2020-09-29 NOTE — Progress Notes (Signed)
[place]   [phone] [phone]   Clinic Follow up Note   Patient Care Team: [name], MD as PCP - General (Family Medicine) Mauro Kaufmann, RN as Oncology Nurse Navigator Rockwell Germany, RN as Oncology Nurse Navigator [name], MD as Consulting Physician (General Surgery) Truitt Merle, MD as Consulting Physician (Hematology)  Date of Service:  09/29/2020  CHIEF COMPLAINT: F/u of left breast cancer   SUMMARY OF ONCOLOGIC HISTORY: Oncology History Overview Note  Cancer Staging Cancer of central portion of left female breast [facility]) Staging form: Breast, AJCC 8th Edition - Clinical: No stage assigned - Unsigned    Cancer of central portion of left female breast ([place])  02/23/2020 Mammogram   Diagnostic Mammogram 02/23/20  IMPRESSION The 2x1x2.6cm irregular mass in teh left breast at 12:00 posiiton middle depth is highly suspicious of malignancy. An [place] is recommended for further evaluation and biopsy planning purposes.   02/23/2020 Breast US   [name] 02/23/20  IMPRESSION 2 adjacent spiculated masses in the left brast at 12:00 position 3 cm from the nipple (2.1x0.9x1.1cm and 1.1x1.4x0.5cm) is suggestive of malignancy.   Multiple abnormal left subpectoral and left axillary nodes measuring 3.3x2.1 cm concerning for metastatic adenopathy.    Left breast skin thickening and edema may be secondary congestive edema due to extensive axillary adenopathy.    03/08/2020 Initial Biopsy   Diagnosis 1.Breast, left, needle core biopsy, 12:00 position, 3cmfn -INVASIVE DUCTAL CARCINOMA -SEE COMMENT  2. Lymph node, needle/core biopsy, left axilla -METASTATIC CARCINOMA INVOLVING A LYMPH NODE  -LYMPHOVASCULAR SPACE INVASION PRESENT   Microscopic Comment  1.Based on the biopsy the carcinoma appears Nottingham Grade 3 or 3 and measures 1 cm in the greatest linear extent.    03/08/2020 Receptors her2   ER- Negative 0% PR - Negative  0% HER2 - Negative  KI 67 - 80%    03/08/2020 Cancer Staging   Staging form: Breast, AJCC 8th Edition - Clinical stage from 03/08/2020: Stage IIIB (cT2, cN1, cM0, G3, ER-, PR-, HER2-) - Signed by Truitt Merle, MD on 03/10/2020   03/10/2020 Initial Diagnosis   Cancer of central portion of left female breast ([place])   03/16/2020 Breast MRI   IMPRESSION: 1. 8.1 x 7.8 x 6.6 cm biopsy proven invasive ductal carcinoma in the central right breast, involving 3 quadrants. 2. 3.0 x 1.7 x 1.1 cm satellite mass more inferiorly in lower inner quadrant of the left breast, compatible with additional malignancy. 3. Metastatic level 1 and level 2 left axillary lymph nodes. 4. No evidence of malignancy on the right.   03/17/2020 Imaging   IMPRESSION: CT CAP w contrast  1. Diffuse skin thickening in the left breast with left axillary and subpectoral lymphadenopathy, as well as a mildly enlarged left supraclavicular lymph node, which likely represents metastatic lymphadenopathy. No other definite extra nodal metastatic disease noted elsewhere in the chest, abdomen or pelvis. 2. Large mass in the central anatomic pelvis which is of uncertain origin, potentially a large exophytic fibroid or a large solid mass arising from the right ovary. Further evaluation with pelvic ultrasound is strongly recommended.   03/21/2020 Imaging   Bone Scan  IMPRESSION: Apparent arthropathy at L5. No bony metastatic disease is demonstrable on this study. Scattered foci of abnormal uptake in a pelvic mass is of uncertain etiology given absence of calcification in this mass by CT. This mass compresses the urinary bladder. It is possible that some of the increased uptake in  this area actually represents physiologic uptake within the bladder.   Kidneys noted in flank positions bilaterally.     03/22/2020 - 08/16/2020 Neo-Adjuvant Chemotherapy   Neoadjuvant Adriamycin and Cytoxan q2weeks for 4 cycles starting 03/22/20-05/03/20 followed  by weekly Taxol and Carboplatin for 12 weeks starting 05/17/20-08/16/20   03/24/2020 Imaging   US Pelvis  IMPRESSION: 1. Large pedunculated lesion directly contiguous with the uterine most suggestive of a large subserosal fibroid which measures up to 8.2 cm. 2. Additional 1 cm probable intramural fibroid in the right anterior uterine body. 3. No other acute or worrisome pelvic abnormality.   03/29/2020 Genetic Testing   Negative genetic testing on the common hereditary cancer panel.  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  The Common Hereditary Gene Panel offered by [facility] includes sequencing and/or deletion duplication testing of the following 47 genes: APC, ATM, AXIN2, BARD1, BMPR1A, BRCA1, BRCA2, BRIP1, CDH1, CDK4, CDKN2A (p14ARF), CDKN2A (p16INK4a), CHEK2, CTNNA1, DICER1, EPCAM (Deletion/duplication testing only), GREM1 (promoter region deletion/duplication testing only), KIT, MEN1, MLH1, MSH2, MSH3, MSH6, MUTYH, NBN, NF1, NHTL1, PALB2, PDGFRA, PMS2, POLD1, POLE, PTEN, RAD50, RAD51C, RAD51D, SDHB, SDHC, SDHD, SMAD4, SMARCA4. STK11, TP53, TSC1, TSC2, and VHL.  The following genes were evaluated for sequence changes only: SDHA and HOXB13 c.251G>A variant only. The report date is 03/29/2020   04/28/2020 -  Antibody Plan   Added Keytruda q3weeks starting 04/28/20 to complete 1 year of treatment    08/17/2020 Breast MRI   IMPRESSION: 1. The biopsy proven malignancy in the left breast and the adjacent satellite lesion have resolved in the interval. No abnormalities in these locations today. 2. No MRI evidence of malignancy in the right breast. 3. No adenopathy identified today      CURRENT THERAPY:  Added Keytruda q3weeks on 04/28/20 to complete 1 year of treatment.  PENDING Adjuvant Radiation  PENDING Adjuvant Xeloda   INTERVAL HISTORY:  Caitlyn Hendricks is here for a follow up. She presents to the clinic alone. She notes pain from her surgery. Her pain is mostly in her left axillary. She notes  chemo was more tolerable than her surgery recovery. She notes she does have numbness at site. She has improving ROM, but plans to proceed with PT soon. She notes her draining tubes were removed yesterday and plans to f/u with Dr Donne Hazel. She is planning to proceed with reconstruction surgery end of 2022. She notes her last period was 6 months ago (03/2020), first day of chemo. She notes her hot flashes have improved off chemotherapy, now manageable.    REVIEW OF SYSTEMS:   Constitutional: Denies fevers, chills or abnormal weight loss (+) Improved hot flashes  Eyes: Denies blurriness of vision Ears, nose, mouth, throat, and face: Denies mucositis or sore throat Respiratory: Denies cough, dyspnea or wheezes Cardiovascular: Denies palpitation, chest discomfort or lower extremity swelling Gastrointestinal:  Denies nausea, heartburn or change in bowel habits Skin: Denies abnormal skin rashes Lymphatics: Denies new lymphadenopathy or easy bruising Neurological:Denies numbness, tingling or new weaknesses Behavioral/Psych: Mood is stable, no new changes  Breast: (+) Left breast pain, tenderness with decreased left arm ROM All other systems were reviewed with the patient and are negative.  MEDICAL HISTORY:  Past Medical History:  Diagnosis Date   Anemia    Anxiety    Arthritis    Cancer ([place])    Left Breast   Depression    GERD (gastroesophageal reflux disease)    Headache    Panic attack     SURGICAL  HISTORY: Past Surgical History:  Procedure Laterality Date   MASTECTOMY MODIFIED RADICAL Left 09/20/2020   Procedure: LEFT MODIFIED RADICAL MASTECTOMY;  Surgeon: [name], MD;  Location: [place];  Service: General;  Laterality: Left;  RNFA; PEC BLOCK;   PORTACATH PLACEMENT Right 03/21/2020   Procedure: INSERTION PORT-A-CATH WITH ULTRASOUND GUIDANCE;  Surgeon: [name], MD;  Location: [place];  Service: General;  Laterality: Right;    I have  reviewed the social history and family history with the patient and they are unchanged from previous note.  ALLERGIES:  is allergic to medroxyprogesterone.  MEDICATIONS:  Current Outpatient Medications  Medication Sig Dispense Refill   ALPRAZolam (XANAX) 1 MG tablet Take 1 mg by mouth 3 (three) times daily as needed for anxiety.      azithromycin (ZITHROMAX) 250 MG tablet Take as prescribed (Patient not taking: Reported on 09/08/2020) 6 each 0   baclofen (LIORESAL) 10 MG tablet Take 1 tablet (10 mg total) by mouth 2 (two) times daily as needed for muscle spasms. 30 each 0   ciprofloxacin (CIPRO) 500 MG tablet Take 1 tablet (500 mg total) by mouth 2 (two) times daily. (Patient not taking: Reported on 09/08/2020) 10 tablet 0   citalopram (CELEXA) 20 MG tablet Take 20 mg by mouth daily.     dexamethasone (DECADRON) 4 MG tablet Take 2 tablets by mouth daily starting the day after Carboplatin and Cytoxan x 3 days. Take with food. (Patient not taking: Reported on 09/08/2020) 30 tablet 1   diphenhydrAMINE (BENADRYL) 25 mg capsule Take 25 mg by mouth every 6 (six) hours as needed for allergies.     diphenhydrAMINE HCl (ZZZQUIL) 50 MG/30ML LIQD Take 30 mLs by mouth at bedtime as needed (sleep).     fluconazole (DIFLUCAN) 150 MG tablet Take 1 tablet for a yeast infection if needed, may repeat in 3 days if needed Use if needed while on antibiotic. (Patient not taking: Reported on 09/08/2020) 2 tablet 0   fluticasone (FLONASE) 50 MCG/ACT nasal spray Place 2 sprays into both nostrils daily as needed for allergies or rhinitis.     furosemide (LASIX) 20 MG tablet Take 1 tablet (20 mg total) by mouth daily as needed for edema. (Patient not taking: Reported on 09/08/2020) 10 tablet 0   gabapentin (NEURONTIN) 100 MG capsule Take 1 capsule (100 mg total) by mouth 3 (three) times daily. May increase to 362m three times daily as need in a few weeks if tolerates well (Patient taking differently: Take 100 mg by mouth  3 (three) times daily as needed (hot flashes). May increase to 30108mthree times daily as need in a few weeks if tolerates well) 90 capsule 0   ibuprofen (ADVIL) 200 MG tablet Take 800 mg by mouth every 8 (eight) hours as needed for moderate pain.     lidocaine-prilocaine (EMLA) cream Apply to affected area once (Patient taking differently: Apply 1 application topically as needed (port access). Apply to affected area once) 30 g 3   magic mouthwash w/lidocaine SOLN Take 5 mLs by mouth 4 (four) times daily as needed for mouth pain. (Patient not taking: Reported on 09/08/2020) 480 mL 1   methocarbamol (ROBAXIN) 500 MG tablet Take 1 tablet (500 mg total) by mouth every 6 (six) hours as needed for muscle spasms. 30 tablet 2   methylPREDNISolone (MEDROL DOSEPAK) 4 MG TBPK tablet Take 1 tablet (4 mg total) by mouth taper from 4 doses each day to 1 dose and stop. (Patient  not taking: Reported on 09/08/2020) 1 each 0   metroNIDAZOLE (METROGEL) 0.75 % vaginal gel Place 1 Applicatorful vaginally 2 (two) times daily. (Patient not taking: Reported on 09/08/2020) 70 g 1   omeprazole (PRILOSEC) 20 MG capsule TAKE 1 CAPSULE (20 MG TOTAL) BY MOUTH 2 (TWO) TIMES DAILY BEFORE A MEAL. (Patient taking differently: Take 20 mg by mouth 2 (two) times daily as needed (heartburn).) 180 capsule 1   ondansetron (ZOFRAN) 8 MG tablet Take 1 tablet (8 mg total) by mouth 2 (two) times daily as needed. Start on the third day after chemotherapy. (Patient not taking: Reported on 09/08/2020) 30 tablet 2   prochlorperazine (COMPAZINE) 10 MG tablet Take 1 tablet (10 mg total) by mouth every 6 (six) hours as needed (Nausea or vomiting). 30 tablet 2   traMADol (ULTRAM) 50 MG tablet Take 1 tablet (50 mg total) by mouth daily as needed for severe pain (headache). 30 tablet 0   triamcinolone ointment (KENALOG) 0.5 % Apply 1 application topically 2 (two) times daily. (Patient not taking: Reported on 09/08/2020) 30 g 2   zolpidem (AMBIEN) 10 MG  tablet Take 10 mg by mouth at bedtime as needed for sleep.     No current facility-administered medications for this visit.   Facility-Administered Medications Ordered in Other Visits  Medication Dose Route Frequency Provider Last Rate Last Admin   sodium chloride flush (NS) 0.9 % injection 10 mL  10 mL Intravenous PRN Alla Feeling, NP   10 mL at 03/28/20 1104    PHYSICAL EXAMINATION: ECOG PERFORMANCE STATUS: 1 - Symptomatic but completely ambulatory  There were no vitals filed for this visit. There were no vitals filed for this visit.  GENERAL:alert, no distress and comfortable SKIN: skin color, texture, turgor are normal, no rashes or significant lesions EYES: normal, Conjunctiva are pink and non-injected, sclera clear  NECK: supple, thyroid normal size, non-tender, without nodularity LYMPH:  no palpable lymphadenopathy in the cervical, axillary LUNGS: clear to auscultation and percussion with normal breathing effort HEART: regular rate & rhythm and no murmurs and no lower extremity edema ABDOMEN:abdomen soft, non-tender and normal bowel sounds Musculoskeletal:no cyanosis of digits and no clubbing  NEURO: alert & oriented x 3 with fluent speech, no focal motor/sensory deficits BREAST: (+) S/p left mastectomy: Surgical incision is healing well, no discharge. (+) Mild soft tissue fullness at axillary incision, no seroma. (+) Draining tube sight sill open, covered   LABORATORY DATA:  I have reviewed the data as listed CBC Latest Ref Rng & Units 09/07/2020 08/16/2020 08/02/2020  WBC 4.0 - 10.5 K/uL 4.2 8.0 1.7(L)  Hemoglobin 12.0 - 15.0 g/dL 10.9(L) 10.0(L) 9.2(L)  Hematocrit 36.0 - 46.0 % 33.3(L) 31.4(L) 29.5(L)  Platelets 150 - 400 K/uL 237 255 290     CMP Latest Ref Rng & Units 09/07/2020 08/16/2020 08/02/2020  Glucose 70 - 99 mg/dL 91 92 101(H)  BUN 6 - 20 mg/dL _0 Creatinine 0.44 - 1.00 mg/dL 0.77 0.75 0.77  Sodium 135 - 145 mmol/L 139 138 140  Potassium 3.5 - 5.1 mmol/L  4.4 4.1 4.1  Chloride 98 - 111 mmol/L 105 105 108  CO2 22 - 32 mmol/L _1 Calcium 8.9 - 10.3 mg/dL 9.3 9.0 8.9  Total Protein 6.5 - 8.1 g/dL 7.2 6.9 6.5  Total Bilirubin 0.3 - 1.2 mg/dL 0.2(L) 0.3 0.2(L)  Alkaline Phos 38 - 126 U/L 137(H) 152(H) 120  AST 15 - 41 U/L 23 19 26  ALT 0 - 44 U/L 24 20 40      RADIOGRAPHIC STUDIES: I have personally reviewed the radiological images as listed and agreed with the findings in the report. No results found.   ASSESSMENT & PLAN:  Caitlyn Hendricks is a 40 y.o. female with    1. 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 Her 03/08/20 biopsy showed she has grade III invasive ductal carcinoma of left breast metastatic to her left axillary LN. Her ER/PR/HER2 markers were all negative. CT CAP/Bone scan negative for distant mets.  -To downstage disease and reduce risk of recurrence, she completed neoadjuvant chemo with ddAC q2weeks for 4 cycles 03/22/20-05/03/20. Followed by weekly carbo/taxol for 12 weeks 05/17/20-08/16/20 before proceeding with surgery.  -Based on recently publish Keynote 522 trial data, I added Keytruda q3weeks for 1 year treatment on 04/28/20. Tolerating well.  -She underwent left mastectomy with Dr Donne Hazel on 09/20/20. Surgical path shows No residual invasive carcinoma in breast status post neoadjuvant treatment, however has 7/19 positive LN. I discussed with this many positive LNs, she has very high risk of cancer recurrence. I personally reviewed with patient today.  -I recommend adjuvant chemotherapy with oral Xeloda for 6 months to reduce her risk of cancer recurrence. She will take with RT at lower dose 1542m BID M-F, otherwise will continue 2 weeks on/1  week off.   --Chemotherapy consent: Side effects including but does not not limited to, fatigue, nausea, vomiting, diarrhea, hair loss, neuropathy, fluid retention, renal and kidney dysfunction, neutropenic fever, needed for blood transfusion, bleeding, were discussed with patient in great detail. She agrees to proceed. -Given positive LN on surgical path, I also recommend she proceed with post-mastectomy radiation with Dr KSondra Cometo reduce her risk of local recurrence.  -She will continue Keytruda q3weeks to complete her 1 year of treatment.  -She is still recovering from her surgery. Her surgical incisions still healing with mild soft tissue fullness. She has mild to moderate pain at site and decreased ROM. She will proceed with PT on 10/10/20.  -Plan to start Radiation and Xeloda in 3-4 weeks. After adjuvant chemo will proceed with close cancer surveillance. Will scan her for further evaluation or sooner if she develops concerning symptoms. Will continue to check her tumor marker CA 27.79 every 3 months. I will probably get surveillance CT bone scan or PET scan every 6 months for the next few years. -She is planning to proceed with reconstruction surgery end of 2022. Can have port removed at that time. -Labs reviewed and overall adequate to proceed with Keytruda today, continue every 3 weeks  -F/u in 3 weeks.     2. Genetic Testing negative for pathogenetic mutations with VUS of POLE gene   3. Anxiety/Depression, Social Support  -She is on Xanax 191mTID and Celexa 4028mnd low dose Ambien for sleep.  -With stress she would previously binge drink occasionally. She quit drinking 02/09/20. She does not plan to restart smoking or vaping.  -She has very good social support from mother and boyfriend. 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  -Mood is stable.    4. Hot flashes -I discussed chemo will put her in perimenopause, so symptoms of hot flashes  can happen. Last full period  with start of chemo (03/2020) -Her hot flashes initially, significantly increased on chemo. Now off chemo and hot flashes have much improved.  -She is on Celexa 15m currently. She is also on Gabapentin 1016m  -Hot flashes improved off chemotherapy.    5. Dyspnea on exertion, tachycardia, deconditioning -secondary to chemo  -I encouraged her to exercise, and stay active. -Her breathing is overall stable with occasional SOB upon exertion.    6. COVID (+) on 08/02/20 with symptoms of Hoarseness and ribcage pain. She has recovered quickly and completely       PLAN:  -Labs reviewed and adequate to proceed with Keytruda  -Lab, flush, F/u Keytruda in 3 weeks  -I called in Xeloda today, she will start with concurrent radiation 1500 mg twice daily Monday through Friday   No problem-specific Assessment & Plan notes found for this encounter.   No orders of the defined types were placed in this encounter.  All questions were answered. The patient knows to call the clinic with any problems, questions or concerns. No barriers to learning was detected. The total time spent in the appointment was 40 minutes.     Caitlyn Hendricks 09/29/2020   I, Caitlyn Devonam acting as scribe for Caitlyn Hendricks.   I have reviewed the above documentation for accuracy and completeness, and I agree with the above.

## 2020-10-03 ENCOUNTER — Other Ambulatory Visit: Payer: Self-pay | Admitting: *Deleted

## 2020-10-03 DIAGNOSIS — Z171 Estrogen receptor negative status [ER-]: Secondary | ICD-10-CM

## 2020-10-03 DIAGNOSIS — C50112 Malignant neoplasm of central portion of left female breast: Secondary | ICD-10-CM

## 2020-10-04 ENCOUNTER — Telehealth: Payer: Self-pay | Admitting: Pharmacist

## 2020-10-04 ENCOUNTER — Encounter: Payer: Self-pay | Admitting: *Deleted

## 2020-10-04 ENCOUNTER — Inpatient Hospital Stay: Payer: 59

## 2020-10-04 ENCOUNTER — Other Ambulatory Visit: Payer: Self-pay

## 2020-10-04 ENCOUNTER — Telehealth: Payer: Self-pay

## 2020-10-04 ENCOUNTER — Other Ambulatory Visit: Payer: Self-pay | Admitting: Hematology

## 2020-10-04 ENCOUNTER — Inpatient Hospital Stay (HOSPITAL_BASED_OUTPATIENT_CLINIC_OR_DEPARTMENT_OTHER): Payer: 59 | Admitting: Hematology

## 2020-10-04 VITALS — HR 89

## 2020-10-04 VITALS — BP 122/94 | HR 126 | Temp 97.7°F | Resp 20 | Ht 64.0 in | Wt 206.1 lb

## 2020-10-04 DIAGNOSIS — Z171 Estrogen receptor negative status [ER-]: Secondary | ICD-10-CM

## 2020-10-04 DIAGNOSIS — C50112 Malignant neoplasm of central portion of left female breast: Secondary | ICD-10-CM

## 2020-10-04 DIAGNOSIS — Z5112 Encounter for antineoplastic immunotherapy: Secondary | ICD-10-CM | POA: Diagnosis not present

## 2020-10-04 LAB — CBC WITH DIFFERENTIAL (CANCER CENTER ONLY)
Abs Immature Granulocytes: 0.02 10*3/uL (ref 0.00–0.07)
Basophils Absolute: 0 10*3/uL (ref 0.0–0.1)
Basophils Relative: 0 %
Eosinophils Absolute: 0 10*3/uL (ref 0.0–0.5)
Eosinophils Relative: 0 %
HCT: 35.4 % — ABNORMAL LOW (ref 36.0–46.0)
Hemoglobin: 11.5 g/dL — ABNORMAL LOW (ref 12.0–15.0)
Immature Granulocytes: 0 %
Lymphocytes Relative: 14 %
Lymphs Abs: 1 10*3/uL (ref 0.7–4.0)
MCH: 30.2 pg (ref 26.0–34.0)
MCHC: 32.5 g/dL (ref 30.0–36.0)
MCV: 92.9 fL (ref 80.0–100.0)
Monocytes Absolute: 0.4 10*3/uL (ref 0.1–1.0)
Monocytes Relative: 5 %
Neutro Abs: 5.7 10*3/uL (ref 1.7–7.7)
Neutrophils Relative %: 81 %
Platelet Count: 384 10*3/uL (ref 150–400)
RBC: 3.81 MIL/uL — ABNORMAL LOW (ref 3.87–5.11)
RDW: 14.5 % (ref 11.5–15.5)
WBC Count: 7.2 10*3/uL (ref 4.0–10.5)
nRBC: 0 % (ref 0.0–0.2)

## 2020-10-04 LAB — CMP (CANCER CENTER ONLY)
ALT: 31 U/L (ref 0–44)
AST: 26 U/L (ref 15–41)
Albumin: 3.8 g/dL (ref 3.5–5.0)
Alkaline Phosphatase: 184 U/L — ABNORMAL HIGH (ref 38–126)
Anion gap: 14 (ref 5–15)
BUN: 16 mg/dL (ref 6–20)
CO2: 25 mmol/L (ref 22–32)
Calcium: 9.4 mg/dL (ref 8.9–10.3)
Chloride: 102 mmol/L (ref 98–111)
Creatinine: 0.83 mg/dL (ref 0.44–1.00)
GFR, Estimated: >60 mL/min (ref >60)
Glucose, Bld: 136 mg/dL — ABNORMAL HIGH (ref 70–99)
Potassium: 3.8 mmol/L (ref 3.5–5.1)
Sodium: 141 mmol/L (ref 135–145)
Total Bilirubin: 0.3 mg/dL (ref 0.3–1.2)
Total Protein: 8 g/dL (ref 6.5–8.1)

## 2020-10-04 MED ORDER — SODIUM CHLORIDE 0.9 % IV SOLN
Freq: Once | INTRAVENOUS | Status: AC
  Filled 2020-10-04: qty 250

## 2020-10-04 MED ORDER — SODIUM CHLORIDE 0.9% FLUSH
10.0000 mL | INTRAVENOUS | Status: DC | PRN
  Administered 2020-10-04: 10 mL
  Filled 2020-10-04: qty 10

## 2020-10-04 MED ORDER — SODIUM CHLORIDE 0.9 % IV SOLN
200.0000 mg | Freq: Once | INTRAVENOUS | Status: AC
  Administered 2020-10-04: 200 mg via INTRAVENOUS
  Filled 2020-10-04: qty 8

## 2020-10-04 MED ORDER — CAPECITABINE 500 MG PO TABS: 1500.0000 mg | ORAL_TABLET | Freq: Two times a day (BID) | ORAL | 1 refills | Status: DC

## 2020-10-04 MED ORDER — HEPARIN SOD (PORK) LOCK FLUSH 100 UNIT/ML IV SOLN
500.0000 [IU] | Freq: Once | INTRAVENOUS | Status: AC | PRN
  Administered 2020-10-04: 500 [IU]
  Filled 2020-10-04: qty 5

## 2020-10-04 NOTE — Telephone Encounter (Signed)
Oral Oncology Patient Advocate Encounter  Received notification from [facility] that prior authorization for Xeloda is required.  PA submitted on CoverMyMeds Key [id number] Status is pending  Oral Oncology Clinic will continue to follow.  [place] Patient [place] Phone [phone] Fax [phone] 10/04/2020 12:45 PM

## 2020-10-04 NOTE — Patient Instructions (Signed)

## 2020-10-04 NOTE — Telephone Encounter (Signed)
Oral Oncology Patient Advocate Encounter  Prior Authorization for Xeloda has been approved.    PA# [id number] Effective dates: 10/04/20 through 10/03/21  Patient must fill with [facility] will continue to follow.   [place] Patient [place] Phone [phone] Fax [phone] 10/04/2020 1:55 PM

## 2020-10-04 NOTE — Patient Instructions (Signed)
[facility] °Discharge Instructions for Patients Receiving Chemotherapy ° °Today you received the following immunotherapy agent: Pembrolizumab (Keytruda) ° °To help prevent nausea and vomiting after your treatment, we encourage you to take your nausea medication as directed by your MD. °  °If you develop nausea and vomiting that is not controlled by your nausea medication, call the clinic.  ° °BELOW ARE SYMPTOMS THAT SHOULD BE REPORTED IMMEDIATELY: °· *FEVER GREATER THAN 100.5 F °· *CHILLS WITH OR WITHOUT FEVER °· NAUSEA AND VOMITING THAT IS NOT CONTROLLED WITH YOUR NAUSEA MEDICATION °· *UNUSUAL SHORTNESS OF BREATH °· *UNUSUAL BRUISING OR BLEEDING °· TENDERNESS IN MOUTH AND THROAT WITH OR WITHOUT PRESENCE OF ULCERS °· *URINARY PROBLEMS °· *BOWEL PROBLEMS °· UNUSUAL RASH °Items with * indicate a potential emergency and should be followed up as soon as possible. ° °Feel free to call the clinic should you have any questions or concerns. The clinic phone number is [phone]. ° °Please show the CHEMO ALERT CARD at check-in to the Emergency Department and triage nurse. ° ° °

## 2020-10-04 NOTE — Telephone Encounter (Signed)
Oral Oncology Pharmacist Encounter  Patient's insurance requires that Xeloda (capecitabine) be filled through [facility]. Prescription redirected to [facility] for dispensing.  Leron Croak, PharmD, BCPS Hematology/Oncology Clinical Pharmacist [facility] [phone] 10/04/2020 1:44 PM

## 2020-10-05 ENCOUNTER — Telehealth: Payer: Self-pay | Admitting: Hematology

## 2020-10-05 ENCOUNTER — Encounter: Payer: Self-pay | Admitting: Hematology

## 2020-10-05 NOTE — Telephone Encounter (Signed)
Oral Oncology Pharmacist Encounter  Received new prescription for Xeloda (capecitabine) for the treatment of triple negative breast cancer in conjunction with radiation, after radiation Xeloda plans to be continued for a total duration of 6 months.  Prescription dose and frequency assessed for appropriateness. Appropriate for therapy initiation.   CBC w/ Diff and CMP from 10/04/20 assessed, no dose adjustments required.  Current medication list in Epic reviewed, DDIs with Xeloda identified: Category C DDI between Xeloda and Omeprazole - proton-pump inhibitors can decrease efficacy of Xeloda - will discuss with patient alternatives to omeprazole, such as H2RA's like famotidine while on Xeloda.  Evaluated chart and no patient barriers to medication adherence noted.   Patient agreement for Xeloda treatment documented in MD note on 10/04/20.  Prescription has been e-scribed to the [facility] for benefits analysis and approval.  Oral Oncology Clinic will continue to follow for insurance authorization, copayment issues, initial counseling and start date.  Leron Croak, PharmD, BCPS Hematology/Oncology Clinical Pharmacist [facility] [phone] 10/05/2020 8:12 AM

## 2020-10-05 NOTE — Telephone Encounter (Signed)
Scheduled appts per 3/31 sch msg. Pt aware.  

## 2020-10-10 ENCOUNTER — Ambulatory Visit: Payer: 59 | Admitting: Physical Therapy

## 2020-10-11 ENCOUNTER — Ambulatory Visit: Payer: 59 | Admitting: Physical Therapy

## 2020-10-11 ENCOUNTER — Encounter: Payer: Self-pay | Admitting: Physical Therapy

## 2020-10-11 ENCOUNTER — Other Ambulatory Visit: Payer: Self-pay

## 2020-10-11 DIAGNOSIS — Z9012 Acquired absence of left breast and nipple: Secondary | ICD-10-CM | POA: Diagnosis not present

## 2020-10-11 DIAGNOSIS — C773 Secondary and unspecified malignant neoplasm of axilla and upper limb lymph nodes: Secondary | ICD-10-CM | POA: Diagnosis not present

## 2020-10-11 DIAGNOSIS — Z483 Aftercare following surgery for neoplasm: Secondary | ICD-10-CM

## 2020-10-11 DIAGNOSIS — Z8616 Personal history of COVID-19: Secondary | ICD-10-CM | POA: Diagnosis not present

## 2020-10-11 DIAGNOSIS — M25612 Stiffness of left shoulder, not elsewhere classified: Secondary | ICD-10-CM

## 2020-10-11 DIAGNOSIS — M129 Arthropathy, unspecified: Secondary | ICD-10-CM | POA: Diagnosis not present

## 2020-10-11 DIAGNOSIS — D649 Anemia, unspecified: Secondary | ICD-10-CM | POA: Diagnosis not present

## 2020-10-11 DIAGNOSIS — Z79899 Other long term (current) drug therapy: Secondary | ICD-10-CM | POA: Diagnosis not present

## 2020-10-11 DIAGNOSIS — R21 Rash and other nonspecific skin eruption: Secondary | ICD-10-CM | POA: Diagnosis not present

## 2020-10-11 DIAGNOSIS — R293 Abnormal posture: Secondary | ICD-10-CM | POA: Insufficient documentation

## 2020-10-11 DIAGNOSIS — C50112 Malignant neoplasm of central portion of left female breast: Secondary | ICD-10-CM | POA: Insufficient documentation

## 2020-10-11 DIAGNOSIS — M545 Low back pain, unspecified: Secondary | ICD-10-CM | POA: Diagnosis not present

## 2020-10-11 DIAGNOSIS — M4317 Spondylolisthesis, lumbosacral region: Secondary | ICD-10-CM | POA: Diagnosis not present

## 2020-10-11 DIAGNOSIS — K219 Gastro-esophageal reflux disease without esophagitis: Secondary | ICD-10-CM | POA: Diagnosis not present

## 2020-10-11 DIAGNOSIS — F418 Other specified anxiety disorders: Secondary | ICD-10-CM | POA: Diagnosis not present

## 2020-10-11 DIAGNOSIS — Z171 Estrogen receptor negative status [ER-]: Secondary | ICD-10-CM | POA: Diagnosis not present

## 2020-10-11 DIAGNOSIS — M79602 Pain in left arm: Secondary | ICD-10-CM | POA: Insufficient documentation

## 2020-10-11 DIAGNOSIS — R232 Flushing: Secondary | ICD-10-CM | POA: Diagnosis not present

## 2020-10-11 DIAGNOSIS — Z510 Encounter for antineoplastic radiation therapy: Secondary | ICD-10-CM | POA: Diagnosis not present

## 2020-10-11 NOTE — Progress Notes (Addendum)
Location of Breast Cancer: left breast   Histology per Pathology Report:    Receptor Status:    Did patient present with symptoms (if so, please note symptoms) or was this found on screening mammography?:    Past/Anticipated interventions by surgeon, if any: Left modified radical mastectomy performed by Dr. Donne Hazel on 09/20/20  Past/Anticipated interventions by medical oncology, if any: Chemotherapy    Dr. Sondra Come    Lymphedema issues, if any:  yes, left axilla    Pain issues, if any:  yes, left arm dull and aching, also has right hip radiating to right leg into heel rated up to 7/10 at night. Has bilateral heel pain. Patient reports occasional ache to right axilla.  SAFETY ISSUES:  Prior radiation? no  Pacemaker/ICD? no  Possible current pregnancy?no  Is the patient on methotrexate? no  Current Complaints / other details:  None   Vitals:   10/18/20 1329  BP: 116/81  Pulse: 95  Resp: 20  Temp: 97.8 F (36.6 C)  SpO2: 100%  Weight: 210 lb (95.3 kg)  Height: 5\' 4"  (1.626 m)

## 2020-10-11 NOTE — Therapy (Signed)
[place], [place], [address] Phone: [phone]   Fax:  [phone]  Physical Therapy Evaluation  Patient Details  Name: Caitlyn Hendricks MRN: [id number] Date of Birth: 07-17-1980 Referring Provider (PT): Donne Hazel   Encounter Date: 10/11/2020   PT End of Session - 10/11/20 1514    Visit Number 2    Number of Visits 10    Date for PT Re-Evaluation 11/08/20    PT Start Time 1406    PT Stop Time 1500    PT Time Calculation (min) 54 min    Activity Tolerance Patient tolerated treatment well    Behavior During Therapy [facility] for tasks assessed/performed           Past Medical History:  Diagnosis Date  . Anemia   . Anxiety   . Arthritis   . Cancer (HCC)    Left Breast  . Depression   . GERD (gastroesophageal reflux disease)   . Headache   . Panic attack     Past Surgical History:  Procedure Laterality Date  . MASTECTOMY MODIFIED RADICAL Left 09/20/2020   Procedure: LEFT MODIFIED RADICAL MASTECTOMY;  Surgeon: [name], MD;  Location: [place];  Service: General;  Laterality: Left;  RNFA; PEC BLOCK;  . PORTACATH PLACEMENT Right 03/21/2020   Procedure: INSERTION PORT-A-CATH WITH ULTRASOUND GUIDANCE;  Surgeon: [name], MD;  Location: [place];  Service: General;  Laterality: Right;    There were no vitals filed for this visit.    Subjective Assessment - 10/11/20 1408    Subjective I am very uncomfortable. It hurts to touch my arm. I have limited range of motion. I feel swollen underneath my arm.    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 No/denies    Pain Score 0-No pain              [facility] PT Assessment -  10/11/20 0001      Balance Screen   Has the patient fallen in the past 6 months No    Has the patient had a decrease in activity level because of a fear of falling?  No    Is the patient reluctant to leave their home because of a fear of falling?  No      Observation/Other Assessments   Observations healing scar on left chest, some fullness at left lateral chest and inferior to incision, cording present throughout L UE      Posture/Postural Control   Posture/Postural Control Postural limitations    Postural Limitations Rounded Shoulders;Forward head      AROM   Left Shoulder Extension 43 Degrees    Left Shoulder Flexion 113 Degrees    Left Shoulder ABduction 96 Degrees    Left Shoulder Internal Rotation 60 Degrees    Left Shoulder External Rotation 78 Degrees             LYMPHEDEMA/ONCOLOGY QUESTIONNAIRE - 10/11/20 0001      Type   Cancer Type left breast cancer      Surgeries   Mastectomy Date 09/20/20    Axillary Lymph Node Dissection Date 09/20/20    Number Lymph Nodes Removed 19   7 positive  Date Lymphedema/Swelling Started   Date 09/20/20      Treatment   Active Chemotherapy Treatment Yes    Past Chemotherapy Treatment Yes    Active Radiation Treatment No   will begin soon   Past Radiation Treatment No    Current Hormone Treatment No    Past Hormone Therapy No      What other symptoms do you have   Are you Having Heaviness or Tightness Yes    Are you having Pain Yes    Are you having pitting edema No    Is it Hard or Difficult finding clothes that fit No    Do you have infections No    Is there Decreased scar mobility Yes      Lymphedema Assessments   Lymphedema Assessments Upper extremities      Right Upper Extremity Lymphedema   15 cm Proximal to Olecranon Process 37.7 cm    Olecranon Process 29 cm    15 cm Proximal to Ulnar Styloid Process 28.5 cm    Just Proximal to Ulnar Styloid Process 17 cm    Across Hand at [employer] 19.5 cm    At  [place] of 2nd Digit 6.5 cm      Left Upper Extremity Lymphedema   15 cm Proximal to Olecranon Process 38 cm    Olecranon Process 28.5 cm    15 cm Proximal to Ulnar Styloid Process 27.3 cm    Just Proximal to Ulnar Styloid Process 17.3 cm    Across Hand at [employer] 19 cm    At [place] of 2nd Digit 6.7 cm                   Objective measurements completed on examination: See above findings.       [facility] Adult PT Treatment/Exercise - 10/11/20 0001      Manual Therapy   Manual Therapy Myofascial release;Passive ROM    Myofascial Release to cording in LUE from forearm to axilla using cross hand technique - pt with decreased sensitivity by end of session and greatly improved AROM by end of session - 2 cords released    Passive ROM to L shoulder in to flexion and abduction                       PT Long Term Goals - 10/11/20 1529      PT LONG TERM GOAL #1   Title Pt will return to baseline shoulder ROM measurements post op to allow pt to return to PLOF.    Time 8    Period Months    Status On-going      PT LONG TERM GOAL #2   Title Pt will demonstrate 170 degrees of L shoulder flexion to allow her to reach overhead.    Baseline 113    Time 4    Period Weeks    Status New    Target Date 11/08/20      PT LONG TERM GOAL #3   Title Pt will demonstrate 170 degrees of L shoulder abduction to allow her to reach out to the side.    Baseline 96    Time 4    Period Weeks    Status New    Target Date 11/08/20      PT LONG TERM GOAL #4   Title Pt will report a 75% improvement in L scapular pain to allow improved comfort.    Time 4  Period Weeks    Status New    Target Date 11/08/20      PT LONG TERM GOAL #5   Title Pt will report a 75% improvement in L arm pain due to cording to allow improved comfort and mobility.    Time 4    Period Weeks    Status New    Target Date 11/08/20      Additional Long Term Goals   Additional Long Term Goals Yes       PT LONG TERM GOAL #6   Title Pt will be independent in a home exercise program for continued strengthening and stretching.    Time 4    Period Weeks    Status New    Target Date 11/08/20                  Plan - 10/11/20 1519    Clinical Impression Statement Pt returns to PT after undergoing a L mastectomy on 09/20/20 with ALNB (7/19). She has very limited L shoulder ROM most likely due to numerous cords palpable in L axilla. She also presents with full at inferior chest and lateral trunk. Pt does not demonstrate any signs of lymphedema in her LUE. The plan is for her to receive radiation soon and she will need to be able to tolerate the position necessary for radation. Focused today on manual therapy to reduce cording with major improvements noted this session. Pt had nearly 75% of full AROM by end of session following myofascial release and PROM to stretch cording. Issued post op ROM exercises so pt can continue stretching cording at home. Pt would benefit from skilled PT services to improve L shoulder ROM, decrease cording, decrease swelling and instruct pt in a home exercise program for continued strengthening and stretching.    Examination-Activity Limitations Carry;Reach Overhead;Lift    Examination-Participation Restrictions Occupation;Cleaning;Laundry    Clinical Decision Making Low    Rehab Potential Good    PT Frequency 2x / week    PT Duration 4 weeks    PT Treatment/Interventions ADLs/Self Care Home Management;Therapeutic exercise;Patient/family education;Therapeutic activities;Manual techniques;Manual lymph drainage;Compression bandaging;Scar mobilization;Passive range of motion;Taping;Vasopneumatic Device;Orthotic Fit/Training    PT Next Visit Plan PROM to L shoulder, MFR to cording, pulleys, ball, STM to L scapular musculature, MLD to L chest/abdomen/trunk    PT Home Exercise Plan post op breast exercises    Consulted and Agree with Plan of Care Patient            Patient will benefit from skilled therapeutic intervention in order to improve the following deficits and impairments:  Pain,Postural dysfunction,Impaired UE functional use,Impaired flexibility,Increased fascial restricitons,Decreased strength,Decreased range of motion,Decreased scar mobility,Increased edema  Visit Diagnosis: Stiffness of left shoulder, not elsewhere classified  Aftercare following surgery for neoplasm  Pain in left arm  Abnormal posture  Malignant neoplasm of central portion of left female breast, unspecified estrogen receptor status ([place])     Problem List Patient Active Problem List   Diagnosis Date Noted  . S/P mastectomy, left 09/20/2020  . Genetic testing 08/03/2020  . Port-A-Cath in place 05/31/2020  . Cancer of central portion of left female breast ([place]) 03/10/2020    Allyson Sabal [facility] 10/11/2020, 3:32 PM  [place], [place], [address] Phone: [phone]   Fax:  [phone]  Name: Caitlyn Hendricks MRN: [id number] Date of Birth: July 07, 1981  Manus Gunning, PT 10/11/20 3:32 PM

## 2020-10-16 MED ORDER — CAPECITABINE 500 MG PO TABS: 1500.0000 mg | ORAL_TABLET | Freq: Two times a day (BID) | ORAL | 1 refills | Status: DC

## 2020-10-16 NOTE — Telephone Encounter (Signed)
Oral Oncology Pharmacist Encounter  Called to check on status of Xeloda (capecitabine) prescription that was being filled through [facility]. Notified that [facility] is unable to fill for patient's insurance and that the patient is required to fill through [facility] by [employer].  Prescription redirected to [facility] by [employer] and supporting documents faxed to pharmacy for dispensing. Pharmacy updated in patient's chart.   Leron Croak, PharmD, BCPS Hematology/Oncology Clinical Pharmacist [facility] [phone] 10/16/2020 10:54 AM

## 2020-10-17 ENCOUNTER — Other Ambulatory Visit: Payer: Self-pay

## 2020-10-17 ENCOUNTER — Ambulatory Visit: Payer: 59 | Admitting: Rehabilitation

## 2020-10-17 ENCOUNTER — Encounter: Payer: Self-pay | Admitting: Rehabilitation

## 2020-10-17 DIAGNOSIS — M25612 Stiffness of left shoulder, not elsewhere classified: Secondary | ICD-10-CM

## 2020-10-17 DIAGNOSIS — Z483 Aftercare following surgery for neoplasm: Secondary | ICD-10-CM

## 2020-10-17 DIAGNOSIS — M79602 Pain in left arm: Secondary | ICD-10-CM

## 2020-10-17 DIAGNOSIS — Z510 Encounter for antineoplastic radiation therapy: Secondary | ICD-10-CM | POA: Diagnosis not present

## 2020-10-17 DIAGNOSIS — C50112 Malignant neoplasm of central portion of left female breast: Secondary | ICD-10-CM

## 2020-10-17 DIAGNOSIS — R293 Abnormal posture: Secondary | ICD-10-CM

## 2020-10-17 NOTE — Therapy (Signed)
[place], [place], [address] Phone: [phone]   Fax:  [phone]  Physical Therapy Treatment  Patient Details  Name: Caitlyn Hendricks MRN: [id number] Date of Birth: 25-Jul-1980 Referring Provider (PT): Donne Hazel   Encounter Date: 10/17/2020   PT End of Session - 10/17/20 0858    Visit Number 3    Number of Visits 10    Date for PT Re-Evaluation 11/08/20    PT Start Time 0800    PT Stop Time 0855    PT Time Calculation (min) 55 min    Activity Tolerance Patient tolerated treatment well    Behavior During Therapy [facility] for tasks assessed/performed           Past Medical History:  Diagnosis Date  . Anemia   . Anxiety   . Arthritis   . Cancer (HCC)    Left Breast  . Depression   . GERD (gastroesophageal reflux disease)   . Headache   . Panic attack     Past Surgical History:  Procedure Laterality Date  . MASTECTOMY MODIFIED RADICAL Left 09/20/2020   Procedure: LEFT MODIFIED RADICAL MASTECTOMY;  Surgeon: [name], MD;  Location: [place];  Service: General;  Laterality: Left;  RNFA; PEC BLOCK;  . PORTACATH PLACEMENT Right 03/21/2020   Procedure: INSERTION PORT-A-CATH WITH ULTRASOUND GUIDANCE;  Surgeon: [name], MD;  Location: [place];  Service: General;  Laterality: Right;    There were no vitals filed for this visit.   Subjective Assessment - 10/17/20 0803    Subjective Swelling the same.  the arm is still uncomfortable. 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 No/denies              [facility] PT Assessment - 10/17/20 0001      AROM   Left Shoulder Flexion 117 Degrees   arm pull   Left Shoulder ABduction 98 Degrees    cord pull                        [facility] Adult PT Treatment/Exercise - 10/17/20 0001      Manual Therapy   Manual Therapy Manual Lymphatic Drainage (MLD);Edema management    Edema Management encouraged pt to continue with compression as tolerated and use of small pillow    Myofascial Release to cording in LUE from forearm to axilla using cross hand technique -    Manual Lymphatic Drainage (MLD) post cording work to attempt to decrease soreness and work on lateral trunk edema: short neck, sternal nodes, lt axillary and inguinal nodes then lateral trunk towards inguinal nodes and then Lt UE from proximal to distal and then reversing steps    Passive ROM to Lt shoulder into flexion, abduction, and ER                       PT Long Term Goals - 10/11/20 1529      PT LONG TERM GOAL #1   Title Pt will return to baseline shoulder ROM measurements post op to allow pt to return to PLOF.    Time 8    Period Months    Status On-going  PT LONG TERM GOAL #2   Title Pt will demonstrate 170 degrees of L shoulder flexion to allow her to reach overhead.    Baseline 113    Time 4    Period Weeks    Status New    Target Date 11/08/20      PT LONG TERM GOAL #3   Title Pt will demonstrate 170 degrees of L shoulder abduction to allow her to reach out to the side.    Baseline 96    Time 4    Period Weeks    Status New    Target Date 11/08/20      PT LONG TERM GOAL #4   Title Pt will report a 75% improvement in L scapular pain to allow improved comfort.    Time 4    Period Weeks    Status New    Target Date 11/08/20      PT LONG TERM GOAL #5   Title Pt will report a 75% improvement in L arm pain due to cording to allow improved comfort and mobility.    Time 4    Period Weeks    Status New    Target Date 11/08/20      Additional Long Term Goals   Additional Long Term Goals Yes      PT LONG TERM GOAL #6   Title Pt will be independent in a home exercise  program for continued strengthening and stretching.    Time 4    Period Weeks    Status New    Target Date 11/08/20                 Plan - 10/17/20 0858    Clinical Impression Statement Pt tolerated treatment well with focus on pain and cording in the UE as well as lateral trunk edema.  Pt with no visible cords but palpable mainly in the axilla today improved from last session.  Pt has simulation tomorrow for radiation.    PT Frequency 2x / week    PT Duration 4 weeks    PT Treatment/Interventions ADLs/Self Care Home Management;Therapeutic exercise;Patient/family education;Therapeutic activities;Manual techniques;Manual lymph drainage;Compression bandaging;Scar mobilization;Passive range of motion;Taping;Vasopneumatic Device;Orthotic Fit/Training    PT Next Visit Plan make sure [facility] is scheduled. Consider / discuss sleeve due to ALND and cording?  PROM to L shoulder, MFR to cording, pulleys, ball, STM to L scapular musculature, MLD to L chest/abdomen/trunk    PT Home Exercise Plan post op breast exercises    Consulted and Agree with Plan of Care Patient           Patient will benefit from skilled therapeutic intervention in order to improve the following deficits and impairments:     Visit Diagnosis: Stiffness of left shoulder, not elsewhere classified  Aftercare following surgery for neoplasm  Pain in left arm  Malignant neoplasm of central portion of left female breast, unspecified estrogen receptor status ([place])  Abnormal posture     Problem List Patient Active Problem List   Diagnosis Date Noted  . S/P mastectomy, left 09/20/2020  . Genetic testing 08/03/2020  . Port-A-Cath in place 05/31/2020  . Cancer of central portion of left female breast ([place]) 03/10/2020    Stark Bray 10/17/2020, 9:01 AM  [place], [place], [address] Phone: [phone]   Fax:  [phone]  Name: Fleur Audino MRN: [id number] Date of Birth: 08-25-1980

## 2020-10-18 ENCOUNTER — Other Ambulatory Visit: Payer: Self-pay | Admitting: Hematology

## 2020-10-18 ENCOUNTER — Ambulatory Visit
Admission: RE | Admit: 2020-10-18 | Discharge: 2020-10-18 | Disposition: A | Discharge status: Home / Self Care | Payer: 59 | Source: Ambulatory Visit | Attending: Radiation Oncology | Admitting: Radiation Oncology

## 2020-10-18 ENCOUNTER — Inpatient Hospital Stay: Payer: 59 | Attending: Hematology | Admitting: Hematology

## 2020-10-18 ENCOUNTER — Encounter: Payer: Self-pay | Admitting: Radiation Oncology

## 2020-10-18 ENCOUNTER — Encounter: Payer: Self-pay | Admitting: General Practice

## 2020-10-18 ENCOUNTER — Encounter: Payer: Self-pay | Admitting: Hematology

## 2020-10-18 VITALS — BP 116/81 | HR 95 | Temp 97.8°F | Resp 20 | Ht 64.0 in | Wt 210.0 lb

## 2020-10-18 DIAGNOSIS — Z79899 Other long term (current) drug therapy: Secondary | ICD-10-CM | POA: Insufficient documentation

## 2020-10-18 DIAGNOSIS — Z923 Personal history of irradiation: Secondary | ICD-10-CM | POA: Insufficient documentation

## 2020-10-18 DIAGNOSIS — Z9012 Acquired absence of left breast and nipple: Secondary | ICD-10-CM | POA: Insufficient documentation

## 2020-10-18 DIAGNOSIS — M129 Arthropathy, unspecified: Secondary | ICD-10-CM | POA: Insufficient documentation

## 2020-10-18 DIAGNOSIS — K219 Gastro-esophageal reflux disease without esophagitis: Secondary | ICD-10-CM | POA: Insufficient documentation

## 2020-10-18 DIAGNOSIS — Z171 Estrogen receptor negative status [ER-]: Secondary | ICD-10-CM | POA: Diagnosis not present

## 2020-10-18 DIAGNOSIS — C773 Secondary and unspecified malignant neoplasm of axilla and upper limb lymph nodes: Secondary | ICD-10-CM | POA: Insufficient documentation

## 2020-10-18 DIAGNOSIS — C50112 Malignant neoplasm of central portion of left female breast: Secondary | ICD-10-CM

## 2020-10-18 DIAGNOSIS — Z510 Encounter for antineoplastic radiation therapy: Secondary | ICD-10-CM | POA: Insufficient documentation

## 2020-10-18 DIAGNOSIS — D649 Anemia, unspecified: Secondary | ICD-10-CM | POA: Insufficient documentation

## 2020-10-18 DIAGNOSIS — F418 Other specified anxiety disorders: Secondary | ICD-10-CM | POA: Insufficient documentation

## 2020-10-18 DIAGNOSIS — R21 Rash and other nonspecific skin eruption: Secondary | ICD-10-CM | POA: Insufficient documentation

## 2020-10-18 DIAGNOSIS — R232 Flushing: Secondary | ICD-10-CM | POA: Insufficient documentation

## 2020-10-18 DIAGNOSIS — M4317 Spondylolisthesis, lumbosacral region: Secondary | ICD-10-CM | POA: Insufficient documentation

## 2020-10-18 DIAGNOSIS — Z8616 Personal history of COVID-19: Secondary | ICD-10-CM | POA: Insufficient documentation

## 2020-10-18 DIAGNOSIS — M545 Low back pain, unspecified: Secondary | ICD-10-CM | POA: Insufficient documentation

## 2020-10-18 DIAGNOSIS — Z7952 Long term (current) use of systemic steroids: Secondary | ICD-10-CM | POA: Insufficient documentation

## 2020-10-18 MED ORDER — DOXYCYCLINE HYCLATE 100 MG PO TABS: 100.0000 mg | ORAL_TABLET | Freq: Two times a day (BID) | ORAL | 0 refills | Status: DC

## 2020-10-18 NOTE — Progress Notes (Signed)
[place] CSW Progress Notes  Message from [place] - patient wants information on Advance Directives.  Called patient, left VM w details on upcoming [place].  Encouraged her to call us to schedule directly.  Edwyna Shell, LCSW Clinical Social Worker Phone:  [phone]

## 2020-10-18 NOTE — Progress Notes (Signed)
Radiation Oncology         [phone] ________________________________  Name: Caitlyn Hendricks MRN: [id number]  Date: 10/18/2020  DOB: [birth year]  Re-Evaluation Note  CC: [name], MD  Truitt Merle, MD    ICD-10-CM   1. Malignant neoplasm of central portion of left breast in female, estrogen receptor negative ([place])  C50.112 Ambulatory referral to Social Work   Z17.1     Diagnosis: Stage IIIB (cT2, cN1, cM0) Left Breast Multicentric, Invasive Ductal Carcinoma, ER- / PR- / Her2-, Grade 3 (inflammatory breast cancer)  Narrative:  The patient returns today to discuss radiation treatment options. She was seen in consultation on 08/23/2020, at which time it was recommended that she proceed with post-mastectomy radiation therapy.   She underwent a left breast mastectomy on 09/20/2020 under the care of Dr. Donne Hazel. Pathology from the procedure revealed no residual invasive carcinoma in the breast status post neoadjuvant treatment. However, metastatic carcinoma was found in 7/19 lymph nodes.  On review of systems, the patient reports a rash that has developed over the past several days involving the upper chest and lower neck region.  This is quite pruritic. She denies chills or fever and any other symptoms.  She has some discomfort along the surgical site but is not requiring any medication at this time.  She denies any swelling in her left arm or hand.  Her range of movement in the left arm and shoulder is improving quickly since her axillary dissection and mastectomy.   Allergies:  is allergic to medroxyprogesterone.  Meds: Current Outpatient Medications  Medication Sig Dispense Refill  . ALPRAZolam (XANAX) 1 MG tablet Take 1 mg by mouth 3 (three) times daily as needed for anxiety.     Marland Kitchen azithromycin (ZITHROMAX) 250 MG tablet Take as prescribed 6 each 0  . baclofen (LIORESAL) 10 MG tablet Take 1 tablet (10 mg total) by mouth 2 (two) times daily as needed for muscle spasms. 30 each 0   . capecitabine (XELODA) 500 MG tablet Take 3 tablets (1,500 mg total) by mouth 2 (two) times daily after a meal. Take only on days of radiation Monday through Friday. 90 tablet 1  . ciprofloxacin (CIPRO) 500 MG tablet Take 1 tablet (500 mg total) by mouth 2 (two) times daily. 10 tablet 0  . citalopram (CELEXA) 20 MG tablet Take 20 mg by mouth daily.    Marland Kitchen dexamethasone (DECADRON) 4 MG tablet Take 2 tablets by mouth daily starting the day after Carboplatin and Cytoxan x 3 days. Take with food. 30 tablet 1  . diphenhydrAMINE (BENADRYL) 25 mg capsule Take 25 mg by mouth every 6 (six) hours as needed for allergies.    . diphenhydrAMINE HCl (ZZZQUIL) 50 MG/30ML LIQD Take 30 mLs by mouth at bedtime as needed (sleep).    . fluconazole (DIFLUCAN) 150 MG tablet Take 1 tablet for a yeast infection if needed, may repeat in 3 days if needed Use if needed while on antibiotic. 2 tablet 0  . fluticasone (FLONASE) 50 MCG/ACT nasal spray Place 2 sprays into both nostrils daily as needed for allergies or rhinitis.    . furosemide (LASIX) 20 MG tablet Take 1 tablet (20 mg total) by mouth daily as needed for edema. 10 tablet 0  . gabapentin (NEURONTIN) 100 MG capsule Take 1 capsule (100 mg total) by mouth 3 (three) times daily. May increase to 348m three times daily as need in a few weeks if tolerates well (Patient taking differently: Take 100 mg  by mouth 3 (three) times daily as needed (hot flashes). May increase to 3104m three times daily as need in a few weeks if tolerates well) 90 capsule 0  . ibuprofen (ADVIL) 200 MG tablet Take 800 mg by mouth every 8 (eight) hours as needed for moderate pain.    .Marland Kitchenlidocaine-prilocaine (EMLA) cream Apply to affected area once (Patient taking differently: Apply 1 application topically as needed (port access). Apply to affected area once) 30 g 3  . magic mouthwash w/lidocaine SOLN Take 5 mLs by mouth 4 (four) times daily as needed for mouth pain. 480 mL 1  . methocarbamol (ROBAXIN)  500 MG tablet Take 1 tablet (500 mg total) by mouth every 6 (six) hours as needed for muscle spasms. 30 tablet 2  . methylPREDNISolone (MEDROL DOSEPAK) 4 MG TBPK tablet Take 1 tablet (4 mg total) by mouth taper from 4 doses each day to 1 dose and stop. 1 each 0  . metroNIDAZOLE (METROGEL) 0.75 % vaginal gel Place 1 Applicatorful vaginally 2 (two) times daily. 70 g 1  . omeprazole (PRILOSEC) 20 MG capsule TAKE 1 CAPSULE (20 MG TOTAL) BY MOUTH 2 (TWO) TIMES DAILY BEFORE A MEAL. (Patient taking differently: Take 20 mg by mouth 2 (two) times daily as needed (heartburn).) 180 capsule 1  . ondansetron (ZOFRAN) 8 MG tablet Take 1 tablet (8 mg total) by mouth 2 (two) times daily as needed. Start on the third day after chemotherapy. 30 tablet 2  . prochlorperazine (COMPAZINE) 10 MG tablet Take 1 tablet (10 mg total) by mouth every 6 (six) hours as needed (Nausea or vomiting). 30 tablet 2  . traMADol (ULTRAM) 50 MG tablet Take 1 tablet (50 mg total) by mouth daily as needed for severe pain (headache). 30 tablet 0  . triamcinolone ointment (KENALOG) 0.5 % Apply 1 application topically 2 (two) times daily. 30 g 2  . zolpidem (AMBIEN) 10 MG tablet Take 10 mg by mouth at bedtime as needed for sleep.    .Marland Kitchendoxycycline (VIBRA-TABS) 100 MG tablet Take 1 tablet (100 mg total) by mouth 2 (two) times daily. 14 tablet 0   No current facility-administered medications for this encounter.   Facility-Administered Medications Ordered in Other Encounters  Medication Dose Route Frequency Provider Last Rate Last Admin  . sodium chloride flush (NS) 0.9 % injection 10 mL  10 mL Intravenous PRN BAlla Feeling NP   10 mL at 03/28/20 1104    Physical Findings: The patient is in no acute distress. Patient is alert and oriented.  height is 5' 4"  (1.626 m) and weight is 210 lb (95.3 kg). Her temperature is 97.8 F (36.6 C). Her blood pressure is 116/81 and her pulse is 95. Her respiration is 20 and oxygen saturation is 100%.    Lungs are clear to auscultation bilaterally. Heart has regular rate and rhythm. No palpable cervical, supraclavicular, or axillary adenopathy. Abdomen soft, non-tender, normal bowel sounds. Right breast: no palpable mass, nipple discharge or bleeding. Left chest wall area shows a mastectomy scar which is healed well.  No signs of drainage or infection.  Patient has 2 prior drain sites which shows some erythema but no drainage or signs of infection in this area.  This is inferior to her mastectomy scar.  Patient has a diffuse rash along the upper chest and lower neck region most consistent with infection possibly allergic although patient has not changed clothing or detergents recently.  Possibly recurrent tumor given her diagnosis of inflammatory breast  cancer.  Lab Findings: Lab Results  Component Value Date   WBC 7.2 10/04/2020   HGB 11.5 (L) 10/04/2020   HCT 35.4 (L) 10/04/2020   MCV 92.9 10/04/2020   PLT 384 10/04/2020    Radiographic Findings: No results found.  Impression: Stage IIIB (cT2, cN1, cM0) Left Breast Multicentric, Invasive Ductal Carcinoma, ER- / PR- / Her2-, Grade 3 (inflammatory breast cancer)  Patient would be a good candidate for postmastectomy radiation therapy directed at the left chest wall and axillary region.  I discussed the general course of treatment side effects and potential toxicities of radiation therapy in this situation with the patient and her mother.  She appears to understand and wishes to proceed with planned course of treatment.  Dr. [name] graciously agreed to see the patient given the rash along the upper chest region. It is our interpretation this is likely infectious and the patient will be placed on doxycycline.  If this does not clear with antibiotics then will need to consider biopsy to rule out recurrence.  Plan:  Patient is scheduled for CT simulation later today.  She will begin her postmastectomy radiation therapy on April 25 concomitant with  Xeloda as a radiation sensitizer.  She also will be receiving immunotherapy.  Anticipate 6 weeks of postmastectomy radiation therapy.  Will use cardiac sparing techniques if necessary.  In addition the patient has been having some pain in the low back pelvis region and urgent MRI has been ordered by Dr. Morey Hummingbird.  If this were to show metastatic disease then we would have to re- think whether to proceed with postmastectomy radiation therapy.  Total time spent in this encounter was 35 minutes which included reviewing the patient's most recent mastectomy, pathology report, physical examination, and documentation.  -----------------------------------  Blair Promise, PhD, MD  This document serves as a record of services personally performed by Gery Pray, MD. It was created on his behalf by Clerance Lav, a trained medical scribe. The creation of this record is based on the scribe's personal observations and the provider's statements to them. This document has been checked and approved by the attending provider.

## 2020-10-18 NOTE — Telephone Encounter (Signed)
Oral Chemotherapy Pharmacist Encounter   Spoke with patient today to follow up regarding patient's oral chemotherapy medication: Xeloda (capecitabine)  Confirmed with patient that medication has been set up for shipment from [facility] and will be delivered to patient's home on 10/19/20.  Patient knows that she will not start Xeloda until her first day of radiation which will be 10/30/20. Will touch base with patient next week to review medication details and initial counseling for medication.   Leron Croak, PharmD, BCPS Hematology/Oncology Clinical Pharmacist [facility] [phone] 10/18/2020 4:40 PM

## 2020-10-18 NOTE — Progress Notes (Signed)
Caitlyn Hendricks   [phone] Fax:([phone]   Clinic Follow up Note   Patient Care Team: Caitlyn Skeens., MD as PCP - General (Family Medicine) Caitlyn Kaufmann, RN as Oncology Nurse Navigator Caitlyn Germany, RN as Oncology Nurse Navigator Caitlyn Bookbinder, MD as Consulting Physician (General Surgery) Caitlyn Merle, MD as Consulting Physician (Hematology) Caitlyn Pray, MD as Consulting Physician (Radiation Oncology) 10/18/2020  CHIEF COMPLAINT:  Skin rash and low back pain   SUMMARY OF ONCOLOGIC HISTORY: Oncology History Overview Note  Cancer Staging Cancer of central portion of left female breast [facility]) Staging form: Breast, AJCC 8th Edition - Clinical: No stage assigned - Unsigned    Cancer of central portion of left female breast ([name])  02/23/2020 Mammogram   Diagnostic Mammogram 02/23/20  IMPRESSION The 2x1x2.6cm irregular mass in teh left breast at 12:00 posiiton middle depth is highly suspicious of malignancy. An [place] is recommended for further evaluation and biopsy planning purposes.   02/23/2020 Breast US   [name] 02/23/20  IMPRESSION 2 adjacent spiculated masses in the left brast at 12:00 position 3 cm from the nipple (2.1x0.9x1.1cm and 1.1x1.4x0.5cm) is suggestive of malignancy.   Multiple abnormal left subpectoral and left axillary nodes measuring 3.3x2.1 cm concerning for metastatic adenopathy.    Left breast skin thickening and edema may be secondary congestive edema due to extensive axillary adenopathy.    03/08/2020 Initial Biopsy   Diagnosis 1.Breast, left, needle core biopsy, 12:00 position, 3cmfn -INVASIVE DUCTAL CARCINOMA -SEE COMMENT  2. Lymph node, needle/core biopsy, left axilla -METASTATIC CARCINOMA INVOLVING A LYMPH NODE  -LYMPHOVASCULAR SPACE INVASION PRESENT   Microscopic Comment  1.Based on the biopsy the carcinoma appears Nottingham Grade 3 or 3 and measures 1 cm in the greatest linear extent.    03/08/2020  Receptors her2   ER- Negative 0% PR - Negative 0% HER2 - Negative  KI 67 - 80%    03/08/2020 Cancer Staging   Staging form: Breast, AJCC 8th Edition - Clinical stage from 03/08/2020: Stage IIIB (cT2, cN1, cM0, G3, ER-, PR-, HER2-) - Signed by Caitlyn Merle, MD on 03/10/2020   03/10/2020 Initial Diagnosis   Cancer of central portion of left female breast ([name])   03/16/2020 Breast MRI   IMPRESSION: 1. 8.1 x 7.8 x 6.6 cm biopsy proven invasive ductal carcinoma in the central right breast, involving 3 quadrants. 2. 3.0 x 1.7 x 1.1 cm satellite mass more inferiorly in lower inner quadrant of the left breast, compatible with additional malignancy. 3. Metastatic level 1 and level 2 left axillary lymph nodes. 4. No evidence of malignancy on the right.   03/17/2020 Imaging   IMPRESSION: CT CAP w contrast  1. Diffuse skin thickening in the left breast with left axillary and subpectoral lymphadenopathy, as well as a mildly enlarged left supraclavicular lymph node, which likely represents metastatic lymphadenopathy. No other definite extra nodal metastatic disease noted elsewhere in the chest, abdomen or pelvis. 2. Large mass in the central anatomic pelvis which is of uncertain origin, potentially a large exophytic fibroid or a large solid mass arising from the right ovary. Further evaluation with pelvic ultrasound is strongly recommended.   03/21/2020 Imaging   Bone Scan  IMPRESSION: Apparent arthropathy at L5. No bony metastatic disease is demonstrable on this study. Scattered foci of abnormal uptake in a pelvic mass is of uncertain etiology given absence of calcification in this mass by CT. This mass compresses the urinary bladder. It is possible that some  of the increased uptake in this area actually represents physiologic uptake within the bladder.   Kidneys noted in flank positions bilaterally.     03/22/2020 - 08/16/2020 Neo-Adjuvant Chemotherapy   Neoadjuvant Adriamycin and Cytoxan q2weeks  for 4 cycles starting 03/22/20-05/03/20 followed by weekly Taxol and Carboplatin for 12 weeks starting 05/17/20-08/16/20   03/24/2020 Imaging   US Pelvis  IMPRESSION: 1. Large pedunculated lesion directly contiguous with the uterine most suggestive of a large subserosal fibroid which measures up to 8.2 cm. 2. Additional 1 cm probable intramural fibroid in the right anterior uterine body. 3. No other acute or worrisome pelvic abnormality.   03/29/2020 Genetic Testing   Negative genetic testing on the common hereditary cancer panel.  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  The Common Hereditary Gene Panel offered by [facility] includes sequencing and/or deletion duplication testing of the following 47 genes: APC, ATM, AXIN2, BARD1, BMPR1A, BRCA1, BRCA2, BRIP1, CDH1, CDK4, CDKN2A (p14ARF), CDKN2A (p16INK4a), CHEK2, CTNNA1, DICER1, EPCAM (Deletion/duplication testing only), GREM1 (promoter region deletion/duplication testing only), KIT, MEN1, MLH1, MSH2, MSH3, MSH6, MUTYH, NBN, NF1, NHTL1, PALB2, PDGFRA, PMS2, POLD1, POLE, PTEN, RAD50, RAD51C, RAD51D, SDHB, SDHC, SDHD, SMAD4, SMARCA4. STK11, TP53, TSC1, TSC2, and VHL.  The following genes were evaluated for sequence changes only: SDHA and HOXB13 c.251G>A variant only. The report date is 03/29/2020   04/28/2020 -  Antibody Plan   Added Keytruda q3weeks starting 04/28/20 to complete 1 year of treatment    08/17/2020 Breast MRI   IMPRESSION: 1. The biopsy proven malignancy in the left breast and the adjacent satellite lesion have resolved in the interval. No abnormalities in these locations today. 2. No MRI evidence of malignancy in the right breast. 3. No adenopathy identified today   09/20/2020 Surgery   LEFT MODIFIED RADICAL MASTECTOMY by Dr Caitlyn Hendricks   09/20/2020 Pathology Results   FINAL MICROSCOPIC DIAGNOSIS:   A. BREAST, LEFT, MODIFIED RADICAL MASTECTOMY:  - No residual invasive carcinoma in breast status post neoadjuvant  treatment.  -  Metastatic carcinoma in (2) of (14) lymph nodes.  - Biopsy sites (one in breast, one in one of the positive lymph nodes).  - See oncology table.   B. ADDITIONAL AXILLARY CONTENTS, LEFT, DISSECTION:  - Metastatic carcinoma in (5) of (5) lymph nodes.    09/20/2020 Cancer Staging   Staging form: Breast, AJCC 8th Edition - Pathologic stage from 09/20/2020: No Stage Recommended (ypT0, pN2a, cM0, G3, ER-, PR-, HER2-) - Signed by Gardenia Phlegm, NP on 10/04/2020 Stage prefix: Post-therapy Histologic grading system: 3 grade system     CURRENT THERAPY: pending adjuvant breast radiation and Xeloda, Keytruda every 3 weeks  INTERVAL HISTORY: Patient came in for CT simulation today.  I was called by her radiation oncologist Dr. Caryl Comes not to see patient due to the concern of her skin rash.  She reports the rash started about a few weeks ago, in her front upper chest, it itches, no discharge or pain.  She has not using any medicine for that.  She also noticed new pain in the low back, tailbone area, which radiates to her right hip and thigh, and the limits some of her activity but she is able to walk without difficulty.  She also noticed some pain in bilateral heels, no neuropathy.  Appetite and energy has been normal, no weight loss.  All other systems were reviewed with the patient and are negative.  MEDICAL HISTORY:  Past Medical History:  Diagnosis Date  . Anemia   .  Anxiety   . Arthritis   . Cancer (HCC)    Left Breast  . Depression   . GERD (gastroesophageal reflux disease)   . Headache   . Panic attack     SURGICAL HISTORY: Past Surgical History:  Procedure Laterality Date  . MASTECTOMY MODIFIED RADICAL Left 09/20/2020   Procedure: LEFT MODIFIED RADICAL MASTECTOMY;  Surgeon: Caitlyn Bookbinder, MD;  Location: [place];  Service: General;  Laterality: Left;  RNFA; PEC BLOCK;  . PORTACATH PLACEMENT Right 03/21/2020   Procedure: INSERTION PORT-A-CATH WITH ULTRASOUND GUIDANCE;   Surgeon: Caitlyn Bookbinder, MD;  Location: [place];  Service: General;  Laterality: Right;    I have reviewed the social history and family history with the patient and they are unchanged from previous note.  ALLERGIES:  is allergic to medroxyprogesterone.  MEDICATIONS:  Current Outpatient Medications  Medication Sig Dispense Refill  . ALPRAZolam (XANAX) 1 MG tablet Take 1 mg by mouth 3 (three) times daily as needed for anxiety.     Marland Kitchen azithromycin (ZITHROMAX) 250 MG tablet Take as prescribed 6 each 0  . baclofen (LIORESAL) 10 MG tablet Take 1 tablet (10 mg total) by mouth 2 (two) times daily as needed for muscle spasms. 30 each 0  . capecitabine (XELODA) 500 MG tablet Take 3 tablets (1,500 mg total) by mouth 2 (two) times daily after a meal. Take only on days of radiation Monday through Friday. 90 tablet 1  . ciprofloxacin (CIPRO) 500 MG tablet Take 1 tablet (500 mg total) by mouth 2 (two) times daily. 10 tablet 0  . citalopram (CELEXA) 20 MG tablet Take 20 mg by mouth daily.    Marland Kitchen dexamethasone (DECADRON) 4 MG tablet Take 2 tablets by mouth daily starting the day after Carboplatin and Cytoxan x 3 days. Take with food. 30 tablet 1  . diphenhydrAMINE (BENADRYL) 25 mg capsule Take 25 mg by mouth every 6 (six) hours as needed for allergies.    . diphenhydrAMINE HCl (ZZZQUIL) 50 MG/30ML LIQD Take 30 mLs by mouth at bedtime as needed (sleep).    Marland Kitchen doxycycline (VIBRA-TABS) 100 MG tablet Take 1 tablet (100 mg total) by mouth 2 (two) times daily. 14 tablet 0  . fluconazole (DIFLUCAN) 150 MG tablet Take 1 tablet for a yeast infection if needed, may repeat in 3 days if needed Use if needed while on antibiotic. 2 tablet 0  . fluticasone (FLONASE) 50 MCG/ACT nasal spray Place 2 sprays into both nostrils daily as needed for allergies or rhinitis.    . furosemide (LASIX) 20 MG tablet Take 1 tablet (20 mg total) by mouth daily as needed for edema. 10 tablet 0  . gabapentin (NEURONTIN) 100  MG capsule Take 1 capsule (100 mg total) by mouth 3 (three) times daily. May increase to 340m three times daily as need in a few weeks if tolerates well (Patient taking differently: Take 100 mg by mouth 3 (three) times daily as needed (hot flashes). May increase to 3038mthree times daily as need in a few weeks if tolerates well) 90 capsule 0  . ibuprofen (ADVIL) 200 MG tablet Take 800 mg by mouth every 8 (eight) hours as needed for moderate pain.    . Marland Kitchenidocaine-prilocaine (EMLA) cream Apply to affected area once (Patient taking differently: Apply 1 application topically as needed (port access). Apply to affected area once) 30 g 3  . magic mouthwash w/lidocaine SOLN Take 5 mLs by mouth 4 (four) times daily as needed for mouth pain.  480 mL 1  . methocarbamol (ROBAXIN) 500 MG tablet Take 1 tablet (500 mg total) by mouth every 6 (six) hours as needed for muscle spasms. 30 tablet 2  . methylPREDNISolone (MEDROL DOSEPAK) 4 MG TBPK tablet Take 1 tablet (4 mg total) by mouth taper from 4 doses each day to 1 dose and stop. 1 each 0  . metroNIDAZOLE (METROGEL) 0.75 % vaginal gel Place 1 Applicatorful vaginally 2 (two) times daily. 70 g 1  . omeprazole (PRILOSEC) 20 MG capsule TAKE 1 CAPSULE (20 MG TOTAL) BY MOUTH 2 (TWO) TIMES DAILY BEFORE A MEAL. (Patient taking differently: Take 20 mg by mouth 2 (two) times daily as needed (heartburn).) 180 capsule 1  . ondansetron (ZOFRAN) 8 MG tablet Take 1 tablet (8 mg total) by mouth 2 (two) times daily as needed. Start on the third day after chemotherapy. 30 tablet 2  . prochlorperazine (COMPAZINE) 10 MG tablet Take 1 tablet (10 mg total) by mouth every 6 (six) hours as needed (Nausea or vomiting). 30 tablet 2  . traMADol (ULTRAM) 50 MG tablet Take 1 tablet (50 mg total) by mouth daily as needed for severe pain (headache). 30 tablet 0  . triamcinolone ointment (KENALOG) 0.5 % Apply 1 application topically 2 (two) times daily. 30 g 2  . zolpidem (AMBIEN) 10 MG tablet Take  10 mg by mouth at bedtime as needed for sleep.     No current facility-administered medications for this visit.   Facility-Administered Medications Ordered in Other Visits  Medication Dose Route Frequency Provider Last Rate Last Admin  . sodium chloride flush (NS) 0.9 % injection 10 mL  10 mL Intravenous PRN Alla Feeling, NP   10 mL at 03/28/20 1104    PHYSICAL EXAMINATION: ECOG PERFORMANCE STATUS: 1 - Symptomatic but completely ambulatory GENERAL:alert, no distress and comfortable SKIN: Diffuse papular skin rash in the upper front chest above her breasts, a few has whitehead.  Faint skin rash on her face also.  EYES: normal, Conjunctiva are pink and non-injected, sclera clear OROPHARYNX:no exudate, no erythema and lips, buccal mucosa, and tongue normal  NECK: supple, thyroid normal size, non-tender, without nodularity LYMPH:  no palpable lymphadenopathy in the cervical, axillary or inguinal LUNGS: clear to auscultation and percussion with normal breathing effort HEART: regular rate & rhythm and no murmurs and no lower extremity edema ABDOMEN:abdomen soft, non-tender and normal bowel sounds Musculoskeletal:no cyanosis of digits and no clubbing  NEURO: alert & oriented x 3 with fluent speech, no focal motor/sensory deficits Breast exam: Status post left-sided mastectomy, surgical incision has healed well, no discharge, surrounding skin erythema or nodules.  LABORATORY DATA:  I have reviewed the data as listed CBC Latest Ref Rng & Units 10/04/2020 09/07/2020 08/16/2020  WBC 4.0 - 10.5 K/uL 7.2 4.2 8.0  Hemoglobin 12.0 - 15.0 g/dL 11.5(L) 10.9(L) 10.0(L)  Hematocrit 36.0 - 46.0 % 35.4(L) 33.3(L) 31.4(L)  Platelets 150 - 400 K/uL 384 237 255     CMP Latest Ref Rng & Units 10/04/2020 09/07/2020 08/16/2020  Glucose 70 - 99 mg/dL 136(H) 91 92  BUN 6 - 20 mg/dL 16 14 19   Creatinine 0.44 - 1.00 mg/dL 0.83 0.77 0.75  Sodium 135 - 145 mmol/L 141 139 138  Potassium 3.5 - 5.1 mmol/L 3.8 4.4 4.1   Chloride 98 - 111 mmol/L 102 105 105  CO2 22 - 32 mmol/L 25 25 25   Calcium 8.9 - 10.3 mg/dL 9.4 9.3 9.0  Total Protein 6.5 - 8.1 g/dL 8.0  7.2 6.9  Total Bilirubin 0.3 - 1.2 mg/dL 0.3 0.2(L) 0.3  Alkaline Phos 38 - 126 U/L 184(H) 137(H) 152(H)  AST 15 - 41 U/L 26 23 19   ALT 0 - 44 U/L 31 24 20       RADIOGRAPHIC STUDIES: I have personally reviewed the radiological images as listed and agreed with the findings in the report. No results found.   ASSESSMENT & PLAN:  Caitlyn Hendricks is a 40 y.o. 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 Skin rashes on front upper chest  3. Low back pain  Plan -History of skin rashes are possible infection versus allergy reaction, will call in doxycycline 100 mg twice daily for her today.  I also encouraged her to keep it clean, and use topical steroids as needed for itching. -If rash no improvement with antibiotics, will consider biopsy to rule out skin metastasis from her breast cancer. -Due to her new onset low back pain, I will order lumbar MRI with and without contrast next week for further evaluation. -She is scheduled to see me next Friday -I spoke with Dr. Sondra Come today  Orders Placed This Encounter  Procedures  . MR Lumbar Spine W Wo Contrast    Standing Status:   Future    Standing Expiration Date:   10/18/2021    Order Specific Question:   If indicated for the ordered procedure, I authorize the administration of contrast media per Radiology protocol    Answer:   Yes    Order Specific Question:   What is the patient's sedation requirement?    Answer:   No Sedation    Order Specific Question:   Does the patient have a pacemaker or implanted devices?    Answer:   No    Order Specific Question:   Use SRS Protocol?    Answer:   No    Order Specific Question:   Preferred imaging location?    Answer:   [facility] (table limit  - 550 lbs)   All questions were answered. The patient knows to call the clinic with any problems, questions or concerns. No barriers to learning was detected. I spent 25 minutes counseling the patient face to face. The total time spent in the appointment was 30 minutes and more than 50% was on counseling and review of test results     Caitlyn Merle, MD 10/18/20

## 2020-10-18 NOTE — Progress Notes (Signed)
See doctor's note for nursing evaluation.

## 2020-10-19 ENCOUNTER — Ambulatory Visit: Payer: 59 | Admitting: Rehabilitation

## 2020-10-19 ENCOUNTER — Other Ambulatory Visit: Payer: Self-pay

## 2020-10-19 ENCOUNTER — Telehealth: Payer: Self-pay

## 2020-10-19 ENCOUNTER — Encounter: Payer: Self-pay | Admitting: General Practice

## 2020-10-19 ENCOUNTER — Other Ambulatory Visit: Payer: Self-pay | Admitting: Hematology

## 2020-10-19 ENCOUNTER — Encounter: Payer: Self-pay | Admitting: Rehabilitation

## 2020-10-19 DIAGNOSIS — Z483 Aftercare following surgery for neoplasm: Secondary | ICD-10-CM

## 2020-10-19 DIAGNOSIS — C50112 Malignant neoplasm of central portion of left female breast: Secondary | ICD-10-CM

## 2020-10-19 DIAGNOSIS — Z510 Encounter for antineoplastic radiation therapy: Secondary | ICD-10-CM | POA: Diagnosis not present

## 2020-10-19 DIAGNOSIS — R293 Abnormal posture: Secondary | ICD-10-CM

## 2020-10-19 DIAGNOSIS — M79602 Pain in left arm: Secondary | ICD-10-CM

## 2020-10-19 DIAGNOSIS — M25612 Stiffness of left shoulder, not elsewhere classified: Secondary | ICD-10-CM

## 2020-10-19 DIAGNOSIS — Z171 Estrogen receptor negative status [ER-]: Secondary | ICD-10-CM

## 2020-10-19 NOTE — Therapy (Signed)
[place], [place], [address] Phone: [phone]   Fax:  [phone]  Physical Therapy Treatment  Patient Details  Name: Caitlyn Hendricks MRN: [id number] Date of Birth: 1981-05-13 Referring Provider (PT): Donne Hazel   Encounter Date: 10/19/2020   PT End of Session - 10/19/20 0916    Visit Number 4    Number of Visits 10    Date for PT Re-Evaluation 11/08/20    PT Start Time 0800    PT Stop Time 0902    PT Time Calculation (min) 62 min    Activity Tolerance Patient tolerated treatment well    Behavior During Therapy [facility] for tasks assessed/performed           Past Medical History:  Diagnosis Date  . Anemia   . Anxiety   . Arthritis   . Cancer (HCC)    Left Breast  . Depression   . GERD (gastroesophageal reflux disease)   . Headache   . Panic attack     Past Surgical History:  Procedure Laterality Date  . MASTECTOMY MODIFIED RADICAL Left 09/20/2020   Procedure: LEFT MODIFIED RADICAL MASTECTOMY;  Surgeon: [name], MD;  Location: [place];  Service: General;  Laterality: Left;  RNFA; PEC BLOCK;  . PORTACATH PLACEMENT Right 03/21/2020   Procedure: INSERTION PORT-A-CATH WITH ULTRASOUND GUIDANCE;  Surgeon: [name], MD;  Location: [place];  Service: General;  Laterality: Right;    There were no vitals filed for this visit.   Subjective Assessment - 10/19/20 0803    Subjective simulation will be doable. I got doxycyline for the rash and if it is not gone they will biopsy it and I will be getting an MRI for the Rt bone pain.    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 Yes    Pain Score 3     Pain Location Arm    Pain Orientation Left    Pain Descriptors /  Indicators Sore    Pain Type Chronic pain    Pain Onset More than a month ago    Pain Frequency Intermittent                             [facility] Adult PT Treatment/Exercise - 10/19/20 0001      Exercises   Exercises Shoulder      Shoulder Exercises: Pulleys   Flexion 2 minutes    Flexion Limitations with initial cueing    ABduction 2 minutes    ABduction Limitations with initial cueing      Shoulder Exercises: Therapy Ball   Flexion 5 reps;Both      Manual Therapy   Edema Management Pt has 3 compression sleeves already and was instructed to start wearing one of these.  Showed pt wear ease shirt and cami vs [employer] compression tshirts which pt will look into    Myofascial Release to cording in LUE from forearm to axilla using cross hand technique -    Manual Lymphatic Drainage (MLD) post cording work to attempt to decrease soreness and work on lateral trunk edema: short neck, sternal nodes, lt axillary and inguinal nodes then lateral trunk towards inguinal nodes and then Lt UE from proximal to  distal and then reversing steps. Then posterior interaxillary work in [place]    Passive ROM to Lt shoulder into flexion, abduction, and ER                       PT Long Term Goals - 10/11/20 1529      PT LONG TERM GOAL #1   Title Pt will return to baseline shoulder ROM measurements post op to allow pt to return to PLOF.    Time 8    Period Months    Status On-going      PT LONG TERM GOAL #2   Title Pt will demonstrate 170 degrees of L shoulder flexion to allow her to reach overhead.    Baseline 113    Time 4    Period Weeks    Status New    Target Date 11/08/20      PT LONG TERM GOAL #3   Title Pt will demonstrate 170 degrees of L shoulder abduction to allow her to reach out to the side.    Baseline 96    Time 4    Period Weeks    Status New    Target Date 11/08/20      PT LONG TERM GOAL #4   Title Pt will report a 75% improvement in L  scapular pain to allow improved comfort.    Time 4    Period Weeks    Status New    Target Date 11/08/20      PT LONG TERM GOAL #5   Title Pt will report a 75% improvement in L arm pain due to cording to allow improved comfort and mobility.    Time 4    Period Weeks    Status New    Target Date 11/08/20      Additional Long Term Goals   Additional Long Term Goals Yes      PT LONG TERM GOAL #6   Title Pt will be independent in a home exercise program for continued strengthening and stretching.    Time 4    Period Weeks    Status New    Target Date 11/08/20                 Plan - 10/19/20 0916    Clinical Impression Statement Improved from visit 2 days ago.  Pt reported feeling some relief in the arm suddenly on tuesday which may have been beneficial to cords and movement.  Pt continues with left UE cording, decreased AROM/PROM, and edema in the left upper quadrant noted around scapula today.  Pt is going to try sleeve for pain relief due to cording and will investigate compression tshirts.    PT Frequency 2x / week    PT Duration 4 weeks    PT Treatment/Interventions ADLs/Self Care Home Management;Therapeutic exercise;Patient/family education;Therapeutic activities;Manual techniques;Manual lymph drainage;Compression bandaging;Scar mobilization;Passive range of motion;Taping;Vasopneumatic Device;Orthotic Fit/Training    PT Next Visit Plan make sure SOZO is scheduled. PROM to L shoulder, MFR to cording, pulleys, ball, STM to L scapular musculature, MLD to L chest/abdomen/trunk    PT Home Exercise Plan post op breast exercises, compression bra use, compression sleeve use    Consulted and Agree with Plan of Care Patient           Patient will benefit from skilled therapeutic intervention in order to improve the following deficits and impairments:  Pain,Postural dysfunction,Impaired UE functional use,Impaired flexibility,Increased fascial restricitons,Decreased  strength,Decreased range  of motion,Decreased scar mobility,Increased edema  Visit Diagnosis: Stiffness of left shoulder, not elsewhere classified  Aftercare following surgery for neoplasm  Pain in left arm  Malignant neoplasm of central portion of left female breast, unspecified estrogen receptor status ([place])  Abnormal posture     Problem List Patient Active Problem List   Diagnosis Date Noted  . S/P mastectomy, left 09/20/2020  . Genetic testing 08/03/2020  . Port-A-Cath in place 05/31/2020  . Cancer of central portion of left female breast ([place]) 03/10/2020    Stark Bray 10/19/2020, 9:20 AM  [place], [place], [address] Phone: [phone]   Fax:  [phone]  Name: Chelby Salata MRN: [id number] Date of Birth: 09/27/1980

## 2020-10-19 NOTE — Progress Notes (Signed)
[facility] Psychosocial Distress Screening Clinical Social Work  Clinical Social Work was referred by distress screening protocol.  The patient scored a 7 on the Psychosocial Distress Thermometer which indicates moderate distress. Clinical Social Worker contacted patient by phone to assess for distress and other psychosocial needs. Is worried about pain and swelling in her abdomen - she has left a VM for Dr Ernestina Penna VM.  Patient has anxiety  "I'm just really stressed and worried"  Has Social Security disability in place, has received two checks thus far.  Worries about bills for utilities, car payment, insurance.  She is finding managing anxiety difficult - thinks about her situation often.  She is open to considering a therapist to learn anxiety management techniques.     ONCBCN DISTRESS SCREENING 10/18/2020  Screening Type Initial Screening  Distress experienced in past week (1-10) 7  Emotional problem type Nervousness/Anxiety;Adjusting to illness;Boredom;Adjusting to appearance changes  Physical Problem type Pain;Skin dry/itchy  Physician notified of physical symptoms Yes  Referral to clinical social work Yes   Clinical Social Worker follow up needed: Yes.    If yes, follow up plan:  Meet w her in infusion to enroll in [facility] and discuss options for therapist under her [place], [place], [place] Worker Phone:  [phone]

## 2020-10-19 NOTE — Telephone Encounter (Signed)
Caitlyn Hendricks called stating she has a lump the size of her fist on the left side of her upper abdomen.  It is tender to touch.  She is also having pain in her hands and joints.  She is going to take ibuprofen for the discomfort. Please advise regarding abd.

## 2020-10-23 ENCOUNTER — Encounter: Payer: Self-pay | Admitting: *Deleted

## 2020-10-23 ENCOUNTER — Ambulatory Visit: Payer: 59 | Admitting: Physical Therapy

## 2020-10-23 NOTE — Telephone Encounter (Signed)
I called her back on 4/15. It sounds like the lump in left upper abdomen is soft tissue fullness below mastectomy incision, no rash, discharge or palpable lump. I will exam her next week when she returns.   Truitt Merle MD

## 2020-10-24 DIAGNOSIS — Z510 Encounter for antineoplastic radiation therapy: Secondary | ICD-10-CM | POA: Diagnosis not present

## 2020-10-25 ENCOUNTER — Ambulatory Visit: Payer: 59 | Admitting: Radiation Oncology

## 2020-10-25 NOTE — Progress Notes (Signed)
Caitlyn Hendricks   Telephone:([phone]) [phone] Fax:([phone]) [phone]   Clinic Follow up Note   Patient Care Team: [name], MD as PCP - General (Family Medicine) Mauro Kaufmann, RN as Oncology Nurse Navigator Rockwell Germany, RN as Oncology Nurse Navigator [name], MD as Consulting Physician (General Surgery) Truitt Merle, MD as Consulting Physician (Hematology) Gery Pray, MD as Consulting Physician (Radiation Oncology)  Date of Service:  10/27/2020  CHIEF COMPLAINT: skin rash and body aches   SUMMARY OF ONCOLOGIC HISTORY: Oncology History Overview Note  Cancer Staging Cancer of central portion of left female breast [facility]) Staging form: Breast, AJCC 8th Edition - Clinical: No stage assigned - Unsigned    Cancer of central portion of left female breast ([place])  02/23/2020 Mammogram   Diagnostic Mammogram 02/23/20  IMPRESSION The 2x1x2.6cm irregular mass in teh left breast at 12:00 posiiton middle depth is highly suspicious of malignancy. An [place] is recommended for further evaluation and biopsy planning purposes.   02/23/2020 Breast US   [name] 02/23/20  IMPRESSION 2 adjacent spiculated masses in the left brast at 12:00 position 3 cm from the nipple (2.1x0.9x1.1cm and 1.1x1.4x0.5cm) is suggestive of malignancy.   Multiple abnormal left subpectoral and left axillary nodes measuring 3.3x2.1 cm concerning for metastatic adenopathy.    Left breast skin thickening and edema may be secondary congestive edema due to extensive axillary adenopathy.    03/08/2020 Initial Biopsy   Diagnosis 1.Breast, left, needle core biopsy, 12:00 position, 3cmfn -INVASIVE DUCTAL CARCINOMA -SEE COMMENT  2. Lymph node, needle/core biopsy, left axilla -METASTATIC CARCINOMA INVOLVING A LYMPH NODE  -LYMPHOVASCULAR SPACE INVASION PRESENT   Microscopic Comment  1.Based on the biopsy the carcinoma appears Nottingham Grade 3 or 3 and measures 1 cm in the greatest linear extent.     03/08/2020 Receptors her2   ER- Negative 0% PR - Negative 0% HER2 - Negative  KI 67 - 80%    03/08/2020 Cancer Staging   Staging form: Breast, AJCC 8th Edition - Clinical stage from 03/08/2020: Stage IIIB (cT2, cN1, cM0, G3, ER-, PR-, HER2-) - Signed by Truitt Merle, MD on 03/10/2020   03/10/2020 Initial Diagnosis   Cancer of central portion of left female breast ([place])   03/16/2020 Breast MRI   IMPRESSION: 1. 8.1 x 7.8 x 6.6 cm biopsy proven invasive ductal carcinoma in the central right breast, involving 3 quadrants. 2. 3.0 x 1.7 x 1.1 cm satellite mass more inferiorly in lower inner quadrant of the left breast, compatible with additional malignancy. 3. Metastatic level 1 and level 2 left axillary lymph nodes. 4. No evidence of malignancy on the right.   03/17/2020 Imaging   IMPRESSION: CT CAP w contrast  1. Diffuse skin thickening in the left breast with left axillary and subpectoral lymphadenopathy, as well as a mildly enlarged left supraclavicular lymph node, which likely represents metastatic lymphadenopathy. No other definite extra nodal metastatic disease noted elsewhere in the chest, abdomen or pelvis. 2. Large mass in the central anatomic pelvis which is of uncertain origin, potentially a large exophytic fibroid or a large solid mass arising from the right ovary. Further evaluation with pelvic ultrasound is strongly recommended.   03/21/2020 Imaging   Bone Scan  IMPRESSION: Apparent arthropathy at L5. No bony metastatic disease is demonstrable on this study. Scattered foci of abnormal uptake in a pelvic mass is of uncertain etiology given absence of calcification in this mass by CT. This mass compresses the urinary bladder. It is  possible that some of the increased uptake in this area actually represents physiologic uptake within the bladder.   Kidneys noted in flank positions bilaterally.     03/22/2020 - 08/16/2020 Neo-Adjuvant Chemotherapy   Neoadjuvant Adriamycin and  Cytoxan q2weeks for 4 cycles starting 03/22/20-05/03/20 followed by weekly Taxol and Carboplatin for 12 weeks starting 05/17/20-08/16/20   03/24/2020 Imaging   US Pelvis  IMPRESSION: 1. Large pedunculated lesion directly contiguous with the uterine most suggestive of a large subserosal fibroid which measures up to 8.2 cm. 2. Additional 1 cm probable intramural fibroid in the right anterior uterine body. 3. No other acute or worrisome pelvic abnormality.   03/29/2020 Genetic Testing   Negative genetic testing on the common hereditary cancer panel.  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  The Common Hereditary Gene Panel offered by [facility] includes sequencing and/or deletion duplication testing of the following 47 genes: APC, ATM, AXIN2, BARD1, BMPR1A, BRCA1, BRCA2, BRIP1, CDH1, CDK4, CDKN2A (p14ARF), CDKN2A (p16INK4a), CHEK2, CTNNA1, DICER1, EPCAM (Deletion/duplication testing only), GREM1 (promoter region deletion/duplication testing only), KIT, MEN1, MLH1, MSH2, MSH3, MSH6, MUTYH, NBN, NF1, NHTL1, PALB2, PDGFRA, PMS2, POLD1, POLE, PTEN, RAD50, RAD51C, RAD51D, SDHB, SDHC, SDHD, SMAD4, SMARCA4. STK11, TP53, TSC1, TSC2, and VHL.  The following genes were evaluated for sequence changes only: SDHA and HOXB13 c.251G>A variant only. The report date is 03/29/2020   04/28/2020 -  Antibody Plan   Added Keytruda q3weeks starting 04/28/20 to complete 1 year of treatment. Held since 10/27/20 due to skin rash and body aches.    08/17/2020 Breast MRI   IMPRESSION: 1. The biopsy proven malignancy in the left breast and the adjacent satellite lesion have resolved in the interval. No abnormalities in these locations today. 2. No MRI evidence of malignancy in the right breast. 3. No adenopathy identified today   09/20/2020 Surgery   LEFT MODIFIED RADICAL MASTECTOMY by Dr Donne Hazel   09/20/2020 Pathology Results   FINAL MICROSCOPIC DIAGNOSIS:   A. BREAST, LEFT, MODIFIED RADICAL MASTECTOMY:  - No residual invasive  carcinoma in breast status post neoadjuvant  treatment.  - Metastatic carcinoma in (2) of (14) lymph nodes.  - Biopsy sites (one in breast, one in one of the positive lymph nodes).  - See oncology table.   B. ADDITIONAL AXILLARY CONTENTS, LEFT, DISSECTION:  - Metastatic carcinoma in (5) of (5) lymph nodes.    09/20/2020 Cancer Staging   Staging form: Breast, AJCC 8th Edition - Pathologic stage from 09/20/2020: No Stage Recommended (ypT0, pN2a, cM0, G3, ER-, PR-, HER2-) - Signed by Gardenia Phlegm, NP on 10/04/2020 Stage prefix: Post-therapy Histologic grading system: 3 grade system   10/30/2020 -  Radiation Therapy   Adjuvant Radiation with Dr Sondra Come starting 10/30/20   10/30/2020 -  Chemotherapy   Adjuvant Xeloda starting 10/30/20 at $RemoveBe'1500mg'TliOfRkBJ$  BID M-F while on RT.       CURRENT THERAPY:  Added Keytruda q3weekson10/22/21 to complete 1 year of treatment. Held since 10/27/20 due to skin rash and body aches.  Adjuvant Radiation with Dr Sondra Come starting 10/30/20 Adjuvant Xeloda starting 10/30/20 at $RemoveBe'1500mg'iqpESzHmb$  BID M-F while on RT.   INTERVAL HISTORY:  Caitlyn Hendricks is here for a follow up. She was last seen by me 10/18/20. She presents to the clinic with her mother. She notes her chest skin rash has progressed. She notes swelling of her upper abdomen. She also notes body aches in her hands and feet.     REVIEW OF SYSTEMS:   Constitutional: Denies fevers,  chills or abnormal weight loss Eyes: Denies blurriness of vision Ears, nose, mouth, throat, and face: Denies mucositis or sore throat Respiratory: Denies cough, dyspnea or wheezes Cardiovascular: Denies palpitation, chest discomfort or lower extremity swelling Gastrointestinal:  Denies nausea, heartburn or change in bowel habits Skin: (+) Chest skin rash  MSK: (+) diffuse body aches especially in hands and feet  Lymphatics: Denies new lymphadenopathy or easy bruising Neurological:Denies numbness, tingling or new  weaknesses Behavioral/Psych: Mood is stable, no new changes  All other systems were reviewed with the patient and are negative.  MEDICAL HISTORY:  Past Medical History:  Diagnosis Date  . Anemia   . Anxiety   . Arthritis   . Cancer (HCC)    Left Breast  . Depression   . GERD (gastroesophageal reflux disease)   . Headache   . Panic attack     SURGICAL HISTORY: Past Surgical History:  Procedure Laterality Date  . MASTECTOMY MODIFIED RADICAL Left 09/20/2020   Procedure: LEFT MODIFIED RADICAL MASTECTOMY;  Surgeon: [name], MD;  Location: [facility];  Service: General;  Laterality: Left;  RNFA; PEC BLOCK;  . PORTACATH PLACEMENT Right 03/21/2020   Procedure: INSERTION PORT-A-CATH WITH ULTRASOUND GUIDANCE;  Surgeon: [name], MD;  Location: [facility];  Service: General;  Laterality: Right;    I have reviewed the social history and family history with the patient and they are unchanged from previous note.  ALLERGIES:  is allergic to medroxyprogesterone.  MEDICATIONS:  Current Outpatient Medications  Medication Sig Dispense Refill  . predniSONE (DELTASONE) 10 MG tablet Take 1 tablet (10 mg total) by mouth daily with breakfast. 60 tablet 0  . ALPRAZolam (XANAX) 1 MG tablet Take 1 mg by mouth 3 (three) times daily as needed for anxiety.     Marland Kitchen azithromycin (ZITHROMAX) 250 MG tablet Take as prescribed 6 each 0  . baclofen (LIORESAL) 10 MG tablet Take 1 tablet (10 mg total) by mouth 2 (two) times daily as needed for muscle spasms. 30 each 0  . capecitabine (XELODA) 500 MG tablet Take 3 tablets (1,500 mg total) by mouth 2 (two) times daily after a meal. Take only on days of radiation Monday through Friday. 90 tablet 1  . ciprofloxacin (CIPRO) 500 MG tablet Take 1 tablet (500 mg total) by mouth 2 (two) times daily. 10 tablet 0  . citalopram (CELEXA) 20 MG tablet Take 20 mg by mouth daily.    Marland Kitchen dexamethasone (DECADRON) 4 MG tablet Take 2 tablets by mouth daily  starting the day after Carboplatin and Cytoxan x 3 days. Take with food. 30 tablet 1  . diphenhydrAMINE (BENADRYL) 25 mg capsule Take 25 mg by mouth every 6 (six) hours as needed for allergies.    . diphenhydrAMINE HCl (ZZZQUIL) 50 MG/30ML LIQD Take 30 mLs by mouth at bedtime as needed (sleep).    Marland Kitchen doxycycline (VIBRA-TABS) 100 MG tablet Take 1 tablet (100 mg total) by mouth 2 (two) times daily. 14 tablet 0  . fluconazole (DIFLUCAN) 150 MG tablet Take 1 tablet for a yeast infection if needed, may repeat in 3 days if needed Use if needed while on antibiotic. 2 tablet 0  . fluticasone (FLONASE) 50 MCG/ACT nasal spray Place 2 sprays into both nostrils daily as needed for allergies or rhinitis.    . furosemide (LASIX) 20 MG tablet Take 1 tablet (20 mg total) by mouth daily as needed for edema. 10 tablet 0  . gabapentin (NEURONTIN) 100 MG capsule Take 1 capsule (  100 mg total) by mouth 3 (three) times daily. May increase to 315m three times daily as need in a few weeks if tolerates well (Patient taking differently: Take 100 mg by mouth 3 (three) times daily as needed (hot flashes). May increase to 3011mthree times daily as need in a few weeks if tolerates well) 90 capsule 0  . ibuprofen (ADVIL) 200 MG tablet Take 800 mg by mouth every 8 (eight) hours as needed for moderate pain.    . Marland Kitchenidocaine-prilocaine (EMLA) cream Apply 1 application topically as needed (port access). Apply to affected area once 30 g 3  . magic mouthwash w/lidocaine SOLN Take 5 mLs by mouth 4 (four) times daily as needed for mouth pain. 480 mL 1  . methocarbamol (ROBAXIN) 500 MG tablet Take 1 tablet (500 mg total) by mouth every 6 (six) hours as needed for muscle spasms. 30 tablet 2  . methylPREDNISolone (MEDROL DOSEPAK) 4 MG TBPK tablet Take 1 tablet (4 mg total) by mouth taper from 4 doses each day to 1 dose and stop. 1 each 0  . metroNIDAZOLE (METROGEL) 0.75 % vaginal gel Place 1 Applicatorful vaginally 2 (two) times daily. 70 g 1  .  ondansetron (ZOFRAN) 8 MG tablet Take 1 tablet (8 mg total) by mouth 2 (two) times daily as needed. Start on the third day after chemotherapy. 30 tablet 2  . prochlorperazine (COMPAZINE) 10 MG tablet Take 1 tablet (10 mg total) by mouth every 6 (six) hours as needed (Nausea or vomiting). 30 tablet 2  . traMADol (ULTRAM) 50 MG tablet Take 1 tablet (50 mg total) by mouth daily as needed for severe pain (headache). 30 tablet 0  . triamcinolone ointment (KENALOG) 0.5 % Apply 1 application topically 2 (two) times daily. 30 g 2  . zolpidem (AMBIEN) 10 MG tablet Take 10 mg by mouth at bedtime as needed for sleep.     No current facility-administered medications for this visit.   Facility-Administered Medications Ordered in Other Visits  Medication Dose Route Frequency Provider Last Rate Last Admin  . sodium chloride flush (NS) 0.9 % injection 10 mL  10 mL Intravenous PRN BuAlla FeelingNP   10 mL at 03/28/20 1104    PHYSICAL EXAMINATION: ECOG PERFORMANCE STATUS: 1 - Symptomatic but completely ambulatory  Vitals:   10/27/20 1040  BP: 125/84  Pulse: (!) 103  Resp: 18  Temp: 98.4 F (36.9 C)  SpO2: 100%   Filed Weights   10/27/20 1040  Weight: 208 lb (94.3 kg)    GENERAL:alert, no distress and comfortable SKIN: skin color, texture, turgor are normal (+) Skin rash of chest, face and scalp EYES: normal, Conjunctiva are pink and non-injected, sclera clear  NECK: supple, thyroid normal size, non-tender, without nodularity LYMPH:  no palpable lymphadenopathy in the cervical, axillary  LUNGS: clear to auscultation and percussion with normal breathing effort HEART: regular rate & rhythm and no murmurs and no lower extremity edema ABDOMEN:abdomen soft, non-tender and normal bowel sounds Musculoskeletal:no cyanosis of digits and no clubbing  NEURO: alert & oriented x 3 with fluent speech, no focal motor/sensory deficits BREAST: S/p left mastectomy: surgical incision healed well. No palpable  mass, nodules or adenopathy bilaterally. Breast exam benign.   LABORATORY DATA:  I have reviewed the data as listed CBC Latest Ref Rng & Units 10/27/2020 10/04/2020 09/07/2020  WBC 4.0 - 10.5 K/uL 5.3 7.2 4.2  Hemoglobin 12.0 - 15.0 g/dL 11.6(L) 11.5(L) 10.9(L)  Hematocrit 36.0 - 46.0 % 37.1  35.4(L) 33.3(L)  Platelets 150 - 400 K/uL 326 384 237     CMP Latest Ref Rng & Units 10/27/2020 10/04/2020 09/07/2020  Glucose 70 - 99 mg/dL 95 136(H) 91  BUN 6 - 20 mg/dL _0 Creatinine 0.44 - 1.00 mg/dL 0.76 0.83 0.77  Sodium 135 - 145 mmol/L 140 141 139  Potassium 3.5 - 5.1 mmol/L 4.0 3.8 4.4  Chloride 98 - 111 mmol/L 103 102 105  CO2 22 - 32 mmol/L _1 Calcium 8.9 - 10.3 mg/dL 9.8 9.4 9.3  Total Protein 6.5 - 8.1 g/dL 7.5 8.0 7.2  Total Bilirubin 0.3 - 1.2 mg/dL 0.5 0.3 0.2(L)  Alkaline Phos 38 - 126 U/L 170(H) 184(H) 137(H)  AST 15 - 41 U/L _2 ALT 0 - 44 U/L 33 31 24      RADIOGRAPHIC STUDIES: I have personally reviewed the radiological images as listed and agreed with the findings in the report. MR Lumbar Spine W Wo Contrast  Result Date: 10/26/2020 CLINICAL DATA:  Lumbar radiculopathy.  History of breast cancer. EXAM: MRI LUMBAR SPINE WITHOUT AND WITH CONTRAST TECHNIQUE: Multiplanar and multiecho pulse sequences of the lumbar spine were obtained without and with intravenous contrast. CONTRAST:  53m GADAVIST GADOBUTROL 1 MMOL/ML IV SOLN COMPARISON:  Lumbar MRI 10/15/2013. Lumbar spine radiographs 02/03/2012 FINDINGS: Segmentation: Normal segmentation. Lowest fully developed disc space L5-S1. Hypoplastic disc space at S1-2. Alignment: 3 mm anterolisthesis L5-S1 has developed since the prior MRI. Vertebrae:  Negative for fracture or mass. Conus medullaris and cauda equina: Conus extends to the L2-3 level. Conus and cauda equina appear normal. Paraspinal and other soft tissues: Negative for paraspinous mass or adenopathy. Disc levels: L1-2: Negative L2-3: Negative L3-4: Negative  L4-5: Negative L5-S1: 3 mm anterolisthesis. Severe facet degeneration bilaterally has progressed in the interval. Mild subarticular and foraminal stenosis bilaterally without significant neural compression. IMPRESSION: Progressive anterolisthesis and facet degeneration at L5-S1. Mild subarticular and foraminal stenosis bilaterally. Electronically Signed   By: CFranchot GalloM.D.   On: 10/26/2020 10:59     ASSESSMENT & PLAN:  Caitlyn LAMBINGis a 40y.o. female with    1. Skin rashes on front upper chest, body aches  -She has completed doxycycline 100 mg twice daily for a week wo any improvement, skin rash continues.  -I discussed this is possible an autoimmune response from [place] Will hold Keytruda for now.  -I will start her on prednisone taper over 3-4 weeks to manage her rash and joint aches.  -If this does not improved on steroids, I would recommend whole body scan. May consider skin punch biopsy to rule out skin metastasis    2. Low back pain -She has had new onset low back pain. Her MRI Lumbar spine from 10/26/20 showed Progressive anterolisthesis and facet degeneration at L5-S1. Mild subarticular and foraminal stenosis bilaterally. -Will monitor. 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 Her 03/08/20 biopsy showedshe has grade III invasive ductal carcinoma of left breast metastatic to her left axillary LN. Her ER/PR/HER2 markers were all negative.CT CAP/Bone scan negative for distant mets. -To downstage disease and reduce risk of recurrence, she completed neoadjuvant chemo with ddAC q2weeksfor 4 cycles9/15/21-10/27/21. Followed by weekly carbo/taxolfor 12 weeks  05/17/20-2/9/22before proceeding with surgery. -Based onrecently publish Keynote 522 trial data,I added Keytruda q3weeks for 1 year treatment on 04/28/20.Tolerating well.  -She underwent left mastectomy with Dr Donne Hazel on 09/20/20. Surgical path shows No residual invasive carcinoma in breast status post neoadjuvant treatment, however has 7/19 positive LN. I discussed with this many positive LNs, she has very high risk of cancer recurrence. -I recommend adjuvant chemotherapy with oral Xeloda for 6 months to reduce her risk of cancer recurrence. She will take with RT at lower dose 1516m BID M-F, otherwise will continue 2 weeks on/1 week off. Start Xeloda on 10/30/20.  -She will proceed with adjuvant radiation on 10/30/20 with Dr KSondra Come  -Given positive LN on surgical path, I also recommend she proceed with post-mastectomy radiation with Dr KSondra Cometo reduce her risk of local recurrence.  -She has recently developed diffuse skin rash of her chest, face, scalp. She has tried antibiotics with doxycycline without improvement. I discussed this is likely an autoimmune response from [facility] Will hold KSlaughtersfor now.    4. Genetic Testingnegative for pathogenetic mutations with VUS of POLE gene  5. Anxiety/Depression, Social Support  -She is on Xanax 134mTID and Celexa4057mnd low dose Ambien for sleep. 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   6. Hot flashes -I discussed chemo will put her in perimenopause, so symptoms of hot flashes can happen. Last full period with start of chemo(03/2020) -She is on Celexa 109m63mrrently.She is also onGabapentin 100mg14mt flashes improved off chemotherapy.   7.COVID (+) on 08/02/20 with symptoms of Hoarsenessand ribcage pain. She has recovered quickly and completely   PLAN: -I called in prednisone today, will take as prescribed.  -Proceed with Radiation, will start on  10/30/20 with Xeloda 1500mg 54m M-F -F/u on 4/29 at 12 noon.    No problem-specific Assessment & Plan notes found for this encounter.   No orders of the defined types were placed in this encounter.  All questions were answered. The patient knows to call the clinic with any problems, questions or concerns. No barriers to learning was detected. The total time spent in the appointment was 30 minutes.     Caitlyn Hendricks FeTruitt Merle/22/2022   I, Amoya Joslyn Devoncting as scribe for Nayelie Gionfriddo FeTruitt Merle  I have reviewed the above documentation for accuracy and completeness, and I agree with the above.

## 2020-10-26 ENCOUNTER — Ambulatory Visit (HOSPITAL_COMMUNITY)
Admission: RE | Admit: 2020-10-26 | Discharge: 2020-10-26 | Disposition: A | Discharge status: Home / Self Care | Payer: 59 | Source: Ambulatory Visit | Attending: Hematology | Admitting: Hematology

## 2020-10-26 ENCOUNTER — Ambulatory Visit: Payer: 59 | Admitting: Radiation Oncology

## 2020-10-26 ENCOUNTER — Other Ambulatory Visit: Payer: Self-pay

## 2020-10-26 DIAGNOSIS — C50112 Malignant neoplasm of central portion of left female breast: Secondary | ICD-10-CM | POA: Diagnosis present

## 2020-10-26 DIAGNOSIS — Z171 Estrogen receptor negative status [ER-]: Secondary | ICD-10-CM | POA: Insufficient documentation

## 2020-10-26 IMAGING — MR MR LUMBAR SPINE WO/W CM
5 of 7 series · 31 of 48 positions shown · IV contrast (9 GADAVIST)
Comparison: Lumbar MRI [DATE]. Lumbar spine radiographs
[DATE]

CLINICAL DATA: Lumbar radiculopathy.  History of breast cancer.

EXAM:
MRI LUMBAR SPINE WITHOUT AND WITH CONTRAST
TECHNIQUE: Multiplanar and multiecho pulse sequences of the lumbar spine were
obtained without and with intravenous contrast.
CONTRAST:  9mL GADAVIST GADOBUTROL 1 MMOL/ML IV SOLN

[Series 6: T1 · sagittal · 4.0mm · 0.81mm/px · 3 of 14 slices shown (1 of 2)]
[im 1/14]
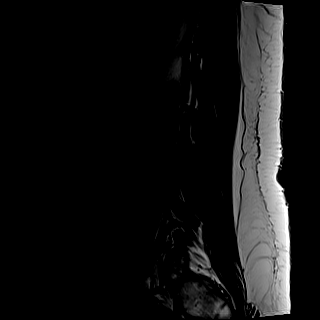
[im 7/14]
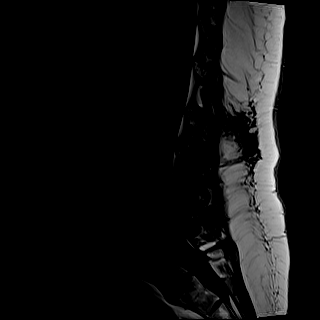
[im 14/14]
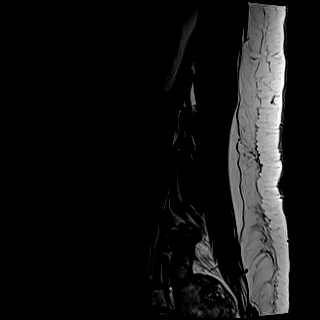

[Series 8: T2 · axial · 4.0mm · 0.62mm/px · z∈[-24,+207]mm · 11 of 43 slices shown]
[im 1/43]
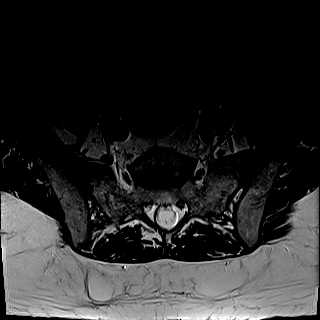
[im 5/43]
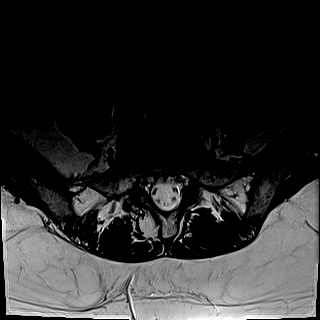
[im 9/43]
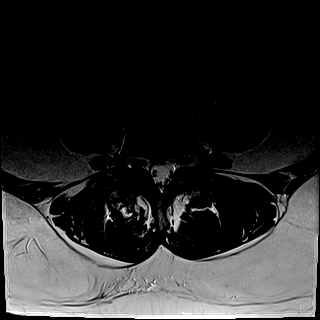
[im 13/43]
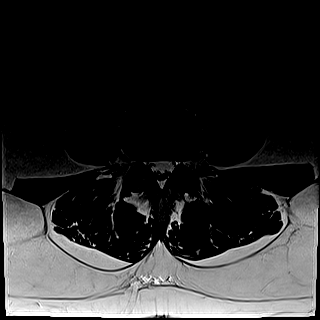
[im 17/43]
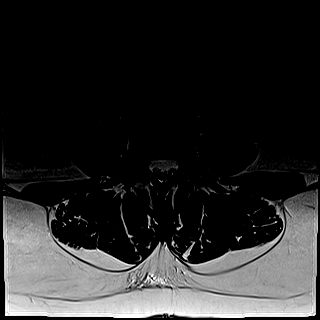
[im 22/43]
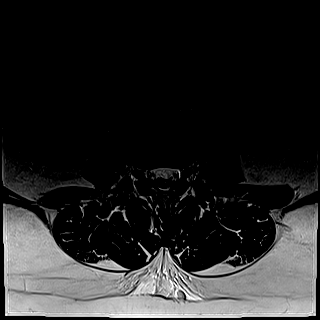
[im 26/43]
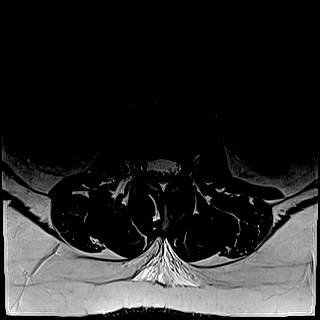
[im 30/43]
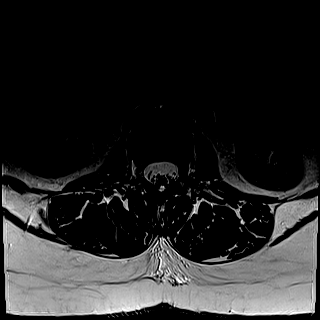
[im 34/43]
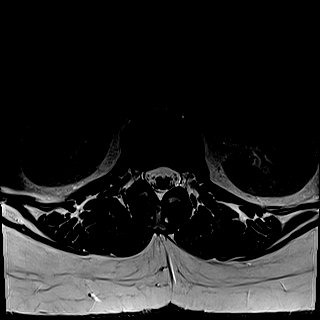
[im 38/43]
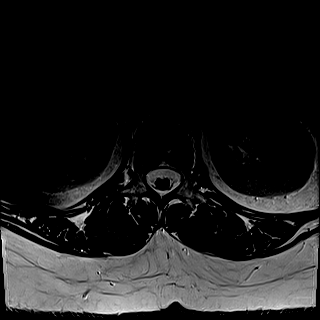
[im 43/43]
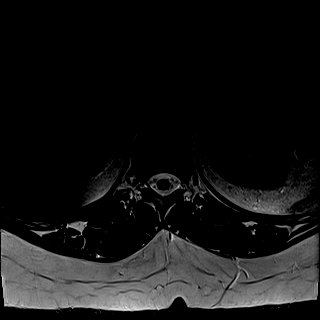

[Series 9: T1 · axial · 4.0mm · 0.39mm/px · z∈[-24,+207]mm · 11 of 43 slices shown (2 of 2)]
[im 1/43]
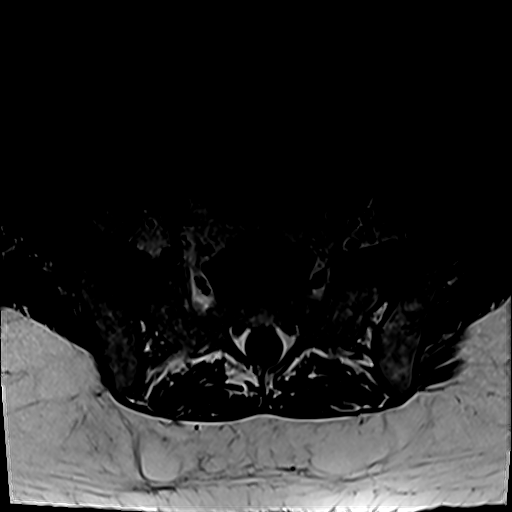
[im 5/43]
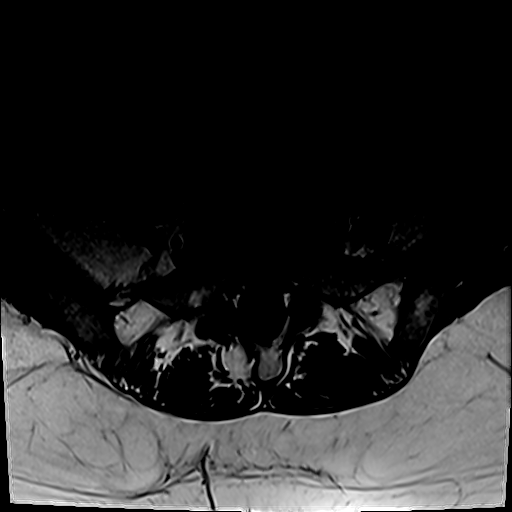
[im 9/43]
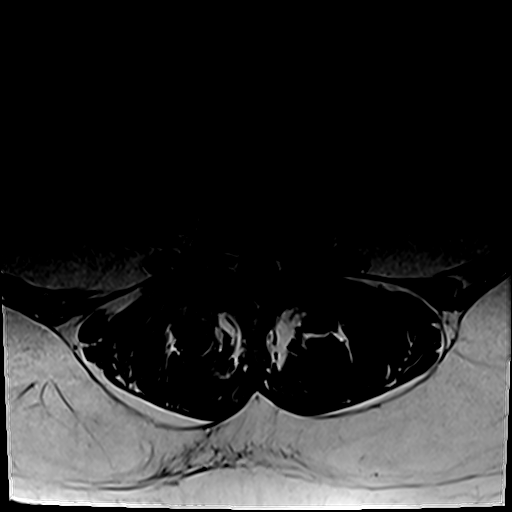
[im 13/43]
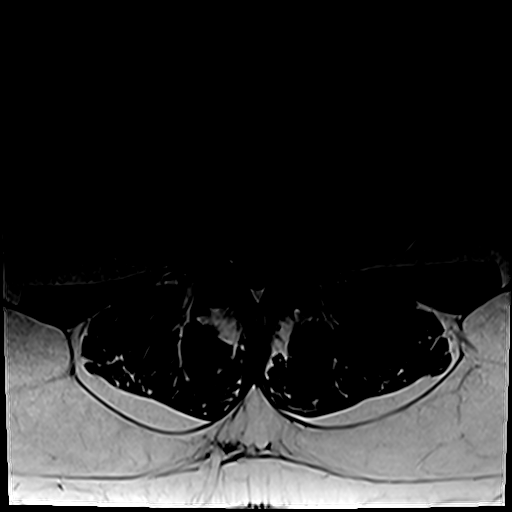
[im 17/43]
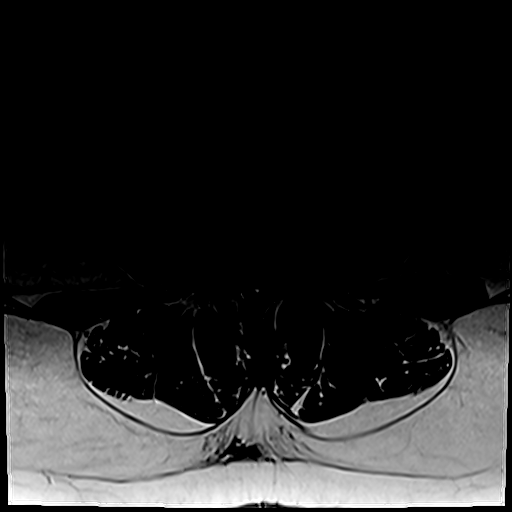
[im 22/43]
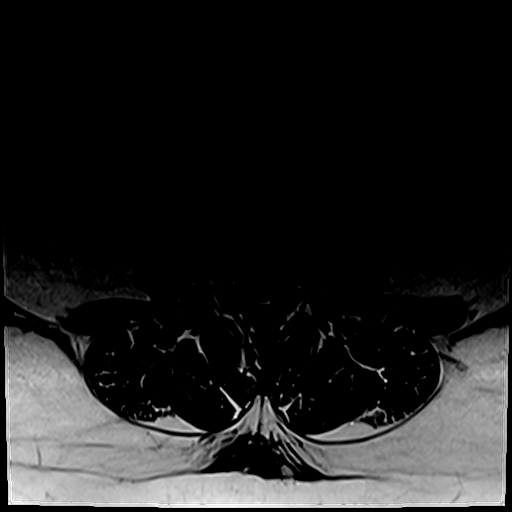
[im 26/43]
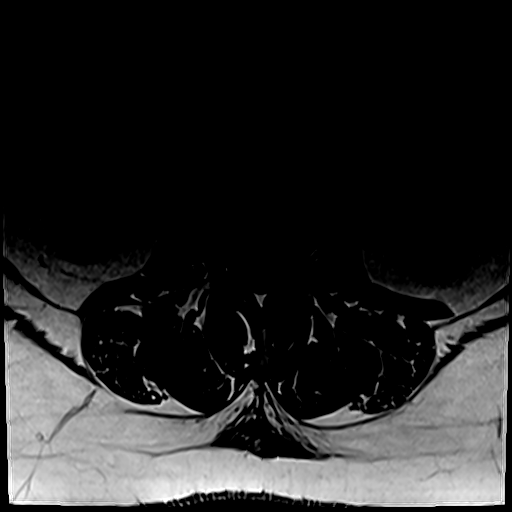
[im 30/43]
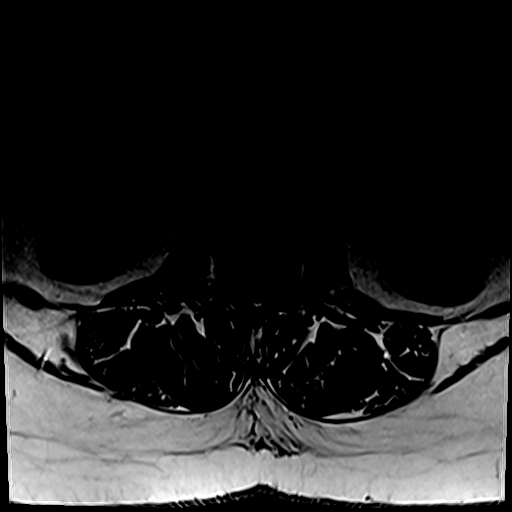
[im 34/43]
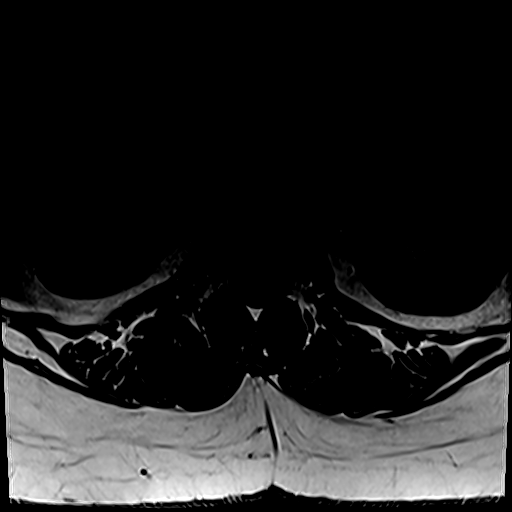
[im 38/43]
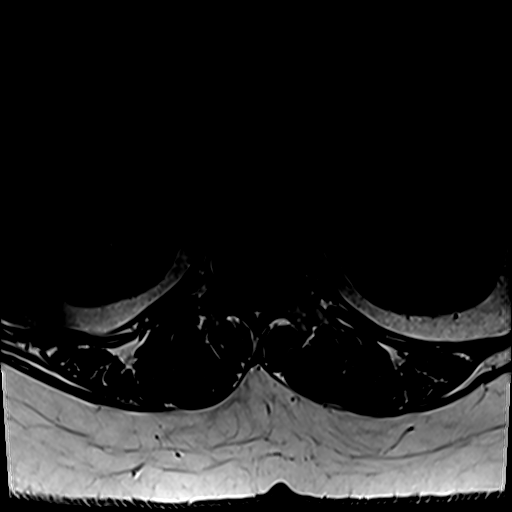
[im 43/43]
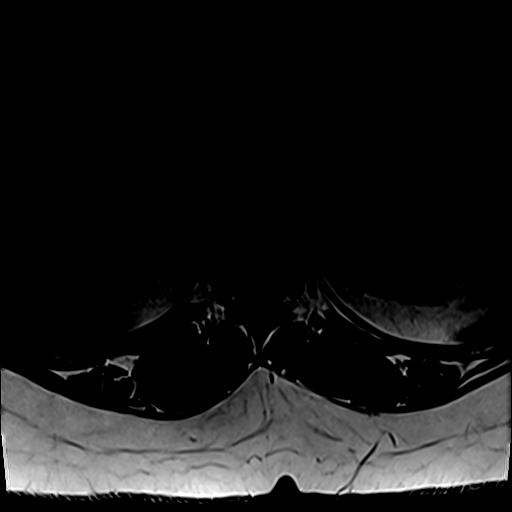

[Series 10: T2 post-contrast · sagittal · 4.0mm · 0.81mm/px · 4 of 14 slices shown]
[im 1/14]
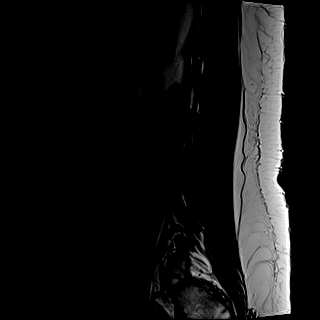
[im 5/14]
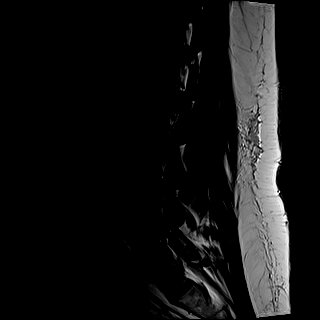
[im 9/14]
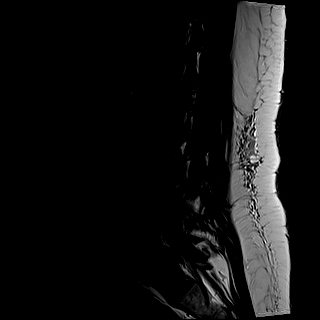
[im 14/14]
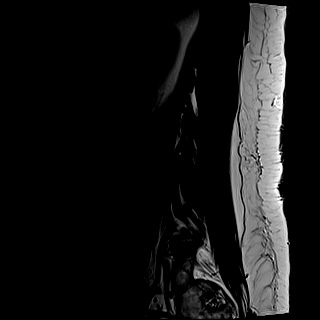

[Series 11: T1 fat-sat post-contrast · sagittal · 4.0mm · 0.81mm/px · 2 of 14 slices shown]
[im 1/14]
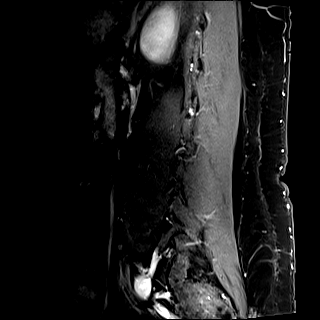
[im 5/14]
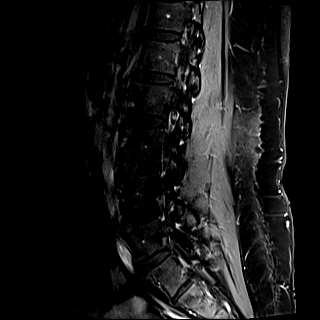

[31 of 48 positions shown; findings below may reference images not displayed]

FINDINGS: Segmentation: Normal segmentation. Lowest fully developed disc space
L5-S1. Hypoplastic disc space at S1-2.

Alignment: 3 mm anterolisthesis L5-S1 has developed since the prior
MRI.

Vertebrae:  Negative for fracture or mass.

Conus medullaris and cauda equina: Conus extends to the L2-3 level.
Conus and cauda equina appear normal.

Paraspinal and other soft tissues: Negative for paraspinous mass or
adenopathy.

Disc levels:

L1-2: Negative

L2-3: Negative

L3-4: Negative

L4-5: Negative

L5-S1: 3 mm anterolisthesis. Severe facet degeneration bilaterally
has progressed in the interval. Mild subarticular and foraminal
stenosis bilaterally without significant neural compression.
IMPRESSION: Progressive anterolisthesis and facet degeneration at L5-S1. Mild
subarticular and foraminal stenosis bilaterally.

## 2020-10-26 MED ORDER — GADOBUTROL 1 MMOL/ML IV SOLN
9.0000 mL | Freq: Once | INTRAVENOUS | Status: AC | PRN
  Administered 2020-10-26: 9 mL via INTRAVENOUS

## 2020-10-26 NOTE — Telephone Encounter (Signed)
Oral Chemotherapy Pharmacist Encounter  I spoke with patient for overview of: Xeloda for the treatment of triple negative breast cancer in conjunction with radiation, after radiation Xeloda plans to be continued for a total duration of 6 months  Counseled patient on administration, dosing, side effects, monitoring, drug-food interactions, safe handling, storage, and disposal.  Patient will take Xeloda 500mg  tablets, 3 tablets (1500mg ) by mouth in AM and 3 tabs (1500mg ) by mouth in PM, within 30 minutes of finishing meals, on days of radiation only.  Xeloda and radiation start date: 10/30/20  Adverse effects of Xeloda include but are not limited to: fatigue, decreased blood counts, GI upset, diarrhea, mouth sores, and hand-foot syndrome.  Patient requests more prochlorperazine to have on hand and in case nausea develops - will notify MD. Patient will obtain anti diarrheal and alert the office of 4 or more loose stools above baseline.   Reviewed with patient importance of keeping a medication schedule and plan for any missed doses. No barriers to medication adherence identified.  Medication reconciliation performed and medication/allergy list updated. Discussed drug-drug interaction between Xeloda and omeprazole. Patient states she no longer takes omeprazole and takes Tums PRN for heartburn/indigestion.   Insurance authorization for Xeloda has been obtained. Patient's insurance requires that Xeloda be filled through [employer] [facility] [employer]. Medication has already been delivered to patient's home.   All questions answered.  Ms. Macari voiced understanding and appreciation.   Medication education handout placed in mail for patient. Patient knows to call the office with questions or concerns. Oral Chemotherapy Clinic phone number provided to patient.   Leron Croak, PharmD, BCPS Hematology/Oncology Clinical Pharmacist [facility] [phone] 10/26/2020  11:57 AM

## 2020-10-27 ENCOUNTER — Encounter: Payer: Self-pay | Admitting: *Deleted

## 2020-10-27 ENCOUNTER — Inpatient Hospital Stay: Payer: 59

## 2020-10-27 ENCOUNTER — Ambulatory Visit: Payer: 59

## 2020-10-27 ENCOUNTER — Encounter: Payer: Self-pay | Admitting: Hematology

## 2020-10-27 ENCOUNTER — Ambulatory Visit: Admission: RE | Admit: 2020-10-27 | Payer: 59 | Source: Ambulatory Visit | Admitting: Radiation Oncology

## 2020-10-27 ENCOUNTER — Inpatient Hospital Stay: Payer: 59 | Admitting: General Practice

## 2020-10-27 ENCOUNTER — Ambulatory Visit: Payer: 59 | Admitting: Radiation Oncology

## 2020-10-27 ENCOUNTER — Inpatient Hospital Stay (HOSPITAL_BASED_OUTPATIENT_CLINIC_OR_DEPARTMENT_OTHER): Payer: 59 | Admitting: Hematology

## 2020-10-27 VITALS — BP 125/84 | HR 103 | Temp 98.4°F | Resp 18 | Ht 64.0 in | Wt 208.0 lb

## 2020-10-27 DIAGNOSIS — K219 Gastro-esophageal reflux disease without esophagitis: Secondary | ICD-10-CM | POA: Insufficient documentation

## 2020-10-27 DIAGNOSIS — Z79899 Other long term (current) drug therapy: Secondary | ICD-10-CM | POA: Insufficient documentation

## 2020-10-27 DIAGNOSIS — F418 Other specified anxiety disorders: Secondary | ICD-10-CM | POA: Insufficient documentation

## 2020-10-27 DIAGNOSIS — Z9012 Acquired absence of left breast and nipple: Secondary | ICD-10-CM | POA: Insufficient documentation

## 2020-10-27 DIAGNOSIS — C50112 Malignant neoplasm of central portion of left female breast: Secondary | ICD-10-CM

## 2020-10-27 DIAGNOSIS — Z8616 Personal history of COVID-19: Secondary | ICD-10-CM | POA: Insufficient documentation

## 2020-10-27 DIAGNOSIS — M129 Arthropathy, unspecified: Secondary | ICD-10-CM | POA: Insufficient documentation

## 2020-10-27 DIAGNOSIS — R232 Flushing: Secondary | ICD-10-CM | POA: Insufficient documentation

## 2020-10-27 DIAGNOSIS — Z95828 Presence of other vascular implants and grafts: Secondary | ICD-10-CM

## 2020-10-27 DIAGNOSIS — Z171 Estrogen receptor negative status [ER-]: Secondary | ICD-10-CM

## 2020-10-27 DIAGNOSIS — M545 Low back pain, unspecified: Secondary | ICD-10-CM | POA: Insufficient documentation

## 2020-10-27 DIAGNOSIS — R21 Rash and other nonspecific skin eruption: Secondary | ICD-10-CM | POA: Insufficient documentation

## 2020-10-27 DIAGNOSIS — C773 Secondary and unspecified malignant neoplasm of axilla and upper limb lymph nodes: Secondary | ICD-10-CM | POA: Insufficient documentation

## 2020-10-27 DIAGNOSIS — M4317 Spondylolisthesis, lumbosacral region: Secondary | ICD-10-CM | POA: Insufficient documentation

## 2020-10-27 DIAGNOSIS — D649 Anemia, unspecified: Secondary | ICD-10-CM | POA: Insufficient documentation

## 2020-10-27 DIAGNOSIS — Z510 Encounter for antineoplastic radiation therapy: Secondary | ICD-10-CM | POA: Diagnosis not present

## 2020-10-27 LAB — CBC WITH DIFFERENTIAL (CANCER CENTER ONLY)
Abs Immature Granulocytes: 0.02 10*3/uL (ref 0.00–0.07)
Basophils Absolute: 0 10*3/uL (ref 0.0–0.1)
Basophils Relative: 1 %
Eosinophils Absolute: 0.1 10*3/uL (ref 0.0–0.5)
Eosinophils Relative: 1 %
HCT: 37.1 % (ref 36.0–46.0)
Hemoglobin: 11.6 g/dL — ABNORMAL LOW (ref 12.0–15.0)
Immature Granulocytes: 0 %
Lymphocytes Relative: 19 %
Lymphs Abs: 1 10*3/uL (ref 0.7–4.0)
MCH: 29.1 pg (ref 26.0–34.0)
MCHC: 31.3 g/dL (ref 30.0–36.0)
MCV: 93 fL (ref 80.0–100.0)
Monocytes Absolute: 0.5 10*3/uL (ref 0.1–1.0)
Monocytes Relative: 9 %
Neutro Abs: 3.7 10*3/uL (ref 1.7–7.7)
Neutrophils Relative %: 70 %
Platelet Count: 326 10*3/uL (ref 150–400)
RBC: 3.99 MIL/uL (ref 3.87–5.11)
RDW: 13.7 % (ref 11.5–15.5)
WBC Count: 5.3 10*3/uL (ref 4.0–10.5)
nRBC: 0 % (ref 0.0–0.2)

## 2020-10-27 LAB — CMP (CANCER CENTER ONLY)
ALT: 33 U/L (ref 0–44)
AST: 28 U/L (ref 15–41)
Albumin: 3.9 g/dL (ref 3.5–5.0)
Alkaline Phosphatase: 170 U/L — ABNORMAL HIGH (ref 38–126)
Anion gap: 10 (ref 5–15)
BUN: 15 mg/dL (ref 6–20)
CO2: 27 mmol/L (ref 22–32)
Calcium: 9.8 mg/dL (ref 8.9–10.3)
Chloride: 103 mmol/L (ref 98–111)
Creatinine: 0.76 mg/dL (ref 0.44–1.00)
GFR, Estimated: >60 mL/min (ref >60)
Glucose, Bld: 95 mg/dL (ref 70–99)
Potassium: 4 mmol/L (ref 3.5–5.1)
Sodium: 140 mmol/L (ref 135–145)
Total Bilirubin: 0.5 mg/dL (ref 0.3–1.2)
Total Protein: 7.5 g/dL (ref 6.5–8.1)

## 2020-10-27 LAB — TSH: TSH: 1.86 u[IU]/mL (ref 0.308–3.960)

## 2020-10-27 MED ORDER — SODIUM CHLORIDE 0.9% FLUSH
10.0000 mL | Freq: Once | INTRAVENOUS | Status: AC
  Administered 2020-10-27: 10 mL
  Filled 2020-10-27: qty 10

## 2020-10-27 MED ORDER — PROCHLORPERAZINE MALEATE 10 MG PO TABS: 10.0000 mg | ORAL_TABLET | Freq: Four times a day (QID) | ORAL | 2 refills | Status: DC | PRN

## 2020-10-27 MED ORDER — HEPARIN SOD (PORK) LOCK FLUSH 100 UNIT/ML IV SOLN
500.0000 [IU] | Freq: Once | INTRAVENOUS | Status: AC
  Administered 2020-10-27: 500 [IU]
  Filled 2020-10-27: qty 5

## 2020-10-27 MED ORDER — PREDNISONE 10 MG PO TABS: 10.0000 mg | ORAL_TABLET | Freq: Every day | ORAL | 0 refills | Status: DC

## 2020-10-27 NOTE — Patient Instructions (Signed)

## 2020-10-30 ENCOUNTER — Telehealth: Payer: Self-pay | Admitting: Hematology

## 2020-10-30 ENCOUNTER — Encounter: Payer: Self-pay | Admitting: *Deleted

## 2020-10-30 ENCOUNTER — Ambulatory Visit: Payer: 59

## 2020-10-30 ENCOUNTER — Ambulatory Visit: Payer: 59 | Admitting: Radiation Oncology

## 2020-10-30 ENCOUNTER — Ambulatory Visit
Admission: RE | Admit: 2020-10-30 | Discharge: 2020-10-30 | Disposition: A | Discharge status: Home / Self Care | Payer: 59 | Source: Ambulatory Visit | Attending: Radiation Oncology | Admitting: Radiation Oncology

## 2020-10-30 ENCOUNTER — Other Ambulatory Visit: Payer: Self-pay

## 2020-10-30 DIAGNOSIS — Z510 Encounter for antineoplastic radiation therapy: Secondary | ICD-10-CM | POA: Diagnosis not present

## 2020-10-30 NOTE — Telephone Encounter (Signed)
Scheduled follow-up appointments per 4/22 los. Patient is aware. 

## 2020-10-30 NOTE — Progress Notes (Signed)
Caitlyn Hendricks   [phone] [phone]   Clinic Follow up Note   Patient Care Team: [name], MD as PCP - General (Family Medicine) Mauro Kaufmann, RN as Oncology Nurse Navigator Rockwell Germany, RN as Oncology Nurse Navigator [name], MD as Consulting Physician (General Surgery) Truitt Merle, MD as Consulting Physician (Hematology) Gery Pray, MD as Consulting Physician (Radiation Oncology)  Date of Service:  11/03/2020  CHIEF COMPLAINT: F/u of left breast cancer and skin rash and body aches   SUMMARY OF ONCOLOGIC HISTORY: Oncology History Overview Note  Cancer Staging Cancer of central portion of left female breast [facility]) Staging form: Breast, AJCC 8th Edition - Clinical: No stage assigned - Unsigned    Cancer of central portion of left female breast ([place])  02/23/2020 Mammogram   Diagnostic Mammogram 02/23/20  IMPRESSION The 2x1x2.6cm irregular mass in teh left breast at 12:00 posiiton middle depth is highly suspicious of malignancy. An [place] is recommended for further evaluation and biopsy planning purposes.   02/23/2020 Breast US   [name] 02/23/20  IMPRESSION 2 adjacent spiculated masses in the left brast at 12:00 position 3 cm from the nipple (2.1x0.9x1.1cm and 1.1x1.4x0.5cm) is suggestive of malignancy.   Multiple abnormal left subpectoral and left axillary nodes measuring 3.3x2.1 cm concerning for metastatic adenopathy.    Left breast skin thickening and edema may be secondary congestive edema due to extensive axillary adenopathy.    03/08/2020 Initial Biopsy   Diagnosis 1.Breast, left, needle core biopsy, 12:00 position, 3cmfn -INVASIVE DUCTAL CARCINOMA -SEE COMMENT  2. Lymph node, needle/core biopsy, left axilla -METASTATIC CARCINOMA INVOLVING A LYMPH NODE  -LYMPHOVASCULAR SPACE INVASION PRESENT   Microscopic Comment  1.Based on the biopsy the carcinoma appears Nottingham Grade 3 or 3 and measures 1 cm  in the greatest linear extent.    03/08/2020 Receptors her2   ER- Negative 0% PR - Negative 0% HER2 - Negative  KI 67 - 80%    03/08/2020 Cancer Staging   Staging form: Breast, AJCC 8th Edition - Clinical stage from 03/08/2020: Stage IIIB (cT2, cN1, cM0, G3, ER-, PR-, HER2-) - Signed by Truitt Merle, MD on 03/10/2020   03/10/2020 Initial Diagnosis   Cancer of central portion of left female breast ([place])   03/16/2020 Breast MRI   IMPRESSION: 1. 8.1 x 7.8 x 6.6 cm biopsy proven invasive ductal carcinoma in the central right breast, involving 3 quadrants. 2. 3.0 x 1.7 x 1.1 cm satellite mass more inferiorly in lower inner quadrant of the left breast, compatible with additional malignancy. 3. Metastatic level 1 and level 2 left axillary lymph nodes. 4. No evidence of malignancy on the right.   03/17/2020 Imaging   IMPRESSION: CT CAP w contrast  1. Diffuse skin thickening in the left breast with left axillary and subpectoral lymphadenopathy, as well as a mildly enlarged left supraclavicular lymph node, which likely represents metastatic lymphadenopathy. No other definite extra nodal metastatic disease noted elsewhere in the chest, abdomen or pelvis. 2. Large mass in the central anatomic pelvis which is of uncertain origin, potentially a large exophytic fibroid or a large solid mass arising from the right ovary. Further evaluation with pelvic ultrasound is strongly recommended.   03/21/2020 Imaging   Bone Scan  IMPRESSION: Apparent arthropathy at L5. No bony metastatic disease is demonstrable on this study. Scattered foci of abnormal uptake in a pelvic mass is of uncertain etiology given absence of calcification in this mass by CT. This mass  compresses the urinary bladder. It is possible that some of the increased uptake in this area actually represents physiologic uptake within the bladder.   Kidneys noted in flank positions bilaterally.     03/22/2020 - 08/16/2020 Neo-Adjuvant Chemotherapy    Neoadjuvant Adriamycin and Cytoxan q2weeks for 4 cycles starting 03/22/20-05/03/20 followed by weekly Taxol and Carboplatin for 12 weeks starting 05/17/20-08/16/20   03/24/2020 Imaging   US Pelvis  IMPRESSION: 1. Large pedunculated lesion directly contiguous with the uterine most suggestive of a large subserosal fibroid which measures up to 8.2 cm. 2. Additional 1 cm probable intramural fibroid in the right anterior uterine body. 3. No other acute or worrisome pelvic abnormality.   03/29/2020 Genetic Testing   Negative genetic testing on the common hereditary cancer panel.  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  The Common Hereditary Gene Panel offered by [facility] includes sequencing and/or deletion duplication testing of the following 47 genes: APC, ATM, AXIN2, BARD1, BMPR1A, BRCA1, BRCA2, BRIP1, CDH1, CDK4, CDKN2A (p14ARF), CDKN2A (p16INK4a), CHEK2, CTNNA1, DICER1, EPCAM (Deletion/duplication testing only), GREM1 (promoter region deletion/duplication testing only), KIT, MEN1, MLH1, MSH2, MSH3, MSH6, MUTYH, NBN, NF1, NHTL1, PALB2, PDGFRA, PMS2, POLD1, POLE, PTEN, RAD50, RAD51C, RAD51D, SDHB, SDHC, SDHD, SMAD4, SMARCA4. STK11, TP53, TSC1, TSC2, and VHL.  The following genes were evaluated for sequence changes only: SDHA and HOXB13 c.251G>A variant only. The report date is 03/29/2020   04/28/2020 - 10/27/2020 Antibody Plan   Added Keytruda q3weeks starting 04/28/20 to complete 1 year of treatment. Held since 10/27/20 due to skin rash and body aches.    08/17/2020 Breast MRI   IMPRESSION: 1. The biopsy proven malignancy in the left breast and the adjacent satellite lesion have resolved in the interval. No abnormalities in these locations today. 2. No MRI evidence of malignancy in the right breast. 3. No adenopathy identified today   09/20/2020 Surgery   LEFT MODIFIED RADICAL MASTECTOMY by Dr Donne Hazel   09/20/2020 Pathology Results   FINAL MICROSCOPIC DIAGNOSIS:   A. BREAST, LEFT, MODIFIED  RADICAL MASTECTOMY:  - No residual invasive carcinoma in breast status post neoadjuvant  treatment.  - Metastatic carcinoma in (2) of (14) lymph nodes.  - Biopsy sites (one in breast, one in one of the positive lymph nodes).  - See oncology table.   B. ADDITIONAL AXILLARY CONTENTS, LEFT, DISSECTION:  - Metastatic carcinoma in (5) of (5) lymph nodes.    09/20/2020 Cancer Staging   Staging form: Breast, AJCC 8th Edition - Pathologic stage from 09/20/2020: No Stage Recommended (ypT0, pN2a, cM0, G3, ER-, PR-, HER2-) - Signed by Gardenia Phlegm, NP on 10/04/2020 Stage prefix: Post-therapy Histologic grading system: 3 grade system   10/30/2020 -  Radiation Therapy   Adjuvant Radiation with Dr Sondra Come starting 10/30/20   10/30/2020 -  Chemotherapy   Adjuvant Xeloda starting 10/30/20 at 1562m BID M-F while on RT.       CURRENT THERAPY:  -Added Keytruda q3weekson10/22/21 to complete 1 year of treatment. Held since 10/27/20 due to skin rash and body aches.  -Adjuvant Radiation with Dr KSondra Comestarting 10/30/20 -Adjuvant Xelodastarting 10/30/20 at 15068mBID M-F while on RT.   INTERVAL HISTORY:  Caitlyn Hendricks here for a follow up. She presents to the clinic alone. She notes her skin rash started to clear up after 1 day of oral steroids. She notes she is on day 6 of 7 day 80m19mrednisone. She notes she has improved body pain.  She notes she is planning to  go to concert this weekend and would like to have a beer while there.    REVIEW OF SYSTEMS:   Constitutional: Denies fevers, chills or abnormal weight loss Eyes: Denies blurriness of vision Ears, nose, mouth, throat, and face: Denies mucositis or sore throat Respiratory: Denies cough, dyspnea or wheezes Cardiovascular: Denies palpitation, chest discomfort or lower extremity swelling Gastrointestinal:  Denies nausea, heartburn or change in bowel habits Skin: Denies abnormal skin rashes (+) Improved skin rash  Lymphatics: Denies new  lymphadenopathy or easy bruising Neurological:Denies numbness, tingling or new weaknesses Behavioral/Psych: Mood is stable, no new changes  All other systems were reviewed with the patient and are negative.  MEDICAL HISTORY:  Past Medical History:  Diagnosis Date  . Anemia   . Anxiety   . Arthritis   . Cancer (HCC)    Left Breast  . Depression   . GERD (gastroesophageal reflux disease)   . Headache   . Panic attack     SURGICAL HISTORY: Past Surgical History:  Procedure Laterality Date  . MASTECTOMY MODIFIED RADICAL Left 09/20/2020   Procedure: LEFT MODIFIED RADICAL MASTECTOMY;  Surgeon: [name], MD;  Location: [facility];  Service: General;  Laterality: Left;  RNFA; PEC BLOCK;  . PORTACATH PLACEMENT Right 03/21/2020   Procedure: INSERTION PORT-A-CATH WITH ULTRASOUND GUIDANCE;  Surgeon: [name], MD;  Location: [facility];  Service: General;  Laterality: Right;    I have reviewed the social history and family history with the patient and they are unchanged from previous note.  ALLERGIES:  is allergic to medroxyprogesterone.  MEDICATIONS:  Current Outpatient Medications  Medication Sig Dispense Refill  . ALPRAZolam (XANAX) 1 MG tablet Take 1 mg by mouth 3 (three) times daily as needed for anxiety.     Marland Kitchen azithromycin (ZITHROMAX) 250 MG tablet Take as prescribed 6 each 0  . baclofen (LIORESAL) 10 MG tablet Take 1 tablet (10 mg total) by mouth 2 (two) times daily as needed for muscle spasms. 30 each 0  . capecitabine (XELODA) 500 MG tablet Take 3 tablets (1,500 mg total) by mouth 2 (two) times daily after a meal. Take only on days of radiation Monday through Friday. 90 tablet 1  . ciprofloxacin (CIPRO) 500 MG tablet Take 1 tablet (500 mg total) by mouth 2 (two) times daily. 10 tablet 0  . citalopram (CELEXA) 20 MG tablet Take 20 mg by mouth daily.    Marland Kitchen dexamethasone (DECADRON) 4 MG tablet Take 2 tablets by mouth daily starting the day after  Carboplatin and Cytoxan x 3 days. Take with food. 30 tablet 1  . diphenhydrAMINE (BENADRYL) 25 mg capsule Take 25 mg by mouth every 6 (six) hours as needed for allergies.    . diphenhydrAMINE HCl (ZZZQUIL) 50 MG/30ML LIQD Take 30 mLs by mouth at bedtime as needed (sleep).    Marland Kitchen doxycycline (VIBRA-TABS) 100 MG tablet Take 1 tablet (100 mg total) by mouth 2 (two) times daily. 14 tablet 0  . fluconazole (DIFLUCAN) 150 MG tablet Take 1 tablet for a yeast infection if needed, may repeat in 3 days if needed Use if needed while on antibiotic. 2 tablet 0  . fluticasone (FLONASE) 50 MCG/ACT nasal spray Place 2 sprays into both nostrils daily as needed for allergies or rhinitis.    . furosemide (LASIX) 20 MG tablet Take 1 tablet (20 mg total) by mouth daily as needed for edema. 10 tablet 0  . gabapentin (NEURONTIN) 100 MG capsule Take 1 capsule (100 mg total)  by mouth 3 (three) times daily. May increase to 374m three times daily as need in a few weeks if tolerates well (Patient taking differently: Take 100 mg by mouth 3 (three) times daily as needed (hot flashes). May increase to 3053mthree times daily as need in a few weeks if tolerates well) 90 capsule 0  . ibuprofen (ADVIL) 200 MG tablet Take 800 mg by mouth every 8 (eight) hours as needed for moderate pain.    . Marland Kitchenidocaine-prilocaine (EMLA) cream Apply 1 application topically as needed (port access). Apply to affected area once 30 g 3  . magic mouthwash w/lidocaine SOLN Take 5 mLs by mouth 4 (four) times daily as needed for mouth pain. 480 mL 1  . methocarbamol (ROBAXIN) 500 MG tablet Take 1 tablet (500 mg total) by mouth every 6 (six) hours as needed for muscle spasms. 30 tablet 2  . methylPREDNISolone (MEDROL DOSEPAK) 4 MG TBPK tablet Take 1 tablet (4 mg total) by mouth taper from 4 doses each day to 1 dose and stop. 1 each 0  . metroNIDAZOLE (METROGEL) 0.75 % vaginal gel Place 1 Applicatorful vaginally 2 (two) times daily. 70 g 1  . ondansetron (ZOFRAN) 8  MG tablet Take 1 tablet (8 mg total) by mouth 2 (two) times daily as needed. Start on the third day after chemotherapy. 30 tablet 2  . predniSONE (DELTASONE) 10 MG tablet Take 1 tablet (10 mg total) by mouth daily with breakfast. 60 tablet 0  . prochlorperazine (COMPAZINE) 10 MG tablet Take 1 tablet (10 mg total) by mouth every 6 (six) hours as needed (Nausea or vomiting). 30 tablet 2  . traMADol (ULTRAM) 50 MG tablet Take 1 tablet (50 mg total) by mouth daily as needed for severe pain (headache). 30 tablet 0  . triamcinolone ointment (KENALOG) 0.5 % Apply 1 application topically 2 (two) times daily. 30 g 2  . zolpidem (AMBIEN) 10 MG tablet Take 10 mg by mouth at bedtime as needed for sleep.     No current facility-administered medications for this visit.   Facility-Administered Medications Ordered in Other Visits  Medication Dose Route Frequency Provider Last Rate Last Admin  . sodium chloride flush (NS) 0.9 % injection 10 mL  10 mL Intravenous PRN BuAlla FeelingNP   10 mL at 03/28/20 1104    PHYSICAL EXAMINATION: ECOG PERFORMANCE STATUS: 1 - Symptomatic but completely ambulatory  Vitals:   11/03/20 1149  BP: 130/82  Pulse: 95  Resp: 18  Temp: (!) 97.1 F (36.2 C)  SpO2: 97%   Filed Weights   11/03/20 1149  Weight: 208 lb 12.8 oz (94.7 kg)    Due to COVID19 we will limit examination to appearance. Patient had no complaints.  GENERAL:alert, no distress and comfortable SKIN: skin color normal (+) Diffuse Skin rash is near clear  EYES: normal, Conjunctiva are pink and non-injected, sclera clear  NEURO: alert & oriented x 3 with fluent speech   LABORATORY DATA:  I have reviewed the data as listed CBC Latest Ref Rng & Units 10/27/2020 10/04/2020 09/07/2020  WBC 4.0 - 10.5 K/uL 5.3 7.2 4.2  Hemoglobin 12.0 - 15.0 g/dL 11.6(L) 11.5(L) 10.9(L)  Hematocrit 36.0 - 46.0 % 37.1 35.4(L) 33.3(L)  Platelets 150 - 400 K/uL 326 384 237     CMP Latest Ref Rng & Units 10/27/2020 10/04/2020  09/07/2020  Glucose 70 - 99 mg/dL 95 136(H) 91  BUN 6 - 20 mg/dL 15 16 14   Creatinine 0.44 - 1.00  mg/dL 0.76 0.83 0.77  Sodium 135 - 145 mmol/L 140 141 139  Potassium 3.5 - 5.1 mmol/L 4.0 3.8 4.4  Chloride 98 - 111 mmol/L 103 102 105  CO2 22 - 32 mmol/L 27 25 25   Calcium 8.9 - 10.3 mg/dL 9.8 9.4 9.3  Total Protein 6.5 - 8.1 g/dL 7.5 8.0 7.2  Total Bilirubin 0.3 - 1.2 mg/dL 0.5 0.3 0.2(L)  Alkaline Phos 38 - 126 U/L 170(H) 184(H) 137(H)  AST 15 - 41 U/L 28 26 23   ALT 0 - 44 U/L 33 31 24      RADIOGRAPHIC STUDIES: I have personally reviewed the radiological images as listed and agreed with the findings in the report. No results found.   ASSESSMENT & PLAN:  Caitlyn Hendricks is a 40 y.o. female with   1. Skin rashes on front upper chest, body aches  -She has completed doxycycline 100 mg twice daily for a week wo any improvement, skin rash continues.  -I discussed this is possible an autoimmune response from [place]. We have held [place] since 10/27/20.  -I started her on prednisone taper over 3-4 weeks to manage her rash and joint aches on 10/27/20.  -After first dose prednisone, her skin rash has started to clear up and body pain improved. On exam today, her rash is much improved. She is fine to complete steroid taper as instructed, or little faster .   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 Her 03/08/20 biopsy showedshe has grade III invasive ductal carcinoma of left breast metastatic to her left axillary LN. Her ER/PR/HER2 markers were all negative.CT CAP/Bone scan negative for distant mets. -To downstage disease and reduce risk of recurrence, she completed neoadjuvant chemo with ddAC  q2weeksfor 4 cycles9/15/21-10/27/21. Followed by weekly carbo/taxolfor 12 weeks 05/17/20-2/9/22before proceeding with surgery. -Based onrecently publish Ke[employer] 522 trial data,I added Keytruda q3weeks for 1 year treatment on 04/28/20.Tolerating well. -She underwent left mastectomy with Dr Donne Hazel on 09/20/20. Surgical path showsNo residual invasive carcinoma in breast status post neoadjuvant treatment,however has 7/19 positive LN. I discussed with this many positive LNs, she has veryhigh risk of cancer recurrence. -I started her on adjuvant chemotherapy with oral Xeloda for 6 months to reduce her risk of cancer recurrence. She will take with RT at lower dose 1586m BID M-F, otherwise will continue 2 weeks on/1 week off. Started Xeloda on 10/30/20.  -She started djuvant radiation on 10/30/20 with Dr KSondra Come  -Tolerating Xeloda and Radiation well so far. Will continue.  -She has responded to Prednisone, off Keytruda. Will continue to hold Keytruda. Will re-evaluate after radiation. I also discussed option of full body scan later this year.  -F/u on 11/20/20.   3. Low back pain -She has had new onset low back pain. Her MRI Lumbar spine from 10/26/20 showed Progressive anterolisthesis and facet degeneration at L5-S1. Mild subarticular and foraminal stenosis bilaterally. -Will monitor. Will refer to orthopedics if gets worse    4. Genetic Testingnegative for pathogenetic mutations with VUS of POLE gene  5. Anxiety/Depression, Social Support  -She is on Xanax 145mTID and Celexa4030mnd low dose Ambien for sleep. 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  6. Hot flashes -I discussed chemo will put her in  perimenopause, so symptoms of hot flashes can happen. Last full period with start of chemo(03/2020) -She is on Celexa 34m currently.She is also onGabapentin 1050mHot flashes improved off  chemotherapy.  7.COVID (+) on 08/02/20 with symptoms of Hoarsenessand ribcage pain. She has recovered quickly and completely   PLAN: -Continue prednisone taper  -Continue radiation -Continue Xeloda 150076mID, M-F with concurrent radiation  -lab weekly  -Flush, F/u on 11/20/20.    No problem-specific Assessment & Plan notes found for this encounter.   No orders of the defined types were placed in this encounter.  All questions were answered. The patient knows to call the clinic with any problems, questions or concerns. No barriers to learning was detected. The total time spent in the appointment was 25 minutes.     YanTruitt MerleD 11/03/2020   I, AmoJoslyn Devonm acting as scribe for YanTruitt MerleD.   I have reviewed the above documentation for accuracy and completeness, and I agree with the above.

## 2020-10-31 ENCOUNTER — Ambulatory Visit: Payer: 59 | Admitting: Rehabilitation

## 2020-10-31 ENCOUNTER — Other Ambulatory Visit: Payer: Self-pay

## 2020-10-31 ENCOUNTER — Ambulatory Visit
Admission: RE | Admit: 2020-10-31 | Discharge: 2020-10-31 | Disposition: A | Discharge status: Home / Self Care | Payer: 59 | Source: Ambulatory Visit | Attending: Radiation Oncology | Admitting: Radiation Oncology

## 2020-10-31 ENCOUNTER — Encounter: Payer: Self-pay | Admitting: Rehabilitation

## 2020-10-31 ENCOUNTER — Ambulatory Visit: Payer: 59

## 2020-10-31 ENCOUNTER — Telehealth: Payer: Self-pay | Admitting: Hematology

## 2020-10-31 DIAGNOSIS — R293 Abnormal posture: Secondary | ICD-10-CM

## 2020-10-31 DIAGNOSIS — Z483 Aftercare following surgery for neoplasm: Secondary | ICD-10-CM

## 2020-10-31 DIAGNOSIS — C50112 Malignant neoplasm of central portion of left female breast: Secondary | ICD-10-CM

## 2020-10-31 DIAGNOSIS — M25612 Stiffness of left shoulder, not elsewhere classified: Secondary | ICD-10-CM

## 2020-10-31 DIAGNOSIS — M79602 Pain in left arm: Secondary | ICD-10-CM

## 2020-10-31 DIAGNOSIS — Z510 Encounter for antineoplastic radiation therapy: Secondary | ICD-10-CM | POA: Diagnosis not present

## 2020-10-31 MED ORDER — ALRA NON-METALLIC DEODORANT (RAD-ONC)
1.0000 "application " | Freq: Once | TOPICAL | Status: AC
  Administered 2020-10-31: 1 via TOPICAL

## 2020-10-31 MED ORDER — RADIAPLEXRX EX GEL: Freq: Once | CUTANEOUS | Status: AC

## 2020-10-31 NOTE — Patient Instructions (Signed)
  Deep Effective Breath   Standing, sitting, or laying down, place both hands on the belly. Take a deep breath IN, expanding the belly; then breath OUT, contracting the belly.   Axilla to Axilla - Sweep   On both sides make 5 circles in the armpit,  then pump _5__ times from left armpit across chest to right armpit, making a pathway.   Axilla to Inguinal Nodes - Sweep   On involved side, make 5 circles at groin at panty line,  then pump _5__ times from armpit along side of trunk to outer hip, making your other pathway.    Arm Posterior: Elbow to Shoulder - Sweep   Pump _5__ times from back of elbow to top of shoulder.   Then inner to outer upper arm _5_ times, then outer arm again _5_ times.   Then back to the pathways _2-3_ times.  STOP HERE FOR UPPER ARM ONLY  ARM: [place] Wrist to Elbow - Sweep   Pump or stationary circles _5__ times from wrist to elbow making sure to do both sides of the forearm. Then retrace your steps to the outer arm, and the pathways _2-3_ times each. Do _1__ time per day.  Copyright  VHI. All rights reserved.  ARM: Dorsum of Hand to Shoulder - Sweep   Pump or stationary circles _5__ times on back of hand including knuckle spaces and individual fingers if needed working up towards the wrist, then retrace all your steps working back up the forearm, doing both sides; upper outer arm and back to your pathways _2-3_ times each. Then do 5 circles again at uninvolved armpit and involved groin where you started! Good job!! Do __1_ time per day.  Copyright  VHI. All rights reserved.

## 2020-10-31 NOTE — Telephone Encounter (Signed)
Cancelled appts per 4/26 sch msg. Pt aware.

## 2020-10-31 NOTE — Therapy (Signed)
[place], [place], [address] Phone: [phone]   Fax:  [phone]  Physical Therapy Treatment  Patient Details  Name: Caitlyn Hendricks MRN: [id number] Date of Birth: January 06, 1981 Referring Provider (PT): Donne Hazel   Encounter Date: 10/31/2020   PT End of Session - 10/31/20 1407    Visit Number 5    Number of Visits 10    Date for PT Re-Evaluation 11/08/20    PT Start Time 1300    PT Stop Time 1355    PT Time Calculation (min) 55 min    Activity Tolerance Patient tolerated treatment well    Behavior During Therapy [facility] for tasks assessed/performed           Past Medical History:  Diagnosis Date  . Anemia   . Anxiety   . Arthritis   . Cancer (HCC)    Left Breast  . Depression   . GERD (gastroesophageal reflux disease)   . Headache   . Panic attack     Past Surgical History:  Procedure Laterality Date  . MASTECTOMY MODIFIED RADICAL Left 09/20/2020   Procedure: LEFT MODIFIED RADICAL MASTECTOMY;  Surgeon: [name], MD;  Location: [facility];  Service: General;  Laterality: Left;  RNFA; PEC BLOCK;  . PORTACATH PLACEMENT Right 03/21/2020   Procedure: INSERTION PORT-A-CATH WITH ULTRASOUND GUIDANCE;  Surgeon: [name], MD;  Location: [facility];  Service: General;  Laterality: Right;    There were no vitals filed for this visit.   Subjective Assessment - 10/31/20 1300    Subjective 2nd day of radiation; I tried a sleeve and was able to wear it x 49min.  I am so much better today.  I checked some tshirts and couldn't find anything. Stopping Keytruda and no prednisone due to the skin rash.  The joint pain is also gone.    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  No/denies              [facility] PT Assessment - 10/31/20 0001      AROM   Left Shoulder Flexion 160 Degrees    Left Shoulder ABduction 173 Degrees   some upper arm tightness   Left Shoulder External Rotation 85 Degrees                         [facility] Adult PT Treatment/Exercise - 10/31/20 0001      Shoulder Exercises: Pulleys   Flexion 2 minutes    Flexion Limitations with initial cueing    ABduction 2 minutes    ABduction Limitations with initial cueing      Shoulder Exercises: Therapy Ball   Flexion 5 reps;Both    ABduction 5 reps;Both      Manual Therapy   Edema Management gave pt self MLD instruction as well as instruction for friend who wants to help with massage but it not present.    Manual Lymphatic Drainage (MLD) short neck, superficial and deep abdominals, bil axillary nodes, sternal nodes, Lt inguinal nodes then anterior interaxillary pathway and focus on lateral trunk clearance for axillo inguinal pathway lateral trunk towards inguinal nodes and then Lt upper arm from proximal to distal and then reversing steps. Then posterior interaxillary work in  Rt sidelying                       PT Long Term Goals - 10/11/20 1529      PT LONG TERM GOAL #1   Title Pt will return to baseline shoulder ROM measurements post op to allow pt to return to PLOF.    Time 8    Period Months    Status On-going      PT LONG TERM GOAL #2   Title Pt will demonstrate 170 degrees of L shoulder flexion to allow her to reach overhead.    Baseline 113    Time 4    Period Weeks    Status New    Target Date 11/08/20      PT LONG TERM GOAL #3   Title Pt will demonstrate 170 degrees of L shoulder abduction to allow her to reach out to the side.    Baseline 96    Time 4    Period Weeks    Status New    Target Date 11/08/20      PT LONG TERM GOAL #4   Title Pt will report a 75% improvement in L scapular pain to allow improved comfort.    Time 4    Period Weeks     Status New    Target Date 11/08/20      PT LONG TERM GOAL #5   Title Pt will report a 75% improvement in L arm pain due to cording to allow improved comfort and mobility.    Time 4    Period Weeks    Status New    Target Date 11/08/20      Additional Long Term Goals   Additional Long Term Goals Yes      PT LONG TERM GOAL #6   Title Pt will be independent in a home exercise program for continued strengthening and stretching.    Time 4    Period Weeks    Status New    Target Date 11/08/20                 Plan - 10/31/20 1619    Clinical Impression Statement Pt is much improved today in terms of AROM and feeling minimal to no cording in the UE with movement.  Biggest issue now the edema in the left lateral trunk and some tightness in the upper pectoralis as well as lacking around 20deg of AROM into flexion and abduction.  Focused on MLD today for the abdomen and trunk with pt given instruction for massage therapist friend to help with MLD.  Pt has soft edema along the drain site which improves with MLD.    PT Frequency 2x / week    PT Duration 4 weeks    PT Treatment/Interventions ADLs/Self Care Home Management;Therapeutic exercise;Patient/family education;Therapeutic activities;Manual techniques;Manual lymph drainage;Compression bandaging;Scar mobilization;Passive range of motion;Taping;Vasopneumatic Device;Orthotic Fit/Training    PT Next Visit Plan make sure SOZO is scheduled. PROM to L shoulder, MLD to L chest/abdomen/trunk, advance TE as needed    Consulted and Agree with Plan of Care Patient           Patient will benefit from skilled therapeutic intervention in order to improve the following deficits and impairments:     Visit Diagnosis: Stiffness of left shoulder, not elsewhere classified  Aftercare following surgery for neoplasm  Pain in left arm  Malignant neoplasm of central portion of left female breast, unspecified estrogen receptor status  (  [place])  Abnormal posture     Problem List Patient Active Problem List   Diagnosis Date Noted  . S/P mastectomy, left 09/20/2020  . Genetic testing 08/03/2020  . Port-A-Cath in place 05/31/2020  . Cancer of central portion of left female breast ([place]) 03/10/2020    Stark Bray 10/31/2020, 4:24 PM  [place], [place], [address] Phone: [phone]   Fax:  [phone]  Name: JAPNEET STAGGS MRN: [id number] Date of Birth: 06/23/1981

## 2020-10-31 NOTE — Progress Notes (Signed)
Pt here for patient teaching.    Pt given Radiation and You booklet, skin care instructions, Alra deodorant and Radiaplex gel.    Reviewed areas of pertinence such as  fatigue, hair loss, skin changes,breast tenderness, breast swelling.   Pt able to give teach back of to pat skin and use unscented/gentle soap,apply Radiaplex bid, avoid applying anything to skin within 4 hours of treatment, avoid wearing an under wire bra and to use an electric razor if they must shave.   Pt demonstrated understanding of information given and will contact nursing with any questions or concerns.    [email or web address]

## 2020-11-01 ENCOUNTER — Other Ambulatory Visit: Payer: Self-pay

## 2020-11-01 ENCOUNTER — Ambulatory Visit
Admission: RE | Admit: 2020-11-01 | Discharge: 2020-11-01 | Disposition: A | Discharge status: Home / Self Care | Payer: 59 | Source: Ambulatory Visit | Attending: Radiation Oncology | Admitting: Radiation Oncology

## 2020-11-01 DIAGNOSIS — Z510 Encounter for antineoplastic radiation therapy: Secondary | ICD-10-CM | POA: Diagnosis not present

## 2020-11-02 ENCOUNTER — Ambulatory Visit
Admission: RE | Admit: 2020-11-02 | Discharge: 2020-11-02 | Disposition: A | Discharge status: Home / Self Care | Payer: 59 | Source: Ambulatory Visit | Attending: Radiation Oncology | Admitting: Radiation Oncology

## 2020-11-02 DIAGNOSIS — Z510 Encounter for antineoplastic radiation therapy: Secondary | ICD-10-CM | POA: Diagnosis not present

## 2020-11-03 ENCOUNTER — Inpatient Hospital Stay (HOSPITAL_BASED_OUTPATIENT_CLINIC_OR_DEPARTMENT_OTHER): Payer: 59 | Admitting: Hematology

## 2020-11-03 ENCOUNTER — Ambulatory Visit
Admission: RE | Admit: 2020-11-03 | Discharge: 2020-11-03 | Disposition: A | Discharge status: Home / Self Care | Payer: 59 | Source: Ambulatory Visit | Attending: Radiation Oncology | Admitting: Radiation Oncology

## 2020-11-03 ENCOUNTER — Encounter: Payer: Self-pay | Admitting: Hematology

## 2020-11-03 VITALS — BP 130/82 | HR 95 | Temp 97.1°F | Resp 18 | Wt 208.8 lb

## 2020-11-03 DIAGNOSIS — C50112 Malignant neoplasm of central portion of left female breast: Secondary | ICD-10-CM

## 2020-11-03 DIAGNOSIS — Z510 Encounter for antineoplastic radiation therapy: Secondary | ICD-10-CM | POA: Diagnosis not present

## 2020-11-06 ENCOUNTER — Other Ambulatory Visit: Payer: Self-pay

## 2020-11-06 ENCOUNTER — Ambulatory Visit
Admission: RE | Admit: 2020-11-06 | Discharge: 2020-11-06 | Disposition: A | Discharge status: Home / Self Care | Payer: 59 | Source: Ambulatory Visit | Attending: Radiation Oncology | Admitting: Radiation Oncology

## 2020-11-06 ENCOUNTER — Encounter: Payer: Self-pay | Admitting: *Deleted

## 2020-11-06 ENCOUNTER — Ambulatory Visit: Payer: 59 | Admitting: Hematology

## 2020-11-06 ENCOUNTER — Inpatient Hospital Stay: Payer: 59 | Attending: Hematology

## 2020-11-06 DIAGNOSIS — C50112 Malignant neoplasm of central portion of left female breast: Secondary | ICD-10-CM | POA: Insufficient documentation

## 2020-11-06 DIAGNOSIS — Z510 Encounter for antineoplastic radiation therapy: Secondary | ICD-10-CM | POA: Insufficient documentation

## 2020-11-06 DIAGNOSIS — Z9012 Acquired absence of left breast and nipple: Secondary | ICD-10-CM | POA: Insufficient documentation

## 2020-11-06 DIAGNOSIS — I10 Essential (primary) hypertension: Secondary | ICD-10-CM | POA: Diagnosis not present

## 2020-11-06 DIAGNOSIS — K219 Gastro-esophageal reflux disease without esophagitis: Secondary | ICD-10-CM | POA: Diagnosis not present

## 2020-11-06 DIAGNOSIS — C773 Secondary and unspecified malignant neoplasm of axilla and upper limb lymph nodes: Secondary | ICD-10-CM | POA: Diagnosis not present

## 2020-11-06 DIAGNOSIS — R21 Rash and other nonspecific skin eruption: Secondary | ICD-10-CM | POA: Diagnosis not present

## 2020-11-06 DIAGNOSIS — Z9221 Personal history of antineoplastic chemotherapy: Secondary | ICD-10-CM | POA: Diagnosis not present

## 2020-11-06 DIAGNOSIS — Z8616 Personal history of COVID-19: Secondary | ICD-10-CM | POA: Insufficient documentation

## 2020-11-06 DIAGNOSIS — M545 Low back pain, unspecified: Secondary | ICD-10-CM | POA: Insufficient documentation

## 2020-11-06 DIAGNOSIS — Z171 Estrogen receptor negative status [ER-]: Secondary | ICD-10-CM | POA: Insufficient documentation

## 2020-11-06 DIAGNOSIS — Z95828 Presence of other vascular implants and grafts: Secondary | ICD-10-CM

## 2020-11-06 DIAGNOSIS — Z923 Personal history of irradiation: Secondary | ICD-10-CM | POA: Diagnosis not present

## 2020-11-06 DIAGNOSIS — R232 Flushing: Secondary | ICD-10-CM | POA: Diagnosis not present

## 2020-11-06 DIAGNOSIS — Z79899 Other long term (current) drug therapy: Secondary | ICD-10-CM | POA: Insufficient documentation

## 2020-11-06 DIAGNOSIS — F418 Other specified anxiety disorders: Secondary | ICD-10-CM | POA: Diagnosis not present

## 2020-11-06 LAB — CMP (CANCER CENTER ONLY)
ALT: 24 U/L (ref 0–44)
AST: 18 U/L (ref 15–41)
Albumin: 3.7 g/dL (ref 3.5–5.0)
Alkaline Phosphatase: 153 U/L — ABNORMAL HIGH (ref 38–126)
Anion gap: 9 (ref 5–15)
BUN: 15 mg/dL (ref 6–20)
CO2: 25 mmol/L (ref 22–32)
Calcium: 9.2 mg/dL (ref 8.9–10.3)
Chloride: 104 mmol/L (ref 98–111)
Creatinine: 0.75 mg/dL (ref 0.44–1.00)
GFR, Estimated: >60 mL/min (ref >60)
Glucose, Bld: 105 mg/dL — ABNORMAL HIGH (ref 70–99)
Potassium: 4.1 mmol/L (ref 3.5–5.1)
Sodium: 138 mmol/L (ref 135–145)
Total Bilirubin: 0.3 mg/dL (ref 0.3–1.2)
Total Protein: 7.1 g/dL (ref 6.5–8.1)

## 2020-11-06 LAB — CBC WITH DIFFERENTIAL (CANCER CENTER ONLY)
Abs Immature Granulocytes: 0.12 10*3/uL — ABNORMAL HIGH (ref 0.00–0.07)
Basophils Absolute: 0 10*3/uL (ref 0.0–0.1)
Basophils Relative: 0 %
Eosinophils Absolute: 0 10*3/uL (ref 0.0–0.5)
Eosinophils Relative: 0 %
HCT: 35.6 % — ABNORMAL LOW (ref 36.0–46.0)
Hemoglobin: 11.4 g/dL — ABNORMAL LOW (ref 12.0–15.0)
Immature Granulocytes: 1 %
Lymphocytes Relative: 6 %
Lymphs Abs: 0.6 10*3/uL — ABNORMAL LOW (ref 0.7–4.0)
MCH: 29.5 pg (ref 26.0–34.0)
MCHC: 32 g/dL (ref 30.0–36.0)
MCV: 92.2 fL (ref 80.0–100.0)
Monocytes Absolute: 0.5 10*3/uL (ref 0.1–1.0)
Monocytes Relative: 5 %
Neutro Abs: 7.7 10*3/uL (ref 1.7–7.7)
Neutrophils Relative %: 88 %
Platelet Count: 318 10*3/uL (ref 150–400)
RBC: 3.86 MIL/uL — ABNORMAL LOW (ref 3.87–5.11)
RDW: 14.3 % (ref 11.5–15.5)
WBC Count: 8.9 10*3/uL (ref 4.0–10.5)
nRBC: 0 % (ref 0.0–0.2)

## 2020-11-06 MED ORDER — HEPARIN SOD (PORK) LOCK FLUSH 100 UNIT/ML IV SOLN
500.0000 [IU] | Freq: Once | INTRAVENOUS | Status: AC
  Administered 2020-11-06: 500 [IU]
  Filled 2020-11-06: qty 5

## 2020-11-06 MED ORDER — SODIUM CHLORIDE 0.9% FLUSH
10.0000 mL | Freq: Once | INTRAVENOUS | Status: AC
  Administered 2020-11-06: 10 mL
  Filled 2020-11-06: qty 10

## 2020-11-07 ENCOUNTER — Ambulatory Visit
Admission: RE | Admit: 2020-11-07 | Discharge: 2020-11-07 | Disposition: A | Discharge status: Home / Self Care | Payer: 59 | Source: Ambulatory Visit | Attending: Radiation Oncology | Admitting: Radiation Oncology

## 2020-11-07 ENCOUNTER — Ambulatory Visit: Payer: 59 | Admitting: Rehabilitation

## 2020-11-07 DIAGNOSIS — C50112 Malignant neoplasm of central portion of left female breast: Secondary | ICD-10-CM | POA: Diagnosis not present

## 2020-11-08 ENCOUNTER — Ambulatory Visit
Admission: RE | Admit: 2020-11-08 | Discharge: 2020-11-08 | Disposition: A | Discharge status: Home / Self Care | Payer: 59 | Source: Ambulatory Visit | Attending: Radiation Oncology | Admitting: Radiation Oncology

## 2020-11-08 ENCOUNTER — Other Ambulatory Visit: Payer: Self-pay

## 2020-11-08 DIAGNOSIS — C50112 Malignant neoplasm of central portion of left female breast: Secondary | ICD-10-CM | POA: Diagnosis not present

## 2020-11-09 ENCOUNTER — Encounter: Payer: Self-pay | Admitting: Hematology

## 2020-11-09 ENCOUNTER — Inpatient Hospital Stay (HOSPITAL_BASED_OUTPATIENT_CLINIC_OR_DEPARTMENT_OTHER): Payer: 59 | Admitting: Hematology

## 2020-11-09 ENCOUNTER — Ambulatory Visit
Admission: RE | Admit: 2020-11-09 | Discharge: 2020-11-09 | Disposition: A | Discharge status: Home / Self Care | Payer: 59 | Source: Ambulatory Visit | Attending: Radiation Oncology | Admitting: Radiation Oncology

## 2020-11-09 ENCOUNTER — Ambulatory Visit: Payer: 59 | Attending: Hematology | Admitting: Rehabilitation

## 2020-11-09 VITALS — BP 137/93 | HR 103 | Temp 97.5°F | Resp 18 | Ht 64.0 in | Wt 210.3 lb

## 2020-11-09 DIAGNOSIS — C50112 Malignant neoplasm of central portion of left female breast: Secondary | ICD-10-CM

## 2020-11-09 NOTE — Progress Notes (Signed)
[place]   [phone] [phone]) [phone]   Clinic Follow up Note   Patient Care Team: [name], MD as PCP - General (Family Medicine) Mauro Kaufmann, RN as Oncology Nurse Navigator Rockwell Germany, RN as Oncology Nurse Navigator [name], MD as Consulting Physician (General Surgery) Truitt Merle, MD as Consulting Physician (Hematology) Gery Pray, MD as Consulting Physician (Radiation Oncology)  Date of Service:  11/09/2020  CHIEF COMPLAINT: f/u of left breast cancer, symptom management  SUMMARY OF ONCOLOGIC HISTORY: Oncology History Overview Note  Cancer Staging Cancer of central portion of left female breast [facility]) Staging form: Breast, AJCC 8th Edition - Clinical: No stage assigned - Unsigned    Cancer of central portion of left female breast ([place])  02/23/2020 Mammogram   Diagnostic Mammogram 02/23/20  IMPRESSION The 2x1x2.6cm irregular mass in teh left breast at 12:00 posiiton middle depth is highly suspicious of malignancy. An [place] is recommended for further evaluation and biopsy planning purposes.   02/23/2020 Breast US   [name] 02/23/20  IMPRESSION 2 adjacent spiculated masses in the left brast at 12:00 position 3 cm from the nipple (2.1x0.9x1.1cm and 1.1x1.4x0.5cm) is suggestive of malignancy.   Multiple abnormal left subpectoral and left axillary nodes measuring 3.3x2.1 cm concerning for metastatic adenopathy.    Left breast skin thickening and edema may be secondary congestive edema due to extensive axillary adenopathy.    03/08/2020 Initial Biopsy   Diagnosis 1.Breast, left, needle core biopsy, 12:00 position, 3cmfn -INVASIVE DUCTAL CARCINOMA -SEE COMMENT  2. Lymph node, needle/core biopsy, left axilla -METASTATIC CARCINOMA INVOLVING A LYMPH NODE  -LYMPHOVASCULAR SPACE INVASION PRESENT   Microscopic Comment  1.Based on the biopsy the carcinoma appears Nottingham Grade 3 or 3 and measures 1 cm in the  greatest linear extent.    03/08/2020 Receptors her2   ER- Negative 0% PR - Negative 0% HER2 - Negative  KI 67 - 80%    03/08/2020 Cancer Staging   Staging form: Breast, AJCC 8th Edition - Clinical stage from 03/08/2020: Stage IIIB (cT2, cN1, cM0, G3, ER-, PR-, HER2-) - Signed by Truitt Merle, MD on 03/10/2020   03/10/2020 Initial Diagnosis   Cancer of central portion of left female breast ([place])   03/16/2020 Breast MRI   IMPRESSION: 1. 8.1 x 7.8 x 6.6 cm biopsy proven invasive ductal carcinoma in the central right breast, involving 3 quadrants. 2. 3.0 x 1.7 x 1.1 cm satellite mass more inferiorly in lower inner quadrant of the left breast, compatible with additional malignancy. 3. Metastatic level 1 and level 2 left axillary lymph nodes. 4. No evidence of malignancy on the right.   03/17/2020 Imaging   IMPRESSION: CT CAP w contrast  1. Diffuse skin thickening in the left breast with left axillary and subpectoral lymphadenopathy, as well as a mildly enlarged left supraclavicular lymph node, which likely represents metastatic lymphadenopathy. No other definite extra nodal metastatic disease noted elsewhere in the chest, abdomen or pelvis. 2. Large mass in the central anatomic pelvis which is of uncertain origin, potentially a large exophytic fibroid or a large solid mass arising from the right ovary. Further evaluation with pelvic ultrasound is strongly recommended.   03/21/2020 Imaging   Bone Scan  IMPRESSION: Apparent arthropathy at L5. No bony metastatic disease is demonstrable on this study. Scattered foci of abnormal uptake in a pelvic mass is of uncertain etiology given absence of calcification in this mass by CT. This mass compresses the urinary bladder. It  is possible that some of the increased uptake in this area actually represents physiologic uptake within the bladder.   Kidneys noted in flank positions bilaterally.     03/22/2020 - 08/16/2020 Neo-Adjuvant Chemotherapy    Neoadjuvant Adriamycin and Cytoxan q2weeks for 4 cycles starting 03/22/20-05/03/20 followed by weekly Taxol and Carboplatin for 12 weeks starting 05/17/20-08/16/20   03/24/2020 Imaging   US Pelvis  IMPRESSION: 1. Large pedunculated lesion directly contiguous with the uterine most suggestive of a large subserosal fibroid which measures up to 8.2 cm. 2. Additional 1 cm probable intramural fibroid in the right anterior uterine body. 3. No other acute or worrisome pelvic abnormality.   03/29/2020 Genetic Testing   Negative genetic testing on the common hereditary cancer panel.  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  The Common Hereditary Gene Panel offered by [facility] includes sequencing and/or deletion duplication testing of the following 47 genes: APC, ATM, AXIN2, BARD1, BMPR1A, BRCA1, BRCA2, BRIP1, CDH1, CDK4, CDKN2A (p14ARF), CDKN2A (p16INK4a), CHEK2, CTNNA1, DICER1, EPCAM (Deletion/duplication testing only), GREM1 (promoter region deletion/duplication testing only), KIT, MEN1, MLH1, MSH2, MSH3, MSH6, MUTYH, NBN, NF1, NHTL1, PALB2, PDGFRA, PMS2, POLD1, POLE, PTEN, RAD50, RAD51C, RAD51D, SDHB, SDHC, SDHD, SMAD4, SMARCA4. STK11, TP53, TSC1, TSC2, and VHL.  The following genes were evaluated for sequence changes only: SDHA and HOXB13 c.251G>A variant only. The report date is 03/29/2020   04/28/2020 - 10/27/2020 Antibody Plan   Added Keytruda q3weeks starting 04/28/20 to complete 1 year of treatment. Held since 10/27/20 due to skin rash and body aches.    08/17/2020 Breast MRI   IMPRESSION: 1. The biopsy proven malignancy in the left breast and the adjacent satellite lesion have resolved in the interval. No abnormalities in these locations today. 2. No MRI evidence of malignancy in the right breast. 3. No adenopathy identified today   09/20/2020 Surgery   LEFT MODIFIED RADICAL MASTECTOMY by Dr Donne Hazel   09/20/2020 Pathology Results   FINAL MICROSCOPIC DIAGNOSIS:   A. BREAST, LEFT, MODIFIED RADICAL  MASTECTOMY:  - No residual invasive carcinoma in breast status post neoadjuvant  treatment.  - Metastatic carcinoma in (2) of (14) lymph nodes.  - Biopsy sites (one in breast, one in one of the positive lymph nodes).  - See oncology table.   B. ADDITIONAL AXILLARY CONTENTS, LEFT, DISSECTION:  - Metastatic carcinoma in (5) of (5) lymph nodes.    09/20/2020 Cancer Staging   Staging form: Breast, AJCC 8th Edition - Pathologic stage from 09/20/2020: No Stage Recommended (ypT0, pN2a, cM0, G3, ER-, PR-, HER2-) - Signed by Gardenia Phlegm, NP on 10/04/2020 Stage prefix: Post-therapy Histologic grading system: 3 grade system   10/30/2020 -  Radiation Therapy   Adjuvant Radiation with Dr Sondra Come starting 10/30/20   10/30/2020 -  Chemotherapy   Adjuvant Xeloda starting 10/30/20 at 1557m BID M-F while on RT.       CURRENT THERAPY:  -Added Keytruda q3weekson10/22/21 to complete 1 year of treatment.Held since 10/27/20 due to skin rash and body aches. -Adjuvant Radiationwith Dr KSondra Comestarting 10/30/20 -Adjuvant Xelodastarting 10/30/20 at1503mBID M-Fwhile on RT.  INTERVAL HISTORY:  Caitlyn FORRESTs here for a symptom management. She was last seen by me on 11/03/20. She presents to the clinic alone.  She is seen today for worsening rash on her upper chest. It developed welt-like bumps under the skin. It remains isolated to the upper chest. She notes she is trying to keep it uncovered at home and stay out of the sun. She  reports fatigue and shortness of breath from the Xeloda. She denies chest pain.   All other systems were reviewed with the patient and are negative.  MEDICAL HISTORY:  Past Medical History:  Diagnosis Date  . Anemia   . Anxiety   . Arthritis   . Cancer (HCC)    Left Breast  . Depression   . GERD (gastroesophageal reflux disease)   . Headache   . Panic attack     SURGICAL HISTORY: Past Surgical History:  Procedure Laterality Date  . MASTECTOMY MODIFIED  RADICAL Left 09/20/2020   Procedure: LEFT MODIFIED RADICAL MASTECTOMY;  Surgeon: [name], MD;  Location: [place];  Service: General;  Laterality: Left;  RNFA; PEC BLOCK;  . PORTACATH PLACEMENT Right 03/21/2020   Procedure: INSERTION PORT-A-CATH WITH ULTRASOUND GUIDANCE;  Surgeon: [name], MD;  Location: [place];  Service: General;  Laterality: Right;    I have reviewed the social history and family history with the patient and they are unchanged from previous note.  ALLERGIES:  is allergic to medroxyprogesterone.  MEDICATIONS:  Current Outpatient Medications  Medication Sig Dispense Refill  . ALPRAZolam (XANAX) 1 MG tablet Take 1 mg by mouth 3 (three) times daily as needed for anxiety.     Marland Kitchen azithromycin (ZITHROMAX) 250 MG tablet Take as prescribed 6 each 0  . baclofen (LIORESAL) 10 MG tablet Take 1 tablet (10 mg total) by mouth 2 (two) times daily as needed for muscle spasms. 30 each 0  . capecitabine (XELODA) 500 MG tablet Take 3 tablets (1,500 mg total) by mouth 2 (two) times daily after a meal. Take only on days of radiation Monday through Friday. 90 tablet 1  . ciprofloxacin (CIPRO) 500 MG tablet Take 1 tablet (500 mg total) by mouth 2 (two) times daily. 10 tablet 0  . citalopram (CELEXA) 20 MG tablet Take 20 mg by mouth daily.    Marland Kitchen dexamethasone (DECADRON) 4 MG tablet Take 2 tablets by mouth daily starting the day after Carboplatin and Cytoxan x 3 days. Take with food. 30 tablet 1  . diphenhydrAMINE (BENADRYL) 25 mg capsule Take 25 mg by mouth every 6 (six) hours as needed for allergies.    . diphenhydrAMINE HCl (ZZZQUIL) 50 MG/30ML LIQD Take 30 mLs by mouth at bedtime as needed (sleep).    Marland Kitchen doxycycline (VIBRA-TABS) 100 MG tablet Take 1 tablet (100 mg total) by mouth 2 (two) times daily. 14 tablet 0  . fluconazole (DIFLUCAN) 150 MG tablet Take 1 tablet for a yeast infection if needed, may repeat in 3 days if needed Use if needed while on antibiotic. 2  tablet 0  . fluticasone (FLONASE) 50 MCG/ACT nasal spray Place 2 sprays into both nostrils daily as needed for allergies or rhinitis.    . furosemide (LASIX) 20 MG tablet Take 1 tablet (20 mg total) by mouth daily as needed for edema. 10 tablet 0  . gabapentin (NEURONTIN) 100 MG capsule Take 1 capsule (100 mg total) by mouth 3 (three) times daily. May increase to 398m three times daily as need in a few weeks if tolerates well (Patient taking differently: Take 100 mg by mouth 3 (three) times daily as needed (hot flashes). May increase to 3061mthree times daily as need in a few weeks if tolerates well) 90 capsule 0  . ibuprofen (ADVIL) 200 MG tablet Take 800 mg by mouth every 8 (eight) hours as needed for moderate pain.    . Marland Kitchenidocaine-prilocaine (EMLA) cream Apply 1  application topically as needed (port access). Apply to affected area once 30 g 3  . magic mouthwash w/lidocaine SOLN Take 5 mLs by mouth 4 (four) times daily as needed for mouth pain. 480 mL 1  . methocarbamol (ROBAXIN) 500 MG tablet Take 1 tablet (500 mg total) by mouth every 6 (six) hours as needed for muscle spasms. 30 tablet 2  . methylPREDNISolone (MEDROL DOSEPAK) 4 MG TBPK tablet Take 1 tablet (4 mg total) by mouth taper from 4 doses each day to 1 dose and stop. 1 each 0  . metroNIDAZOLE (METROGEL) 0.75 % vaginal gel Place 1 Applicatorful vaginally 2 (two) times daily. 70 g 1  . ondansetron (ZOFRAN) 8 MG tablet Take 1 tablet (8 mg total) by mouth 2 (two) times daily as needed. Start on the third day after chemotherapy. 30 tablet 2  . predniSONE (DELTASONE) 10 MG tablet Take 1 tablet (10 mg total) by mouth daily with breakfast. 60 tablet 0  . prochlorperazine (COMPAZINE) 10 MG tablet Take 1 tablet (10 mg total) by mouth every 6 (six) hours as needed (Nausea or vomiting). 30 tablet 2  . traMADol (ULTRAM) 50 MG tablet Take 1 tablet (50 mg total) by mouth daily as needed for severe pain (headache). 30 tablet 0  . triamcinolone ointment  (KENALOG) 0.5 % Apply 1 application topically 2 (two) times daily. 30 g 2  . zolpidem (AMBIEN) 10 MG tablet Take 10 mg by mouth at bedtime as needed for sleep.     No current facility-administered medications for this visit.   Facility-Administered Medications Ordered in Other Visits  Medication Dose Route Frequency Provider Last Rate Last Admin  . sodium chloride flush (NS) 0.9 % injection 10 mL  10 mL Intravenous PRN Alla Feeling, NP   10 mL at 03/28/20 1104    PHYSICAL EXAMINATION: ECOG PERFORMANCE STATUS: 1 - Symptomatic but completely ambulatory  Vitals:   11/09/20 1124  BP: (!) 137/93  Pulse: (!) 103  Resp: 18  Temp: (!) 97.5 F (36.4 C)  SpO2: 100%   Filed Weights   11/09/20 1124  Weight: 210 lb 4.8 oz (95.4 kg)    Due to COVID19 we will limit examination to appearance. Patient had no complaints.  GENERAL:alert, no distress and comfortable SKIN: skin color normal, rash to upper chest EYES: normal, Conjunctiva are pink and non-injected, sclera clear  NEURO: alert & oriented x 3 with fluent speech  LABORATORY DATA:  I have reviewed the data as listed CBC Latest Ref Rng & Units 11/06/2020 10/27/2020 10/04/2020  WBC 4.0 - 10.5 K/uL 8.9 5.3 7.2  Hemoglobin 12.0 - 15.0 g/dL 11.4(L) 11.6(L) 11.5(L)  Hematocrit 36.0 - 46.0 % 35.6(L) 37.1 35.4(L)  Platelets 150 - 400 K/uL 318 326 384     CMP Latest Ref Rng & Units 11/06/2020 10/27/2020 10/04/2020  Glucose 70 - 99 mg/dL 105(H) 95 136(H)  BUN 6 - 20 mg/dL 15 15 16   Creatinine 0.44 - 1.00 mg/dL 0.75 0.76 0.83  Sodium 135 - 145 mmol/L 138 140 141  Potassium 3.5 - 5.1 mmol/L 4.1 4.0 3.8  Chloride 98 - 111 mmol/L 104 103 102  CO2 22 - 32 mmol/L 25 27 25   Calcium 8.9 - 10.3 mg/dL 9.2 9.8 9.4  Total Protein 6.5 - 8.1 g/dL 7.1 7.5 8.0  Total Bilirubin 0.3 - 1.2 mg/dL 0.3 0.5 0.3  Alkaline Phos 38 - 126 U/L 153(H) 170(H) 184(H)  AST 15 - 41 U/L 18 28 26   ALT 0 -  44 U/L 24 33 31      RADIOGRAPHIC STUDIES: I have personally  reviewed the radiological images as listed and agreed with the findings in the report. No results found.   ASSESSMENT & PLAN:  Caitlyn Hendricks is a 40 y.o. female with   1. Skin rashes on front upper chest, body aches -She has completeddoxycycline 100 mg twice dailyfor a week wo any improvement, skin rash continues.  -I discussed this ispossiblean autoimmune response from [place]. We have held [place] since 10/27/20.  -I started her on prednisone taper over 3-4weeks to manage her rash and joint aches on 10/27/20.  -After first dose prednisone, her skin rash has started to clear up and body pain resolved -when she taped prednisone down to 21m daily, her rash has worsened. She stopped the prednisone two days ago due to the worsening of the rash. Her joint pain is resolved. -I will increase her prednisone to  490mdaily fo 3 days to see if her rash improves and recommend she use the steroid cream twice a day. -I offered her a referral to dermatology for further work up. She would like to put this off for now. She will contact usKoreaf the rash worsens, and I will send referral at that time.  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 Her 03/08/20 biopsy showedshe has grade III invasive ductal carcinoma of left breast metastatic to her left axillary LN. Her ER/PR/HER2 markers were all negative.CT CAP/Bone scan negative for distant mets. -To downstage disease and reduce risk of recurrence, she completed neoadjuvant chemo with ddAC q2weeksfor 4 cycles9/15/21-10/27/21. Followed by weekly carbo/taxolfor 12 weeks 05/17/20-2/9/22before proceeding with surgery. -Based onrecently publish [employer] 522 trial  data,I added Keytruda q3weeks for 1 year treatment on 04/28/20.Tolerating well. -She underwent left mastectomy with Dr WaDonne Hazeln 09/20/20. Surgical path showsNo residual invasive carcinoma in breast status post neoadjuvant treatment,however has 7/19 positive LN. I discussed with this many positive LNs, she has veryhigh risk of cancer recurrence. -I started her on adjuvant chemotherapy with oral Xeloda for 6 months to reduce her risk of cancer recurrence. She will take with RT at lower dose 150037mID M-F, otherwise will continue 2 weeks on/1 week off.Started Xeloda on 10/30/20.  -She started djuvant radiation on 10/30/20 with Dr KinSondra ComeTolerating Xeloda and Radiation well so far. Will continue.  -She has responded to Prednisone, off Keytruda. Will continue to hold Keytruda. Will re-evaluate after radiation. I also discussed option of full body scan later this year.  -F/u on 11/20/20.   3. Low back pain -She has hadnew onset low back pain. Her MRI Lumbar spine from 10/26/20 showedProgressive anterolisthesis and facet degeneration at L5-S1. Mild subarticular and foraminal stenosis bilaterally. -Will monitor.Will refer to orthopedics if gets worse  4. Genetic Testingnegative for pathogenetic mutations with VUS of POLE gene  5. Anxiety/Depression, Social Support  -She is on Xanax 1mg6mD and Celexa40mg20m low dose Ambien for sleep. -She has very good social supportfrom mother and boyfriend.She owns a dog gEngineer, maintenanceness -Mood is stable.  6. Hot flashes -I discussed chemo will put her in perimenopause, so symptoms of hot flashes can happen. Last full period with start of chemo(03/2020) -She is on Celexa 40mg 31mently.She is also onGabapentin 100mg.H93mlashes improved off chemotherapy.  7.COVID (+)  on 08/02/20 with symptoms of Hoarsenessand ribcage pain. She has recovered quickly and completely   PLAN: -restart prednisone at 32m daily for 3 days to see if  her rash improves then slowly taper if it works -Continue radiation -Continue Xeloda 15049mBID, M-F with concurrent radiation  -Flush and labs on 11/20/20. She will contact usKoreaf she needs dermatology referral    No problem-specific Assessment & Plan notes found for this encounter.   No orders of the defined types were placed in this encounter.  All questions were answered. The patient knows to call the clinic with any problems, questions or concerns. No barriers to learning was detected. The total time spent in the appointment was 20 minutes.     YaTruitt MerleMD 11/09/2020   I, KaWilburn Mylaram acting as scribe for YaTruitt MerleMD.   I have reviewed the above documentation for accuracy and completeness, and I agree with the above.

## 2020-11-10 ENCOUNTER — Other Ambulatory Visit: Payer: Self-pay

## 2020-11-10 ENCOUNTER — Ambulatory Visit
Admission: RE | Admit: 2020-11-10 | Discharge: 2020-11-10 | Disposition: A | Discharge status: Home / Self Care | Payer: 59 | Source: Ambulatory Visit | Attending: Radiation Oncology | Admitting: Radiation Oncology

## 2020-11-10 DIAGNOSIS — C50112 Malignant neoplasm of central portion of left female breast: Secondary | ICD-10-CM | POA: Diagnosis not present

## 2020-11-13 ENCOUNTER — Other Ambulatory Visit (HOSPITAL_COMMUNITY): Payer: Self-pay

## 2020-11-13 ENCOUNTER — Other Ambulatory Visit: Payer: Self-pay

## 2020-11-13 ENCOUNTER — Ambulatory Visit
Admission: RE | Admit: 2020-11-13 | Discharge: 2020-11-13 | Disposition: A | Discharge status: Home / Self Care | Payer: 59 | Source: Ambulatory Visit | Attending: Radiation Oncology | Admitting: Radiation Oncology

## 2020-11-13 ENCOUNTER — Inpatient Hospital Stay: Payer: 59

## 2020-11-13 DIAGNOSIS — Z95828 Presence of other vascular implants and grafts: Secondary | ICD-10-CM

## 2020-11-13 DIAGNOSIS — Z171 Estrogen receptor negative status [ER-]: Secondary | ICD-10-CM

## 2020-11-13 DIAGNOSIS — C50112 Malignant neoplasm of central portion of left female breast: Secondary | ICD-10-CM | POA: Diagnosis not present

## 2020-11-13 LAB — CBC WITH DIFFERENTIAL (CANCER CENTER ONLY)
Abs Immature Granulocytes: 0.05 10*3/uL (ref 0.00–0.07)
Basophils Absolute: 0 10*3/uL (ref 0.0–0.1)
Basophils Relative: 0 %
Eosinophils Absolute: 0 10*3/uL (ref 0.0–0.5)
Eosinophils Relative: 0 %
HCT: 36.9 % (ref 36.0–46.0)
Hemoglobin: 11.9 g/dL — ABNORMAL LOW (ref 12.0–15.0)
Immature Granulocytes: 1 %
Lymphocytes Relative: 4 %
Lymphs Abs: 0.4 10*3/uL — ABNORMAL LOW (ref 0.7–4.0)
MCH: 29.8 pg (ref 26.0–34.0)
MCHC: 32.2 g/dL (ref 30.0–36.0)
MCV: 92.3 fL (ref 80.0–100.0)
Monocytes Absolute: 0.2 10*3/uL (ref 0.1–1.0)
Monocytes Relative: 2 %
Neutro Abs: 9.3 10*3/uL — ABNORMAL HIGH (ref 1.7–7.7)
Neutrophils Relative %: 93 %
Platelet Count: 275 10*3/uL (ref 150–400)
RBC: 4 MIL/uL (ref 3.87–5.11)
RDW: 15.6 % — ABNORMAL HIGH (ref 11.5–15.5)
WBC Count: 10 10*3/uL (ref 4.0–10.5)
nRBC: 0 % (ref 0.0–0.2)

## 2020-11-13 LAB — CMP (CANCER CENTER ONLY)
ALT: 27 U/L (ref 0–44)
AST: 26 U/L (ref 15–41)
Albumin: 4 g/dL (ref 3.5–5.0)
Alkaline Phosphatase: 161 U/L — ABNORMAL HIGH (ref 38–126)
Anion gap: 14 (ref 5–15)
BUN: 19 mg/dL (ref 6–20)
CO2: 24 mmol/L (ref 22–32)
Calcium: 9.8 mg/dL (ref 8.9–10.3)
Chloride: 100 mmol/L (ref 98–111)
Creatinine: 0.98 mg/dL (ref 0.44–1.00)
GFR, Estimated: >60 mL/min (ref >60)
Glucose, Bld: 121 mg/dL — ABNORMAL HIGH (ref 70–99)
Potassium: 4.3 mmol/L (ref 3.5–5.1)
Sodium: 138 mmol/L (ref 135–145)
Total Bilirubin: 0.4 mg/dL (ref 0.3–1.2)
Total Protein: 7.6 g/dL (ref 6.5–8.1)

## 2020-11-13 MED ORDER — HEPARIN SOD (PORK) LOCK FLUSH 100 UNIT/ML IV SOLN
500.0000 [IU] | Freq: Once | INTRAVENOUS | Status: AC
  Administered 2020-11-13: 500 [IU]
  Filled 2020-11-13: qty 5

## 2020-11-13 MED ORDER — SODIUM CHLORIDE 0.9% FLUSH
10.0000 mL | Freq: Once | INTRAVENOUS | Status: AC
Start: 2020-11-13 — End: 2020-11-13
  Administered 2020-11-13: 10 mL
  Filled 2020-11-13: qty 10

## 2020-11-14 ENCOUNTER — Ambulatory Visit
Admission: RE | Admit: 2020-11-14 | Discharge: 2020-11-14 | Disposition: A | Discharge status: Home / Self Care | Payer: 59 | Source: Ambulatory Visit | Attending: Radiation Oncology | Admitting: Radiation Oncology

## 2020-11-14 ENCOUNTER — Ambulatory Visit: Payer: 59 | Admitting: Rehabilitation

## 2020-11-14 DIAGNOSIS — C50112 Malignant neoplasm of central portion of left female breast: Secondary | ICD-10-CM | POA: Diagnosis not present

## 2020-11-15 ENCOUNTER — Telehealth: Payer: Self-pay

## 2020-11-15 ENCOUNTER — Ambulatory Visit
Admission: RE | Admit: 2020-11-15 | Discharge: 2020-11-15 | Disposition: A | Discharge status: Home / Self Care | Payer: 59 | Source: Ambulatory Visit | Attending: Radiation Oncology | Admitting: Radiation Oncology

## 2020-11-15 DIAGNOSIS — C50112 Malignant neoplasm of central portion of left female breast: Secondary | ICD-10-CM | POA: Diagnosis not present

## 2020-11-15 DIAGNOSIS — Z171 Estrogen receptor negative status [ER-]: Secondary | ICD-10-CM

## 2020-11-15 DIAGNOSIS — R21 Rash and other nonspecific skin eruption: Secondary | ICD-10-CM

## 2020-11-15 NOTE — Telephone Encounter (Signed)
Caitlyn Hendricks called stating her rash is getting worse it has spread to her scalp and abdomen and down her back.  She also state her throat feels itchy, not sore.  She has been taking prednisone 30mg  for the past 3 days.  Antihistamines do not help the pruritis.  Reviewed with Dr [name]. Urgent Dermatology referral placed.

## 2020-11-15 NOTE — Telephone Encounter (Signed)
Caitlyn Hendricks is in agreement to see a dermatologist.  She requested on in [place].  I left a message with the new pt referral coordinator ant [facility] and [place].  I have also faxed the referral, demographics, and last ov notes to [phone].

## 2020-11-15 NOTE — Progress Notes (Signed)
[place]   [phone] [phone]   Clinic Follow up Note   Patient Care Team: [name], MD as PCP - General (Family Medicine) Mauro Kaufmann, RN as Oncology Nurse Navigator Rockwell Germany, RN as Oncology Nurse Navigator [name], MD as Consulting Physician (General Surgery) Truitt Merle, MD as Consulting Physician (Hematology) Gery Pray, MD as Consulting Physician (Radiation Oncology)  Date of Service:  11/20/2020  CHIEF COMPLAINT: f/u of left breast cancer, symptom management  SUMMARY OF ONCOLOGIC HISTORY: Oncology History Overview Note  Cancer Staging Cancer of central portion of left female breast [facility]) Staging form: Breast, AJCC 8th Edition - Clinical: No stage assigned - Unsigned    Cancer of central portion of left female breast ([place])  02/23/2020 Mammogram   Diagnostic Mammogram 02/23/20  IMPRESSION The 2x1x2.6cm irregular mass in teh left breast at 12:00 posiiton middle depth is highly suspicious of malignancy. An [place] is recommended for further evaluation and biopsy planning purposes.   02/23/2020 Breast US   [name] 02/23/20  IMPRESSION 2 adjacent spiculated masses in the left brast at 12:00 position 3 cm from the nipple (2.1x0.9x1.1cm and 1.1x1.4x0.5cm) is suggestive of malignancy.   Multiple abnormal left subpectoral and left axillary nodes measuring 3.3x2.1 cm concerning for metastatic adenopathy.    Left breast skin thickening and edema may be secondary congestive edema due to extensive axillary adenopathy.    03/08/2020 Initial Biopsy   Diagnosis 1.Breast, left, needle core biopsy, 12:00 position, 3cmfn -INVASIVE DUCTAL CARCINOMA -SEE COMMENT  2. Lymph node, needle/core biopsy, left axilla -METASTATIC CARCINOMA INVOLVING A LYMPH NODE  -LYMPHOVASCULAR SPACE INVASION PRESENT   Microscopic Comment  1.Based on the biopsy the carcinoma appears Nottingham Grade 3 or 3 and measures 1 cm in the  greatest linear extent.    03/08/2020 Receptors her2   ER- Negative 0% PR - Negative 0% HER2 - Negative  KI 67 - 80%    03/08/2020 Cancer Staging   Staging form: Breast, AJCC 8th Edition - Clinical stage from 03/08/2020: Stage IIIB (cT2, cN1, cM0, G3, ER-, PR-, HER2-) - Signed by Truitt Merle, MD on 03/10/2020   03/10/2020 Initial Diagnosis   Cancer of central portion of left female breast ([place])   03/16/2020 Breast MRI   IMPRESSION: 1. 8.1 x 7.8 x 6.6 cm biopsy proven invasive ductal carcinoma in the central right breast, involving 3 quadrants. 2. 3.0 x 1.7 x 1.1 cm satellite mass more inferiorly in lower inner quadrant of the left breast, compatible with additional malignancy. 3. Metastatic level 1 and level 2 left axillary lymph nodes. 4. No evidence of malignancy on the right.   03/17/2020 Imaging   IMPRESSION: CT CAP w contrast  1. Diffuse skin thickening in the left breast with left axillary and subpectoral lymphadenopathy, as well as a mildly enlarged left supraclavicular lymph node, which likely represents metastatic lymphadenopathy. No other definite extra nodal metastatic disease noted elsewhere in the chest, abdomen or pelvis. 2. Large mass in the central anatomic pelvis which is of uncertain origin, potentially a large exophytic fibroid or a large solid mass arising from the right ovary. Further evaluation with pelvic ultrasound is strongly recommended.   03/21/2020 Imaging   Bone Scan  IMPRESSION: Apparent arthropathy at L5. No bony metastatic disease is demonstrable on this study. Scattered foci of abnormal uptake in a pelvic mass is of uncertain etiology given absence of calcification in this mass by CT. This mass compresses the urinary bladder. It  is possible that some of the increased uptake in this area actually represents physiologic uptake within the bladder.   Kidneys noted in flank positions bilaterally.     03/22/2020 - 08/16/2020 Neo-Adjuvant Chemotherapy    Neoadjuvant Adriamycin and Cytoxan q2weeks for 4 cycles starting 03/22/20-05/03/20 followed by weekly Taxol and Carboplatin for 12 weeks starting 05/17/20-08/16/20   03/24/2020 Imaging   US Pelvis  IMPRESSION: 1. Large pedunculated lesion directly contiguous with the uterine most suggestive of a large subserosal fibroid which measures up to 8.2 cm. 2. Additional 1 cm probable intramural fibroid in the right anterior uterine body. 3. No other acute or worrisome pelvic abnormality.   03/29/2020 Genetic Testing   Negative genetic testing on the common hereditary cancer panel.  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  The Common Hereditary Gene Panel offered by [facility] includes sequencing and/or deletion duplication testing of the following 47 genes: APC, ATM, AXIN2, BARD1, BMPR1A, BRCA1, BRCA2, BRIP1, CDH1, CDK4, CDKN2A (p14ARF), CDKN2A (p16INK4a), CHEK2, CTNNA1, DICER1, EPCAM (Deletion/duplication testing only), GREM1 (promoter region deletion/duplication testing only), KIT, MEN1, MLH1, MSH2, MSH3, MSH6, MUTYH, NBN, NF1, NHTL1, PALB2, PDGFRA, PMS2, POLD1, POLE, PTEN, RAD50, RAD51C, RAD51D, SDHB, SDHC, SDHD, SMAD4, SMARCA4. STK11, TP53, TSC1, TSC2, and VHL.  The following genes were evaluated for sequence changes only: SDHA and HOXB13 c.251G>A variant only. The report date is 03/29/2020   04/28/2020 - 10/27/2020 Antibody Plan   Added Keytruda q3weeks starting 04/28/20 to complete 1 year of treatment. Held since 10/27/20 due to skin rash and body aches.    08/17/2020 Breast MRI   IMPRESSION: 1. The biopsy proven malignancy in the left breast and the adjacent satellite lesion have resolved in the interval. No abnormalities in these locations today. 2. No MRI evidence of malignancy in the right breast. 3. No adenopathy identified today   09/20/2020 Surgery   LEFT MODIFIED RADICAL MASTECTOMY by Dr Donne Hazel   09/20/2020 Pathology Results   FINAL MICROSCOPIC DIAGNOSIS:   A. BREAST, LEFT, MODIFIED RADICAL  MASTECTOMY:  - No residual invasive carcinoma in breast status post neoadjuvant  treatment.  - Metastatic carcinoma in (2) of (14) lymph nodes.  - Biopsy sites (one in breast, one in one of the positive lymph nodes).  - See oncology table.   B. ADDITIONAL AXILLARY CONTENTS, LEFT, DISSECTION:  - Metastatic carcinoma in (5) of (5) lymph nodes.    09/20/2020 Cancer Staging   Staging form: Breast, AJCC 8th Edition - Pathologic stage from 09/20/2020: No Stage Recommended (ypT0, pN2a, cM0, G3, ER-, PR-, HER2-) - Signed by Gardenia Phlegm, NP on 10/04/2020 Stage prefix: Post-therapy Histologic grading system: 3 grade system   10/30/2020 -  Radiation Therapy   Adjuvant Radiation with Dr Sondra Come starting 10/30/20   10/30/2020 -  Chemotherapy   Adjuvant Xeloda starting 10/30/20 at 1549m BID M-F while on RT.       CURRENT THERAPY:  -Added Keytruda q3weekson10/22/21 to complete 1 year of treatment.Held since 10/27/20 due to skin rash and body aches. -Adjuvant Radiationwith Dr KSondra Comestarting 10/30/20-12/11/20 -Adjuvant Xelodastarting 10/30/20 at15065mBID M-Fwhile on RT.Held since 11/20/20 given her recurrent skin rash.   INTERVAL HISTORY:  AnPETRONELLA SHUFORDs here for a follow up. She was last seen by me 11/09/20. She presents to the clinic alone. She notes her skin rash recurred further in her upper body even on steroids. It leads to burning, itching. She was seen by Dermatologist PA JeValeda Malmn [place] 5/13 for skin biopsy and results are  pending. She was also given topical cream and kenalog injection and another round of prednisone. She notes she is currently on $RemoveBefo'20mg'HeqRpdVxceZ$  Prednisone dose. She is able to sleep with Z-quil and Benadryl.  She notes her BP was elevated at 138/102 today. She denies headaches or chest discomfort. She notes her energy and appetite is adequate. I reviewed the medication list with her.    REVIEW OF SYSTEMS:   Constitutional: Denies fevers, chills or  abnormal weight loss Eyes: Denies blurriness of vision Ears, nose, mouth, throat, and face: Denies mucositis or sore throat Respiratory: Denies cough, dyspnea or wheezes Cardiovascular: Denies palpitation, chest discomfort or lower extremity swelling Gastrointestinal:  Denies nausea, heartburn or change in bowel habits Skin: (+) Diffuse upper body skin rash with itching and burning.  Lymphatics: Denies new lymphadenopathy or easy bruising Neurological:Denies numbness, tingling or new weaknesses Behavioral/Psych: Mood is stable, no new changes  All other systems were reviewed with the patient and are negative.  MEDICAL HISTORY:  Past Medical History:  Diagnosis Date  . Anemia   . Anxiety   . Arthritis   . Cancer (HCC)    Left Breast  . Depression   . GERD (gastroesophageal reflux disease)   . Headache   . Panic attack     SURGICAL HISTORY: Past Surgical History:  Procedure Laterality Date  . MASTECTOMY MODIFIED RADICAL Left 09/20/2020   Procedure: LEFT MODIFIED RADICAL MASTECTOMY;  Surgeon: [name], MD;  Location: [place];  Service: General;  Laterality: Left;  RNFA; PEC BLOCK;  . PORTACATH PLACEMENT Right 03/21/2020   Procedure: INSERTION PORT-A-CATH WITH ULTRASOUND GUIDANCE;  Surgeon: [name], MD;  Location[place];  Service: General;  Laterality: Right;    I have reviewed the social history and family history with the patient and they are unchanged from previous note.  ALLERGIES:  is allergic to medroxyprogesterone.  MEDICATIONS:  Current Outpatient Medications  Medication Sig Dispense Refill  . ALPRAZolam (XANAX) 1 MG tablet Take 1 mg by mouth 3 (three) times daily as needed for anxiety.     . baclofen (LIORESAL) 10 MG tablet Take 1 tablet (10 mg total) by mouth 2 (two) times daily as needed for muscle spasms. 30 each 0  . capecitabine (XELODA) 500 MG tablet Take 3 tablets (1,500 mg total) by mouth 2 (two) times daily after a meal.  Take only on days of radiation Monday through Friday. 90 tablet 1  . citalopram (CELEXA) 20 MG tablet Take 20 mg by mouth daily.    Marland Kitchen dexamethasone (DECADRON) 4 MG tablet Take 2 tablets by mouth daily starting the day after Carboplatin and Cytoxan x 3 days. Take with food. 30 tablet 1  . diphenhydrAMINE (BENADRYL) 25 mg capsule Take 25 mg by mouth every 6 (six) hours as needed for allergies.    . diphenhydrAMINE HCl (ZZZQUIL) 50 MG/30ML LIQD Take 30 mLs by mouth at bedtime as needed (sleep).    . fluticasone (FLONASE) 50 MCG/ACT nasal spray Place 2 sprays into both nostrils daily as needed for allergies or rhinitis.    . furosemide (LASIX) 20 MG tablet Take 1 tablet (20 mg total) by mouth daily as needed for edema. 10 tablet 0  . gabapentin (NEURONTIN) 100 MG capsule Take 1 capsule (100 mg total) by mouth 3 (three) times daily as needed (hot flashes). May increase to $RemoveBef'300mg'fBExEpIlDr$  three times daily as need in a few weeks if tolerates well 90 capsule 1  . ibuprofen (ADVIL) 200 MG tablet Take  800 mg by mouth every 8 (eight) hours as needed for moderate pain.    Marland Kitchen lidocaine-prilocaine (EMLA) cream Apply 1 application topically as needed (port access). Apply to affected area once 30 g 3  . magic mouthwash w/lidocaine SOLN Take 5 mLs by mouth 4 (four) times daily as needed for mouth pain. 480 mL 1  . methocarbamol (ROBAXIN) 500 MG tablet Take 1 tablet (500 mg total) by mouth every 6 (six) hours as needed for muscle spasms. 30 tablet 2  . methylPREDNISolone (MEDROL DOSEPAK) 4 MG TBPK tablet Take 1 tablet (4 mg total) by mouth taper from 4 doses each day to 1 dose and stop. 1 each 0  . metroNIDAZOLE (METROGEL) 0.75 % vaginal gel Place 1 Applicatorful vaginally 2 (two) times daily. 70 g 1  . ondansetron (ZOFRAN) 8 MG tablet Take 1 tablet (8 mg total) by mouth 2 (two) times daily as needed. Start on the third day after chemotherapy. 30 tablet 2  . predniSONE (DELTASONE) 10 MG tablet Take 1 tablet (10 mg total) by  mouth daily with breakfast. 60 tablet 0  . prochlorperazine (COMPAZINE) 10 MG tablet Take 1 tablet (10 mg total) by mouth every 6 (six) hours as needed (Nausea or vomiting). 30 tablet 2  . traMADol (ULTRAM) 50 MG tablet Take 1 tablet (50 mg total) by mouth daily as needed for severe pain (headache). 30 tablet 0  . triamcinolone ointment (KENALOG) 0.5 % Apply 1 application topically 2 (two) times daily. 30 g 2   No current facility-administered medications for this visit.   Facility-Administered Medications Ordered in Other Visits  Medication Dose Route Frequency Provider Last Rate Last Admin  . sodium chloride flush (NS) 0.9 % injection 10 mL  10 mL Intravenous PRN Alla Feeling, NP   10 mL at 03/28/20 1104    PHYSICAL EXAMINATION: ECOG PERFORMANCE STATUS: 2 - Symptomatic, <50% confined to bed  Vitals:   11/20/20 1147  BP: (!) 138/102  Pulse: 93  Resp: 19  Temp: (!) 97.3 F (36.3 C)  SpO2: 100%   Filed Weights   11/20/20 1147  Weight: 212 lb 4.8 oz (96.3 kg)    GENERAL:alert, no distress and comfortable SKIN: skin color, texture, turgor are normal (+) Diffuse skin rash of upper body with skin erythema  EYES: normal, Conjunctiva are pink and non-injected, sclera clear  NECK: supple, thyroid normal size, non-tender, without nodularity LYMPH:  no palpable lymphadenopathy in the cervical, axillary  LUNGS: clear to auscultation and percussion with normal breathing effort HEART: regular rate & rhythm and no murmurs and no lower extremity edema ABDOMEN:abdomen soft, non-tender and normal bowel sounds Musculoskeletal:no cyanosis of digits and no clubbing  NEURO: alert & oriented x 3 with fluent speech, no focal motor/sensory deficits  LABORATORY DATA:  I have reviewed the data as listed CBC Latest Ref Rng & Units 11/20/2020 11/13/2020 11/06/2020  WBC 4.0 - 10.5 K/uL 5.2 10.0 8.9  Hemoglobin 12.0 - 15.0 g/dL 12.1 11.9(L) 11.4(L)  Hematocrit 36.0 - 46.0 % 37.8 36.9 35.6(L)  Platelets  150 - 400 K/uL 225 275 318     CMP Latest Ref Rng & Units 11/20/2020 11/13/2020 11/06/2020  Glucose 70 - 99 mg/dL 105(H) 121(H) 105(H)  BUN 6 - 20 mg/dL _0 Creatinine 0.44 - 1.00 mg/dL 0.85 0.98 0.75  Sodium 135 - 145 mmol/L 138 138 138  Potassium 3.5 - 5.1 mmol/L 3.5 4.3 4.1  Chloride 98 - 111 mmol/L 104 100 104  CO2 22 - 32 mmol/L _0 Calcium 8.9 - 10.3 mg/dL 9.4 9.8 9.2  Total Protein 6.5 - 8.1 g/dL 7.1 7.6 7.1  Total Bilirubin 0.3 - 1.2 mg/dL 0.4 0.4 0.3  Alkaline Phos 38 - 126 U/L 158(H) 161(H) 153(H)  AST 15 - 41 U/L _1 ALT 0 - 44 U/L _2 RADIOGRAPHIC STUDIES: I have personally reviewed the radiological images as listed and agreed with the findings in the report. No results found.   ASSESSMENT & PLAN:  Caitlyn Hendricks is a 40 y.o. female with   1. Skin rashes on front upper chest and head, body aches -She has had intermittent rash since she started chemo for breast cancer, much worse lately  -I discussed this ispossiblean autoimmune response from [facility]. We have held [place] since 10/27/20. -I startedher on prednisone taper over 3-4weeks to manage her rash and joint acheson 10/27/20.  -After first dose prednisone, her skin rash has started to clear up andbodypain resolved. Unfortunately after steroid treatment, her skin rash recurred further in her upper body. This has lead to burning, itching. -She was seen by Dermatologist on 11/17/20 for skin biopsy and results are pending. She was also given topical cream, kenalog injection and another round of prednisone. She notes she is currently on 28m Prednisone dose. She was also offered OTC Pepcid and Claritin.  -since her rash got much worse since she started chemoRT, I recommend holding Xeloda for now as well given cause of rash is still unknown. She is fine to complete RT.  -I discussed she may also see Allergist for further workup if needed.    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 Her 03/08/20 biopsy showedshe has grade III invasive ductal carcinoma of left breast metastatic to her left axillary LN. Her ER/PR/HER2 markers were all negative.CT CAP/Bone scan negative for distant mets. -To downstage disease and reduce risk of recurrence, she completed neoadjuvant chemo with ddAC q2weeksfor 4 cycles9/15/21-10/27/21. Followed by weekly carbo/taxolfor 12 weeks 05/17/20-2/9/22before proceeding with surgery. -Based onrecently publish [employer] 522 trial data,I added Keytruda q3weeks for 1 year treatment on 04/28/20.Tolerating well. -She underwent left mastectomy with Dr WDonne Hazelon 09/20/20. Surgical path showsNo residual invasive carcinoma in breast status post neoadjuvant treatment,however has 7/19 positive LN. I discussed with this many positive LNs, she has veryhigh risk of cancer recurrence. -Istarted her on adjuvant chemotherapy with oral Xeloda for 6 months to reduce her risk of cancer recurrence. She will take with RT at lower dose 15068mBID M-F, otherwise will continue 2 weeks on/1 week off.StartedXeloda on 10/30/20.  -Shestartedadjuvant radiation on 10/30/20 with Dr KiSondra Comelan to complete 12/11/20. She is tolerating well.  -She unfortunately has had recurrent significant skin rash of upper body. I have [place] and will hold Xeloda starting 11/20/20. Her skin biopsy from 11/17/20 with Dermatologist is still pending.  -She will continue Radiation.  -F/u next week.   3. Low back pain -She has hadnew onset low back pain. Her MRI Lumbar spine from 10/26/20 showedProgressive anterolisthesis and facet degeneration at L5-S1. Mild subarticular and foraminal stenosis  bilaterally. -Will monitor.Will refer to orthopedics if gets worse  4. Genetic Testingnegative for pathogenetic mutations with VUS of POLE gene  5.  Anxiety/Depression, Social Support  -She is on Xanax 26m TID and Celexa49mand low dose Ambien for sleep. -She has very good social supportfrom mother and boyfriend.She owns a doEngineer, maintenanceusiness -Mood is stable.  6. Hot flashes -I discussed chemo will put her in perimenopause, so symptoms of hot flashes can happen. Last full period with start of chemo(03/2020) -She is on Celexa 4026murrently.She is also onGabapentin 100m40mt flashes improved off chemotherapy.  7.COVID (+) on 08/02/20 with symptoms of Hoarsenessand ribcage pain. She has recovered quickly and completely  8. Hypertension  -Her BP is elevated in the last 2 weeks, today at 138/102 (5/1/622). She is not symptomatic. Will recheck today.    PLAN: -Continue to hold Keytruda. Will hold Xeloda starting today for a week.  -Continue radiation -I refilled Gabapentin today  -Lab and Flush and F/u in 1 week.    No problem-specific Assessment & Plan notes found for this encounter.   No orders of the defined types were placed in this encounter.  All questions were answered. The patient knows to call the clinic with any problems, questions or concerns. No barriers to learning was detected. The total time spent in the appointment was 30 minutes.     Helaine Yackel Truitt Merle 11/20/2020   I, AmoyJoslyn Devon acting as scribe for Madelynn Malson Truitt Merle.   I have reviewed the above documentation for accuracy and completeness, and I agree with the above.

## 2020-11-16 ENCOUNTER — Ambulatory Visit: Payer: 59 | Admitting: Rehabilitation

## 2020-11-16 ENCOUNTER — Ambulatory Visit
Admission: RE | Admit: 2020-11-16 | Discharge: 2020-11-16 | Disposition: A | Discharge status: Home / Self Care | Payer: 59 | Source: Ambulatory Visit | Attending: Radiation Oncology | Admitting: Radiation Oncology

## 2020-11-16 ENCOUNTER — Other Ambulatory Visit: Payer: Self-pay

## 2020-11-16 DIAGNOSIS — C50112 Malignant neoplasm of central portion of left female breast: Secondary | ICD-10-CM | POA: Diagnosis not present

## 2020-11-17 ENCOUNTER — Ambulatory Visit
Admission: RE | Admit: 2020-11-17 | Discharge: 2020-11-17 | Disposition: A | Discharge status: Home / Self Care | Payer: 59 | Source: Ambulatory Visit | Attending: Radiation Oncology | Admitting: Radiation Oncology

## 2020-11-17 DIAGNOSIS — C50112 Malignant neoplasm of central portion of left female breast: Secondary | ICD-10-CM | POA: Diagnosis not present

## 2020-11-20 ENCOUNTER — Ambulatory Visit
Admission: RE | Admit: 2020-11-20 | Discharge: 2020-11-20 | Disposition: A | Discharge status: Home / Self Care | Payer: 59 | Source: Ambulatory Visit | Attending: Radiation Oncology | Admitting: Radiation Oncology

## 2020-11-20 ENCOUNTER — Inpatient Hospital Stay (HOSPITAL_BASED_OUTPATIENT_CLINIC_OR_DEPARTMENT_OTHER): Payer: 59 | Admitting: Hematology

## 2020-11-20 ENCOUNTER — Inpatient Hospital Stay: Payer: 59

## 2020-11-20 ENCOUNTER — Other Ambulatory Visit: Payer: Self-pay

## 2020-11-20 VITALS — BP 138/102 | HR 93 | Temp 97.3°F | Resp 19 | Wt 212.3 lb

## 2020-11-20 DIAGNOSIS — C50112 Malignant neoplasm of central portion of left female breast: Secondary | ICD-10-CM

## 2020-11-20 DIAGNOSIS — Z95828 Presence of other vascular implants and grafts: Secondary | ICD-10-CM

## 2020-11-20 LAB — CBC WITH DIFFERENTIAL (CANCER CENTER ONLY)
Abs Immature Granulocytes: 0.05 10*3/uL (ref 0.00–0.07)
Basophils Absolute: 0 10*3/uL (ref 0.0–0.1)
Basophils Relative: 0 %
Eosinophils Absolute: 0 10*3/uL (ref 0.0–0.5)
Eosinophils Relative: 0 %
HCT: 37.8 % (ref 36.0–46.0)
Hemoglobin: 12.1 g/dL (ref 12.0–15.0)
Immature Granulocytes: 1 %
Lymphocytes Relative: 17 %
Lymphs Abs: 0.9 10*3/uL (ref 0.7–4.0)
MCH: 29.8 pg (ref 26.0–34.0)
MCHC: 32 g/dL (ref 30.0–36.0)
MCV: 93.1 fL (ref 80.0–100.0)
Monocytes Absolute: 0.4 10*3/uL (ref 0.1–1.0)
Monocytes Relative: 8 %
Neutro Abs: 3.8 10*3/uL (ref 1.7–7.7)
Neutrophils Relative %: 74 %
Platelet Count: 225 10*3/uL (ref 150–400)
RBC: 4.06 MIL/uL (ref 3.87–5.11)
RDW: 16.9 % — ABNORMAL HIGH (ref 11.5–15.5)
WBC Count: 5.2 10*3/uL (ref 4.0–10.5)
nRBC: 0 % (ref 0.0–0.2)

## 2020-11-20 LAB — CMP (CANCER CENTER ONLY)
ALT: 26 U/L (ref 0–44)
AST: 18 U/L (ref 15–41)
Albumin: 3.8 g/dL (ref 3.5–5.0)
Alkaline Phosphatase: 158 U/L — ABNORMAL HIGH (ref 38–126)
Anion gap: 5 (ref 5–15)
BUN: 18 mg/dL (ref 6–20)
CO2: 29 mmol/L (ref 22–32)
Calcium: 9.4 mg/dL (ref 8.9–10.3)
Chloride: 104 mmol/L (ref 98–111)
Creatinine: 0.85 mg/dL (ref 0.44–1.00)
GFR, Estimated: >60 mL/min (ref >60)
Glucose, Bld: 105 mg/dL — ABNORMAL HIGH (ref 70–99)
Potassium: 3.5 mmol/L (ref 3.5–5.1)
Sodium: 138 mmol/L (ref 135–145)
Total Bilirubin: 0.4 mg/dL (ref 0.3–1.2)
Total Protein: 7.1 g/dL (ref 6.5–8.1)

## 2020-11-20 MED ORDER — SODIUM CHLORIDE 0.9% FLUSH
10.0000 mL | Freq: Once | INTRAVENOUS | Status: DC
  Administered 2020-11-20: 10 mL
  Filled 2020-11-20: qty 10

## 2020-11-20 MED ORDER — GABAPENTIN 100 MG PO CAPS: 100.0000 mg | ORAL_CAPSULE | Freq: Three times a day (TID) | ORAL | 1 refills | Status: DC | PRN

## 2020-11-20 MED ORDER — HEPARIN SOD (PORK) LOCK FLUSH 100 UNIT/ML IV SOLN
250.0000 [IU] | Freq: Once | INTRAVENOUS | Status: DC
  Administered 2020-11-20: 250 [IU]
  Filled 2020-11-20: qty 5

## 2020-11-21 ENCOUNTER — Telehealth: Payer: Self-pay

## 2020-11-21 ENCOUNTER — Encounter: Payer: Self-pay | Admitting: Hematology

## 2020-11-21 ENCOUNTER — Ambulatory Visit: Payer: 59 | Admitting: Radiation Oncology

## 2020-11-21 ENCOUNTER — Telehealth: Payer: Self-pay | Admitting: Hematology

## 2020-11-21 ENCOUNTER — Ambulatory Visit
Admission: RE | Admit: 2020-11-21 | Discharge: 2020-11-21 | Disposition: A | Discharge status: Home / Self Care | Payer: 59 | Source: Ambulatory Visit | Attending: Radiation Oncology | Admitting: Radiation Oncology

## 2020-11-21 DIAGNOSIS — C50112 Malignant neoplasm of central portion of left female breast: Secondary | ICD-10-CM

## 2020-11-21 MED ORDER — SONAFINE EX EMUL
1.0000 "application " | Freq: Once | CUTANEOUS | Status: AC
  Administered 2020-11-21: 1 via TOPICAL

## 2020-11-21 NOTE — Telephone Encounter (Signed)
I left vm at [facility] dermatology and skin surgery center requesting result of skin biopsy be faxed to [place].

## 2020-11-21 NOTE — Telephone Encounter (Signed)
Left message with follow-up appointment per 5/16 los. Gave option to call back to reschedule if needed. 

## 2020-11-22 ENCOUNTER — Ambulatory Visit
Admission: RE | Admit: 2020-11-22 | Discharge: 2020-11-22 | Disposition: A | Discharge status: Home / Self Care | Payer: 59 | Source: Ambulatory Visit | Attending: Radiation Oncology | Admitting: Radiation Oncology

## 2020-11-22 DIAGNOSIS — C50112 Malignant neoplasm of central portion of left female breast: Secondary | ICD-10-CM | POA: Diagnosis not present

## 2020-11-22 NOTE — Progress Notes (Signed)
[place]   [phone] [phone]   Clinic Follow up Note   Patient Care Team: [name], MD as PCP - General (Family Medicine) Mauro Kaufmann, RN as Oncology Nurse Navigator Rockwell Germany, RN as Oncology Nurse Navigator [name], MD as Consulting Physician (General Surgery) Truitt Merle, MD as Consulting Physician (Hematology) Gery Pray, MD as Consulting Physician (Radiation Oncology)  Date of Service:  11/27/2020  CHIEF COMPLAINT: f/u ofleft breast cancer, symptom management  SUMMARY OF ONCOLOGIC HISTORY: Oncology History Overview Note  Cancer Staging Cancer of central portion of left female breast [facility] Caitlyn) Staging form: Breast, AJCC 8th Edition - Clinical: No stage assigned - Unsigned    Cancer of central portion of left female breast ([place])  02/23/2020 Mammogram   Diagnostic Mammogram 02/23/20  IMPRESSION The 2x1x2.6cm irregular mass in teh left breast at 12:00 posiiton middle depth is highly suspicious of malignancy. Caitlyn [place] is recommended for further evaluation and biopsy planning purposes.   02/23/2020 Breast US   [name] 02/23/20  IMPRESSION 2 adjacent spiculated masses in the left brast at 12:00 position 3 cm from the nipple (2.1x0.9x1.1cm and 1.1x1.4x0.5cm) is suggestive of malignancy.   Multiple abnormal left subpectoral and left axillary nodes measuring 3.3x2.1 cm concerning for metastatic adenopathy.    Left breast skin thickening and edema may be secondary congestive edema due to extensive axillary adenopathy.    03/08/2020 Initial Biopsy   Diagnosis 1.Breast, left, needle core biopsy, 12:00 position, 3cmfn -INVASIVE DUCTAL CARCINOMA -SEE COMMENT  2. Lymph node, needle/core biopsy, left axilla -METASTATIC CARCINOMA INVOLVING A LYMPH NODE  -LYMPHOVASCULAR SPACE INVASION PRESENT   Microscopic Comment  1.Based on the biopsy the carcinoma appears Nottingham Grade 3 or 3 and measures 1 cm in the  greatest linear extent.    03/08/2020 Receptors her2   ER- Negative 0% PR - Negative 0% HER2 - Negative  KI 67 - 80%    03/08/2020 Cancer Staging   Staging form: Breast, AJCC 8th Edition - Clinical stage from 03/08/2020: Stage IIIB (cT2, cN1, cM0, G3, ER-, PR-, HER2-) - Signed by Truitt Merle, MD on 03/10/2020   03/10/2020 Initial Diagnosis   Cancer of central portion of left female breast ([place])   03/16/2020 Breast MRI   IMPRESSION: 1. 8.1 x 7.8 x 6.6 cm biopsy proven invasive ductal carcinoma in the central right breast, involving 3 quadrants. 2. 3.0 x 1.7 x 1.1 cm satellite mass more inferiorly in lower inner quadrant of the left breast, compatible with additional malignancy. 3. Metastatic level 1 and level 2 left axillary lymph nodes. 4. No evidence of malignancy on the right.   03/17/2020 Imaging   IMPRESSION: CT CAP w contrast  1. Diffuse skin thickening in the left breast with left axillary and subpectoral lymphadenopathy, as well as a mildly enlarged left supraclavicular lymph node, which likely represents metastatic lymphadenopathy. No other definite extra nodal metastatic disease noted elsewhere in the chest, abdomen or pelvis. 2. Large mass in the central anatomic pelvis which is of uncertain origin, potentially a large exophytic fibroid or a large solid mass arising from the right ovary. Further evaluation with pelvic ultrasound is strongly recommended.   03/21/2020 Imaging   Bone Scan  IMPRESSION: Apparent arthropathy at L5. No bony metastatic disease is demonstrable on this study. Scattered foci of abnormal uptake in a pelvic mass is of uncertain etiology given absence of calcification in this mass by CT. This mass compresses the urinary bladder. It is  possible that some of the increased uptake in this area actually represents physiologic uptake within the bladder.   Kidneys noted in flank positions bilaterally.     03/22/2020 - 08/16/2020 Neo-Adjuvant Chemotherapy    Neoadjuvant Adriamycin and Cytoxan q2weeks for 4 cycles starting 03/22/20-05/03/20 followed by weekly Taxol and Carboplatin for 12 weeks starting 05/17/20-08/16/20   03/24/2020 Imaging   US Pelvis  IMPRESSION: 1. Large pedunculated lesion directly contiguous with the uterine most suggestive of a large subserosal fibroid which measures up to 8.2 cm. 2. Additional 1 cm probable intramural fibroid in the right anterior uterine body. 3. No other acute or worrisome pelvic abnormality.   03/29/2020 Genetic Testing   Negative genetic testing on the common hereditary cancer panel.  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  The Common Hereditary Gene Panel offered by [facility] includes sequencing and/or deletion duplication testing of the following 47 genes: APC, ATM, AXIN2, BARD1, BMPR1A, BRCA1, BRCA2, BRIP1, CDH1, CDK4, CDKN2A (p14ARF), CDKN2A (p16INK4a), CHEK2, CTNNA1, DICER1, EPCAM (Deletion/duplication testing only), GREM1 (promoter region deletion/duplication testing only), KIT, MEN1, MLH1, MSH2, MSH3, MSH6, MUTYH, NBN, NF1, NHTL1, PALB2, PDGFRA, PMS2, POLD1, POLE, PTEN, RAD50, RAD51C, RAD51D, SDHB, SDHC, SDHD, SMAD4, SMARCA4. STK11, TP53, TSC1, TSC2, and VHL.  The following genes were evaluated for sequence changes only: SDHA and HOXB13 c.251G>A variant only. The report date is 03/29/2020   04/28/2020 - 10/27/2020 Antibody Plan   Added Keytruda q3weeks starting 04/28/20 to complete 1 year of treatment. Held since 10/27/20 due to skin rash and body aches.    08/17/2020 Breast MRI   IMPRESSION: 1. The biopsy proven malignancy in the left breast and the adjacent satellite lesion have resolved in the interval. No abnormalities in these locations today. 2. No MRI evidence of malignancy in the right breast. 3. No adenopathy identified today   09/20/2020 Surgery   LEFT MODIFIED RADICAL MASTECTOMY by Dr Donne Hazel   09/20/2020 Pathology Results   FINAL MICROSCOPIC DIAGNOSIS:   A. BREAST, LEFT, MODIFIED RADICAL  MASTECTOMY:  - No residual invasive carcinoma in breast status post neoadjuvant  treatment.  - Metastatic carcinoma in (2) of (14) lymph nodes.  - Biopsy sites (one in breast, one in one of the positive lymph nodes).  - See oncology table.   B. ADDITIONAL AXILLARY CONTENTS, LEFT, DISSECTION:  - Metastatic carcinoma in (5) of (5) lymph nodes.    09/20/2020 Cancer Staging   Staging form: Breast, AJCC 8th Edition - Pathologic stage from 09/20/2020: No Stage Recommended (ypT0, pN2a, cM0, G3, ER-, PR-, HER2-) - Signed by Gardenia Phlegm, NP on 10/04/2020 Stage prefix: Post-therapy Histologic grading system: 3 grade system   10/30/2020 -  Radiation Therapy   Adjuvant Radiation with Dr Sondra Come starting 10/30/20   10/30/2020 -  Chemotherapy   Adjuvant Xeloda starting 10/30/20 at 1599m BID M-F while on RT.       CURRENT THERAPY:  -Added Keytruda q3weekson10/22/21 to complete 1 year of treatment.Held since 10/27/20 due to skin rash and body aches. -Adjuvant Radiationwith Dr KSondra Comestarting 10/30/20-12/11/20 -Adjuvant Xelodastarting 10/30/20 at1506mBID M-Fwhile on RT.Held since 11/20/20 given her recurrent skin rash.   INTERVAL HISTORY:  AnRHAYNE CHATWINs here for a follow up. She was last seen by me 11/20/20. She presents to the clinic with her mother. She notes she still has significant skin rash which is more red. She was seen by Dermatologist. She was told her skin redness is from RT. She notes her rash scabs are crusted over. She also  feels having a reaction as she has hand, facial swelling and afternoon blurred vision. She was told her skin biopsy was from drug reaction. She has silvadene topical cream from Rad onc. She notes she continues to taper down Prednisone, now at 59m.  She is emotional about her reactions and how this can effect her cancer treatment. She would like to finish radiation.     REVIEW OF SYSTEMS:   Constitutional: Denies fevers, chills or abnormal weight  loss Eyes: Denies blurriness of vision Ears, nose, mouth, throat, and face: Denies mucositis or sore throat Respiratory: Denies cough, dyspnea or wheezes Cardiovascular: Denies palpitation, chest discomfort or lower extremity swelling Gastrointestinal:  Denies nausea, heartburn or change in bowel habits Skin: (+) Diffuse skin rash of upper body  Lymphatics: Denies new lymphadenopathy or easy bruising Neurological:Denies numbness, tingling or new weaknesses Behavioral/Psych: Mood is stable, no new changes  All other systems were reviewed with the patient and are negative.  MEDICAL HISTORY:  Past Medical History:  Diagnosis Date  . Anemia   . Anxiety   . Arthritis   . Cancer (HCC)    Left Breast  . Depression   . GERD (gastroesophageal reflux disease)   . Headache   . Panic attack     SURGICAL HISTORY: Past Surgical History:  Procedure Laterality Date  . MASTECTOMY MODIFIED RADICAL Left 09/20/2020   Procedure: LEFT MODIFIED RADICAL MASTECTOMY;  Surgeon: WRolm Bookbinder MD;  [facility]  Service: General;  Laterality: Left;  RNFA; PEC BLOCK;  . PORTACATH PLACEMENT Right 03/21/2020   Procedure: INSERTION PORT-A-CATH WITH ULTRASOUND GUIDANCE;  Surgeon: WRolm Bookbinder MD;  [facility]  Service: General;  Laterality: Right;    I have reviewed the social history and family history with the patient and they are unchanged from previous note.  ALLERGIES:  is allergic to medroxyprogesterone.  MEDICATIONS:  Current Outpatient Medications  Medication Sig Dispense Refill  . famotidine (PEPCID) 20 MG tablet Take 1 tablet (20 mg total) by mouth 2 (two) times daily. 60 tablet 1  . montelukast (SINGULAIR) 10 MG tablet Take 1 tablet (10 mg total) by mouth at bedtime. 30 tablet 0  . ALPRAZolam (XANAX) 1 MG tablet Take 1 mg by mouth 3 (three) times daily as needed for anxiety.     . baclofen (LIORESAL) 10 MG tablet Take 1 tablet (10 mg total) by mouth 2  (two) times daily as needed for muscle spasms. 30 each 0  . capecitabine (XELODA) 500 MG tablet Take 3 tablets (1,500 mg total) by mouth 2 (two) times daily after a meal. Take only on days of radiation Monday through Friday. 90 tablet 1  . citalopram (CELEXA) 20 MG tablet Take 20 mg by mouth daily.    .Marland Kitchendexamethasone (DECADRON) 4 MG tablet Take 2 tablets by mouth daily starting the day after Carboplatin and Cytoxan x 3 days. Take with food. 30 tablet 1  . diphenhydrAMINE (BENADRYL) 25 mg capsule Take 25 mg by mouth every 6 (six) hours as needed for allergies.    . diphenhydrAMINE HCl (ZZZQUIL) 50 MG/30ML LIQD Take 30 mLs by mouth at bedtime as needed (sleep).    . fluticasone (FLONASE) 50 MCG/ACT nasal spray Place 2 sprays into both nostrils daily as needed for allergies or rhinitis.    . furosemide (LASIX) 20 MG tablet Take 1 tablet (20 mg total) by mouth daily as needed for edema. 10 tablet 0  . gabapentin (NEURONTIN) 100 MG capsule Take 1 capsule (  100 mg total) by mouth 3 (three) times daily as needed (hot flashes). May increase to 334m three times daily as need in a few weeks if tolerates well 90 capsule 1  . ibuprofen (ADVIL) 200 MG tablet Take 800 mg by mouth every 8 (eight) hours as needed for moderate pain.    .Marland Kitchenlidocaine-prilocaine (EMLA) cream Apply 1 application topically as needed (port access). Apply to affected area once 30 g 3  . magic mouthwash w/lidocaine SOLN Take 5 mLs by mouth 4 (four) times daily as needed for mouth pain. 480 mL 1  . methocarbamol (ROBAXIN) 500 MG tablet Take 1 tablet (500 mg total) by mouth every 6 (six) hours as needed for muscle spasms. 30 tablet 2  . methylPREDNISolone (MEDROL DOSEPAK) 4 MG TBPK tablet Take 1 tablet (4 mg total) by mouth taper from 4 doses each day to 1 dose and stop. 1 each 0  . metroNIDAZOLE (METROGEL) 0.75 % vaginal gel Place 1 Applicatorful vaginally 2 (two) times daily. 70 g 1  . ondansetron (ZOFRAN) 8 MG tablet Take 1 tablet (8 mg  total) by mouth 2 (two) times daily as needed. Start on the third day after chemotherapy. 30 tablet 2  . predniSONE (DELTASONE) 10 MG tablet Take 1 tablet (10 mg total) by mouth daily with breakfast. 60 tablet 0  . prochlorperazine (COMPAZINE) 10 MG tablet Take 1 tablet (10 mg total) by mouth every 6 (six) hours as needed (Nausea or vomiting). 30 tablet 2  . traMADol (ULTRAM) 50 MG tablet Take 1 tablet (50 mg total) by mouth daily as needed for severe pain (headache). 30 tablet 0  . triamcinolone ointment (KENALOG) 0.5 % Apply 1 application topically 2 (two) times daily. 30 g 2   No current facility-administered medications for this visit.   Facility-Administered Medications Ordered in Other Visits  Medication Dose Route Frequency Provider Last Rate Last Admin  . sodium chloride flush (NS) 0.9 % injection 10 mL  10 mL Intravenous PRN BAlla Feeling NP   10 mL at 03/28/20 1104    PHYSICAL EXAMINATION: ECOG PERFORMANCE STATUS: 1 - Symptomatic but completely ambulatory  Vitals:   11/27/20 1255  BP: 123/90  Pulse: 71  Resp: 17  Temp: 97.7 F (36.5 C)  SpO2: 100%   Filed Weights   11/27/20 1255  Weight: 209 lb 3.2 oz (94.9 kg)    GENERAL:alert, no distress and comfortable SKIN: skin color, texture, turgor are normal (+) Diffuse upper body dried skin rash, skin erythema of left chest, shoulder, upper back  EYES: normal, Conjunctiva are pink and non-injected, sclera clear  NECK: supple, thyroid normal size, non-tender, without nodularity LYMPH:  no palpable lymphadenopathy in the cervical, axillary  LUNGS: clear to auscultation and percussion with normal breathing effort HEART: regular rate & rhythm and no murmurs and no lower extremity edema ABDOMEN:abdomen soft, non-tender and normal bowel sounds Musculoskeletal:no cyanosis of digits and no clubbing  NEURO: alert & oriented x 3 with fluent speech, no focal motor/sensory deficits  LABORATORY DATA:  I have reviewed the data as  listed CBC Latest Ref Rng & Units 11/27/2020 11/20/2020 11/13/2020  WBC 4.0 - 10.5 K/uL 6.7 5.2 10.0  Hemoglobin 12.0 - 15.0 g/dL 12.6 12.1 11.9(L)  Hematocrit 36.0 - 46.0 % 38.1 37.8 36.9  Platelets 150 - 400 K/uL 206 225 275     CMP Latest Ref Rng & Units 11/27/2020 11/20/2020 11/13/2020  Glucose 70 - 99 mg/dL 73 105(H) 121(H)  BUN 6 -  20 mg/dL _0 Creatinine 0.44 - 1.00 mg/dL 0.89 0.85 0.98  Sodium 135 - 145 mmol/L 138 138 138  Potassium 3.5 - 5.1 mmol/L 3.8 3.5 4.3  Chloride 98 - 111 mmol/L 104 104 100  CO2 22 - 32 mmol/L _1 Calcium 8.9 - 10.3 mg/dL 9.9 9.4 9.8  Total Protein 6.5 - 8.1 g/dL 7.5 7.1 7.6  Total Bilirubin 0.3 - 1.2 mg/dL 0.7 0.4 0.4  Alkaline Phos 38 - 126 U/L 172(H) 158(H) 161(H)  AST 15 - 41 U/L _2 ALT 0 - 44 U/L _3 RADIOGRAPHIC STUDIES: I have personally reviewed the radiological images as listed and agreed with the findings in the report. No results found.   ASSESSMENT & PLAN:  Caitlyn Hendricks is a 40 y.o. female with   1. 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 We have held [place] since 10/27/20. -I startedher on prednisone taper over 3-4weeks to manage her rash and joint acheson 10/27/20.  -After first dose prednisone, her skin rash has started to clear up andbodypainresolved. Unfortunately after steroid treatment, her skin rash recurred further in her upper body. This has lead to burning, itching. -She was seen by Dermatologist on 11/17/20 for skin biopsy and results showed drug reaction. She was also given topical cream, kenalog injection and another round of prednisone. She notes she is on tapering dose Prednisone, currently 69m (11/27/20). She was also offered OTC Pepcid and Claritin.  -Since her rash got much worse since she  started chemoRT, Xeloda was held for now as well given cause of rash is still unknown. She is fine to complete RT until 12/11/20.  -Patient notes she still has facial/hand swelling and blurred vision which she attributes to some form of allergy reaction. I discussed she may also see Allergist for further workup.  -Will give IV benadryl and IV Fluids today (11/27/20). She is agreeable.  -She is on tapering dose prednisone, continue Benadryl every 4-6 hours at home, I also called in Pepcid and Singulair today -I spoke with Allergy specialist Dr. VHarold Hedgetoday, drug allergy test is difficult, [place]  He recommends the above allergy treatment.   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 Her 03/08/20 biopsy showedshe has grade III invasive ductal carcinoma of left breast metastatic to her left axillary LN. Her ER/PR/HER2 markers were all negative.CT CAP/Bone scan negative for distant mets. -To downstage disease and reduce risk of recurrence, she completed neoadjuvant chemo with ddAC q2weeksfor 4 cycles9/15/21-10/27/21. Followed by weekly carbo/taxolfor 12 weeks 05/17/20-2/9/22before proceeding with surgery. -Based onrecently publish [employer] 522 trial data,I added Keytruda q3weeks for 1 year treatment on 04/28/20.Tolerating well. -She underwent left mastectomy with Dr WDonne Hazelon 09/20/20. Surgical path showsNo residual invasive carcinoma in breast status post neoadjuvant treatment,however has 7/19 positive LN. I discussed with this many positive LNs, she has veryhigh risk of cancer recurrence. -Istarted her on adjuvant chemotherapy with oral Xeloda for 6 months to reduce her risk  of cancer recurrence. She will take with RT at lower dose 15021m  BID M-F, otherwise will continue 2 weeks on/1 week off.StartedXeloda on 10/30/20.  -Shestartedadjuvant radiation on 10/30/20 with Dr Sondra Come.Plan to complete 12/11/20. She is tolerating well.  -She unfortunately has had recurrent significant skin rash of upper body. I have held [place] from 10/27/20 and held Xeloda starting 11/20/20. Her skin biopsy from 11/17/20 with Dermatologist shows drug reaction. I discussed option of seeing allergist for further work up.  -She is concerned by her drug reactions effecting her cancer treatment. I discussed possibility of adding back Keytruda first after her radiation and adding Xeloda back later if she does not have recurrent reactions after she completes RT.   -She will continue Radiation until 12/11/20.  -F/u next week.   3. Low back pain -She has hadnew onset low back pain. Her MRI Lumbar spine from 10/26/20 showedProgressive anterolisthesis and facet degeneration at L5-S1. Mild subarticular and foraminal stenosis bilaterally. -Will monitor.Will refer to orthopedics if gets worse  4. Genetic Testingnegative for pathogenetic mutations with VUS of POLE gene  5. Anxiety/Depression, Social Support  -She is on Xanax 2m TID and Celexa429mand low dose Ambien for sleep. -She has very good social supportfrom mother and boyfriend.She ownsa doggroomingbusiness -Mood is stable.  6. Hot flashes -I discussed chemo will put her in perimenopause, so symptoms of hot flashes can happen. Last full period with start of chemo(03/2020) -She is on Celexa 406murrently.She is also onGabapentin 100m64mt flashes improved off chemotherapy.  7.COVID (+) on 08/02/20 with symptoms of Hoarsenessand ribcage pain. She has recovered quickly and completely   PLAN: -Continue to hold Keytruda and Xeloda  -IV Fluids and IV Benadryl today  -Continue radiation until 12/11/20.  -Lab and Flush, IV  Fluids and iv benadryl 1-2 times weekly for a few weeks    No problem-specific Assessment & Plan notes found for this encounter.   No orders of the defined types were placed in this encounter.  All questions were answered. The patient knows to call the clinic with any problems, questions or concerns. No barriers to learning was detected. The total time spent in the appointment was 30 minutes.     Kamela Blansett Truitt Merle 11/27/2020   I, AmoyJoslyn Devon acting as scribe for Galdino Hinchman Truitt Merle.   I have reviewed the above documentation for accuracy and completeness, and I agree with the above.

## 2020-11-23 ENCOUNTER — Ambulatory Visit: Payer: 59

## 2020-11-23 ENCOUNTER — Ambulatory Visit
Admission: RE | Admit: 2020-11-23 | Discharge: 2020-11-23 | Disposition: A | Discharge status: Home / Self Care | Payer: 59 | Source: Ambulatory Visit | Attending: Radiation Oncology | Admitting: Radiation Oncology

## 2020-11-23 DIAGNOSIS — C50112 Malignant neoplasm of central portion of left female breast: Secondary | ICD-10-CM | POA: Diagnosis not present

## 2020-11-24 ENCOUNTER — Ambulatory Visit
Admission: RE | Admit: 2020-11-24 | Discharge: 2020-11-24 | Disposition: A | Discharge status: Home / Self Care | Payer: 59 | Source: Ambulatory Visit | Attending: Radiation Oncology | Admitting: Radiation Oncology

## 2020-11-24 ENCOUNTER — Other Ambulatory Visit: Payer: Self-pay

## 2020-11-24 DIAGNOSIS — C50112 Malignant neoplasm of central portion of left female breast: Secondary | ICD-10-CM | POA: Diagnosis not present

## 2020-11-25 ENCOUNTER — Ambulatory Visit: Payer: 59

## 2020-11-27 ENCOUNTER — Inpatient Hospital Stay (HOSPITAL_BASED_OUTPATIENT_CLINIC_OR_DEPARTMENT_OTHER): Payer: 59 | Admitting: Hematology

## 2020-11-27 ENCOUNTER — Ambulatory Visit
Admission: RE | Admit: 2020-11-27 | Discharge: 2020-11-27 | Disposition: A | Discharge status: Home / Self Care | Payer: 59 | Source: Ambulatory Visit | Attending: Radiation Oncology | Admitting: Radiation Oncology

## 2020-11-27 ENCOUNTER — Inpatient Hospital Stay: Payer: 59

## 2020-11-27 ENCOUNTER — Other Ambulatory Visit: Payer: Self-pay

## 2020-11-27 ENCOUNTER — Encounter: Payer: Self-pay | Admitting: Hematology

## 2020-11-27 ENCOUNTER — Ambulatory Visit: Payer: 59

## 2020-11-27 VITALS — BP 123/90 | HR 71 | Temp 97.7°F | Resp 17 | Ht 64.0 in | Wt 209.2 lb

## 2020-11-27 DIAGNOSIS — L298 Other pruritus: Secondary | ICD-10-CM | POA: Diagnosis not present

## 2020-11-27 DIAGNOSIS — Z171 Estrogen receptor negative status [ER-]: Secondary | ICD-10-CM

## 2020-11-27 DIAGNOSIS — C50112 Malignant neoplasm of central portion of left female breast: Secondary | ICD-10-CM

## 2020-11-27 DIAGNOSIS — Z95828 Presence of other vascular implants and grafts: Secondary | ICD-10-CM

## 2020-11-27 LAB — CMP (CANCER CENTER ONLY)
ALT: 29 U/L (ref 0–44)
AST: 18 U/L (ref 15–41)
Albumin: 3.9 g/dL (ref 3.5–5.0)
Alkaline Phosphatase: 172 U/L — ABNORMAL HIGH (ref 38–126)
Anion gap: 9 (ref 5–15)
BUN: 19 mg/dL (ref 6–20)
CO2: 25 mmol/L (ref 22–32)
Calcium: 9.9 mg/dL (ref 8.9–10.3)
Chloride: 104 mmol/L (ref 98–111)
Creatinine: 0.89 mg/dL (ref 0.44–1.00)
GFR, Estimated: >60 mL/min (ref >60)
Glucose, Bld: 73 mg/dL (ref 70–99)
Potassium: 3.8 mmol/L (ref 3.5–5.1)
Sodium: 138 mmol/L (ref 135–145)
Total Bilirubin: 0.7 mg/dL (ref 0.3–1.2)
Total Protein: 7.5 g/dL (ref 6.5–8.1)

## 2020-11-27 LAB — CBC WITH DIFFERENTIAL (CANCER CENTER ONLY)
Abs Immature Granulocytes: 0.03 10*3/uL (ref 0.00–0.07)
Basophils Absolute: 0 10*3/uL (ref 0.0–0.1)
Basophils Relative: 0 %
Eosinophils Absolute: 0 10*3/uL (ref 0.0–0.5)
Eosinophils Relative: 0 %
HCT: 38.1 % (ref 36.0–46.0)
Hemoglobin: 12.6 g/dL (ref 12.0–15.0)
Immature Granulocytes: 1 %
Lymphocytes Relative: 10 %
Lymphs Abs: 0.7 10*3/uL (ref 0.7–4.0)
MCH: 30.3 pg (ref 26.0–34.0)
MCHC: 33.1 g/dL (ref 30.0–36.0)
MCV: 91.6 fL (ref 80.0–100.0)
Monocytes Absolute: 0.6 10*3/uL (ref 0.1–1.0)
Monocytes Relative: 9 %
Neutro Abs: 5.3 10*3/uL (ref 1.7–7.7)
Neutrophils Relative %: 80 %
Platelet Count: 206 10*3/uL (ref 150–400)
RBC: 4.16 MIL/uL (ref 3.87–5.11)
RDW: 18.7 % — ABNORMAL HIGH (ref 11.5–15.5)
WBC Count: 6.7 10*3/uL (ref 4.0–10.5)
nRBC: 0 % (ref 0.0–0.2)

## 2020-11-27 MED ORDER — SONAFINE EX EMUL
1.0000 | Freq: Once | CUTANEOUS | Status: AC
Start: 2020-11-27 — End: 2020-11-27
  Administered 2020-11-27: 1 via TOPICAL

## 2020-11-27 MED ORDER — SILVER SULFADIAZINE 1 % EX CREA
TOPICAL_CREAM | Freq: Once | CUTANEOUS | Status: AC
Start: 2020-11-27 — End: 2020-11-27

## 2020-11-27 MED ORDER — SODIUM CHLORIDE 0.9% FLUSH
10.0000 mL | Freq: Once | INTRAVENOUS | Status: AC
  Administered 2020-11-27: 10 mL
  Filled 2020-11-27: qty 10

## 2020-11-27 MED ORDER — DIPHENHYDRAMINE HCL 50 MG/ML IJ SOLN
50.0000 mg | Freq: Once | INTRAMUSCULAR | Status: AC
  Administered 2020-11-27: 50 mg via INTRAVENOUS

## 2020-11-27 MED ORDER — DIPHENHYDRAMINE HCL 50 MG/ML IJ SOLN
INTRAMUSCULAR | Status: AC
  Filled 2020-11-27: qty 1

## 2020-11-27 MED ORDER — ALRA NON-METALLIC DEODORANT (RAD-ONC)
1.0000 | Freq: Once | TOPICAL | Status: AC
Start: 2020-11-27 — End: 2020-11-27
  Administered 2020-11-27: 1 via TOPICAL

## 2020-11-27 MED ORDER — SODIUM CHLORIDE 0.9 % IV SOLN
Freq: Once | INTRAVENOUS | Status: AC
  Filled 2020-11-27: qty 250

## 2020-11-27 MED ORDER — HEPARIN SOD (PORK) LOCK FLUSH 100 UNIT/ML IV SOLN
500.0000 [IU] | Freq: Once | INTRAVENOUS | Status: AC
  Administered 2020-11-27: 500 [IU]
  Filled 2020-11-27: qty 5

## 2020-11-27 MED ORDER — FAMOTIDINE 20 MG PO TABS: 20.0000 mg | ORAL_TABLET | Freq: Two times a day (BID) | ORAL | 1 refills | Status: DC

## 2020-11-27 MED ORDER — HEPARIN SOD (PORK) LOCK FLUSH 100 UNIT/ML IV SOLN
500.0000 [IU] | Freq: Once | INTRAVENOUS | Status: DC
  Filled 2020-11-27: qty 5

## 2020-11-27 MED ORDER — MONTELUKAST SODIUM 10 MG PO TABS: 10.0000 mg | ORAL_TABLET | Freq: Every day | ORAL | 0 refills | Status: DC

## 2020-11-27 NOTE — Patient Instructions (Signed)
Rehydration, Adult Rehydration is the replacement of body fluids, salts, and minerals (electrolytes) that are lost during dehydration. Dehydration is when there is not enough water or other fluids in the body. This happens when you lose more fluids than you take in. Common causes of dehydration include:  Not drinking enough fluids. This can occur when you are ill or doing activities that require a lot of energy, especially in hot weather.  Conditions that cause loss of water or other fluids, such as diarrhea, vomiting, sweating, or urinating a lot.  Other illnesses, such as fever or infection.  Certain medicines, such as those that remove excess fluid from the body (diuretics). Symptoms of mild or moderate dehydration may include thirst, dry lips and mouth, and dizziness. Symptoms of severe dehydration may include increased heart rate, confusion, fainting, and not urinating. For severe dehydration, you may need to get fluids through an IV at the hospital. For mild or moderate dehydration, you can usually rehydrate at home by drinking certain fluids as told by your health care provider. What are the risks? Generally, rehydration is safe. However, taking in too much fluid (overhydration) can be a problem. This is rare. Overhydration can cause an electrolyte imbalance, kidney failure, or a decrease in salt (sodium) levels in the body. Supplies needed You will need an oral rehydration solution (ORS) if your health care provider tells you to use one. This is a drink to treat dehydration. It can be found in pharmacies and retail stores. How to rehydrate Fluids Follow instructions from your health care provider for rehydration. The kind of fluid and the amount you should drink depend on your condition. In general, you should choose drinks that you prefer.  If told by your health care provider, drink an ORS. ? Make an ORS by following instructions on the package. ? Start by drinking small amounts,  about  cup (120 mL) every 5-10 minutes. ? Slowly increase how much you drink until you have taken the amount recommended by your health care provider.  Drink enough clear fluids to keep your urine pale yellow. If you were told to drink an ORS, finish it first, then start slowly drinking other clear fluids. Drink fluids such as: ? Water. This includes sparkling water and flavored water. Drinking only water can lead to having too little sodium in your body (hyponatremia). Follow the advice of your health care provider. ? Water from ice chips you suck on. ? Fruit juice with water you add to it (diluted). ? Sports drinks. ? Hot or cold herbal teas. ? Broth-based soups. ? Milk or milk products. Food Follow instructions from your health care provider about what to eat while you rehydrate. Your health care provider may recommend that you slowly begin eating regular foods in small amounts.  Eat foods that contain a healthy balance of electrolytes, such as bananas, oranges, potatoes, tomatoes, and spinach.  Avoid foods that are greasy or contain a lot of sugar. In some cases, you may get nutrition through a feeding tube that is passed through your nose and into your stomach (nasogastric tube, or NG tube). This may be done if you have uncontrolled vomiting or diarrhea.   Beverages to avoid Certain beverages may make dehydration worse. While you rehydrate, avoid drinking alcohol.   How to tell if you are recovering from dehydration You may be recovering from dehydration if:  You are urinating more often than before you started rehydrating.  Your urine is pale yellow.  Your energy level   improves.  You vomit less frequently.  You have diarrhea less frequently.  Your appetite improves or returns to normal.  You feel less dizzy or less light-headed.  Your skin tone and color start to look more normal. Follow these instructions at home:  Take over-the-counter and prescription medicines only  as told by your health care provider.  Do not take sodium tablets. Doing this can lead to having too much sodium in your body (hypernatremia). Contact a health care provider if:  You continue to have symptoms of mild or moderate dehydration, such as: ? Thirst. ? Dry lips. ? Slightly dry mouth. ? Dizziness. ? Dark urine or less urine than normal. ? Muscle cramps.  You continue to vomit or have diarrhea. Get help right away if you:  Have symptoms of dehydration that get worse.  Have a fever.  Have a severe headache.  Have been vomiting and the following happens: ? Your vomiting gets worse or does not go away. ? Your vomit includes blood or green matter (bile). ? You cannot eat or drink without vomiting.  Have problems with urination or bowel movements, such as: ? Diarrhea that gets worse or does not go away. ? Blood in your stool (feces). This may cause stool to look black and tarry. ? Not urinating, or urinating only a small amount of very dark urine, within 6-8 hours.  Have trouble breathing.  Have symptoms that get worse with treatment. These symptoms may represent a serious problem that is an emergency. Do not wait to see if the symptoms will go away. Get medical help right away. Call your local emergency services (911 in the [place] Do not drive yourself to the hospital. Summary  Rehydration is the replacement of body fluids and minerals (electrolytes) that are lost during dehydration.  Follow instructions from your health care provider for rehydration. The kind of fluid and amount you should drink depend on your condition.  Slowly increase how much you drink until you have taken the amount recommended by your health care provider.  Contact your health care provider if you continue to show signs of mild or moderate dehydration. This information is not intended to replace advice given to you by your health care provider. Make sure you discuss any questions you have with  your health care provider. Document Revised: 08/25/2019 Document Reviewed: 07/05/2019 Elsevier Patient Education  2021 Elsevier Inc.  

## 2020-11-28 ENCOUNTER — Ambulatory Visit: Payer: 59 | Admitting: Radiation Oncology

## 2020-11-28 ENCOUNTER — Ambulatory Visit
Admission: RE | Admit: 2020-11-28 | Discharge: 2020-11-28 | Disposition: A | Discharge status: Home / Self Care | Payer: 59 | Source: Ambulatory Visit | Attending: Radiation Oncology | Admitting: Radiation Oncology

## 2020-11-28 DIAGNOSIS — C50112 Malignant neoplasm of central portion of left female breast: Secondary | ICD-10-CM | POA: Diagnosis not present

## 2020-11-29 ENCOUNTER — Ambulatory Visit
Admission: RE | Admit: 2020-11-29 | Discharge: 2020-11-29 | Disposition: A | Discharge status: Home / Self Care | Payer: 59 | Source: Ambulatory Visit | Attending: Radiation Oncology | Admitting: Radiation Oncology

## 2020-11-29 ENCOUNTER — Other Ambulatory Visit: Payer: Self-pay

## 2020-11-29 DIAGNOSIS — C50112 Malignant neoplasm of central portion of left female breast: Secondary | ICD-10-CM | POA: Diagnosis not present

## 2020-11-30 ENCOUNTER — Ambulatory Visit: Payer: 59

## 2020-11-30 ENCOUNTER — Ambulatory Visit
Admission: RE | Admit: 2020-11-30 | Discharge: 2020-11-30 | Disposition: A | Discharge status: Home / Self Care | Payer: 59 | Source: Ambulatory Visit | Attending: Radiation Oncology | Admitting: Radiation Oncology

## 2020-11-30 DIAGNOSIS — C50112 Malignant neoplasm of central portion of left female breast: Secondary | ICD-10-CM | POA: Diagnosis not present

## 2020-11-30 MED ORDER — SILVER SULFADIAZINE 1 % EX CREA: TOPICAL_CREAM | Freq: Once | CUTANEOUS | Status: AC

## 2020-12-01 ENCOUNTER — Inpatient Hospital Stay: Payer: 59

## 2020-12-01 ENCOUNTER — Ambulatory Visit
Admission: RE | Admit: 2020-12-01 | Discharge: 2020-12-01 | Disposition: A | Discharge status: Home / Self Care | Payer: 59 | Source: Ambulatory Visit | Attending: Radiation Oncology | Admitting: Radiation Oncology

## 2020-12-01 ENCOUNTER — Other Ambulatory Visit: Payer: Self-pay

## 2020-12-01 ENCOUNTER — Ambulatory Visit: Payer: 59

## 2020-12-01 ENCOUNTER — Encounter: Payer: Self-pay | Admitting: Hematology

## 2020-12-01 DIAGNOSIS — C50112 Malignant neoplasm of central portion of left female breast: Secondary | ICD-10-CM | POA: Diagnosis not present

## 2020-12-01 DIAGNOSIS — L298 Other pruritus: Secondary | ICD-10-CM

## 2020-12-02 ENCOUNTER — Ambulatory Visit: Payer: 59

## 2020-12-05 ENCOUNTER — Ambulatory Visit: Payer: 59

## 2020-12-05 ENCOUNTER — Ambulatory Visit
Admission: RE | Admit: 2020-12-05 | Discharge: 2020-12-05 | Disposition: A | Discharge status: Home / Self Care | Payer: 59 | Source: Ambulatory Visit | Attending: Radiation Oncology | Admitting: Radiation Oncology

## 2020-12-05 DIAGNOSIS — C50112 Malignant neoplasm of central portion of left female breast: Secondary | ICD-10-CM | POA: Diagnosis not present

## 2020-12-06 ENCOUNTER — Other Ambulatory Visit: Payer: Self-pay

## 2020-12-06 ENCOUNTER — Ambulatory Visit: Payer: 59

## 2020-12-06 ENCOUNTER — Other Ambulatory Visit: Payer: Self-pay | Admitting: Radiation Oncology

## 2020-12-06 ENCOUNTER — Ambulatory Visit
Admission: RE | Admit: 2020-12-06 | Discharge: 2020-12-06 | Disposition: A | Discharge status: Home / Self Care | Payer: 59 | Source: Ambulatory Visit | Attending: Radiation Oncology | Admitting: Radiation Oncology

## 2020-12-06 DIAGNOSIS — C50112 Malignant neoplasm of central portion of left female breast: Secondary | ICD-10-CM

## 2020-12-06 DIAGNOSIS — Z171 Estrogen receptor negative status [ER-]: Secondary | ICD-10-CM | POA: Insufficient documentation

## 2020-12-06 DIAGNOSIS — Z8616 Personal history of COVID-19: Secondary | ICD-10-CM | POA: Diagnosis not present

## 2020-12-06 DIAGNOSIS — R21 Rash and other nonspecific skin eruption: Secondary | ICD-10-CM | POA: Diagnosis not present

## 2020-12-06 DIAGNOSIS — R232 Flushing: Secondary | ICD-10-CM | POA: Diagnosis not present

## 2020-12-06 DIAGNOSIS — L598 Other specified disorders of the skin and subcutaneous tissue related to radiation: Secondary | ICD-10-CM | POA: Diagnosis not present

## 2020-12-06 DIAGNOSIS — Z170 Estrogen receptor positive status [ER+]: Secondary | ICD-10-CM | POA: Diagnosis not present

## 2020-12-06 DIAGNOSIS — Z510 Encounter for antineoplastic radiation therapy: Secondary | ICD-10-CM | POA: Insufficient documentation

## 2020-12-06 DIAGNOSIS — Z79899 Other long term (current) drug therapy: Secondary | ICD-10-CM | POA: Diagnosis not present

## 2020-12-06 DIAGNOSIS — Z803 Family history of malignant neoplasm of breast: Secondary | ICD-10-CM | POA: Diagnosis not present

## 2020-12-06 DIAGNOSIS — M545 Low back pain, unspecified: Secondary | ICD-10-CM | POA: Diagnosis not present

## 2020-12-06 DIAGNOSIS — K219 Gastro-esophageal reflux disease without esophagitis: Secondary | ICD-10-CM | POA: Diagnosis not present

## 2020-12-06 DIAGNOSIS — F418 Other specified anxiety disorders: Secondary | ICD-10-CM | POA: Diagnosis not present

## 2020-12-06 MED ORDER — HYDROCODONE-ACETAMINOPHEN 5-325 MG PO TABS: 1.0000 | ORAL_TABLET | ORAL | 0 refills | Status: DC | PRN

## 2020-12-07 ENCOUNTER — Ambulatory Visit: Payer: 59

## 2020-12-07 ENCOUNTER — Inpatient Hospital Stay: Payer: 59

## 2020-12-07 ENCOUNTER — Ambulatory Visit
Admission: RE | Admit: 2020-12-07 | Discharge: 2020-12-07 | Disposition: A | Discharge status: Home / Self Care | Payer: 59 | Source: Ambulatory Visit | Attending: Radiation Oncology | Admitting: Radiation Oncology

## 2020-12-07 ENCOUNTER — Encounter: Payer: Self-pay | Admitting: Hematology

## 2020-12-07 ENCOUNTER — Inpatient Hospital Stay: Payer: 59 | Attending: Hematology | Admitting: Hematology

## 2020-12-07 VITALS — BP 128/86 | HR 108 | Temp 97.9°F | Resp 17 | Ht 64.0 in | Wt 214.6 lb

## 2020-12-07 DIAGNOSIS — F418 Other specified anxiety disorders: Secondary | ICD-10-CM | POA: Insufficient documentation

## 2020-12-07 DIAGNOSIS — Z510 Encounter for antineoplastic radiation therapy: Secondary | ICD-10-CM | POA: Insufficient documentation

## 2020-12-07 DIAGNOSIS — R21 Rash and other nonspecific skin eruption: Secondary | ICD-10-CM | POA: Insufficient documentation

## 2020-12-07 DIAGNOSIS — Z171 Estrogen receptor negative status [ER-]: Secondary | ICD-10-CM

## 2020-12-07 DIAGNOSIS — Z803 Family history of malignant neoplasm of breast: Secondary | ICD-10-CM | POA: Insufficient documentation

## 2020-12-07 DIAGNOSIS — R232 Flushing: Secondary | ICD-10-CM | POA: Insufficient documentation

## 2020-12-07 DIAGNOSIS — C50112 Malignant neoplasm of central portion of left female breast: Secondary | ICD-10-CM | POA: Diagnosis not present

## 2020-12-07 DIAGNOSIS — Z95828 Presence of other vascular implants and grafts: Secondary | ICD-10-CM

## 2020-12-07 DIAGNOSIS — K219 Gastro-esophageal reflux disease without esophagitis: Secondary | ICD-10-CM | POA: Insufficient documentation

## 2020-12-07 DIAGNOSIS — L598 Other specified disorders of the skin and subcutaneous tissue related to radiation: Secondary | ICD-10-CM | POA: Insufficient documentation

## 2020-12-07 DIAGNOSIS — Z79899 Other long term (current) drug therapy: Secondary | ICD-10-CM | POA: Insufficient documentation

## 2020-12-07 DIAGNOSIS — Z170 Estrogen receptor positive status [ER+]: Secondary | ICD-10-CM | POA: Insufficient documentation

## 2020-12-07 DIAGNOSIS — M545 Low back pain, unspecified: Secondary | ICD-10-CM | POA: Insufficient documentation

## 2020-12-07 DIAGNOSIS — Z8616 Personal history of COVID-19: Secondary | ICD-10-CM | POA: Insufficient documentation

## 2020-12-07 LAB — CBC WITH DIFFERENTIAL (CANCER CENTER ONLY)
Abs Immature Granulocytes: 0.01 10*3/uL (ref 0.00–0.07)
Basophils Absolute: 0 10*3/uL (ref 0.0–0.1)
Basophils Relative: 0 %
Eosinophils Absolute: 0 10*3/uL (ref 0.0–0.5)
Eosinophils Relative: 1 %
HCT: 35.2 % — ABNORMAL LOW (ref 36.0–46.0)
Hemoglobin: 11.2 g/dL — ABNORMAL LOW (ref 12.0–15.0)
Immature Granulocytes: 0 %
Lymphocytes Relative: 15 %
Lymphs Abs: 0.5 10*3/uL — ABNORMAL LOW (ref 0.7–4.0)
MCH: 29.6 pg (ref 26.0–34.0)
MCHC: 31.8 g/dL (ref 30.0–36.0)
MCV: 92.9 fL (ref 80.0–100.0)
Monocytes Absolute: 0.6 10*3/uL (ref 0.1–1.0)
Monocytes Relative: 18 %
Neutro Abs: 2.1 10*3/uL (ref 1.7–7.7)
Neutrophils Relative %: 66 %
Platelet Count: 196 10*3/uL (ref 150–400)
RBC: 3.79 MIL/uL — ABNORMAL LOW (ref 3.87–5.11)
RDW: 18 % — ABNORMAL HIGH (ref 11.5–15.5)
WBC Count: 3.2 10*3/uL — ABNORMAL LOW (ref 4.0–10.5)
nRBC: 0 % (ref 0.0–0.2)

## 2020-12-07 LAB — CMP (CANCER CENTER ONLY)
ALT: 32 U/L (ref 0–44)
AST: 23 U/L (ref 15–41)
Albumin: 3.4 g/dL — ABNORMAL LOW (ref 3.5–5.0)
Alkaline Phosphatase: 195 U/L — ABNORMAL HIGH (ref 38–126)
Anion gap: 10 (ref 5–15)
BUN: 19 mg/dL (ref 6–20)
CO2: 24 mmol/L (ref 22–32)
Calcium: 9.4 mg/dL (ref 8.9–10.3)
Chloride: 106 mmol/L (ref 98–111)
Creatinine: 0.78 mg/dL (ref 0.44–1.00)
GFR, Estimated: >60 mL/min (ref >60)
Glucose, Bld: 91 mg/dL (ref 70–99)
Potassium: 4.2 mmol/L (ref 3.5–5.1)
Sodium: 140 mmol/L (ref 135–145)
Total Bilirubin: 0.3 mg/dL (ref 0.3–1.2)
Total Protein: 7 g/dL (ref 6.5–8.1)

## 2020-12-07 MED ORDER — DOXYCYCLINE HYCLATE 100 MG PO TABS: 100.0000 mg | ORAL_TABLET | Freq: Two times a day (BID) | ORAL | 0 refills | Status: DC

## 2020-12-07 MED ORDER — SODIUM CHLORIDE 0.9% FLUSH
10.0000 mL | Freq: Once | INTRAVENOUS | Status: AC
  Administered 2020-12-07: 10 mL
  Filled 2020-12-07: qty 10

## 2020-12-07 MED ORDER — HEPARIN SOD (PORK) LOCK FLUSH 100 UNIT/ML IV SOLN
500.0000 [IU] | Freq: Once | INTRAVENOUS | Status: AC
Start: 2020-12-07 — End: 2020-12-07
  Administered 2020-12-07: 500 [IU]
  Filled 2020-12-07: qty 5

## 2020-12-07 NOTE — Progress Notes (Signed)
[place]   Telephone:(336) [phone] [phone]) [phone]   Clinic Follow up Note   Patient Care Team: [name], MD as PCP - General (Family Medicine) Mauro Kaufmann, RN as Oncology Nurse Navigator Rockwell Germany, RN as Oncology Nurse Navigator [name], MD as Consulting Physician (General Surgery) Truitt Merle, MD as Consulting Physician (Hematology) Gery Pray, MD as Consulting Physician (Radiation Oncology)  Date of Service:  12/07/2020  CHIEF COMPLAINT: f/u of left breast cancer, symptom management  SUMMARY OF ONCOLOGIC HISTORY: Oncology History Overview Note  Cancer Staging Cancer of central portion of left female breast [facility]) Staging form: Breast, AJCC 8th Edition - Clinical: No stage assigned - Unsigned    Cancer of central portion of left female breast ([place])  02/23/2020 Mammogram   Diagnostic Mammogram 02/23/20  IMPRESSION The 2x1x2.6cm irregular mass in teh left breast at 12:00 posiiton middle depth is highly suspicious of malignancy. An [place] is recommended for further evaluation and biopsy planning purposes.   02/23/2020 Breast US   [name] 02/23/20  IMPRESSION 2 adjacent spiculated masses in the left brast at 12:00 position 3 cm from the nipple (2.1x0.9x1.1cm and 1.1x1.4x0.5cm) is suggestive of malignancy.   Multiple abnormal left subpectoral and left axillary nodes measuring 3.3x2.1 cm concerning for metastatic adenopathy.    Left breast skin thickening and edema may be secondary congestive edema due to extensive axillary adenopathy.    03/08/2020 Initial Biopsy   Diagnosis 1.Breast, left, needle core biopsy, 12:00 position, 3cmfn -INVASIVE DUCTAL CARCINOMA -SEE COMMENT  2. Lymph node, needle/core biopsy, left axilla -METASTATIC CARCINOMA INVOLVING A LYMPH NODE  -LYMPHOVASCULAR SPACE INVASION PRESENT   Microscopic Comment  1.Based on the biopsy the carcinoma appears Nottingham Grade 3 or 3 and measures 1 cm in the  greatest linear extent.    03/08/2020 Receptors her2   ER- Negative 0% PR - Negative 0% HER2 - Negative  KI 67 - 80%    03/08/2020 Cancer Staging   Staging form: Breast, AJCC 8th Edition - Clinical stage from 03/08/2020: Stage IIIB (cT2, cN1, cM0, G3, ER-, PR-, HER2-) - Signed by Truitt Merle, MD on 03/10/2020   03/10/2020 Initial Diagnosis   Cancer of central portion of left female breast ([place])   03/16/2020 Breast MRI   IMPRESSION: 1. 8.1 x 7.8 x 6.6 cm biopsy proven invasive ductal carcinoma in the central right breast, involving 3 quadrants. 2. 3.0 x 1.7 x 1.1 cm satellite mass more inferiorly in lower inner quadrant of the left breast, compatible with additional malignancy. 3. Metastatic level 1 and level 2 left axillary lymph nodes. 4. No evidence of malignancy on the right.   03/17/2020 Imaging   IMPRESSION: CT CAP w contrast  1. Diffuse skin thickening in the left breast with left axillary and subpectoral lymphadenopathy, as well as a mildly enlarged left supraclavicular lymph node, which likely represents metastatic lymphadenopathy. No other definite extra nodal metastatic disease noted elsewhere in the chest, abdomen or pelvis. 2. Large mass in the central anatomic pelvis which is of uncertain origin, potentially a large exophytic fibroid or a large solid mass arising from the right ovary. Further evaluation with pelvic ultrasound is strongly recommended.   03/21/2020 Imaging   Bone Scan  IMPRESSION: Apparent arthropathy at L5. No bony metastatic disease is demonstrable on this study. Scattered foci of abnormal uptake in a pelvic mass is of uncertain etiology given absence of calcification in this mass by CT. This mass compresses the urinary bladder. It  is possible that some of the increased uptake in this area actually represents physiologic uptake within the bladder.   Kidneys noted in flank positions bilaterally.     03/22/2020 - 08/16/2020 Neo-Adjuvant Chemotherapy    Neoadjuvant Adriamycin and Cytoxan q2weeks for 4 cycles starting 03/22/20-05/03/20 followed by weekly Taxol and Carboplatin for 12 weeks starting 05/17/20-08/16/20   03/24/2020 Imaging   US Pelvis  IMPRESSION: 1. Large pedunculated lesion directly contiguous with the uterine most suggestive of a large subserosal fibroid which measures up to 8.2 cm. 2. Additional 1 cm probable intramural fibroid in the right anterior uterine body. 3. No other acute or worrisome pelvic abnormality.   03/29/2020 Genetic Testing   Negative genetic testing on the common hereditary cancer panel.  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  The Common Hereditary Gene Panel offered by [facility] includes sequencing and/or deletion duplication testing of the following 47 genes: APC, ATM, AXIN2, BARD1, BMPR1A, BRCA1, BRCA2, BRIP1, CDH1, CDK4, CDKN2A (p14ARF), CDKN2A (p16INK4a), CHEK2, CTNNA1, DICER1, EPCAM (Deletion/duplication testing only), GREM1 (promoter region deletion/duplication testing only), KIT, MEN1, MLH1, MSH2, MSH3, MSH6, MUTYH, NBN, NF1, NHTL1, PALB2, PDGFRA, PMS2, POLD1, POLE, PTEN, RAD50, RAD51C, RAD51D, SDHB, SDHC, SDHD, SMAD4, SMARCA4. STK11, TP53, TSC1, TSC2, and VHL.  The following genes were evaluated for sequence changes only: SDHA and HOXB13 c.251G>A variant only. The report date is 03/29/2020   04/28/2020 - 10/27/2020 Antibody Plan   Added Keytruda q3weeks starting 04/28/20 to complete 1 year of treatment. Held since 10/27/20 due to skin rash and body aches.    08/17/2020 Breast MRI   IMPRESSION: 1. The biopsy proven malignancy in the left breast and the adjacent satellite lesion have resolved in the interval. No abnormalities in these locations today. 2. No MRI evidence of malignancy in the right breast. 3. No adenopathy identified today   09/20/2020 Surgery   LEFT MODIFIED RADICAL MASTECTOMY by Dr Donne Hazel   09/20/2020 Pathology Results   FINAL MICROSCOPIC DIAGNOSIS:   A. BREAST, LEFT, MODIFIED RADICAL  MASTECTOMY:  - No residual invasive carcinoma in breast status post neoadjuvant  treatment.  - Metastatic carcinoma in (2) of (14) lymph nodes.  - Biopsy sites (one in breast, one in one of the positive lymph nodes).  - See oncology table.   B. ADDITIONAL AXILLARY CONTENTS, LEFT, DISSECTION:  - Metastatic carcinoma in (5) of (5) lymph nodes.    09/20/2020 Cancer Staging   Staging form: Breast, AJCC 8th Edition - Pathologic stage from 09/20/2020: No Stage Recommended (ypT0, pN2a, cM0, G3, ER-, PR-, HER2-) - Signed by Gardenia Phlegm, NP on 10/04/2020 Stage prefix: Post-therapy Histologic grading system: 3 grade system   10/30/2020 -  Radiation Therapy   Adjuvant Radiation with Dr Sondra Come starting 10/30/20   10/30/2020 -  Chemotherapy   Adjuvant Xeloda starting 10/30/20 at 1584m BID M-F while on RT.       CURRENT THERAPY:  Radiation therapy through 12/11/20  INTERVAL HISTORY:  AIVOREE FELMLEEis here for a follow up of breast cancer. She was last seen by me on 11/27/20 for recurrent rash. She presents to the clinic accompanied by her mom. She has a new lesion on her leg, which she believes is an infected spider bite, first noted a few days ago. Her mother experienced something similar about 6 months ago.   All other systems were reviewed with the patient and are negative.  MEDICAL HISTORY:  Past Medical History:  Diagnosis Date  . Anemia   . Anxiety   .  Arthritis   . Cancer (HCC)    Left Breast  . Depression   . GERD (gastroesophageal reflux disease)   . Headache   . Panic attack     SURGICAL HISTORY: Past Surgical History:  Procedure Laterality Date  . MASTECTOMY MODIFIED RADICAL Left 09/20/2020   Procedure: LEFT MODIFIED RADICAL MASTECTOMY;  Surgeon: [name], MD;  Location: [place];  Service: General;  Laterality: Left;  RNFA; PEC BLOCK;  . PORTACATH PLACEMENT Right 03/21/2020   Procedure: INSERTION PORT-A-CATH WITH ULTRASOUND GUIDANCE;  Surgeon: [name], MD;  Location: [place];  Service: General;  Laterality: Right;    I have reviewed the social history and family history with the patient and they are unchanged from previous note.  ALLERGIES:  is allergic to medroxyprogesterone.  MEDICATIONS:  Current Outpatient Medications  Medication Sig Dispense Refill  . doxycycline (VIBRA-TABS) 100 MG tablet Take 1 tablet (100 mg total) by mouth 2 (two) times daily. 10 tablet 0  . ALPRAZolam (XANAX) 1 MG tablet Take 1 mg by mouth 3 (three) times daily as needed for anxiety.     . baclofen (LIORESAL) 10 MG tablet Take 1 tablet (10 mg total) by mouth 2 (two) times daily as needed for muscle spasms. 30 each 0  . capecitabine (XELODA) 500 MG tablet Take 3 tablets (1,500 mg total) by mouth 2 (two) times daily after a meal. Take only on days of radiation Monday through Friday. 90 tablet 1  . citalopram (CELEXA) 20 MG tablet Take 20 mg by mouth daily.    Marland Kitchen dexamethasone (DECADRON) 4 MG tablet Take 2 tablets by mouth daily starting the day after Carboplatin and Cytoxan x 3 days. Take with food. 30 tablet 1  . diphenhydrAMINE (BENADRYL) 25 mg capsule Take 25 mg by mouth every 6 (six) hours as needed for allergies.    . diphenhydrAMINE HCl (ZZZQUIL) 50 MG/30ML LIQD Take 30 mLs by mouth at bedtime as needed (sleep).    . famotidine (PEPCID) 20 MG tablet Take 1 tablet (20 mg total) by mouth 2 (two) times daily. 60 tablet 1  . fluticasone (FLONASE) 50 MCG/ACT nasal spray Place 2 sprays into both nostrils daily as needed for allergies or rhinitis.    . furosemide (LASIX) 20 MG tablet Take 1 tablet (20 mg total) by mouth daily as needed for edema. 10 tablet 0  . gabapentin (NEURONTIN) 100 MG capsule Take 1 capsule (100 mg total) by mouth 3 (three) times daily as needed (hot flashes). May increase to 352m three times daily as need in a few weeks if tolerates well 90 capsule 1  . HYDROcodone-acetaminophen (NORCO/VICODIN) 5-325 MG tablet Take 1-2  tablets by mouth every 4 (four) hours as needed for moderate pain. Take with food. 84 tablet 0  . ibuprofen (ADVIL) 200 MG tablet Take 800 mg by mouth every 8 (eight) hours as needed for moderate pain.    .Marland Kitchenlidocaine-prilocaine (EMLA) cream Apply 1 application topically as needed (port access). Apply to affected area once 30 g 3  . magic mouthwash w/lidocaine SOLN Take 5 mLs by mouth 4 (four) times daily as needed for mouth pain. 480 mL 1  . methocarbamol (ROBAXIN) 500 MG tablet Take 1 tablet (500 mg total) by mouth every 6 (six) hours as needed for muscle spasms. 30 tablet 2  . methylPREDNISolone (MEDROL DOSEPAK) 4 MG TBPK tablet Take 1 tablet (4 mg total) by mouth taper from 4 doses each day to 1 dose and stop.  1 each 0  . metroNIDAZOLE (METROGEL) 0.75 % vaginal gel Place 1 Applicatorful vaginally 2 (two) times daily. 70 g 1  . montelukast (SINGULAIR) 10 MG tablet Take 1 tablet (10 mg total) by mouth at bedtime. 30 tablet 0  . ondansetron (ZOFRAN) 8 MG tablet Take 1 tablet (8 mg total) by mouth 2 (two) times daily as needed. Start on the third day after chemotherapy. 30 tablet 2  . predniSONE (DELTASONE) 10 MG tablet Take 1 tablet (10 mg total) by mouth daily with breakfast. 60 tablet 0  . prochlorperazine (COMPAZINE) 10 MG tablet Take 1 tablet (10 mg total) by mouth every 6 (six) hours as needed (Nausea or vomiting). 30 tablet 2  . traMADol (ULTRAM) 50 MG tablet Take 1 tablet (50 mg total) by mouth daily as needed for severe pain (headache). 30 tablet 0  . triamcinolone ointment (KENALOG) 0.5 % Apply 1 application topically 2 (two) times daily. 30 g 2   No current facility-administered medications for this visit.   Facility-Administered Medications Ordered in Other Visits  Medication Dose Route Frequency Provider Last Rate Last Admin  . sodium chloride flush (NS) 0.9 % injection 10 mL  10 mL Intravenous PRN Alla Feeling, NP   10 mL at 03/28/20 1104    PHYSICAL EXAMINATION: ECOG  PERFORMANCE STATUS: 1 - Symptomatic but completely ambulatory  Vitals:   12/07/20 1205  BP: 128/86  Pulse: (!) 108  Resp: 17  Temp: 97.9 F (36.6 C)  SpO2: 99%   Filed Weights   12/07/20 1205  Weight: 214 lb 9.6 oz (97.3 kg)    Due to COVID19 we will limit examination to appearance. Patient had no complaints.  GENERAL:alert, no distress and comfortable SKIN: (+) Diffuse skin erythema and Halberg pigmentation of the left chest and axilla secondary to radiation, with multiple skin peeling and shallow wounds.  Previous other rash in the upper chest, arms, neck and face are near resolved. EYES: normal, Conjunctiva are pink and non-injected, sclera clear  NEURO: alert & oriented x 3 with fluent speech  LABORATORY DATA:  I have reviewed the data as listed CBC Latest Ref Rng & Units 12/07/2020 11/27/2020 11/20/2020  WBC 4.0 - 10.5 K/uL 3.2(L) 6.7 5.2  Hemoglobin 12.0 - 15.0 g/dL 11.2(L) 12.6 12.1  Hematocrit 36.0 - 46.0 % 35.2(L) 38.1 37.8  Platelets 150 - 400 K/uL 196 206 225     CMP Latest Ref Rng & Units 12/07/2020 11/27/2020 11/20/2020  Glucose 70 - 99 mg/dL 91 73 105(H)  BUN 6 - 20 mg/dL 19 19 18   Creatinine 0.44 - 1.00 mg/dL 0.78 0.89 0.85  Sodium 135 - 145 mmol/L 140 138 138  Potassium 3.5 - 5.1 mmol/L 4.2 3.8 3.5  Chloride 98 - 111 mmol/L 106 104 104  CO2 22 - 32 mmol/L 24 25 29   Calcium 8.9 - 10.3 mg/dL 9.4 9.9 9.4  Total Protein 6.5 - 8.1 g/dL 7.0 7.5 7.1  Total Bilirubin 0.3 - 1.2 mg/dL 0.3 0.7 0.4  Alkaline Phos 38 - 126 U/L 195(H) 172(H) 158(H)  AST 15 - 41 U/L 23 18 18   ALT 0 - 44 U/L 32 29 26      RADIOGRAPHIC STUDIES: I have personally reviewed the radiological images as listed and agreed with the findings in the report. No results found.   ASSESSMENT & PLAN:  Caitlyn Hendricks is a 40 y.o. female with   1. Skin rashes on front upper chestand head, drug rashes and radiation dermatitis  -  She hashad intermittent rash since she started chemo for breast cancer,  possiblyan autoimmune response from [facility]. We have held [place] since 10/27/20. -Much improved since being off Keytruda and Xeloda. 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 Her 03/08/20 biopsy showedshe has grade III invasive ductal carcinoma of left breast metastatic to her left axillary LN. Her ER/PR/HER2 markers were all negative.CT CAP/Bone scan negative for distant mets. -To downstage disease and reduce risk of recurrence, she completed neoadjuvant chemo with ddAC q2weeksfor 4 cycles9/15/21-10/27/21. Followed by weekly carbo/taxolfor 12 weeks 05/17/20-2/9/22before proceeding with surgery. -Based onrecently publish Keynote 522 trial data,I added Keytruda q3weeks for 1 year treatment on 04/28/20.Tolerating well. -She underwent left mastectomy with Dr Donne Hazel on 09/20/20. Surgical path showsNo residual invasive carcinoma in breast status post neoadjuvant treatment,however has 7/19 positive LN. I discussed with this many positive LNs, she has veryhigh risk of cancer recurrence. -Istarted her on adjuvant chemotherapy with oral Xeloda for 6 months to reduce her risk of cancer recurrence. She will take with RT at lower dose 1551m BID M-F, otherwise will continue 2 weeks on/1 week off.StartedXeloda on 10/30/20.  -She unfortunately has had recurrent significant skin rash of upper body. I have held [place]27[place] and held Xeloda starting 11/20/20. Her skin biopsy from 11/17/20 with Dermatologist shows drug reaction. -Shestartedadjuvant radiation on 10/30/20 with Dr KSondra ComePlan to complete 12/11/20. She has moist  desquamation to the radiation area. She was given hydrocodone, which controls her pain well. -We again discussed possibly restarting Xeloda back after she completed radiation. She will see me in 2 weeks, and we will re-evaluate at that time.  3. Low back pain -She has hadnew onset low back pain. Her MRI Lumbar spine from 10/26/20 showedProgressive anterolisthesis and facet degeneration at L5-S1. Mild subarticular and foraminal stenosis bilaterally. -Will monitor.Will refer to orthopedics if gets worse  4. Genetic Testingnegative for pathogenetic mutations with VUS of POLE gene  5. Anxiety/Depression, Social Support  -She is on Xanax 120mTID and Celexa4027mnd low dose Ambien for sleep. -She has very good social supportfrom mother and boyfriend.She ownsa doggroomingbusiness -Mood is stable.  6. Hot flashes -I discussed chemo will put her in perimenopause, so symptoms of hot flashes can happen. Last full period with start of chemo(03/2020) -She is on Celexa 27m53mrrently.She is also onGabapentin 100mg85m flashes improved off chemotherapy.  7.COVID (+) on 08/02/20 with symptoms of Hoarsenessand ribcage pain. She has recovered quickly and completely   PLAN: -Continue to hold Keytruda and Xeloda -f/u in 2 weeks, discuss possibly restarting Xeloda at that visit -I prescribed doxycycline for her infected leg lesion today.    No problem-specific Assessment & Plan notes found for this encounter.   No orders of the defined types were placed in this encounter.  All questions were answered. The patient knows to call the clinic with any problems, questions or concerns. No barriers to learning was detected. The total time spent in the appointment was 20 minutes.     Caitlyn Hendricks FTruitt Merle6/08/2020   I, KatieWilburn Mylaracting as scribe for Adena Sima FTruitt Merle   I have reviewed the above documentation for accuracy and completeness, and I agree with the above.

## 2020-12-08 ENCOUNTER — Other Ambulatory Visit: Payer: Self-pay

## 2020-12-08 ENCOUNTER — Telehealth: Payer: Self-pay | Admitting: Hematology

## 2020-12-08 ENCOUNTER — Ambulatory Visit
Admission: RE | Admit: 2020-12-08 | Discharge: 2020-12-08 | Disposition: A | Discharge status: Home / Self Care | Payer: 59 | Source: Ambulatory Visit | Attending: Radiation Oncology | Admitting: Radiation Oncology

## 2020-12-08 ENCOUNTER — Ambulatory Visit: Payer: 59

## 2020-12-08 DIAGNOSIS — C50112 Malignant neoplasm of central portion of left female breast: Secondary | ICD-10-CM | POA: Diagnosis not present

## 2020-12-08 MED ORDER — SILVER SULFADIAZINE 1 % EX CREA
TOPICAL_CREAM | Freq: Once | CUTANEOUS | Status: AC
Start: 2020-12-08 — End: 2020-12-08

## 2020-12-08 NOTE — Telephone Encounter (Signed)
Scheduled follow-up appointment per 6/2 los. Patient is aware. 

## 2020-12-11 ENCOUNTER — Other Ambulatory Visit: Payer: Self-pay | Admitting: Hematology

## 2020-12-11 ENCOUNTER — Encounter: Payer: Self-pay | Admitting: *Deleted

## 2020-12-11 ENCOUNTER — Other Ambulatory Visit: Payer: Self-pay

## 2020-12-11 ENCOUNTER — Ambulatory Visit
Admission: RE | Admit: 2020-12-11 | Discharge: 2020-12-11 | Disposition: A | Discharge status: Home / Self Care | Payer: 59 | Source: Ambulatory Visit | Attending: Radiation Oncology | Admitting: Radiation Oncology

## 2020-12-11 ENCOUNTER — Ambulatory Visit: Payer: 59

## 2020-12-11 ENCOUNTER — Telehealth: Payer: Self-pay

## 2020-12-11 DIAGNOSIS — C50112 Malignant neoplasm of central portion of left female breast: Secondary | ICD-10-CM | POA: Diagnosis not present

## 2020-12-11 MED ORDER — CEPHALEXIN 500 MG PO CAPS: 500.0000 mg | ORAL_CAPSULE | Freq: Three times a day (TID) | ORAL | 0 refills | Status: DC

## 2020-12-11 MED ORDER — SILVER SULFADIAZINE 1 % EX CREA
TOPICAL_CREAM | Freq: Once | CUTANEOUS | Status: AC
Start: 2020-12-11 — End: 2020-12-11

## 2020-12-11 NOTE — Telephone Encounter (Signed)
Caitlyn Hendricks left vm stating the medication prescribed last week for the insect bites has not helped.  She is requesting something else.  She states her mother was prescribed cephalexin and it helped her bites.  Forwarded to Dr [name]

## 2020-12-13 ENCOUNTER — Encounter: Payer: Self-pay | Admitting: Radiation Oncology

## 2020-12-13 ENCOUNTER — Encounter: Payer: Self-pay | Admitting: Hematology

## 2020-12-17 NOTE — Progress Notes (Signed)
Radiation Oncology         [phone] ________________________________  Name: Caitlyn Hendricks MRN: [id number]  Date: 12/19/2020  DOB: 16-Aug-1980  Follow-Up Visit Note  CC: [name], MD  [name], MD   Diagnosis: C50.112-Malignant neoplasm of central portion of left female breast, No Stage Recommended (ypT0, pN2a, cM0, G3, ER-, PR-, HER2-)  Interval Since Last Radiation:  1 week, 1 day  Intent: Curative  Radiation Treatment Dates: 10/30/2020 through 12/11/2020 Site Technique Total Dose (Gy) Dose per Fx (Gy) Completed Fx Beam Energies  Chest Wall, Left: CW_Lt 3D 50/50 2 25/25 10X, 6X  Chest Wall, Left: CW_Lt_Bst Electron 10/10 2 5/5 6E  Axilla, Left: Axilla_Lt 3D 50/50 2 25/25 6X, 15X   Narrative:  The patient returns today for routine follow-up. During her last visit here on 10/18/20; the patient reported a rash that had developed to her upper chest and lower back region  The rash was determined to be likely infectious and she was placed on doxycycline.  On 11/09/20, the patient followed up with Dr. [name] in regards to her rash. It was noted that the patient's rash showed no improvement with doxycycline. Prednisone taper seemed to help clear up the rash for a short while, however, once the dosage was reduced to 15 mg daily, her rash worsened. She was increased to 40 mg Predisone for 3 days to see if rash would improve.  She followed up with Dr. [name] again on 11/20/20. The patient stated that her skin rash recurred further in her upper body even on steroids. She was seen by Dermatologist PA Valeda Malm in [place] on 11/17/20 for skin biopsy of the effected area.   Pathology results from skin biopsy taken on 11/17/20 showed interface dermatitis, focally excoriated; consistent with drug reaction (from both specimens collected).   During her visit on 12/07/19 for daily radiation treatment, the patient reported having severe pain to her left chest wall rated at a 9 on a  scale of 1-10. She stated that the pain felt like she had a lighter to her chest. Upon physical examination, moist superficial desquamation in the left CW UIQ and SCV area were seen. Widespread erythema/rash was seen in RT fields, as well as dermatitis to her upper left back. The mastectomy scar seemed to be the least affected area. She was instructed to apply Silvadene BID to the peeling areas, and was provided with extra padding and bandage/tape supplies. She was prescribed hydrocodone/acet for pain.      The patient again presented to Dr. [name] on 12/07/20 with reports of a new lesion on her leg. The patient believed it to be an infected spider bite ( her mother experienced something similar around 6 months ago). Dr. [name] prescribed doxycycline for the infected leg lesion.                     Of note: The patient received an MRI of  lumbar spine on 10/26/20 which showed progressive anterolisthesis and facet degeneration at L5-S1. As well as mild subarticular and foraminal stenosis bilaterally.   Allergies:  is allergic to medroxyprogesterone.  Meds: Current Outpatient Medications  Medication Sig Dispense Refill   ALPRAZolam (XANAX) 1 MG tablet Take 1 mg by mouth 3 (three) times daily as needed for anxiety.      cephALEXin (KEFLEX) 500 MG capsule Take 1 capsule (500 mg total) by mouth 3 (three) times daily. 21 capsule 0   citalopram (CELEXA) 20 MG tablet  Take 20 mg by mouth daily.     diphenhydrAMINE (BENADRYL) 25 mg capsule Take 25 mg by mouth every 6 (six) hours as needed for allergies.     diphenhydrAMINE HCl (ZZZQUIL) 50 MG/30ML LIQD Take 30 mLs by mouth at bedtime as needed (sleep).     famotidine (PEPCID) 20 MG tablet Take 1 tablet (20 mg total) by mouth 2 (two) times daily. 60 tablet 1   fluticasone (FLONASE) 50 MCG/ACT nasal spray Place 2 sprays into both nostrils daily as needed for allergies or rhinitis.     gabapentin (NEURONTIN) 100 MG capsule Take 1 capsule (100 mg total) by mouth  3 (three) times daily as needed (hot flashes). May increase to 385m three times daily as need in a few weeks if tolerates well 90 capsule 1   ibuprofen (ADVIL) 200 MG tablet Take 800 mg by mouth every 8 (eight) hours as needed for moderate pain.     lidocaine-prilocaine (EMLA) cream Apply 1 application topically as needed (port access). Apply to affected area once 30 g 3   methocarbamol (ROBAXIN) 500 MG tablet Take 1 tablet (500 mg total) by mouth every 6 (six) hours as needed for muscle spasms. 30 tablet 2   montelukast (SINGULAIR) 10 MG tablet Take 1 tablet (10 mg total) by mouth at bedtime. 30 tablet 0   prochlorperazine (COMPAZINE) 10 MG tablet Take 1 tablet (10 mg total) by mouth every 6 (six) hours as needed (Nausea or vomiting). 30 tablet 2   traMADol (ULTRAM) 50 MG tablet Take 1 tablet (50 mg total) by mouth daily as needed for severe pain (headache). 30 tablet 0   baclofen (LIORESAL) 10 MG tablet Take 1 tablet (10 mg total) by mouth 2 (two) times daily as needed for muscle spasms. (Patient not taking: Reported on 12/19/2020) 30 each 0   capecitabine (XELODA) 500 MG tablet Take 3 tablets (1,500 mg total) by mouth 2 (two) times daily after a meal. Take only on days of radiation Monday through Friday. (Patient not taking: Reported on 12/19/2020) 90 tablet 1   dexamethasone (DECADRON) 4 MG tablet Take 2 tablets by mouth daily starting the day after Carboplatin and Cytoxan x 3 days. Take with food. (Patient not taking: Reported on 12/19/2020) 30 tablet 1   furosemide (LASIX) 20 MG tablet Take 1 tablet (20 mg total) by mouth daily as needed for edema. (Patient not taking: Reported on 12/19/2020) 10 tablet 0   HYDROcodone-acetaminophen (NORCO/VICODIN) 5-325 MG tablet Take 1 tablet by mouth every 4 (four) hours as needed for moderate pain. Take with food. 30 tablet 0   magic mouthwash w/lidocaine SOLN Take 5 mLs by mouth 4 (four) times daily as needed for mouth pain. (Patient not taking: Reported on  12/19/2020) 480 mL 1   methylPREDNISolone (MEDROL DOSEPAK) 4 MG TBPK tablet Take 1 tablet (4 mg total) by mouth taper from 4 doses each day to 1 dose and stop. (Patient not taking: Reported on 12/19/2020) 1 each 0   metroNIDAZOLE (METROGEL) 0.75 % vaginal gel Place 1 Applicatorful vaginally 2 (two) times daily. (Patient not taking: Reported on 12/19/2020) 70 g 1   triamcinolone ointment (KENALOG) 0.5 % Apply 1 application topically 2 (two) times daily. (Patient not taking: Reported on 12/19/2020) 30 g 2   No current facility-administered medications for this encounter.   Facility-Administered Medications Ordered in Other Encounters  Medication Dose Route Frequency Provider Last Rate Last Admin   sodium chloride flush (NS) 0.9 % injection 10 mL  10 mL Intravenous PRN Alla Feeling, NP   10 mL at 03/28/20 1104    Physical Findings: The patient is in no acute distress. Patient is alert and oriented.  height is _0  (1.626 m) and weight is 212 lb 6.4 oz (96.3 kg). Her temperature is 97.6 F (36.4 C). Her blood pressure is 108/89 and her pulse is 101 (abnormal). Her respiration is 20 and oxygen saturation is 100%. .   Lungs are clear to auscultation bilaterally. Heart has regular rate and rhythm. No palpable cervical, supraclavicular, or axillary adenopathy. Abdomen soft, non-tender, normal bowel sounds. Overall the diffuse rash has improved in  affected areas.  Along the left chest wall and axillary area the patient has healing moist desquamation with new islands of skin growth.    No active infection noted.  Lab Findings: Lab Results  Component Value Date   WBC 3.2 (L) 12/07/2020   HGB 11.2 (L) 12/07/2020   HCT 35.2 (L) 12/07/2020   MCV 92.9 12/07/2020   PLT 196 12/07/2020    Radiographic Findings: No results found.  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  She feels better overall and is pleased with her healing thus far.  Patient continues to have pain interfering with her life and sleep and I refilled her hydrocodone, #30  Plan: Patient will return for close follow-up in 2 weeks.  I recommended she continue using the Silvadene in the affected areas.   ____________________________________  Blair Promise, PhD, MD   This document serves as a record of services personally performed by Gery Pray, MD. It was created on his behalf by Roney Mans, a trained medical scribe. The creation of this record is based on the scribe's personal observations and the provider's statements to them. This document has been checked and approved by the attending provider.

## 2020-12-18 ENCOUNTER — Ambulatory Visit: Payer: 59 | Attending: "Endocrinology | Admitting: Physical Therapy

## 2020-12-18 ENCOUNTER — Other Ambulatory Visit: Payer: Self-pay

## 2020-12-18 DIAGNOSIS — Z483 Aftercare following surgery for neoplasm: Secondary | ICD-10-CM | POA: Insufficient documentation

## 2020-12-18 NOTE — Therapy (Signed)
[facility] [place], [place], [address] Phone: [phone]   Fax:  [phone]  Physical Therapy Treatment  Patient Details  Name: Caitlyn Hendricks MRN: [id number] Date of Birth: [birth year] Referring Provider (PT): Donne Hazel   Encounter Date: 12/18/2020   PT End of Session - 12/18/20 1528     Visit Number 5   not changed due to screen   PT Start Time 1528    PT Stop Time 1534    PT Time Calculation (min) 6 min             Past Medical History:  Diagnosis Date   Anemia    Anxiety    Arthritis    Cancer ([place])    Left Breast   Depression    GERD (gastroesophageal reflux disease)    Headache    History of radiation therapy 10/30/20-12/11/20   left breast- Dr. Gery Pray   Panic attack     Past Surgical History:  Procedure Laterality Date   MASTECTOMY MODIFIED RADICAL Left 09/20/2020   Procedure: LEFT MODIFIED RADICAL MASTECTOMY;  Surgeon: [name], MD;  Location: [facility];  Service: General;  Laterality: Left;  RNFA; PEC BLOCK;   PORTACATH PLACEMENT Right 03/21/2020   Procedure: INSERTION PORT-A-CATH WITH ULTRASOUND GUIDANCE;  Surgeon: [name], MD;  Location: [facility];  Service: General;  Laterality: Right;    There were no vitals filed for this visit.   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, L mastectomy and ALND (7/19) on 09/20/20 - pt has completed IV chemo, has to start taking a chemo pill, pt will begin radiation soon                    L-DEX FLOWSHEETS - 12/18/20 1500       L-DEX LYMPHEDEMA SCREENING   Measurement Type Unilateral    L-DEX MEASUREMENT EXTREMITY Upper Extremity    POSITION  Standing    DOMINANT SIDE Right    At Risk Side Left    BASELINE SCORE (UNILATERAL) 1.4    L-DEX SCORE (UNILATERAL)  -0.4                                    PT Long Term Goals - 10/11/20 1529       PT LONG TERM GOAL #1   Title Pt will return to baseline shoulder ROM measurements post op to allow pt to return to PLOF.    Time 8    Period Months    Status On-going      PT LONG TERM GOAL #2   Title Pt will demonstrate 170 degrees of L shoulder flexion to allow her to reach overhead.    Baseline 113    Time 4    Period Weeks    Status New    Target Date 11/08/20      PT LONG TERM GOAL #3   Title Pt will demonstrate 170 degrees of L shoulder abduction to allow her to reach out to the side.    Baseline 96    Time 4    Period Weeks    Status New    Target Date 11/08/20      PT LONG TERM GOAL #4  Title Pt will report a 75% improvement in L scapular pain to allow improved comfort.    Time 4    Period Weeks    Status New    Target Date 11/08/20      PT LONG TERM GOAL #5   Title Pt will report a 75% improvement in L arm pain due to cording to allow improved comfort and mobility.    Time 4    Period Weeks    Status New    Target Date 11/08/20      Additional Long Term Goals   Additional Long Term Goals Yes      PT LONG TERM GOAL #6   Title Pt will be independent in a home exercise program for continued strengthening and stretching.    Time 4    Period Weeks    Status New    Target Date 11/08/20                   Plan - 12/18/20 1534     Clinical Impression Statement Pt comes back for her SOZO screen and the change from baseline is -1.8 so she is WNL despite feeling like she is still having swelling around her mastectomy site and being swollen from the steroids. She says she is still healing from radiation.  She will come back in 3 months for another screen and possibly get an order to have PT for MLD evaluation around the same time if she is still having trouble as she has long drive to get her and is worried about too many trips with gas prices.     PT Next Visit Plan continue  SOZO is scheduled. assess for trunk lymphedema if patient still having problems    Consulted and Agree with Plan of Care Patient             Patient will benefit from skilled therapeutic intervention in order to improve the following deficits and impairments:  Decreased strength  Visit Diagnosis: Aftercare following surgery for neoplasm     Problem List Patient Active Problem List   Diagnosis Date Noted   S/P mastectomy, left 09/20/2020   Genetic testing 08/03/2020   Port-A-Cath in place 05/31/2020   Cancer of central portion of left female breast ([place]) 03/10/2020   Donato Heinz. Owens Shark PT  [place] Levo 12/18/2020, 3:40 PM  [facility] [place], [place], [address] Phone: [phone]   Fax:  [phone]  Name: KENLEY RETTINGER MRN: [id number] Date of Birth: 1980-10-21

## 2020-12-19 ENCOUNTER — Encounter: Payer: Self-pay | Admitting: Radiation Oncology

## 2020-12-19 ENCOUNTER — Ambulatory Visit
Admission: RE | Admit: 2020-12-19 | Discharge: 2020-12-19 | Disposition: A | Discharge status: Home / Self Care | Payer: 59 | Source: Ambulatory Visit | Attending: Radiation Oncology | Admitting: Radiation Oncology

## 2020-12-19 DIAGNOSIS — C50112 Malignant neoplasm of central portion of left female breast: Secondary | ICD-10-CM | POA: Diagnosis present

## 2020-12-19 DIAGNOSIS — Z79899 Other long term (current) drug therapy: Secondary | ICD-10-CM | POA: Insufficient documentation

## 2020-12-19 DIAGNOSIS — Z171 Estrogen receptor negative status [ER-]: Secondary | ICD-10-CM | POA: Diagnosis not present

## 2020-12-19 DIAGNOSIS — R21 Rash and other nonspecific skin eruption: Secondary | ICD-10-CM | POA: Diagnosis not present

## 2020-12-19 DIAGNOSIS — M4317 Spondylolisthesis, lumbosacral region: Secondary | ICD-10-CM | POA: Insufficient documentation

## 2020-12-19 DIAGNOSIS — Z923 Personal history of irradiation: Secondary | ICD-10-CM | POA: Insufficient documentation

## 2020-12-19 HISTORY — DX: Personal history of irradiation: Z92.3

## 2020-12-19 MED ORDER — HYDROCODONE-ACETAMINOPHEN 5-325 MG PO TABS: 1.0000 | ORAL_TABLET | ORAL | 0 refills | Status: DC | PRN

## 2020-12-19 NOTE — Progress Notes (Signed)
Caitlyn Hendricks is here today for follow up post radiation to the breast.   Breast Side:left side   They completed their radiation on: 12/11/20  Does the patient complain of any of the following: Post radiation skin issues: Patient continues to have redness to left chest wall and axilla. Patient states, area feels much better. Breast Tenderness: yes, under surgical incision. Breast Swelling: yes , patient reports chest wall feeling tight. Lymphadema: no Range of Motion limitations: full range of motion. Fatigue post radiation: continues to have moderate fatigue. Appetite good/fair/poor: good  Additional comments if applicable:  Vitals:   67/20/94 1047  BP: 108/89  Pulse: (!) 101  Resp: 20  Temp: 97.6 F (36.4 C)  SpO2: 100%  Weight: 212 lb 6.4 oz (96.3 kg)  Height: 5\' 4"  (1.626 m)

## 2020-12-20 ENCOUNTER — Other Ambulatory Visit: Payer: Self-pay | Admitting: Hematology

## 2020-12-21 ENCOUNTER — Inpatient Hospital Stay: Payer: 59 | Admitting: Hematology

## 2020-12-21 ENCOUNTER — Encounter: Payer: Self-pay | Admitting: *Deleted

## 2020-12-21 ENCOUNTER — Telehealth: Payer: Self-pay | Admitting: Hematology

## 2020-12-21 NOTE — Telephone Encounter (Signed)
R/s per 6/16 sch msg, pt aware

## 2020-12-22 ENCOUNTER — Encounter: Payer: Self-pay | Admitting: Radiology

## 2020-12-26 ENCOUNTER — Telehealth: Payer: Self-pay | Admitting: Hematology

## 2020-12-26 NOTE — Telephone Encounter (Signed)
Left message with rescheduled upcoming appointment due to provider at [facility].

## 2020-12-28 ENCOUNTER — Other Ambulatory Visit: Payer: Self-pay

## 2020-12-28 ENCOUNTER — Ambulatory Visit: Payer: 59 | Admitting: Hematology

## 2020-12-28 ENCOUNTER — Encounter: Payer: Self-pay | Admitting: *Deleted

## 2020-12-28 ENCOUNTER — Inpatient Hospital Stay (HOSPITAL_BASED_OUTPATIENT_CLINIC_OR_DEPARTMENT_OTHER): Payer: 59 | Admitting: Hematology

## 2020-12-28 VITALS — BP 120/83 | HR 90 | Temp 97.4°F | Resp 18 | Ht 64.0 in | Wt 211.4 lb

## 2020-12-28 DIAGNOSIS — C50112 Malignant neoplasm of central portion of left female breast: Secondary | ICD-10-CM | POA: Diagnosis not present

## 2020-12-28 DIAGNOSIS — Z171 Estrogen receptor negative status [ER-]: Secondary | ICD-10-CM | POA: Diagnosis not present

## 2020-12-28 MED ORDER — CEPHALEXIN 500 MG PO CAPS
500.0000 mg | ORAL_CAPSULE | Freq: Three times a day (TID) | ORAL | 0 refills | Status: DC
Start: 2020-12-28 — End: 2021-01-29

## 2020-12-28 MED ORDER — PROCHLORPERAZINE MALEATE 10 MG PO TABS: 10.0000 mg | ORAL_TABLET | Freq: Four times a day (QID) | ORAL | 2 refills | Status: DC | PRN

## 2020-12-28 NOTE — Progress Notes (Signed)
Caitlyn Hendricks   [phone] [phone]   Clinic Follow up Note   Patient Care Team: [name], MD as PCP - General (Family Medicine) Mauro Kaufmann, RN as Oncology Nurse Navigator Rockwell Germany, RN as Oncology Nurse Navigator [name], MD as Consulting Physician (General Surgery) Truitt Merle, MD as Consulting Physician (Hematology) Gery Pray, MD as Consulting Physician (Radiation Oncology)  Date of Service:  12/28/2020  CHIEF COMPLAINT: f/u of left breast cancer  SUMMARY OF ONCOLOGIC HISTORY: Oncology History Overview Note  Cancer Staging Cancer of central portion of left female breast [facility]) Staging form: Breast, AJCC 8th Edition - Clinical: No stage assigned - Unsigned    Cancer of central portion of left female breast ([place])  02/23/2020 Mammogram   Diagnostic Mammogram 02/23/20  IMPRESSION The 2x1x2.6cm irregular mass in teh left breast at 12:00 posiiton middle depth is highly suspicious of malignancy. An [place] is recommended for further evaluation and biopsy planning purposes.   02/23/2020 Breast US   [name] 02/23/20  IMPRESSION 2 adjacent spiculated masses in the left brast at 12:00 position 3 cm from the nipple (2.1x0.9x1.1cm and 1.1x1.4x0.5cm) is suggestive of malignancy.   Multiple abnormal left subpectoral and left axillary nodes measuring 3.3x2.1 cm concerning for metastatic adenopathy.    Left breast skin thickening and edema may be secondary congestive edema due to extensive axillary adenopathy.    03/08/2020 Initial Biopsy   Diagnosis 1.Breast, left, needle core biopsy, 12:00 position, 3cmfn -INVASIVE DUCTAL CARCINOMA -SEE COMMENT  2. Lymph node, needle/core biopsy, left axilla -METASTATIC CARCINOMA INVOLVING A LYMPH NODE  -LYMPHOVASCULAR SPACE INVASION PRESENT   Microscopic Comment  1.Based on the biopsy the carcinoma appears Nottingham Grade 3 or 3 and measures 1 cm in the greatest linear extent.     03/08/2020 Receptors her2   ER- Negative 0% PR - Negative 0% HER2 - Negative  KI 67 - 80%    03/08/2020 Cancer Staging   Staging form: Breast, AJCC 8th Edition - Clinical stage from 03/08/2020: Stage IIIB (cT2, cN1, cM0, G3, ER-, PR-, HER2-) - Signed by Truitt Merle, MD on 03/10/2020    03/10/2020 Initial Diagnosis   Cancer of central portion of left female breast ([place])   03/16/2020 Breast MRI   IMPRESSION: 1. 8.1 x 7.8 x 6.6 cm biopsy proven invasive ductal carcinoma in the central right breast, involving 3 quadrants. 2. 3.0 x 1.7 x 1.1 cm satellite mass more inferiorly in lower inner quadrant of the left breast, compatible with additional malignancy. 3. Metastatic level 1 and level 2 left axillary lymph nodes. 4. No evidence of malignancy on the right.   03/17/2020 Imaging   IMPRESSION: CT CAP w contrast  1. Diffuse skin thickening in the left breast with left axillary and subpectoral lymphadenopathy, as well as a mildly enlarged left supraclavicular lymph node, which likely represents metastatic lymphadenopathy. No other definite extra nodal metastatic disease noted elsewhere in the chest, abdomen or pelvis. 2. Large mass in the central anatomic pelvis which is of uncertain origin, potentially a large exophytic fibroid or a large solid mass arising from the right ovary. Further evaluation with pelvic ultrasound is strongly recommended.   03/21/2020 Imaging   Bone Scan  IMPRESSION: Apparent arthropathy at L5. No bony metastatic disease is demonstrable on this study. Scattered foci of abnormal uptake in a pelvic mass is of uncertain etiology given absence of calcification in this mass by CT. This mass compresses the urinary bladder. It is  possible that some of the increased uptake in this area actually represents physiologic uptake within the bladder.   Kidneys noted in flank positions bilaterally.     03/22/2020 - 08/16/2020 Neo-Adjuvant Chemotherapy   Neoadjuvant Adriamycin and  Cytoxan q2weeks for 4 cycles starting 03/22/20-05/03/20 followed by weekly Taxol and Carboplatin for 12 weeks starting 05/17/20-08/16/20   03/24/2020 Imaging   US Pelvis  IMPRESSION: 1. Large pedunculated lesion directly contiguous with the uterine most suggestive of a large subserosal fibroid which measures up to 8.2 cm. 2. Additional 1 cm probable intramural fibroid in the right anterior uterine body. 3. No other acute or worrisome pelvic abnormality.   03/29/2020 Genetic Testing   Negative genetic testing on the common hereditary cancer panel.  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  The Common Hereditary Gene Panel offered by [facility] includes sequencing and/or deletion duplication testing of the following 47 genes: APC, ATM, AXIN2, BARD1, BMPR1A, BRCA1, BRCA2, BRIP1, CDH1, CDK4, CDKN2A (p14ARF), CDKN2A (p16INK4a), CHEK2, CTNNA1, DICER1, EPCAM (Deletion/duplication testing only), GREM1 (promoter region deletion/duplication testing only), KIT, MEN1, MLH1, MSH2, MSH3, MSH6, MUTYH, NBN, NF1, NHTL1, PALB2, PDGFRA, PMS2, POLD1, POLE, PTEN, RAD50, RAD51C, RAD51D, SDHB, SDHC, SDHD, SMAD4, SMARCA4. STK11, TP53, TSC1, TSC2, and VHL.  The following genes were evaluated for sequence changes only: SDHA and HOXB13 c.251G>A variant only. The report date is 03/29/2020   04/28/2020 - 10/27/2020 Antibody Plan   Added Keytruda q3weeks starting 04/28/20 to complete 1 year of treatment. Held since 10/27/20 due to skin rash and body aches.    08/17/2020 Breast MRI   IMPRESSION: 1. The biopsy proven malignancy in the left breast and the adjacent satellite lesion have resolved in the interval. No abnormalities in these locations today. 2. No MRI evidence of malignancy in the right breast. 3. No adenopathy identified today   09/20/2020 Surgery   LEFT MODIFIED RADICAL MASTECTOMY by Dr Donne Hazel   09/20/2020 Pathology Results   FINAL MICROSCOPIC DIAGNOSIS:   A. BREAST, LEFT, MODIFIED RADICAL MASTECTOMY:  - No residual  invasive carcinoma in breast status post neoadjuvant  treatment.  - Metastatic carcinoma in (2) of (14) lymph nodes.  - Biopsy sites (one in breast, one in one of the positive lymph nodes).  - See oncology table.   B. ADDITIONAL AXILLARY CONTENTS, LEFT, DISSECTION:  - Metastatic carcinoma in (5) of (5) lymph nodes.    09/20/2020 Cancer Staging   Staging form: Breast, AJCC 8th Edition - Pathologic stage from 09/20/2020: No Stage Recommended (ypT0, pN2a, cM0, G3, ER-, PR-, HER2-) - Signed by Gardenia Phlegm, NP on 10/04/2020  Stage prefix: Post-therapy  Histologic grading system: 3 grade system    10/30/2020 -  Radiation Therapy   Adjuvant Radiation with Dr Sondra Come starting 10/30/20   10/30/2020 -  Chemotherapy   Adjuvant Xeloda starting 10/30/20 at 1532m BID M-F while on RT.       CURRENT THERAPY:  Xeloda, restart 01/01/21  INTERVAL HISTORY:  Caitlyn PERAZAis here for a follow up of breast cancer. She was last seen by me on 12/07/20. She presents to the clinic accompanied by her boyfriend. She reports a mild rash to her left chest wall, but it is overall healing from radiation therapy. She notes she is still fatigued, at about 60%. She notes she tries to walk her dog, but the heat makes it difficulty. She notes another leg lesion, now on her left leg. No one else in her household has the bites. She notes she has topical  steroid cream but hasn't tried it.  All other systems were reviewed with the patient and are negative.  MEDICAL HISTORY:  Past Medical History:  Diagnosis Date   Anemia    Anxiety    Arthritis    Cancer ([place])    Left Breast   Depression    GERD (gastroesophageal reflux disease)    Headache    History of radiation therapy 10/30/20-12/11/20   left chest wall and axilla - Dr. Gery Pray   Panic attack     SURGICAL HISTORY: Past Surgical History:  Procedure Laterality Date   MASTECTOMY MODIFIED RADICAL Left 09/20/2020   Procedure: LEFT MODIFIED  RADICAL MASTECTOMY;  Surgeon: [name], MD;  Location: [place];  Service: General;  Laterality: Left;  RNFA; PEC BLOCK;   PORTACATH PLACEMENT Right 03/21/2020   Procedure: INSERTION PORT-A-CATH WITH ULTRASOUND GUIDANCE;  Surgeon: [name], MD;  Location: [place];  Service: General;  Laterality: Right;    I have reviewed the social history and family history with the patient and they are unchanged from previous note.  ALLERGIES:  is allergic to medroxyprogesterone.  MEDICATIONS:  Current Outpatient Medications  Medication Sig Dispense Refill   ALPRAZolam (XANAX) 1 MG tablet Take 1 mg by mouth 3 (three) times daily as needed for anxiety.      baclofen (LIORESAL) 10 MG tablet Take 1 tablet (10 mg total) by mouth 2 (two) times daily as needed for muscle spasms. (Patient not taking: Reported on 12/19/2020) 30 each 0   capecitabine (XELODA) 500 MG tablet Take 3 tablets (1,500 mg total) by mouth 2 (two) times daily after a meal. Take only on days of radiation Monday through Friday. (Patient not taking: Reported on 12/19/2020) 90 tablet 1   cephALEXin (KEFLEX) 500 MG capsule Take 1 capsule (500 mg total) by mouth 3 (three) times daily. 21 capsule 0   citalopram (CELEXA) 20 MG tablet Take 20 mg by mouth daily.     diphenhydrAMINE (BENADRYL) 25 mg capsule Take 25 mg by mouth every 6 (six) hours as needed for allergies.     diphenhydrAMINE HCl (ZZZQUIL) 50 MG/30ML LIQD Take 30 mLs by mouth at bedtime as needed (sleep).     famotidine (PEPCID) 20 MG tablet TAKE 1 TABLET BY MOUTH TWICE A DAY 60 tablet 1   fluticasone (FLONASE) 50 MCG/ACT nasal spray Place 2 sprays into both nostrils daily as needed for allergies or rhinitis.     gabapentin (NEURONTIN) 100 MG capsule Take 1 capsule (100 mg total) by mouth 3 (three) times daily as needed (hot flashes). May increase to 374m three times daily as need in a few weeks if tolerates well 90 capsule 1   HYDROcodone-acetaminophen  (NORCO/VICODIN) 5-325 MG tablet Take 1 tablet by mouth every 4 (four) hours as needed for moderate pain. Take with food. 30 tablet 0   ibuprofen (ADVIL) 200 MG tablet Take 800 mg by mouth every 8 (eight) hours as needed for moderate pain.     lidocaine-prilocaine (EMLA) cream Apply 1 application topically as needed (port access). Apply to affected area once 30 g 3   magic mouthwash w/lidocaine SOLN Take 5 mLs by mouth 4 (four) times daily as needed for mouth pain. (Patient not taking: Reported on 12/19/2020) 480 mL 1   montelukast (SINGULAIR) 10 MG tablet TAKE 1 TABLET BY MOUTH EVERYDAY AT BEDTIME 30 tablet 0   prochlorperazine (COMPAZINE) 10 MG tablet Take 1 tablet (10 mg total) by mouth every 6 (six) hours  as needed (Nausea or vomiting). 30 tablet 2   traMADol (ULTRAM) 50 MG tablet Take 1 tablet (50 mg total) by mouth daily as needed for severe pain (headache). 30 tablet 0   triamcinolone ointment (KENALOG) 0.5 % Apply 1 application topically 2 (two) times daily. (Patient not taking: Reported on 12/19/2020) 30 g 2   No current facility-administered medications for this visit.   Facility-Administered Medications Ordered in Other Visits  Medication Dose Route Frequency Provider Last Rate Last Admin   sodium chloride flush (NS) 0.9 % injection 10 mL  10 mL Intravenous PRN Alla Feeling, NP   10 mL at 03/28/20 1104    PHYSICAL EXAMINATION: ECOG PERFORMANCE STATUS: 1 - Symptomatic but completely ambulatory  Vitals:   12/28/20 1213  BP: 120/83  Pulse: 90  Resp: 18  Temp: (!) 97.4 F (36.3 C)  SpO2: 100%   Filed Weights   12/28/20 1213  Weight: 211 lb 6.4 oz (95.9 kg)    Due to COVID19 we will limit examination to appearance. Patient had no complaints.  GENERAL:alert, no distress and comfortable SKIN: skin color normal, no rashes or significant lesions EYES: normal, Conjunctiva are pink and non-injected, sclera clear  NEURO: alert & oriented x 3 with fluent speech  LABORATORY DATA:   I have reviewed the data as listed CBC Latest Ref Rng & Units 12/07/2020 11/27/2020 11/20/2020  WBC 4.0 - 10.5 K/uL 3.2(L) 6.7 5.2  Hemoglobin 12.0 - 15.0 g/dL 11.2(L) 12.6 12.1  Hematocrit 36.0 - 46.0 % 35.2(L) 38.1 37.8  Platelets 150 - 400 K/uL 196 206 225     CMP Latest Ref Rng & Units 12/07/2020 11/27/2020 11/20/2020  Glucose 70 - 99 mg/dL 91 73 105(H)  BUN 6 - 20 mg/dL 19 19 18   Creatinine 0.44 - 1.00 mg/dL 0.78 0.89 0.85  Sodium 135 - 145 mmol/L 140 138 138  Potassium 3.5 - 5.1 mmol/L 4.2 3.8 3.5  Chloride 98 - 111 mmol/L 106 104 104  CO2 22 - 32 mmol/L 24 25 29   Calcium 8.9 - 10.3 mg/dL 9.4 9.9 9.4  Total Protein 6.5 - 8.1 g/dL 7.0 7.5 7.1  Total Bilirubin 0.3 - 1.2 mg/dL 0.3 0.7 0.4  Alkaline Phos 38 - 126 U/L 195(H) 172(H) 158(H)  AST 15 - 41 U/L 23 18 18   ALT 0 - 44 U/L 32 29 26      RADIOGRAPHIC STUDIES: I have personally reviewed the radiological images as listed and agreed with the findings in the report. No results found.   ASSESSMENT & PLAN:  Caitlyn Hendricks is a 40 y.o. female with   1. Skin rashes on front upper chest and head, drug rashes, radiation dermatitis, skin lesions -She had several episodes of rash since she started chemo treatment, she had a skin biopsy which showed a drug-related dermatitis.   -She has residual dermatitis s/p radiation therapy, which she completed 12/11/20. The moist desquamation has resolved. -She still experiences breakouts on her chest.  -She has had three infected leg lesions within the last month. She is unsure of the cause, as no one else in her household has them. She was previously treated with doxycycline.   2. 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 Her  03/08/20 biopsy showed she has grade III invasive ductal carcinoma of left breast metastatic to her left axillary LN. Her ER/PR/HER2 markers were all negative. CT CAP/Bone scan negative for distant mets.  -To downstage disease and reduce risk of recurrence, she completed neoadjuvant chemo with ddAC q2weeks for 4 cycles 03/22/20-05/03/20. Followed by weekly carbo/taxol for 12 weeks 05/17/20-08/16/20 before proceeding with surgery.  -Based on recently publish Keynote 522 trial data, I added Keytruda q3weeks for 1 year treatment on 04/28/20.   -She underwent left mastectomy with Dr Donne Hazel on 09/20/20. Surgical path shows No residual invasive carcinoma in breast status post neoadjuvant treatment, however has 7/19 positive LN. I discussed with this many positive LNs, she has very high risk of cancer recurrence. -I started her on adjuvant chemotherapy with oral Xeloda for 6 months to reduce her risk of cancer recurrence on 10/30/20. -She unfortunately has had recurrent significant skin rash of upper body. I have held [place] [employer] from 10/27/20 and held Xeloda starting 11/20/20. Her skin biopsy from 11/17/20 with Dermatologist shows drug reaction. -She received adjuvant radiation with Dr Sondra Come from 10/30/20 through 12/11/20.  -We again discussed restarting Xeloda. She is in agreement; she will take two in the morning and three in the evening.   3. Anxiety/Depression, Social Support -She is on Xanax 82m TID and Celexa 421mand low dose Ambien for sleep.  -She has very good social support from mother and boyfriend. 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 -Mood is stable on Celexa, but she does note increasing issues concentrating.    4. Hot flashes -I discussed chemo will put her in perimenopause, so symptoms of hot flashes can happen. Last full period with start of chemo (03/2020) -She is on Celexa 4030murrently. She is also on Gabapentin 100m79mot flashes  improved off chemotherapy but are still present.   5. Genetic Testing negative for pathogenetic mutations with VUS of POLE gene   6. COVID (+) on 08/02/20 with symptoms of Hoarseness and ribcage pain. She has recovered quickly and completely       PLAN:  -Continue to hold Keytruda -I prescribed keflex for her leg lesion today -Start Xeloda on Monday 01/01/21, 1000mg61mam and 15mg 83mone week on and one week off for first cycle.  She will take Claritin, famotidine and Singulair to prevent allergy reaction when she is on Xarelto. -labs and f/u in 2 weeks before cycle 2, plan to increase dose on cycle 2      No problem-specific Assessment & Plan notes found for this encounter.   No orders of the defined types were placed in this encounter.  All questions were answered. The patient knows to call the clinic with any problems, questions or concerns. No barriers to learning was detected. The total time spent in the appointment was 30 minutes.     Ksean Vale FeTruitt Merle/23/2022   I, Katie Wilburn Mylarcting as scribe for Cristan Scherzer FeTruitt Merle  I have reviewed the above documentation for accuracy and completeness, and I agree with the above.

## 2020-12-29 ENCOUNTER — Telehealth: Payer: Self-pay | Admitting: Hematology

## 2020-12-29 NOTE — Telephone Encounter (Signed)
Scheduled follow-up appointment per 6/23 los. Patient is aware.

## 2021-01-02 ENCOUNTER — Ambulatory Visit
Admission: RE | Admit: 2021-01-02 | Discharge: 2021-01-02 | Disposition: A | Discharge status: Home / Self Care | Payer: 59 | Source: Ambulatory Visit | Attending: Radiation Oncology | Admitting: Radiation Oncology

## 2021-01-02 ENCOUNTER — Telehealth: Payer: Self-pay | Admitting: *Deleted

## 2021-01-02 NOTE — Telephone Encounter (Signed)
CALLED PATIENT TO ASK ABOUT RESCHEDULING TODAY'S FU, PATIENT AGREED TO COME IN ON 01-11-21 @ 9:15 AM

## 2021-01-09 NOTE — Progress Notes (Signed)
Radiation Oncology         [phone] ________________________________  Name: Caitlyn Hendricks MRN: [id number]  Date: 01/11/2021  DOB: 16-Mar-1981  Follow-Up Visit Note  CC: [name], MD  [name], MD    ICD-10-CM   1. Malignant neoplasm of central portion of left breast in female, estrogen receptor negative ([place])  C50.112    Z17.1       Diagnosis:  C50.112-Malignant neoplasm of central portion of left female breast, No Stage Recommended (ypT0, pN2a, cM0, G3, ER-, PR-, HER2-)  Interval Since Last Radiation:  1 month, and 1 day   Intent: Curative    Radiation Treatment Dates: 10/30/2020 through 12/11/2020 Site Technique Total Dose (Gy) Dose per Fx (Gy) Completed Fx Beam Energies  Chest Wall, Left: CW_Lt 3D 50/50 2 25/25 10X, 6X  Chest Wall, Left: CW_Lt_Bst Electron 10/10 2 5/5 6E  Axilla, Left: Axilla_Lt 3D 50/50 2 25/25 6X, 15X   Narrative:  The patient returns today for follow-up. When the patient was last seen here on 12/19/20, she reported to be pleased with her healing at the time although still experiencing pain to her chest area. She was instructed to continue using silvadene to the affected areas.  The patient most recently followed up with Dr. [name] on 12/28/20. From the visit, it was noted that the moist desquamation previously seen across the patient's chest has resolved, however, she is still experiencing breakouts on her chest. Also of note: the patient reported to Dr. [name] that she has had three infected leg lesions within the last month (one lesion was noted during her last visit on 12/19/20).    The patient reports this is being spider bites.  Patient did restart chemotherapy and unfortunately has had recurrence of her rash.  She will see Dr. [name] later today for evaluation of this issue.  The patient denies any pain along the left chest wall area this time.  She has noticed some very intermittent mild pain in the right breast.  She denies any nipple  discharge or bleeding.  She denies any problems with swelling in her left arm or hand.  She will undergo SOZO evaluation in the near future.                      Allergies:  is allergic to medroxyprogesterone.  Meds: Current Outpatient Medications  Medication Sig Dispense Refill   ALPRAZolam (XANAX) 1 MG tablet Take 1 mg by mouth 3 (three) times daily as needed for anxiety.      baclofen (LIORESAL) 10 MG tablet Take 1 tablet (10 mg total) by mouth 2 (two) times daily as needed for muscle spasms. (Patient not taking: Reported on 12/19/2020) 30 each 0   capecitabine (XELODA) 500 MG tablet Take 3 tablets (1,500 mg total) by mouth 2 (two) times daily after a meal. Take only on days of radiation Monday through Friday. (Patient not taking: Reported on 12/19/2020) 90 tablet 1   cephALEXin (KEFLEX) 500 MG capsule Take 1 capsule (500 mg total) by mouth 3 (three) times daily. 21 capsule 0   citalopram (CELEXA) 20 MG tablet Take 20 mg by mouth daily.     diphenhydrAMINE (BENADRYL) 25 mg capsule Take 25 mg by mouth every 6 (six) hours as needed for allergies.     diphenhydrAMINE HCl (ZZZQUIL) 50 MG/30ML LIQD Take 30 mLs by mouth at bedtime as needed (sleep).     famotidine (PEPCID) 20 MG tablet TAKE 1 TABLET  BY MOUTH TWICE A DAY 60 tablet 1   fluticasone (FLONASE) 50 MCG/ACT nasal spray Place 2 sprays into both nostrils daily as needed for allergies or rhinitis.     gabapentin (NEURONTIN) 100 MG capsule Take 1 capsule (100 mg total) by mouth 3 (three) times daily as needed (hot flashes). May increase to 311m three times daily as need in a few weeks if tolerates well 90 capsule 1   HYDROcodone-acetaminophen (NORCO/VICODIN) 5-325 MG tablet Take 1 tablet by mouth every 4 (four) hours as needed for moderate pain. Take with food. 30 tablet 0   ibuprofen (ADVIL) 200 MG tablet Take 800 mg by mouth every 8 (eight) hours as needed for moderate pain.     lidocaine-prilocaine (EMLA) cream Apply 1 application topically as  needed (port access). Apply to affected area once 30 g 3   magic mouthwash w/lidocaine SOLN Take 5 mLs by mouth 4 (four) times daily as needed for mouth pain. (Patient not taking: Reported on 12/19/2020) 480 mL 1   montelukast (SINGULAIR) 10 MG tablet TAKE 1 TABLET BY MOUTH EVERYDAY AT BEDTIME 30 tablet 0   prochlorperazine (COMPAZINE) 10 MG tablet Take 1 tablet (10 mg total) by mouth every 6 (six) hours as needed (Nausea or vomiting). 30 tablet 2   traMADol (ULTRAM) 50 MG tablet Take 1 tablet (50 mg total) by mouth daily as needed for severe pain (headache). 30 tablet 0   triamcinolone ointment (KENALOG) 0.5 % Apply 1 application topically 2 (two) times daily. (Patient not taking: Reported on 12/19/2020) 30 g 2   No current facility-administered medications for this encounter.   Facility-Administered Medications Ordered in Other Encounters  Medication Dose Route Frequency Provider Last Rate Last Admin   sodium chloride flush (NS) 0.9 % injection 10 mL  10 mL Intravenous PRN BAlla Feeling NP   10 mL at 03/28/20 1104    Physical Findings: The patient is in no acute distress. Patient is alert and oriented.  height is _0  (1.626 m) and weight is 217 lb 2 oz (98.5 kg). Her temporal temperature is 96.9 F (36.1 C) (abnormal). Her blood pressure is 117/84 and her pulse is 88. Her respiration is 18 and oxygen saturation is 100%. .   Lungs are clear to auscultation bilaterally. Heart has regular rate and rhythm. No palpable cervical, supraclavicular, or axillary adenopathy. Abdomen soft, non-tender, normal bowel sounds.  Right Breast: no palpable mass, nipple discharge or bleeding.  The left chest wall area has healed at this time.  No palpable or visible signs of recurrence.  Patient has a diffuse rash involving the left chest wall as  sternal region extending into the right breast region.  She also has this rash in the upper mid back region.    Lab Findings: Lab Results  Component Value Date    WBC 3.2 (L) 12/07/2020   HGB 11.2 (L) 12/07/2020   HCT 35.2 (L) 12/07/2020   MCV 92.9 12/07/2020   PLT 196 12/07/2020    Radiographic Findings: No results found.  Impression:  C50.112-Malignant neoplasm of central portion of left female breast, No Stage Recommended (ypT0, pN2a, cM0, G3, ER-, PR-, HER2-)   She has recovered well from her radiation reaction.  She continues to have problems with recurrent drug reaction as above.  No evidence of recurrence of breast cancer on clinical exam today  Plan: As needed follow-up in radiation oncology.  She will continue close follow-up with medical oncology    ____________________________________  JJeneen Rinks  Jabier Gauss, PhD, MD   This document serves as a record of services personally performed by Gery Pray, MD. It was created on his behalf by Roney Mans, a trained medical scribe. The creation of this record is based on the scribe's personal observations and the provider's statements to them. This document has been checked and approved by the attending provider.

## 2021-01-11 ENCOUNTER — Ambulatory Visit
Admission: RE | Admit: 2021-01-11 | Discharge: 2021-01-11 | Disposition: A | Discharge status: Home / Self Care | Payer: 59 | Source: Ambulatory Visit | Attending: Radiation Oncology | Admitting: Radiation Oncology

## 2021-01-11 ENCOUNTER — Inpatient Hospital Stay: Payer: 59 | Attending: Hematology | Admitting: Hematology

## 2021-01-11 ENCOUNTER — Inpatient Hospital Stay: Payer: 59

## 2021-01-11 ENCOUNTER — Other Ambulatory Visit: Payer: Self-pay

## 2021-01-11 ENCOUNTER — Other Ambulatory Visit: Payer: Self-pay | Admitting: Hematology

## 2021-01-11 VITALS — BP 123/89 | HR 93 | Temp 98.5°F | Resp 18 | Ht 64.0 in | Wt 218.2 lb

## 2021-01-11 VITALS — BP 117/84 | HR 88 | Temp 96.9°F | Resp 18 | Ht 64.0 in | Wt 217.1 lb

## 2021-01-11 DIAGNOSIS — C50112 Malignant neoplasm of central portion of left female breast: Secondary | ICD-10-CM | POA: Diagnosis not present

## 2021-01-11 DIAGNOSIS — Z5111 Encounter for antineoplastic chemotherapy: Secondary | ICD-10-CM | POA: Diagnosis present

## 2021-01-11 DIAGNOSIS — D649 Anemia, unspecified: Secondary | ICD-10-CM | POA: Insufficient documentation

## 2021-01-11 DIAGNOSIS — R232 Flushing: Secondary | ICD-10-CM | POA: Diagnosis not present

## 2021-01-11 DIAGNOSIS — M129 Arthropathy, unspecified: Secondary | ICD-10-CM | POA: Diagnosis not present

## 2021-01-11 DIAGNOSIS — Z171 Estrogen receptor negative status [ER-]: Secondary | ICD-10-CM

## 2021-01-11 DIAGNOSIS — F418 Other specified anxiety disorders: Secondary | ICD-10-CM | POA: Insufficient documentation

## 2021-01-11 DIAGNOSIS — Z95828 Presence of other vascular implants and grafts: Secondary | ICD-10-CM

## 2021-01-11 DIAGNOSIS — Z79899 Other long term (current) drug therapy: Secondary | ICD-10-CM | POA: Insufficient documentation

## 2021-01-11 DIAGNOSIS — Z923 Personal history of irradiation: Secondary | ICD-10-CM | POA: Insufficient documentation

## 2021-01-11 DIAGNOSIS — K219 Gastro-esophageal reflux disease without esophagitis: Secondary | ICD-10-CM | POA: Insufficient documentation

## 2021-01-11 LAB — CBC WITH DIFFERENTIAL (CANCER CENTER ONLY)
Abs Immature Granulocytes: 0.01 10*3/uL (ref 0.00–0.07)
Basophils Absolute: 0 10*3/uL (ref 0.0–0.1)
Basophils Relative: 0 %
Eosinophils Absolute: 0 10*3/uL (ref 0.0–0.5)
Eosinophils Relative: 1 %
HCT: 37.4 % (ref 36.0–46.0)
Hemoglobin: 12.2 g/dL (ref 12.0–15.0)
Immature Granulocytes: 0 %
Lymphocytes Relative: 12 %
Lymphs Abs: 0.6 10*3/uL — ABNORMAL LOW (ref 0.7–4.0)
MCH: 29.8 pg (ref 26.0–34.0)
MCHC: 32.6 g/dL (ref 30.0–36.0)
MCV: 91.4 fL (ref 80.0–100.0)
Monocytes Absolute: 0.5 10*3/uL (ref 0.1–1.0)
Monocytes Relative: 9 %
Neutro Abs: 3.8 10*3/uL (ref 1.7–7.7)
Neutrophils Relative %: 78 %
Platelet Count: 314 10*3/uL (ref 150–400)
RBC: 4.09 MIL/uL (ref 3.87–5.11)
RDW: 17.9 % — ABNORMAL HIGH (ref 11.5–15.5)
WBC Count: 4.9 10*3/uL (ref 4.0–10.5)
nRBC: 0 % (ref 0.0–0.2)

## 2021-01-11 LAB — CMP (CANCER CENTER ONLY)
ALT: 21 U/L (ref 0–44)
AST: 25 U/L (ref 15–41)
Albumin: 3.4 g/dL — ABNORMAL LOW (ref 3.5–5.0)
Alkaline Phosphatase: 185 U/L — ABNORMAL HIGH (ref 38–126)
Anion gap: 9 (ref 5–15)
BUN: 15 mg/dL (ref 6–20)
CO2: 28 mmol/L (ref 22–32)
Calcium: 9.5 mg/dL (ref 8.9–10.3)
Chloride: 103 mmol/L (ref 98–111)
Creatinine: 0.77 mg/dL (ref 0.44–1.00)
GFR, Estimated: >60 mL/min (ref >60)
Glucose, Bld: 101 mg/dL — ABNORMAL HIGH (ref 70–99)
Potassium: 4.1 mmol/L (ref 3.5–5.1)
Sodium: 140 mmol/L (ref 135–145)
Total Bilirubin: 0.6 mg/dL (ref 0.3–1.2)
Total Protein: 7.5 g/dL (ref 6.5–8.1)

## 2021-01-11 LAB — TSH: TSH: 1.255 u[IU]/mL (ref 0.308–3.960)

## 2021-01-11 MED ORDER — HEPARIN SOD (PORK) LOCK FLUSH 100 UNIT/ML IV SOLN
500.0000 [IU] | Freq: Once | INTRAVENOUS | Status: AC
  Administered 2021-01-11: 500 [IU]
  Filled 2021-01-11: qty 5

## 2021-01-11 MED ORDER — SODIUM CHLORIDE 0.9% FLUSH
10.0000 mL | Freq: Once | INTRAVENOUS | Status: AC
  Administered 2021-01-11: 10 mL
  Filled 2021-01-11: qty 10

## 2021-01-11 NOTE — Progress Notes (Signed)
Caitlyn Hendricks is here today for follow up post radiation to the breast.   Breast Side:left   They completed their radiation on: 12/11/2020   Does the patient complain of any of the following: Post radiation skin issues: rash to left breast, chest and left arm Breast Tenderness: no tenderness, occasional fleeting dull ache Breast Swelling: none Lymphedema: none Range of Motion limitations: Full range of motion Fatigue post radiation: Mild fatigue Appetite good/fair/poor: good  Additional comments if applicable: none  Vitals:   01/11/21 0913  BP: 117/84  Pulse: 88  Resp: 18  Temp: (!) 96.9 F (36.1 C)  TempSrc: Temporal  SpO2: 100%  Weight: 217 lb 2 oz (98.5 kg)  Height: 5\' 4"  (1.626 m)

## 2021-01-11 NOTE — Progress Notes (Signed)
[facility]   [phone] [phone]   Clinic Follow up Note   Patient Care Team: [name], MD as PCP - General (Family Medicine) Mauro Kaufmann, RN as Oncology Nurse Navigator Rockwell Germany, RN as Oncology Nurse Navigator [name], MD as Consulting Physician (General Surgery) Truitt Merle, MD as Consulting Physician (Hematology) Gery Pray, MD as Consulting Physician (Radiation Oncology)  Date of Service:  01/13/2021  CHIEF COMPLAINT: f/u of left breast cancer  SUMMARY OF ONCOLOGIC HISTORY: Oncology History Overview Note  Cancer Staging Cancer of central portion of left female breast [facility]) Staging form: Breast, AJCC 8th Edition - Clinical: No stage assigned - Unsigned    Cancer of central portion of left female breast ([place])  02/23/2020 Mammogram   Diagnostic Mammogram 02/23/20  IMPRESSION The 2x1x2.6cm irregular mass in teh left breast at 12:00 posiiton middle depth is highly suspicious of malignancy. An [place] is recommended for further evaluation and biopsy planning purposes.   02/23/2020 Breast US   [name] 02/23/20  IMPRESSION 2 adjacent spiculated masses in the left brast at 12:00 position 3 cm from the nipple (2.1x0.9x1.1cm and 1.1x1.4x0.5cm) is suggestive of malignancy.   Multiple abnormal left subpectoral and left axillary nodes measuring 3.3x2.1 cm concerning for metastatic adenopathy.    Left breast skin thickening and edema may be secondary congestive edema due to extensive axillary adenopathy.    03/08/2020 Initial Biopsy   Diagnosis 1.Breast, left, needle core biopsy, 12:00 position, 3cmfn -INVASIVE DUCTAL CARCINOMA -SEE COMMENT  2. Lymph node, needle/core biopsy, left axilla -METASTATIC CARCINOMA INVOLVING A LYMPH NODE  -LYMPHOVASCULAR SPACE INVASION PRESENT   Microscopic Comment  1.Based on the biopsy the carcinoma appears Nottingham Grade 3 or 3 and measures 1 cm in the greatest linear extent.     03/08/2020 Receptors her2   ER- Negative 0% PR - Negative 0% HER2 - Negative  KI 67 - 80%    03/08/2020 Cancer Staging   Staging form: Breast, AJCC 8th Edition - Clinical stage from 03/08/2020: Stage IIIB (cT2, cN1, cM0, G3, ER-, PR-, HER2-) - Signed by Truitt Merle, MD on 03/10/2020    03/10/2020 Initial Diagnosis   Cancer of central portion of left female breast ([place])   03/16/2020 Breast MRI   IMPRESSION: 1. 8.1 x 7.8 x 6.6 cm biopsy proven invasive ductal carcinoma in the central right breast, involving 3 quadrants. 2. 3.0 x 1.7 x 1.1 cm satellite mass more inferiorly in lower inner quadrant of the left breast, compatible with additional malignancy. 3. Metastatic level 1 and level 2 left axillary lymph nodes. 4. No evidence of malignancy on the right.   03/17/2020 Imaging   IMPRESSION: CT CAP w contrast  1. Diffuse skin thickening in the left breast with left axillary and subpectoral lymphadenopathy, as well as a mildly enlarged left supraclavicular lymph node, which likely represents metastatic lymphadenopathy. No other definite extra nodal metastatic disease noted elsewhere in the chest, abdomen or pelvis. 2. Large mass in the central anatomic pelvis which is of uncertain origin, potentially a large exophytic fibroid or a large solid mass arising from the right ovary. Further evaluation with pelvic ultrasound is strongly recommended.   03/21/2020 Imaging   Bone Scan  IMPRESSION: Apparent arthropathy at L5. No bony metastatic disease is demonstrable on this study. Scattered foci of abnormal uptake in a pelvic mass is of uncertain etiology given absence of calcification in this mass by CT. This mass compresses the urinary bladder. It is  possible that some of the increased uptake in this area actually represents physiologic uptake within the bladder.   Kidneys noted in flank positions bilaterally.     03/22/2020 - 08/16/2020 Neo-Adjuvant Chemotherapy   Neoadjuvant Adriamycin and  Cytoxan q2weeks for 4 cycles starting 03/22/20-05/03/20 followed by weekly Taxol and Carboplatin for 12 weeks starting 05/17/20-08/16/20   03/24/2020 Imaging   US Pelvis  IMPRESSION: 1. Large pedunculated lesion directly contiguous with the uterine most suggestive of a large subserosal fibroid which measures up to 8.2 cm. 2. Additional 1 cm probable intramural fibroid in the right anterior uterine body. 3. No other acute or worrisome pelvic abnormality.   03/29/2020 Genetic Testing   Negative genetic testing on the common hereditary cancer panel.  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  The Common Hereditary Gene Panel offered by [facility] includes sequencing and/or deletion duplication testing of the following 47 genes: APC, ATM, AXIN2, BARD1, BMPR1A, BRCA1, BRCA2, BRIP1, CDH1, CDK4, CDKN2A (p14ARF), CDKN2A (p16INK4a), CHEK2, CTNNA1, DICER1, EPCAM (Deletion/duplication testing only), GREM1 (promoter region deletion/duplication testing only), KIT, MEN1, MLH1, MSH2, MSH3, MSH6, MUTYH, NBN, NF1, NHTL1, PALB2, PDGFRA, PMS2, POLD1, POLE, PTEN, RAD50, RAD51C, RAD51D, SDHB, SDHC, SDHD, SMAD4, SMARCA4. STK11, TP53, TSC1, TSC2, and VHL.  The following genes were evaluated for sequence changes only: SDHA and HOXB13 c.251G>A variant only. The report date is 03/29/2020   04/28/2020 - 10/27/2020 Antibody Plan   Added Keytruda q3weeks starting 04/28/20 to complete 1 year of treatment. Held since 10/27/20 due to skin rash and body aches.    08/17/2020 Breast MRI   IMPRESSION: 1. The biopsy proven malignancy in the left breast and the adjacent satellite lesion have resolved in the interval. No abnormalities in these locations today. 2. No MRI evidence of malignancy in the right breast. 3. No adenopathy identified today   09/20/2020 Surgery   LEFT MODIFIED RADICAL MASTECTOMY by Dr Donne Hazel   09/20/2020 Pathology Results   FINAL MICROSCOPIC DIAGNOSIS:   A. BREAST, LEFT, MODIFIED RADICAL MASTECTOMY:  - No residual  invasive carcinoma in breast status post neoadjuvant  treatment.  - Metastatic carcinoma in (2) of (14) lymph nodes.  - Biopsy sites (one in breast, one in one of the positive lymph nodes).  - See oncology table.   B. ADDITIONAL AXILLARY CONTENTS, LEFT, DISSECTION:  - Metastatic carcinoma in (5) of (5) lymph nodes.    09/20/2020 Cancer Staging   Staging form: Breast, AJCC 8th Edition - Pathologic stage from 09/20/2020: No Stage Recommended (ypT0, pN2a, cM0, G3, ER-, PR-, HER2-) - Signed by Gardenia Phlegm, NP on 10/04/2020  Stage prefix: Post-therapy  Histologic grading system: 3 grade system    10/30/2020 - 12/11/2020 Radiation Therapy   Adjuvant Radiation with Dr Sondra Come starting 10/30/20   10/30/2020 -  Chemotherapy   Adjuvant Xeloda starting 10/30/20 at 1534m BID M-F while on RT.    01/17/2021 -  Chemotherapy    Patient is on Treatment Plan: COLORECTAL 5FU / LEUCOVORIN MODIFIED DEGRAMONT QX41OX 12 CYCLES   Patient is on Antibody Plan: BREAST - TRIPLE NEGATIVE: PEMBROLIZUMAB Q21D        CURRENT THERAPY:  Xeloda, restart 01/01/21  INTERVAL HISTORY:  Caitlyn LEWTERis here for a follow up of breast cancer. She was last seen by me on 12/28/20. She presents to the clinic accompanied by her boyfriend. She reports her rash has reoccurred. She took claritin, femotidine, and singulair. She notes these have made the rash tolerable. She also notes joint pain since  restarting the Xeloda. She notes continued pain to the soles of her feet, specifically the heel, when she walks. Wearing shoes helps the pain.  All other systems were reviewed with the patient and are negative.  MEDICAL HISTORY:  Past Medical History:  Diagnosis Date   Anemia    Anxiety    Arthritis    Cancer ([place])    Left Breast   Depression    GERD (gastroesophageal reflux disease)    Headache    History of radiation therapy 10/30/20-12/11/20   left chest wall and axilla - Dr. Gery Pray   Panic attack      SURGICAL HISTORY: Past Surgical History:  Procedure Laterality Date   MASTECTOMY MODIFIED RADICAL Left 09/20/2020   Procedure: LEFT MODIFIED RADICAL MASTECTOMY;  Surgeon: [name], MD;  Location: [facility];  Service: General;  Laterality: Left;  RNFA; PEC BLOCK;   PORTACATH PLACEMENT Right 03/21/2020   Procedure: INSERTION PORT-A-CATH WITH ULTRASOUND GUIDANCE;  Surgeon: [name], MD;  Location: [facility];  Service: General;  Laterality: Right;    I have reviewed the social history and family history with the patient and they are unchanged from previous note.  ALLERGIES:  is allergic to medroxyprogesterone.  MEDICATIONS:  Current Outpatient Medications  Medication Sig Dispense Refill   ALPRAZolam (XANAX) 1 MG tablet Take 1 mg by mouth 3 (three) times daily as needed for anxiety.      baclofen (LIORESAL) 10 MG tablet Take 1 tablet (10 mg total) by mouth 2 (two) times daily as needed for muscle spasms. (Patient not taking: Reported on 12/19/2020) 30 each 0   capecitabine (XELODA) 500 MG tablet Take 3 tablets (1,500 mg total) by mouth 2 (two) times daily after a meal. Take only on days of radiation Monday through Friday. (Patient not taking: Reported on 12/19/2020) 90 tablet 1   cephALEXin (KEFLEX) 500 MG capsule Take 1 capsule (500 mg total) by mouth 3 (three) times daily. 21 capsule 0   citalopram (CELEXA) 20 MG tablet Take 20 mg by mouth daily.     diphenhydrAMINE (BENADRYL) 25 mg capsule Take 25 mg by mouth every 6 (six) hours as needed for allergies.     diphenhydrAMINE HCl (ZZZQUIL) 50 MG/30ML LIQD Take 30 mLs by mouth at bedtime as needed (sleep).     famotidine (PEPCID) 20 MG tablet TAKE 1 TABLET BY MOUTH TWICE A DAY 60 tablet 1   fluticasone (FLONASE) 50 MCG/ACT nasal spray Place 2 sprays into both nostrils daily as needed for allergies or rhinitis.     gabapentin (NEURONTIN) 100 MG capsule Take 1 capsule (100 mg total) by mouth 3 (three) times daily as  needed (hot flashes). May increase to 336m three times daily as need in a few weeks if tolerates well 90 capsule 1   HYDROcodone-acetaminophen (NORCO/VICODIN) 5-325 MG tablet Take 1 tablet by mouth every 4 (four) hours as needed for moderate pain. Take with food. 30 tablet 0   ibuprofen (ADVIL) 200 MG tablet Take 800 mg by mouth every 8 (eight) hours as needed for moderate pain.     magic mouthwash w/lidocaine SOLN Take 5 mLs by mouth 4 (four) times daily as needed for mouth pain. (Patient not taking: Reported on 12/19/2020) 480 mL 1   montelukast (SINGULAIR) 10 MG tablet TAKE 1 TABLET BY MOUTH EVERYDAY AT BEDTIME 30 tablet 0   traMADol (ULTRAM) 50 MG tablet Take 1 tablet (50 mg total) by mouth daily as needed for severe pain (headache).  30 tablet 0   triamcinolone ointment (KENALOG) 0.5 % Apply 1 application topically 2 (two) times daily. (Patient not taking: Reported on 12/19/2020) 30 g 2   No current facility-administered medications for this visit.   Facility-Administered Medications Ordered in Other Visits  Medication Dose Route Frequency Provider Last Rate Last Admin   sodium chloride flush (NS) 0.9 % injection 10 mL  10 mL Intravenous PRN Alla Feeling, NP   10 mL at 03/28/20 1104    PHYSICAL EXAMINATION: ECOG PERFORMANCE STATUS: 1 - Symptomatic but completely ambulatory  Vitals:   01/11/21 1120  BP: 123/89  Pulse: 93  Resp: 18  Temp: 98.5 F (36.9 C)  SpO2: 100%   Filed Weights   01/11/21 1120  Weight: 218 lb 3.2 oz (99 kg)    Due to COVID19 we will limit examination to appearance. Patient had no complaints.  GENERAL:alert, no distress and comfortable SKIN: skin color normal, no rashes or significant lesions EYES: normal, Conjunctiva are pink and non-injected, sclera clear  NEURO: alert & oriented x 3 with fluent speech  LABORATORY DATA:  I have reviewed the data as listed CBC Latest Ref Rng & Units 01/11/2021 12/07/2020 11/27/2020  WBC 4.0 - 10.5 K/uL 4.9 3.2(L) 6.7   Hemoglobin 12.0 - 15.0 g/dL 12.2 11.2(L) 12.6  Hematocrit 36.0 - 46.0 % 37.4 35.2(L) 38.1  Platelets 150 - 400 K/uL 314 196 206     CMP Latest Ref Rng & Units 01/11/2021 12/07/2020 11/27/2020  Glucose 70 - 99 mg/dL 101(H) 91 73  BUN 6 - 20 mg/dL 15 19 19   Creatinine 0.44 - 1.00 mg/dL 0.77 0.78 0.89  Sodium 135 - 145 mmol/L 140 140 138  Potassium 3.5 - 5.1 mmol/L 4.1 4.2 3.8  Chloride 98 - 111 mmol/L 103 106 104  CO2 22 - 32 mmol/L 28 24 25   Calcium 8.9 - 10.3 mg/dL 9.5 9.4 9.9  Total Protein 6.5 - 8.1 g/dL 7.5 7.0 7.5  Total Bilirubin 0.3 - 1.2 mg/dL 0.6 0.3 0.7  Alkaline Phos 38 - 126 U/L 185(H) 195(H) 172(H)  AST 15 - 41 U/L 25 23 18   ALT 0 - 44 U/L 21 32 29      RADIOGRAPHIC STUDIES: I have personally reviewed the radiological images as listed and agreed with the findings in the report. No results found.   ASSESSMENT & PLAN:  Caitlyn Hendricks is a 40 y.o. female with   1. Skin rashes on front upper chest and head, drug rashes, radiation dermatitis, skin lesions -She had several episodes of rash since she started chemo treatment, she had a skin biopsy which showed a drug-related dermatitis.   -She has residual dermatitis s/p radiation therapy, which she completed 12/11/20. The moist desquamation has resolved. -Since restarting Xeloda on 01/01/21, she again developed a rash across her upper chest, back, and arms. The Claritin, famotidine and Singulair helped to make the rash more tolerable than previously.  -Accordingly, we are going to try a different treatment. If she again experiences a rash, she can consider going back to Xeloda and managing it with her antihistamines.   2. 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 Her 03/08/20 biopsy showed she has grade III invasive ductal carcinoma of  left breast metastatic to her left axillary LN. Her ER/PR/HER2 markers were all negative. CT CAP/Bone scan negative for distant mets.  -To downstage disease and reduce risk of recurrence, she completed neoadjuvant chemo with ddAC q2weeks for 4 cycles 03/22/20-05/03/20. Followed by weekly carbo/taxol for 12 weeks 05/17/20-08/16/20 before proceeding with surgery.  -Based on recently publish Keynote 522 trial data, I added Keytruda q3weeks for 1 year treatment on 04/28/20.   -She underwent left mastectomy with Dr Donne Hazel on 09/20/20. Surgical path shows No residual invasive carcinoma in breast status post neoadjuvant treatment, however has 7/19 positive LN. I discussed with this many positive LNs, she has very high risk of cancer recurrence. -I started her on adjuvant chemotherapy with oral Xeloda for 6 months to reduce her risk of cancer recurrence on 10/30/20. -She unfortunately has had recurrent significant skin rash of upper body. I have held [place] from 10/27/20 and held Xeloda starting 11/20/20. Her skin biopsy from 11/17/20 with Dermatologist shows drug reaction. -She received adjuvant radiation with Dr Sondra Come from 10/30/20 through 12/11/20.  -She restarted Xeloda on 01/01/21 and again developed a rash, as well as joint pain. -We discussed switching to 5FU pump infusion every 2 weeks.  I reviewed the logistics and side effect of 5-FU infusion with her in detail.  We discussed that this is a replacement of Xeloda, but has less skin toxicity.  Although it has not been specifically studied as adjuvant therapy in breast cancer, it is active chemo agent in breast cancer treatment, such as part of the CMF regimen.  Plan to give 5-FU 2400 mg/m over 46 hours every 2 weeks.  If she tolerated well, will add on 5-FU bolus and leucovorin from cycle 2. -Patient agrees to proceed.  The goal of therapy is curative. -She will be due  for annual mammography (right screening) in 02/2021   3. Anxiety/Depression, Social Support -She is on Xanax 49m TID and Celexa 454mand low dose Ambien for sleep.  -She has very good social support from mother and boyfriend. 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 -Mood is stable on Celexa, but she does note increasing issues concentrating.   4. Hot flashes -I discussed chemo will put her in perimenopause, so symptoms of hot flashes can happen. Last full period with start of chemo (03/2020) -She is on Celexa 4022murrently. She is also on Gabapentin 100m72mot flashes improved off chemotherapy but are still present.    5. Genetic Testing negative for pathogenetic mutations with VUS of POLE gene   6. COVID (+) on 08/02/20 with symptoms of Hoarseness and ribcage pain. She has recovered quickly and completely       PLAN:  -discontinue Xeloda -start 5FU pump infusion next week, will skip 5-fu bolus and LV on cycle 1  -f/u with cycle 2      No problem-specific Assessment & Plan notes found for this encounter.   No orders of the defined types were placed in this encounter.  All questions were answered. The patient knows to call the clinic with any problems, questions or concerns. No barriers to learning was detected. The total time spent in the appointment was 40 minutes.     Yan Truitt Merle 01/13/2021   I, KatiWilburn Mylar acting as scribe for Yan Truitt Merle.   I have reviewed the above documentation for accuracy and completeness, and I agree with the above.

## 2021-01-12 ENCOUNTER — Encounter: Payer: Self-pay | Admitting: Hematology

## 2021-01-12 ENCOUNTER — Telehealth: Payer: Self-pay | Admitting: Hematology

## 2021-01-12 NOTE — Telephone Encounter (Signed)
Scheduled follow-up appointments per 7/7 los. Patient is aware. 

## 2021-01-13 ENCOUNTER — Encounter: Payer: Self-pay | Admitting: Hematology

## 2021-01-13 NOTE — Progress Notes (Signed)
DISCONTINUE ON PATHWAY REGIMEN - Breast     A cycle is every 14 days (cycles 1-4):     Doxorubicin      Cyclophosphamide      Pegfilgrastim-xxxx    A cycle is every 21 days (cycles 5-8):     Paclitaxel      Carboplatin   **Always confirm dose/schedule in your pharmacy ordering system**  REASON: Other Reason PRIOR TREATMENT: [id number]: Dose-Dense AC [Doxorubicin + Cyclophosphamide q14 Days x 4 Cycles], Followed by Paclitaxel 80 mg/m2 Weekly + Carboplatin AUC=6 q21 Days x 12 Weeks TREATMENT RESPONSE: Unable to Evaluate  START OFF PATHWAY REGIMEN - Breast   [id number]:Fluorouracil + Leucovorin, 8 week cycle (6 weeks on 2 weeks off):   Administer every 8 weeks:     Leucovorin      Fluorouracil   **Always confirm dose/schedule in your pharmacy ordering system**  Patient Characteristics: Post-Neoadjuvant Therapy and Resection, HER2 Negative/Unknown/Equivocal, Residual Disease, ER Negative/Unknown, Received Neoadjuvant Pembrolizumab + Chemotherapy Therapeutic Status: Post-Neoadjuvant Therapy and Resection Residual Invasive Disease Post-Neoadjuvant Therapy<= Yes ER Status: Negative (-) HER2 Status: Negative (-) PR Status: Negative (-) BRCA Mutation Status: BRCA Wild-Type Intent of Therapy: Curative Intent, Discussed with Patient

## 2021-01-15 ENCOUNTER — Other Ambulatory Visit: Payer: Self-pay | Admitting: Hematology

## 2021-01-18 ENCOUNTER — Inpatient Hospital Stay: Payer: 59

## 2021-01-18 ENCOUNTER — Other Ambulatory Visit: Payer: Self-pay

## 2021-01-18 VITALS — BP 116/77 | HR 91 | Temp 98.3°F | Resp 18 | Wt 217.0 lb

## 2021-01-18 DIAGNOSIS — C50112 Malignant neoplasm of central portion of left female breast: Secondary | ICD-10-CM

## 2021-01-18 DIAGNOSIS — Z5111 Encounter for antineoplastic chemotherapy: Secondary | ICD-10-CM | POA: Diagnosis not present

## 2021-01-18 DIAGNOSIS — Z171 Estrogen receptor negative status [ER-]: Secondary | ICD-10-CM

## 2021-01-18 DIAGNOSIS — Z95828 Presence of other vascular implants and grafts: Secondary | ICD-10-CM

## 2021-01-18 LAB — CMP (CANCER CENTER ONLY)
ALT: 19 U/L (ref 0–44)
AST: 19 U/L (ref 15–41)
Albumin: 3.4 g/dL — ABNORMAL LOW (ref 3.5–5.0)
Alkaline Phosphatase: 196 U/L — ABNORMAL HIGH (ref 38–126)
Anion gap: 8 (ref 5–15)
BUN: 19 mg/dL (ref 6–20)
CO2: 29 mmol/L (ref 22–32)
Calcium: 8.9 mg/dL (ref 8.9–10.3)
Chloride: 102 mmol/L (ref 98–111)
Creatinine: 0.76 mg/dL (ref 0.44–1.00)
GFR, Estimated: >60 mL/min (ref >60)
Glucose, Bld: 105 mg/dL — ABNORMAL HIGH (ref 70–99)
Potassium: 3.7 mmol/L (ref 3.5–5.1)
Sodium: 139 mmol/L (ref 135–145)
Total Bilirubin: 0.7 mg/dL (ref 0.3–1.2)
Total Protein: 7.5 g/dL (ref 6.5–8.1)

## 2021-01-18 LAB — CBC WITH DIFFERENTIAL (CANCER CENTER ONLY)
Abs Immature Granulocytes: 0.03 10*3/uL (ref 0.00–0.07)
Basophils Absolute: 0 10*3/uL (ref 0.0–0.1)
Basophils Relative: 0 %
Eosinophils Absolute: 0 10*3/uL (ref 0.0–0.5)
Eosinophils Relative: 0 %
HCT: 36 % (ref 36.0–46.0)
Hemoglobin: 11.9 g/dL — ABNORMAL LOW (ref 12.0–15.0)
Immature Granulocytes: 0 %
Lymphocytes Relative: 10 %
Lymphs Abs: 0.7 10*3/uL (ref 0.7–4.0)
MCH: 29.8 pg (ref 26.0–34.0)
MCHC: 33.1 g/dL (ref 30.0–36.0)
MCV: 90 fL (ref 80.0–100.0)
Monocytes Absolute: 0.6 10*3/uL (ref 0.1–1.0)
Monocytes Relative: 8 %
Neutro Abs: 5.7 10*3/uL (ref 1.7–7.7)
Neutrophils Relative %: 82 %
Platelet Count: 283 10*3/uL (ref 150–400)
RBC: 4 MIL/uL (ref 3.87–5.11)
RDW: 18.6 % — ABNORMAL HIGH (ref 11.5–15.5)
WBC Count: 7 10*3/uL (ref 4.0–10.5)
nRBC: 0 % (ref 0.0–0.2)

## 2021-01-18 MED ORDER — PROCHLORPERAZINE MALEATE 10 MG PO TABS
ORAL_TABLET | ORAL | Status: AC
  Filled 2021-01-18: qty 1

## 2021-01-18 MED ORDER — PROCHLORPERAZINE MALEATE 10 MG PO TABS
10.0000 mg | ORAL_TABLET | Freq: Once | ORAL | Status: AC
  Administered 2021-01-18: 10 mg via ORAL

## 2021-01-18 MED ORDER — SODIUM CHLORIDE 0.9% FLUSH
10.0000 mL | Freq: Once | INTRAVENOUS | Status: AC
  Administered 2021-01-18: 10 mL
  Filled 2021-01-18: qty 10

## 2021-01-18 MED ORDER — SODIUM CHLORIDE 0.9 % IV SOLN
2380.0000 mg/m2 | INTRAVENOUS | Status: DC
  Administered 2021-01-18: 5000 mg via INTRAVENOUS
  Filled 2021-01-18: qty 100

## 2021-01-18 NOTE — Patient Instructions (Signed)
[facility]  Discharge Instructions: Thank you for choosing [facility] to provide your oncology and hematology care.   If you have a lab appointment with the [facility], please go directly to the [facility] and check in at the registration area.   Wear comfortable clothing and clothing appropriate for easy access to any Portacath or PICC line.   We strive to give you quality time with your provider. You may need to reschedule your appointment if you arrive late (15 or more minutes).  Arriving late affects you and other patients whose appointments are after yours.  Also, if you miss three or more appointments without notifying the office, you may be dismissed from the clinic at the provider's discretion.      For prescription refill requests, have your pharmacy contact our office and allow 72 hours for refills to be completed.    Today you received the following chemotherapy and/or immunotherapy agents fluorouracil      To help prevent nausea and vomiting after your treatment, we encourage you to take your nausea medication as directed.  BELOW ARE SYMPTOMS THAT SHOULD BE REPORTED IMMEDIATELY: *FEVER GREATER THAN 100.4 F (38 C) OR HIGHER *CHILLS OR SWEATING *NAUSEA AND VOMITING THAT IS NOT CONTROLLED WITH YOUR NAUSEA MEDICATION *UNUSUAL SHORTNESS OF BREATH *UNUSUAL BRUISING OR BLEEDING *URINARY PROBLEMS (pain or burning when urinating, or frequent urination) *BOWEL PROBLEMS (unusual diarrhea, constipation, pain near the anus) TENDERNESS IN MOUTH AND THROAT WITH OR WITHOUT PRESENCE OF ULCERS (sore throat, sores in mouth, or a toothache) UNUSUAL RASH, SWELLING OR PAIN  UNUSUAL VAGINAL DISCHARGE OR ITCHING   Items with * indicate a potential emergency and should be followed up as soon as possible or go to the Emergency Department if any problems should occur.  Please show the CHEMOTHERAPY ALERT CARD or IMMUNOTHERAPY ALERT CARD at check-in  to the Emergency Department and triage nurse.  Should you have questions after your visit or need to cancel or reschedule your appointment, please contact [facility]  Dept: [phone]  and follow the prompts.  Office hours are 8:00 a.m. to 4:30 p.m. Monday - Friday. Please note that voicemails left after 4:00 p.m. may not be returned until the following business day.  We are closed weekends and major holidays. You have access to a nurse at all times for urgent questions. Please call the main number to the clinic Dept: [phone] and follow the prompts.   For any non-urgent questions, you may also contact your provider using MyChart. We now offer e-Visits for anyone 60 and older to request care online for non-urgent symptoms. For details visit [email or web address].   Also download the MyChart app! Go to the app store, search "MyChart", open the app, select [facility], and log in with your MyChart username and password.  Due to Covid, a mask is required upon entering the hospital/clinic. If you do not have a mask, one will be given to you upon arrival. For doctor visits, patients may have 1 support person aged 1 or older with them. For treatment visits, patients cannot have anyone with them due to current Covid guidelines and our immunocompromised population.

## 2021-01-20 ENCOUNTER — Other Ambulatory Visit: Payer: Self-pay

## 2021-01-20 ENCOUNTER — Inpatient Hospital Stay: Payer: 59

## 2021-01-20 VITALS — BP 110/80 | HR 112 | Temp 98.6°F | Resp 20

## 2021-01-20 DIAGNOSIS — Z5111 Encounter for antineoplastic chemotherapy: Secondary | ICD-10-CM | POA: Diagnosis not present

## 2021-01-20 DIAGNOSIS — C50112 Malignant neoplasm of central portion of left female breast: Secondary | ICD-10-CM

## 2021-01-20 DIAGNOSIS — Z171 Estrogen receptor negative status [ER-]: Secondary | ICD-10-CM

## 2021-01-20 MED ORDER — SODIUM CHLORIDE 0.9% FLUSH
10.0000 mL | INTRAVENOUS | Status: DC | PRN
Start: 2021-01-20 — End: 2021-01-20
  Administered 2021-01-20: 10 mL
  Filled 2021-01-20: qty 10

## 2021-01-20 MED ORDER — HEPARIN SOD (PORK) LOCK FLUSH 100 UNIT/ML IV SOLN
500.0000 [IU] | Freq: Once | INTRAVENOUS | Status: AC | PRN
  Administered 2021-01-20: 500 [IU]
  Filled 2021-01-20: qty 5

## 2021-01-28 ENCOUNTER — Other Ambulatory Visit: Payer: Self-pay | Admitting: Hematology

## 2021-01-28 NOTE — Progress Notes (Addendum)
[name]   [phone] Fax:([phone]) [phone]   Clinic Follow up Note   Patient Care Team: [name], MD as PCP - General (Family Medicine) Mauro Kaufmann, RN as Oncology Nurse Navigator Rockwell Germany, RN as Oncology Nurse Navigator [name], MD as Consulting Physician (General Surgery) Truitt Merle, MD as Consulting Physician (Hematology) Gery Pray, MD as Consulting Physician (Radiation Oncology) 01/29/2021  CHIEF COMPLAINT: Follow up left breast cancer   SUMMARY OF ONCOLOGIC HISTORY: Oncology History Overview Note  Cancer Staging Cancer of central portion of left female breast [facility]) Staging form: Breast, AJCC 8th Edition - Clinical: No stage assigned - Unsigned    Cancer of central portion of left female breast ([place])  02/23/2020 Mammogram   Diagnostic Mammogram 02/23/20  IMPRESSION The 2x1x2.6cm irregular mass in teh left breast at 12:00 posiiton middle depth is highly suspicious of malignancy. An [place] is recommended for further evaluation and biopsy planning purposes.   02/23/2020 Breast US   [name] 02/23/20  IMPRESSION 2 adjacent spiculated masses in the left brast at 12:00 position 3 cm from the nipple (2.1x0.9x1.1cm and 1.1x1.4x0.5cm) is suggestive of malignancy.   Multiple abnormal left subpectoral and left axillary nodes measuring 3.3x2.1 cm concerning for metastatic adenopathy.    Left breast skin thickening and edema may be secondary congestive edema due to extensive axillary adenopathy.    03/08/2020 Initial Biopsy   Diagnosis 1.Breast, left, needle core biopsy, 12:00 position, 3cmfn -INVASIVE DUCTAL CARCINOMA -SEE COMMENT  2. Lymph node, needle/core biopsy, left axilla -METASTATIC CARCINOMA INVOLVING A LYMPH NODE  -LYMPHOVASCULAR SPACE INVASION PRESENT   Microscopic Comment  1.Based on the biopsy the carcinoma appears Nottingham Grade 3 or 3 and measures 1 cm in the greatest linear extent.    03/08/2020  Receptors her2   ER- Negative 0% PR - Negative 0% HER2 - Negative  KI 67 - 80%    03/08/2020 Cancer Staging   Staging form: Breast, AJCC 8th Edition - Clinical stage from 03/08/2020: Stage IIIB (cT2, cN1, cM0, G3, ER-, PR-, HER2-) - Signed by Truitt Merle, MD on 03/10/2020    03/10/2020 Initial Diagnosis   Cancer of central portion of left female breast ([place])   03/16/2020 Breast MRI   IMPRESSION: 1. 8.1 x 7.8 x 6.6 cm biopsy proven invasive ductal carcinoma in the central right breast, involving 3 quadrants. 2. 3.0 x 1.7 x 1.1 cm satellite mass more inferiorly in lower inner quadrant of the left breast, compatible with additional malignancy. 3. Metastatic level 1 and level 2 left axillary lymph nodes. 4. No evidence of malignancy on the right.   03/17/2020 Imaging   IMPRESSION: CT CAP w contrast  1. Diffuse skin thickening in the left breast with left axillary and subpectoral lymphadenopathy, as well as a mildly enlarged left supraclavicular lymph node, which likely represents metastatic lymphadenopathy. No other definite extra nodal metastatic disease noted elsewhere in the chest, abdomen or pelvis. 2. Large mass in the central anatomic pelvis which is of uncertain origin, potentially a large exophytic fibroid or a large solid mass arising from the right ovary. Further evaluation with pelvic ultrasound is strongly recommended.   03/21/2020 Imaging   Bone Scan  IMPRESSION: Apparent arthropathy at L5. No bony metastatic disease is demonstrable on this study. Scattered foci of abnormal uptake in a pelvic mass is of uncertain etiology given absence of calcification in this mass by CT. This mass compresses the urinary bladder. It is possible that some of  the increased uptake in this area actually represents physiologic uptake within the bladder.   Kidneys noted in flank positions bilaterally.     03/22/2020 - 08/16/2020 Neo-Adjuvant Chemotherapy   Neoadjuvant Adriamycin and Cytoxan  q2weeks for 4 cycles starting 03/22/20-05/03/20 followed by weekly Taxol and Carboplatin for 12 weeks starting 05/17/20-08/16/20   03/24/2020 Imaging   US Pelvis  IMPRESSION: 1. Large pedunculated lesion directly contiguous with the uterine most suggestive of a large subserosal fibroid which measures up to 8.2 cm. 2. Additional 1 cm probable intramural fibroid in the right anterior uterine body. 3. No other acute or worrisome pelvic abnormality.   03/29/2020 Genetic Testing   Negative genetic testing on the common hereditary cancer panel.  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  The Common Hereditary Gene Panel offered by [facility] includes sequencing and/or deletion duplication testing of the following 47 genes: APC, ATM, AXIN2, BARD1, BMPR1A, BRCA1, BRCA2, BRIP1, CDH1, CDK4, CDKN2A (p14ARF), CDKN2A (p16INK4a), CHEK2, CTNNA1, DICER1, EPCAM (Deletion/duplication testing only), GREM1 (promoter region deletion/duplication testing only), KIT, MEN1, MLH1, MSH2, MSH3, MSH6, MUTYH, NBN, NF1, NHTL1, PALB2, PDGFRA, PMS2, POLD1, POLE, PTEN, RAD50, RAD51C, RAD51D, SDHB, SDHC, SDHD, SMAD4, SMARCA4. STK11, TP53, TSC1, TSC2, and VHL.  The following genes were evaluated for sequence changes only: SDHA and HOXB13 c.251G>A variant only. The report date is 03/29/2020   04/28/2020 - 10/27/2020 Antibody Plan   Added Keytruda q3weeks starting 04/28/20 to complete 1 year of treatment. Held since 10/27/20 due to skin rash and body aches.    08/17/2020 Breast MRI   IMPRESSION: 1. The biopsy proven malignancy in the left breast and the adjacent satellite lesion have resolved in the interval. No abnormalities in these locations today. 2. No MRI evidence of malignancy in the right breast. 3. No adenopathy identified today   09/20/2020 Surgery   LEFT MODIFIED RADICAL MASTECTOMY by Dr Donne Hazel   09/20/2020 Pathology Results   FINAL MICROSCOPIC DIAGNOSIS:   A. BREAST, LEFT, MODIFIED RADICAL MASTECTOMY:  - No residual invasive  carcinoma in breast status post neoadjuvant  treatment.  - Metastatic carcinoma in (2) of (14) lymph nodes.  - Biopsy sites (one in breast, one in one of the positive lymph nodes).  - See oncology table.   B. ADDITIONAL AXILLARY CONTENTS, LEFT, DISSECTION:  - Metastatic carcinoma in (5) of (5) lymph nodes.    09/20/2020 Cancer Staging   Staging form: Breast, AJCC 8th Edition - Pathologic stage from 09/20/2020: No Stage Recommended (ypT0, pN2a, cM0, G3, ER-, PR-, HER2-) - Signed by Gardenia Phlegm, NP on 10/04/2020  Stage prefix: Post-therapy  Histologic grading system: 3 grade system    10/30/2020 - 12/11/2020 Radiation Therapy   Adjuvant Radiation with Dr Sondra Come starting 10/30/20   10/30/2020 -  Chemotherapy   Adjuvant Xeloda starting 10/30/20 at 15105m BID M-F while on RT.    01/18/2021 -  Chemotherapy    Patient is on Treatment Plan: COLORECTAL 5FU / LEUCOVORIN MODIFIED [place] X 12 CYCLES   Patient is on Antibody Plan: BREAST - TRIPLE NEGATIVE: PEMBROLIZUMAB Q21D       CURRENT THERAPY: Adjuvant chemo, Xeloda with RT and restarted 01/01/21, changed to 5FU due to side effects (rash, joint pain) starting 01/18/21   INTERVAL HISTORY: Ms. PHidrogoreturns for follow up and treatment as scheduled.  The week between stopping Xeloda and starting 5-FU the rash broke out on her forehead. She was last seen 01/11/21 and consented to 5FU, received first dose on 01/18/21.  She tolerated well  overall, rash is slightly worse, more plaque-like, not very itchy.  She manages with Benadryl, Pepcid, Singulair, and Kenalog.  She does not want to complain much, just get treatment done.  She got her period on 7/23 for the first time in a year.  Denies other changes such as nausea/vomiting, constipation, diarrhea, mucositis, fever, cough, chest pain, dyspnea, or other new concerns.   MEDICAL HISTORY:  Past Medical History:  Diagnosis Date   Anemia    Anxiety    Arthritis    Cancer ([place])    Left  Breast   Depression    GERD (gastroesophageal reflux disease)    Headache    History of radiation therapy 10/30/20-12/11/20   left chest wall and axilla - Dr. Gery Pray   Panic attack     SURGICAL HISTORY: Past Surgical History:  Procedure Laterality Date   MASTECTOMY MODIFIED RADICAL Left 09/20/2020   Procedure: LEFT MODIFIED RADICAL MASTECTOMY;  Surgeon: [name], MD;  Location: [facility];  Service: General;  Laterality: Left;  RNFA; PEC BLOCK;   PORTACATH PLACEMENT Right 03/21/2020   Procedure: INSERTION PORT-A-CATH WITH ULTRASOUND GUIDANCE;  Surgeon: [name], MD;  Location: [facility];  Service: General;  Laterality: Right;    I have reviewed the social history and family history with the patient and they are unchanged from previous note.  ALLERGIES:  is allergic to medroxyprogesterone and xeloda [capecitabine].  MEDICATIONS:  Current Outpatient Medications  Medication Sig Dispense Refill   ALPRAZolam (XANAX) 1 MG tablet Take 1 mg by mouth 3 (three) times daily as needed for anxiety.      citalopram (CELEXA) 20 MG tablet Take 20 mg by mouth daily.     diphenhydrAMINE (BENADRYL) 25 mg capsule Take 25 mg by mouth every 6 (six) hours as needed for allergies.     diphenhydrAMINE HCl (ZZZQUIL) 50 MG/30ML LIQD Take 30 mLs by mouth at bedtime as needed (sleep).     famotidine (PEPCID) 20 MG tablet TAKE 1 TABLET BY MOUTH TWICE A DAY 60 tablet 1   fluticasone (FLONASE) 50 MCG/ACT nasal spray Place 2 sprays into both nostrils daily as needed for allergies or rhinitis.     gabapentin (NEURONTIN) 100 MG capsule Take 1 capsule (100 mg total) by mouth 3 (three) times daily as needed (hot flashes). May increase to 331m three times daily as need in a few weeks if tolerates well 90 capsule 1   montelukast (SINGULAIR) 10 MG tablet TAKE 1 TABLET BY MOUTH EVERYDAY AT BEDTIME 30 tablet 0   triamcinolone ointment (KENALOG) 0.5 % Apply 1 application topically 2 (two)  times daily. 30 g 2   baclofen (LIORESAL) 10 MG tablet Take 1 tablet (10 mg total) by mouth 2 (two) times daily as needed for muscle spasms. 30 each 0   capecitabine (XELODA) 500 MG tablet Take 3 tablets (1,500 mg total) by mouth 2 (two) times daily after a meal. Take only on days of radiation Monday through Friday. (Patient not taking: No sig reported) 90 tablet 1   ibuprofen (ADVIL) 200 MG tablet Take 800 mg by mouth every 8 (eight) hours as needed for moderate pain.     No current facility-administered medications for this visit.   Facility-Administered Medications Ordered in Other Visits  Medication Dose Route Frequency Provider Last Rate Last Admin   sodium chloride flush (NS) 0.9 % injection 10 mL  10 mL Intravenous PRN BAlla Feeling NP   10 mL at 03/28/20 1104  PHYSICAL EXAMINATION: ECOG PERFORMANCE STATUS: 1 - Symptomatic but completely ambulatory  Vitals:   01/29/21 0949  BP: 113/83  Pulse: (!) 101  Resp: 18  Temp: (!) 97.5 F (36.4 C)  SpO2: 99%   Filed Weights   01/29/21 0949  Weight: 218 lb 12.8 oz (99.2 kg)    GENERAL:alert, no distress and comfortable SKIN: Radiation dermatitis to the left chest wall, plaque erythematous drug rash to chest, back, shoulders, and forehead EYES: sclera clear OROPHARYNX: No thrush or ulcers LUNGS: clear with normal breathing effort HEART: regular rate & rhythm, no lower extremity edema NEURO: alert & oriented x 3 with fluent speech, no focal motor/sensory deficits Some rash surrounding PAC site, no edema or drainage       LABORATORY DATA:  I have reviewed the data as listed CBC Latest Ref Rng & Units 01/29/2021 01/18/2021 01/11/2021  WBC 4.0 - 10.5 K/uL 3.5(L) 7.0 4.9  Hemoglobin 12.0 - 15.0 g/dL 12.1 11.9(L) 12.2  Hematocrit 36.0 - 46.0 % 36.5 36.0 37.4  Platelets 150 - 400 K/uL 263 283 314     CMP Latest Ref Rng & Units 01/29/2021 01/18/2021 01/11/2021  Glucose 70 - 99 mg/dL 86 105(H) 101(H)  BUN 6 - 20 mg/dL 19 19 15    Creatinine 0.44 - 1.00 mg/dL 0.82 0.76 0.77  Sodium 135 - 145 mmol/L 138 139 140  Potassium 3.5 - 5.1 mmol/L 4.4 3.7 4.1  Chloride 98 - 111 mmol/L 102 102 103  CO2 22 - 32 mmol/L 27 29 28   Calcium 8.9 - 10.3 mg/dL 9.5 8.9 9.5  Total Protein 6.5 - 8.1 g/dL 7.6 7.5 7.5  Total Bilirubin 0.3 - 1.2 mg/dL 0.3 0.7 0.6  Alkaline Phos 38 - 126 U/L 201(H) 196(H) 185(H)  AST 15 - 41 U/L 19 19 25   ALT 0 - 44 U/L 25 19 21       RADIOGRAPHIC STUDIES: I have personally reviewed the radiological images as listed and agreed with the findings in the report. No results found.   ASSESSMENT & PLAN: Caitlyn Hendricks is a 40 y.o. premenopausal female     1.  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 Her ER/PR/HER2 markers were all negative.  Staging work-up negative for distant metastasis -To downstage her disease and reduce recurrence risk she completed neoadjuvant chemo with ddAC 03/22/2020-05/03/2020 followed by weekly carbo/Taxol for 12 weeks starting 05/17/2020 -08/16/2020.  -Based on keynote 522 trial data, pembrolizumab q 3 weeks was added with neoadjuvant chemo starting 04/28/2020.  Due to drug rash this was not continued postoperatively -She underwent left Mastectomy with Dr. Donne Hazel on 09/20/2020, path showed no residual invasive carcinoma in the breast s/p neoadjuvant treatment but she had 7/19+ lymph nodes, which is associated with very high risk of recurrence -She completed adjuvant radiation with Dr. Randa Ngo from 10/30/2020 - 12/11/2020 -She began adjuvant chemo with oral Xeloda for 6 months to reduce cancer recurrence risk on 10/30/2020 -She developed significant upper  body skin rash.  Keytruda on hold since 10/27/2020 and Xeloda on hold since the end of June/2022. -Skin biopsy from dermatologist showed drug reaction (11/17/2020) -Xeloda was changed to 5-FU on 01/18/2021 -Ms. Printz appears stable. She  completed cycle 1 5FU infusion, skin rash is stable to slightly increased but otherwise tolerated well.  -CBC and CMP adequate, will add 5-FU bolus and leucovorin with cycle 2 02/01/21 -continue rash management with benadryl, singulair, pepcid, and topicals. Will give steroid inj with chemo this week -f/up in 2 weeks with cycle 3 -Patient seen with Dr. [name]    2. Genetic Testing -Given her young age, Her MGM's breast cancer and father prostate cancer, she is eligible for genetic testing. 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 Anxiety/Depression, Social Support -She is currently on Xanax 24m TID and Celexa 56m This is managed by her PCP. -Good support from family and significant other   4.  Hot flashes -Secondary to chemo-induced perimenopause -Managed with gabapentin -Last period 03/2020 prior to starting chemo until she had another period 01/27/2021  PLAN: -Labs reviewed -Proceed with cycle 2 chemo 02/01/21 with 5FU bolus, leucovorin, and 5FU pump -Continue rash regimen -Steroid injection with chemo -F/up with cycle 3 in 2 weeks  All questions were answered. The patient knows to call the clinic with any problems, questions or concerns. No barriers to learning were detected.     LaAlla FeelingNP 01/29/21   Addendum  I have seen the patient, examined her. I agree with the assessment and and plan and have edited the notes.   Safiatou's skin rashes got slightly worse before she started 5-fu, but stable since first cycle 5-fu, and not itchy or painful. Will continue 5-fu every 2 weeks as she tolerates, will add 5-fu bolus and LV from this cycle. Lab reviewed. Pt's rash previously did not  respond well to oral steroids but did well with steroid injection,will give one dose injection with chemo.   YaTruitt Merle7/25/2022

## 2021-01-29 ENCOUNTER — Inpatient Hospital Stay (HOSPITAL_BASED_OUTPATIENT_CLINIC_OR_DEPARTMENT_OTHER): Payer: 59 | Admitting: Nurse Practitioner

## 2021-01-29 ENCOUNTER — Other Ambulatory Visit: Payer: Self-pay

## 2021-01-29 ENCOUNTER — Inpatient Hospital Stay: Payer: 59

## 2021-01-29 ENCOUNTER — Encounter: Payer: Self-pay | Admitting: Nurse Practitioner

## 2021-01-29 VITALS — BP 113/83 | HR 101 | Temp 97.5°F | Resp 18 | Ht 64.0 in | Wt 218.8 lb

## 2021-01-29 DIAGNOSIS — Z95828 Presence of other vascular implants and grafts: Secondary | ICD-10-CM

## 2021-01-29 DIAGNOSIS — C50112 Malignant neoplasm of central portion of left female breast: Secondary | ICD-10-CM

## 2021-01-29 DIAGNOSIS — Z171 Estrogen receptor negative status [ER-]: Secondary | ICD-10-CM | POA: Diagnosis not present

## 2021-01-29 DIAGNOSIS — L298 Other pruritus: Secondary | ICD-10-CM

## 2021-01-29 DIAGNOSIS — Z5111 Encounter for antineoplastic chemotherapy: Secondary | ICD-10-CM | POA: Diagnosis not present

## 2021-01-29 LAB — CMP (CANCER CENTER ONLY)
ALT: 25 U/L (ref 0–44)
AST: 19 U/L (ref 15–41)
Albumin: 3.5 g/dL (ref 3.5–5.0)
Alkaline Phosphatase: 201 U/L — ABNORMAL HIGH (ref 38–126)
Anion gap: 9 (ref 5–15)
BUN: 19 mg/dL (ref 6–20)
CO2: 27 mmol/L (ref 22–32)
Calcium: 9.5 mg/dL (ref 8.9–10.3)
Chloride: 102 mmol/L (ref 98–111)
Creatinine: 0.82 mg/dL (ref 0.44–1.00)
GFR, Estimated: >60 mL/min (ref >60)
Glucose, Bld: 86 mg/dL (ref 70–99)
Potassium: 4.4 mmol/L (ref 3.5–5.1)
Sodium: 138 mmol/L (ref 135–145)
Total Bilirubin: 0.3 mg/dL (ref 0.3–1.2)
Total Protein: 7.6 g/dL (ref 6.5–8.1)

## 2021-01-29 LAB — CBC WITH DIFFERENTIAL (CANCER CENTER ONLY)
Abs Immature Granulocytes: 0.01 10*3/uL (ref 0.00–0.07)
Basophils Absolute: 0 10*3/uL (ref 0.0–0.1)
Basophils Relative: 0 %
Eosinophils Absolute: 0 10*3/uL (ref 0.0–0.5)
Eosinophils Relative: 1 %
HCT: 36.5 % (ref 36.0–46.0)
Hemoglobin: 12.1 g/dL (ref 12.0–15.0)
Immature Granulocytes: 0 %
Lymphocytes Relative: 15 %
Lymphs Abs: 0.5 10*3/uL — ABNORMAL LOW (ref 0.7–4.0)
MCH: 30.3 pg (ref 26.0–34.0)
MCHC: 33.2 g/dL (ref 30.0–36.0)
MCV: 91.5 fL (ref 80.0–100.0)
Monocytes Absolute: 0.5 10*3/uL (ref 0.1–1.0)
Monocytes Relative: 13 %
Neutro Abs: 2.5 10*3/uL (ref 1.7–7.7)
Neutrophils Relative %: 71 %
Platelet Count: 263 10*3/uL (ref 150–400)
RBC: 3.99 MIL/uL (ref 3.87–5.11)
RDW: 17.7 % — ABNORMAL HIGH (ref 11.5–15.5)
WBC Count: 3.5 10*3/uL — ABNORMAL LOW (ref 4.0–10.5)
nRBC: 0 % (ref 0.0–0.2)

## 2021-01-29 MED ORDER — SODIUM CHLORIDE 0.9% FLUSH
10.0000 mL | Freq: Once | INTRAVENOUS | Status: AC
  Administered 2021-01-29: 10 mL
  Filled 2021-01-29: qty 10

## 2021-01-29 MED ORDER — HEPARIN SOD (PORK) LOCK FLUSH 100 UNIT/ML IV SOLN
500.0000 [IU] | Freq: Once | INTRAVENOUS | Status: AC
  Administered 2021-01-29: 500 [IU]
  Filled 2021-01-29: qty 5

## 2021-01-30 ENCOUNTER — Telehealth: Payer: Self-pay | Admitting: Hematology

## 2021-01-30 ENCOUNTER — Other Ambulatory Visit: Payer: Self-pay | Admitting: Nurse Practitioner

## 2021-01-30 NOTE — Telephone Encounter (Signed)
Scheduled follow-up appointments per 7/25 los. Patient is aware. 

## 2021-02-01 ENCOUNTER — Inpatient Hospital Stay: Payer: 59

## 2021-02-01 ENCOUNTER — Other Ambulatory Visit: Payer: Self-pay

## 2021-02-01 VITALS — BP 122/84 | HR 92 | Temp 97.7°F | Resp 17

## 2021-02-01 DIAGNOSIS — Z171 Estrogen receptor negative status [ER-]: Secondary | ICD-10-CM

## 2021-02-01 DIAGNOSIS — C50112 Malignant neoplasm of central portion of left female breast: Secondary | ICD-10-CM

## 2021-02-01 DIAGNOSIS — Z5111 Encounter for antineoplastic chemotherapy: Secondary | ICD-10-CM | POA: Diagnosis not present

## 2021-02-01 MED ORDER — FLUOROURACIL CHEMO INJECTION 2.5 GM/50ML
400.0000 mg/m2 | Freq: Once | INTRAVENOUS | Status: AC
Start: 2021-02-01 — End: 2021-02-01
  Administered 2021-02-01: 850 mg via INTRAVENOUS
  Filled 2021-02-01: qty 17

## 2021-02-01 MED ORDER — SODIUM CHLORIDE 0.9 % IV SOLN
2375.0000 mg/m2 | INTRAVENOUS | Status: DC
  Administered 2021-02-01: 5000 mg via INTRAVENOUS
  Filled 2021-02-01: qty 100

## 2021-02-01 MED ORDER — PROCHLORPERAZINE MALEATE 10 MG PO TABS
ORAL_TABLET | ORAL | Status: AC
  Filled 2021-02-01: qty 1

## 2021-02-01 MED ORDER — SODIUM CHLORIDE 0.9 % IV SOLN
400.0000 mg/m2 | Freq: Once | INTRAVENOUS | Status: AC
  Administered 2021-02-01: 844 mg via INTRAVENOUS
  Filled 2021-02-01: qty 42.2

## 2021-02-01 MED ORDER — PROCHLORPERAZINE MALEATE 10 MG PO TABS
10.0000 mg | ORAL_TABLET | Freq: Once | ORAL | Status: AC
  Administered 2021-02-01: 10 mg via ORAL

## 2021-02-01 MED ORDER — SODIUM CHLORIDE 0.9 % IV SOLN
15.0000 mg | Freq: Once | INTRAVENOUS | Status: AC
  Administered 2021-02-01: 15 mg via INTRAVENOUS
  Filled 2021-02-01: qty 1.5

## 2021-02-01 MED ORDER — DIPHENHYDRAMINE HCL 50 MG/ML IJ SOLN
INTRAMUSCULAR | Status: AC
  Filled 2021-02-01: qty 1

## 2021-02-01 MED ORDER — DIPHENHYDRAMINE HCL 50 MG/ML IJ SOLN
25.0000 mg | Freq: Once | INTRAMUSCULAR | Status: AC
  Administered 2021-02-01: 25 mg via INTRAVENOUS

## 2021-02-01 MED ORDER — SODIUM CHLORIDE 0.9 % IV SOLN
Freq: Once | INTRAVENOUS | Status: AC
  Filled 2021-02-01: qty 250

## 2021-02-03 ENCOUNTER — Other Ambulatory Visit: Payer: Self-pay

## 2021-02-03 ENCOUNTER — Inpatient Hospital Stay: Payer: 59

## 2021-02-03 VITALS — BP 114/74 | HR 87 | Temp 98.2°F | Resp 18 | Ht 64.0 in

## 2021-02-03 DIAGNOSIS — C50112 Malignant neoplasm of central portion of left female breast: Secondary | ICD-10-CM

## 2021-02-03 DIAGNOSIS — Z5111 Encounter for antineoplastic chemotherapy: Secondary | ICD-10-CM | POA: Diagnosis not present

## 2021-02-03 DIAGNOSIS — Z171 Estrogen receptor negative status [ER-]: Secondary | ICD-10-CM

## 2021-02-03 MED ORDER — SODIUM CHLORIDE 0.9% FLUSH
10.0000 mL | INTRAVENOUS | Status: DC | PRN
  Administered 2021-02-03: 10 mL
  Filled 2021-02-03: qty 10

## 2021-02-03 MED ORDER — HEPARIN SOD (PORK) LOCK FLUSH 100 UNIT/ML IV SOLN
500.0000 [IU] | Freq: Once | INTRAVENOUS | Status: AC | PRN
  Administered 2021-02-03: 500 [IU]
  Filled 2021-02-03: qty 5

## 2021-02-09 ENCOUNTER — Other Ambulatory Visit: Payer: Self-pay | Admitting: Hematology

## 2021-02-15 ENCOUNTER — Other Ambulatory Visit: Payer: Self-pay

## 2021-02-15 ENCOUNTER — Inpatient Hospital Stay: Payer: 59

## 2021-02-15 ENCOUNTER — Inpatient Hospital Stay: Payer: 59 | Attending: Hematology

## 2021-02-15 ENCOUNTER — Encounter: Payer: Self-pay | Admitting: Hematology

## 2021-02-15 ENCOUNTER — Inpatient Hospital Stay (HOSPITAL_BASED_OUTPATIENT_CLINIC_OR_DEPARTMENT_OTHER): Payer: 59 | Admitting: Hematology

## 2021-02-15 VITALS — BP 123/81 | HR 92 | Temp 98.0°F | Resp 20 | Ht 64.0 in | Wt 221.6 lb

## 2021-02-15 DIAGNOSIS — Z5111 Encounter for antineoplastic chemotherapy: Secondary | ICD-10-CM | POA: Insufficient documentation

## 2021-02-15 DIAGNOSIS — Z79899 Other long term (current) drug therapy: Secondary | ICD-10-CM | POA: Insufficient documentation

## 2021-02-15 DIAGNOSIS — C50112 Malignant neoplasm of central portion of left female breast: Secondary | ICD-10-CM | POA: Diagnosis not present

## 2021-02-15 DIAGNOSIS — Z923 Personal history of irradiation: Secondary | ICD-10-CM | POA: Diagnosis not present

## 2021-02-15 DIAGNOSIS — D709 Neutropenia, unspecified: Secondary | ICD-10-CM | POA: Insufficient documentation

## 2021-02-15 DIAGNOSIS — Z9221 Personal history of antineoplastic chemotherapy: Secondary | ICD-10-CM | POA: Insufficient documentation

## 2021-02-15 DIAGNOSIS — F329 Major depressive disorder, single episode, unspecified: Secondary | ICD-10-CM | POA: Diagnosis not present

## 2021-02-15 DIAGNOSIS — Z8616 Personal history of COVID-19: Secondary | ICD-10-CM | POA: Insufficient documentation

## 2021-02-15 DIAGNOSIS — Z171 Estrogen receptor negative status [ER-]: Secondary | ICD-10-CM

## 2021-02-15 DIAGNOSIS — Z95828 Presence of other vascular implants and grafts: Secondary | ICD-10-CM

## 2021-02-15 DIAGNOSIS — F419 Anxiety disorder, unspecified: Secondary | ICD-10-CM | POA: Diagnosis not present

## 2021-02-15 DIAGNOSIS — C773 Secondary and unspecified malignant neoplasm of axilla and upper limb lymph nodes: Secondary | ICD-10-CM | POA: Diagnosis not present

## 2021-02-15 DIAGNOSIS — K219 Gastro-esophageal reflux disease without esophagitis: Secondary | ICD-10-CM | POA: Diagnosis not present

## 2021-02-15 DIAGNOSIS — R21 Rash and other nonspecific skin eruption: Secondary | ICD-10-CM | POA: Diagnosis not present

## 2021-02-15 DIAGNOSIS — R232 Flushing: Secondary | ICD-10-CM | POA: Diagnosis not present

## 2021-02-15 DIAGNOSIS — Z170 Estrogen receptor positive status [ER+]: Secondary | ICD-10-CM | POA: Insufficient documentation

## 2021-02-15 LAB — CBC WITH DIFFERENTIAL (CANCER CENTER ONLY)
Abs Immature Granulocytes: 0.01 10*3/uL (ref 0.00–0.07)
Basophils Absolute: 0 10*3/uL (ref 0.0–0.1)
Basophils Relative: 0 %
Eosinophils Absolute: 0 10*3/uL (ref 0.0–0.5)
Eosinophils Relative: 1 %
HCT: 36.4 % (ref 36.0–46.0)
Hemoglobin: 11.9 g/dL — ABNORMAL LOW (ref 12.0–15.0)
Immature Granulocytes: 0 %
Lymphocytes Relative: 20 %
Lymphs Abs: 0.5 10*3/uL — ABNORMAL LOW (ref 0.7–4.0)
MCH: 30.6 pg (ref 26.0–34.0)
MCHC: 32.7 g/dL (ref 30.0–36.0)
MCV: 93.6 fL (ref 80.0–100.0)
Monocytes Absolute: 0.3 10*3/uL (ref 0.1–1.0)
Monocytes Relative: 12 %
Neutro Abs: 1.8 10*3/uL (ref 1.7–7.7)
Neutrophils Relative %: 67 %
Platelet Count: 208 10*3/uL (ref 150–400)
RBC: 3.89 MIL/uL (ref 3.87–5.11)
RDW: 16.7 % — ABNORMAL HIGH (ref 11.5–15.5)
WBC Count: 2.7 10*3/uL — ABNORMAL LOW (ref 4.0–10.5)
nRBC: 0 % (ref 0.0–0.2)

## 2021-02-15 LAB — CMP (CANCER CENTER ONLY)
ALT: 34 U/L (ref 0–44)
AST: 27 U/L (ref 15–41)
Albumin: 3.6 g/dL (ref 3.5–5.0)
Alkaline Phosphatase: 194 U/L — ABNORMAL HIGH (ref 38–126)
Anion gap: 9 (ref 5–15)
BUN: 11 mg/dL (ref 6–20)
CO2: 24 mmol/L (ref 22–32)
Calcium: 9 mg/dL (ref 8.9–10.3)
Chloride: 108 mmol/L (ref 98–111)
Creatinine: 0.81 mg/dL (ref 0.44–1.00)
GFR, Estimated: >60 mL/min (ref >60)
Glucose, Bld: 114 mg/dL — ABNORMAL HIGH (ref 70–99)
Potassium: 3.8 mmol/L (ref 3.5–5.1)
Sodium: 141 mmol/L (ref 135–145)
Total Bilirubin: 0.2 mg/dL — ABNORMAL LOW (ref 0.3–1.2)
Total Protein: 7.3 g/dL (ref 6.5–8.1)

## 2021-02-15 MED ORDER — FLUOROURACIL CHEMO INJECTION 2.5 GM/50ML
400.0000 mg/m2 | Freq: Once | INTRAVENOUS | Status: AC
  Administered 2021-02-15: 850 mg via INTRAVENOUS
  Filled 2021-02-15: qty 17

## 2021-02-15 MED ORDER — SODIUM CHLORIDE 0.9 % IV SOLN
Freq: Once | INTRAVENOUS | Status: AC
  Filled 2021-02-15: qty 250

## 2021-02-15 MED ORDER — LORATADINE 10 MG PO TABS
10.0000 mg | ORAL_TABLET | Freq: Every day | ORAL | Status: DC
  Administered 2021-02-15: 10 mg via ORAL
  Filled 2021-02-15: qty 1

## 2021-02-15 MED ORDER — SODIUM CHLORIDE 0.9 % IV SOLN
15.0000 mg | Freq: Once | INTRAVENOUS | Status: AC
  Administered 2021-02-15: 15 mg via INTRAVENOUS
  Filled 2021-02-15: qty 1.5

## 2021-02-15 MED ORDER — SODIUM CHLORIDE 0.9 % IV SOLN
2375.0000 mg/m2 | INTRAVENOUS | Status: DC
  Administered 2021-02-15: 5000 mg via INTRAVENOUS
  Filled 2021-02-15: qty 100

## 2021-02-15 MED ORDER — SODIUM CHLORIDE 0.9 % IV SOLN
400.0000 mg/m2 | Freq: Once | INTRAVENOUS | Status: AC
  Administered 2021-02-15: 844 mg via INTRAVENOUS
  Filled 2021-02-15: qty 42.2

## 2021-02-15 MED ORDER — SODIUM CHLORIDE 0.9% FLUSH
10.0000 mL | Freq: Once | INTRAVENOUS | Status: AC
  Administered 2021-02-15: 10 mL
  Filled 2021-02-15: qty 10

## 2021-02-15 MED ORDER — PROCHLORPERAZINE MALEATE 10 MG PO TABS
10.0000 mg | ORAL_TABLET | Freq: Once | ORAL | Status: AC
Start: 2021-02-15 — End: 2021-02-15
  Administered 2021-02-15: 10 mg via ORAL
  Filled 2021-02-15: qty 1

## 2021-02-15 NOTE — Progress Notes (Signed)
[place]   [phone] [phone]   Clinic Follow up Note   Patient Care Team: [name], MD as PCP - General (Family Medicine) Mauro Kaufmann, RN as Oncology Nurse Navigator Rockwell Germany, RN as Oncology Nurse Navigator [name], MD as Consulting Physician (General Surgery) Truitt Merle, MD as Consulting Physician (Hematology) Gery Pray, MD as Consulting Physician (Radiation Oncology)  Date of Service:  02/15/2021  CHIEF COMPLAINT: f/u of left breast cancer  SUMMARY OF ONCOLOGIC HISTORY: Oncology History Overview Note  Cancer Staging Cancer of central portion of left female breast [facility]) Staging form: Breast, AJCC 8th Edition - Clinical: No stage assigned - Unsigned    Cancer of central portion of left female breast ([place])  02/23/2020 Mammogram   Diagnostic Mammogram 02/23/20  IMPRESSION The 2x1x2.6cm irregular mass in teh left breast at 12:00 posiiton middle depth is highly suspicious of malignancy. An [place] is recommended for further evaluation and biopsy planning purposes.   02/23/2020 Breast US   [name] 02/23/20  IMPRESSION 2 adjacent spiculated masses in the left brast at 12:00 position 3 cm from the nipple (2.1x0.9x1.1cm and 1.1x1.4x0.5cm) is suggestive of malignancy.   Multiple abnormal left subpectoral and left axillary nodes measuring 3.3x2.1 cm concerning for metastatic adenopathy.    Left breast skin thickening and edema may be secondary congestive edema due to extensive axillary adenopathy.    03/08/2020 Initial Biopsy   Diagnosis 1.Breast, left, needle core biopsy, 12:00 position, 3cmfn -INVASIVE DUCTAL CARCINOMA -SEE COMMENT  2. Lymph node, needle/core biopsy, left axilla -METASTATIC CARCINOMA INVOLVING A LYMPH NODE  -LYMPHOVASCULAR SPACE INVASION PRESENT   Microscopic Comment  1.Based on the biopsy the carcinoma appears Nottingham Grade 3 or 3 and measures 1 cm in the greatest linear extent.     03/08/2020 Receptors her2   ER- Negative 0% PR - Negative 0% HER2 - Negative  KI 67 - 80%    03/08/2020 Cancer Staging   Staging form: Breast, AJCC 8th Edition - Clinical stage from 03/08/2020: Stage IIIB (cT2, cN1, cM0, G3, ER-, PR-, HER2-) - Signed by Truitt Merle, MD on 03/10/2020   03/10/2020 Initial Diagnosis   Cancer of central portion of left female breast ([place])   03/16/2020 Breast MRI   IMPRESSION: 1. 8.1 x 7.8 x 6.6 cm biopsy proven invasive ductal carcinoma in the central right breast, involving 3 quadrants. 2. 3.0 x 1.7 x 1.1 cm satellite mass more inferiorly in lower inner quadrant of the left breast, compatible with additional malignancy. 3. Metastatic level 1 and level 2 left axillary lymph nodes. 4. No evidence of malignancy on the right.   03/17/2020 Imaging   IMPRESSION: CT CAP w contrast  1. Diffuse skin thickening in the left breast with left axillary and subpectoral lymphadenopathy, as well as a mildly enlarged left supraclavicular lymph node, which likely represents metastatic lymphadenopathy. No other definite extra nodal metastatic disease noted elsewhere in the chest, abdomen or pelvis. 2. Large mass in the central anatomic pelvis which is of uncertain origin, potentially a large exophytic fibroid or a large solid mass arising from the right ovary. Further evaluation with pelvic ultrasound is strongly recommended.   03/21/2020 Imaging   Bone Scan  IMPRESSION: Apparent arthropathy at L5. No bony metastatic disease is demonstrable on this study. Scattered foci of abnormal uptake in a pelvic mass is of uncertain etiology given absence of calcification in this mass by CT. This mass compresses the urinary bladder. It is possible  that some of the increased uptake in this area actually represents physiologic uptake within the bladder.   Kidneys noted in flank positions bilaterally.     03/22/2020 - 08/16/2020 Neo-Adjuvant Chemotherapy   Neoadjuvant Adriamycin and  Cytoxan q2weeks for 4 cycles starting 03/22/20-05/03/20 followed by weekly Taxol and Carboplatin for 12 weeks starting 05/17/20-08/16/20   03/24/2020 Imaging   US Pelvis  IMPRESSION: 1. Large pedunculated lesion directly contiguous with the uterine most suggestive of a large subserosal fibroid which measures up to 8.2 cm. 2. Additional 1 cm probable intramural fibroid in the right anterior uterine body. 3. No other acute or worrisome pelvic abnormality.   03/29/2020 Genetic Testing   Negative genetic testing on the common hereditary cancer panel.  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  The Common Hereditary Gene Panel offered by [facility] includes sequencing and/or deletion duplication testing of the following 47 genes: APC, ATM, AXIN2, BARD1, BMPR1A, BRCA1, BRCA2, BRIP1, CDH1, CDK4, CDKN2A (p14ARF), CDKN2A (p16INK4a), CHEK2, CTNNA1, DICER1, EPCAM (Deletion/duplication testing only), GREM1 (promoter region deletion/duplication testing only), KIT, MEN1, MLH1, MSH2, MSH3, MSH6, MUTYH, NBN, NF1, NHTL1, PALB2, PDGFRA, PMS2, POLD1, POLE, PTEN, RAD50, RAD51C, RAD51D, SDHB, SDHC, SDHD, SMAD4, SMARCA4. STK11, TP53, TSC1, TSC2, and VHL.  The following genes were evaluated for sequence changes only: SDHA and HOXB13 c.251G>A variant only. The report date is 03/29/2020   04/28/2020 - 10/27/2020 Antibody Plan   Added Keytruda q3weeks starting 04/28/20 to complete 1 year of treatment. Held since 10/27/20 due to skin rash and body aches.    08/17/2020 Breast MRI   IMPRESSION: 1. The biopsy proven malignancy in the left breast and the adjacent satellite lesion have resolved in the interval. No abnormalities in these locations today. 2. No MRI evidence of malignancy in the right breast. 3. No adenopathy identified today   09/20/2020 Surgery   LEFT MODIFIED RADICAL MASTECTOMY by Dr Donne Hazel   09/20/2020 Pathology Results   FINAL MICROSCOPIC DIAGNOSIS:   A. BREAST, LEFT, MODIFIED RADICAL MASTECTOMY:  - No residual  invasive carcinoma in breast status post neoadjuvant  treatment.  - Metastatic carcinoma in (2) of (14) lymph nodes.  - Biopsy sites (one in breast, one in one of the positive lymph nodes).  - See oncology table.   B. ADDITIONAL AXILLARY CONTENTS, LEFT, DISSECTION:  - Metastatic carcinoma in (5) of (5) lymph nodes.    09/20/2020 Cancer Staging   Staging form: Breast, AJCC 8th Edition - Pathologic stage from 09/20/2020: No Stage Recommended (ypT0, pN2a, cM0, G3, ER-, PR-, HER2-) - Signed by Gardenia Phlegm, NP on 10/04/2020 Stage prefix: Post-therapy Histologic grading system: 3 grade system   10/30/2020 - 12/11/2020 Radiation Therapy   Adjuvant Radiation with Dr Sondra Come starting 10/30/20   10/30/2020 -  Chemotherapy   Adjuvant Xeloda starting 10/30/20 at $RemoveBe'1500mg'hBbpBbmcT$  BID M-F while on RT. Held since 11/20/2020 due to recurrent skin rash   01/18/2021 -  Chemotherapy    Patient is on Treatment Plan: COLORECTAL 5FU / LEUCOVORIN MODIFIED DEGRAMONT Q14D X 12 CYCLES   Patient is on Antibody Plan: BREAST - TRIPLE NEGATIVE: PEMBROLIZUMAB Q21D        CURRENT THERAPY:  5FU pump infusion every 2 weeks starting 01/18/21  INTERVAL HISTORY:  Caitlyn Hendricks is here for a follow up of breast cancer. She was last seen by me on 01/13/21 and by NP Lacie in the interim. She presents to the clinic alone. She has again developed a rash. This is present on her chest, upper back,  and shoulders. There is peeling present on her left shoulder. She feels it is getting better. She reports the rash seemed to flare up 3 days after her infusion, when the steroid wore off, but has since dried out and began peeling. She notes some throat discomfort ("not like sick sore") and denies mouth sores. She notes continued anxiety with recurrent panic attacks. She reports a history of anxiety prior to diagnosis and treatment. She notes she has talked to so many people but feels this does not relieve her anxiety. She feels very limited  due to the itching/rash and being unable to go outside, frustration due to not being on the chemo she feels she should be on, upset since the loss of an online friend (from metastatic triple negative breast cancer)  All other systems were reviewed with the patient and are negative.  MEDICAL HISTORY:  Past Medical History:  Diagnosis Date   Anemia    Anxiety    Arthritis    Cancer ([place])    Left Breast   Depression    GERD (gastroesophageal reflux disease)    Headache    History of radiation therapy 10/30/20-12/11/20   left chest wall and axilla - Dr. Gery Pray   Panic attack     SURGICAL HISTORY: Past Surgical History:  Procedure Laterality Date   MASTECTOMY MODIFIED RADICAL Left 09/20/2020   Procedure: LEFT MODIFIED RADICAL MASTECTOMY;  Surgeon: [name], MD;  Location: [facility];  Service: General;  Laterality: Left;  RNFA; PEC BLOCK;   PORTACATH PLACEMENT Right 03/21/2020   Procedure: INSERTION PORT-A-CATH WITH ULTRASOUND GUIDANCE;  Surgeon: [name], MD;  Location: [facility];  Service: General;  Laterality: Right;    I have reviewed the social history and family history with the patient and they are unchanged from previous note.  ALLERGIES:  is allergic to medroxyprogesterone and xeloda [capecitabine].  MEDICATIONS:  Current Outpatient Medications  Medication Sig Dispense Refill   ALPRAZolam (XANAX) 1 MG tablet Take 1 mg by mouth 3 (three) times daily as needed for anxiety.      baclofen (LIORESAL) 10 MG tablet Take 1 tablet (10 mg total) by mouth 2 (two) times daily as needed for muscle spasms. 30 each 0   citalopram (CELEXA) 20 MG tablet Take 20 mg by mouth daily.     diphenhydrAMINE (BENADRYL) 25 mg capsule Take 25 mg by mouth every 6 (six) hours as needed for allergies.     diphenhydrAMINE HCl (ZZZQUIL) 50 MG/30ML LIQD Take 30 mLs by mouth at bedtime as needed (sleep).     famotidine (PEPCID) 20 MG tablet TAKE 1 TABLET BY MOUTH TWICE A  DAY 60 tablet 1   fluticasone (FLONASE) 50 MCG/ACT nasal spray Place 2 sprays into both nostrils daily as needed for allergies or rhinitis.     gabapentin (NEURONTIN) 100 MG capsule Take 1 capsule (100 mg total) by mouth 3 (three) times daily as needed (hot flashes). May increase to $RemoveBef'300mg'ckyfLaryIY$  three times daily as need in a few weeks if tolerates well 90 capsule 1   ibuprofen (ADVIL) 200 MG tablet Take 800 mg by mouth every 8 (eight) hours as needed for moderate pain.     montelukast (SINGULAIR) 10 MG tablet TAKE 1 TABLET BY MOUTH EVERYDAY AT BEDTIME 30 tablet 0   triamcinolone ointment (KENALOG) 0.5 % Apply 1 application topically 2 (two) times daily. 30 g 2   No current facility-administered medications for this visit.   Facility-Administered Medications Ordered in Other  Visits  Medication Dose Route Frequency Provider Last Rate Last Admin   fluorouracil (ADRUCIL) 5,000 mg in sodium chloride 0.9 % 150 mL chemo infusion  2,375 mg/m2 (Treatment Plan Recorded) Intravenous 1 day or 1 dose Truitt Merle, MD   5,000 mg at 02/15/21 1649   loratadine (CLARITIN) tablet 10 mg  10 mg Oral Daily Truitt Merle, MD   10 mg at 02/15/21 1509   sodium chloride flush (NS) 0.9 % injection 10 mL  10 mL Intravenous PRN Alla Feeling, NP   10 mL at 03/28/20 1104    PHYSICAL EXAMINATION: ECOG PERFORMANCE STATUS: 1 - Symptomatic but completely ambulatory  Vitals:   02/15/21 1432  BP: 123/81  Pulse: 92  Resp: 20  Temp: 98 F (36.7 C)  SpO2: 99%   Wt Readings from Last 3 Encounters:  02/15/21 221 lb 9.6 oz (100.5 kg)  01/29/21 218 lb 12.8 oz (99.2 kg)  01/18/21 217 lb (98.4 kg)     GENERAL:alert, no distress and comfortable SKIN: skin color, texture, turgor are normal, no significant lesions, (+) rash along chest and tops of shoulders EYES: normal, Conjunctiva are pink and non-injected, sclera clear  NEURO: alert & oriented x 3 with fluent speech, no focal motor/sensory deficits  LABORATORY DATA:  I have  reviewed the data as listed CBC Latest Ref Rng & Units 02/15/2021 01/29/2021 01/18/2021  WBC 4.0 - 10.5 K/uL 2.7(L) 3.5(L) 7.0  Hemoglobin 12.0 - 15.0 g/dL 11.9(L) 12.1 11.9(L)  Hematocrit 36.0 - 46.0 % 36.4 36.5 36.0  Platelets 150 - 400 K/uL 208 263 283     CMP Latest Ref Rng & Units 02/15/2021 01/29/2021 01/18/2021  Glucose 70 - 99 mg/dL 114(H) 86 105(H)  BUN 6 - 20 mg/dL $Remove'11 19 19  'SAIZvow$ Creatinine 0.44 - 1.00 mg/dL 0.81 0.82 0.76  Sodium 135 - 145 mmol/L 141 138 139  Potassium 3.5 - 5.1 mmol/L 3.8 4.4 3.7  Chloride 98 - 111 mmol/L 108 102 102  CO2 22 - 32 mmol/L $RemoveB'24 27 29  'kiRcJDII$ Calcium 8.9 - 10.3 mg/dL 9.0 9.5 8.9  Total Protein 6.5 - 8.1 g/dL 7.3 7.6 7.5  Total Bilirubin 0.3 - 1.2 mg/dL 0.2(L) 0.3 0.7  Alkaline Phos 38 - 126 U/L 194(H) 201(H) 196(H)  AST 15 - 41 U/L $Remo'27 19 19  'XhXtS$ ALT 0 - 44 U/L 34 25 19      RADIOGRAPHIC STUDIES: I have personally reviewed the radiological images as listed and agreed with the findings in the report. No results found.   ASSESSMENT & PLAN:  HULDAH MARIN is a 40 y.o. female with   1. Skin rashes on front upper chest and head, drug rashes, radiation dermatitis, skin lesions -She had several episodes of rash since she started chemo treatment, she had a skin biopsy which showed a drug-related dermatitis.   -She has residual dermatitis s/p radiation therapy, which she completed 12/11/20. The moist desquamation has resolved. -Since restarting Xeloda on 01/01/21, she again developed a rash across her upper chest, back, and arms. The Claritin, famotidine and Singulair helped to make the rash more tolerable than previously.  -She was switched to 5FU on 01/18/21. She has a rash on her chest and left shoulder that is now peeling.   2. 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 Her 03/08/20 biopsy showed she has grade III invasive ductal carcinoma of left breast metastatic to her left axillary LN. Her ER/PR/HER2 markers were all negative. CT CAP/Bone scan negative for distant mets.  -To downstage disease and reduce risk of recurrence, she completed neoadjuvant chemo with ddAC q2weeks for 4 cycles 03/22/20-05/03/20. Followed by weekly carbo/taxol for 12 weeks 05/17/20-08/16/20 before proceeding with surgery.  -Based on recently publish Keynote 522 trial data, I added Keytruda q3weeks for 1 year treatment on 04/28/20.   -She underwent left mastectomy with Dr Donne Hazel on 09/20/20. Surgical path shows No residual invasive carcinoma in breast status post neoadjuvant treatment, however has 7/19 positive LN. I discussed with this many positive LNs, she has very high risk of cancer recurrence. -I started her on adjuvant chemotherapy with oral Xeloda for 6 months to reduce her risk of cancer recurrence on 10/30/20. -She unfortunately has had recurrent significant skin rash of upper body. I held [place] from 10/27/20 and held Xeloda starting 11/20/20. Her skin biopsy from 11/17/20 with Dermatologist shows drug reaction. -She received adjuvant radiation with Dr Sondra Come from 10/30/20 through 12/11/20.  -She restarted Xeloda on 01/01/21 and again developed a rash, as well as joint pain. -We switched her treatment to 5-FU on 01/18/21 and added 5-FU bolus and leucovorin with cycle 2. She again developed a rash, but this has dried up and is peeling today. -She feels that her skin rash is more manageable on 5-fu than Xeloda -She is otherwise tolerating 5-FU well, will continue every 2 weeks, until the end of December for total of 6 months of Xeloda and 5-fu  -Due to her significant skin rash and requires high-dose steroids, I do not plan to restart Keytruda.   3. Worsening anxiety  -She has a history of anxiety  and has been on Xanax $Remove'1mg'AIkQSKC$  TID and Celexa $RemoveBe'40mg'DNYRcCYoe$   -she is on low dose Ambien for sleep.  -She has very good social support from mother and boyfriend. 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 -She has developed worsening anxiety lately, due to stress from cancer diagnosis, skin rash, not able to go out due to rash etc. She was tearful today -She feels that she has had adequate counseling, but still cannot control her anxiety -I will refer her to behavioral health for medical treatment of her anxiety    4. Hot flashes -Secondary to chemo-induced perimenopause -Managed with gabapentin and celexa -Last period 03/2020 prior to starting chemo until she had another period 01/27/2021   5. Genetic Testing negative for pathogenetic mutations with VUS of POLE gene   6. COVID (+) on 08/02/20 with symptoms of Hoarseness and ribcage pain. She has recovered quickly and completely      PLAN: -Proceed with cycle 3 chemo with 5FU bolus, leucovorin, and 5FU pump infusion  -Continue topical creams for rash -dexa $RemoveBe'15mg'juhSYmYDk$  injection with chemo -F/up with cycle 4 in 2 weeks    No problem-specific Assessment & Plan notes found for this encounter.   No orders of the defined types were placed in this encounter.  All questions were answered. The patient knows to call the clinic with any problems, questions or concerns. No barriers to learning was detected. The total time spent in the appointment was 30 minutes.     Truitt Merle, MD 02/15/2021   I, Wilburn Mylar, am acting as scribe for Truitt Merle, MD.   I have  reviewed the above documentation for accuracy and completeness, and I agree with the above.

## 2021-02-15 NOTE — Patient Instructions (Signed)
[facility]  Discharge Instructions: Thank you for choosing [facility] to provide your oncology and hematology care.   If you have a lab appointment with the [facility], please go directly to the [facility] and check in at the registration area.   Wear comfortable clothing and clothing appropriate for easy access to any Portacath or PICC line.   We strive to give you quality time with your provider. You may need to reschedule your appointment if you arrive late (15 or more minutes).  Arriving late affects you and other patients whose appointments are after yours.  Also, if you miss three or more appointments without notifying the office, you may be dismissed from the clinic at the provider's discretion.      For prescription refill requests, have your pharmacy contact our office and allow 72 hours for refills to be completed.    Today you received the following chemotherapy and/or immunotherapy agents fluorouracil; leucovorin   To help prevent nausea and vomiting after your treatment, we encourage you to take your nausea medication as directed.  BELOW ARE SYMPTOMS THAT SHOULD BE REPORTED IMMEDIATELY: *FEVER GREATER THAN 100.4 F (38 C) OR HIGHER *CHILLS OR SWEATING *NAUSEA AND VOMITING THAT IS NOT CONTROLLED WITH YOUR NAUSEA MEDICATION *UNUSUAL SHORTNESS OF BREATH *UNUSUAL BRUISING OR BLEEDING *URINARY PROBLEMS (pain or burning when urinating, or frequent urination) *BOWEL PROBLEMS (unusual diarrhea, constipation, pain near the anus) TENDERNESS IN MOUTH AND THROAT WITH OR WITHOUT PRESENCE OF ULCERS (sore throat, sores in mouth, or a toothache) UNUSUAL RASH, SWELLING OR PAIN  UNUSUAL VAGINAL DISCHARGE OR ITCHING   Items with * indicate a potential emergency and should be followed up as soon as possible or go to the Emergency Department if any problems should occur.  Please show the CHEMOTHERAPY ALERT CARD or IMMUNOTHERAPY ALERT CARD at  check-in to the Emergency Department and triage nurse.  Should you have questions after your visit or need to cancel or reschedule your appointment, please contact [facility]  Dept: [phone]  and follow the prompts.  Office hours are 8:00 a.m. to 4:30 p.m. Monday - Friday. Please note that voicemails left after 4:00 p.m. may not be returned until the following business day.  We are closed weekends and major holidays. You have access to a nurse at all times for urgent questions. Please call the main number to the clinic Dept: [phone] and follow the prompts.   For any non-urgent questions, you may also contact your provider using MyChart. We now offer e-Visits for anyone 58 and older to request care online for non-urgent symptoms. For details visit [email or web address].   Also download the MyChart app! Go to the app store, search "MyChart", open the app, select [facility], and log in with your MyChart username and password.  Due to Covid, a mask is required upon entering the hospital/clinic. If you do not have a mask, one will be given to you upon arrival. For doctor visits, patients may have 1 support person aged 88 or older with them. For treatment visits, patients cannot have anyone with them due to current Covid guidelines and our immunocompromised population.

## 2021-02-15 NOTE — Progress Notes (Signed)
Per Dr. [name] 5-FU pump rate can be increased to 5.5 mls/hr so patient will be done by 2pm on Saturday.

## 2021-02-16 ENCOUNTER — Telehealth: Payer: Self-pay | Admitting: Hematology

## 2021-02-16 NOTE — Telephone Encounter (Signed)
Scheduled appts per 8/11 los. Will have updated calendar printed for pt at next visit.  

## 2021-02-17 ENCOUNTER — Other Ambulatory Visit: Payer: Self-pay

## 2021-02-17 ENCOUNTER — Inpatient Hospital Stay: Payer: 59

## 2021-02-17 VITALS — BP 116/90 | HR 90 | Temp 97.8°F | Resp 18

## 2021-02-17 DIAGNOSIS — C50112 Malignant neoplasm of central portion of left female breast: Secondary | ICD-10-CM

## 2021-02-17 DIAGNOSIS — Z5111 Encounter for antineoplastic chemotherapy: Secondary | ICD-10-CM | POA: Diagnosis not present

## 2021-02-17 MED ORDER — SODIUM CHLORIDE 0.9% FLUSH
10.0000 mL | INTRAVENOUS | Status: DC | PRN
  Administered 2021-02-17: 10 mL
  Filled 2021-02-17: qty 10

## 2021-02-17 MED ORDER — HEPARIN SOD (PORK) LOCK FLUSH 100 UNIT/ML IV SOLN
500.0000 [IU] | Freq: Once | INTRAVENOUS | Status: AC | PRN
  Administered 2021-02-17: 500 [IU]
  Filled 2021-02-17: qty 5

## 2021-03-01 ENCOUNTER — Encounter: Payer: Self-pay | Admitting: Hematology

## 2021-03-01 ENCOUNTER — Inpatient Hospital Stay: Payer: 59

## 2021-03-01 ENCOUNTER — Inpatient Hospital Stay (HOSPITAL_BASED_OUTPATIENT_CLINIC_OR_DEPARTMENT_OTHER): Payer: 59 | Admitting: Hematology

## 2021-03-01 ENCOUNTER — Telehealth: Payer: Self-pay

## 2021-03-01 ENCOUNTER — Inpatient Hospital Stay: Payer: 59 | Admitting: General Practice

## 2021-03-01 ENCOUNTER — Other Ambulatory Visit: Payer: Self-pay

## 2021-03-01 VITALS — BP 122/79 | HR 92 | Temp 98.0°F | Resp 18 | Ht 64.0 in | Wt 228.3 lb

## 2021-03-01 DIAGNOSIS — Z171 Estrogen receptor negative status [ER-]: Secondary | ICD-10-CM

## 2021-03-01 DIAGNOSIS — C50112 Malignant neoplasm of central portion of left female breast: Secondary | ICD-10-CM

## 2021-03-01 DIAGNOSIS — Z5111 Encounter for antineoplastic chemotherapy: Secondary | ICD-10-CM | POA: Diagnosis not present

## 2021-03-01 DIAGNOSIS — Z95828 Presence of other vascular implants and grafts: Secondary | ICD-10-CM

## 2021-03-01 LAB — CMP (CANCER CENTER ONLY)
ALT: 39 U/L (ref 0–44)
AST: 30 U/L (ref 15–41)
Albumin: 3.3 g/dL — ABNORMAL LOW (ref 3.5–5.0)
Alkaline Phosphatase: 211 U/L — ABNORMAL HIGH (ref 38–126)
Anion gap: 9 (ref 5–15)
BUN: 16 mg/dL (ref 6–20)
CO2: 24 mmol/L (ref 22–32)
Calcium: 8.6 mg/dL — ABNORMAL LOW (ref 8.9–10.3)
Chloride: 105 mmol/L (ref 98–111)
Creatinine: 0.93 mg/dL (ref 0.44–1.00)
GFR, Estimated: >60 mL/min (ref >60)
Glucose, Bld: 103 mg/dL — ABNORMAL HIGH (ref 70–99)
Potassium: 3.7 mmol/L (ref 3.5–5.1)
Sodium: 138 mmol/L (ref 135–145)
Total Bilirubin: 0.5 mg/dL (ref 0.3–1.2)
Total Protein: 7 g/dL (ref 6.5–8.1)

## 2021-03-01 LAB — CBC WITH DIFFERENTIAL (CANCER CENTER ONLY)
Abs Immature Granulocytes: 0.01 10*3/uL (ref 0.00–0.07)
Basophils Absolute: 0 10*3/uL (ref 0.0–0.1)
Basophils Relative: 1 %
Eosinophils Absolute: 0 10*3/uL (ref 0.0–0.5)
Eosinophils Relative: 2 %
HCT: 33.2 % — ABNORMAL LOW (ref 36.0–46.0)
Hemoglobin: 10.8 g/dL — ABNORMAL LOW (ref 12.0–15.0)
Immature Granulocytes: 1 %
Lymphocytes Relative: 45 %
Lymphs Abs: 0.5 10*3/uL — ABNORMAL LOW (ref 0.7–4.0)
MCH: 30.9 pg (ref 26.0–34.0)
MCHC: 32.5 g/dL (ref 30.0–36.0)
MCV: 94.9 fL (ref 80.0–100.0)
Monocytes Absolute: 0.1 10*3/uL (ref 0.1–1.0)
Monocytes Relative: 14 %
Neutro Abs: 0.4 10*3/uL — CL (ref 1.7–7.7)
Neutrophils Relative %: 37 %
Platelet Count: 142 10*3/uL — ABNORMAL LOW (ref 150–400)
RBC: 3.5 MIL/uL — ABNORMAL LOW (ref 3.87–5.11)
RDW: 16.4 % — ABNORMAL HIGH (ref 11.5–15.5)
WBC Count: 1 10*3/uL — ABNORMAL LOW (ref 4.0–10.5)
nRBC: 2.9 % — ABNORMAL HIGH (ref 0.0–0.2)

## 2021-03-01 MED ORDER — SODIUM CHLORIDE 0.9% FLUSH
10.0000 mL | Freq: Once | INTRAVENOUS | Status: AC
  Administered 2021-03-01: 10 mL

## 2021-03-01 MED ORDER — HEPARIN SOD (PORK) LOCK FLUSH 100 UNIT/ML IV SOLN
500.0000 [IU] | Freq: Once | INTRAVENOUS | Status: AC
  Administered 2021-03-01: 500 [IU]

## 2021-03-01 NOTE — Progress Notes (Signed)
Wbc 1, anc 0.4, Dr. [name] made aware, chemo tx to be held this week and r/s'ed for next week. Patient made aware, port to be flushed and deaccessed.

## 2021-03-01 NOTE — Telephone Encounter (Signed)
CRITICAL VALUE STICKER  CRITICAL VALUE: ANC 0.4   RECEIVER (on-site recipient of call): Nylah Butkus P. LPN  DATE & TIME NOTIFIED: 03/01/21 2:07 pm  MESSENGER (representative from lab): Ulice Dash  MD NOTIFIED: Dr. [name]  TIME OF NOTIFICATION:2:09 pm

## 2021-03-01 NOTE — Progress Notes (Signed)
[place]   [phone] [phone]   Clinic Follow up Note   Patient Care Team: [name], MD as PCP - General (Family Medicine) Mauro Kaufmann, RN as Oncology Nurse Navigator Rockwell Germany, RN as Oncology Nurse Navigator [name], MD as Consulting Physician (General Surgery) Truitt Merle, MD as Consulting Physician (Hematology) Gery Pray, MD as Consulting Physician (Radiation Oncology)  Date of Service:  03/01/2021  CHIEF COMPLAINT: f/u of left breast cancer  ASSESSMENT & PLAN:  Caitlyn Hendricks is a 40 y.o. female with   1. 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   -She has residual dermatitis s/p radiation therapy, which she completed 12/11/20. The moist desquamation has resolved. -Since restarting Xeloda on 01/01/21, she again developed a rash across her upper chest, back, and arms. The Claritin, famotidine and Singulair helped to make the rash more tolerable than previously.  -She was switched to 5FU on 01/18/21. The rash on her chest and left shoulder are overall moderate and stable  -she f/u with dermatology and gets kenalog injection there    2. 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 Her 03/08/20 biopsy showed she has grade III invasive ductal carcinoma of left breast metastatic to her left axillary LN. Her ER/PR/HER2 markers were all negative. CT CAP/Bone scan negative for distant mets.  -To downstage disease and reduce risk of recurrence, she completed neoadjuvant chemo with ddAC q2weeks for 4  cycles 03/22/20-05/03/20. Followed by weekly carbo/taxol for 12 weeks 05/17/20-08/16/20 before proceeding with surgery.  -she started Ballard Russell for 1 year treatment on 04/28/20, but stopped in 10/2020 due to skin rash and arthralgia.   -She underwent left mastectomy with Dr Donne Hazel on 09/20/20. Surgical path shows No residual invasive carcinoma in breast status post neoadjuvant treatment, however has 7/19 positive LN. I discussed with this many positive LNs, she has very high risk of cancer recurrence. -I started her on adjuvant chemotherapy with oral Xeloda for 6 months to reduce her risk of cancer recurrence on 10/30/20 but held starting 11/20/20 due to skin rash. Her skin biopsy from 11/17/20 with Dermatologist shows drug reaction. -She received adjuvant radiation with Dr Sondra Come from 10/30/20 through 12/11/20.  -She restarted Xeloda on 01/01/21 and again developed a rash, as well as joint pain. -We switched her treatment to 5-FU on 01/18/21 and added 5-FU bolus and leucovorin with cycle 2. She again developed a rash, but overall milder  -She feels that her skin rash is more manageable on 5-fu than Xeloda -Lab reviewed, she has significant neutropenia with ANC 0.7 today, will postpone chemo for 1 week -Due to her mucositis and post 5-FU infusion right-sided neck pain, I will cancel her 5-FU bolus   3. Worsening anxiety  -She has a history of anxiety and has been on Xanax 63m TID and Celexa 467m -she is on low dose Ambien for sleep.  -She has very good social support from mother and boyfriend. 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 -She has developed worsening anxiety lately, due to  stress from cancer diagnosis, skin rash, not able to go out due to rash etc.  -She feels that she has had adequate counseling, and that talking about it makes it worse. -We provided her with information about the [place] in [facility]. She will have to reach out to them  herself, and she is aware.   4. Hot flashes -Secondary to chemo-induced perimenopause -Managed with gabapentin and celexa -Last period 03/2020 prior to starting chemo until she had another period 01/27/2021   5. Genetic Testing negative for pathogenetic mutations with VUS of POLE gene   6. COVID (+) on 08/02/20 with symptoms of Hoarseness and ribcage pain. She has recovered quickly and completely       PLAN: -Due to neutropenia, will postpone cycle 4 chemo for a week, cancel 5-fu bolus and premeds dexa  -Continue topical creams for rash -F/up with cycle 5 in 3 weeks   No problem-specific Assessment & Plan notes found for this encounter.   SUMMARY OF ONCOLOGIC HISTORY: Oncology History Overview Note  Cancer Staging Cancer of central portion of left female breast [facility]) Staging form: Breast, AJCC 8th Edition - Clinical: No stage assigned - Unsigned    Cancer of central portion of left female breast ([place])  02/23/2020 Mammogram   Diagnostic Mammogram 02/23/20  IMPRESSION The 2x1x2.6cm irregular mass in teh left breast at 12:00 posiiton middle depth is highly suspicious of malignancy. An [place] is recommended for further evaluation and biopsy planning purposes.   02/23/2020 Breast US   [name] 02/23/20  IMPRESSION 2 adjacent spiculated masses in the left brast at 12:00 position 3 cm from the nipple (2.1x0.9x1.1cm and 1.1x1.4x0.5cm) is suggestive of malignancy.   Multiple abnormal left subpectoral and left axillary nodes measuring 3.3x2.1 cm concerning for metastatic adenopathy.    Left breast skin thickening and edema may be secondary congestive edema due to extensive axillary adenopathy.    03/08/2020 Initial Biopsy   Diagnosis 1.Breast, left, needle core biopsy, 12:00 position, 3cmfn -INVASIVE DUCTAL CARCINOMA -SEE COMMENT  2. Lymph node, needle/core biopsy, left axilla -METASTATIC CARCINOMA INVOLVING A LYMPH NODE  -LYMPHOVASCULAR SPACE INVASION PRESENT   Microscopic  Comment  1.Based on the biopsy the carcinoma appears Nottingham Grade 3 or 3 and measures 1 cm in the greatest linear extent.    03/08/2020 Receptors her2   ER- Negative 0% PR - Negative 0% HER2 - Negative  KI 67 - 80%    03/08/2020 Cancer Staging   Staging form: Breast, AJCC 8th Edition - Clinical stage from 03/08/2020: Stage IIIB (cT2, cN1, cM0, G3, ER-, PR-, HER2-) - Signed by Truitt Merle, MD on 03/10/2020   03/10/2020 Initial Diagnosis   Cancer of central portion of left female breast ([place])   03/16/2020 Breast MRI   IMPRESSION: 1. 8.1 x 7.8 x 6.6 cm biopsy proven invasive ductal carcinoma in the central right breast, involving 3 quadrants. 2. 3.0 x 1.7 x 1.1 cm satellite mass more inferiorly in lower inner quadrant of the left breast, compatible with additional malignancy. 3. Metastatic level 1 and level 2 left axillary lymph nodes. 4. No evidence of malignancy on the right.   03/17/2020 Imaging   IMPRESSION: CT CAP w contrast  1. Diffuse skin thickening in the left breast with left axillary and subpectoral lymphadenopathy, as well as a mildly enlarged left supraclavicular lymph node, which likely represents metastatic lymphadenopathy. No other definite extra nodal metastatic disease noted elsewhere in the chest, abdomen or pelvis. 2. Large mass in  the central anatomic pelvis which is of uncertain origin, potentially a large exophytic fibroid or a large solid mass arising from the right ovary. Further evaluation with pelvic ultrasound is strongly recommended.   03/21/2020 Imaging   Bone Scan  IMPRESSION: Apparent arthropathy at L5. No bony metastatic disease is demonstrable on this study. Scattered foci of abnormal uptake in a pelvic mass is of uncertain etiology given absence of calcification in this mass by CT. This mass compresses the urinary bladder. It is possible that some of the increased uptake in this area actually represents physiologic uptake within the bladder.    Kidneys noted in flank positions bilaterally.     03/22/2020 - 08/16/2020 Neo-Adjuvant Chemotherapy   Neoadjuvant Adriamycin and Cytoxan q2weeks for 4 cycles starting 03/22/20-05/03/20 followed by weekly Taxol and Carboplatin for 12 weeks starting 05/17/20-08/16/20   03/24/2020 Imaging   US Pelvis  IMPRESSION: 1. Large pedunculated lesion directly contiguous with the uterine most suggestive of a large subserosal fibroid which measures up to 8.2 cm. 2. Additional 1 cm probable intramural fibroid in the right anterior uterine body. 3. No other acute or worrisome pelvic abnormality.   03/29/2020 Genetic Testing   Negative genetic testing on the common hereditary cancer panel.  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  The Common Hereditary Gene Panel offered by [facility] includes sequencing and/or deletion duplication testing of the following 47 genes: APC, ATM, AXIN2, BARD1, BMPR1A, BRCA1, BRCA2, BRIP1, CDH1, CDK4, CDKN2A (p14ARF), CDKN2A (p16INK4a), CHEK2, CTNNA1, DICER1, EPCAM (Deletion/duplication testing only), GREM1 (promoter region deletion/duplication testing only), KIT, MEN1, MLH1, MSH2, MSH3, MSH6, MUTYH, NBN, NF1, NHTL1, PALB2, PDGFRA, PMS2, POLD1, POLE, PTEN, RAD50, RAD51C, RAD51D, SDHB, SDHC, SDHD, SMAD4, SMARCA4. STK11, TP53, TSC1, TSC2, and VHL.  The following genes were evaluated for sequence changes only: SDHA and HOXB13 c.251G>A variant only. The report date is 03/29/2020   04/28/2020 - 10/27/2020 Antibody Plan   Added Keytruda q3weeks starting 04/28/20 to complete 1 year of treatment. Held since 10/27/20 due to skin rash and body aches.    08/17/2020 Breast MRI   IMPRESSION: 1. The biopsy proven malignancy in the left breast and the adjacent satellite lesion have resolved in the interval. No abnormalities in these locations today. 2. No MRI evidence of malignancy in the right breast. 3. No adenopathy identified today   09/20/2020 Surgery   LEFT MODIFIED RADICAL MASTECTOMY by Dr  Donne Hazel   09/20/2020 Pathology Results   FINAL MICROSCOPIC DIAGNOSIS:   A. BREAST, LEFT, MODIFIED RADICAL MASTECTOMY:  - No residual invasive carcinoma in breast status post neoadjuvant  treatment.  - Metastatic carcinoma in (2) of (14) lymph nodes.  - Biopsy sites (one in breast, one in one of the positive lymph nodes).  - See oncology table.   B. ADDITIONAL AXILLARY CONTENTS, LEFT, DISSECTION:  - Metastatic carcinoma in (5) of (5) lymph nodes.    09/20/2020 Cancer Staging   Staging form: Breast, AJCC 8th Edition - Pathologic stage from 09/20/2020: No Stage Recommended (ypT0, pN2a, cM0, G3, ER-, PR-, HER2-) - Signed by Gardenia Phlegm, NP on 10/04/2020 Stage prefix: Post-therapy Histologic grading system: 3 grade system   10/30/2020 - 12/11/2020 Radiation Therapy   Adjuvant Radiation with Dr Sondra Come starting 10/30/20   10/30/2020 -  Chemotherapy   Adjuvant Xeloda starting 10/30/20 at 1540m BID M-F while on RT. Held since 11/20/2020 due to recurrent skin rash   01/18/2021 -  Chemotherapy    Patient is on Treatment Plan: COLORECTAL 5FU / LEUCOVORIN MODIFIED DFontana  Q14D X 12 CYCLES          CURRENT THERAPY:  5FU pump infusion every 2 weeks starting 01/18/21  INTERVAL HISTORY:  RUSSELL ENGELSTAD is here for a follow up of breast cancer. She was last seen by me on 02/15/21. She presents to the clinic alone. She reports the lesion to her breast became an abscess, and she was given oral antibiotics recently.  She reports the rash to her chest and shoulders is very itchy and hopes this means it is healing. She notes she received Kenalog shot recently with her dermatologist. She reports following removal of her pump she experiences pain behind her port ("it feels like a pinched nerve") that goes up her neck and down her arms. She reports this immobilizes her, and she has to sleep on her back and lay still. She notes it starts on Saturday night, after her pump is removed. She also  reports a sore throat and a nose bleed the other night. She notes the inside of her nose was tender/raw, and this has since resolved. She also reports mouth sores, but she did not like the magic mouthwash. She notes using saline rinses, which help some. She notes she has gained 60 lbs since she started treatment   All other systems were reviewed with the patient and are negative.  MEDICAL HISTORY:  Past Medical History:  Diagnosis Date   Anemia    Anxiety    Arthritis    Cancer ([place])    Left Breast   Depression    GERD (gastroesophageal reflux disease)    Headache    History of radiation therapy 10/30/20-12/11/20   left chest wall and axilla - Dr. Gery Pray   Panic attack     SURGICAL HISTORY: Past Surgical History:  Procedure Laterality Date   MASTECTOMY MODIFIED RADICAL Left 09/20/2020   Procedure: LEFT MODIFIED RADICAL MASTECTOMY;  Surgeon: [name], MD;  Location: [facility];  Service: General;  Laterality: Left;  RNFA; PEC BLOCK;   PORTACATH PLACEMENT Right 03/21/2020   Procedure: INSERTION PORT-A-CATH WITH ULTRASOUND GUIDANCE;  Surgeon: [name], MD;  Location: [facility];  Service: General;  Laterality: Right;    I have reviewed the social history and family history with the patient and they are unchanged from previous note.  ALLERGIES:  is allergic to medroxyprogesterone and xeloda [capecitabine].  MEDICATIONS:  Current Outpatient Medications  Medication Sig Dispense Refill   ALPRAZolam (XANAX) 1 MG tablet Take 1 mg by mouth 3 (three) times daily as needed for anxiety.      baclofen (LIORESAL) 10 MG tablet Take 1 tablet (10 mg total) by mouth 2 (two) times daily as needed for muscle spasms. 30 each 0   citalopram (CELEXA) 20 MG tablet Take 20 mg by mouth daily.     diphenhydrAMINE (BENADRYL) 25 mg capsule Take 25 mg by mouth every 6 (six) hours as needed for allergies.     diphenhydrAMINE HCl (ZZZQUIL) 50 MG/30ML LIQD Take 30 mLs by  mouth at bedtime as needed (sleep).     famotidine (PEPCID) 20 MG tablet TAKE 1 TABLET BY MOUTH TWICE A DAY 60 tablet 1   fluticasone (FLONASE) 50 MCG/ACT nasal spray Place 2 sprays into both nostrils daily as needed for allergies or rhinitis.     gabapentin (NEURONTIN) 100 MG capsule Take 1 capsule (100 mg total) by mouth 3 (three) times daily as needed (hot flashes). May increase to 374m three times daily as need in  a few weeks if tolerates well 90 capsule 1   ibuprofen (ADVIL) 200 MG tablet Take 800 mg by mouth every 8 (eight) hours as needed for moderate pain.     montelukast (SINGULAIR) 10 MG tablet TAKE 1 TABLET BY MOUTH EVERYDAY AT BEDTIME 30 tablet 0   triamcinolone ointment (KENALOG) 0.5 % Apply 1 application topically 2 (two) times daily. 30 g 2   No current facility-administered medications for this visit.   Facility-Administered Medications Ordered in Other Visits  Medication Dose Route Frequency Provider Last Rate Last Admin   sodium chloride flush (NS) 0.9 % injection 10 mL  10 mL Intravenous PRN Alla Feeling, NP   10 mL at 03/28/20 1104    PHYSICAL EXAMINATION: ECOG PERFORMANCE STATUS: 1 - Symptomatic but completely ambulatory  Vitals:   03/01/21 1325  BP: 122/79  Pulse: 92  Resp: 18  Temp: 98 F (36.7 C)  SpO2: 99%   Wt Readings from Last 3 Encounters:  03/01/21 228 lb 4.8 oz (103.6 kg)  02/15/21 221 lb 9.6 oz (100.5 kg)  01/29/21 218 lb 12.8 oz (99.2 kg)    GENERAL:alert, no distress and comfortable SKIN: skin color normal, no significant lesions, (+) rash to shoulders (with peeling) and upper chest, (+) healing abscess on right breast EYES: normal, Conjunctiva are pink and non-injected, sclera clear  NEURO: alert & oriented x 3 with fluent speech  LABORATORY DATA:  I have reviewed the data as listed CBC Latest Ref Rng & Units 03/01/2021 02/15/2021 01/29/2021  WBC 4.0 - 10.5 K/uL 1.0(L) 2.7(L) 3.5(L)  Hemoglobin 12.0 - 15.0 g/dL 10.8(L) 11.9(L) 12.1   Hematocrit 36.0 - 46.0 % 33.2(L) 36.4 36.5  Platelets 150 - 400 K/uL 142(L) 208 263     CMP Latest Ref Rng & Units 03/01/2021 02/15/2021 01/29/2021  Glucose 70 - 99 mg/dL 103(H) 114(H) 86  BUN 6 - 20 mg/dL 16 11 19   Creatinine 0.44 - 1.00 mg/dL 0.93 0.81 0.82  Sodium 135 - 145 mmol/L 138 141 138  Potassium 3.5 - 5.1 mmol/L 3.7 3.8 4.4  Chloride 98 - 111 mmol/L 105 108 102  CO2 22 - 32 mmol/L 24 24 27   Calcium 8.9 - 10.3 mg/dL 8.6(L) 9.0 9.5  Total Protein 6.5 - 8.1 g/dL 7.0 7.3 7.6  Total Bilirubin 0.3 - 1.2 mg/dL 0.5 0.2(L) 0.3  Alkaline Phos 38 - 126 U/L 211(H) 194(H) 201(H)  AST 15 - 41 U/L 30 27 19   ALT 0 - 44 U/L 39 34 25      RADIOGRAPHIC STUDIES: I have personally reviewed the radiological images as listed and agreed with the findings in the report. No results found.    No orders of the defined types were placed in this encounter.  All questions were answered. The patient knows to call the clinic with any problems, questions or concerns. No barriers to learning was detected. The total time spent in the appointment was 30 minutes.     Truitt Merle, MD 03/01/2021   I, Wilburn Mylar, am acting as scribe for Truitt Merle, MD.   I have reviewed the above documentation for accuracy and completeness, and I agree with the above.

## 2021-03-03 ENCOUNTER — Inpatient Hospital Stay: Payer: 59

## 2021-03-03 ENCOUNTER — Encounter: Payer: Self-pay | Admitting: Hematology

## 2021-03-03 NOTE — Addendum Note (Signed)
Addended by: Truitt Merle on: 03/03/2021 08:19 AM   Modules accepted: Orders

## 2021-03-04 ENCOUNTER — Encounter: Payer: Self-pay | Admitting: Hematology

## 2021-03-14 ENCOUNTER — Other Ambulatory Visit: Payer: Self-pay | Admitting: Hematology

## 2021-03-14 NOTE — Progress Notes (Signed)
The following biosimilar Udenyca ([id number]) has been selected for use in this patient.  Kennith Center, Pharm.D., CPP 03/14/2021'@9'$ :40 AM

## 2021-03-15 ENCOUNTER — Inpatient Hospital Stay: Payer: 59

## 2021-03-15 ENCOUNTER — Inpatient Hospital Stay: Payer: 59 | Attending: Hematology

## 2021-03-15 ENCOUNTER — Inpatient Hospital Stay (HOSPITAL_BASED_OUTPATIENT_CLINIC_OR_DEPARTMENT_OTHER): Payer: 59 | Admitting: Hematology

## 2021-03-15 ENCOUNTER — Encounter: Payer: Self-pay | Admitting: Hematology

## 2021-03-15 ENCOUNTER — Other Ambulatory Visit: Payer: Self-pay

## 2021-03-15 VITALS — BP 103/90 | HR 90 | Temp 98.2°F | Resp 18 | Ht 64.0 in | Wt 224.8 lb

## 2021-03-15 DIAGNOSIS — Z95828 Presence of other vascular implants and grafts: Secondary | ICD-10-CM

## 2021-03-15 DIAGNOSIS — Z5189 Encounter for other specified aftercare: Secondary | ICD-10-CM | POA: Diagnosis not present

## 2021-03-15 DIAGNOSIS — Z171 Estrogen receptor negative status [ER-]: Secondary | ICD-10-CM

## 2021-03-15 DIAGNOSIS — R21 Rash and other nonspecific skin eruption: Secondary | ICD-10-CM | POA: Insufficient documentation

## 2021-03-15 DIAGNOSIS — C50112 Malignant neoplasm of central portion of left female breast: Secondary | ICD-10-CM | POA: Diagnosis not present

## 2021-03-15 DIAGNOSIS — R232 Flushing: Secondary | ICD-10-CM | POA: Insufficient documentation

## 2021-03-15 DIAGNOSIS — K219 Gastro-esophageal reflux disease without esophagitis: Secondary | ICD-10-CM | POA: Diagnosis not present

## 2021-03-15 DIAGNOSIS — Z79899 Other long term (current) drug therapy: Secondary | ICD-10-CM | POA: Diagnosis not present

## 2021-03-15 DIAGNOSIS — F419 Anxiety disorder, unspecified: Secondary | ICD-10-CM | POA: Diagnosis not present

## 2021-03-15 DIAGNOSIS — Z5111 Encounter for antineoplastic chemotherapy: Secondary | ICD-10-CM | POA: Insufficient documentation

## 2021-03-15 DIAGNOSIS — Z8616 Personal history of COVID-19: Secondary | ICD-10-CM | POA: Insufficient documentation

## 2021-03-15 DIAGNOSIS — L930 Discoid lupus erythematosus: Secondary | ICD-10-CM | POA: Diagnosis not present

## 2021-03-15 DIAGNOSIS — Z923 Personal history of irradiation: Secondary | ICD-10-CM | POA: Diagnosis not present

## 2021-03-15 LAB — CMP (CANCER CENTER ONLY)
ALT: 53 U/L — ABNORMAL HIGH (ref 0–44)
AST: 48 U/L — ABNORMAL HIGH (ref 15–41)
Albumin: 3.9 g/dL (ref 3.5–5.0)
Alkaline Phosphatase: 193 U/L — ABNORMAL HIGH (ref 38–126)
Anion gap: 12 (ref 5–15)
BUN: 19 mg/dL (ref 6–20)
CO2: 23 mmol/L (ref 22–32)
Calcium: 9.4 mg/dL (ref 8.9–10.3)
Chloride: 103 mmol/L (ref 98–111)
Creatinine: 0.86 mg/dL (ref 0.44–1.00)
GFR, Estimated: >60 mL/min (ref >60)
Glucose, Bld: 85 mg/dL (ref 70–99)
Potassium: 4.3 mmol/L (ref 3.5–5.1)
Sodium: 138 mmol/L (ref 135–145)
Total Bilirubin: 1.2 mg/dL (ref 0.3–1.2)
Total Protein: 7.9 g/dL (ref 6.5–8.1)

## 2021-03-15 LAB — URINALYSIS, COMPLETE (UACMP) WITH MICROSCOPIC
Bacteria, UA: NONE SEEN
Bilirubin Urine: NEGATIVE
Glucose, UA: NEGATIVE mg/dL
Hgb urine dipstick: NEGATIVE
Ketones, ur: NEGATIVE mg/dL
Nitrite: NEGATIVE
Protein, ur: NEGATIVE mg/dL
Specific Gravity, Urine: 1.021 (ref 1.005–1.030)
pH: 6 (ref 5.0–8.0)

## 2021-03-15 LAB — CBC WITH DIFFERENTIAL (CANCER CENTER ONLY)
Abs Immature Granulocytes: 0.07 10*3/uL (ref 0.00–0.07)
Basophils Absolute: 0 10*3/uL (ref 0.0–0.1)
Basophils Relative: 0 %
Eosinophils Absolute: 0 10*3/uL (ref 0.0–0.5)
Eosinophils Relative: 0 %
HCT: 37 % (ref 36.0–46.0)
Hemoglobin: 12.2 g/dL (ref 12.0–15.0)
Immature Granulocytes: 1 %
Lymphocytes Relative: 7 %
Lymphs Abs: 0.7 10*3/uL (ref 0.7–4.0)
MCH: 31 pg (ref 26.0–34.0)
MCHC: 33 g/dL (ref 30.0–36.0)
MCV: 93.9 fL (ref 80.0–100.0)
Monocytes Absolute: 0.8 10*3/uL (ref 0.1–1.0)
Monocytes Relative: 8 %
Neutro Abs: 8.1 10*3/uL — ABNORMAL HIGH (ref 1.7–7.7)
Neutrophils Relative %: 84 %
Platelet Count: 205 10*3/uL (ref 150–400)
RBC: 3.94 MIL/uL (ref 3.87–5.11)
RDW: 17.6 % — ABNORMAL HIGH (ref 11.5–15.5)
WBC Count: 9.7 10*3/uL (ref 4.0–10.5)
nRBC: 0 % (ref 0.0–0.2)

## 2021-03-15 MED ORDER — SODIUM CHLORIDE 0.9% FLUSH
10.0000 mL | INTRAVENOUS | Status: DC | PRN
Start: 2021-03-15 — End: 2021-03-15

## 2021-03-15 MED ORDER — SODIUM CHLORIDE 0.9 % IV SOLN
400.0000 mg/m2 | Freq: Once | INTRAVENOUS | Status: AC
  Administered 2021-03-15: 844 mg via INTRAVENOUS
  Filled 2021-03-15: qty 42.2

## 2021-03-15 MED ORDER — SODIUM CHLORIDE 0.9% FLUSH
10.0000 mL | Freq: Once | INTRAVENOUS | Status: AC
  Administered 2021-03-15: 10 mL

## 2021-03-15 MED ORDER — SODIUM CHLORIDE 0.9 % IV SOLN
2381.0000 mg/m2 | INTRAVENOUS | Status: DC
  Administered 2021-03-15: 5000 mg via INTRAVENOUS
  Filled 2021-03-15: qty 100

## 2021-03-15 MED ORDER — PROCHLORPERAZINE MALEATE 10 MG PO TABS
10.0000 mg | ORAL_TABLET | Freq: Once | ORAL | Status: AC
  Administered 2021-03-15: 10 mg via ORAL
  Filled 2021-03-15: qty 1

## 2021-03-15 MED ORDER — LORATADINE 10 MG PO TABS
10.0000 mg | ORAL_TABLET | Freq: Once | ORAL | Status: AC
  Administered 2021-03-15: 10 mg via ORAL
  Filled 2021-03-15: qty 1

## 2021-03-15 MED ORDER — SODIUM CHLORIDE 0.9 % IV SOLN
Freq: Once | INTRAVENOUS | Status: AC
Start: 2021-03-15 — End: 2021-03-15

## 2021-03-15 MED ORDER — HEPARIN SOD (PORK) LOCK FLUSH 100 UNIT/ML IV SOLN: 500.0000 [IU] | Freq: Once | INTRAVENOUS | Status: DC | PRN

## 2021-03-15 NOTE — Progress Notes (Addendum)
[facility]   Telephone:(336) [phone] Fax:(336) [phone]   Clinic Follow up Note   Patient Care Team: [name], MD as PCP - General (Family Medicine) Mauro Kaufmann, RN as Oncology Nurse Navigator Rockwell Germany, RN as Oncology Nurse Navigator [name], MD as Consulting Physician (General Surgery) Truitt Merle, MD as Consulting Physician (Hematology) Gery Pray, MD as Consulting Physician (Radiation Oncology)  Date of Service:  03/15/2021  CHIEF COMPLAINT: f/u of left breast cancer  CURRENT THERAPY:  5FU pump infusion every 2 weeks starting 01/18/21  ASSESSMENT & PLAN:  Caitlyn Hendricks is a 40 y.o. female with   1. Skin rashes on front upper chest and head, drug rashes, Lupus -She had several episodes of rash since she started chemo treatment, she had a skin biopsy which showed a drug-related dermatitis.   -She has residual dermatitis s/p radiation therapy, which she completed 12/11/20. The moist desquamation has resolved. -Since restarting Xeloda on 01/01/21, she again developed a rash across her upper chest, back, and arms. The Claritin, famotidine and Singulair helped to make the rash more tolerable than previously.  -She was switched to 5FU on 01/18/21. The rash on her chest and left shoulder improved some but persist  -she underwent lab work with her dermatologist on 02/27/21. Results revealed cutaneous Lupus. She is now on medication for this.   2. 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 Mammogram/US on 02/23/20 showed 2 adjacent left breast masses at 12 o'clock spanning 3.6cm and multiple enlarged left axillary (at least 5) and left subpectoral LNs. Her 03/08/20 biopsy revealed grade III invasive ductal carcinoma, triple negative, metastatic to her left axillary LN. CT CAP/Bone scan negative for  distant mets.  -She completed neoadjuvant chemo with ddAC q2weeks for 4 cycles 03/22/20-05/03/20. Followed by weekly carbo/taxol for 12 weeks 05/17/20-08/16/20 before proceeding with surgery.  -she started Ballard Russell for 1 year treatment on 04/28/20, but stopped in 10/2020 due to skin rash and arthralgia.   -She underwent left mastectomy with Dr Donne Hazel on 09/20/20. Surgical path shows No residual invasive carcinoma in breast status post neoadjuvant treatment, however has 7/19 positive LN.  -She started oral Xeloda on 10/30/20 but held starting 11/20/20 due to skin rash.  -She received adjuvant radiation with Dr Sondra Come from 10/30/20 through 12/11/20.  -She restarted Xeloda on 01/01/21 and again developed a rash, as well as joint pain. -We switched her treatment to 5-FU on 01/18/21 and added 5-FU bolus and leucovorin with cycle 2.  -C4 held due to severe neutropenia. [name] today is now high at 8.1 (03/15/21). She will proceed to C4 5-FU infusion today. We discussed switching back to Xeloda, since her skin rash is most from lupusand she is on medication, I feel this would be okay. She would prefer the oral Xeloda over the pump. I will cancel her next infusion, and she will restart Xeloda in 2 weeks.   3. Anxiety  -She has a history of anxiety and has been on Xanax 108m TID and Celexa 474m -she is on low dose Ambien for sleep.  -She has very good social support from mother and boyfriend. 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 -She has developed worsening anxiety lately, due to stress from cancer diagnosis, skin rash, not able to go out due to rash etc.  -  She feels that she has had adequate counseling, and that talking about it makes it worse. -We previously provided her with information about the [facility] in [facility] and encouraged her to call for appointment.    4. Hot flashes -Secondary to chemo-induced perimenopause -Managed with gabapentin and  celexa -Last period 03/2020 prior to starting chemo until she had another period 01/27/2021   5. Genetic Testing negative for pathogenetic mutations with VUS of POLE gene   6. COVID (+) on 08/02/20 with symptoms of Hoarseness and ribcage pain. She has recovered quickly and completely       PLAN: -Proceed with C4 5FU today with pump d/c in 2 days -Udenyca and iv dexamethasone for rash in 2 days -Cancel next infusion on 03/29/21, will restart Xeloda in 2 weeks -lab and f/u in 2 weeks with surveillance CT chest, abdomen pelvis with contrast a few days before   No problem-specific Assessment & Plan notes found for this encounter.   SUMMARY OF ONCOLOGIC HISTORY: Oncology History Overview Note  Cancer Staging Cancer of central portion of left female breast [facility]) Staging form: Breast, AJCC 8th Edition - Clinical: No stage assigned - Unsigned    Cancer of central portion of left female breast ([place])  02/23/2020 Mammogram   Diagnostic Mammogram 02/23/20  IMPRESSION The 2x1x2.6cm irregular mass in teh left breast at 12:00 posiiton middle depth is highly suspicious of malignancy. An [place] is recommended for further evaluation and biopsy planning purposes.   02/23/2020 Breast US   [name] 02/23/20  IMPRESSION 2 adjacent spiculated masses in the left brast at 12:00 position 3 cm from the nipple (2.1x0.9x1.1cm and 1.1x1.4x0.5cm) is suggestive of malignancy.   Multiple abnormal left subpectoral and left axillary nodes measuring 3.3x2.1 cm concerning for metastatic adenopathy.    Left breast skin thickening and edema may be secondary congestive edema due to extensive axillary adenopathy.    03/08/2020 Initial Biopsy   Diagnosis 1.Breast, left, needle core biopsy, 12:00 position, 3cmfn -INVASIVE DUCTAL CARCINOMA -SEE COMMENT  2. Lymph node, needle/core biopsy, left axilla -METASTATIC CARCINOMA INVOLVING A LYMPH NODE  -LYMPHOVASCULAR SPACE INVASION PRESENT   Microscopic Comment  1.Based  on the biopsy the carcinoma appears Nottingham Grade 3 or 3 and measures 1 cm in the greatest linear extent.    03/08/2020 Receptors her2   ER- Negative 0% PR - Negative 0% HER2 - Negative  KI 67 - 80%    03/08/2020 Cancer Staging   Staging form: Breast, AJCC 8th Edition - Clinical stage from 03/08/2020: Stage IIIB (cT2, cN1, cM0, G3, ER-, PR-, HER2-) - Signed by Truitt Merle, MD on 03/10/2020   03/10/2020 Initial Diagnosis   Cancer of central portion of left female breast ([place])   03/16/2020 Breast MRI   IMPRESSION: 1. 8.1 x 7.8 x 6.6 cm biopsy proven invasive ductal carcinoma in the central right breast, involving 3 quadrants. 2. 3.0 x 1.7 x 1.1 cm satellite mass more inferiorly in lower inner quadrant of the left breast, compatible with additional malignancy. 3. Metastatic level 1 and level 2 left axillary lymph nodes. 4. No evidence of malignancy on the right.   03/17/2020 Imaging   IMPRESSION: CT CAP w contrast  1. Diffuse skin thickening in the left breast with left axillary and subpectoral lymphadenopathy, as well as a mildly enlarged left supraclavicular lymph node, which likely represents metastatic lymphadenopathy. No other definite extra nodal metastatic disease noted elsewhere in the chest, abdomen or pelvis. 2. Large mass in the  central anatomic pelvis which is of uncertain origin, potentially a large exophytic fibroid or a large solid mass arising from the right ovary. Further evaluation with pelvic ultrasound is strongly recommended.   03/21/2020 Imaging   Bone Scan  IMPRESSION: Apparent arthropathy at L5. No bony metastatic disease is demonstrable on this study. Scattered foci of abnormal uptake in a pelvic mass is of uncertain etiology given absence of calcification in this mass by CT. This mass compresses the urinary bladder. It is possible that some of the increased uptake in this area actually represents physiologic uptake within the bladder.   Kidneys noted in flank  positions bilaterally.     03/22/2020 - 08/16/2020 Neo-Adjuvant Chemotherapy   Neoadjuvant Adriamycin and Cytoxan q2weeks for 4 cycles starting 03/22/20-05/03/20 followed by weekly Taxol and Carboplatin for 12 weeks starting 05/17/20-08/16/20   03/24/2020 Imaging   US Pelvis  IMPRESSION: 1. Large pedunculated lesion directly contiguous with the uterine most suggestive of a large subserosal fibroid which measures up to 8.2 cm. 2. Additional 1 cm probable intramural fibroid in the right anterior uterine body. 3. No other acute or worrisome pelvic abnormality.   03/29/2020 Genetic Testing   Negative genetic testing on the common hereditary cancer panel.  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  The Common Hereditary Gene Panel offered by [facility] includes sequencing and/or deletion duplication testing of the following 47 genes: APC, ATM, AXIN2, BARD1, BMPR1A, BRCA1, BRCA2, BRIP1, CDH1, CDK4, CDKN2A (p14ARF), CDKN2A (p16INK4a), CHEK2, CTNNA1, DICER1, EPCAM (Deletion/duplication testing only), GREM1 (promoter region deletion/duplication testing only), KIT, MEN1, MLH1, MSH2, MSH3, MSH6, MUTYH, NBN, NF1, NHTL1, PALB2, PDGFRA, PMS2, POLD1, POLE, PTEN, RAD50, RAD51C, RAD51D, SDHB, SDHC, SDHD, SMAD4, SMARCA4. STK11, TP53, TSC1, TSC2, and VHL.  The following genes were evaluated for sequence changes only: SDHA and HOXB13 c.251G>A variant only. The report date is 03/29/2020   04/28/2020 - 10/27/2020 Antibody Plan   Added Keytruda q3weeks starting 04/28/20 to complete 1 year of treatment. Held since 10/27/20 due to skin rash and body aches.    08/17/2020 Breast MRI   IMPRESSION: 1. The biopsy proven malignancy in the left breast and the adjacent satellite lesion have resolved in the interval. No abnormalities in these locations today. 2. No MRI evidence of malignancy in the right breast. 3. No adenopathy identified today   09/20/2020 Surgery   LEFT MODIFIED RADICAL MASTECTOMY by Dr Donne Hazel   09/20/2020 Pathology  Results   FINAL MICROSCOPIC DIAGNOSIS:   A. BREAST, LEFT, MODIFIED RADICAL MASTECTOMY:  - No residual invasive carcinoma in breast status post neoadjuvant  treatment.  - Metastatic carcinoma in (2) of (14) lymph nodes.  - Biopsy sites (one in breast, one in one of the positive lymph nodes).  - See oncology table.   B. ADDITIONAL AXILLARY CONTENTS, LEFT, DISSECTION:  - Metastatic carcinoma in (5) of (5) lymph nodes.    09/20/2020 Cancer Staging   Staging form: Breast, AJCC 8th Edition - Pathologic stage from 09/20/2020: No Stage Recommended (ypT0, pN2a, cM0, G3, ER-, PR-, HER2-) - Signed by Gardenia Phlegm, NP on 10/04/2020 Stage prefix: Post-therapy Histologic grading system: 3 grade system   10/30/2020 - 12/11/2020 Radiation Therapy   Adjuvant Radiation with Dr Sondra Come starting 10/30/20   10/30/2020 -  Chemotherapy   Adjuvant Xeloda starting 10/30/20 at 1575m BID M-F while on RT. Held since 11/20/2020 due to recurrent skin rash   01/18/2021 -  Chemotherapy    Patient is on Treatment Plan: COLORECTAL 5FU / LEUCOVORIN MODIFIED DEGRAMONT Q14D  X 12 CYCLES          INTERVAL HISTORY:  Caitlyn Hendricks is here for a follow up of breast cancer. She was last seen by me on 03/01/21. She presents to the clinic accompanied by her mother. She reports her rash is continuing to improve. She reports she was diagnosed with Lupus by her dermatologist. The lab panel was scanned into our system last week.   All other systems were reviewed with the patient and are negative.  MEDICAL HISTORY:  Past Medical History:  Diagnosis Date   Anemia    Anxiety    Arthritis    Cancer ([place])    Left Breast   Depression    GERD (gastroesophageal reflux disease)    Headache    History of radiation therapy 10/30/20-12/11/20   left chest wall and axilla - Dr. Gery Pray   Panic attack     SURGICAL HISTORY: Past Surgical History:  Procedure Laterality Date   MASTECTOMY MODIFIED RADICAL Left 09/20/2020    Procedure: LEFT MODIFIED RADICAL MASTECTOMY;  Surgeon: [name], MD;  Location: [facility];  Service: General;  Laterality: Left;  RNFA; PEC BLOCK;   PORTACATH PLACEMENT Right 03/21/2020   Procedure: INSERTION PORT-A-CATH WITH ULTRASOUND GUIDANCE;  Surgeon: [name], MD;  Location: [facility];  Service: General;  Laterality: Right;    I have reviewed the social history and family history with the patient and they are unchanged from previous note.  ALLERGIES:  is allergic to medroxyprogesterone and xeloda [capecitabine].  MEDICATIONS:  Current Outpatient Medications  Medication Sig Dispense Refill   ALPRAZolam (XANAX) 1 MG tablet Take 1 mg by mouth 3 (three) times daily as needed for anxiety.      baclofen (LIORESAL) 10 MG tablet Take 1 tablet (10 mg total) by mouth 2 (two) times daily as needed for muscle spasms. 30 each 0   citalopram (CELEXA) 20 MG tablet Take 20 mg by mouth daily.     diphenhydrAMINE (BENADRYL) 25 mg capsule Take 25 mg by mouth every 6 (six) hours as needed for allergies.     diphenhydrAMINE HCl (ZZZQUIL) 50 MG/30ML LIQD Take 30 mLs by mouth at bedtime as needed (sleep).     famotidine (PEPCID) 20 MG tablet TAKE 1 TABLET BY MOUTH TWICE A DAY 60 tablet 1   fluticasone (FLONASE) 50 MCG/ACT nasal spray Place 2 sprays into both nostrils daily as needed for allergies or rhinitis.     gabapentin (NEURONTIN) 100 MG capsule Take 1 capsule (100 mg total) by mouth 3 (three) times daily as needed (hot flashes). May increase to 340m three times daily as need in a few weeks if tolerates well 90 capsule 1   ibuprofen (ADVIL) 200 MG tablet Take 800 mg by mouth every 8 (eight) hours as needed for moderate pain.     montelukast (SINGULAIR) 10 MG tablet TAKE 1 TABLET BY MOUTH EVERYDAY AT BEDTIME 30 tablet 0   triamcinolone ointment (KENALOG) 0.5 % Apply 1 application topically 2 (two) times daily. 30 g 2   No current facility-administered medications for  this visit.   Facility-Administered Medications Ordered in Other Visits  Medication Dose Route Frequency Provider Last Rate Last Admin   fluorouracil (ADRUCIL) 5,000 mg in sodium chloride 0.9 % 150 mL chemo infusion  2,381 mg/m2 (Treatment Plan Recorded) Intravenous 1 day or 1 dose FTruitt Merle MD   5,000 mg at 03/15/21 1540   heparin lock flush 100 unit/mL  500 Units Intracatheter Once  PRN Truitt Merle, MD       sodium chloride flush (NS) 0.9 % injection 10 mL  10 mL Intravenous PRN Alla Feeling, NP   10 mL at 03/28/20 1104   sodium chloride flush (NS) 0.9 % injection 10 mL  10 mL Intracatheter PRN Truitt Merle, MD        PHYSICAL EXAMINATION: ECOG PERFORMANCE STATUS: 1 - Symptomatic but completely ambulatory  Vitals:   03/15/21 1318  BP: 103/90  Pulse: 90  Resp: 18  Temp: 98.2 F (36.8 C)  SpO2: 100%   Wt Readings from Last 3 Encounters:  03/15/21 224 lb 12.8 oz (102 kg)  03/01/21 228 lb 4.8 oz (103.6 kg)  02/15/21 221 lb 9.6 oz (100.5 kg)     GENERAL:alert, no distress and comfortable SKIN: skin color normal, no significant lesions, diffuse skin erythema and a rash in the upper chest and the face  EYES: normal, Conjunctiva are pink and non-injected, sclera clear  NEURO: alert & oriented x 3 with fluent speech  LABORATORY DATA:  I have reviewed the data as listed CBC Latest Ref Rng & Units 03/15/2021 03/01/2021 02/15/2021  WBC 4.0 - 10.5 K/uL 9.7 1.0(L) 2.7(L)  Hemoglobin 12.0 - 15.0 g/dL 12.2 10.8(L) 11.9(L)  Hematocrit 36.0 - 46.0 % 37.0 33.2(L) 36.4  Platelets 150 - 400 K/uL 205 142(L) 208     CMP Latest Ref Rng & Units 03/15/2021 03/01/2021 02/15/2021  Glucose 70 - 99 mg/dL 85 103(H) 114(H)  BUN 6 - 20 mg/dL _0 Creatinine 0.44 - 1.00 mg/dL 0.86 0.93 0.81  Sodium 135 - 145 mmol/L 138 138 141  Potassium 3.5 - 5.1 mmol/L 4.3 3.7 3.8  Chloride 98 - 111 mmol/L 103 105 108  CO2 22 - 32 mmol/L _1 Calcium 8.9 - 10.3 mg/dL 9.4 8.6(L) 9.0  Total Protein 6.5 - 8.1 g/dL  7.9 7.0 7.3  Total Bilirubin 0.3 - 1.2 mg/dL 1.2 0.5 0.2(L)  Alkaline Phos 38 - 126 U/L 193(H) 211(H) 194(H)  AST 15 - 41 U/L 48(H) 30 27  ALT 0 - 44 U/L 53(H) 39 34      RADIOGRAPHIC STUDIES: I have personally reviewed the radiological images as listed and agreed with the findings in the report. No results found.    Orders Placed This Encounter  Procedures   CT CHEST ABDOMEN PELVIS W CONTRAST    Standing Status:   Future    Standing Expiration Date:   03/15/2022    Order Specific Question:   Is patient pregnant?    Answer:   No    Order Specific Question:   Preferred imaging location?    Answer:   [facility]    Order Specific Question:   Is Oral Contrast requested for this exam?    Answer:   Yes, Per Radiology protocol    Order Specific Question:   Reason for Exam (SYMPTOM  OR DIAGNOSIS REQUIRED)    Answer:   rule out recurrence   Urinalysis, Complete w Microscopic    Standing Status:   Future    Number of Occurrences:   1    Standing Expiration Date:   03/15/2022   All questions were answered. The patient knows to call the clinic with any problems, questions or concerns. No barriers to learning was detected. The total time spent in the appointment was 30 minutes.     Truitt Merle, MD 03/15/2021   I, Wilburn Mylar, am acting as scribe  for Truitt Merle, MD.   I have reviewed the above documentation for accuracy and completeness, and I agree with the above.

## 2021-03-15 NOTE — Patient Instructions (Signed)
[facility] ONCOLOGY  Discharge Instructions: Thank you for choosing [facility] to provide your oncology and hematology care.   If you have a lab appointment with the [facility], please go directly to the [facility] and check in at the registration area.   Wear comfortable clothing and clothing appropriate for easy access to any Portacath or PICC line.   We strive to give you quality time with your provider. You may need to reschedule your appointment if you arrive late (15 or more minutes).  Arriving late affects you and other patients whose appointments are after yours.  Also, if you miss three or more appointments without notifying the office, you may be dismissed from the clinic at the provider's discretion.      For prescription refill requests, have your pharmacy contact our office and allow 72 hours for refills to be completed.    Today you received the following chemotherapy and/or immunotherapy agents leucovorin and 5 FU      To help prevent nausea and vomiting after your treatment, we encourage you to take your nausea medication as directed.  BELOW ARE SYMPTOMS THAT SHOULD BE REPORTED IMMEDIATELY: *FEVER GREATER THAN 100.4 F (38 C) OR HIGHER *CHILLS OR SWEATING *NAUSEA AND VOMITING THAT IS NOT CONTROLLED WITH YOUR NAUSEA MEDICATION *UNUSUAL SHORTNESS OF BREATH *UNUSUAL BRUISING OR BLEEDING *URINARY PROBLEMS (pain or burning when urinating, or frequent urination) *BOWEL PROBLEMS (unusual diarrhea, constipation, pain near the anus) TENDERNESS IN MOUTH AND THROAT WITH OR WITHOUT PRESENCE OF ULCERS (sore throat, sores in mouth, or a toothache) UNUSUAL RASH, SWELLING OR PAIN  UNUSUAL VAGINAL DISCHARGE OR ITCHING   Items with * indicate a potential emergency and should be followed up as soon as possible or go to the Emergency Department if any problems should occur.  Please show the CHEMOTHERAPY ALERT CARD or IMMUNOTHERAPY ALERT CARD at  check-in to the Emergency Department and triage nurse.  Should you have questions after your visit or need to cancel or reschedule your appointment, please contact [facility]  Dept: [phone]  and follow the prompts.  Office hours are 8:00 a.m. to 4:30 p.m. Monday - Friday. Please note that voicemails left after 4:00 p.m. may not be returned until the following business day.  We are closed weekends and major holidays. You have access to a nurse at all times for urgent questions. Please call the main number to the clinic Dept: [phone] and follow the prompts.   For any non-urgent questions, you may also contact your provider using MyChart. We now offer e-Visits for anyone 34 and older to request care online for non-urgent symptoms. For details visit [email or web address].   Also download the MyChart app! Go to the app store, search "MyChart", open the app, select [facility], and log in with your MyChart username and password.  Due to Covid, a mask is required upon entering the hospital/clinic. If you do not have a mask, one will be given to you upon arrival. For doctor visits, patients may have 1 support person aged 68 or older with them. For treatment visits, patients cannot have anyone with them due to current Covid guidelines and our immunocompromised population.

## 2021-03-17 ENCOUNTER — Other Ambulatory Visit: Payer: Self-pay

## 2021-03-17 ENCOUNTER — Encounter: Payer: Self-pay | Admitting: Hematology

## 2021-03-17 ENCOUNTER — Inpatient Hospital Stay: Payer: 59

## 2021-03-17 VITALS — BP 124/89 | HR 99 | Temp 97.5°F | Resp 20

## 2021-03-17 DIAGNOSIS — C50112 Malignant neoplasm of central portion of left female breast: Secondary | ICD-10-CM

## 2021-03-17 DIAGNOSIS — Z95828 Presence of other vascular implants and grafts: Secondary | ICD-10-CM

## 2021-03-17 DIAGNOSIS — Z171 Estrogen receptor negative status [ER-]: Secondary | ICD-10-CM

## 2021-03-17 DIAGNOSIS — Z5111 Encounter for antineoplastic chemotherapy: Secondary | ICD-10-CM | POA: Diagnosis not present

## 2021-03-17 MED ORDER — SODIUM CHLORIDE 0.9% FLUSH
10.0000 mL | Freq: Once | INTRAVENOUS | Status: AC
  Administered 2021-03-17: 10 mL

## 2021-03-17 MED ORDER — HEPARIN SOD (PORK) LOCK FLUSH 100 UNIT/ML IV SOLN
500.0000 [IU] | Freq: Once | INTRAVENOUS | Status: AC
  Administered 2021-03-17: 500 [IU]

## 2021-03-17 MED ORDER — PEGFILGRASTIM-CBQV 6 MG/0.6ML ~~LOC~~ SOSY
6.0000 mg | PREFILLED_SYRINGE | Freq: Once | SUBCUTANEOUS | Status: AC
  Administered 2021-03-17: 6 mg via SUBCUTANEOUS

## 2021-03-17 MED ORDER — PEGFILGRASTIM-CBQV 6 MG/0.6ML ~~LOC~~ SOSY
PREFILLED_SYRINGE | SUBCUTANEOUS | Status: AC
  Filled 2021-03-17: qty 0.6

## 2021-03-17 MED ORDER — DEXAMETHASONE SODIUM PHOSPHATE 10 MG/ML IJ SOLN
INTRAMUSCULAR | Status: AC
  Administered 2021-03-17: 10 mg
  Filled 2021-03-17: qty 2

## 2021-03-19 ENCOUNTER — Ambulatory Visit: Payer: 59 | Attending: "Endocrinology

## 2021-03-23 ENCOUNTER — Telehealth: Payer: Self-pay

## 2021-03-23 NOTE — Telephone Encounter (Signed)
This nurse spoke with patient about scheduling CT scan.  Patient agrees and request to have an early appointment time. Patient then stated that she will see appointment dates on My Chart.  This nurse informed patient that she will need to pick up the scan prep at anytime prior to exam day and to have nothing to eat or drink 4 hours prior to scan.  Patient acknowledged understanding.  No further questions or concerns at this time.

## 2021-03-28 ENCOUNTER — Encounter (HOSPITAL_COMMUNITY): Payer: Self-pay

## 2021-03-28 ENCOUNTER — Ambulatory Visit (HOSPITAL_COMMUNITY)
Admission: RE | Admit: 2021-03-28 | Discharge: 2021-03-28 | Disposition: A | Discharge status: Home / Self Care | Payer: 59 | Source: Ambulatory Visit | Attending: Hematology | Admitting: Hematology

## 2021-03-28 ENCOUNTER — Other Ambulatory Visit: Payer: Self-pay

## 2021-03-28 DIAGNOSIS — C50112 Malignant neoplasm of central portion of left female breast: Secondary | ICD-10-CM | POA: Insufficient documentation

## 2021-03-28 DIAGNOSIS — Z171 Estrogen receptor negative status [ER-]: Secondary | ICD-10-CM | POA: Insufficient documentation

## 2021-03-28 IMAGING — CT CT CHEST-ABD-PELV W/ CM
2 of 4 series · 13 of 36 positions shown, 15 images · IV contrast (omnipaque)
Comparison: CT the chest, abdomen and pelvis [DATE].

CLINICAL DATA: 40-year-old female with history of breast cancer
diagnosed in [H4]. Status post left sided mastectomy and radiation
therapy (completed). Chemotherapy in progress. Follow-up study.

EXAM:
CT CHEST, ABDOMEN, AND PELVIS WITH CONTRAST
TECHNIQUE: Multidetector CT imaging of the chest, abdomen and pelvis was
performed following the standard protocol during bolus
administration of intravenous contrast.
CONTRAST:  80mL OMNIPAQUE IOHEXOL 350 MG/ML SOLN

[Series 2: cap with · axial · 0.75mm/px · z∈[-613,-63]mm · 10 of 134 slices shown, 12 images]
[im 12/134  mediastinal]
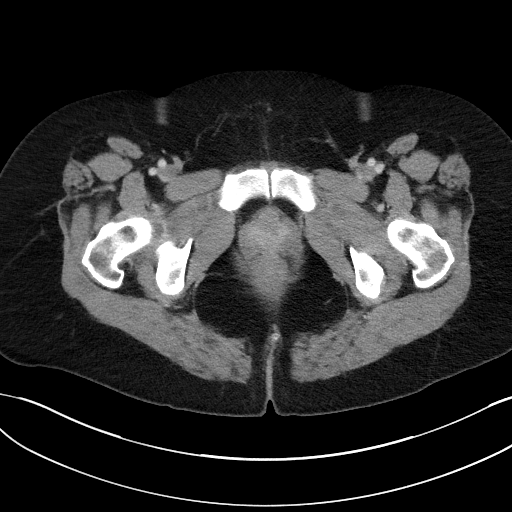
[im 12/134  bone]
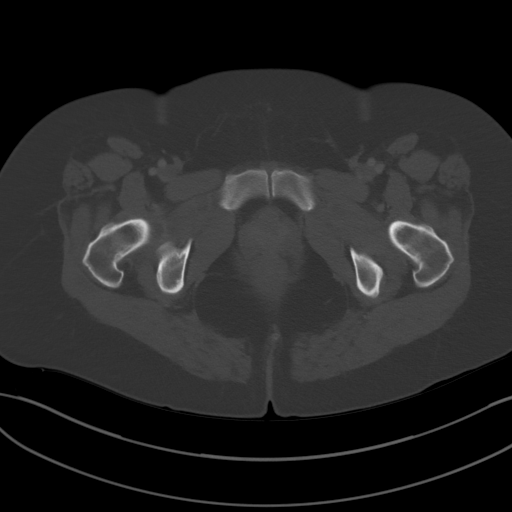
[im 23/134  mediastinal]
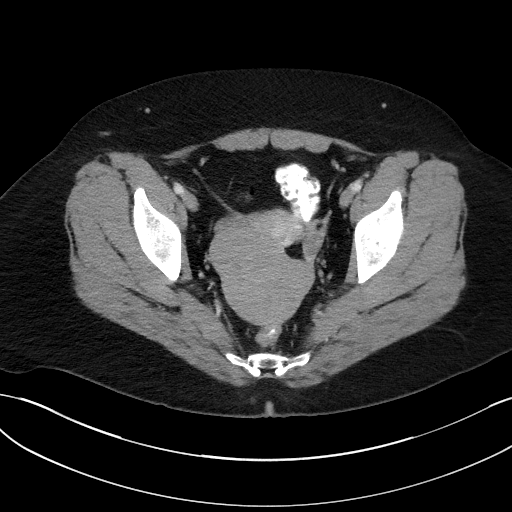
[im 34/134  mediastinal]
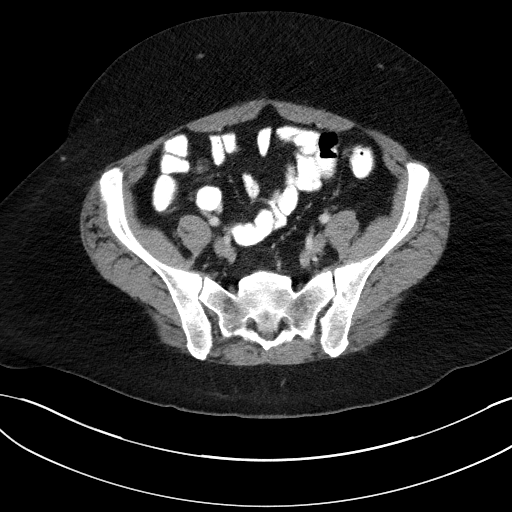
[im 45/134  mediastinal]
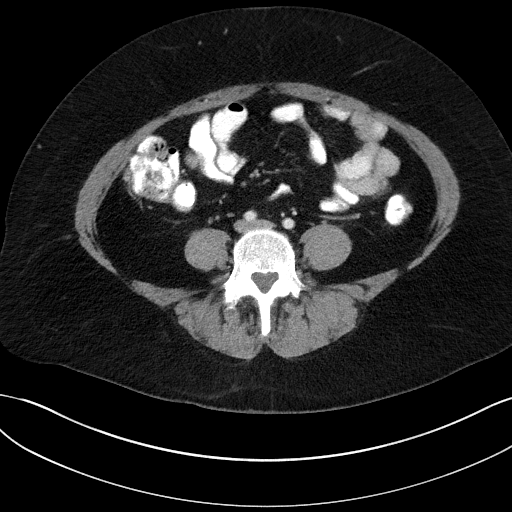
[im 56/134  mediastinal]
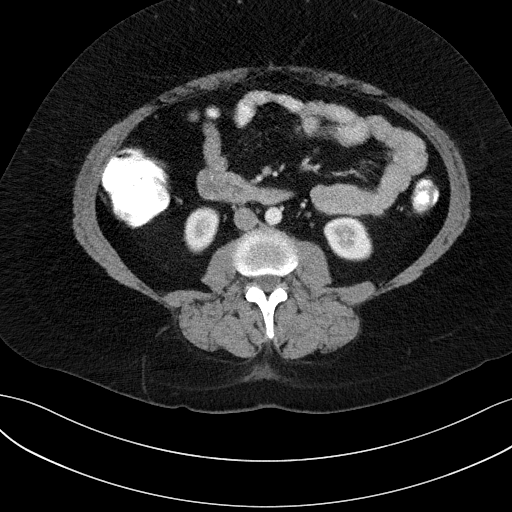
[im 78/134  mediastinal]
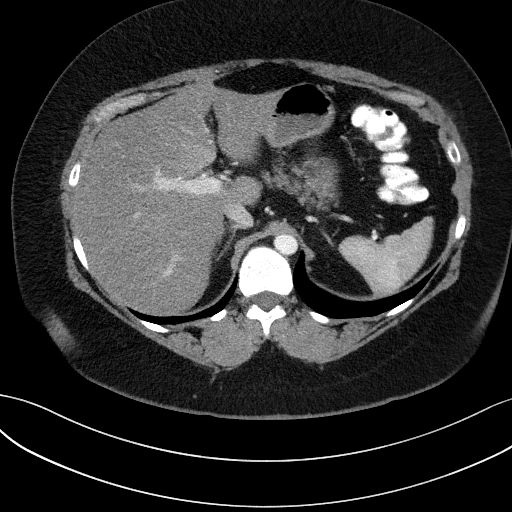
[im 89/134  mediastinal]
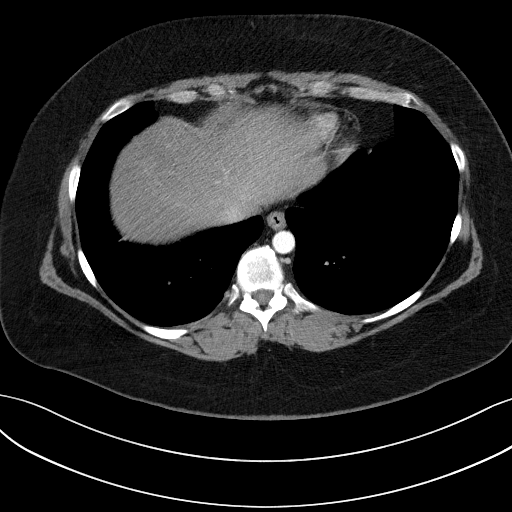
[im 100/134  mediastinal]
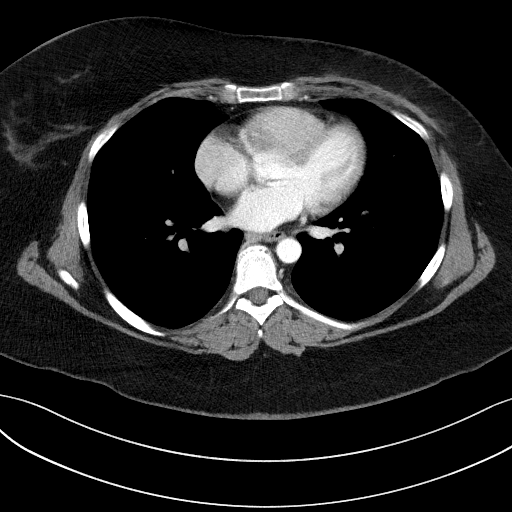
[im 111/134  mediastinal]
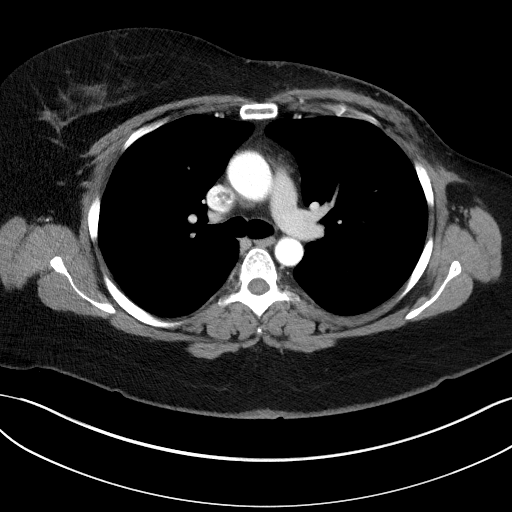
[im 111/134  bone]
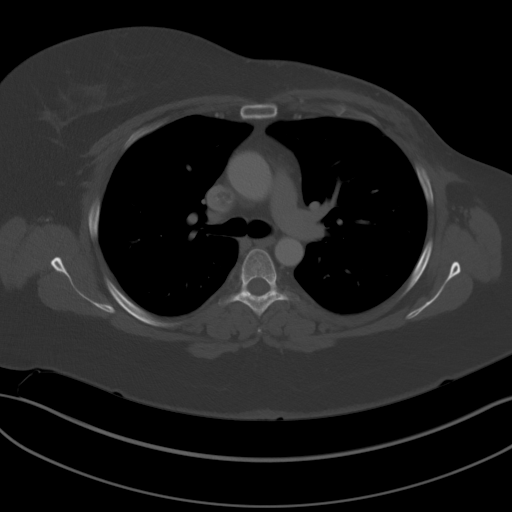
[im 122/134  mediastinal]
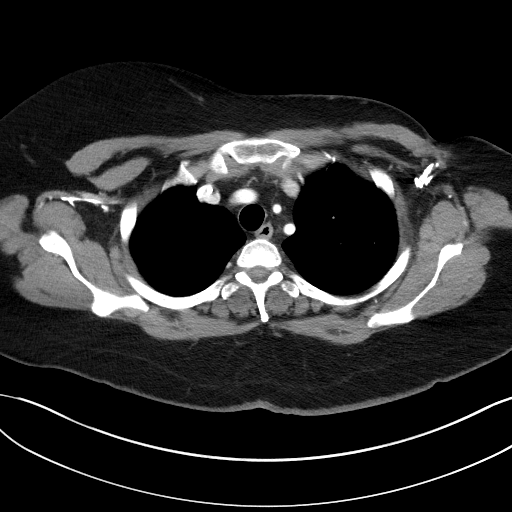

[Series 4: coronals · coronal · 0.82mm/px · 3 of 142 slices shown]
[im 29/142  mediastinal]
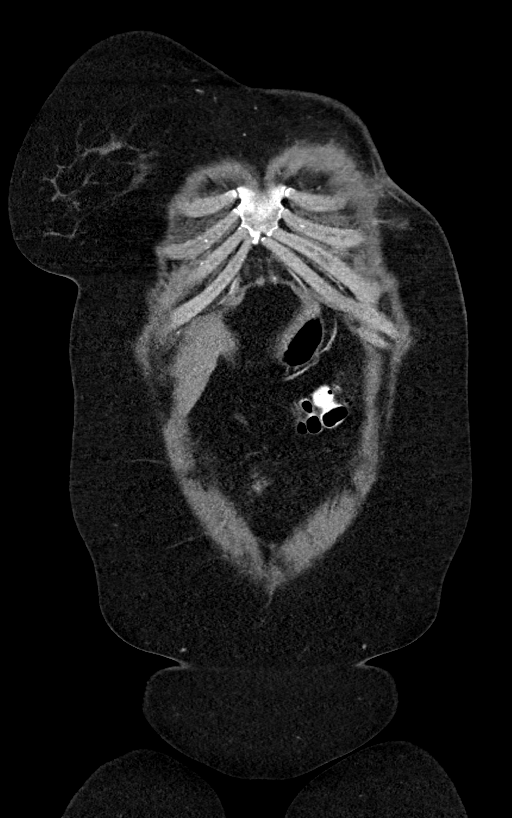
[im 57/142  mediastinal]
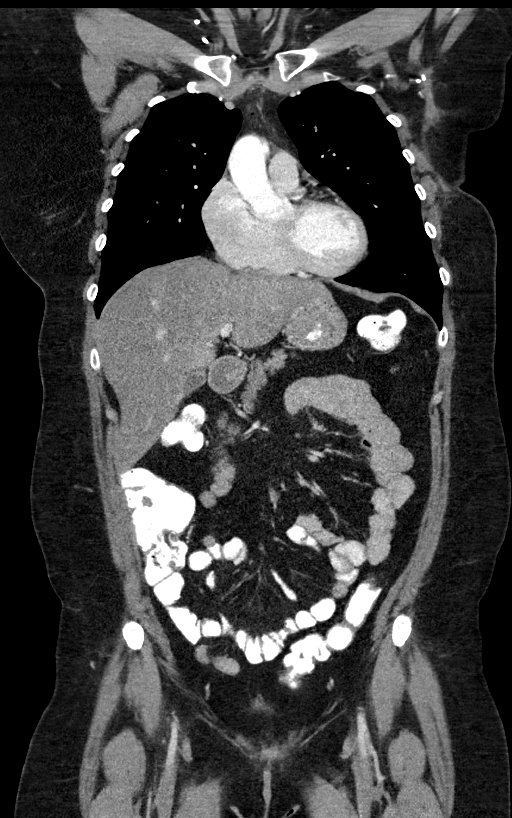
[im 85/142  mediastinal]
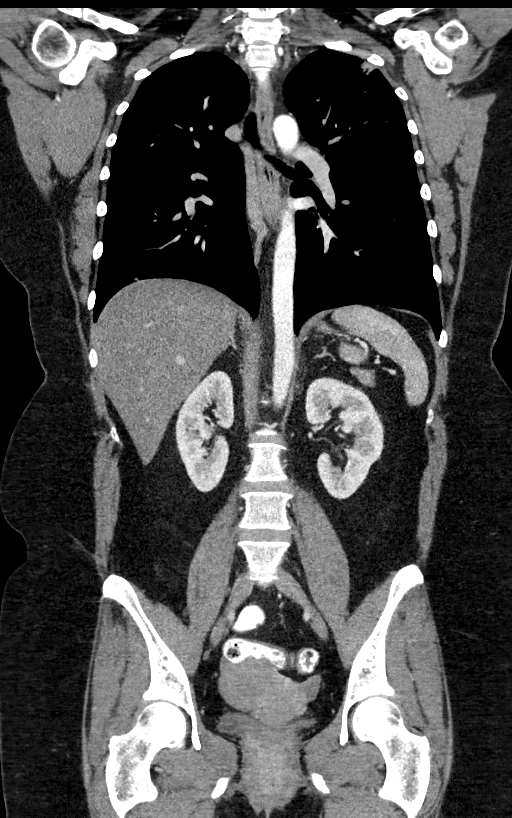

[13 of 36 positions shown; findings below may reference images not displayed]

FINDINGS: CT CHEST FINDINGS

Cardiovascular: Heart size is normal. There is no significant
pericardial fluid, thickening or pericardial calcification. No
atherosclerotic calcifications in the thoracic aorta or the coronary
arteries. Right-sided internal jugular single-lumen porta cath with
tip terminating in the mid superior vena cava.

Mediastinum/Nodes: No pathologically enlarged mediastinal, hilar or
internal mammary lymph nodes. Esophagus is unremarkable in
appearance. No axillary or subpectoral lymphadenopathy. Nonenlarged
lymph node in the lower left cervical region (axial image 4 of
series 2) measuring 7 mm in short axis, nonspecific.

Lungs/Pleura: New nodular area of architectural distortion in the
apex of the left upper lobe laterally just deep to the left axilla,
presumably evolving postradiation changes. No other suspicious
appearing pulmonary nodules or masses are noted. No acute
consolidative airspace disease. No pleural effusions.

Musculoskeletal: There are no aggressive appearing lytic or blastic
lesions noted in the visualized portions of the skeleton. Status
post left modified radical mastectomy and left axillary lymph node
dissection.

CT ABDOMEN PELVIS FINDINGS

Hepatobiliary: Diffuse low attenuation throughout the hepatic
parenchyma, indicative of hepatic steatosis. No suspicious cystic or
solid hepatic lesions. No intra or extrahepatic biliary ductal
dilatation. Gallbladder is unremarkable in appearance.

Pancreas: No pancreatic mass. No pancreatic ductal dilatation. No
pancreatic or peripancreatic fluid collections or inflammatory
changes.

Spleen: Unremarkable.

Adrenals/Urinary Tract: Bilateral kidneys and adrenal glands are
normal in appearance. No hydroureteronephrosis. Urinary bladder is
normal in appearance.

Stomach/Bowel: The appearance of the stomach is normal. There is no
pathologic dilatation of small bowel or colon. Normal appendix.

Vascular/Lymphatic: No significant atherosclerotic disease, aneurysm
or dissection noted in the abdominal or pelvic vasculature. No
lymphadenopathy noted in the abdomen or pelvis.

Reproductive: Uterus is again enlarged and heterogeneous in
appearance with multiple lesions, largest of which in the right-side
of the uterine fundus currently measures 5.2 x 4.7 cm. The more
exophytic lesion extending from the superior aspect of the fundus
appears to demonstrate greater enhancement than the previously
mentioned lesion, but has clearly decreased in size compared to the
prior study, currently measuring 5.3 x 3.5 cm (previously 8.9 x
cm). Ovaries are unremarkable in appearance.

Other: No significant volume of ascites.  No pneumoperitoneum.

Musculoskeletal: There are no aggressive appearing lytic or blastic
lesions noted in the visualized portions of the skeleton.
IMPRESSION: 1. Status post left modified radical mastectomy and left axillary
lymph node dissection, with no definitive findings to suggest
metastatic disease in the chest, abdomen or pelvis on today's
examination. Developing postradiation fibrosis in the apex of the
left upper lobe is new compared to the prior study, but typical in
the setting of prior axillary radiation therapy.
2. There appears to be 2 lesions in the uterus, one of which appears
slightly enlarged compared to the prior study, while the largest
lesion on the prior examination has partially regressed, as detailed
above.
3. Hepatic steatosis.

## 2021-03-28 MED ORDER — IOHEXOL 350 MG/ML SOLN
100.0000 mL | Freq: Once | INTRAVENOUS | Status: AC | PRN
  Administered 2021-03-28: 80 mL via INTRAVENOUS

## 2021-03-29 ENCOUNTER — Inpatient Hospital Stay: Payer: 59 | Admitting: Hematology

## 2021-03-29 ENCOUNTER — Inpatient Hospital Stay: Payer: 59

## 2021-03-30 ENCOUNTER — Inpatient Hospital Stay (HOSPITAL_BASED_OUTPATIENT_CLINIC_OR_DEPARTMENT_OTHER): Payer: 59 | Admitting: Hematology

## 2021-03-30 ENCOUNTER — Other Ambulatory Visit: Payer: Self-pay

## 2021-03-30 ENCOUNTER — Encounter: Payer: Self-pay | Admitting: Hematology

## 2021-03-30 ENCOUNTER — Telehealth: Payer: Self-pay | Admitting: Hematology

## 2021-03-30 ENCOUNTER — Inpatient Hospital Stay: Payer: 59

## 2021-03-30 VITALS — BP 107/86 | HR 92 | Temp 97.9°F | Resp 17 | Ht 64.0 in | Wt 228.1 lb

## 2021-03-30 DIAGNOSIS — Z171 Estrogen receptor negative status [ER-]: Secondary | ICD-10-CM

## 2021-03-30 DIAGNOSIS — Z1231 Encounter for screening mammogram for malignant neoplasm of breast: Secondary | ICD-10-CM | POA: Diagnosis not present

## 2021-03-30 DIAGNOSIS — C50112 Malignant neoplasm of central portion of left female breast: Secondary | ICD-10-CM | POA: Diagnosis not present

## 2021-03-30 DIAGNOSIS — Z95828 Presence of other vascular implants and grafts: Secondary | ICD-10-CM

## 2021-03-30 DIAGNOSIS — Z5111 Encounter for antineoplastic chemotherapy: Secondary | ICD-10-CM | POA: Diagnosis not present

## 2021-03-30 LAB — CMP (CANCER CENTER ONLY)
ALT: 30 U/L (ref 0–44)
AST: 19 U/L (ref 15–41)
Albumin: 3.5 g/dL (ref 3.5–5.0)
Alkaline Phosphatase: 181 U/L — ABNORMAL HIGH (ref 38–126)
Anion gap: 10 (ref 5–15)
BUN: 19 mg/dL (ref 6–20)
CO2: 25 mmol/L (ref 22–32)
Calcium: 9.6 mg/dL (ref 8.9–10.3)
Chloride: 104 mmol/L (ref 98–111)
Creatinine: 0.79 mg/dL (ref 0.44–1.00)
GFR, Estimated: >60 mL/min (ref >60)
Glucose, Bld: 93 mg/dL (ref 70–99)
Potassium: 4.3 mmol/L (ref 3.5–5.1)
Sodium: 139 mmol/L (ref 135–145)
Total Bilirubin: 0.4 mg/dL (ref 0.3–1.2)
Total Protein: 7 g/dL (ref 6.5–8.1)

## 2021-03-30 LAB — CBC WITH DIFFERENTIAL (CANCER CENTER ONLY)
Abs Immature Granulocytes: 0.05 10*3/uL (ref 0.00–0.07)
Basophils Absolute: 0 10*3/uL (ref 0.0–0.1)
Basophils Relative: 0 %
Eosinophils Absolute: 0.1 10*3/uL (ref 0.0–0.5)
Eosinophils Relative: 1 %
HCT: 33.8 % — ABNORMAL LOW (ref 36.0–46.0)
Hemoglobin: 11 g/dL — ABNORMAL LOW (ref 12.0–15.0)
Immature Granulocytes: 1 %
Lymphocytes Relative: 7 %
Lymphs Abs: 0.5 10*3/uL — ABNORMAL LOW (ref 0.7–4.0)
MCH: 31.3 pg (ref 26.0–34.0)
MCHC: 32.5 g/dL (ref 30.0–36.0)
MCV: 96 fL (ref 80.0–100.0)
Monocytes Absolute: 0.3 10*3/uL (ref 0.1–1.0)
Monocytes Relative: 4 %
Neutro Abs: 6.3 10*3/uL (ref 1.7–7.7)
Neutrophils Relative %: 87 %
Platelet Count: 256 10*3/uL (ref 150–400)
RBC: 3.52 MIL/uL — ABNORMAL LOW (ref 3.87–5.11)
RDW: 18.1 % — ABNORMAL HIGH (ref 11.5–15.5)
WBC Count: 7.3 10*3/uL (ref 4.0–10.5)
nRBC: 0 % (ref 0.0–0.2)

## 2021-03-30 MED ORDER — HEPARIN SOD (PORK) LOCK FLUSH 100 UNIT/ML IV SOLN
500.0000 [IU] | Freq: Once | INTRAVENOUS | Status: AC
  Administered 2021-03-30: 500 [IU]

## 2021-03-30 MED ORDER — SODIUM CHLORIDE 0.9% FLUSH
10.0000 mL | Freq: Once | INTRAVENOUS | Status: AC
  Administered 2021-03-30: 10 mL

## 2021-03-30 NOTE — Telephone Encounter (Signed)
Scheduled appt per 9/23 los - patient is aware of appt date and time - called and confirmed. Also faxed mammogram order to [facility]. Patient is aware she should call to schedule

## 2021-03-30 NOTE — Progress Notes (Signed)
[place]   [phone] [phone]   Clinic Follow up Note   Patient Care Team: [name], MD as PCP - General (Family Medicine) Mauro Kaufmann, RN as Oncology Nurse Navigator Rockwell Germany, RN as Oncology Nurse Navigator [name], MD as Consulting Physician (General Surgery) Truitt Merle, MD as Consulting Physician (Hematology) Gery Pray, MD as Consulting Physician (Radiation Oncology)  Date of Service:  03/30/2021  CHIEF COMPLAINT: f/u of left breast cancer  CURRENT THERAPY:  Restart Xeloda 2 weeks on/1 week off on 04/02/21  ASSESSMENT & PLAN:  Caitlyn Hendricks is a 40 y.o. female with   1. Cutaneous Lupus -She had several episodes of rash since she started chemo treatment, she had a skin biopsy which showed a drug-related dermatitis.   -She has residual dermatitis s/p radiation therapy, which she completed 12/11/20. The moist desquamation has resolved. -Since restarting Xeloda on 01/01/21, she again developed a rash across her upper chest, back, and arms. The Claritin, famotidine and Singulair helped to make the rash more tolerable than previously.  -She was switched to 5FU on 01/18/21. The rash on her chest and left shoulder improved some but persist  -she underwent lab work with her dermatologist on 02/27/21. Results revealed cutaneous Lupus. She is now on medication for this. -since being on the medication for Lupus, her rash has greatly improved. -we discussed that this is probably induced by Keytruda    2. 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 Mammogram/US on 02/23/20 showed 2 adjacent left breast masses at 12 o'clock spanning 3.6cm and multiple enlarged left axillary (at least 5) and left subpectoral LNs. Her 03/08/20 biopsy revealed grade III invasive ductal carcinoma,  triple negative, metastatic to her left axillary LN. CT CAP/Bone scan negative for distant mets.  -She completed neoadjuvant chemo with ddAC q2weeks for 4 cycles 03/22/20-05/03/20. Followed by weekly carbo/taxol for 12 weeks 05/17/20-08/16/20 before proceeding with surgery.  -she started [facility] for 1 year treatment on 04/28/20, but stopped in 10/2020 due to skin rash and arthralgia.   -She underwent left mastectomy with Dr Donne Hazel on 09/20/20. Surgical path shows No residual invasive carcinoma in breast status post neoadjuvant treatment, however has 7/19 positive LN.  -She started oral Xeloda on 10/30/20 but held starting 11/20/20 due to skin rash.  -She received adjuvant radiation with Dr Sondra Come from 10/30/20 through 12/11/20.  -She restarted Xeloda on 01/01/21 and again developed a rash, as well as joint pain. -We switched her treatment to 5-FU on 01/18/21 and added 5-FU bolus and leucovorin with cycle 2. She received for 4 cycles. -We are switching her back to Xeloda, per patient preference and since her rash is now under control. We reviewed how to take the medicine, 2 weeks on/1 week off. -I ordered mammogram to be done soon.   3. Anxiety  -She has a history of anxiety and has been on Xanax 52m TID and Celexa 462m -she is on low dose Ambien for sleep.  -She has very good social support from mother and boyfriend. 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 -She has developed worsening anxiety lately, due to stress from cancer diagnosis, skin rash, not able to go out due to rash etc.  -She feels that she has had adequate counseling, and that talking about it  makes it worse. -We previously provided her with information about the [place] in [facility] and encouraged her to call for appointment.    4. Hot flashes -Secondary to chemo-induced perimenopause -Managed with gabapentin and celexa -Last period 03/2020 prior to starting chemo until she  had another period 01/27/21   5. Genetic Testing negative for pathogenetic mutations with VUS of POLE gene   6. COVID (+) on 08/02/20 with symptoms of Hoarseness and ribcage pain. She has recovered quickly and completely       PLAN: -restart Xeloda on 04/02/21 at 1571m q12h for day 1-14 every 21 days -right mammogram to be done soon -lab and f/u in 3 weeks, plan to increase Xeloda dose on next cycle if she tolerates well    No problem-specific Assessment & Plan notes found for this encounter.   SUMMARY OF ONCOLOGIC HISTORY: Oncology History Overview Note  Cancer Staging Cancer of central portion of left female breast ([facility] Staging form: Breast, AJCC 8th Edition - Clinical stage from 03/08/2020: Stage IIIB (cT2, cN1, cM0, G3, ER-, PR-, HER2-) - Signed by FTruitt Merle MD on 03/10/2020 Stage prefix: Initial diagnosis Histologic grading system: 3 grade system - Pathologic stage from 09/20/2020: No Stage Recommended (ypT0, pN2a, cM0, G3, ER-, PR-, HER2-) - Signed by CGardenia Phlegm NP on 10/04/2020 Stage prefix: Post-therapy Histologic grading system: 3 grade system    Cancer of central portion of left female breast ([place]  02/23/2020 Mammogram   Diagnostic Mammogram 02/23/20  IMPRESSION The 2x1x2.6cm irregular mass in teh left breast at 12:00 posiiton middle depth is highly suspicious of malignancy. An [place] recommended for further evaluation and biopsy planning purposes.   02/23/2020 Breast [place]  [place] breast 02/23/20  IMPRESSION 2 adjacent spiculated masses in the left brast at 12:00 position 3 cm from the nipple (2.1x0.9x1.1cm and 1.1x1.4x0.5cm) is suggestive of malignancy.   Multiple abnormal left subpectoral and left axillary nodes measuring 3.3x2.1 cm concerning for metastatic adenopathy.    Left breast skin thickening and edema may be secondary congestive edema due to extensive axillary adenopathy.    03/08/2020 Initial Biopsy   Diagnosis 1.Breast, left, needle core  biopsy, 12:00 position, 3cmfn -INVASIVE DUCTAL CARCINOMA -SEE COMMENT  2. Lymph node, needle/core biopsy, left axilla -METASTATIC CARCINOMA INVOLVING A LYMPH NODE  -LYMPHOVASCULAR SPACE INVASION PRESENT   Microscopic Comment  1.Based on the biopsy the carcinoma appears Nottingham Grade 3 or 3 and measures 1 cm in the greatest linear extent.    03/08/2020 Receptors her2   ER- Negative 0% PR - Negative 0% HER2 - Negative  KI 67 - 80%    03/08/2020 Cancer Staging   Staging form: Breast, AJCC 8th Edition - Clinical stage from 03/08/2020: Stage IIIB (cT2, cN1, cM0, G3, ER-, PR-, HER2-) - Signed by FTruitt Merle MD on 03/10/2020   03/10/2020 Initial Diagnosis   Cancer of central portion of left female breast ([place]   03/16/2020 Breast MRI   IMPRESSION: 1. 8.1 x 7.8 x 6.6 cm biopsy proven invasive ductal carcinoma in the central right breast, involving 3 quadrants. 2. 3.0 x 1.7 x 1.1 cm satellite mass more inferiorly in lower inner quadrant of the left breast, compatible with additional malignancy. 3. Metastatic level 1 and level 2 left axillary lymph nodes. 4. No evidence of malignancy on the right.   03/17/2020 Imaging   IMPRESSION: CT CAP w contrast  1. Diffuse skin thickening in the left breast with left axillary and subpectoral lymphadenopathy, as  well as a mildly enlarged left supraclavicular lymph node, which likely represents metastatic lymphadenopathy. No other definite extra nodal metastatic disease noted elsewhere in the chest, abdomen or pelvis. 2. Large mass in the central anatomic pelvis which is of uncertain origin, potentially a large exophytic fibroid or a large solid mass arising from the right ovary. Further evaluation with pelvic ultrasound is strongly recommended.   03/21/2020 Imaging   Bone Scan  IMPRESSION: Apparent arthropathy at L5. No bony metastatic disease is demonstrable on this study. Scattered foci of abnormal uptake in a pelvic mass is of uncertain etiology  given absence of calcification in this mass by CT. This mass compresses the urinary bladder. It is possible that some of the increased uptake in this area actually represents physiologic uptake within the bladder.   Kidneys noted in flank positions bilaterally.     03/22/2020 - 08/16/2020 Neo-Adjuvant Chemotherapy   Neoadjuvant Adriamycin and Cytoxan q2weeks for 4 cycles starting 03/22/20-05/03/20 followed by weekly Taxol and Carboplatin for 12 weeks starting 05/17/20-08/16/20   03/24/2020 Imaging   US Pelvis  IMPRESSION: 1. Large pedunculated lesion directly contiguous with the uterine most suggestive of a large subserosal fibroid which measures up to 8.2 cm. 2. Additional 1 cm probable intramural fibroid in the right anterior uterine body. 3. No other acute or worrisome pelvic abnormality.   03/29/2020 Genetic Testing   Negative genetic testing on the common hereditary cancer panel.  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  The Common Hereditary Gene Panel offered by [facility] includes sequencing and/or deletion duplication testing of the following 47 genes: APC, ATM, AXIN2, BARD1, BMPR1A, BRCA1, BRCA2, BRIP1, CDH1, CDK4, CDKN2A (p14ARF), CDKN2A (p16INK4a), CHEK2, CTNNA1, DICER1, EPCAM (Deletion/duplication testing only), GREM1 (promoter region deletion/duplication testing only), KIT, MEN1, MLH1, MSH2, MSH3, MSH6, MUTYH, NBN, NF1, NHTL1, PALB2, PDGFRA, PMS2, POLD1, POLE, PTEN, RAD50, RAD51C, RAD51D, SDHB, SDHC, SDHD, SMAD4, SMARCA4. STK11, TP53, TSC1, TSC2, and VHL.  The following genes were evaluated for sequence changes only: SDHA and HOXB13 c.251G>A variant only. The report date is 03/29/2020   04/28/2020 - 10/27/2020 Antibody Plan   Added Keytruda q3weeks starting 04/28/20 to complete 1 year of treatment. Held since 10/27/20 due to skin rash and body aches.    08/17/2020 Breast MRI   IMPRESSION: 1. The biopsy proven malignancy in the left breast and the adjacent satellite lesion have resolved in  the interval. No abnormalities in these locations today. 2. No MRI evidence of malignancy in the right breast. 3. No adenopathy identified today   09/20/2020 Surgery   LEFT MODIFIED RADICAL MASTECTOMY by Dr Donne Hazel   09/20/2020 Pathology Results   FINAL MICROSCOPIC DIAGNOSIS:   A. BREAST, LEFT, MODIFIED RADICAL MASTECTOMY:  - No residual invasive carcinoma in breast status post neoadjuvant  treatment.  - Metastatic carcinoma in (2) of (14) lymph nodes.  - Biopsy sites (one in breast, one in one of the positive lymph nodes).  - See oncology table.   B. ADDITIONAL AXILLARY CONTENTS, LEFT, DISSECTION:  - Metastatic carcinoma in (5) of (5) lymph nodes.    09/20/2020 Cancer Staging   Staging form: Breast, AJCC 8th Edition - Pathologic stage from 09/20/2020: No Stage Recommended (ypT0, pN2a, cM0, G3, ER-, PR-, HER2-) - Signed by Gardenia Phlegm, NP on 10/04/2020 Stage prefix: Post-therapy Histologic grading system: 3 grade system   10/30/2020 - 12/11/2020 Radiation Therapy   Adjuvant Radiation with Dr Sondra Come starting 10/30/20   10/30/2020 -  Chemotherapy   Adjuvant Xeloda starting 10/30/20 at 1559m  BID M-F while on RT. Held since 11/20/2020 due to recurrent skin rash   01/18/2021 -  Chemotherapy    Patient is on Treatment Plan: COLORECTAL 5FU / LEUCOVORIN MODIFIED DEGRAMONT Q14D X 12 CYCLES       03/28/2021 Imaging   CT CAP  IMPRESSION: 1. Status post left modified radical mastectomy and left axillary lymph node dissection, with no definitive findings to suggest metastatic disease in the chest, abdomen or pelvis on today's examination. Developing postradiation fibrosis in the apex of the left upper lobe is new compared to the prior study, but typical in the setting of prior axillary radiation therapy. 2. There appears to be 2 lesions in the uterus, one of which appears slightly enlarged compared to the prior study, while the largest lesion on the prior examination has  partially regressed, as detailed above. 3. Hepatic steatosis.      INTERVAL HISTORY:  Caitlyn Hendricks is here for a follow up of breast cancer. She was last seen by me on 03/15/21. She presents to the clinic accompanied by her mother. She reports her rash is vastly improving. She had questions today regarding surveillance scans. She has a small nodule in her axilla that is not concerning on CT; she wonders if she can watch this with [place]. She asked about bone scan, which I don't feel is necessary in her case. She also reports pain to her left wrist, "feels like it's bruised."   All other systems were reviewed with the patient and are negative.  MEDICAL HISTORY:  Past Medical History:  Diagnosis Date   Anemia    Anxiety    Arthritis    Cancer ([place])    Left Breast   Depression    GERD (gastroesophageal reflux disease)    Headache    History of radiation therapy 10/30/20-12/11/20   left chest wall and axilla - Dr. Gery Pray   Panic attack     SURGICAL HISTORY: Past Surgical History:  Procedure Laterality Date   MASTECTOMY MODIFIED RADICAL Left 09/20/2020   Procedure: LEFT MODIFIED RADICAL MASTECTOMY;  Surgeon: [name], MD;  Location: [facility];  Service: General;  Laterality: Left;  RNFA; PEC BLOCK;   PORTACATH PLACEMENT Right 03/21/2020   Procedure: INSERTION PORT-A-CATH WITH ULTRASOUND GUIDANCE;  Surgeon: [name], MD;  Location: [facility];  Service: General;  Laterality: Right;    I have reviewed the social history and family history with the patient and they are unchanged from previous note.  ALLERGIES:  is allergic to medroxyprogesterone and xeloda [capecitabine].  MEDICATIONS:  Current Outpatient Medications  Medication Sig Dispense Refill   ALPRAZolam (XANAX) 1 MG tablet Take 1 mg by mouth 3 (three) times daily as needed for anxiety.      baclofen (LIORESAL) 10 MG tablet Take 1 tablet (10 mg total) by mouth 2 (two) times daily as needed  for muscle spasms. 30 each 0   citalopram (CELEXA) 20 MG tablet Take 20 mg by mouth daily.     diphenhydrAMINE (BENADRYL) 25 mg capsule Take 25 mg by mouth every 6 (six) hours as needed for allergies.     diphenhydrAMINE HCl (ZZZQUIL) 50 MG/30ML LIQD Take 30 mLs by mouth at bedtime as needed (sleep).     famotidine (PEPCID) 20 MG tablet TAKE 1 TABLET BY MOUTH TWICE A DAY 60 tablet 1   fluticasone (FLONASE) 50 MCG/ACT nasal spray Place 2 sprays into both nostrils daily as needed for allergies or rhinitis.  gabapentin (NEURONTIN) 100 MG capsule Take 1 capsule (100 mg total) by mouth 3 (three) times daily as needed (hot flashes). May increase to 330m three times daily as need in a few weeks if tolerates well 90 capsule 1   ibuprofen (ADVIL) 200 MG tablet Take 800 mg by mouth every 8 (eight) hours as needed for moderate pain.     montelukast (SINGULAIR) 10 MG tablet TAKE 1 TABLET BY MOUTH EVERYDAY AT BEDTIME 30 tablet 0   triamcinolone ointment (KENALOG) 0.5 % Apply 1 application topically 2 (two) times daily. 30 g 2   No current facility-administered medications for this visit.   Facility-Administered Medications Ordered in Other Visits  Medication Dose Route Frequency Provider Last Rate Last Admin   sodium chloride flush (NS) 0.9 % injection 10 mL  10 mL Intravenous PRN BAlla Feeling NP   10 mL at 03/28/20 1104    PHYSICAL EXAMINATION: ECOG PERFORMANCE STATUS: 1 - Symptomatic but completely ambulatory  Vitals:   03/30/21 0822  BP: 107/86  Pulse: 92  Resp: 17  Temp: 97.9 F (36.6 C)  SpO2: 100%   Wt Readings from Last 3 Encounters:  03/30/21 228 lb 1.6 oz (103.5 kg)  03/15/21 224 lb 12.8 oz (102 kg)  03/01/21 228 lb 4.8 oz (103.6 kg)     GENERAL:alert, no distress and comfortable SKIN: skin color normal, no rashes or significant lesions EYES: normal, Conjunctiva are pink and non-injected, sclera clear  NEURO: alert & oriented x 3 with fluent speech  LABORATORY DATA:  I  have reviewed the data as listed CBC Latest Ref Rng & Units 03/30/2021 03/15/2021 03/01/2021  WBC 4.0 - 10.5 K/uL 7.3 9.7 1.0(L)  Hemoglobin 12.0 - 15.0 g/dL 11.0(L) 12.2 10.8(L)  Hematocrit 36.0 - 46.0 % 33.8(L) 37.0 33.2(L)  Platelets 150 - 400 K/uL 256 205 142(L)     CMP Latest Ref Rng & Units 03/30/2021 03/15/2021 03/01/2021  Glucose 70 - 99 mg/dL 93 85 103(H)  BUN 6 - 20 mg/dL _0 Creatinine 0.44 - 1.00 mg/dL 0.79 0.86 0.93  Sodium 135 - 145 mmol/L 139 138 138  Potassium 3.5 - 5.1 mmol/L 4.3 4.3 3.7  Chloride 98 - 111 mmol/L 104 103 105  CO2 22 - 32 mmol/L _1 Calcium 8.9 - 10.3 mg/dL 9.6 9.4 8.6(L)  Total Protein 6.5 - 8.1 g/dL 7.0 7.9 7.0  Total Bilirubin 0.3 - 1.2 mg/dL 0.4 1.2 0.5  Alkaline Phos 38 - 126 U/L 181(H) 193(H) 211(H)  AST 15 - 41 U/L 19 48(H) 30  ALT 0 - 44 U/L 30 53(H) 39      RADIOGRAPHIC STUDIES: I have personally reviewed the radiological images as listed and agreed with the findings in the report. No results found.    Orders Placed This Encounter  Procedures   MM Digital Screening Unilat R    [place]    Standing Status:   Future    Standing Expiration Date:   03/30/2022    Order Specific Question:   Reason for Exam (SYMPTOM  OR DIAGNOSIS REQUIRED)    Answer:   screening    Order Specific Question:   Is the patient pregnant?    Answer:   No    Order Specific Question:   Preferred imaging location?    Answer:   External   All questions were answered. The patient knows to call the clinic with any problems, questions or concerns. No barriers to learning was detected. The  total time spent in the appointment was 30 minutes.     Truitt Merle, MD 03/30/2021   I, Wilburn Mylar, am acting as scribe for Truitt Merle, MD.   I have reviewed the above documentation for accuracy and completeness, and I agree with the above.

## 2021-04-10 ENCOUNTER — Other Ambulatory Visit: Payer: Self-pay | Admitting: Hematology

## 2021-04-11 ENCOUNTER — Other Ambulatory Visit: Payer: Self-pay

## 2021-04-11 MED ORDER — CAPECITABINE 500 MG PO TABS: 1500.0000 mg | ORAL_TABLET | Freq: Two times a day (BID) | ORAL | 1 refills | Status: DC

## 2021-04-11 NOTE — Addendum Note (Signed)
Addended by: Estella Husk on: 04/11/2021 04:46 PM   Modules accepted: Orders

## 2021-04-17 ENCOUNTER — Encounter: Payer: Self-pay | Admitting: Hematology

## 2021-04-20 ENCOUNTER — Inpatient Hospital Stay: Payer: 59

## 2021-04-20 ENCOUNTER — Inpatient Hospital Stay: Payer: 59 | Attending: Hematology | Admitting: Hematology

## 2021-05-07 ENCOUNTER — Other Ambulatory Visit: Payer: Self-pay

## 2021-05-07 DIAGNOSIS — C50112 Malignant neoplasm of central portion of left female breast: Secondary | ICD-10-CM

## 2021-05-07 DIAGNOSIS — Z171 Estrogen receptor negative status [ER-]: Secondary | ICD-10-CM

## 2021-05-08 ENCOUNTER — Other Ambulatory Visit: Payer: Self-pay | Admitting: Hematology

## 2021-05-08 ENCOUNTER — Encounter: Payer: Self-pay | Admitting: Hematology

## 2021-05-08 ENCOUNTER — Other Ambulatory Visit: Payer: Self-pay

## 2021-05-08 ENCOUNTER — Inpatient Hospital Stay: Payer: 59

## 2021-05-08 ENCOUNTER — Inpatient Hospital Stay: Payer: 59 | Attending: Hematology | Admitting: Hematology

## 2021-05-08 VITALS — BP 106/80 | HR 87 | Temp 98.5°F | Resp 18 | Wt 232.4 lb

## 2021-05-08 DIAGNOSIS — Z9012 Acquired absence of left breast and nipple: Secondary | ICD-10-CM | POA: Diagnosis not present

## 2021-05-08 DIAGNOSIS — Z923 Personal history of irradiation: Secondary | ICD-10-CM | POA: Diagnosis not present

## 2021-05-08 DIAGNOSIS — L930 Discoid lupus erythematosus: Secondary | ICD-10-CM | POA: Diagnosis not present

## 2021-05-08 DIAGNOSIS — Z171 Estrogen receptor negative status [ER-]: Secondary | ICD-10-CM | POA: Insufficient documentation

## 2021-05-08 DIAGNOSIS — C773 Secondary and unspecified malignant neoplasm of axilla and upper limb lymph nodes: Secondary | ICD-10-CM | POA: Insufficient documentation

## 2021-05-08 DIAGNOSIS — F419 Anxiety disorder, unspecified: Secondary | ICD-10-CM | POA: Diagnosis not present

## 2021-05-08 DIAGNOSIS — Z8616 Personal history of COVID-19: Secondary | ICD-10-CM | POA: Insufficient documentation

## 2021-05-08 DIAGNOSIS — C50112 Malignant neoplasm of central portion of left female breast: Secondary | ICD-10-CM

## 2021-05-08 DIAGNOSIS — Z95828 Presence of other vascular implants and grafts: Secondary | ICD-10-CM

## 2021-05-08 DIAGNOSIS — K219 Gastro-esophageal reflux disease without esophagitis: Secondary | ICD-10-CM | POA: Insufficient documentation

## 2021-05-08 DIAGNOSIS — R232 Flushing: Secondary | ICD-10-CM | POA: Diagnosis not present

## 2021-05-08 DIAGNOSIS — Z9221 Personal history of antineoplastic chemotherapy: Secondary | ICD-10-CM | POA: Diagnosis not present

## 2021-05-08 LAB — CBC WITH DIFFERENTIAL/PLATELET
Abs Immature Granulocytes: 0.01 10*3/uL (ref 0.00–0.07)
Basophils Absolute: 0 10*3/uL (ref 0.0–0.1)
Basophils Relative: 0 %
Eosinophils Absolute: 0.1 10*3/uL (ref 0.0–0.5)
Eosinophils Relative: 1 %
HCT: 35.3 % — ABNORMAL LOW (ref 36.0–46.0)
Hemoglobin: 11.4 g/dL — ABNORMAL LOW (ref 12.0–15.0)
Immature Granulocytes: 0 %
Lymphocytes Relative: 11 %
Lymphs Abs: 0.6 10*3/uL — ABNORMAL LOW (ref 0.7–4.0)
MCH: 32.1 pg (ref 26.0–34.0)
MCHC: 32.3 g/dL (ref 30.0–36.0)
MCV: 99.4 fL (ref 80.0–100.0)
Monocytes Absolute: 0.3 10*3/uL (ref 0.1–1.0)
Monocytes Relative: 6 %
Neutro Abs: 4.3 10*3/uL (ref 1.7–7.7)
Neutrophils Relative %: 82 %
Platelets: 251 10*3/uL (ref 150–400)
RBC: 3.55 MIL/uL — ABNORMAL LOW (ref 3.87–5.11)
RDW: 17.3 % — ABNORMAL HIGH (ref 11.5–15.5)
WBC: 5.4 10*3/uL (ref 4.0–10.5)
nRBC: 0 % (ref 0.0–0.2)

## 2021-05-08 LAB — COMPREHENSIVE METABOLIC PANEL
ALT: 17 U/L (ref 0–44)
AST: 14 U/L — ABNORMAL LOW (ref 15–41)
Albumin: 3.5 g/dL (ref 3.5–5.0)
Alkaline Phosphatase: 150 U/L — ABNORMAL HIGH (ref 38–126)
Anion gap: 7 (ref 5–15)
BUN: 13 mg/dL (ref 6–20)
CO2: 26 mmol/L (ref 22–32)
Calcium: 9 mg/dL (ref 8.9–10.3)
Chloride: 105 mmol/L (ref 98–111)
Creatinine, Ser: 0.76 mg/dL (ref 0.44–1.00)
GFR, Estimated: >60 mL/min (ref >60)
Glucose, Bld: 89 mg/dL (ref 70–99)
Potassium: 4.1 mmol/L (ref 3.5–5.1)
Sodium: 138 mmol/L (ref 135–145)
Total Bilirubin: 0.8 mg/dL (ref 0.3–1.2)
Total Protein: 6.9 g/dL (ref 6.5–8.1)

## 2021-05-08 MED ORDER — SODIUM CHLORIDE 0.9% FLUSH
10.0000 mL | Freq: Once | INTRAVENOUS | Status: AC
  Administered 2021-05-08: 10 mL

## 2021-05-08 MED ORDER — HEPARIN SOD (PORK) LOCK FLUSH 100 UNIT/ML IV SOLN
500.0000 [IU] | Freq: Once | INTRAVENOUS | Status: AC
  Administered 2021-05-08: 500 [IU]

## 2021-05-08 NOTE — Progress Notes (Signed)
[place]   [phone]) [phone] Fax:(336) [phone]   Clinic Follow up Note   Patient Care Team: [name], MD as PCP - General (Family Medicine) Mauro Kaufmann, RN as Oncology Nurse Navigator Rockwell Germany, RN as Oncology Nurse Navigator [name], MD as Consulting Physician (General Surgery) Truitt Merle, MD as Consulting Physician (Hematology) Gery Pray, MD as Consulting Physician (Radiation Oncology)  Date of Service:  05/08/2021  CHIEF COMPLAINT: f/u of left breast cancer  CURRENT THERAPY:  Restart Xeloda 2 weeks on/1 week off on 04/02/21  ASSESSMENT & PLAN:  Caitlyn Hendricks is a 40 y.o. female with   1. 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 Mammogram/US on 02/23/20 showed 2 adjacent left breast masses at 12 o'clock spanning 3.6cm and multiple enlarged left axillary (at least 5) and left subpectoral LNs. Her 03/08/20 biopsy revealed grade III invasive ductal carcinoma, triple negative, metastatic to her left axillary LN. CT CAP/Bone scan negative for distant mets.  -She completed neoadjuvant chemo with ddAC q2weeks for 4 cycles 03/22/20-05/03/20. Followed by weekly carbo/taxol for 12 weeks 05/17/20-08/16/20 before proceeding with surgery.  -she started [facility] [place] for 1 year treatment on 04/28/20, but stopped in 10/2020 due to skin rash and arthralgia.   -She underwent left mastectomy with Dr Donne Hazel on 09/20/20. Surgical path shows No residual invasive carcinoma in breast status post neoadjuvant treatment, however has 7/19 positive LN.  -She started oral Xeloda on 10/30/20 but held starting 11/20/20 due to skin rash.  -She received adjuvant radiation with Dr Sondra Come from 10/30/20 through 12/11/20.  -She restarted Xeloda on 01/01/21 and again developed a rash, as well as joint pain. -We  switched her treatment to 5-FU on 01/18/21 and added 5-FU bolus and leucovorin with cycle 2. She received for 4 cycles. -5-fu was changed back to Xeloda, per patient preference and since her rash is now under control. She has tolerated well so far since being put on the Lupus medication. We will continue her current dose for now. -He has 1 more cycle of Xeloda to complete by the end of this month, total 6 months treatment. -We discussed breast cancer surveillance, she will continue mammogram once a year, and routine office visit every 3 to 4 months then every 6 months until 5 years. -we will plan for repeat CT CAP in 6-7 weeks.  2. Cutaneous Lupus -She had several episodes of rash since she started chemo treatment, she had a skin biopsy which showed a drug-related dermatitis.   -She has residual dermatitis s/p radiation therapy, which she completed 12/11/20. The moist desquamation has resolved. -Since restarting Xeloda on 01/01/21, she again developed a rash across her upper chest, back, and arms. The Claritin, famotidine and Singulair helped to make the rash more tolerable than previously.  -She was switched to 5FU on 01/18/21. The rash on her chest and left shoulder improved some but persist  -she underwent lab work with her dermatologist on 02/27/21. Results revealed cutaneous Lupus. She is now on medication for this. -since being on the medication for Lupus, her rash has greatly improved. -we discussed that this is probably induced by Keytruda    3. Anxiety  -She has a history of anxiety and has been on Xanax 46m TID and Celexa 426m -she is on low dose Ambien for sleep.  -She has very good  social support from mother and boyfriend. 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 -She has developed worsening anxiety lately, due to stress from cancer diagnosis, skin rash, not able to go out due to rash etc.  -She feels that she has had adequate counseling, and that  talking about it makes it worse. -We previously provided her with information about the [place] in [facility] and encouraged her to call for appointment.    4. Hot flashes -Secondary to chemo-induced perimenopause -Managed with gabapentin and celexa -Last period 03/2020 prior to starting chemo until she had another period 01/27/21   5. Genetic Testing negative for pathogenetic mutations with VUS of POLE gene   6. COVID (+) on 08/02/20 with symptoms of Hoarseness and ribcage pain. She has recovered quickly and completely       PLAN: -continue Xeloda for one more cycle  -lab and CT CAP in 6-7 weeks with f/u several days after.   No problem-specific Assessment & Plan notes found for this encounter.   SUMMARY OF ONCOLOGIC HISTORY: Oncology History Overview Note  Cancer Staging Cancer of central portion of left female breast [facility]) Staging form: Breast, AJCC 8th Edition - Clinical stage from 03/08/2020: Stage IIIB (cT2, cN1, cM0, G3, ER-, PR-, HER2-) - Signed by Truitt Merle, MD on 03/10/2020 Stage prefix: Initial diagnosis Histologic grading system: 3 grade system - Pathologic stage from 09/20/2020: No Stage Recommended (ypT0, pN2a, cM0, G3, ER-, PR-, HER2-) - Signed by Gardenia Phlegm, NP on 10/04/2020 Stage prefix: Post-therapy Histologic grading system: 3 grade system    Cancer of central portion of left female breast ([place])  02/23/2020 Mammogram   Diagnostic Mammogram 02/23/20  IMPRESSION The 2x1x2.6cm irregular mass in teh left breast at 12:00 posiiton middle depth is highly suspicious of malignancy. An [place] is recommended for further evaluation and biopsy planning purposes.   02/23/2020 Breast US   [name] 02/23/20  IMPRESSION 2 adjacent spiculated masses in the left brast at 12:00 position 3 cm from the nipple (2.1x0.9x1.1cm and 1.1x1.4x0.5cm) is suggestive of malignancy.   Multiple abnormal left subpectoral and left axillary nodes measuring 3.3x2.1 cm  concerning for metastatic adenopathy.    Left breast skin thickening and edema may be secondary congestive edema due to extensive axillary adenopathy.    03/08/2020 Initial Biopsy   Diagnosis 1.Breast, left, needle core biopsy, 12:00 position, 3cmfn -INVASIVE DUCTAL CARCINOMA -SEE COMMENT  2. Lymph node, needle/core biopsy, left axilla -METASTATIC CARCINOMA INVOLVING A LYMPH NODE  -LYMPHOVASCULAR SPACE INVASION PRESENT   Microscopic Comment  1.Based on the biopsy the carcinoma appears Nottingham Grade 3 or 3 and measures 1 cm in the greatest linear extent.    03/08/2020 Receptors her2   ER- Negative 0% PR - Negative 0% HER2 - Negative  KI 67 - 80%    03/08/2020 Cancer Staging   Staging form: Breast, AJCC 8th Edition - Clinical stage from 03/08/2020: Stage IIIB (cT2, cN1, cM0, G3, ER-, PR-, HER2-) - Signed by Truitt Merle, MD on 03/10/2020    03/10/2020 Initial Diagnosis   Cancer of central portion of left female breast ([place])   03/16/2020 Breast MRI   IMPRESSION: 1. 8.1 x 7.8 x 6.6 cm biopsy proven invasive ductal carcinoma in the central right breast, involving 3 quadrants. 2. 3.0 x 1.7 x 1.1 cm satellite mass more inferiorly in lower inner quadrant of the left breast, compatible with additional malignancy. 3. Metastatic level 1 and level 2  left axillary lymph nodes. 4. No evidence of malignancy on the right.   03/17/2020 Imaging   IMPRESSION: CT CAP w contrast  1. Diffuse skin thickening in the left breast with left axillary and subpectoral lymphadenopathy, as well as a mildly enlarged left supraclavicular lymph node, which likely represents metastatic lymphadenopathy. No other definite extra nodal metastatic disease noted elsewhere in the chest, abdomen or pelvis. 2. Large mass in the central anatomic pelvis which is of uncertain origin, potentially a large exophytic fibroid or a large solid mass arising from the right ovary. Further evaluation with pelvic ultrasound is strongly  recommended.   03/21/2020 Imaging   Bone Scan  IMPRESSION: Apparent arthropathy at L5. No bony metastatic disease is demonstrable on this study. Scattered foci of abnormal uptake in a pelvic mass is of uncertain etiology given absence of calcification in this mass by CT. This mass compresses the urinary bladder. It is possible that some of the increased uptake in this area actually represents physiologic uptake within the bladder.   Kidneys noted in flank positions bilaterally.     03/22/2020 - 08/16/2020 Neo-Adjuvant Chemotherapy   Neoadjuvant Adriamycin and Cytoxan q2weeks for 4 cycles starting 03/22/20-05/03/20 followed by weekly Taxol and Carboplatin for 12 weeks starting 05/17/20-08/16/20   03/24/2020 Imaging   US Pelvis  IMPRESSION: 1. Large pedunculated lesion directly contiguous with the uterine most suggestive of a large subserosal fibroid which measures up to 8.2 cm. 2. Additional 1 cm probable intramural fibroid in the right anterior uterine body. 3. No other acute or worrisome pelvic abnormality.   03/29/2020 Genetic Testing   Negative genetic testing on the common hereditary cancer panel.  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  The Common Hereditary Gene Panel offered by [facility] includes sequencing and/or deletion duplication testing of the following 47 genes: APC, ATM, AXIN2, BARD1, BMPR1A, BRCA1, BRCA2, BRIP1, CDH1, CDK4, CDKN2A (p14ARF), CDKN2A (p16INK4a), CHEK2, CTNNA1, DICER1, EPCAM (Deletion/duplication testing only), GREM1 (promoter region deletion/duplication testing only), KIT, MEN1, MLH1, MSH2, MSH3, MSH6, MUTYH, NBN, NF1, NHTL1, PALB2, PDGFRA, PMS2, POLD1, POLE, PTEN, RAD50, RAD51C, RAD51D, SDHB, SDHC, SDHD, SMAD4, SMARCA4. STK11, TP53, TSC1, TSC2, and VHL.  The following genes were evaluated for sequence changes only: SDHA and HOXB13 c.251G>A variant only. The report date is 03/29/2020   04/28/2020 - 10/27/2020 Antibody Plan   Added Keytruda q3weeks starting 04/28/20 to  complete 1 year of treatment. Held since 10/27/20 due to skin rash and body aches.    08/17/2020 Breast MRI   IMPRESSION: 1. The biopsy proven malignancy in the left breast and the adjacent satellite lesion have resolved in the interval. No abnormalities in these locations today. 2. No MRI evidence of malignancy in the right breast. 3. No adenopathy identified today   09/20/2020 Surgery   LEFT MODIFIED RADICAL MASTECTOMY by Dr Donne Hazel   09/20/2020 Pathology Results   FINAL MICROSCOPIC DIAGNOSIS:   A. BREAST, LEFT, MODIFIED RADICAL MASTECTOMY:  - No residual invasive carcinoma in breast status post neoadjuvant  treatment.  - Metastatic carcinoma in (2) of (14) lymph nodes.  - Biopsy sites (one in breast, one in one of the positive lymph nodes).  - See oncology table.   B. ADDITIONAL AXILLARY CONTENTS, LEFT, DISSECTION:  - Metastatic carcinoma in (5) of (5) lymph nodes.    09/20/2020 Cancer Staging   Staging form: Breast, AJCC 8th Edition - Pathologic stage from 09/20/2020: No Stage Recommended (ypT0, pN2a, cM0, G3, ER-, PR-, HER2-) - Signed by Gardenia Phlegm, NP on 10/04/2020  Stage prefix: Post-therapy Histologic grading system: 3 grade system    10/30/2020 - 12/11/2020 Radiation Therapy   Adjuvant Radiation with Dr Sondra Come starting 10/30/20   10/30/2020 -  Chemotherapy   Adjuvant Xeloda starting 10/30/20 at 1560m BID M-F while on RT. Held since 11/20/2020 due to recurrent skin rash   01/18/2021 -  Chemotherapy    Patient is on Treatment Plan: COLORECTAL 5FU / LEUCOVORIN MODIFIED DEGRAMONT Q14D X 12 CYCLES       03/28/2021 Imaging   CT CAP  IMPRESSION: 1. Status post left modified radical mastectomy and left axillary lymph node dissection, with no definitive findings to suggest metastatic disease in the chest, abdomen or pelvis on today's examination. Developing postradiation fibrosis in the apex of the left upper lobe is new compared to the prior study, but typical  in the setting of prior axillary radiation therapy. 2. There appears to be 2 lesions in the uterus, one of which appears slightly enlarged compared to the prior study, while the largest lesion on the prior examination has partially regressed, as detailed above. 3. Hepatic steatosis.      INTERVAL HISTORY:  Caitlyn IANNELLIis here for a follow up of breast cancer. She was last seen by me on 03/30/21. She presents to the clinic alone. She reports she is feeling much better, with only a mild rash currently on her left shoulder. Her hair is growing back nicely. She notes her port site is irritated from difficulty accessing it this morning. She notes she has not had a full period since stopping chemo, only spotting.   All other systems were reviewed with the patient and are negative.  MEDICAL HISTORY:  Past Medical History:  Diagnosis Date   Anemia    Anxiety    Arthritis    [place]   Depression    GERD (gastroesophageal reflux disease)    Headache    History of radiation therapy 10/30/20-12/11/20   left chest wall and axilla - Dr. JGery Pray  Panic attack     SURGICAL HISTORY: Past Surgical History:  Procedure Laterality Date   MASTECTOMY MODIFIED RADICAL Left 09/20/2020   Procedure: LEFT MODIFIED RADICAL MASTECTOMY;  Surgeon: WRolm Bookbinder MD;  [facility]  Service: General;  Laterality: Left;  RNFA; PEC BLOCK;   PORTACATH PLACEMENT Right 03/21/2020   Procedure: INSERTION PORT-A-CATH WITH ULTRASOUND GUIDANCE;  Surgeon: WRolm Bookbinder MD;  [facility]  Service: General;  Laterality: Right;    I have reviewed the social history and family history with the patient and they are unchanged from previous note.  ALLERGIES:  is allergic to medroxyprogesterone and xeloda [capecitabine].  MEDICATIONS:  Current Outpatient Medications  Medication Sig Dispense Refill   ALPRAZolam (XANAX) 1 MG tablet Take 1 mg by mouth 3 (three)  times daily as needed for anxiety.      baclofen (LIORESAL) 10 MG tablet Take 1 tablet (10 mg total) by mouth 2 (two) times daily as needed for muscle spasms. 30 each 0   capecitabine (XELODA) 500 MG tablet Take 3 tablets (1,500 mg total) by mouth every 12 (twelve) hours. 28 tablet 1   citalopram (CELEXA) 20 MG tablet Take 20 mg by mouth daily.     diphenhydrAMINE (BENADRYL) 25 mg capsule Take 25 mg by mouth every 6 (six) hours as needed for allergies.     diphenhydrAMINE HCl (ZZZQUIL) 50 MG/30ML LIQD Take 30 mLs by mouth at bedtime as needed (  sleep).     fluticasone (FLONASE) 50 MCG/ACT nasal spray Place 2 sprays into both nostrils daily as needed for allergies or rhinitis.     ibuprofen (ADVIL) 200 MG tablet Take 800 mg by mouth every 8 (eight) hours as needed for moderate pain.     montelukast (SINGULAIR) 10 MG tablet TAKE 1 TABLET BY MOUTH EVERYDAY AT BEDTIME 30 tablet 0   triamcinolone ointment (KENALOG) 0.5 % Apply 1 application topically 2 (two) times daily. 30 g 2   No current facility-administered medications for this visit.   Facility-Administered Medications Ordered in Other Visits  Medication Dose Route Frequency Provider Last Rate Last Admin   sodium chloride flush (NS) 0.9 % injection 10 mL  10 mL Intravenous PRN Alla Feeling, NP   10 mL at 03/28/20 1104    PHYSICAL EXAMINATION: ECOG PERFORMANCE STATUS: 1 - Symptomatic but completely ambulatory  Vitals:   05/08/21 1001  BP: 106/80  Pulse: 87  Resp: 18  Temp: 98.5 F (36.9 C)  SpO2: 100%   Wt Readings from Last 3 Encounters:  05/08/21 232 lb 6.4 oz (105.4 kg)  03/30/21 228 lb 1.6 oz (103.5 kg)  03/15/21 224 lb 12.8 oz (102 kg)     GENERAL:alert, no distress and comfortable SKIN: skin color normal, no significant lesions, (+) minimal rash to left shoulder, mild erythema to port site EYES: normal, Conjunctiva are pink and non-injected, sclera clear  NEURO: alert & oriented x 3 with fluent speech  LABORATORY  DATA:  I have reviewed the data as listed CBC Latest Ref Rng & Units 05/08/2021 03/30/2021 03/15/2021  WBC 4.0 - 10.5 K/uL 5.4 7.3 9.7  Hemoglobin 12.0 - 15.0 g/dL 11.4(L) 11.0(L) 12.2  Hematocrit 36.0 - 46.0 % 35.3(L) 33.8(L) 37.0  Platelets 150 - 400 K/uL 251 256 205     CMP Latest Ref Rng & Units 05/08/2021 03/30/2021 03/15/2021  Glucose 70 - 99 mg/dL 89 93 85  BUN 6 - 20 mg/dL _0 Creatinine 0.44 - 1.00 mg/dL 0.76 0.79 0.86  Sodium 135 - 145 mmol/L 138 139 138  Potassium 3.5 - 5.1 mmol/L 4.1 4.3 4.3  Chloride 98 - 111 mmol/L 105 104 103  CO2 22 - 32 mmol/L _1 Calcium 8.9 - 10.3 mg/dL 9.0 9.6 9.4  Total Protein 6.5 - 8.1 g/dL 6.9 7.0 7.9  Total Bilirubin 0.3 - 1.2 mg/dL 0.8 0.4 1.2  Alkaline Phos 38 - 126 U/L 150(H) 181(H) 193(H)  AST 15 - 41 U/L 14(L) 19 48(H)  ALT 0 - 44 U/L 17 30 53(H)      RADIOGRAPHIC STUDIES: I have personally reviewed the radiological images as listed and agreed with the findings in the report. No results found.    Orders Placed This Encounter  Procedures   CT CHEST ABDOMEN PELVIS W CONTRAST    Standing Status:   Future    Standing Expiration Date:   05/08/2022    Order Specific Question:   Is patient pregnant?    Answer:   No    Order Specific Question:   Preferred imaging location?    Answer:   [facility]    Order Specific Question:   Release to patient    Answer:   Manual release only    Order Specific Question:   Reason for preventing immediate release    Answer:   Patient request [1]    Order Specific Question:   Additional details for preventing immediate release  Answer:   pt has anxiety issue    Order Specific Question:   Is Oral Contrast requested for this exam?    Answer:   Yes, Per Radiology protocol   All questions were answered. The patient knows to call the clinic with any problems, questions or concerns. No barriers to learning was detected. The total time spent in the appointment was 30 minutes.     Truitt Merle, MD 05/08/2021   I, Wilburn Mylar, am acting as scribe for Truitt Merle, MD.   I have reviewed the above documentation for accuracy and completeness, and I agree with the above.

## 2021-05-14 ENCOUNTER — Other Ambulatory Visit: Payer: Self-pay | Admitting: Hematology

## 2021-05-14 DIAGNOSIS — Z171 Estrogen receptor negative status [ER-]: Secondary | ICD-10-CM

## 2021-05-14 DIAGNOSIS — C50112 Malignant neoplasm of central portion of left female breast: Secondary | ICD-10-CM

## 2021-05-14 MED ORDER — CAPECITABINE 500 MG PO TABS: 1500.0000 mg | ORAL_TABLET | Freq: Two times a day (BID) | ORAL | 0 refills | Status: DC

## 2021-05-15 ENCOUNTER — Telehealth: Payer: Self-pay

## 2021-05-15 NOTE — Telephone Encounter (Signed)
Received a fax regarding a refill for capecitabine 500Mg  tablets, given to Dr.Feng. Dr.Feng e-scribed the medication on 05/14/21.

## 2021-05-30 ENCOUNTER — Other Ambulatory Visit: Payer: Self-pay | Admitting: Hematology

## 2021-06-13 ENCOUNTER — Encounter: Payer: Self-pay | Admitting: Obstetrics & Gynecology

## 2021-06-13 ENCOUNTER — Other Ambulatory Visit: Payer: Self-pay

## 2021-06-13 ENCOUNTER — Other Ambulatory Visit (HOSPITAL_COMMUNITY)
Admission: RE | Admit: 2021-06-13 | Discharge: 2021-06-13 | Disposition: A | Discharge status: Home / Self Care | Payer: 59 | Source: Ambulatory Visit | Attending: Obstetrics & Gynecology | Admitting: Obstetrics & Gynecology

## 2021-06-13 ENCOUNTER — Ambulatory Visit (INDEPENDENT_AMBULATORY_CARE_PROVIDER_SITE_OTHER): Payer: 59 | Admitting: Obstetrics & Gynecology

## 2021-06-13 VITALS — BP 115/79 | HR 120 | Ht 64.0 in | Wt 232.0 lb

## 2021-06-13 DIAGNOSIS — D259 Leiomyoma of uterus, unspecified: Secondary | ICD-10-CM | POA: Insufficient documentation

## 2021-06-13 DIAGNOSIS — Z171 Estrogen receptor negative status [ER-]: Secondary | ICD-10-CM

## 2021-06-13 DIAGNOSIS — C50112 Malignant neoplasm of central portion of left female breast: Secondary | ICD-10-CM | POA: Insufficient documentation

## 2021-06-13 DIAGNOSIS — N898 Other specified noninflammatory disorders of vagina: Secondary | ICD-10-CM | POA: Diagnosis present

## 2021-06-13 NOTE — Progress Notes (Signed)
Patient ID: Caitlyn Hendricks, female   DOB: Oct 09, 1980, 40 y.o.   MRN: [id number]  Chief Complaint  Patient presents with   Gynecologic Exam    HPI Caitlyn Hendricks is a 40 y.o. female.  No obstetric history on file. No LMP recorded. (Menstrual status: Chemotherapy). She is referred for evaluation uterine fibroids. She was amenorrheic while on chemotherapy for breast cancer but she had a period in September that was heavy.  HPI  Past Medical History:  Diagnosis Date   Anemia    Anxiety    Arthritis    Cancer ([place])    Left Breast   Depression    GERD (gastroesophageal reflux disease)    Headache    History of radiation therapy 10/30/20-12/11/20   left chest wall and axilla - Dr. Gery Pray   Panic attack     Past Surgical History:  Procedure Laterality Date   MASTECTOMY MODIFIED RADICAL Left 09/20/2020   Procedure: LEFT MODIFIED RADICAL MASTECTOMY;  Surgeon: [name], MD;  Location: [facility];  Service: General;  Laterality: Left;  RNFA; [place];   PORTACATH PLACEMENT Right 03/21/2020   Procedure: INSERTION PORT-A-CATH WITH ULTRASOUND GUIDANCE;  Surgeon: [name], MD;  Location: [facility];  Service: General;  Laterality: Right;    Family History  Problem Relation Age of Onset   Cancer Father        prostate cancer    Cancer Maternal Grandmother 68       breast cancer    Social History Social History   Tobacco Use   Smoking status: Never   Smokeless tobacco: Former  Scientific laboratory technician Use: Never used  Substance Use Topics   Alcohol use: Not Currently    Comment: bing drink for last year, usually liquor    Drug use: Yes    Frequency: 7.0 times per week    Types: Marijuana    Comment: 2 puffs a day    Allergies  Allergen Reactions   Medroxyprogesterone Other (See Comments)    Heavy Bleeding Heavy Bleeding    Xeloda [Capecitabine] Rash    Current Outpatient Medications  Medication Sig Dispense Refill   ALPRAZolam (XANAX) 1 MG  tablet Take 1 mg by mouth 3 (three) times daily as needed for anxiety.      citalopram (CELEXA) 20 MG tablet Take 20 mg by mouth daily.     hydroxychloroquine (PLAQUENIL) 200 MG tablet Take 200 mg by mouth 2 (two) times daily.     ibuprofen (ADVIL) 200 MG tablet Take 800 mg by mouth every 8 (eight) hours as needed for moderate pain.     baclofen (LIORESAL) 10 MG tablet Take 1 tablet (10 mg total) by mouth 2 (two) times daily as needed for muscle spasms. (Patient not taking: Reported on 06/13/2021) 30 each 0   capecitabine (XELODA) 500 MG tablet Take 3 tablets (1,500 mg total) by mouth every 12 (twelve) hours. (Patient not taking: Reported on 06/13/2021) 28 tablet 0   diphenhydrAMINE (BENADRYL) 25 mg capsule Take 25 mg by mouth every 6 (six) hours as needed for allergies. (Patient not taking: Reported on 06/13/2021)     diphenhydrAMINE HCl (ZZZQUIL) 50 MG/30ML LIQD Take 30 mLs by mouth at bedtime as needed (sleep). (Patient not taking: Reported on 06/13/2021)     fluticasone (FLONASE) 50 MCG/ACT nasal spray Place 2 sprays into both nostrils daily as needed for allergies or rhinitis. (Patient not taking: Reported on 06/13/2021)     montelukast (  SINGULAIR) 10 MG tablet TAKE 1 TABLET BY MOUTH EVERYDAY AT BEDTIME (Patient not taking: Reported on 06/13/2021) 30 tablet 0   triamcinolone ointment (KENALOG) 0.5 % Apply 1 application topically 2 (two) times daily. (Patient not taking: Reported on 06/13/2021) 30 g 2   No current facility-administered medications for this visit.   Facility-Administered Medications Ordered in Other Visits  Medication Dose Route Frequency Provider Last Rate Last Admin   sodium chloride flush (NS) 0.9 % injection 10 mL  10 mL Intravenous PRN Alla Feeling, NP   10 mL at 03/28/20 1104    Review of Systems Review of Systems  Endocrine:       Hot flushes and sweats  Genitourinary:  Positive for menstrual problem (last menses was heavy with pain).   Blood pressure 115/79, pulse (!)  120, height 5\' 4"  (1.626 m), weight 232 lb (105.2 kg).  Physical Exam Physical Exam Vitals and nursing note reviewed. Exam conducted with a chaperone present.  Constitutional:      Appearance: She is obese.  HENT:     Head: Normocephalic.  Pulmonary:     Effort: Pulmonary effort is normal.  Abdominal:     Palpations: Abdomen is soft. There is no mass.     Tenderness: There is no abdominal tenderness.  Genitourinary:    General: Normal vulva.     Vagina: Normal.     Cervix: Normal.     Uterus: Normal.      Comments: Difficult to assess for pelvic mass due to obesity Skin:    General: Skin is warm and dry.  Neurological:     Mental Status: She is alert.  Psychiatric:        Mood and Affect: Mood normal.        Behavior: Behavior normal.    Data Reviewed CLINICAL DATA:  Evaluation of a pelvic mass seen on comparison CT   EXAM: TRANSABDOMINAL ULTRASOUND OF PELVIS   TECHNIQUE: Transabdominal ultrasound examination of the pelvis was performed including evaluation of the uterus, ovaries, adnexal regions, and pelvic cul-de-sac.   COMPARISON:  CT abdomen pelvis 03/17/2020   FINDINGS: Uterus   Measurements: 11.1 x 3.4 x 4.6 cm = volume: 90.1 mL. There is a large pedunculated lesion which appears directly contiguous with the uterine fundus demonstrating a contiguous vascular pedicle most suggestive of a large subserosal fibroid which measures up to 7.6 x 7.2 x 8.2 cm. An additional hypoechoic 1 x 0.8 x 0.8 cm probable intramural fibroid in the right anterior aspect of the uterine body is seen as well.   Endometrium   Thickness: 3.8 mm, non thickened.  No focal abnormality visualized.   Right ovary   Measurements: 2.9 x 1.6 x 2.7 cm = volume: 6.5 mL. Normal appearance/no adnexal mass.   Left ovary   Measurements: 3.8 x 1.9 x 1.6 cm = volume: 5.8 mL. Normal appearance/no adnexal mass.   Other findings:  No abnormal free fluid.   IMPRESSION: 1. Large  pedunculated lesion directly contiguous with the uterine most suggestive of a large subserosal fibroid which measures up to 8.2 cm. 2. Additional 1 cm probable intramural fibroid in the right anterior uterine body. 3. No other acute or worrisome pelvic abnormality.     Electronically Signed   By: Lovena Le M.D.   On: 03/25/2020 00:29    Assessment Malignant neoplasm of central portion of left breast in female, estrogen receptor negative ([place]) - Plan: Cytology - PAP, [facility]  Uterine leiomyoma, unspecified location -  Plan: Cytology - PAP, [facility]  Vaginal discharge - Plan: Cervicovaginal ancillary only( [facility])   Plan We will f/u on today's testing and RTC in 6 weeks    Emeterio Reeve 06/13/2021, 2:56 PM

## 2021-06-13 NOTE — Progress Notes (Signed)
NGYN patient here to establish care.  Hx of Breast Cancer in Left Breast. Has Lupus.   Has MRI scheduled tomorrow.   LMP: 05/30/21 lasted 9 days after not having cycle x 1 yr due to heavy chemo.  Last pap:03/31/2015  STD Screening: declines  Family Hx of Breast Cancer: MGM   CC: Fibroids, pt notes discomfort and states she can feel fibroids.  pt wants to discuss surgery.

## 2021-06-14 ENCOUNTER — Other Ambulatory Visit: Payer: 59

## 2021-06-14 LAB — CERVICOVAGINAL ANCILLARY ONLY
Bacterial Vaginitis (gardnerella): NEGATIVE
Candida Glabrata: NEGATIVE
Candida Vaginitis: NEGATIVE
Comment: NEGATIVE
Comment: NEGATIVE
Comment: NEGATIVE

## 2021-06-14 LAB — CYTOLOGY - PAP
Comment: NEGATIVE
Diagnosis: NEGATIVE
High risk HPV: NEGATIVE

## 2021-06-14 LAB — FOLLICLE STIMULATING HORMONE: FSH: 112 m[IU]/mL

## 2021-06-15 ENCOUNTER — Ambulatory Visit (HOSPITAL_COMMUNITY)
Admission: RE | Admit: 2021-06-15 | Discharge: 2021-06-15 | Disposition: A | Discharge status: Home / Self Care | Payer: 59 | Source: Ambulatory Visit | Attending: Hematology | Admitting: Hematology

## 2021-06-15 ENCOUNTER — Other Ambulatory Visit: Payer: Self-pay

## 2021-06-15 ENCOUNTER — Encounter (HOSPITAL_COMMUNITY): Payer: Self-pay

## 2021-06-15 ENCOUNTER — Inpatient Hospital Stay: Payer: 59 | Attending: Hematology

## 2021-06-15 DIAGNOSIS — C50112 Malignant neoplasm of central portion of left female breast: Secondary | ICD-10-CM

## 2021-06-15 DIAGNOSIS — Z171 Estrogen receptor negative status [ER-]: Secondary | ICD-10-CM

## 2021-06-15 DIAGNOSIS — Z923 Personal history of irradiation: Secondary | ICD-10-CM | POA: Insufficient documentation

## 2021-06-15 DIAGNOSIS — F419 Anxiety disorder, unspecified: Secondary | ICD-10-CM | POA: Insufficient documentation

## 2021-06-15 DIAGNOSIS — Z853 Personal history of malignant neoplasm of breast: Secondary | ICD-10-CM | POA: Insufficient documentation

## 2021-06-15 DIAGNOSIS — K219 Gastro-esophageal reflux disease without esophagitis: Secondary | ICD-10-CM | POA: Insufficient documentation

## 2021-06-15 DIAGNOSIS — L930 Discoid lupus erythematosus: Secondary | ICD-10-CM | POA: Insufficient documentation

## 2021-06-15 DIAGNOSIS — Z95828 Presence of other vascular implants and grafts: Secondary | ICD-10-CM

## 2021-06-15 DIAGNOSIS — N920 Excessive and frequent menstruation with regular cycle: Secondary | ICD-10-CM | POA: Insufficient documentation

## 2021-06-15 DIAGNOSIS — Z8616 Personal history of COVID-19: Secondary | ICD-10-CM | POA: Insufficient documentation

## 2021-06-15 DIAGNOSIS — M2550 Pain in unspecified joint: Secondary | ICD-10-CM | POA: Insufficient documentation

## 2021-06-15 DIAGNOSIS — Z9012 Acquired absence of left breast and nipple: Secondary | ICD-10-CM | POA: Insufficient documentation

## 2021-06-15 DIAGNOSIS — R21 Rash and other nonspecific skin eruption: Secondary | ICD-10-CM | POA: Insufficient documentation

## 2021-06-15 DIAGNOSIS — Z79899 Other long term (current) drug therapy: Secondary | ICD-10-CM | POA: Insufficient documentation

## 2021-06-15 DIAGNOSIS — L309 Dermatitis, unspecified: Secondary | ICD-10-CM | POA: Insufficient documentation

## 2021-06-15 LAB — CBC WITH DIFFERENTIAL/PLATELET
Abs Immature Granulocytes: 0.01 10*3/uL (ref 0.00–0.07)
Basophils Absolute: 0 10*3/uL (ref 0.0–0.1)
Basophils Relative: 1 %
Eosinophils Absolute: 0.1 10*3/uL (ref 0.0–0.5)
Eosinophils Relative: 1 %
HCT: 35.4 % — ABNORMAL LOW (ref 36.0–46.0)
Hemoglobin: 11.7 g/dL — ABNORMAL LOW (ref 12.0–15.0)
Immature Granulocytes: 0 %
Lymphocytes Relative: 13 %
Lymphs Abs: 0.6 10*3/uL — ABNORMAL LOW (ref 0.7–4.0)
MCH: 32.6 pg (ref 26.0–34.0)
MCHC: 33.1 g/dL (ref 30.0–36.0)
MCV: 98.6 fL (ref 80.0–100.0)
Monocytes Absolute: 0.4 10*3/uL (ref 0.1–1.0)
Monocytes Relative: 9 %
Neutro Abs: 3.3 10*3/uL (ref 1.7–7.7)
Neutrophils Relative %: 76 %
Platelets: 263 10*3/uL (ref 150–400)
RBC: 3.59 MIL/uL — ABNORMAL LOW (ref 3.87–5.11)
RDW: 16.6 % — ABNORMAL HIGH (ref 11.5–15.5)
WBC: 4.3 10*3/uL (ref 4.0–10.5)
nRBC: 0 % (ref 0.0–0.2)

## 2021-06-15 LAB — COMPREHENSIVE METABOLIC PANEL
ALT: 22 U/L (ref 0–44)
AST: 19 U/L (ref 15–41)
Albumin: 3.4 g/dL — ABNORMAL LOW (ref 3.5–5.0)
Alkaline Phosphatase: 186 U/L — ABNORMAL HIGH (ref 38–126)
Anion gap: 9 (ref 5–15)
BUN: 13 mg/dL (ref 6–20)
CO2: 26 mmol/L (ref 22–32)
Calcium: 9 mg/dL (ref 8.9–10.3)
Chloride: 103 mmol/L (ref 98–111)
Creatinine, Ser: 0.82 mg/dL (ref 0.44–1.00)
GFR, Estimated: >60 mL/min (ref >60)
Glucose, Bld: 94 mg/dL (ref 70–99)
Potassium: 3.9 mmol/L (ref 3.5–5.1)
Sodium: 138 mmol/L (ref 135–145)
Total Bilirubin: 0.8 mg/dL (ref 0.3–1.2)
Total Protein: 7 g/dL (ref 6.5–8.1)

## 2021-06-15 IMAGING — CT CT CHEST-ABD-PELV W/ CM
2 of 4 series · 14 of 36 positions shown, 16 images · IV contrast (OMNIPAQUE)
Comparison: [DATE]

CLINICAL DATA: Left breast cancer.

EXAM:
CT CHEST, ABDOMEN, AND PELVIS WITH CONTRAST
TECHNIQUE: Multidetector CT imaging of the chest, abdomen and pelvis was
performed following the standard protocol during bolus
administration of intravenous contrast.
CONTRAST:  75mL OMNIPAQUE IOHEXOL 350 MG/ML SOLN

[Series 2: cap with · axial · 0.82mm/px · z∈[-656,-61]mm · 11 of 143 slices shown, 13 images]
[im 12/143  mediastinal]
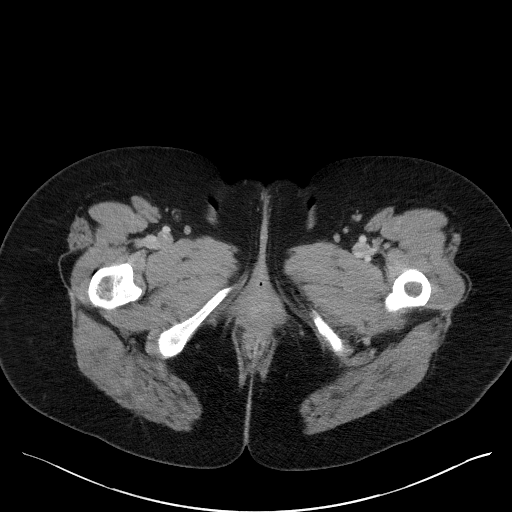
[im 12/143  bone]
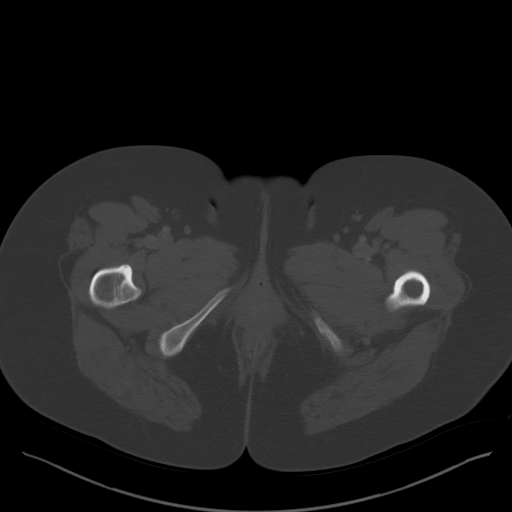
[im 24/143  mediastinal]
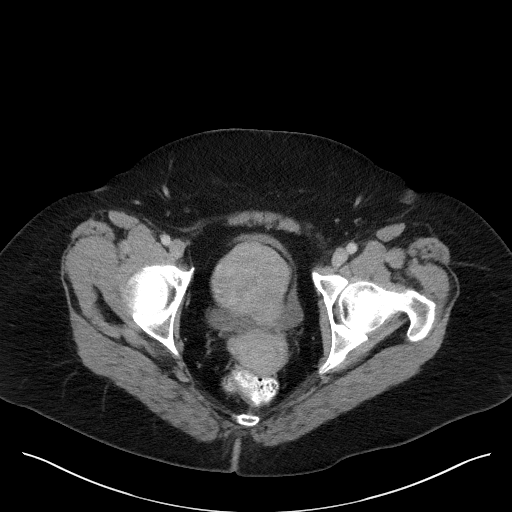
[im 36/143  mediastinal]
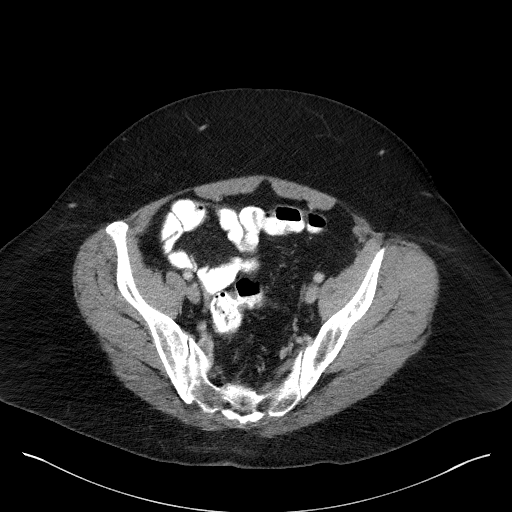
[im 48/143  mediastinal]
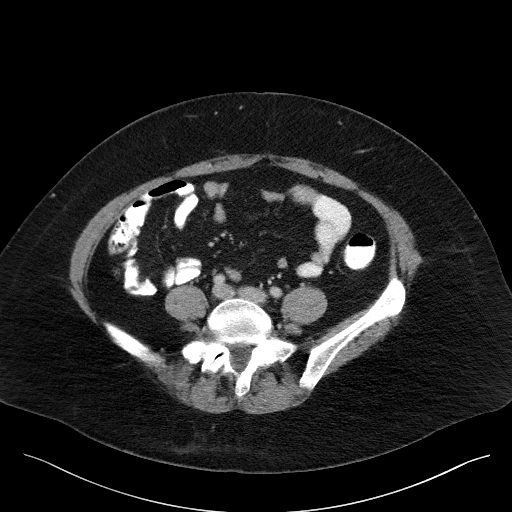
[im 60/143  mediastinal]
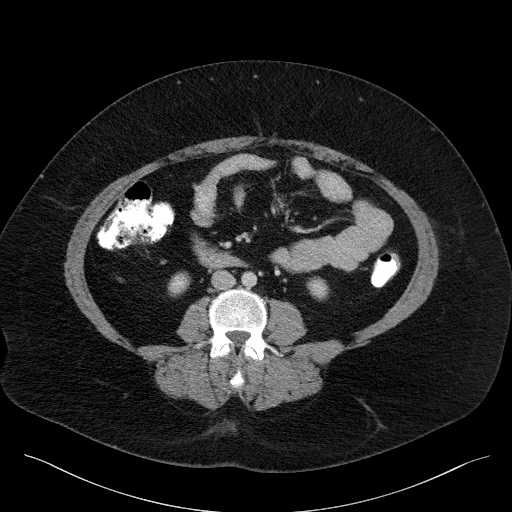
[im 72/143  mediastinal]
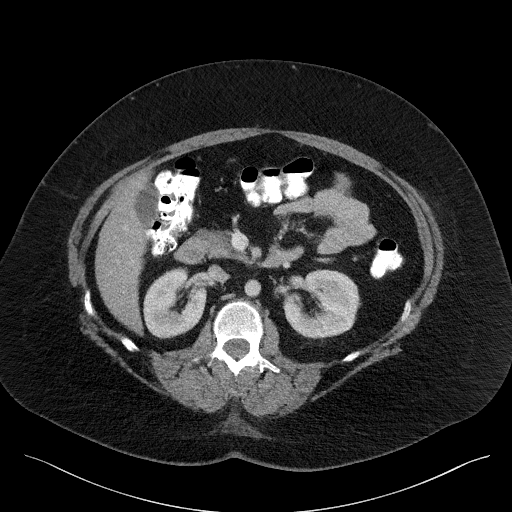
[im 83/143  mediastinal]
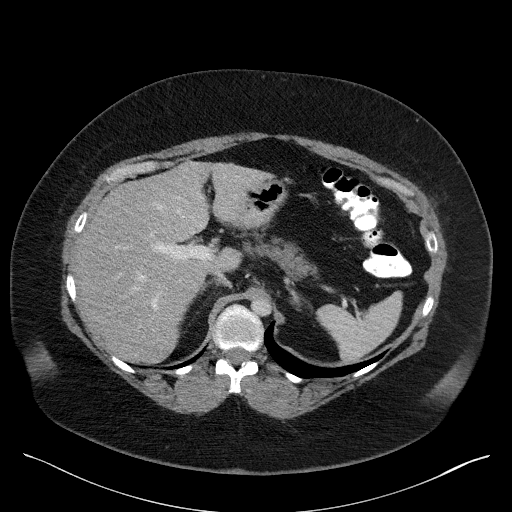
[im 95/143  mediastinal]
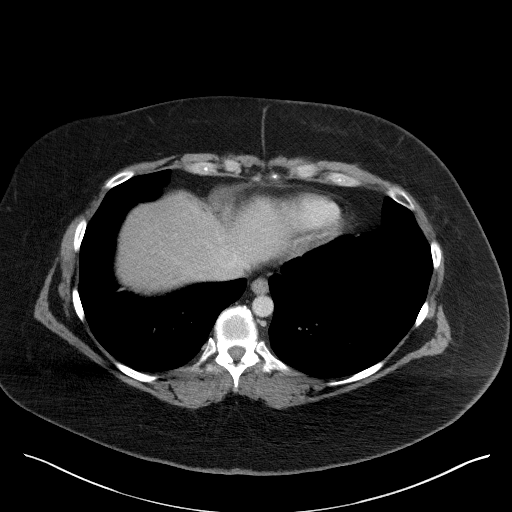
[im 107/143  mediastinal]
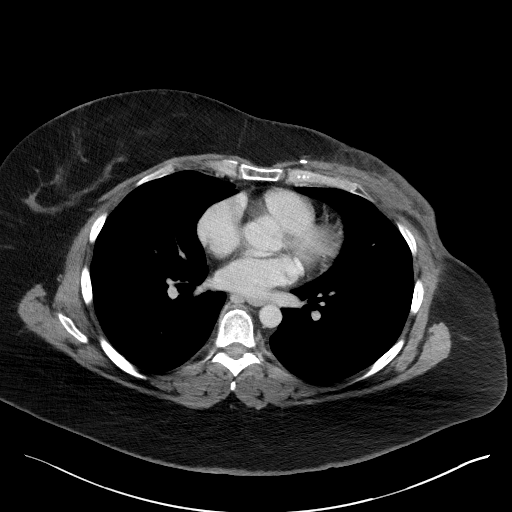
[im 107/143  bone]
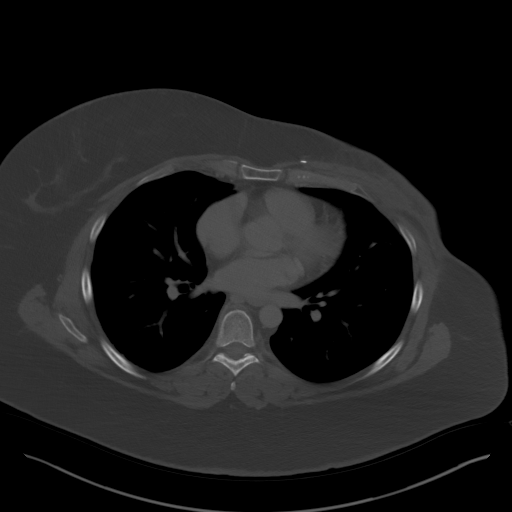
[im 119/143  mediastinal]
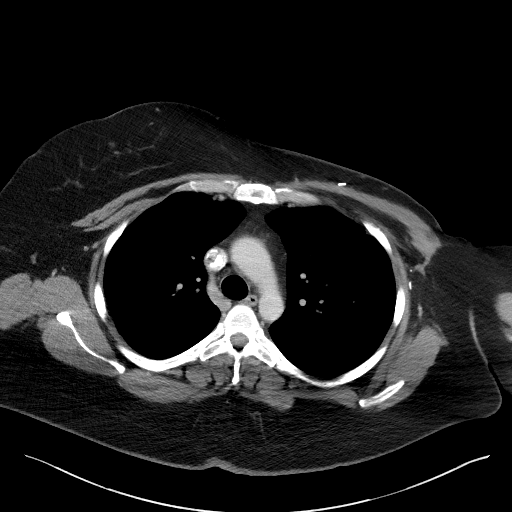
[im 131/143  mediastinal]
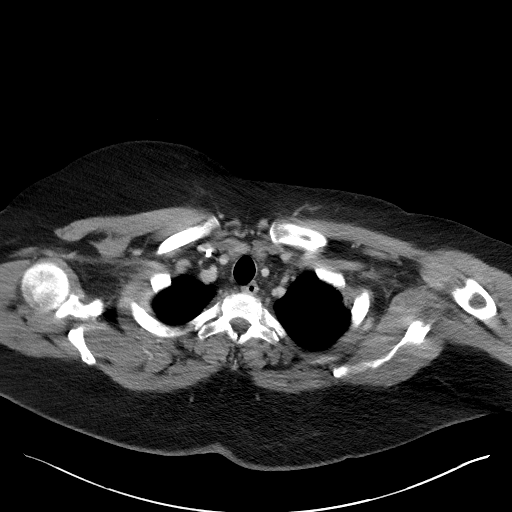

[Series 4: coronals · coronal · 0.86mm/px · 3 of 165 slices shown]
[im 33/165  mediastinal]
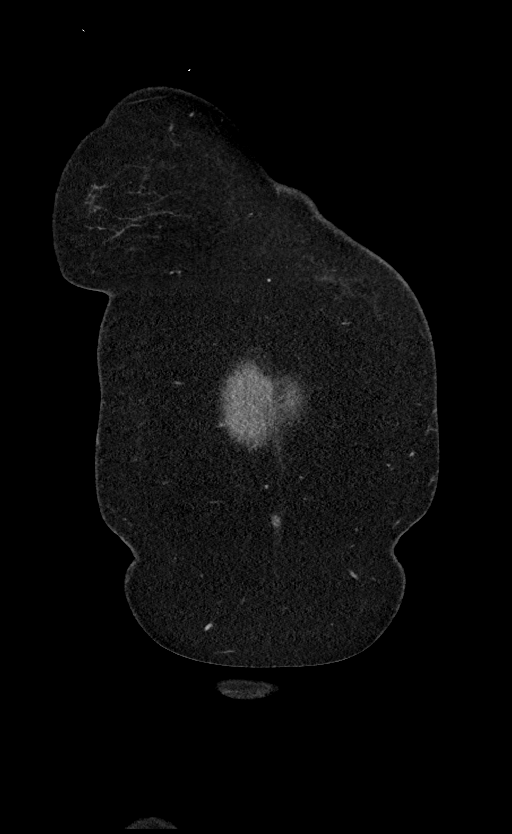
[im 66/165  mediastinal]
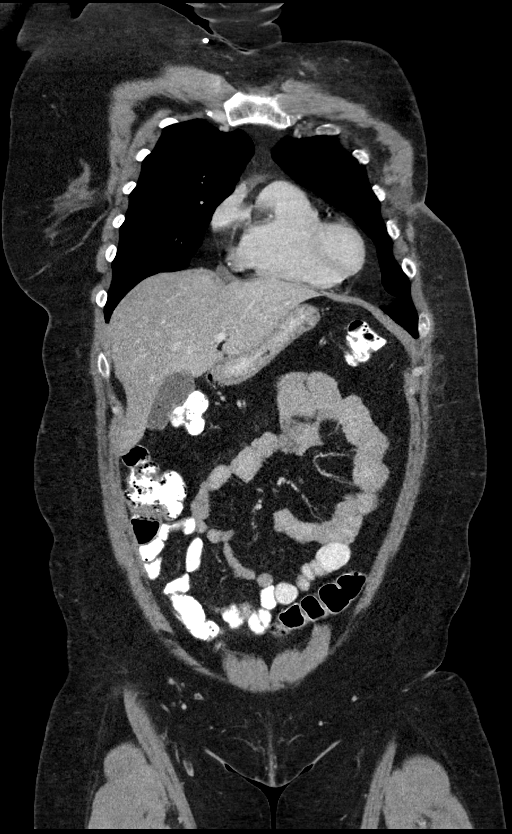
[im 99/165  mediastinal]
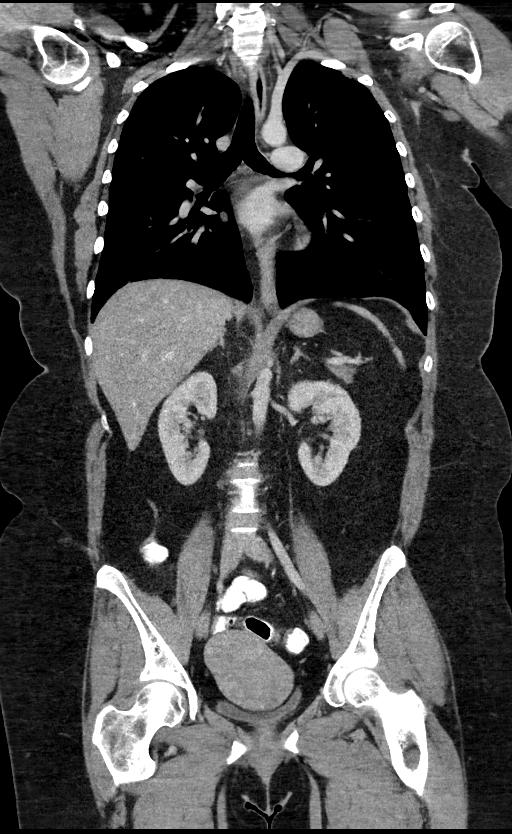

[14 of 36 positions shown; findings below may reference images not displayed]

FINDINGS: CT CHEST FINDINGS

Cardiovascular: The heart size is normal. No substantial pericardial
effusion. Right Port-A-Cath tip is positioned at the mid SVC.

Mediastinum/Nodes: No mediastinal lymphadenopathy. There is no hilar
lymphadenopathy. The esophagus has normal imaging features. There is
no axillary lymphadenopathy. Surgical clips are noted in the left
axilla.

Lungs/Pleura: Post radiation scarring again noted left lung apex,
with interval evolution. Subpleural reticulation anterior left upper
lobe is also likely radiation related. No new suspicious pulmonary
nodule or mass. No focal airspace consolidation. No pleural
effusion.

Musculoskeletal: No worrisome lytic or sclerotic osseous
abnormality.

CT ABDOMEN PELVIS FINDINGS

Hepatobiliary: No suspicious focal abnormality within the liver
parenchyma. There is no evidence for gallstones, gallbladder wall
thickening, or pericholecystic fluid. No intrahepatic or
extrahepatic biliary dilation.

Pancreas: No focal mass lesion. No dilatation of the main duct. No
intraparenchymal cyst. No peripancreatic edema.

Spleen: No splenomegaly. No focal mass lesion.

Adrenals/Urinary Tract: No adrenal nodule or mass. Kidneys
unremarkable. No evidence for hydroureter. The urinary bladder
appears normal for the degree of distention.

Stomach/Bowel: Stomach is unremarkable. No gastric wall thickening.
No evidence of outlet obstruction. Duodenum is normally positioned
as is the ligament of Treitz. No small bowel wall thickening. No
small bowel dilatation. The terminal ileum is normal. The appendix
is normal. No gross colonic mass. No colonic wall thickening.

Vascular/Lymphatic: No abdominal aortic aneurysm. There is no
gastrohepatic or hepatoduodenal ligament lymphadenopathy. No
retroperitoneal or mesenteric lymphadenopathy. No pelvic sidewall
lymphadenopathy.

Reproductive: Heterogeneous lobular uterus is consistent with
fibroids. There is no adnexal mass.

Other: No intraperitoneal free fluid.

Musculoskeletal: No worrisome lytic or sclerotic osseous
abnormality.
IMPRESSION: 1. Stable exam. No evidence for metastatic disease in the chest,
abdomen, or pelvis.
2. Interval evolution of post radiation scarring in the left lung.
3. Uterine fibroids.

## 2021-06-15 MED ORDER — IOHEXOL 350 MG/ML SOLN
75.0000 mL | Freq: Once | INTRAVENOUS | Status: AC | PRN
  Administered 2021-06-15: 75 mL via INTRAVENOUS

## 2021-06-15 MED ORDER — HEPARIN SOD (PORK) LOCK FLUSH 100 UNIT/ML IV SOLN
INTRAVENOUS | Status: AC
  Filled 2021-06-15: qty 5

## 2021-06-15 MED ORDER — HEPARIN SOD (PORK) LOCK FLUSH 100 UNIT/ML IV SOLN
500.0000 [IU] | Freq: Once | INTRAVENOUS | Status: AC
Start: 2021-06-15 — End: 2021-06-15
  Administered 2021-06-15: 500 [IU] via INTRAVENOUS

## 2021-06-15 MED ORDER — SODIUM CHLORIDE (PF) 0.9 % IJ SOLN
INTRAMUSCULAR | Status: AC
  Filled 2021-06-15: qty 50

## 2021-06-15 MED ORDER — SODIUM CHLORIDE 0.9% FLUSH
10.0000 mL | Freq: Once | INTRAVENOUS | Status: AC
Start: 2021-06-15 — End: 2021-06-15
  Administered 2021-06-15: 10 mL

## 2021-06-18 ENCOUNTER — Telehealth: Payer: Self-pay

## 2021-06-18 ENCOUNTER — Other Ambulatory Visit: Payer: Self-pay

## 2021-06-18 ENCOUNTER — Inpatient Hospital Stay (HOSPITAL_BASED_OUTPATIENT_CLINIC_OR_DEPARTMENT_OTHER): Payer: 59 | Admitting: Hematology

## 2021-06-18 ENCOUNTER — Encounter: Payer: Self-pay | Admitting: Hematology

## 2021-06-18 VITALS — BP 119/95 | HR 95 | Temp 97.8°F | Resp 18 | Ht 64.0 in | Wt 229.1 lb

## 2021-06-18 DIAGNOSIS — Z79899 Other long term (current) drug therapy: Secondary | ICD-10-CM | POA: Diagnosis not present

## 2021-06-18 DIAGNOSIS — Z853 Personal history of malignant neoplasm of breast: Secondary | ICD-10-CM | POA: Diagnosis present

## 2021-06-18 DIAGNOSIS — L309 Dermatitis, unspecified: Secondary | ICD-10-CM | POA: Diagnosis not present

## 2021-06-18 DIAGNOSIS — F419 Anxiety disorder, unspecified: Secondary | ICD-10-CM | POA: Diagnosis not present

## 2021-06-18 DIAGNOSIS — R21 Rash and other nonspecific skin eruption: Secondary | ICD-10-CM | POA: Diagnosis not present

## 2021-06-18 DIAGNOSIS — Z171 Estrogen receptor negative status [ER-]: Secondary | ICD-10-CM | POA: Diagnosis not present

## 2021-06-18 DIAGNOSIS — Z923 Personal history of irradiation: Secondary | ICD-10-CM | POA: Diagnosis not present

## 2021-06-18 DIAGNOSIS — K219 Gastro-esophageal reflux disease without esophagitis: Secondary | ICD-10-CM | POA: Diagnosis not present

## 2021-06-18 DIAGNOSIS — C50112 Malignant neoplasm of central portion of left female breast: Secondary | ICD-10-CM

## 2021-06-18 DIAGNOSIS — Z8616 Personal history of COVID-19: Secondary | ICD-10-CM | POA: Diagnosis not present

## 2021-06-18 DIAGNOSIS — L930 Discoid lupus erythematosus: Secondary | ICD-10-CM | POA: Diagnosis not present

## 2021-06-18 DIAGNOSIS — Z9012 Acquired absence of left breast and nipple: Secondary | ICD-10-CM | POA: Diagnosis not present

## 2021-06-18 DIAGNOSIS — N920 Excessive and frequent menstruation with regular cycle: Secondary | ICD-10-CM | POA: Diagnosis not present

## 2021-06-18 DIAGNOSIS — M2550 Pain in unspecified joint: Secondary | ICD-10-CM | POA: Diagnosis not present

## 2021-06-18 NOTE — Progress Notes (Signed)
Caitlyn Hendricks   [phone]) [phone] [phone]) [phone]   Clinic Follow up Note   Patient Care Team: [name], MD as PCP - General (Family Medicine) Mauro Kaufmann, RN as Oncology Nurse Navigator Rockwell Germany, RN as Oncology Nurse Navigator [name], MD as Consulting Physician (General Surgery) Truitt Merle, MD as Consulting Physician (Hematology) Gery Pray, MD as Consulting Physician (Radiation Oncology)  Date of Service:  06/18/2021  CHIEF COMPLAINT: f/u of left breast cancer  CURRENT THERAPY:  Restart Xeloda 2 weeks on/1 week off on 04/02/21  ASSESSMENT & PLAN:  Caitlyn Hendricks is a 40 y.o. female with   1. 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 Mammogram/US on 02/23/20 showed 2 adjacent left breast masses at 12 o'clock spanning 3.6cm and multiple enlarged left axillary (at least 5) and left subpectoral LNs. Her 03/08/20 biopsy revealed grade III invasive ductal carcinoma, triple negative, metastatic to her left axillary LN. CT CAP/Bone scan negative for distant mets.  -She completed neoadjuvant chemo with ddAC q2weeks for 4 cycles 03/22/20-05/03/20. Followed by weekly carbo/taxol for 12 weeks 05/17/20-08/16/20 before proceeding with surgery.  -she started [facility] for 1 year treatment on 04/28/20, but stopped in 10/2020 due to skin rash and arthralgia.   -She underwent left mastectomy with Dr Donne Hazel on 09/20/20. Surgical path shows No residual invasive carcinoma in breast status post neoadjuvant treatment, however has 7/19 positive LN.  -she began concurrent chemoRT with oral Xeloda on 10/30/20 and completed radiation on 12/11/20. She developed a rash from Xeloda, and this was held from 5/16-6/27/22. When this was restarted, she again developed a rash and joint pain. It was switched to  5-FU on 01/18/21 for 4 cycles. During that time, she was diagnosed with Lupus and put on medication. With the rash under control, she switched back to Xeloda and completed 6 months of treatment in 05/2021.  -given her triple negative disease, she is now on observation. -restaging CT CAP 06/15/21 showed no evidence of metastatic disease. I reviewed the images and discussed the results with them. -she had a heavy period around Thanksgiving but [facility] on 06/13/21 showed her to be postmenopausal. She was seen by GYN, who has scheduled her for endometrial biopsy. She is unsure what this is for. I discussed it could be due to her vaginal bleeding while her [facility] showed she is postmenopausal. She is also hoping to discuss hysterectomy with BSO; she notes she does not plan to have children. -we discussed cancer surveillance. Will see her every 3 months. Pt would like to have a PET scan when she has Medicaid.    2. Cutaneous Lupus -She had several episodes of rash since she started chemo treatment; skin biopsy showed a drug-related dermatitis. The rash persisted on Keytruda, Xeloda, and 5-FU. -she underwent lab work with her dermatologist on 02/27/21. Results revealed cutaneous Lupus. She is now on medication for this. -since being on the medication for Lupus, her rash has greatly improved.   3. Anxiety, Social support -She has a history of anxiety and has been on Xanax 22m TID and Celexa 415m -She has very good social support from mother and boyfriend. 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 -she reports she [place] in [employer] and hopes to get a PET scan.  4. Genetic Testing negative for pathogenetic mutations with VUS of POLE gene   5. COVID (+) on 08/02/20 with symptoms of Hoarseness and ribcage pain. She has recovered quickly and completely       PLAN: -lab and scan reviewed, NED -port flush in 6 weeks -lab, flush, and f/u in 3 months   No  problem-specific Assessment & Plan notes found for this encounter.   SUMMARY OF ONCOLOGIC HISTORY: Oncology History Overview Note  Cancer Staging Cancer of central portion of left female breast [facility]) Staging form: Breast, AJCC 8th Edition - Clinical stage from 03/08/2020: Stage IIIB (cT2, cN1, cM0, G3, ER-, PR-, HER2-) - Signed by Truitt Merle, MD on 03/10/2020 Stage prefix: Initial diagnosis Histologic grading system: 3 grade system - Pathologic stage from 09/20/2020: No Stage Recommended (ypT0, pN2a, cM0, G3, ER-, PR-, HER2-) - Signed by Gardenia Phlegm, NP on 10/04/2020 Stage prefix: Post-therapy Histologic grading system: 3 grade system    Cancer of central portion of left female breast ([place])  02/23/2020 Mammogram   Diagnostic Mammogram 02/23/20  IMPRESSION The 2x1x2.6cm irregular mass in teh left breast at 12:00 posiiton middle depth is highly suspicious of malignancy. An [place] is recommended for further evaluation and biopsy planning purposes.   02/23/2020 Breast US   [name] 02/23/20  IMPRESSION 2 adjacent spiculated masses in the left brast at 12:00 position 3 cm from the nipple (2.1x0.9x1.1cm and 1.1x1.4x0.5cm) is suggestive of malignancy.   Multiple abnormal left subpectoral and left axillary nodes measuring 3.3x2.1 cm concerning for metastatic adenopathy.    Left breast skin thickening and edema may be secondary congestive edema due to extensive axillary adenopathy.    03/08/2020 Initial Biopsy   Diagnosis 1.Breast, left, needle core biopsy, 12:00 position, 3cmfn -INVASIVE DUCTAL CARCINOMA -SEE COMMENT  2. Lymph node, needle/core biopsy, left axilla -METASTATIC CARCINOMA INVOLVING A LYMPH NODE  -LYMPHOVASCULAR SPACE INVASION PRESENT   Microscopic Comment  1.Based on the biopsy the carcinoma appears Nottingham Grade 3 or 3 and measures 1 cm in the greatest linear extent.    03/08/2020 Receptors her2   ER- Negative 0% PR - Negative 0% HER2 - Negative  KI 67 -  80%    03/08/2020 Cancer Staging   Staging form: Breast, AJCC 8th Edition - Clinical stage from 03/08/2020: Stage IIIB (cT2, cN1, cM0, G3, ER-, PR-, HER2-) - Signed by Truitt Merle, MD on 03/10/2020    03/10/2020 Initial Diagnosis   Cancer of central portion of left female breast ([place])   03/16/2020 Breast MRI   IMPRESSION: 1. 8.1 x 7.8 x 6.6 cm biopsy proven invasive ductal carcinoma in the central right breast, involving 3 quadrants. 2. 3.0 x 1.7 x 1.1 cm satellite mass more inferiorly in lower inner quadrant of the left breast, compatible with additional malignancy. 3. Metastatic level 1 and level 2 left axillary lymph nodes. 4. No evidence of malignancy on the right.   03/17/2020 Imaging   IMPRESSION: CT CAP w contrast  1. Diffuse skin thickening in the left breast with left axillary and subpectoral lymphadenopathy, as well as a mildly enlarged left supraclavicular lymph node, which likely represents metastatic lymphadenopathy. No other definite extra nodal metastatic disease noted elsewhere in the chest, abdomen or pelvis. 2. Large mass in the central anatomic pelvis which is of uncertain origin, potentially a large exophytic fibroid or a large solid mass arising from the right ovary. Further evaluation with pelvic ultrasound is strongly recommended.   03/21/2020 Imaging   Bone Scan  IMPRESSION: Apparent arthropathy at L5. No bony metastatic disease is demonstrable on this study. Scattered foci of abnormal uptake in a pelvic mass is of uncertain etiology given absence of calcification in this mass by CT. This mass compresses the urinary bladder. It is possible that some of the increased uptake in this area actually represents physiologic uptake within the bladder.   Kidneys noted in flank positions bilaterally.     03/22/2020 - 08/16/2020 Neo-Adjuvant Chemotherapy   Neoadjuvant Adriamycin and Cytoxan q2weeks for 4 cycles starting 03/22/20-05/03/20 followed by weekly Taxol and  Carboplatin for 12 weeks starting 05/17/20-08/16/20   03/24/2020 Imaging   US Pelvis  IMPRESSION: 1. Large pedunculated lesion directly contiguous with the uterine most suggestive of a large subserosal fibroid which measures up to 8.2 cm. 2. Additional 1 cm probable intramural fibroid in the right anterior uterine body. 3. No other acute or worrisome pelvic abnormality.   03/29/2020 Genetic Testing   Negative genetic testing on the common hereditary cancer panel.  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  The Common Hereditary Gene Panel offered by [facility] includes sequencing and/or deletion duplication testing of the following 47 genes: APC, ATM, AXIN2, BARD1, BMPR1A, BRCA1, BRCA2, BRIP1, CDH1, CDK4, CDKN2A (p14ARF), CDKN2A (p16INK4a), CHEK2, CTNNA1, DICER1, EPCAM (Deletion/duplication testing only), GREM1 (promoter region deletion/duplication testing only), KIT, MEN1, MLH1, MSH2, MSH3, MSH6, MUTYH, NBN, NF1, NHTL1, PALB2, PDGFRA, PMS2, POLD1, POLE, PTEN, RAD50, RAD51C, RAD51D, SDHB, SDHC, SDHD, SMAD4, SMARCA4. STK11, TP53, TSC1, TSC2, and VHL.  The following genes were evaluated for sequence changes only: SDHA and HOXB13 c.251G>A variant only. The report date is 03/29/2020   04/28/2020 - 10/27/2020 Antibody Plan   Added Keytruda q3weeks starting 04/28/20 to complete 1 year of treatment. Held since 10/27/20 due to skin rash and body aches.    08/17/2020 Breast MRI   IMPRESSION: 1. The biopsy proven malignancy in the left breast and the adjacent satellite lesion have resolved in the interval. No abnormalities in these locations today. 2. No MRI evidence of malignancy in the right breast. 3. No adenopathy identified today   09/20/2020 Surgery   LEFT MODIFIED RADICAL MASTECTOMY by Dr Donne Hazel   09/20/2020 Pathology Results   FINAL MICROSCOPIC DIAGNOSIS:   A. BREAST, LEFT, MODIFIED RADICAL MASTECTOMY:  - No residual invasive carcinoma in breast status post neoadjuvant  treatment.  - Metastatic  carcinoma in (2) of (14) lymph nodes.  - Biopsy sites (one in breast, one in one of the positive lymph nodes).  - See oncology table.   B. ADDITIONAL AXILLARY CONTENTS, LEFT, DISSECTION:  - Metastatic carcinoma in (5) of (5) lymph nodes.    09/20/2020 Cancer Staging   Staging form: Breast, AJCC 8th Edition - Pathologic stage from 09/20/2020: No Stage Recommended (ypT0, pN2a, cM0, G3, ER-, PR-, HER2-) - Signed by Gardenia Phlegm, NP on 10/04/2020 Stage prefix: Post-therapy Histologic grading system: 3 grade system    10/30/2020 - 12/11/2020 Radiation Therapy   Adjuvant Radiation with Dr Sondra Come starting 10/30/20   10/30/2020 -  Chemotherapy   Adjuvant Xeloda starting 10/30/20 at 1535m BID M-F while on RT. Held since 11/20/2020 due to recurrent skin rash   01/18/2021 -  Chemotherapy    Patient is on Treatment Plan: COLORECTAL 5FU / LEUCOVORIN MODIFIED DEGRAMONT Q14D X 12 CYCLES       03/28/2021 Imaging   CT CAP  IMPRESSION: 1. Status post left modified radical mastectomy and left axillary lymph node dissection, with no definitive findings to suggest metastatic disease in  the chest, abdomen or pelvis on today's examination. Developing postradiation fibrosis in the apex of the left upper lobe is new compared to the prior study, but typical in the setting of prior axillary radiation therapy. 2. There appears to be 2 lesions in the uterus, one of which appears slightly enlarged compared to the prior study, while the largest lesion on the prior examination has partially regressed, as detailed above. 3. Hepatic steatosis.   06/15/2021 Imaging   EXAM: CT CHEST, ABDOMEN, AND PELVIS WITH CONTRAST  IMPRESSION: 1. Stable exam. No evidence for metastatic disease in the chest, abdomen, or pelvis. 2. 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 3. Uterine fibroids.      INTERVAL HISTORY:  Caitlyn Hendricks is here for a follow up of breast cancer. She was last seen by  me on 05/08/21. She presents to the clinic accompanied by her boyfriend. She reports since stopping the Xeloda, she has either had diarrhea or constipation. She also reports bloating-- "I feel like I'm 9 months pregnant." She notes it feels like food sits in her stomach, so she has been doing a liquid diet lately. She reports she also saw her GYN due to uterine fibroids. She notes she can feel the fibroids when she bends over. She also reports she had a very heavy period around Thanksgiving that lasted about 9 days. 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   All other systems were reviewed with the patient and are negative.  MEDICAL HISTORY:  Past Medical History:  Diagnosis Date   Anemia    Anxiety    Arthritis    Cancer ([place])    Left Breast   Depression    GERD (gastroesophageal reflux disease)    Headache    History of radiation therapy 10/30/20-12/11/20   left chest wall and axilla - Dr. Gery Pray   Panic attack     SURGICAL HISTORY: Past Surgical History:  Procedure Laterality Date   MASTECTOMY MODIFIED RADICAL Left 09/20/2020   Procedure: LEFT MODIFIED RADICAL MASTECTOMY;  Surgeon: [name], MD;  Location: [facility];  Service: General;  Laterality: Left;  RNFA; PEC BLOCK;   PORTACATH PLACEMENT Right 03/21/2020   Procedure: INSERTION PORT-A-CATH WITH ULTRASOUND GUIDANCE;  Surgeon: [name], MD;  Location: [facility];  Service: General;  Laterality: Right;    I have reviewed the social history and family history with the patient and they are unchanged from previous note.  ALLERGIES:  is allergic to medroxyprogesterone and xeloda [capecitabine].  MEDICATIONS:  Current Outpatient Medications  Medication Sig Dispense Refill   ALPRAZolam (XANAX) 1 MG tablet Take 1 mg by mouth 3 (three) times daily as needed for anxiety.      baclofen (LIORESAL) 10 MG tablet Take 1 tablet (10 mg total) by mouth 2  (two) times daily as needed for muscle spasms. (Patient not taking: Reported on 06/13/2021) 30 each 0   capecitabine (XELODA) 500 MG tablet Take 3 tablets (1,500 mg total) by mouth every 12 (twelve) hours. (Patient not taking: Reported on 06/13/2021) 28 tablet 0   citalopram (CELEXA) 20 MG tablet Take 20 mg by mouth daily.     diphenhydrAMINE (BENADRYL) 25 mg capsule Take 25 mg by mouth every 6 (six) hours as needed for allergies. (Patient not taking: Reported on 06/13/2021)     diphenhydrAMINE HCl (ZZZQUIL) 50 MG/30ML LIQD Take 30 mLs by mouth at bedtime as needed (  sleep). (Patient not taking: Reported on 06/13/2021)     fluticasone (FLONASE) 50 MCG/ACT nasal spray Place 2 sprays into both nostrils daily as needed for allergies or rhinitis. (Patient not taking: Reported on 06/13/2021)     hydroxychloroquine (PLAQUENIL) 200 MG tablet Take 200 mg by mouth 2 (two) times daily.     ibuprofen (ADVIL) 200 MG tablet Take 800 mg by mouth every 8 (eight) hours as needed for moderate pain.     montelukast (SINGULAIR) 10 MG tablet TAKE 1 TABLET BY MOUTH EVERYDAY AT BEDTIME (Patient not taking: Reported on 06/13/2021) 30 tablet 0   triamcinolone ointment (KENALOG) 0.5 % Apply 1 application topically 2 (two) times daily. (Patient not taking: Reported on 06/13/2021) 30 g 2   No current facility-administered medications for this visit.   Facility-Administered Medications Ordered in Other Visits  Medication Dose Route Frequency Provider Last Rate Last Admin   sodium chloride flush (NS) 0.9 % injection 10 mL  10 mL Intravenous PRN Alla Feeling, NP   10 mL at 03/28/20 1104    PHYSICAL EXAMINATION: ECOG PERFORMANCE STATUS: 1 - Symptomatic but completely ambulatory  Vitals:   06/18/21 1414  BP: (!) 119/95  Pulse: 95  Resp: 18  Temp: 97.8 F (36.6 C)  SpO2: 100%   Wt Readings from Last 3 Encounters:  06/18/21 229 lb 1.6 oz (103.9 kg)  06/13/21 232 lb (105.2 kg)  05/08/21 232 lb 6.4 oz (105.4 kg)      GENERAL:alert, no distress and comfortable SKIN: skin color normal, no rashes or significant lesions EYES: normal, Conjunctiva are pink and non-injected, sclera clear  NEURO: alert & oriented x 3 with fluent speech  LABORATORY DATA:  I have reviewed the data as listed CBC Latest Ref Rng & Units 06/15/2021 05/08/2021 03/30/2021  WBC 4.0 - 10.5 K/uL 4.3 5.4 7.3  Hemoglobin 12.0 - 15.0 g/dL 11.7(L) 11.4(L) 11.0(L)  Hematocrit 36.0 - 46.0 % 35.4(L) 35.3(L) 33.8(L)  Platelets 150 - 400 K/uL 263 251 256     CMP Latest Ref Rng & Units 06/15/2021 05/08/2021 03/30/2021  Glucose 70 - 99 mg/dL 94 89 93  BUN 6 - 20 mg/dL 13 13 19   Creatinine 0.44 - 1.00 mg/dL 0.82 0.76 0.79  Sodium 135 - 145 mmol/L 138 138 139  Potassium 3.5 - 5.1 mmol/L 3.9 4.1 4.3  Chloride 98 - 111 mmol/L 103 105 104  CO2 22 - 32 mmol/L 26 26 25   Calcium 8.9 - 10.3 mg/dL 9.0 9.0 9.6  Total Protein 6.5 - 8.1 g/dL 7.0 6.9 7.0  Total Bilirubin 0.3 - 1.2 mg/dL 0.8 0.8 0.4  Alkaline Phos 38 - 126 U/L 186(H) 150(H) 181(H)  AST 15 - 41 U/L 19 14(L) 19  ALT 0 - 44 U/L 22 17 30       RADIOGRAPHIC STUDIES: I have personally reviewed the radiological images as listed and agreed with the findings in the report. No results found.    No orders of the defined types were placed in this encounter.  All questions were answered. The patient knows to call the clinic with any problems, questions or concerns. No barriers to learning was detected. The total time spent in the appointment was 30 minutes.     Truitt Merle, MD 06/18/2021   I, Wilburn Mylar, am acting as scribe for Truitt Merle, MD.   I have reviewed the above documentation for accuracy and completeness, and I agree with the above.

## 2021-06-18 NOTE — Telephone Encounter (Signed)
Pt called wanting to know the purpose of the endometrial bx that she is scheduled for in January. Advised patient that given history of BRCA and AUB with u/s showing fibroids, it is important to make sure that the uterus is free of any hyperplasia or carcinoma. Pt agreed and verbalized understanding.

## 2021-06-19 ENCOUNTER — Encounter: Payer: Self-pay | Admitting: Hematology

## 2021-06-26 ENCOUNTER — Inpatient Hospital Stay: Payer: 59

## 2021-06-27 ENCOUNTER — Inpatient Hospital Stay: Payer: 59 | Admitting: Hematology

## 2021-07-05 ENCOUNTER — Encounter: Payer: Self-pay | Admitting: Hematology

## 2021-07-10 ENCOUNTER — Ambulatory Visit: Payer: 59

## 2021-07-23 ENCOUNTER — Encounter: Payer: Self-pay | Admitting: Hematology

## 2021-07-30 ENCOUNTER — Other Ambulatory Visit: Payer: Self-pay

## 2021-07-30 ENCOUNTER — Inpatient Hospital Stay: Payer: Medicaid Other | Attending: Hematology

## 2021-07-30 DIAGNOSIS — Z95828 Presence of other vascular implants and grafts: Secondary | ICD-10-CM

## 2021-07-30 DIAGNOSIS — Z853 Personal history of malignant neoplasm of breast: Secondary | ICD-10-CM | POA: Insufficient documentation

## 2021-07-30 DIAGNOSIS — Z452 Encounter for adjustment and management of vascular access device: Secondary | ICD-10-CM | POA: Diagnosis not present

## 2021-07-30 DIAGNOSIS — Z171 Estrogen receptor negative status [ER-]: Secondary | ICD-10-CM

## 2021-07-30 DIAGNOSIS — C50112 Malignant neoplasm of central portion of left female breast: Secondary | ICD-10-CM

## 2021-07-30 LAB — COMPREHENSIVE METABOLIC PANEL
ALT: 17 U/L (ref 0–44)
AST: 18 U/L (ref 15–41)
Albumin: 3.9 g/dL (ref 3.5–5.0)
Alkaline Phosphatase: 145 U/L — ABNORMAL HIGH (ref 38–126)
Anion gap: 7 (ref 5–15)
BUN: 14 mg/dL (ref 6–20)
CO2: 27 mmol/L (ref 22–32)
Calcium: 9.2 mg/dL (ref 8.9–10.3)
Chloride: 104 mmol/L (ref 98–111)
Creatinine, Ser: 0.76 mg/dL (ref 0.44–1.00)
GFR, Estimated: >60 mL/min (ref >60)
Glucose, Bld: 141 mg/dL — ABNORMAL HIGH (ref 70–99)
Potassium: 4 mmol/L (ref 3.5–5.1)
Sodium: 138 mmol/L (ref 135–145)
Total Bilirubin: 0.5 mg/dL (ref 0.3–1.2)
Total Protein: 7.2 g/dL (ref 6.5–8.1)

## 2021-07-30 LAB — CBC WITH DIFFERENTIAL/PLATELET
Abs Immature Granulocytes: 0.02 10*3/uL (ref 0.00–0.07)
Basophils Absolute: 0 10*3/uL (ref 0.0–0.1)
Basophils Relative: 1 %
Eosinophils Absolute: 0.1 10*3/uL (ref 0.0–0.5)
Eosinophils Relative: 1 %
HCT: 36.1 % (ref 36.0–46.0)
Hemoglobin: 12 g/dL (ref 12.0–15.0)
Immature Granulocytes: 0 %
Lymphocytes Relative: 9 %
Lymphs Abs: 0.6 10*3/uL — ABNORMAL LOW (ref 0.7–4.0)
MCH: 30.8 pg (ref 26.0–34.0)
MCHC: 33.2 g/dL (ref 30.0–36.0)
MCV: 92.6 fL (ref 80.0–100.0)
Monocytes Absolute: 0.5 10*3/uL (ref 0.1–1.0)
Monocytes Relative: 7 %
Neutro Abs: 5.2 10*3/uL (ref 1.7–7.7)
Neutrophils Relative %: 82 %
Platelets: 341 10*3/uL (ref 150–400)
RBC: 3.9 MIL/uL (ref 3.87–5.11)
RDW: 15.8 % — ABNORMAL HIGH (ref 11.5–15.5)
WBC: 6.4 10*3/uL (ref 4.0–10.5)
nRBC: 0 % (ref 0.0–0.2)

## 2021-07-30 MED ORDER — SODIUM CHLORIDE 0.9% FLUSH
10.0000 mL | Freq: Once | INTRAVENOUS | Status: AC
  Administered 2021-07-30: 10 mL

## 2021-07-30 MED ORDER — HEPARIN SOD (PORK) LOCK FLUSH 100 UNIT/ML IV SOLN
500.0000 [IU] | Freq: Once | INTRAVENOUS | Status: AC
  Administered 2021-07-30: 500 [IU]

## 2021-07-30 NOTE — Progress Notes (Signed)
Pt was here for port flush appt. CBC and Cmet were drawn so she can have results faxed to her dermatologist. Tammi Sou RN aware.

## 2021-07-31 ENCOUNTER — Ambulatory Visit: Payer: Medicaid Other | Admitting: Obstetrics & Gynecology

## 2021-07-31 ENCOUNTER — Other Ambulatory Visit (HOSPITAL_COMMUNITY)
Admission: RE | Admit: 2021-07-31 | Discharge: 2021-07-31 | Disposition: A | Discharge status: Home / Self Care | Payer: Medicaid Other | Source: Ambulatory Visit | Attending: Obstetrics & Gynecology | Admitting: Obstetrics & Gynecology

## 2021-07-31 VITALS — BP 129/84 | HR 102 | Wt 230.0 lb

## 2021-07-31 DIAGNOSIS — D259 Leiomyoma of uterus, unspecified: Secondary | ICD-10-CM

## 2021-07-31 DIAGNOSIS — N950 Postmenopausal bleeding: Secondary | ICD-10-CM

## 2021-07-31 DIAGNOSIS — Z853 Personal history of malignant neoplasm of breast: Secondary | ICD-10-CM

## 2021-07-31 NOTE — Progress Notes (Signed)
Patient ID: Caitlyn Hendricks, female   DOB: [birth year], 41 y.o.   MRN: [id number]  Chief Complaint  Patient presents with   Gynecologic Exam    HPI Caitlyn Hendricks is a 41 y.o. female.  No obstetric history on file. Patient's last menstrual period was 05/30/2021. Patient says she can feel he fibroids and has had some postmenopausal bleeding. No current bleeding. S/p mastectomy, chemotherapy for breast cancer. She would like to have a hysterectomy and wants to consider minimally invasive approach. Endometrial biopsy is scheduled today. HPI  Past Medical History:  Diagnosis Date   Anemia    Anxiety    Arthritis    Cancer ([facility])    Left Breast   Depression    GERD (gastroesophageal reflux disease)    Headache    History of radiation therapy 10/30/20-12/11/20   left chest wall and axilla - Dr. Gery Pray   Panic attack     Past Surgical History:  Procedure Laterality Date   MASTECTOMY MODIFIED RADICAL Left 09/20/2020   Procedure: LEFT MODIFIED RADICAL MASTECTOMY;  Surgeon: [name], MD;  Location: [facility];  Service: General;  Laterality: Left;  RNFA; [place];   PORTACATH PLACEMENT Right 03/21/2020   Procedure: INSERTION PORT-A-CATH WITH ULTRASOUND GUIDANCE;  Surgeon: [name], MD;  Location: [facility];  Service: General;  Laterality: Right;    Family History  Problem Relation Age of Onset   Cancer Father        prostate cancer    Cancer Maternal Grandmother 68       breast cancer    Social History Social History   Tobacco Use   Smoking status: Never   Smokeless tobacco: Former  Scientific laboratory technician Use: Never used  Substance Use Topics   Alcohol use: Not Currently    Comment: bing drink for last year, usually liquor    Drug use: Yes    Frequency: 7.0 times per week    Types: Marijuana    Comment: 2 puffs a day    Allergies  Allergen Reactions   Medroxyprogesterone Other (See Comments)    Heavy Bleeding Heavy Bleeding    Xeloda  [Capecitabine] Rash    Current Outpatient Medications  Medication Sig Dispense Refill   ALPRAZolam (XANAX) 1 MG tablet Take 1 mg by mouth 3 (three) times daily as needed for anxiety.      baclofen (LIORESAL) 10 MG tablet Take 1 tablet (10 mg total) by mouth 2 (two) times daily as needed for muscle spasms. (Patient not taking: Reported on 06/13/2021) 30 each 0   capecitabine (XELODA) 500 MG tablet Take 3 tablets (1,500 mg total) by mouth every 12 (twelve) hours. (Patient not taking: Reported on 06/13/2021) 28 tablet 0   citalopram (CELEXA) 20 MG tablet Take 20 mg by mouth daily.     diphenhydrAMINE (BENADRYL) 25 mg capsule Take 25 mg by mouth every 6 (six) hours as needed for allergies. (Patient not taking: Reported on 06/13/2021)     diphenhydrAMINE HCl (ZZZQUIL) 50 MG/30ML LIQD Take 30 mLs by mouth at bedtime as needed (sleep). (Patient not taking: Reported on 06/13/2021)     fluticasone (FLONASE) 50 MCG/ACT nasal spray Place 2 sprays into both nostrils daily as needed for allergies or rhinitis. (Patient not taking: Reported on 06/13/2021)     hydroxychloroquine (PLAQUENIL) 200 MG tablet Take 200 mg by mouth 2 (two) times daily.     ibuprofen (ADVIL) 200 MG tablet Take 800 mg by mouth  every 8 (eight) hours as needed for moderate pain.     montelukast (SINGULAIR) 10 MG tablet TAKE 1 TABLET BY MOUTH EVERYDAY AT BEDTIME (Patient not taking: Reported on 06/13/2021) 30 tablet 0   triamcinolone ointment (KENALOG) 0.5 % Apply 1 application topically 2 (two) times daily. (Patient not taking: Reported on 06/13/2021) 30 g 2   No current facility-administered medications for this visit.   Facility-Administered Medications Ordered in Other Visits  Medication Dose Route Frequency Provider Last Rate Last Admin   sodium chloride flush (NS) 0.9 % injection 10 mL  10 mL Intravenous PRN Alla Feeling, NP   10 mL at 03/28/20 1104    Review of Systems Review of Systems  Respiratory: Negative.    Cardiovascular:  Negative.   Gastrointestinal: Negative.  Negative for abdominal distention.  Genitourinary:  Positive for pelvic pain. Negative for vaginal bleeding and vaginal discharge.   Blood pressure 129/84, pulse (!) 102, weight 230 lb (104.3 kg), last menstrual period 05/30/2021.  Physical Exam Physical Exam Vitals and nursing note reviewed. Exam conducted with a chaperone present.  Constitutional:      Appearance: She is obese.  Pulmonary:     Effort: Pulmonary effort is normal.  Genitourinary:    General: Normal vulva.     Exam position: Lithotomy position.     Vagina: Normal. No bleeding.     Cervix: Normal.  Musculoskeletal:        General: Normal range of motion.  Neurological:     General: No focal deficit present.     Mental Status: She is alert.   Patient given informed consent, signed copy in the chart, time out was performed. Appropriate time out taken. . The patient was placed in the lithotomy position and the cervix brought into view with sterile speculum.  Portio of cervix cleansed x 2 with betadine swabs.  A tenaculum was placed in the anterior lip of the cervix.  The uterus was sounded for depth of 8 cm. A pipelle was introduced to into the uterus, suction created,  and an endometrial sample was obtained. All equipment was removed and accounted for.  The patient tolerated the procedure well.    Patient given post procedure instructions. Data Reviewed Narrative & Impression  CLINICAL DATA:  41 year old female with history of breast cancer with lymphadenopathy. Originally diagnosed 02/23/2020. Patient has yet to undergo treatment.   EXAM: CT CHEST, ABDOMEN, AND PELVIS WITH CONTRAST   TECHNIQUE: Multidetector CT imaging of the chest, abdomen and pelvis was performed following the standard protocol during bolus administration of intravenous contrast.   CONTRAST:  158mL OMNIPAQUE IOHEXOL 300 MG/ML  SOLN   COMPARISON:  No priors.   FINDINGS: CT CHEST FINDINGS    Cardiovascular: Heart size is normal. There is no significant pericardial fluid, thickening or pericardial calcification. No atherosclerotic calcifications in the thoracic aorta or the coronary arteries.   Mediastinum/Nodes: No pathologically enlarged mediastinal or hilar lymph nodes. Several prominent but nonenlarged left internal mammary lymph nodes are noted, nonspecific but suspicious given the known left-sided breast cancer. Esophagus is unremarkable in appearance. Extensive left axillary and subpectoral lymphadenopathy, with the largest left axillary lymph node measuring up to 2.6 cm in short axis (axial image 17 of series 2). Mildly enlarged left supraclavicular lymph node (axial image 6 of series 2) measuring 1 cm in short axis.   Lungs/Pleura: No suspicious appearing pulmonary nodules or masses are noted. No acute consolidative airspace disease. No pleural effusions.   Musculoskeletal: Diffuse skin  thickening in the left breast. There is asymmetry of the fibroglandular tissues in the left breast, although a discrete mass is not confidently identified by CT imaging on today's examination. There are no aggressive appearing lytic or blastic lesions noted in the visualized portions of the skeleton.   CT ABDOMEN PELVIS FINDINGS   Hepatobiliary: No suspicious cystic or solid hepatic lesions. No intra or extrahepatic biliary ductal dilatation. Gallbladder is normal in appearance.   Pancreas: No pancreatic mass. No pancreatic ductal dilatation. No pancreatic or peripancreatic fluid collections or inflammatory changes.   Spleen: Unremarkable.   Adrenals/Urinary Tract: Bilateral kidneys and adrenal glands are normal in appearance. No hydroureteronephrosis. Urinary bladder is normal in appearance.   Stomach/Bowel: Normal appearance of the stomach. No pathologic dilatation of small bowel or colon. Normal appendix.   Vascular/Lymphatic: No significant atherosclerotic  disease, aneurysm or dissection noted in the abdominal or pelvic vasculature. No lymphadenopathy noted in the abdomen or pelvis.   Reproductive: Large heterogeneously enhancing mass in the anterior aspect of the anatomic pelvis (axial image 111 of series 2 and sagittal image 107 of series 6) measuring 8.9 x 6.4 x 5.3 cm. It is uncertain whether not this represents an exophytic fibroid, or the lesion arising from the right adnexa. In the left adnexa there is a well-defined 4.2 x 3.3 x 3.6 cm low-attenuation lesion, likely to represent a small cyst.   Other: No significant volume of ascites.  No pneumoperitoneum.   Musculoskeletal: There are no aggressive appearing lytic or blastic lesions noted in the visualized portions of the skeleton.   IMPRESSION: 1. Diffuse skin thickening in the left breast with left axillary and subpectoral lymphadenopathy, as well as a mildly enlarged left supraclavicular lymph node, which likely represents metastatic lymphadenopathy. No other definite extra nodal metastatic disease noted elsewhere in the chest, abdomen or pelvis. 2. Large mass in the central anatomic pelvis which is of uncertain origin, potentially a large exophytic fibroid or a large solid mass arising from the right ovary. Further evaluation with pelvic ultrasound is strongly recommended.     Electronically Signed   By: Vinnie Langton M.D.   On: 03/17/2020 08:32      Assessment Uterine leiomyoma, unspecified location - Plan: Surgical pathology( [facility]) Postmenopausal bleeding  Plan I made a referral for consult for possible RATH to Dr. Lennette Bihari at [facility]. Await the biopsy result    Emeterio Reeve 07/31/2021, 1:00 PM

## 2021-08-01 ENCOUNTER — Other Ambulatory Visit: Payer: Self-pay

## 2021-08-01 LAB — SURGICAL PATHOLOGY

## 2021-08-15 ENCOUNTER — Other Ambulatory Visit: Payer: Self-pay

## 2021-08-15 ENCOUNTER — Telehealth: Payer: Self-pay

## 2021-08-15 NOTE — Telephone Encounter (Signed)
Pt called stating her right breast if purple in color and she has swelling above her previous incision site.  Pt stated she has Lupus and it might be a Lupus flare-up but would like to be seen by Dr. [name] to make sure something else is not going on.  Pt stated the site is hot to touch but denied pain or fevers.  Pt is scheduled to see Dr. [name] in March for f/u appt but would like to be seen before then d/t current new symptoms.  Informed pt that this RN will notify Dr. [name] of her concerns.

## 2021-08-16 ENCOUNTER — Telehealth: Payer: Self-pay | Admitting: Hematology

## 2021-08-16 NOTE — Telephone Encounter (Signed)
Rescheduled upcoming appointment per 2/8 patient call message. Patient is aware of changes.

## 2021-08-20 NOTE — Progress Notes (Addendum)
[place]   Telephone:([phone]) [phone] Fax:(336) [phone]   Clinic Follow up Note   Patient Care Team: [name], MD as PCP - General (Family Medicine) Mauro Kaufmann, RN as Oncology Nurse Navigator Rockwell Germany, RN as Oncology Nurse Navigator [name], MD as Consulting Physician (General Surgery) Truitt Merle, MD as Consulting Physician (Hematology) Gery Pray, MD as Consulting Physician (Radiation Oncology)  Date of Service:  08/21/2021  CHIEF COMPLAINT: bilateral breast skin change   CURRENT THERAPY:  Surveillance  ASSESSMENT & PLAN:  Caitlyn Hendricks is a 41 y.o. female with   1. 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 Mammogram/US on 02/23/20 showed 2 adjacent left breast masses at 12 o'clock spanning 3.6cm and multiple enlarged left axillary (at least 5) and left subpectoral LNs. Her 03/08/20 biopsy revealed grade III invasive ductal carcinoma, triple negative, metastatic to her left axillary LN. CT CAP/Bone scan negative for distant mets.  -She completed neoadjuvant chemo with ddAC q2weeks for 4 cycles 03/22/20-05/03/20. Followed by weekly carbo/taxol for 12 weeks 05/17/20-08/16/20 before proceeding with surgery.  -she started Ballard [facility] for 1 year treatment on 04/28/20, but stopped in 10/2020 due to skin rash and arthralgia.   -She underwent left mastectomy with Dr Donne Hazel on 09/20/20. Surgical path shows No residual invasive carcinoma in breast status post neoadjuvant treatment, however has 7/19 positive LN.  -she began concurrent chemoRT with oral Xeloda on 10/30/20 and completed radiation on 12/11/20. She developed a rash from Xeloda, and this was held from 5/16-6/27/22. When this was restarted, she again developed a rash and joint pain. It was switched to 5-FU on 01/18/21 for 4 cycles.  During that time, she was diagnosed with Lupus and put on medication. With the rash under control, she switched back to Xeloda and completed 6 months of treatment in 05/2021.  -given her triple negative disease, she is now on observation. -restaging CT CAP 06/15/21 showed no evidence of metastatic disease. -Exam showed diffuse skin erythema on the left chest wall with some small nodularity, although this could be all related to her lupus and radiation scar change, I recommend her to get a skin punch biopsy from her dermatologist to rule out cancer recurrence -We will also obtain PET scan in the next 3 to 4 weeks to rule out recurrence.  2. Cutaneous Lupus -She had several episodes of rash since she started chemo treatment; skin biopsy showed a drug-related dermatitis. The rash persisted on Keytruda, Xeloda, and 5-FU. -she underwent lab work with her dermatologist on 02/27/21. Results revealed cutaneous Lupus. She is now on medication for this. -since being on the medication for Lupus, her rash has greatly improved. -She again developed severe diffuse skin erythema after she ran out of Plaquenil last months, skin change has not improved since she restarted Plaquenil 2 to 3 weeks ago. -I encouraged her to follow-up with her dermatologist sooner, and to get a skin biopsy   3. Anxiety, Social support, PTSD -She has a history of anxiety and has been on Xanax 40m TID and Celexa 471m -She has very good social support from mother and boyfriend. 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 -she [id number] and hopes to get a PET scan.   4. Genetic Testing negative for pathogenetic mutations with VUS of POLE  gene   5. COVID (+) on 08/02/20 with symptoms of Hoarseness and ribcage pain. She has recovered quickly and completely       PLAN: -She will call her dermatologist to move her next follow-up appointment to 2 weeks, and get a skin biopsy of the left  chest wall if skin erythema and nodularity does not improve -phone visit in 4 weeks with lab and PET scan a few days before -Referral to behavioral health for her anxiety and PTSD treatment   No problem-specific Assessment & Plan notes found for this encounter.   SUMMARY OF ONCOLOGIC HISTORY: Oncology History Overview Note  Cancer Staging Cancer of central portion of left female breast [facility]) Staging form: Breast, AJCC 8th Edition - Clinical stage from 03/08/2020: Stage IIIB (cT2, cN1, cM0, G3, ER-, PR-, HER2-) - Signed by Truitt Merle, MD on 03/10/2020 Stage prefix: Initial diagnosis Histologic grading system: 3 grade system - Pathologic stage from 09/20/2020: No Stage Recommended (ypT0, pN2a, cM0, G3, ER-, PR-, HER2-) - Signed by Gardenia Phlegm, NP on 10/04/2020 Stage prefix: Post-therapy Histologic grading system: 3 grade system    Cancer of central portion of left female breast ([place])  02/23/2020 Mammogram   Diagnostic Mammogram 02/23/20  IMPRESSION The 2x1x2.6cm irregular mass in teh left breast at 12:00 posiiton middle depth is highly suspicious of malignancy. An [place] is recommended for further evaluation and biopsy planning purposes.   02/23/2020 Breast US   [name] 02/23/20  IMPRESSION 2 adjacent spiculated masses in the left brast at 12:00 position 3 cm from the nipple (2.1x0.9x1.1cm and 1.1x1.4x0.5cm) is suggestive of malignancy.   Multiple abnormal left subpectoral and left axillary nodes measuring 3.3x2.1 cm concerning for metastatic adenopathy.    Left breast skin thickening and edema may be secondary congestive edema due to extensive axillary adenopathy.    03/08/2020 Initial Biopsy   Diagnosis 1.Breast, left, needle core biopsy, 12:00 position, 3cmfn -INVASIVE DUCTAL CARCINOMA -SEE COMMENT  2. Lymph node, needle/core biopsy, left axilla -METASTATIC CARCINOMA INVOLVING A LYMPH NODE  -LYMPHOVASCULAR SPACE INVASION PRESENT   Microscopic Comment  1.Based on  the biopsy the carcinoma appears Nottingham Grade 3 or 3 and measures 1 cm in the greatest linear extent.    03/08/2020 Receptors her2   ER- Negative 0% PR - Negative 0% HER2 - Negative  KI 67 - 80%    03/08/2020 Cancer Staging   Staging form: Breast, AJCC 8th Edition - Clinical stage from 03/08/2020: Stage IIIB (cT2, cN1, cM0, G3, ER-, PR-, HER2-) - Signed by Truitt Merle, MD on 03/10/2020    03/10/2020 Initial Diagnosis   Cancer of central portion of left female breast ([place])   03/16/2020 Breast MRI   IMPRESSION: 1. 8.1 x 7.8 x 6.6 cm biopsy proven invasive ductal carcinoma in the central right breast, involving 3 quadrants. 2. 3.0 x 1.7 x 1.1 cm satellite mass more inferiorly in lower inner quadrant of the left breast, compatible with additional malignancy. 3. Metastatic level 1 and level 2 left axillary lymph nodes. 4. No evidence of malignancy on the right.   03/17/2020 Imaging   IMPRESSION: CT CAP w contrast  1. Diffuse skin thickening in the left breast with left axillary and subpectoral lymphadenopathy, as well as a mildly enlarged left supraclavicular lymph node, which likely represents metastatic lymphadenopathy. No other definite extra nodal metastatic disease noted elsewhere in the chest, abdomen or pelvis. 2. Large mass in the central anatomic pelvis which is of uncertain origin, potentially a large exophytic  fibroid or a large solid mass arising from the right ovary. Further evaluation with pelvic ultrasound is strongly recommended.   03/21/2020 Imaging   Bone Scan  IMPRESSION: Apparent arthropathy at L5. No bony metastatic disease is demonstrable on this study. Scattered foci of abnormal uptake in a pelvic mass is of uncertain etiology given absence of calcification in this mass by CT. This mass compresses the urinary bladder. It is possible that some of the increased uptake in this area actually represents physiologic uptake within the bladder.   Kidneys noted in flank  positions bilaterally.     03/22/2020 - 08/16/2020 Neo-Adjuvant Chemotherapy   Neoadjuvant Adriamycin and Cytoxan q2weeks for 4 cycles starting 03/22/20-05/03/20 followed by weekly Taxol and Carboplatin for 12 weeks starting 05/17/20-08/16/20   03/24/2020 Imaging   US Pelvis  IMPRESSION: 1. Large pedunculated lesion directly contiguous with the uterine most suggestive of a large subserosal fibroid which measures up to 8.2 cm. 2. Additional 1 cm probable intramural fibroid in the right anterior uterine body. 3. No other acute or worrisome pelvic abnormality.   03/29/2020 Genetic Testing   Negative genetic testing on the common hereditary cancer panel.  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  The Common Hereditary Gene Panel offered by [facility] includes sequencing and/or deletion duplication testing of the following 47 genes: APC, ATM, AXIN2, BARD1, BMPR1A, BRCA1, BRCA2, BRIP1, CDH1, CDK4, CDKN2A (p14ARF), CDKN2A (p16INK4a), CHEK2, CTNNA1, DICER1, EPCAM (Deletion/duplication testing only), GREM1 (promoter region deletion/duplication testing only), KIT, MEN1, MLH1, MSH2, MSH3, MSH6, MUTYH, NBN, NF1, NHTL1, PALB2, PDGFRA, PMS2, POLD1, POLE, PTEN, RAD50, RAD51C, RAD51D, SDHB, SDHC, SDHD, SMAD4, SMARCA4. STK11, TP53, TSC1, TSC2, and VHL.  The following genes were evaluated for sequence changes only: SDHA and HOXB13 c.251G>A variant only. The report date is 03/29/2020   04/28/2020 - 10/27/2020 Antibody Plan   Added Keytruda q3weeks starting 04/28/20 to complete 1 year of treatment. Held since 10/27/20 due to skin rash and body aches.    08/17/2020 Breast MRI   IMPRESSION: 1. The biopsy proven malignancy in the left breast and the adjacent satellite lesion have resolved in the interval. No abnormalities in these locations today. 2. No MRI evidence of malignancy in the right breast. 3. No adenopathy identified today   09/20/2020 Surgery   LEFT MODIFIED RADICAL MASTECTOMY by Dr Donne Hazel   09/20/2020 Pathology  Results   FINAL MICROSCOPIC DIAGNOSIS:   A. BREAST, LEFT, MODIFIED RADICAL MASTECTOMY:  - No residual invasive carcinoma in breast status post neoadjuvant  treatment.  - Metastatic carcinoma in (2) of (14) lymph nodes.  - Biopsy sites (one in breast, one in one of the positive lymph nodes).  - See oncology table.   B. ADDITIONAL AXILLARY CONTENTS, LEFT, DISSECTION:  - Metastatic carcinoma in (5) of (5) lymph nodes.    09/20/2020 Cancer Staging   Staging form: Breast, AJCC 8th Edition - Pathologic stage from 09/20/2020: No Stage Recommended (ypT0, pN2a, cM0, G3, ER-, PR-, HER2-) - Signed by Gardenia Phlegm, NP on 10/04/2020 Stage prefix: Post-therapy Histologic grading system: 3 grade system    10/30/2020 - 12/11/2020 Radiation Therapy   Adjuvant Radiation with Dr Sondra Come starting 10/30/20   10/30/2020 -  Chemotherapy   Adjuvant Xeloda starting 10/30/20 at 1546m BID M-F while on RT. Held since 11/20/2020 due to recurrent skin rash   01/18/2021 -  Chemotherapy    Patient is on Treatment Plan: COLORECTAL 5FU / LEUCOVORIN MODIFIED DEGRAMONT Q14D X 12 CYCLES       03/28/2021 Imaging  CT CAP  IMPRESSION: 1. Status post left modified radical mastectomy and left axillary lymph node dissection, with no definitive findings to suggest metastatic disease in the chest, abdomen or pelvis on today's examination. Developing postradiation fibrosis in the apex of the left upper lobe is new compared to the prior study, but typical in the setting of prior axillary radiation therapy. 2. There appears to be 2 lesions in the uterus, one of which appears slightly enlarged compared to the prior study, while the largest lesion on the prior examination has partially regressed, as detailed above. 3. Hepatic steatosis.   06/15/2021 Imaging   EXAM: CT CHEST, ABDOMEN, AND PELVIS WITH CONTRAST  IMPRESSION: 1. Stable exam. No evidence for metastatic disease in the chest, abdomen, or pelvis. 2.  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 3. Uterine fibroids.      INTERVAL HISTORY:  Caitlyn Hendricks is here for a follow up of breast cancer. She was last seen by me on 06/18/21. She presents to the clinic alone.  She called last week and requested an appointment due to her concern of skin changes on her bilateral breast.  She ran out of Plaquenil last months for 2 weeks, and she developed diffuse skin erythema at the mastectomy site of left chest wall, she also noticed some small nodularities on chest wall.  Meantime she noticed stretch markers in the right breast and abdomen, she denies any pain, edema, or other new symptoms.  As usual, she is very anxious and concerned.  She requested to be seen today because of new breast changes.   All other systems were reviewed with the patient and are negative.  MEDICAL HISTORY:  Past Medical History:  Diagnosis Date   Anemia    Anxiety    Arthritis    Cancer ([place])    Left Breast   Depression    GERD (gastroesophageal reflux disease)    Headache    History of radiation therapy 10/30/20-12/11/20   left chest wall and axilla - Dr. Gery Pray   Panic attack     SURGICAL HISTORY: Past Surgical History:  Procedure Laterality Date   MASTECTOMY MODIFIED RADICAL Left 09/20/2020   Procedure: LEFT MODIFIED RADICAL MASTECTOMY;  Surgeon: [name], MD;  Location: [place];  Service: General;  Laterality: Left;  RNFA; PEC BLOCK;   PORTACATH PLACEMENT Right 03/21/2020   Procedure: INSERTION PORT-A-CATH WITH ULTRASOUND GUIDANCE;  Surgeon: [name], MD;  Location: [place];  Service: General;  Laterality: Right;    I have reviewed the social history and family history with the patient and they are unchanged from previous note.  ALLERGIES:  is allergic to medroxyprogesterone and xeloda [capecitabine].  MEDICATIONS:  Current Outpatient Medications  Medication Sig Dispense Refill   ALPRAZolam  (XANAX) 1 MG tablet Take 1 mg by mouth 3 (three) times daily as needed for anxiety.      baclofen (LIORESAL) 10 MG tablet Take 1 tablet (10 mg total) by mouth 2 (two) times daily as needed for muscle spasms. (Patient not taking: Reported on 06/13/2021) 30 each 0   capecitabine (XELODA) 500 MG tablet Take 3 tablets (1,500 mg total) by mouth every 12 (twelve) hours. (Patient not taking: Reported on 06/13/2021) 28 tablet 0   citalopram (CELEXA) 20 MG tablet Take 20 mg by mouth daily.     diphenhydrAMINE (BENADRYL) 25 mg capsule Take 25 mg by mouth every 6 (six) hours as needed for allergies. (Patient not taking: Reported  on 06/13/2021)     diphenhydrAMINE HCl (ZZZQUIL) 50 MG/30ML LIQD Take 30 mLs by mouth at bedtime as needed (sleep). (Patient not taking: Reported on 06/13/2021)     fluticasone (FLONASE) 50 MCG/ACT nasal spray Place 2 sprays into both nostrils daily as needed for allergies or rhinitis. (Patient not taking: Reported on 06/13/2021)     hydroxychloroquine (PLAQUENIL) 200 MG tablet Take 200 mg by mouth 2 (two) times daily.     ibuprofen (ADVIL) 200 MG tablet Take 800 mg by mouth every 8 (eight) hours as needed for moderate pain.     montelukast (SINGULAIR) 10 MG tablet TAKE 1 TABLET BY MOUTH EVERYDAY AT BEDTIME (Patient not taking: Reported on 06/13/2021) 30 tablet 0   triamcinolone ointment (KENALOG) 0.5 % Apply 1 application topically 2 (two) times daily. (Patient not taking: Reported on 06/13/2021) 30 g 2   No current facility-administered medications for this visit.   Facility-Administered Medications Ordered in Other Visits  Medication Dose Route Frequency Provider Last Rate Last Admin   sodium chloride flush (NS) 0.9 % injection 10 mL  10 mL Intravenous PRN Alla Feeling, NP   10 mL at 03/28/20 1104    PHYSICAL EXAMINATION: ECOG PERFORMANCE STATUS: 1 - Symptomatic but completely ambulatory  Vitals:   08/21/21 1024  BP: 139/80  Pulse: 96  Resp: 18  Temp: 98.4 F (36.9 C)  SpO2:  100%   Wt Readings from Last 3 Encounters:  08/21/21 227 lb 8 oz (103.2 kg)  07/31/21 230 lb (104.3 kg)  06/18/21 229 lb 1.6 oz (103.9 kg)     GENERAL:alert, no distress and comfortable SKIN: skin color, texture, turgor are normal, no rashes or significant lesions EYES: normal, Conjunctiva are pink and non-injected, sclera clear NECK: supple, thyroid normal size, non-tender, without nodularity LYMPH:  no palpable lymphadenopathy in the cervical, axillary  LUNGS: clear to auscultation and percussion with normal breathing effort HEART: regular rate & rhythm and no murmurs and no lower extremity edema ABDOMEN:abdomen soft, non-tender and normal bowel sounds Musculoskeletal:no cyanosis of digits and no clubbing  NEURO: alert & oriented x 3 with fluent speech, no focal motor/sensory deficits BREAST: Diffuse confluent skin erythema at the mastectomy site of left chest wall and left axilla, with diffuse small nodularity in the medial part chest all, a tiny skin peeling, no open wound or ulcers.  no palpable mass, nodules or adenopathy bilaterally. Right breast exam benign, except a few stretch markers on the lateral of right breast  LABORATORY DATA:  I have reviewed the data as listed CBC Latest Ref Rng & Units 08/21/2021 07/30/2021 06/15/2021  WBC 4.0 - 10.5 K/uL 6.5 6.4 4.3  Hemoglobin 12.0 - 15.0 g/dL 12.9 12.0 11.7(L)  Hematocrit 36.0 - 46.0 % 39.4 36.1 35.4(L)  Platelets 150 - 400 K/uL 366 341 263     CMP Latest Ref Rng & Units 08/21/2021 07/30/2021 06/15/2021  Glucose 70 - 99 mg/dL 87 141(H) 94  BUN 6 - 20 mg/dL 26(H) 14 13  Creatinine 0.44 - 1.00 mg/dL 0.81 0.76 0.82  Sodium 135 - 145 mmol/L 135 138 138  Potassium 3.5 - 5.1 mmol/L 4.2 4.0 3.9  Chloride 98 - 111 mmol/L 101 104 103  CO2 22 - 32 mmol/L 26 27 26   Calcium 8.9 - 10.3 mg/dL 9.5 9.2 9.0  Total Protein 6.5 - 8.1 g/dL 7.6 7.2 7.0  Total Bilirubin 0.3 - 1.2 mg/dL 0.3 0.5 0.8  Alkaline Phos 38 - 126 U/L 120 145(H) 186(H)  AST  15 - 41 U/L 20 18 19   ALT 0 - 44 U/L 23 17 22       RADIOGRAPHIC STUDIES: I have personally reviewed the radiological images as listed and agreed with the findings in the report. No results found.    Orders Placed This Encounter  Procedures   NM PET Image Initial (PI) Skull Base To Thigh    Standing Status:   Future    Standing Expiration Date:   08/21/2022    Order Specific Question:   If indicated for the ordered procedure, I authorize the administration of a radiopharmaceutical per Radiology protocol    Answer:   Yes    Order Specific Question:   Is the patient pregnant?    Answer:   No    Order Specific Question:   Preferred imaging location?    Answer:   [facility]    Order Specific Question:   Release to patient    Answer:   Manual release only    Order Specific Question:   Reason for preventing immediate release    Answer:   Patient request [1]    Order Specific Question:   Additional details for preventing immediate release    Answer:   due to anxiety   CMP ([place] only)   Ambulatory referral to Psychiatry    Referral Priority:   Urgent    Referral Type:   Psychiatric    Referral Reason:   Specialty Services Required    Requested Specialty:   Psychiatry    Number of Visits Requested:   1   All questions were answered. The patient knows to call the clinic with any problems, questions or concerns. No barriers to learning was detected. The total time spent in the appointment was 40 minutes.     Truitt Merle, MD 08/21/2021   I, Wilburn Mylar, am acting as scribe for Truitt Merle, MD.   I have reviewed the above documentation for accuracy and completeness, and I agree with the above.

## 2021-08-21 ENCOUNTER — Inpatient Hospital Stay (HOSPITAL_BASED_OUTPATIENT_CLINIC_OR_DEPARTMENT_OTHER): Payer: Medicaid Other | Admitting: Hematology

## 2021-08-21 ENCOUNTER — Encounter: Payer: Self-pay | Admitting: Hematology

## 2021-08-21 ENCOUNTER — Other Ambulatory Visit: Payer: Self-pay

## 2021-08-21 ENCOUNTER — Inpatient Hospital Stay: Payer: Medicaid Other | Attending: Hematology

## 2021-08-21 VITALS — BP 139/80 | HR 96 | Temp 98.4°F | Resp 18 | Ht 64.0 in | Wt 227.5 lb

## 2021-08-21 DIAGNOSIS — Z95828 Presence of other vascular implants and grafts: Secondary | ICD-10-CM

## 2021-08-21 DIAGNOSIS — F4310 Post-traumatic stress disorder, unspecified: Secondary | ICD-10-CM | POA: Insufficient documentation

## 2021-08-21 DIAGNOSIS — C50112 Malignant neoplasm of central portion of left female breast: Secondary | ICD-10-CM | POA: Insufficient documentation

## 2021-08-21 DIAGNOSIS — Z171 Estrogen receptor negative status [ER-]: Secondary | ICD-10-CM | POA: Insufficient documentation

## 2021-08-21 DIAGNOSIS — F419 Anxiety disorder, unspecified: Secondary | ICD-10-CM | POA: Diagnosis not present

## 2021-08-21 DIAGNOSIS — L930 Discoid lupus erythematosus: Secondary | ICD-10-CM | POA: Diagnosis not present

## 2021-08-21 DIAGNOSIS — C773 Secondary and unspecified malignant neoplasm of axilla and upper limb lymph nodes: Secondary | ICD-10-CM | POA: Diagnosis not present

## 2021-08-21 LAB — CMP (CANCER CENTER ONLY)
ALT: 23 U/L (ref 0–44)
AST: 20 U/L (ref 15–41)
Albumin: 3.9 g/dL (ref 3.5–5.0)
Alkaline Phosphatase: 120 U/L (ref 38–126)
Anion gap: 8 (ref 5–15)
BUN: 26 mg/dL — ABNORMAL HIGH (ref 6–20)
CO2: 26 mmol/L (ref 22–32)
Calcium: 9.5 mg/dL (ref 8.9–10.3)
Chloride: 101 mmol/L (ref 98–111)
Creatinine: 0.81 mg/dL (ref 0.44–1.00)
GFR, Estimated: >60 mL/min (ref >60)
Glucose, Bld: 87 mg/dL (ref 70–99)
Potassium: 4.2 mmol/L (ref 3.5–5.1)
Sodium: 135 mmol/L (ref 135–145)
Total Bilirubin: 0.3 mg/dL (ref 0.3–1.2)
Total Protein: 7.6 g/dL (ref 6.5–8.1)

## 2021-08-21 LAB — CBC WITH DIFFERENTIAL/PLATELET
Abs Immature Granulocytes: 0.03 10*3/uL (ref 0.00–0.07)
Basophils Absolute: 0 10*3/uL (ref 0.0–0.1)
Basophils Relative: 0 %
Eosinophils Absolute: 0.1 10*3/uL (ref 0.0–0.5)
Eosinophils Relative: 1 %
HCT: 39.4 % (ref 36.0–46.0)
Hemoglobin: 12.9 g/dL (ref 12.0–15.0)
Immature Granulocytes: 1 %
Lymphocytes Relative: 8 %
Lymphs Abs: 0.6 10*3/uL — ABNORMAL LOW (ref 0.7–4.0)
MCH: 30.3 pg (ref 26.0–34.0)
MCHC: 32.7 g/dL (ref 30.0–36.0)
MCV: 92.5 fL (ref 80.0–100.0)
Monocytes Absolute: 0.6 10*3/uL (ref 0.1–1.0)
Monocytes Relative: 8 %
Neutro Abs: 5.3 10*3/uL (ref 1.7–7.7)
Neutrophils Relative %: 82 %
Platelets: 366 10*3/uL (ref 150–400)
RBC: 4.26 MIL/uL (ref 3.87–5.11)
RDW: 15.8 % — ABNORMAL HIGH (ref 11.5–15.5)
WBC: 6.5 10*3/uL (ref 4.0–10.5)
nRBC: 0 % (ref 0.0–0.2)

## 2021-08-21 MED ORDER — HEPARIN SOD (PORK) LOCK FLUSH 100 UNIT/ML IV SOLN
500.0000 [IU] | Freq: Once | INTRAVENOUS | Status: AC
  Administered 2021-08-21: 500 [IU]

## 2021-08-21 MED ORDER — SODIUM CHLORIDE 0.9% FLUSH
10.0000 mL | Freq: Once | INTRAVENOUS | Status: AC
  Administered 2021-08-21: 10 mL

## 2021-08-22 ENCOUNTER — Other Ambulatory Visit: Payer: Self-pay | Admitting: Hematology

## 2021-08-22 ENCOUNTER — Telehealth: Payer: Self-pay | Admitting: *Deleted

## 2021-08-22 ENCOUNTER — Telehealth: Payer: Self-pay | Admitting: Hematology

## 2021-08-22 DIAGNOSIS — Z171 Estrogen receptor negative status [ER-]: Secondary | ICD-10-CM

## 2021-08-22 DIAGNOSIS — C50112 Malignant neoplasm of central portion of left female breast: Secondary | ICD-10-CM

## 2021-08-22 NOTE — Telephone Encounter (Signed)
Office notes form 2/14 was faxed to Albertha Ghee at [place] to [phone]. Receipt of confirmation was received

## 2021-08-22 NOTE — Telephone Encounter (Signed)
Scheduled follow-up appointments per 2/14 los. Patient is aware. °

## 2021-08-23 ENCOUNTER — Encounter: Payer: Self-pay | Admitting: Licensed Clinical Social Worker

## 2021-08-23 NOTE — Progress Notes (Signed)
[facility] CSW Progress Note  Clinical Social Worker spoke to patient regarding request for psychiatric follow up. Pt was referred to [facility] at last oncology visit; however, states she has not yet heard from anyone.  Writer contacted [facility] and [facility] (spoke w/ [place]), who confirmed an opening with Dr. Evette Doffing on February 28th for an initial assessment.  Contact information given to patient to register and book appointment.  Writer also spoke with patient about the [facility] program which patient will consider and call if she would like to register.     Henriette Combs , LCSW

## 2021-08-31 ENCOUNTER — Telehealth: Payer: Self-pay

## 2021-08-31 ENCOUNTER — Other Ambulatory Visit: Payer: Self-pay

## 2021-08-31 ENCOUNTER — Inpatient Hospital Stay (HOSPITAL_BASED_OUTPATIENT_CLINIC_OR_DEPARTMENT_OTHER): Payer: Medicaid Other | Admitting: Hematology

## 2021-08-31 DIAGNOSIS — C50112 Malignant neoplasm of central portion of left female breast: Secondary | ICD-10-CM

## 2021-08-31 DIAGNOSIS — Z171 Estrogen receptor negative status [ER-]: Secondary | ICD-10-CM

## 2021-08-31 NOTE — Telephone Encounter (Signed)
Caitlyn Hendricks [phone]  [phone] regardin Case# [id number] read by Dr. Julienne Kass.  Spoke with Dr. Aron Baba nurse [place] to add-on additional test to pt's punch biopsy done [name], NP at [facility] dematologist office in [place]. Add-on a ER/PR & HER2 ([facility]) to pathology.  Requested results to be faxed to Dr. Ernestina Penna office when complete.  Porsche stated she will have them faxed when they have resulted.  Rescheduled pt's PET scan to the week of 09/03/2021.  Awaiting PA and Central Scheduling to schedule the appt.

## 2021-08-31 NOTE — Progress Notes (Signed)
[place]   Telephone:([phone]) [phone] [phone]) [phone]   Clinic Follow up Note   Patient Care Team: [name], MD as PCP - General (Family Medicine) Morrison Old, NP as PCP - Dermatology (Nurse Practitioner) Mauro Kaufmann, RN as Oncology Nurse Navigator Rockwell Germany, RN as Oncology Nurse Navigator [name], MD as Consulting Physician (General Surgery) Truitt Merle, MD as Consulting Physician (Hematology) Gery Pray, MD as Consulting Physician (Radiation Oncology)  Date of Service:  08/31/2021  I connected with Caitlyn Hendricks on 08/31/2021 at  3:40 PM EST by telephone visit and verified that I am speaking with the correct person using two identifiers.  I discussed the limitations, risks, security and privacy concerns of performing an evaluation and management service by telephone and the availability of in person appointments. I also discussed with the patient that there may be a patient responsible charge related to this service. The patient expressed understanding and agreed to proceed.   Other persons participating in the visit and their role in the encounter:  none  Patient's location:  home Provider's location:  my office  CHIEF COMPLAINT: review skin biopsy results  CURRENT THERAPY:  Pending  ASSESSMENT & PLAN:  Caitlyn Hendricks is a 41 y.o. female with   1. 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 Mammogram/US on 02/23/20 showed 2 adjacent left breast masses at 12 o'clock spanning 3.6cm and multiple enlarged left axillary (at least 5) and left subpectoral LNs. Her 03/08/20 biopsy revealed grade III invasive ductal carcinoma, triple negative, metastatic to her left axillary LN. CT CAP/Bone scan negative for distant mets.  -She completed neoadjuvant  chemo with ddAC q2weeks for 4 cycles 03/22/20-05/03/20. Followed by weekly carbo/taxol for 12 weeks 05/17/20-08/16/20 before proceeding with surgery.  -she started Ballard Russell for 1 year treatment on 04/28/20, but stopped in 10/2020 due to skin rash and arthralgia.   -She underwent left mastectomy with Dr Donne Hazel on 09/20/20. Surgical path shows No residual invasive carcinoma in breast status post neoadjuvant treatment, however has 7/19 positive LN.  -she began concurrent chemoRT with oral Xeloda on 10/30/20 and completed radiation on 12/11/20. She developed a rash from Xeloda, and this was held from 5/16-6/27/22. When this was restarted, she again developed a rash and joint pain. It was switched to 5-FU on 01/18/21 for 4 cycles. During that time, she was diagnosed with Lupus and put on medication. With the rash under control, she switched back to Xeloda and completed 6 months of treatment in 05/2021.  -given her triple negative disease, she is now on observation. -restaging CT CAP 06/15/21 showed no evidence of metastatic disease. -she developed skin erythema with nodularity to left chest wall. Punch biopsy of the area on 08/28/21 revealed infiltrating carcinoma, consistent with breast origin. We discussed these results today. I will order prognostic panel ER/PR/HER2  -I discussed that surgery may not be an option given the diffuse chest wall recurrence, her treatment will be mainly systemic therapy  -She is currently scheduled for PET scan on 09/13/21; we will try to move this up given the biopsy findings. -Her previous breast primary showed low HER2 expression (1+ by IHC), if her skin biopsy also showed HER2 expression (1+ OR 2+), I would recommend Enhertu as her next line treatment, if HER2 IHC negative, I would  recommend Sacituzumab govitecan, based on the NCCN guideline.  I briefly discussed the benefit and potential side effects with her.   2. Cutaneous Lupus -She had several episodes of rash since she  started chemo treatment; skin biopsy showed a drug-related dermatitis. The rash persisted on Keytruda, Xeloda, and 5-FU. -she underwent lab work with her dermatologist on 02/27/21. Results revealed cutaneous Lupus. She is now on medication for this. -since being on the medication for Lupus, her rash has greatly improved.   3. Anxiety, Social support, PTSD -She has a history of anxiety and has been on Xanax 82m TID and Celexa 456m -I referred her to behavioral health on 08/21/21; she is scheduled 09/04/21 -She has very good social support from mother and boyfriend. 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 [id number]   4. Genetic Testing negative for pathogenetic mutations with VUS of POLE gene   5. COVID (+) on 08/02/20 with symptoms of Hoarseness and ribcage pain. She has recovered quickly and completely       PLAN: -PET scan scheduled for 09/13/21, will try to move sooner -will request ER/PR and HER2 IHC on her recent skin biopsy. If HER2 1+ or 2+, will start her on Enher2, if HER2-, will start on Sacituzumab govitecan -plan to start chemo in 11-2 weeks  -copy Dr. WaDonne Hazel  No problem-specific [place] found for this encounter.   SUMMARY OF ONCOLOGIC HISTORY: Oncology History Overview Note  Cancer Staging Cancer of central portion of left female breast [facility] form: Breast, AJCC 8th Edition - Clinical stage from 03/08/2020: Stage IIIB (cT2, cN1, cM0, G3, ER-, PR-, HER2-) - Signed by FeTruitt MerleMD on 03/10/2020 Stage prefix: Initial diagnosis Histologic grading system: 3 grade system - Pathologic stage from 09/20/2020: No Stage Recommended (ypT0, pN2a, cM0, G3, ER-, PR-, HER2-) - Signed by CaGardenia PhlegmNP on 10/04/2020 Stage prefix: Post-therapy Histologic grading system: 3 grade system    Cancer of central portion of left female breast ([facility] 02/23/2020 Mammogram   Diagnostic Mammogram 02/23/20   IMPRESSION The 2x1x2.6cm irregular mass in teh left breast at 12:00 posiiton middle depth is highly suspicious of malignancy. An [place] recommended for further evaluation and biopsy planning purposes.   02/23/2020 Breast [place] breast 02/23/20  IMPRESSION 2 adjacent spiculated masses in the left brast at 12:00 position 3 cm from the nipple (2.1x0.9x1.1cm and 1.1x1.4x0.5cm) is suggestive of malignancy.   Multiple abnormal left subpectoral and left axillary nodes measuring 3.3x2.1 cm concerning for metastatic adenopathy.    Left breast skin thickening and edema may be secondary congestive edema due to extensive axillary adenopathy.    03/08/2020 Initial Biopsy   Diagnosis 1.Breast, left, needle core biopsy, 12:00 position, 3cmfn -INVASIVE DUCTAL CARCINOMA -SEE COMMENT  2. Lymph node, needle/core biopsy, left axilla -METASTATIC CARCINOMA INVOLVING A LYMPH NODE  -LYMPHOVASCULAR SPACE INVASION PRESENT   Microscopic Comment  1.Based on the biopsy the carcinoma appears Nottingham Grade 3 or 3 and measures 1 cm in the greatest linear extent.    03/08/2020 Receptors her2   ER- Negative 0% PR - Negative 0% HER2 - Negative  KI 67 - 80%    03/08/2020 Cancer Staging   Staging form: Breast, AJCC 8th Edition - Clinical stage from 03/08/2020: Stage IIIB (cT2, cN1, cM0, G3, ER-, PR-, HER2-) - Signed by FeTruitt MerleMD on 03/10/2020    03/10/2020 Initial Diagnosis  Cancer of central portion of left female breast ([place])   03/16/2020 Breast MRI   IMPRESSION: 1. 8.1 x 7.8 x 6.6 cm biopsy proven invasive ductal carcinoma in the central right breast, involving 3 quadrants. 2. 3.0 x 1.7 x 1.1 cm satellite mass more inferiorly in lower inner quadrant of the left breast, compatible with additional malignancy. 3. Metastatic level 1 and level 2 left axillary lymph nodes. 4. No evidence of malignancy on the right.   03/17/2020 Imaging   IMPRESSION: CT CAP w contrast  1. Diffuse skin thickening in the  left breast with left axillary and subpectoral lymphadenopathy, as well as a mildly enlarged left supraclavicular lymph node, which likely represents metastatic lymphadenopathy. No other definite extra nodal metastatic disease noted elsewhere in the chest, abdomen or pelvis. 2. Large mass in the central anatomic pelvis which is of uncertain origin, potentially a large exophytic fibroid or a large solid mass arising from the right ovary. Further evaluation with pelvic ultrasound is strongly recommended.   03/21/2020 Imaging   Bone Scan  IMPRESSION: Apparent arthropathy at L5. No bony metastatic disease is demonstrable on this study. Scattered foci of abnormal uptake in a pelvic mass is of uncertain etiology given absence of calcification in this mass by CT. This mass compresses the urinary bladder. It is possible that some of the increased uptake in this area actually represents physiologic uptake within the bladder.   Kidneys noted in flank positions bilaterally.     03/22/2020 - 08/16/2020 Neo-Adjuvant Chemotherapy   Neoadjuvant Adriamycin and Cytoxan q2weeks for 4 cycles starting 03/22/20-05/03/20 followed by weekly Taxol and Carboplatin for 12 weeks starting 05/17/20-08/16/20   03/24/2020 Imaging   US Pelvis  IMPRESSION: 1. Large pedunculated lesion directly contiguous with the uterine most suggestive of a large subserosal fibroid which measures up to 8.2 cm. 2. Additional 1 cm probable intramural fibroid in the right anterior uterine body. 3. No other acute or worrisome pelvic abnormality.   03/29/2020 Genetic Testing   Negative genetic testing on the common hereditary cancer panel.  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  The Common Hereditary Gene Panel offered by [facility] includes sequencing and/or deletion duplication testing of the following 47 genes: APC, ATM, AXIN2, BARD1, BMPR1A, BRCA1, BRCA2, BRIP1, CDH1, CDK4, CDKN2A (p14ARF), CDKN2A (p16INK4a), CHEK2, CTNNA1, DICER1, EPCAM  (Deletion/duplication testing only), GREM1 (promoter region deletion/duplication testing only), KIT, MEN1, MLH1, MSH2, MSH3, MSH6, MUTYH, NBN, NF1, NHTL1, PALB2, PDGFRA, PMS2, POLD1, POLE, PTEN, RAD50, RAD51C, RAD51D, SDHB, SDHC, SDHD, SMAD4, SMARCA4. STK11, TP53, TSC1, TSC2, and VHL.  The following genes were evaluated for sequence changes only: SDHA and HOXB13 c.251G>A variant only. The report date is 03/29/2020   04/28/2020 - 10/27/2020 Antibody Plan   Added Keytruda q3weeks starting 04/28/20 to complete 1 year of treatment. Held since 10/27/20 due to skin rash and body aches.    08/17/2020 Breast MRI   IMPRESSION: 1. The biopsy proven malignancy in the left breast and the adjacent satellite lesion have resolved in the interval. No abnormalities in these locations today. 2. No MRI evidence of malignancy in the right breast. 3. No adenopathy identified today   09/20/2020 Surgery   LEFT MODIFIED RADICAL MASTECTOMY by Dr Donne Hazel   09/20/2020 Pathology Results   FINAL MICROSCOPIC DIAGNOSIS:   A. BREAST, LEFT, MODIFIED RADICAL MASTECTOMY:  - No residual invasive carcinoma in breast status post neoadjuvant  treatment.  - Metastatic carcinoma in (2) of (14) lymph nodes.  - Biopsy sites (one in breast, one in  one of the positive lymph nodes).  - See oncology table.   B. ADDITIONAL AXILLARY CONTENTS, LEFT, DISSECTION:  - Metastatic carcinoma in (5) of (5) lymph nodes.    09/20/2020 Cancer Staging   Staging form: Breast, AJCC 8th Edition - Pathologic stage from 09/20/2020: No Stage Recommended (ypT0, pN2a, cM0, G3, ER-, PR-, HER2-) - Signed by Gardenia Phlegm, NP on 10/04/2020 Stage prefix: Post-therapy Histologic grading system: 3 grade system    10/30/2020 - 12/11/2020 Radiation Therapy   Adjuvant Radiation with Dr Sondra Come starting 10/30/20   10/30/2020 -  Chemotherapy   Adjuvant Xeloda starting 10/30/20 at 1522m BID M-F while on RT. Held since 11/20/2020 due to recurrent skin rash    01/18/2021 -  Chemotherapy    Patient is on Treatment Plan: COLORECTAL 5FU / LEUCOVORIN MODIFIED DEGRAMONT Q14D X 12 CYCLES       03/28/2021 Imaging   CT CAP  IMPRESSION: 1. Status post left modified radical mastectomy and left axillary lymph node dissection, with no definitive findings to suggest metastatic disease in the chest, abdomen or pelvis on today's examination. Developing postradiation fibrosis in the apex of the left upper lobe is new compared to the prior study, but typical in the setting of prior axillary radiation therapy. 2. There appears to be 2 lesions in the uterus, one of which appears slightly enlarged compared to the prior study, while the largest lesion on the prior examination has partially regressed, as detailed above. 3. Hepatic steatosis.   06/15/2021 Imaging   EXAM: CT CHEST, ABDOMEN, AND PELVIS WITH CONTRAST  IMPRESSION: 1. Stable exam. No evidence for metastatic disease in the chest, abdomen, or pelvis. 2. 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 3. Uterine fibroids.   08/28/2021 Pathology Results   Diagnosis  Skin, left chest wall, punch  - INFILTRATING CARCINOMA, CONSISTENT WITH BREAST ORIGIN Microscopic Description Sections show an infiltrating tumor composed of cords and strands of single cells arranged in a back-to-back pattern with gland-like structures. The individual tumor cells are pleomorphic with enlarged nuclei, small nucleoli and an increased mitotic index. The surrounding stroma is collagenous. No connection is seen between the tumor and the overlying dermis.  NOTE: The tumor cells are strongly positive for Cytokeratin 7 and GATA-3 staining. The combined histologic and immunohistochemical findings, in addition to the clinical history, support a diagnosis of breast carcinoma involving the skin.      INTERVAL HISTORY:  Caitlyn PEIFERwas contacted for a follow up of breast cancer. She was last seen by me on  08/21/21. She reports her left chest wall is very inflamed and uncomfortable.    All other systems were reviewed with the patient and are negative.  MEDICAL HISTORY:  Past Medical History:  Diagnosis Date   Anemia    Anxiety    Arthritis    [place]    Left Breast   Depression    GERD (gastroesophageal reflux disease)    Headache    History of radiation therapy 10/30/20-12/11/20   left chest wall and axilla - Dr. JGery Pray  Panic attack     SURGICAL HISTORY: Past Surgical History:  Procedure Laterality Date   MASTECTOMY MODIFIED RADICAL Left 09/20/2020   Procedure: LEFT MODIFIED RADICAL MASTECTOMY;  Surgeon: WRolm Bookbinder MD;  [place]  Service: General;  Laterality: Left;  RNFA; [place]   PORTACATH PLACEMENT Right 03/21/2020   Procedure: INSERTION PORT-A-CATH WITH ULTRASOUND GUIDANCE;  Surgeon: WRolm Bookbinder MD;  Location:  [facility];  Service: General;  Laterality: Right;    I have reviewed the social history and family history with the patient and they are unchanged from previous note.  ALLERGIES:  is allergic to medroxyprogesterone and xeloda [capecitabine].  MEDICATIONS:  Current Outpatient Medications  Medication Sig Dispense Refill   ALPRAZolam (XANAX) 1 MG tablet Take 1 mg by mouth 3 (three) times daily as needed for anxiety.      baclofen (LIORESAL) 10 MG tablet Take 1 tablet (10 mg total) by mouth 2 (two) times daily as needed for muscle spasms. (Patient not taking: Reported on 06/13/2021) 30 each 0   citalopram (CELEXA) 20 MG tablet Take 20 mg by mouth daily.     diphenhydrAMINE (BENADRYL) 25 mg capsule Take 25 mg by mouth every 6 (six) hours as needed for allergies. (Patient not taking: Reported on 06/13/2021)     diphenhydrAMINE HCl (ZZZQUIL) 50 MG/30ML LIQD Take 30 mLs by mouth at bedtime as needed (sleep). (Patient not taking: Reported on 06/13/2021)     fluticasone (FLONASE) 50 MCG/ACT nasal spray Place 2 sprays into  both nostrils daily as needed for allergies or rhinitis. (Patient not taking: Reported on 06/13/2021)     hydroxychloroquine (PLAQUENIL) 200 MG tablet Take 200 mg by mouth 2 (two) times daily.     ibuprofen (ADVIL) 200 MG tablet Take 800 mg by mouth every 8 (eight) hours as needed for moderate pain.     lidocaine-prilocaine (EMLA) cream APPLY TO AFFECTED AREA ONCE AS NEEDED (PORT ACCESS) 30 g 3   montelukast (SINGULAIR) 10 MG tablet TAKE 1 TABLET BY MOUTH EVERYDAY AT BEDTIME (Patient not taking: Reported on 06/13/2021) 30 tablet 0   triamcinolone ointment (KENALOG) 0.5 % Apply 1 application topically 2 (two) times daily. (Patient not taking: Reported on 06/13/2021) 30 g 2   No current facility-administered medications for this visit.   Facility-Administered Medications Ordered in Other Visits  Medication Dose Route Frequency Provider Last Rate Last Admin   sodium chloride flush (NS) 0.9 % injection 10 mL  10 mL Intravenous PRN Alla Feeling, NP   10 mL at 03/28/20 1104    PHYSICAL EXAMINATION: ECOG PERFORMANCE STATUS: 1 - Symptomatic but completely ambulatory  There were no vitals filed for this visit. Wt Readings from Last 3 Encounters:  08/21/21 227 lb 8 oz (103.2 kg)  07/31/21 230 lb (104.3 kg)  06/18/21 229 lb 1.6 oz (103.9 kg)     No vitals taken today, Exam not performed today  LABORATORY DATA:  I have reviewed the data as listed CBC Latest Ref Rng & Units 08/21/2021 07/30/2021 06/15/2021  WBC 4.0 - 10.5 K/uL 6.5 6.4 4.3  Hemoglobin 12.0 - 15.0 g/dL 12.9 12.0 11.7(L)  Hematocrit 36.0 - 46.0 % 39.4 36.1 35.4(L)  Platelets 150 - 400 K/uL 366 341 263     CMP Latest Ref Rng & Units 08/21/2021 07/30/2021 06/15/2021  Glucose 70 - 99 mg/dL 87 141(H) 94  BUN 6 - 20 mg/dL 26(H) 14 13  Creatinine 0.44 - 1.00 mg/dL 0.81 0.76 0.82  Sodium 135 - 145 mmol/L 135 138 138  Potassium 3.5 - 5.1 mmol/L 4.2 4.0 3.9  Chloride 98 - 111 mmol/L 101 104 103  CO2 22 - 32 mmol/L _0 Calcium 8.9  - 10.3 mg/dL 9.5 9.2 9.0  Total Protein 6.5 - 8.1 g/dL 7.6 7.2 7.0  Total Bilirubin 0.3 - 1.2 mg/dL 0.3 0.5 0.8  Alkaline Phos 38 - 126 U/L  120 145(H) 186(H)  AST 15 - 41 U/L _0 ALT 0 - 44 U/L _1 RADIOGRAPHIC STUDIES: I have personally reviewed the radiological images as listed and agreed with the findings in the report. No results found.    No orders of the defined types were placed in this encounter.  All questions were answered. The patient knows to call the clinic with any problems, questions or concerns. No barriers to learning was detected. The total time spent in the appointment was 30 minutes.     Truitt Merle, MD 08/31/2021   I, Wilburn Mylar, am acting as scribe for Truitt Merle, MD.   I have reviewed the above documentation for accuracy and completeness, and I agree with the above.

## 2021-09-05 ENCOUNTER — Institutional Professional Consult (permissible substitution): Payer: Medicaid Other | Admitting: Obstetrics & Gynecology

## 2021-09-06 ENCOUNTER — Encounter: Payer: Self-pay | Admitting: Licensed Clinical Social Worker

## 2021-09-06 NOTE — Progress Notes (Signed)
[facility] CSW Progress Note ? ?Clinical Social Worker contacted patient by phone to follow up on psychiatry recommendation.  Pt informed writer she was able to go to her initial consultation with a psychiatrist on 2/28 who increased the dose of her anxiety medication and added an anti-depressant.  Pt also continues to see her therapist consistently.  Pt states she is struggling emotionally with recent pathology results, but feels adequately supported at this time.  At pt's request CSW to follow up w/ patient at the end of March to check in. ? ? ? ?Henriette Combs , LCSW ?

## 2021-09-06 NOTE — Progress Notes (Addendum)
Duplicate note entered in error

## 2021-09-10 ENCOUNTER — Other Ambulatory Visit: Payer: Self-pay | Admitting: Hematology

## 2021-09-10 ENCOUNTER — Telehealth: Payer: Self-pay | Admitting: Hematology

## 2021-09-10 NOTE — Telephone Encounter (Signed)
Scheduled per 3/6 in basket, pt has been called and confirmed  ?

## 2021-09-10 NOTE — Progress Notes (Signed)
DISCONTINUE OFF PATHWAY REGIMEN - Breast ? ? ?[id number]:Fluorouracil + Leucovorin, 8 week cycle (6 weeks on 2 weeks off): ?  Administer every 8 weeks: ?    Leucovorin  ?    Fluorouracil  ? ?**Always confirm dose/schedule in your pharmacy ordering system** ? ?REASON: Disease Progression ?PRIOR TREATMENT: Off Pathway: Fluorouracil + Leucovorin, 8 week cycle (6 weeks on 2 weeks off) ?TREATMENT RESPONSE: Unable to Evaluate ? ?START ON PATHWAY REGIMEN - Breast ? ? ?  A cycle is every 21 days: ?    Sacituzumab govitecan-hziy  ? ?**Always confirm dose/schedule in your pharmacy ordering system** ? ?Patient Characteristics: ?Distant Metastases or Locoregional Recurrent Disease - Unresected or Locally Advanced Unresectable Disease Progressing after Neoadjuvant and Local Therapies, HER2 Low/Negative/Unknown, ER Negative/Unknown, Chemotherapy, HER2 Negative/Unknown, Second Line ?Therapeutic Status: Locoregional Recurrent Disease - Unresected ?HER2 Status: Negative (-) ?ER Status: Negative (-) ?PR Status: Negative (-) ?Therapy Approach Indicated: Standard Chemotherapy/Endocrine Therapy ?Line of Therapy: Second Line ?Intent of Therapy: ?Non-Curative / Palliative Intent, Discussed with Patient ?

## 2021-09-13 ENCOUNTER — Inpatient Hospital Stay: Payer: Medicaid Other | Attending: Hematology

## 2021-09-13 ENCOUNTER — Emergency Department (HOSPITAL_COMMUNITY): Payer: Medicaid Other

## 2021-09-13 ENCOUNTER — Emergency Department (HOSPITAL_COMMUNITY)
Admission: EM | Admit: 2021-09-13 | Discharge: 2021-09-13 | Disposition: A | Discharge status: Home / Self Care | Payer: Medicaid Other | Attending: Emergency Medicine | Admitting: Emergency Medicine

## 2021-09-13 ENCOUNTER — Ambulatory Visit (HOSPITAL_COMMUNITY)
Admission: RE | Admit: 2021-09-13 | Discharge: 2021-09-13 | Disposition: A | Discharge status: Home / Self Care | Payer: Medicaid Other | Source: Ambulatory Visit | Attending: Hematology | Admitting: Hematology

## 2021-09-13 ENCOUNTER — Other Ambulatory Visit: Payer: Self-pay

## 2021-09-13 ENCOUNTER — Encounter (HOSPITAL_COMMUNITY): Payer: Self-pay | Admitting: Emergency Medicine

## 2021-09-13 DIAGNOSIS — Z853 Personal history of malignant neoplasm of breast: Secondary | ICD-10-CM | POA: Diagnosis not present

## 2021-09-13 DIAGNOSIS — R0789 Other chest pain: Secondary | ICD-10-CM | POA: Diagnosis present

## 2021-09-13 DIAGNOSIS — R000 Tachycardia, unspecified: Secondary | ICD-10-CM | POA: Insufficient documentation

## 2021-09-13 DIAGNOSIS — Z5189 Encounter for other specified aftercare: Secondary | ICD-10-CM | POA: Insufficient documentation

## 2021-09-13 DIAGNOSIS — C50112 Malignant neoplasm of central portion of left female breast: Secondary | ICD-10-CM | POA: Insufficient documentation

## 2021-09-13 DIAGNOSIS — Z95828 Presence of other vascular implants and grafts: Secondary | ICD-10-CM

## 2021-09-13 DIAGNOSIS — Z171 Estrogen receptor negative status [ER-]: Secondary | ICD-10-CM | POA: Insufficient documentation

## 2021-09-13 DIAGNOSIS — M25512 Pain in left shoulder: Secondary | ICD-10-CM | POA: Insufficient documentation

## 2021-09-13 DIAGNOSIS — J181 Lobar pneumonia, unspecified organism: Secondary | ICD-10-CM | POA: Diagnosis not present

## 2021-09-13 DIAGNOSIS — R079 Chest pain, unspecified: Secondary | ICD-10-CM

## 2021-09-13 DIAGNOSIS — Z5111 Encounter for antineoplastic chemotherapy: Secondary | ICD-10-CM | POA: Insufficient documentation

## 2021-09-13 DIAGNOSIS — Z5112 Encounter for antineoplastic immunotherapy: Secondary | ICD-10-CM | POA: Insufficient documentation

## 2021-09-13 LAB — COMPREHENSIVE METABOLIC PANEL
ALT: 21 U/L (ref 0–44)
AST: 17 U/L (ref 15–41)
Albumin: 3.8 g/dL (ref 3.5–5.0)
Alkaline Phosphatase: 148 U/L — ABNORMAL HIGH (ref 38–126)
Anion gap: 7 (ref 5–15)
BUN: 11 mg/dL (ref 6–20)
CO2: 27 mmol/L (ref 22–32)
Calcium: 9.1 mg/dL (ref 8.9–10.3)
Chloride: 106 mmol/L (ref 98–111)
Creatinine, Ser: 0.79 mg/dL (ref 0.44–1.00)
GFR, Estimated: >60 mL/min (ref >60)
Glucose, Bld: 101 mg/dL — ABNORMAL HIGH (ref 70–99)
Potassium: 3.6 mmol/L (ref 3.5–5.1)
Sodium: 140 mmol/L (ref 135–145)
Total Bilirubin: 0.5 mg/dL (ref 0.3–1.2)
Total Protein: 7 g/dL (ref 6.5–8.1)

## 2021-09-13 LAB — CBC WITH DIFFERENTIAL/PLATELET
Abs Immature Granulocytes: 0.02 10*3/uL (ref 0.00–0.07)
Basophils Absolute: 0.1 10*3/uL (ref 0.0–0.1)
Basophils Relative: 1 %
Eosinophils Absolute: 0.1 10*3/uL (ref 0.0–0.5)
Eosinophils Relative: 1 %
HCT: 37.2 % (ref 36.0–46.0)
Hemoglobin: 12.2 g/dL (ref 12.0–15.0)
Immature Granulocytes: 0 %
Lymphocytes Relative: 7 %
Lymphs Abs: 0.6 10*3/uL — ABNORMAL LOW (ref 0.7–4.0)
MCH: 29.5 pg (ref 26.0–34.0)
MCHC: 32.8 g/dL (ref 30.0–36.0)
MCV: 89.9 fL (ref 80.0–100.0)
Monocytes Absolute: 0.9 10*3/uL (ref 0.1–1.0)
Monocytes Relative: 11 %
Neutro Abs: 6.4 10*3/uL (ref 1.7–7.7)
Neutrophils Relative %: 80 %
Platelets: 340 10*3/uL (ref 150–400)
RBC: 4.14 MIL/uL (ref 3.87–5.11)
RDW: 15.8 % — ABNORMAL HIGH (ref 11.5–15.5)
WBC: 8.1 10*3/uL (ref 4.0–10.5)
nRBC: 0 % (ref 0.0–0.2)

## 2021-09-13 LAB — TROPONIN I (HIGH SENSITIVITY): Troponin I (High Sensitivity): 4 ng/L (ref <18)

## 2021-09-13 LAB — GLUCOSE, CAPILLARY: Glucose-Capillary: 100 mg/dL — ABNORMAL HIGH (ref 70–99)

## 2021-09-13 IMAGING — CT NM PET TUM IMG INITIAL (PI) SKULL BASE T - THIGH
1 of 7 series · 1 of 25 positions shown · non-contrast
Comparison: Multiple exams, including CT chest [DATE]

CLINICAL DATA: Subsequent treatment strategy for left breast
cancer.

EXAM:
NUCLEAR MEDICINE PET SKULL BASE TO THIGH
TECHNIQUE: 11.2 mCi F-18 FDG was injected intravenously. Full-ring PET imaging
was performed from the skull base to thigh after the radiotracer. CT
data was obtained and used for attenuation correction and anatomic
localization.
Fasting blood glucose: 100 mg/dl

[Series 4: ct sk_thigh 5.0 hd_fov · axial · 5.0mm · 1.21mm/px · 1 of 237 slices shown]
[im 1/237  brain]
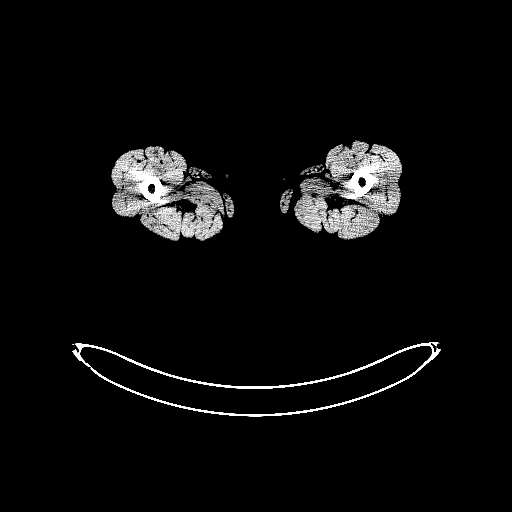

[1 of 25 positions shown; findings below may reference images not displayed]

FINDINGS: Mediastinal blood pool activity: SUV max

Liver activity: SUV max NA

NECK: Symmetric glottic and palatine tonsillar activity, likely
physiologic.

A left level IV lymph node measuring 0.9 cm in short axis on image
41 series 4 has maximum SUV of 16.5, compatible with malignancy.

Incidental CT findings: none

CHEST: Hypermetabolic left supraclavicular and right axillary
adenopathy observed, index left supraclavicular node measuring
cm in short axis on image 49 series 4 with maximum SUV 6.4. Index
right axillary lymph node 0.7 cm in short axis on image 72 series 4
with maximum SUV 7.1.

There is very high activity in the right upper SVC along the distal
Port-A-Cath, with associated calcification in this vicinity
suggesting chronic mural thrombus/chronic fibrin sheath. Maximum SUV
in this vicinity is 235.6, strongly favoring concentrated FDG from
injection along the fibrin sheath/chronic calcification. There is
also some mild activity in the Port-A-Cath and in the Port-A-Cath
tubing suggesting that the Port-A-Cath was indeed used for the IV
injection of radiopharmaceutical.

Left mastectomy noted. There is low-grade activity along the
cutaneous margin of the mastectomy site, representative maximum SUV
4.1, without substantial focal high activity. This likely represents
postoperative activity rather than residual local malignancy.

New bandlike and nodular airspace opacity in the apicoposterior
segment left upper lobe on image 54 series 4, maximum SUV 4.5. New
ill-defined subpleural nodularity in the left upper lobe with
moderate associated activity, including a 1.8 by 0.7 cm opacity on
image 65 series 4 with maximum SUV 3.3, and an ill-defined 2.9 by
1.5 cm opacity spanning along the left upper lobe and left lower
lobe with maximum SUV 4.0. These could reflect local radiation
therapy related findings, strictly speaking, low-grade malignancy in
these locations cannot be readily excluded.

Incidental CT findings: none

ABDOMEN/PELVIS: Mildly accentuated activity along the vaginal
vestibule/perineum, maximum SUV 8.3, technically nonspecific
although probably from slight incontinence of urinary FDG.

The soft tissue mass arising from the right uterine fundus has
rotated somewhat compared to prior exams, and measures 8.1 by 6.7 cm
on image 174 series 4, with mostly homogeneous internal uptake
although with some mild heterogeneity, and uptake mildly greater
than in the myometrium. Maximum SUV 6.0, representative myometrial
activity 4.4.

Incidental CT findings: none

SKELETON: No significant abnormal hypermetabolic activity in this
region.

Incidental CT findings: none
IMPRESSION: 1. Hypermetabolic left level IV lower neck adenopathy and left
supraclavicular and right axillary adenopathy compatible with active
malignancy.
2. New nodularity or nodular airspace opacities in the apical
segment left upper lobe, and in subpleural portions of the left
upper lobe and adjacent left lower lobe with low to moderate
activity. This could reflect findings related to interval radiation
therapy or possibly pneumonia. Malignancy is less likely given the
configuration but not entirely excluded, and surveillance is
suggested.
3. The soft tissue mass arising from the right uterine fundus has
changed its orientation compared to prior exams, and measures 8.1 cm
in long axis. This has mildly greater uptake than the myometrium
with maximum SUV of 6.0. Prior characterization has suggested that
this is likely a fibroid and the lesion does not appear to have
substantially enlarged since earliest available comparison of
[DATE]. Maximum SUV of 6.0 can be seen in uterine fibroids. The
lesion is somewhat unusual in its size and appearance for fibroid,
and surveillance is probably reasonable.
4. Accentuated activity along the vaginal vestibule/perineum,
without visible CT abnormality, probably from slight urinary
incontinence of urinary FDG.
5. High activity along the distal Port-A-Cath catheter, with
calcifications in the SVC in the vicinity of this catheter raising
suspicion for chronic mural thrombus. The very high activity around
the catheter likely relates to stasis of injected FDG along fibrin
sheath/chronic calcification.

## 2021-09-13 IMAGING — CR DG CHEST 2V
2 series · 2 of 2 positions shown · non-contrast
Comparison: None.

CLINICAL DATA: Shortness of breath

EXAM:
CHEST - 2 VIEW

[w chest pa]
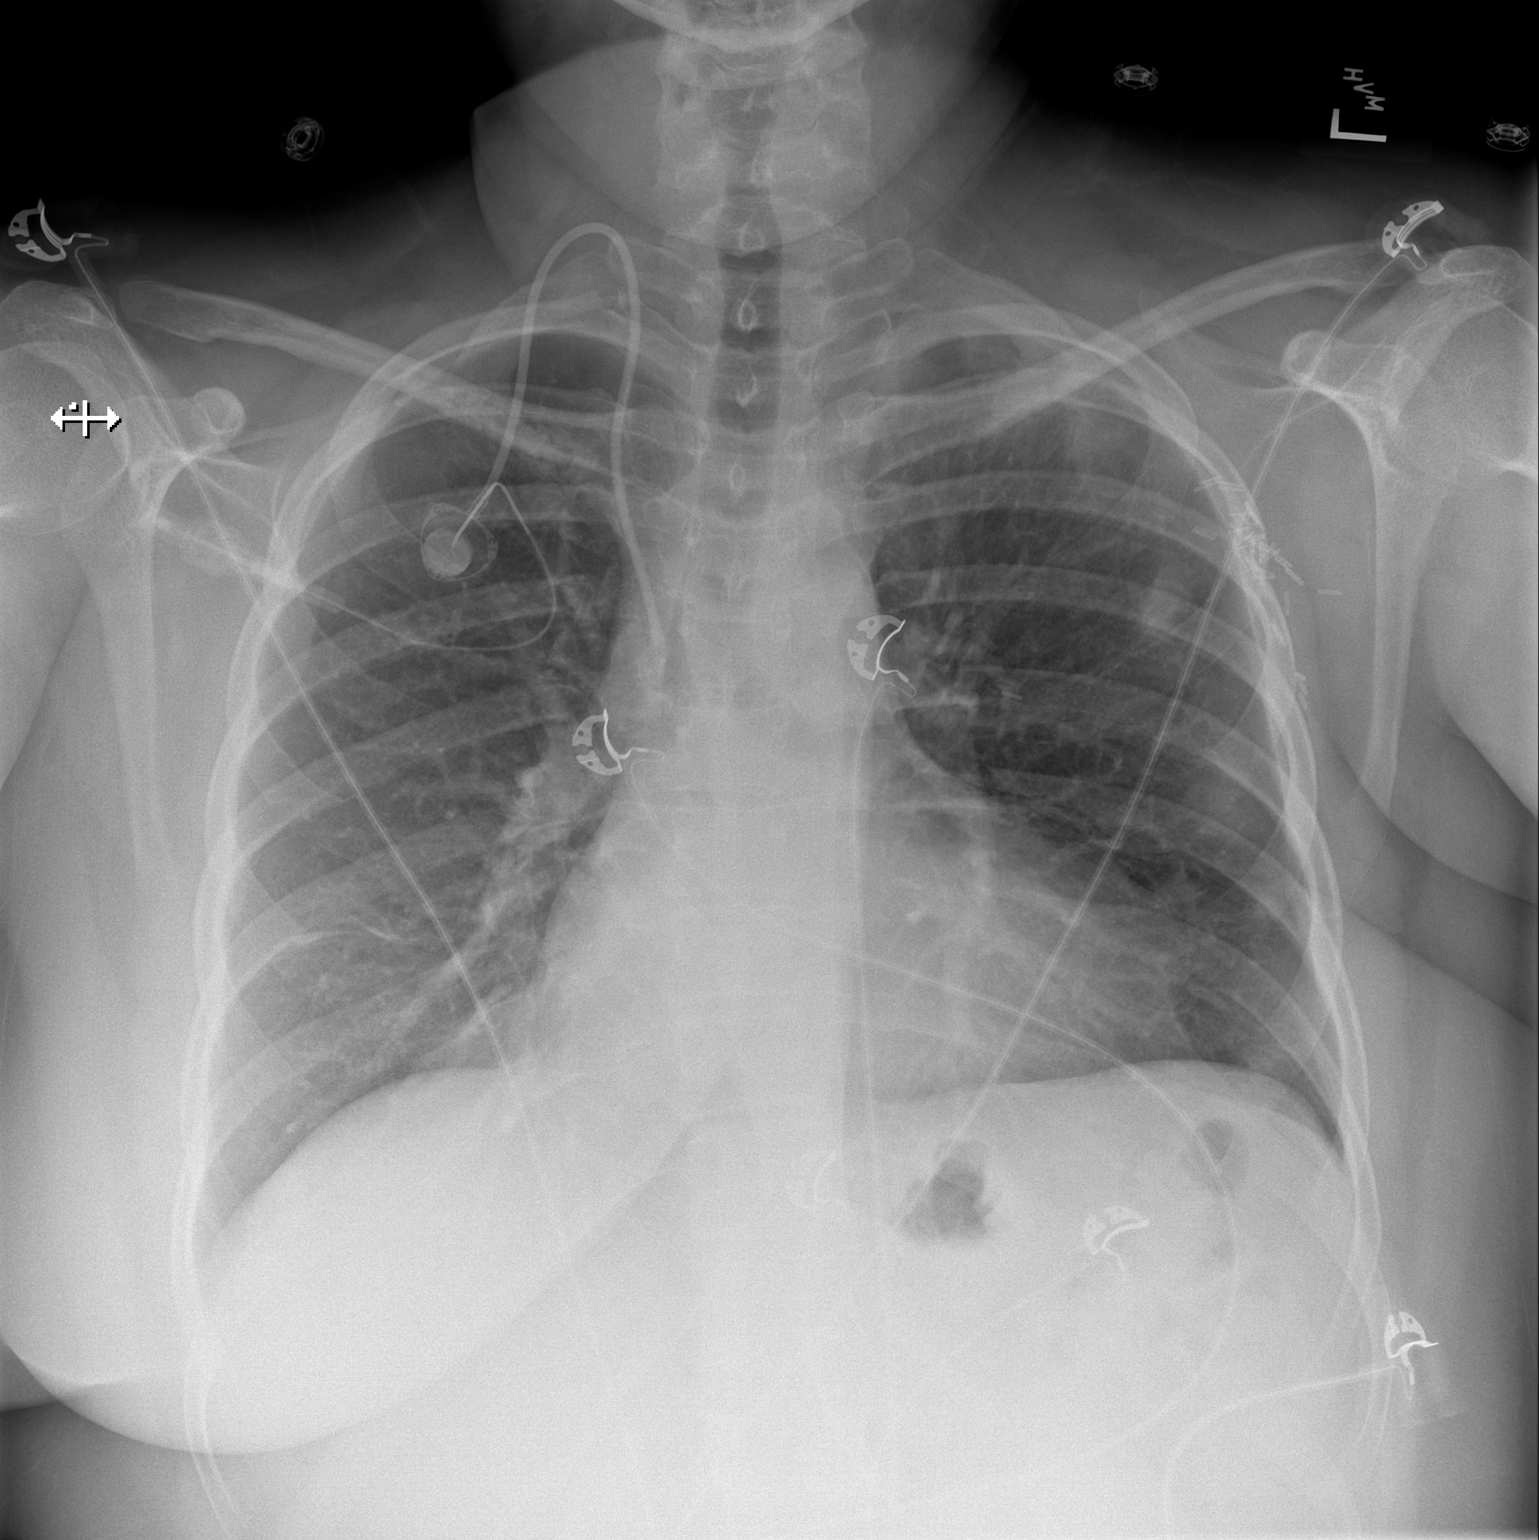

[w chest lat]
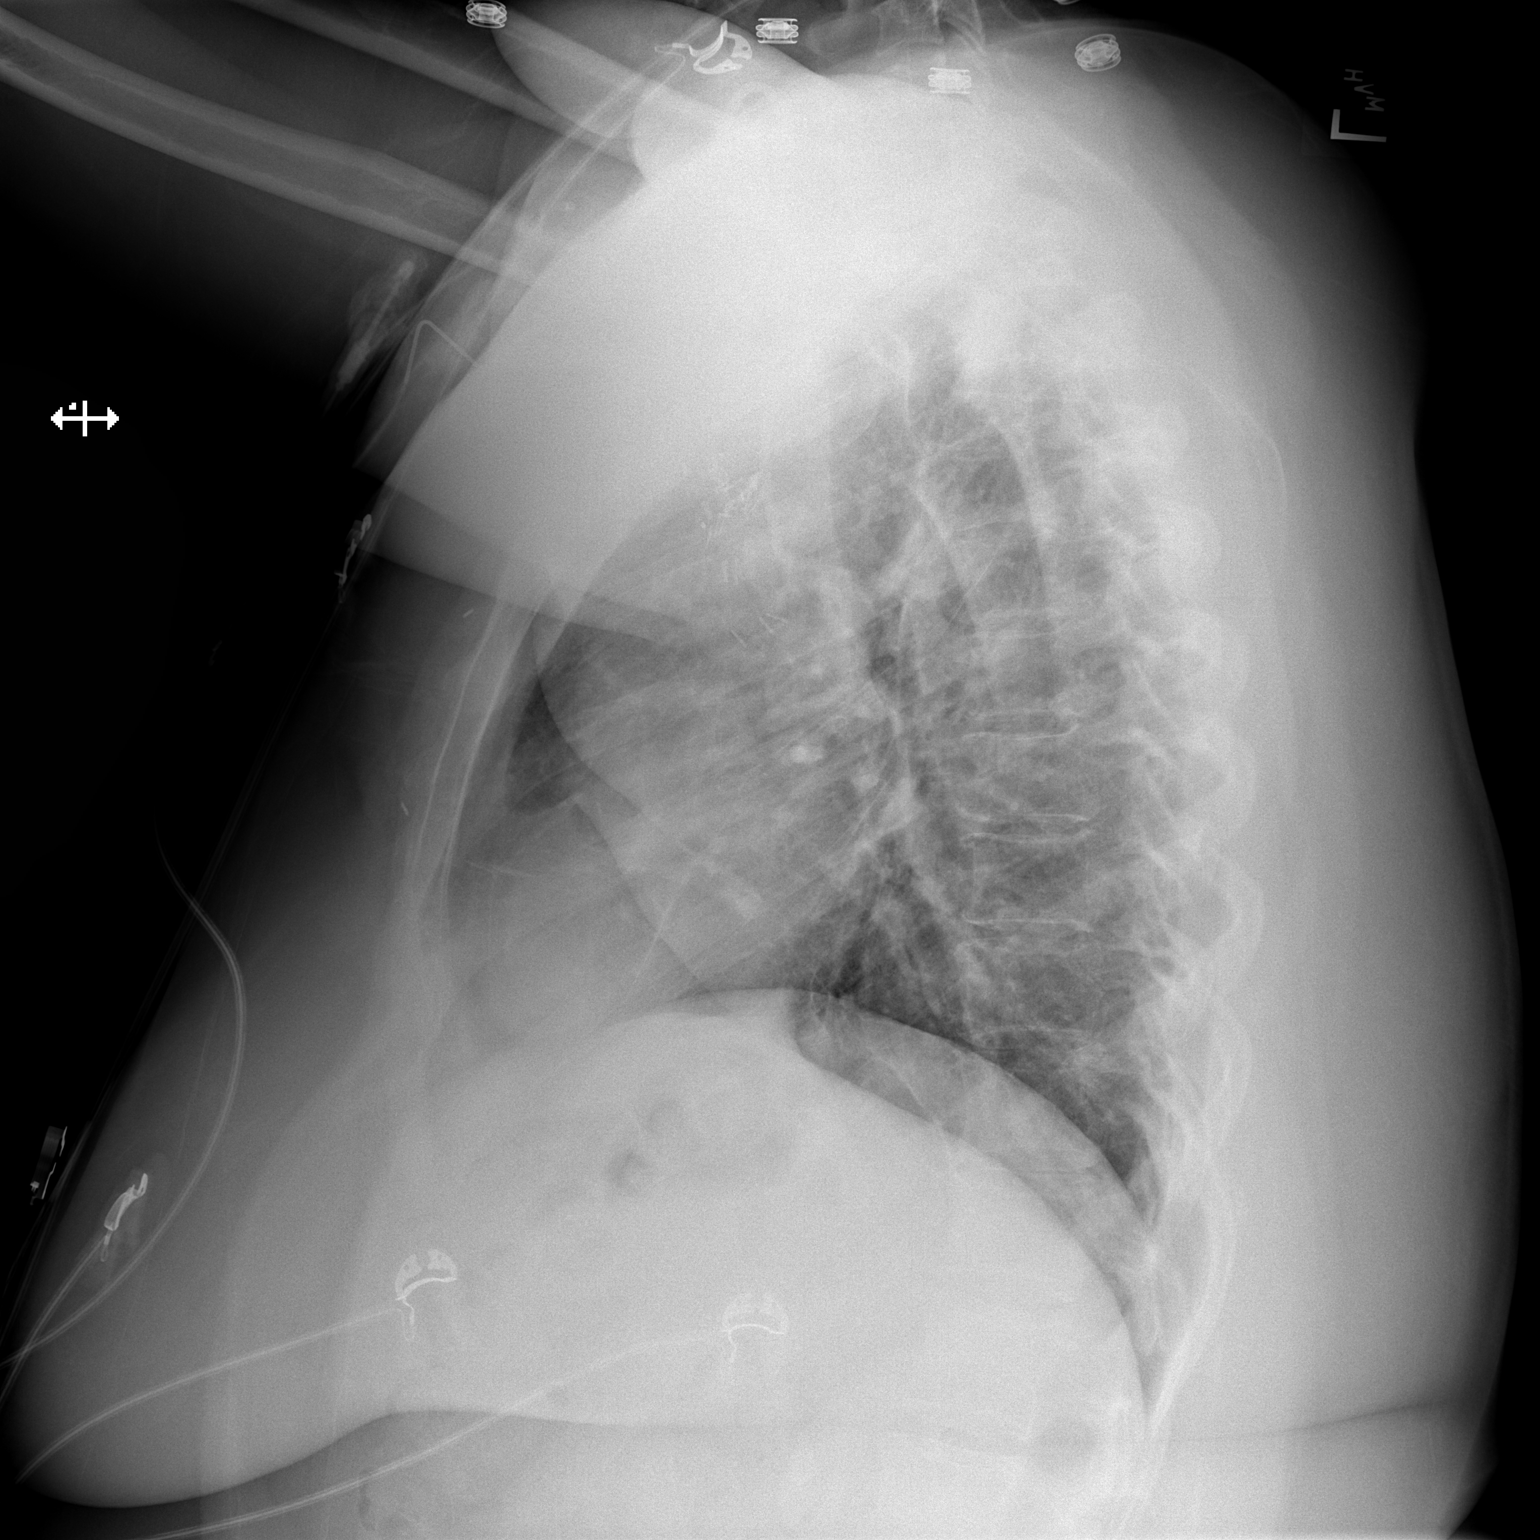

[2 of 2 positions shown; findings below may reference images not displayed]

FINDINGS: Patchy opacities in the upper left lung. No pleural effusion or
pneumothorax. Cardiomediastinal contours are within normal limits.
IMPRESSION: Patchy opacities in the upper left lung may be
infectious/inflammatory or may reflect metastatic disease.

## 2021-09-13 IMAGING — CT CT ANGIO CHEST
2 of 6 series · 18 of 36 positions shown · IV contrast (agent unspecified)
Comparison: No prior CTA, correlation is made with CT chest abdomen
pelvis [DATE]

CLINICAL DATA: Pleuritic left-sided chest pain, concern for
pulmonary embolism

EXAM:
CT ANGIOGRAPHY CHEST WITH CONTRAST
TECHNIQUE: Multidetector CT imaging of the chest was performed using the
standard protocol during bolus administration of intravenous
contrast. Multiplanar CT image reconstructions and MIPs were
obtained to evaluate the vascular anatomy.

[Series 5: thins · axial · 0.71mm/px · z∈[-66,+170]mm · 17 of 267 slices shown]
[im 15/267  lung]
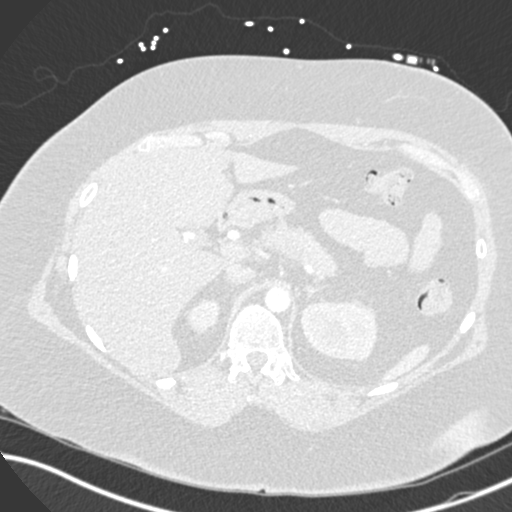
[im 30/267  mediastinal]
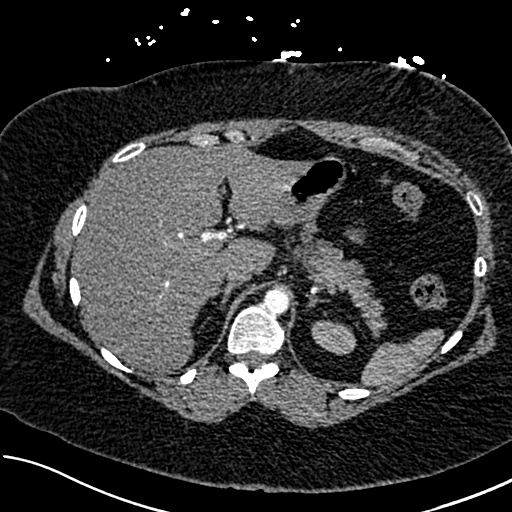
[im 45/267  lung]
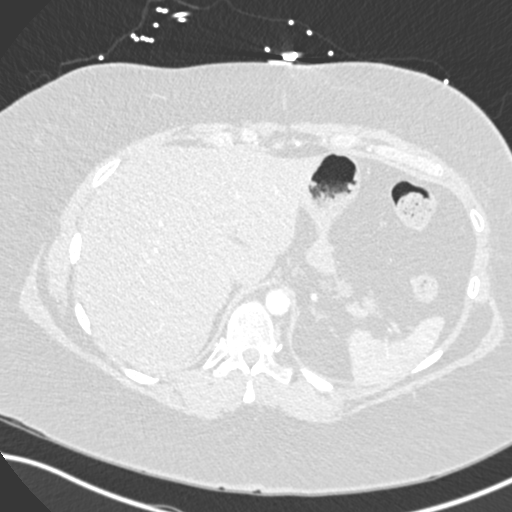
[im 60/267  mediastinal]
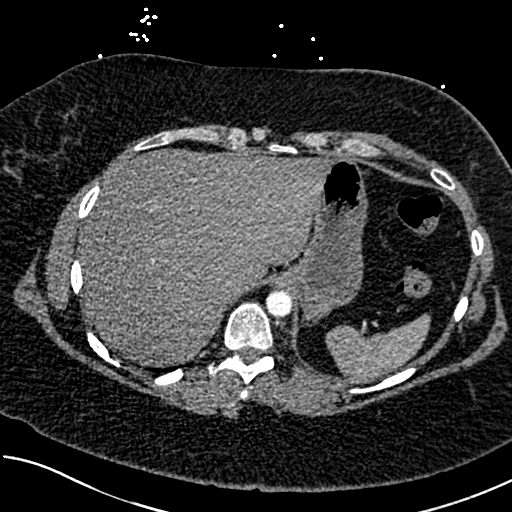
[im 74/267  lung]
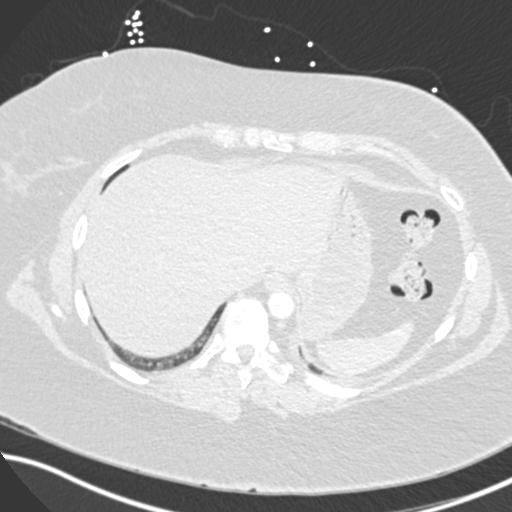
[im 89/267  mediastinal]
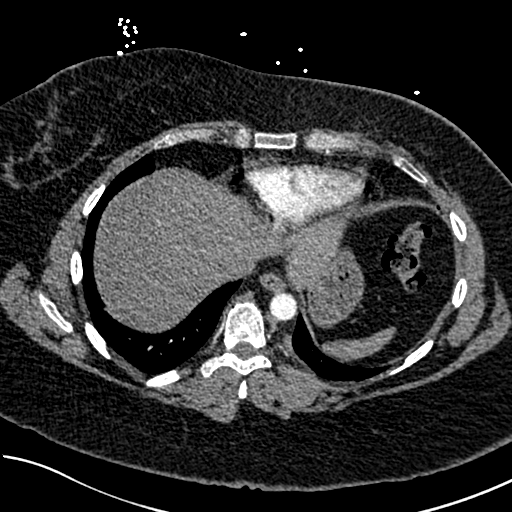
[im 104/267  lung]
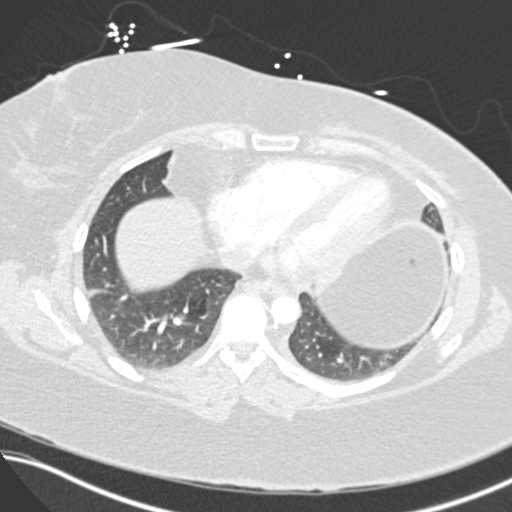
[im 119/267  mediastinal]
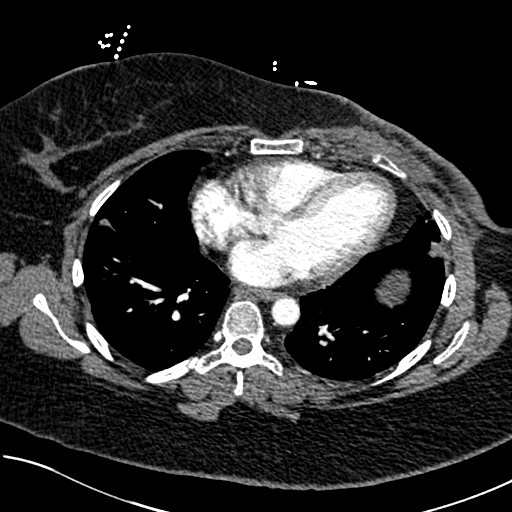
[im 134/267  lung]
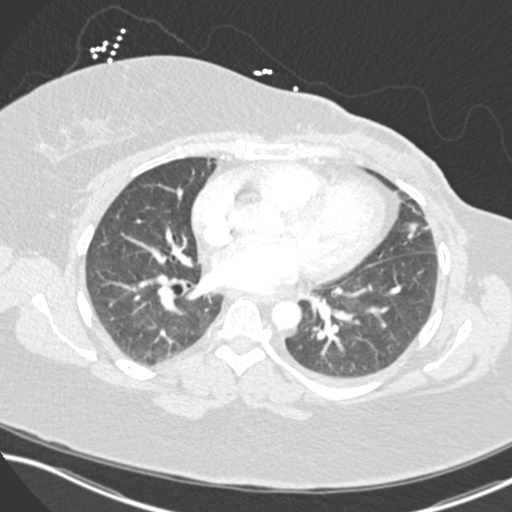
[im 148/267  mediastinal]
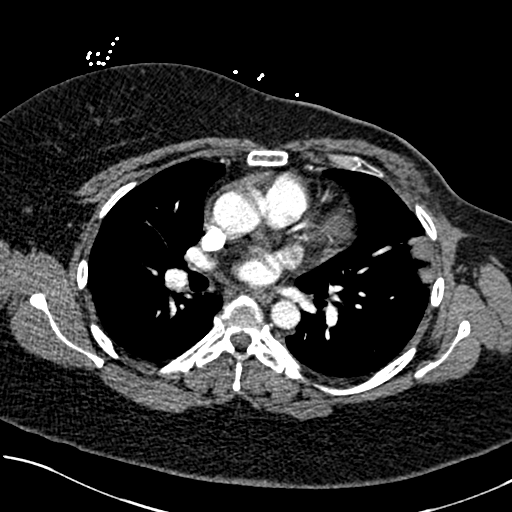
[im 163/267  lung]
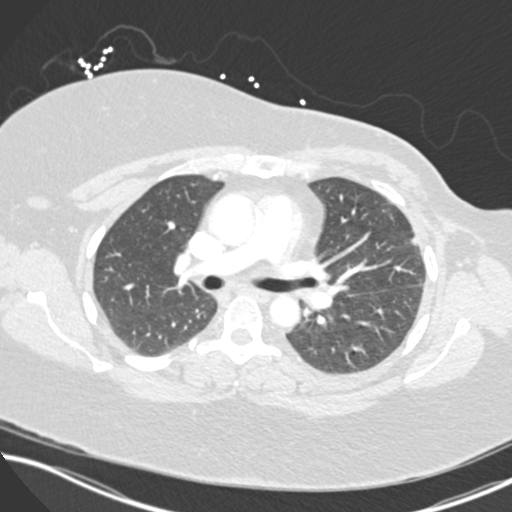
[im 178/267  mediastinal]
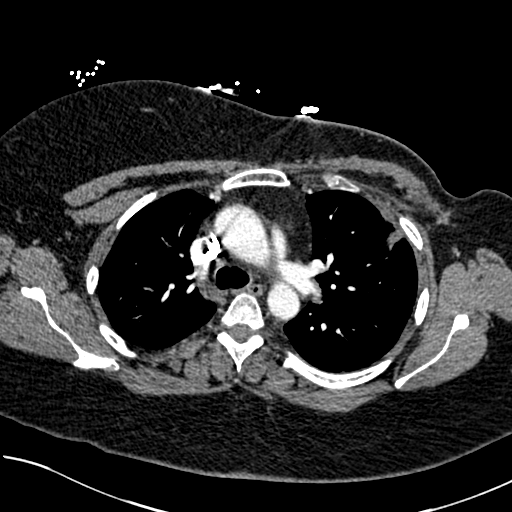
[im 193/267  lung]
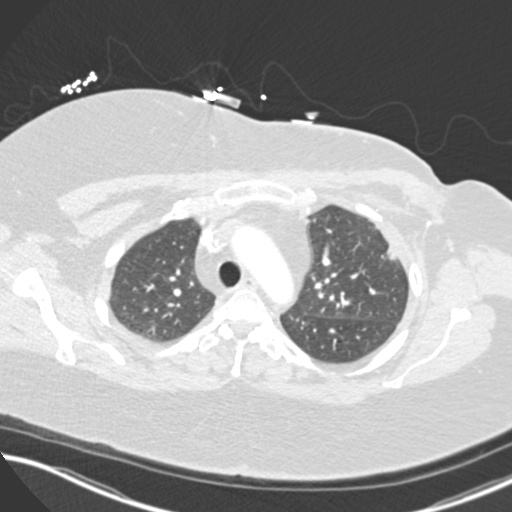
[im 207/267  mediastinal]
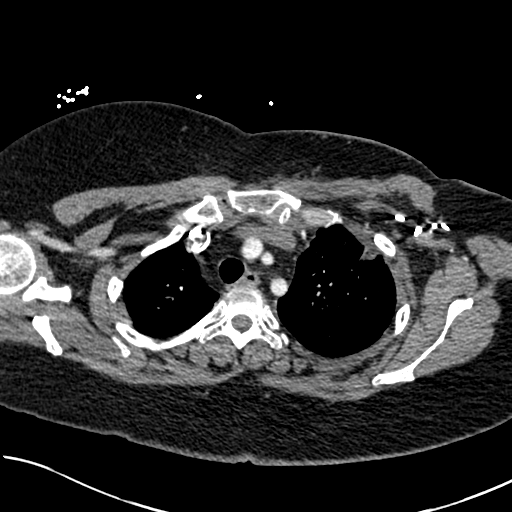
[im 222/267  lung]
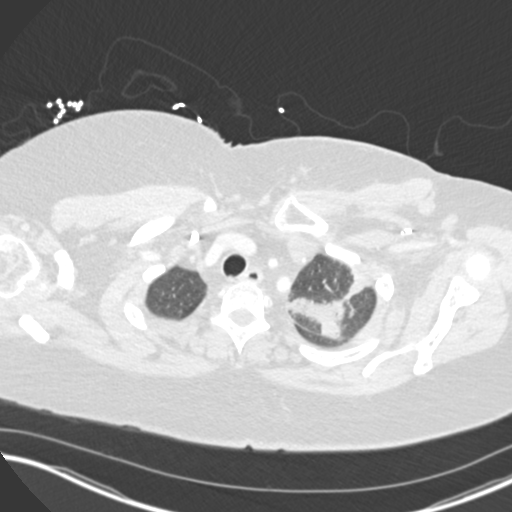
[im 237/267  mediastinal]
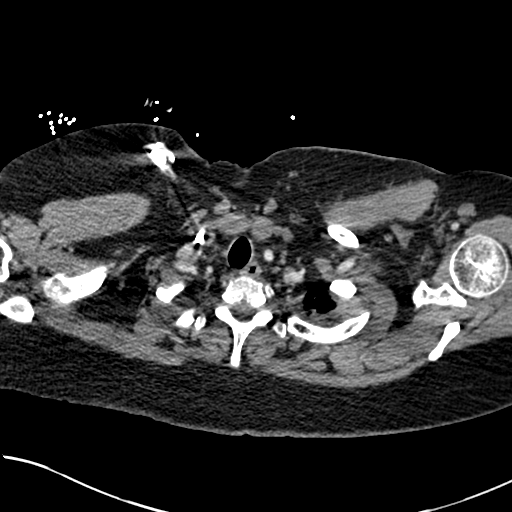
[im 252/267  lung]
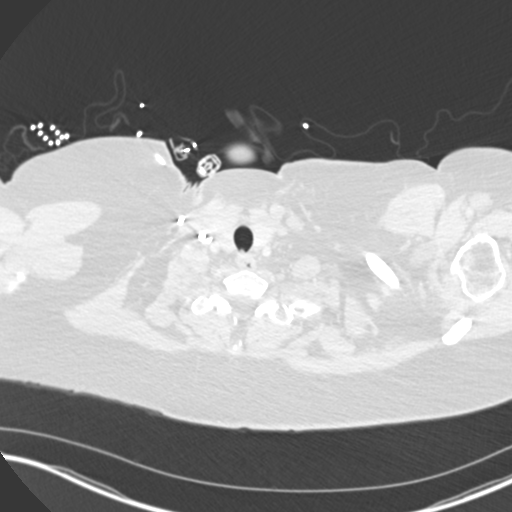

[Series 7: coronal mpr · coronal · 0.55mm/px · 1 of 151 slices shown]
[im 76/151  mediastinal]
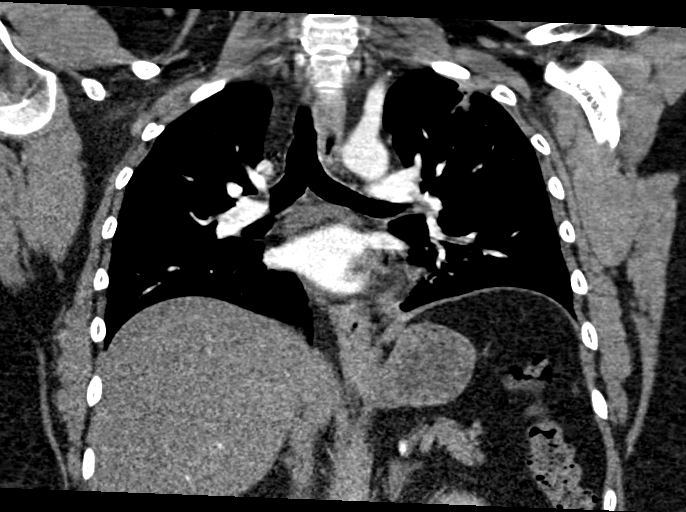

[18 of 36 positions shown; findings below may reference images not displayed]

RADIATION DOSE REDUCTION: This exam was performed according to the
departmental dose-optimization program which includes automated
exposure control, adjustment of the mA and/or kV according to
patient size and/or use of iterative reconstruction technique.

CONTRAST:  80mL OMNIPAQUE IOHEXOL 350 MG/ML SOLN
FINDINGS: Cardiovascular: Satisfactory opacification of the pulmonary arteries
to the segmental level. No evidence of pulmonary embolism. Normal
heart size. No pericardial effusion. Right chest port with catheter
tip approximating the mid to distal right SVC.

Mediastinum/Nodes: Status post left axillary lymph node dissection.
No enlarged mediastinal, hilar, or axillary lymph nodes. No acute
abnormality in the thyroid, trachea, or esophagus.

Lungs/Pleura: Consolidative opacities with some surrounding
ground-glass in the left apex, anterior and lateral more inferior
left upper lobe, and lateral left lower lobe (series 5, image 46,
87, 123, and 140). Linear opacities in the inferior right lower
lobe, most likely atelectasis. No pleural effusion or pneumothorax.

Upper Abdomen: No acute abnormality.

Musculoskeletal: Status post left mastectomy with unchanged
appearance of the chest wall and soft tissues.

Review of the MIP images confirms the above findings.
IMPRESSION: 1. Negative for pulmonary embolism.
2. Consolidative opacities with surrounding ground-glass in multiple
locations in the left lung, concerning for multifocal infection,
including possibly atypical or fungal infection. Recommend follow-up
CT in 3 months to ensure resolution.

## 2021-09-13 MED ORDER — LIDOCAINE VISCOUS HCL 2 % MT SOLN
15.0000 mL | Freq: Once | OROMUCOSAL | Status: AC
  Administered 2021-09-13: 13:00:00 15 mL via ORAL
  Filled 2021-09-13: qty 15

## 2021-09-13 MED ORDER — IOHEXOL 350 MG/ML SOLN
80.0000 mL | Freq: Once | INTRAVENOUS | Status: AC | PRN
  Administered 2021-09-13: 12:00:00 80 mL via INTRAVENOUS

## 2021-09-13 MED ORDER — SODIUM CHLORIDE 0.9% FLUSH
10.0000 mL | Freq: Once | INTRAVENOUS | Status: AC
  Administered 2021-09-13: 09:00:00 10 mL

## 2021-09-13 MED ORDER — FLUDEOXYGLUCOSE F - 18 (FDG) INJECTION
11.4000 | Freq: Once | INTRAVENOUS | Status: AC
  Administered 2021-09-13: 10:00:00 11.4 via INTRAVENOUS

## 2021-09-13 MED ORDER — AMOXICILLIN-POT CLAVULANATE 875-125 MG PO TABS
1.0000 | ORAL_TABLET | Freq: Two times a day (BID) | ORAL | 0 refills | Status: DC
Start: 2021-09-13 — End: 2021-09-28

## 2021-09-13 MED ORDER — ALPRAZOLAM 0.5 MG PO TABS
2.0000 mg | ORAL_TABLET | Freq: Once | ORAL | Status: AC
  Administered 2021-09-13: 13:00:00 2 mg via ORAL
  Filled 2021-09-13: qty 4

## 2021-09-13 MED ORDER — HEPARIN SOD (PORK) LOCK FLUSH 100 UNIT/ML IV SOLN
500.0000 [IU] | Freq: Once | INTRAVENOUS | Status: AC
  Administered 2021-09-13: 15:00:00 500 [IU]
  Filled 2021-09-13: qty 5

## 2021-09-13 MED ORDER — KETOROLAC TROMETHAMINE 15 MG/ML IJ SOLN
15.0000 mg | Freq: Once | INTRAMUSCULAR | Status: AC
  Administered 2021-09-13: 13:00:00 15 mg via INTRAVENOUS
  Filled 2021-09-13: qty 1

## 2021-09-13 MED ORDER — ALUM & MAG HYDROXIDE-SIMETH 200-200-20 MG/5ML PO SUSP
30.0000 mL | Freq: Once | ORAL | Status: AC
  Administered 2021-09-13: 13:00:00 30 mL via ORAL
  Filled 2021-09-13: qty 30

## 2021-09-13 NOTE — ED Provider Notes (Signed)
[facility] DEPT Provider Note   CSN: [id number] Arrival date & time: 09/13/21  1052     History  Chief Complaint  Patient presents with   Shoulder Pain    Caitlyn Hendricks is a 41 y.o. female.  41 year old female with history of breast carcinoma, anxiety, GERD presents today for evaluation of left-sided chest pain, shoulder blade pain of multiple week duration that significantly worsened yesterday.  She states she had a difficult time sleeping last night.  She has been using a massager without relief.  She had a PET scan this morning.  She was recently found to have cancerous lesion on biopsy.  She has shortness of breath with deep inspiration otherwise without dyspnea.  Denies lightheadedness, palpitations, diaphoresis.  Without history of DVT, or PE.  Not on anticoagulation.  Denies recent flight or road travel.  Denies lower extremity swelling or pain.  The history is provided by the patient. No language interpreter was used.      Home Medications Prior to Admission medications   Medication Sig Start Date End Date Taking? Authorizing Provider  ALPRAZolam Duanne Moron) 1 MG tablet Take 1 mg by mouth 3 (three) times daily as needed for anxiety.     [provider]  baclofen (LIORESAL) 10 MG tablet Take 1 tablet (10 mg total) by mouth 2 (two) times daily as needed for muscle spasms. Patient not taking: Reported on 06/13/2021 07/26/20   Truitt Merle, MD  citalopram (CELEXA) 20 MG tablet Take 20 mg by mouth daily.    [provider]  diphenhydrAMINE (BENADRYL) 25 mg capsule Take 25 mg by mouth every 6 (six) hours as needed for allergies. Patient not taking: Reported on 06/13/2021    [provider]  diphenhydrAMINE HCl (ZZZQUIL) 50 MG/30ML LIQD Take 30 mLs by mouth at bedtime as needed (sleep). Patient not taking: Reported on 06/13/2021    [provider]  fluticasone (FLONASE) 50 MCG/ACT nasal spray Place 2 sprays into both nostrils  daily as needed for allergies or rhinitis. Patient not taking: Reported on 06/13/2021    [provider]  hydroxychloroquine (PLAQUENIL) 200 MG tablet Take 200 mg by mouth 2 (two) times daily. 05/13/21   [provider]  ibuprofen (ADVIL) 200 MG tablet Take 800 mg by mouth every 8 (eight) hours as needed for moderate pain.    [provider]  lidocaine-prilocaine (EMLA) cream APPLY TO AFFECTED AREA ONCE AS NEEDED (PORT ACCESS) 08/22/21   Truitt Merle, MD  montelukast (SINGULAIR) 10 MG tablet TAKE 1 TABLET BY MOUTH EVERYDAY AT BEDTIME Patient not taking: Reported on 06/13/2021 04/10/21   Truitt Merle, MD  triamcinolone ointment (KENALOG) 0.5 % Apply 1 application topically 2 (two) times daily. Patient not taking: Reported on 06/13/2021 05/03/20   Truitt Merle, MD  prochlorperazine (COMPAZINE) 10 MG tablet Take 1 tablet (10 mg total) by mouth every 6 (six) hours as needed (Nausea or vomiting). 12/28/20 01/13/21  Truitt Merle, MD      Allergies    Medroxyprogesterone and Xeloda [capecitabine]    Review of Systems   Review of Systems  Constitutional:  Negative for activity change, chills and fever.  Respiratory:  Positive for shortness of breath.   Cardiovascular:  Positive for chest pain. Negative for palpitations and leg swelling.  Gastrointestinal:  Negative for abdominal pain, nausea and vomiting.  All other systems reviewed and are negative.  Physical Exam Updated Vital Signs BP (!) 153/109 (BP Location: Right Arm)    Pulse  90    Temp 98.4 F (36.9 C) (Oral)    Resp 18    Ht _0  (1.626 m)    Wt 103 kg    SpO2 100%    BMI 38.98 kg/m  Physical Exam Vitals and nursing note reviewed.  Constitutional:      General: She is not in acute distress.    Appearance: Normal appearance. She is not ill-appearing.  HENT:     Head: Normocephalic and atraumatic.     Nose: Nose normal.  Eyes:     General: No scleral icterus.    Extraocular Movements: Extraocular movements intact.      Conjunctiva/sclera: Conjunctivae normal.  Cardiovascular:     Rate and Rhythm: Regular rhythm. Tachycardia present.     Pulses: Normal pulses.     Heart sounds: Normal heart sounds.  Pulmonary:     Effort: Pulmonary effort is normal. No respiratory distress.     Breath sounds: Normal breath sounds. No wheezing or rales.  Abdominal:     General: There is no distension.     Tenderness: There is no abdominal tenderness.  Musculoskeletal:        General: No tenderness. Normal range of motion.     Cervical back: Normal range of motion.     Right lower leg: No edema.     Left lower leg: No edema.  Skin:    General: Skin is warm and dry.  Neurological:     General: No focal deficit present.     Mental Status: She is alert. Mental status is at baseline.    ED Results / Procedures / Treatments   Labs (all labs ordered are listed, but only abnormal results are displayed) Labs Reviewed  TROPONIN I (HIGH SENSITIVITY)    EKG None  Radiology No results found.  Procedures Procedures    Medications Ordered in ED Medications - No data to display  ED Course/ Medical Decision Making/ A&P                           Medical Decision Making Amount and/or Complexity of Data Reviewed Radiology: ordered.  Risk OTC drugs. Prescription drug management.   Medical Decision Making / ED Course   This patient presents to the ED for concern of left-sided chest pain/shoulder pain, this involves an extensive number of treatment options, and is a complaint that carries with it a high risk of complications and morbidity.  The differential diagnosis includes ACS, PE, pneumonia, GERD, MSK pain  MDM: 42 year old female presents today for evaluation of left shoulder pain/chest pain.  She does have history of breast carcinoma.  Patient reports she is having pain by her left scapula as well as left mastectomy site.  This has been going on for weeks however has been significantly worse since  yesterday.  She denies fever, cough, other URI symptoms.  She does not have history of DVT, PE, or other clotting disorders.  Will evaluate with ACS work-up as well as PE study.  CBC and CMP were done this morning at the infusion center without leukocytosis, or anemia.  CMP with glucose of 101 and alk phos of 148 otherwise unremarkable.  LFTs normal.  Troponin negative x1.  Given duration of chest pain began multiple weeks doubt that this is ACS.  EKG without acute ischemic changes.  CTA chest PE study negative for PE.  Chest x-ray and CTA do make note of multiple consolidative opacities in left  lung.  This could be infectious versus metastatic.  We will start treatment with Augmentin to cover infectious etiology.  Recommend she follows up with her oncology for further work-up and management.  Patient's vital signs are stable.  She is stable for discharge.  Return precautions discussed.  Patient voices understanding and is in agreement with plan.  Patient does have improvement in her symptoms following her home dose of Xanax which she states is 2 mg, GI cocktail, and Toradol.  ED course and plan discussed with patient's mom over the phone as well.   Additional history obtained: -Additional history obtained from oncology office which was comes, as patient has history of breast carcinoma and recent biopsy finding concerning for malignancy as well.  She underwent PET scan this morning.  Has not resulted yet. -External records from outside source obtained and reviewed including: Chart review including previous notes, labs, imaging, consultation notes   Lab Tests: -I ordered, reviewed, and interpreted labs.   The pertinent results include:   Labs Reviewed  TROPONIN I (HIGH SENSITIVITY)  TROPONIN I (HIGH SENSITIVITY)      EKG  EKG Interpretation  Date/Time:  Thursday September 13 2021 11:03:10 EST Ventricular Rate:  92 PR Interval:    QRS Duration: 93 QT Interval:  369 QTC Calculation: 457 R  Axis:   20 Text Interpretation: Normal sinus rhythm Low voltage, precordial leads ST elev, probable normal early repol pattern No old tracing to compare Confirmed by Caitlyn Hendricks [phone]) on 09/13/2021 12:13:51 PM         Imaging Studies ordered: I ordered imaging studies including chest x-ray, CTA chest PE study I independently visualized and interpreted imaging. I agree with the radiologist interpretation   Medicines ordered and prescription drug management: Meds ordered this encounter  Medications   iohexol (OMNIPAQUE) 350 MG/ML injection 80 mL   ketorolac (TORADOL) 15 MG/ML injection 15 mg   ALPRAZolam (XANAX) tablet 2 mg   AND Linked Order Group    alum & mag hydroxide-simeth (MAALOX/MYLANTA) 200-200-20 MG/5ML suspension 30 mL    lidocaine (XYLOCAINE) 2 % viscous mouth solution 15 mL   amoxicillin-clavulanate (AUGMENTIN) 875-125 MG tablet    Sig: Take 1 tablet by mouth every 12 (twelve) hours.    Dispense:  14 tablet    Refill:  0    Order Specific Question:   Supervising Provider    Answer:   MILLER, [place]    -I have reviewed the patients home medicines and have made adjustments as needed  Cardiac Monitoring: The patient was maintained on a cardiac monitor.  I personally viewed and interpreted the cardiac monitored which showed an underlying rhythm of: Sinus tachycardia  Reevaluation: After the interventions noted above, I reevaluated the patient and found that they have :improved  Co morbidities that complicate the patient evaluation  Past Medical History:  Diagnosis Date   Anemia    Anxiety    Arthritis    Cancer ([place])    Left Breast   Depression    GERD (gastroesophageal reflux disease)    Headache    History of radiation therapy 10/30/20-12/11/20   left chest wall and axilla - Dr. Gery Pray   Panic attack       Dispostion: Is appropriate for discharge.  Discharged in stable condition.  Return precautions discussed.   Final Clinical  Impression(s) / ED Diagnoses Final diagnoses:  Chest pain, unspecified type  Lung consolidation ([place])    Rx / DC Orders ED Discharge Orders  Ordered    amoxicillin-clavulanate (AUGMENTIN) 875-125 MG tablet  Every 12 hours        09/13/21 1410              Evlyn Courier, PA-C 09/13/21 1430    Caitlyn Pence, MD 09/13/21 [phone]

## 2021-09-13 NOTE — ED Triage Notes (Signed)
Reports L scapula pain with deep inspiration, has been going on for a few weeks but got drastically worse this AM. Presents from the cancer center, she had a PET scan this AM.  ?

## 2021-09-13 NOTE — ED Provider Triage Note (Addendum)
Emergency Medicine Provider Triage Evaluation Note ? ?Caitlyn Hendricks , a 41 y.o. female  was evaluated in triage.  Pt complains of pleuritic left-sided chest pain of multiple week duration that significantly worsened yesterday.  Patient had a hard time sleeping.  Tachycardic on exam.  Reports pain is located near her left shoulder blade but she also has having pain over her mastectomy site.  Shortness of breath on deep inspiration but otherwise denies dyspnea.  Without fever, chills, productive cough.  Denies history of DVT or PE.  Reports she currently has active cancer but is not on treatment at this time.  She underwent PET scan this morning. ? ?Review of Systems  ?Positive: As above ?Negative: As above ? ?Physical Exam  ?BP (!) 153/109 (BP Location: Right Arm)   Pulse 90   Temp 98.4 ?F (36.9 ?C) (Oral)   Resp 18   Ht '5\' 4"'$  (1.626 m)   Wt 103 kg   SpO2 100%   BMI 38.98 kg/m?  ?Gen:   Awake, no distress   ?Resp:  Normal effort  ?MSK:   Moves extremities without difficulty  ?Other:  Tachycardic. ? ?Medical Decision Making  ?Medically screening exam initiated at 11:15 AM.  Appropriate orders placed.  Caitlyn Hendricks was informed that the remainder of the evaluation will be completed by another provider, this initial triage assessment does not replace that evaluation, and the importance of remaining in the ED until their evaluation is complete. ? ?CBC and CMP done this morning at the infusion center. ?  Evlyn Courier, PA-C ?09/13/21 1116 ? ?  ?Evlyn Courier, PA-C ?09/13/21 1124 ? ?

## 2021-09-13 NOTE — ED Notes (Signed)
Pt ambulatory to waiting room. Pt verbalized understanding of discharge instructions.   

## 2021-09-13 NOTE — Discharge Instructions (Addendum)
Your blood work today was reassuring.  Your chest x-ray and CT scan did show some consolidation which could either be pneumonia or cancer related.  We will get you started on antibiotics to treat potential infection.  I want you to follow-up with oncology to be reevaluated.  If you have any worsening symptoms please return to the emergency room.  Your heart enzyme, and EKG were without concern for heart attack. ?

## 2021-09-14 ENCOUNTER — Inpatient Hospital Stay (HOSPITAL_BASED_OUTPATIENT_CLINIC_OR_DEPARTMENT_OTHER): Payer: Medicaid Other | Admitting: Hematology

## 2021-09-14 DIAGNOSIS — C50112 Malignant neoplasm of central portion of left female breast: Secondary | ICD-10-CM | POA: Diagnosis not present

## 2021-09-14 DIAGNOSIS — Z171 Estrogen receptor negative status [ER-]: Secondary | ICD-10-CM

## 2021-09-14 MED FILL — Fosaprepitant Dimeglumine For IV Infusion 150 MG (Base Eq): INTRAVENOUS | Qty: 5 | Status: AC

## 2021-09-14 MED FILL — Dexamethasone Sodium Phosphate Inj 100 MG/10ML: INTRAMUSCULAR | Qty: 1 | Status: AC

## 2021-09-14 NOTE — Progress Notes (Signed)
Caitlyn Hendricks   [phone] [phone]) [phone]   Clinic Follow up Note   Patient Care Team: [name], MD as PCP - General (Family Medicine) Morrison Old, NP as PCP - Dermatology (Nurse Practitioner) Mauro Kaufmann, RN as Oncology Nurse Navigator Rockwell Germany, RN as Oncology Nurse Navigator [name], MD as Consulting Physician (General Surgery) Truitt Merle, MD as Consulting Physician (Hematology) Gery Pray, MD as Consulting Physician (Radiation Oncology)  Date of Service:  09/14/2021  I connected with Caitlyn Hendricks on 09/14/2021 at 10:20 AM EST by telephone visit and verified that I am speaking with the correct person using two identifiers.  I discussed the limitations, risks, security and privacy concerns of performing an evaluation and management service by telephone and the availability of in person appointments. I also discussed with the patient that there may be a patient responsible charge related to this service. The patient expressed understanding and agreed to proceed.   Other persons participating in the visit and their role in the encounter:  none  Patient's location:  home Provider's location:  my office  CHIEF COMPLAINT: review PET scan results  CURRENT THERAPY:  PENDING Caitlyn Hendricks  ASSESSMENT & PLAN:  Caitlyn Hendricks is a 41 y.o. female with   1. 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 Biopsy on 03/08/20 revealed invasive ductal carcinoma, triple negative, metastatic to her left axillary LN. CT CAP/Bone scan negative for distant mets.  -She completed neoadjuvant chemo with ddAC q2weeks for 4 cycles 03/22/20-05/03/20. Followed by weekly carbo/taxol for 12 weeks 05/17/20-08/16/20 before proceeding with surgery.  -she started [facility] [place] for 1 year treatment on 04/28/20, but stopped in 10/2020 due to skin rash and arthralgia.   -She underwent left mastectomy with Dr Donne Hazel on 09/20/20. Surgical path shows No residual invasive carcinoma in breast status post neoadjuvant treatment, however has 7/19 positive LN.  -she began concurrent chemoRT with oral Xeloda on 10/30/20 and completed radiation on 12/11/20. She developed a rash from Xeloda, and this was held from 5/16-6/27/22. When this was restarted, she again developed a rash and joint pain. It was switched to 5-FU on 01/18/21 for 4 cycles. During that time, she was diagnosed with Lupus and put on medication. With the rash under control, she switched back to Xeloda and completed 6 months of treatment in 05/2021.  -given her triple negative disease, she is now on observation. -restaging CT CAP 06/15/21 showed no evidence of metastatic disease. -she developed skin erythema with nodularity to left chest wall. Punch biopsy of the area on 08/28/21 revealed infiltrating carcinoma, consistent with breast origin. This was found to be Her2-. -PET scan on 09/13/21 showed: hypermetabolic left level IV, left supraclavicular, and right axillary adenopathy; new nodularity in apical segment LUL and in subpleural portions of LUL and LLL, malignancy less likely. I reviewed the images and discussed these results with her today. I think her lung nodules and pleural uptake are likely metastatic disease, much less likely infection. She was started on augmentin yesterday in the ED, will let her finish the course.  -I would recommend Sacituzumab govitecan, based on the NCCN guideline.  I discussed the benefit and potential side effects with her. Chemo consent obtained today, she is scheduled to start on Monday 3/13    2. Cutaneous Lupus -  She had several episodes of rash since she started chemo treatment; skin biopsy showed a drug-related dermatitis. The rash persisted on Keytruda, Xeloda, and 5-FU. -she  underwent lab work with her dermatologist on 02/27/21. Results revealed cutaneous Lupus. She is now on medication for this. -since being on the medication for Lupus, her rash has greatly improved.   3. Anxiety, Social support, PTSD -She has a history of anxiety and has been on Xanax 51m TID and Celexa 468m -I referred her to behavioral health on 08/21/21; she is scheduled 09/04/21 -She has very good social support from mother and boyfriend. 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 -[id number]   4. Genetic Testing negative for pathogenetic mutations with VUS of POLE gene   5. COVID (+) on 08/02/20 with symptoms of Hoarseness and ribcage pain. She has recovered quickly and completely       PLAN: -PET scan reviewed  -plan to start chemo Trodelvy on 3/13, f/u on 3/20    No problem-specific Assessment & Plan notes found for this encounter.   SUMMARY OF ONCOLOGIC HISTORY: Oncology History Overview Note  Cancer Staging Cancer of central portion of left female breast ([facility] SuarezStaging form: Breast, AJCC 8th Edition - Clinical stage from 03/08/2020: Stage IIIB (cT2, cN1, cM0, G3, ER-, PR-, HER2-) - Signed by FeTruitt MerleMD on 03/10/2020 Stage prefix: Initial diagnosis Histologic grading system: 3 grade system - Pathologic stage from 09/20/2020: No Stage Recommended (ypT0, pN2a, cM0, G3, ER-, PR-, HER2-) - Signed by CaGardenia PhlegmNP on 10/04/2020 Stage prefix: Post-therapy Histologic grading system: 3 grade system    Cancer of central portion of left female breast ([facility] 02/23/2020 Mammogram   Diagnostic Mammogram 02/23/20  IMPRESSION The 2x1x2.6cm irregular mass in teh left breast at 12:00 posiiton middle depth is highly suspicious of malignancy. An [place] recommended for further evaluation and biopsy planning purposes.   02/23/2020 Breast [place] breast 02/23/20  IMPRESSION 2 adjacent spiculated masses in the left brast at  12:00 position 3 cm from the nipple (2.1x0.9x1.1cm and 1.1x1.4x0.5cm) is suggestive of malignancy.   Multiple abnormal left subpectoral and left axillary nodes measuring 3.3x2.1 cm concerning for metastatic adenopathy.    Left breast skin thickening and edema may be secondary congestive edema due to extensive axillary adenopathy.    03/08/2020 Initial Biopsy   Diagnosis 1.Breast, left, needle core biopsy, 12:00 position, 3cmfn -INVASIVE DUCTAL CARCINOMA -SEE COMMENT  2. Lymph node, needle/core biopsy, left axilla -METASTATIC CARCINOMA INVOLVING A LYMPH NODE  -LYMPHOVASCULAR SPACE INVASION PRESENT   Microscopic Comment  1.Based on the biopsy the carcinoma appears Nottingham Grade 3 or 3 and measures 1 cm in the greatest linear extent.    03/08/2020 Receptors her2   ER- Negative 0% PR - Negative 0% HER2 - Negative  KI 67 - 80%    03/08/2020 Cancer Staging   Staging form: Breast, AJCC 8th Edition - Clinical stage from 03/08/2020: Stage IIIB (cT2, cN1, cM0, G3, ER-, PR-, HER2-) - Signed by FeTruitt MerleMD on 03/10/2020    03/10/2020 Initial Diagnosis   Cancer of central portion of left female breast ([facility]  03/16/2020 Breast MRI   IMPRESSION: 1. 8.1 x 7.8 x 6.6 cm biopsy proven invasive ductal carcinoma in the central right breast, involving 3 quadrants. 2. 3.0 x 1.7 x 1.1 cm satellite mass more inferiorly in lower inner quadrant of the left breast, compatible with  additional malignancy. 3. Metastatic level 1 and level 2 left axillary lymph nodes. 4. No evidence of malignancy on the right.   03/17/2020 Imaging   IMPRESSION: CT CAP w contrast  1. Diffuse skin thickening in the left breast with left axillary and subpectoral lymphadenopathy, as well as a mildly enlarged left supraclavicular lymph node, which likely represents metastatic lymphadenopathy. No other definite extra nodal metastatic disease noted elsewhere in the chest, abdomen or pelvis. 2. Large mass in the central anatomic  pelvis which is of uncertain origin, potentially a large exophytic fibroid or a large solid mass arising from the right ovary. Further evaluation with pelvic ultrasound is strongly recommended.   03/21/2020 Imaging   Bone Scan  IMPRESSION: Apparent arthropathy at L5. No bony metastatic disease is demonstrable on this study. Scattered foci of abnormal uptake in a pelvic mass is of uncertain etiology given absence of calcification in this mass by CT. This mass compresses the urinary bladder. It is possible that some of the increased uptake in this area actually represents physiologic uptake within the bladder.   Kidneys noted in flank positions bilaterally.     03/22/2020 - 08/16/2020 Neo-Adjuvant Chemotherapy   Neoadjuvant Adriamycin and Cytoxan q2weeks for 4 cycles starting 03/22/20-05/03/20 followed by weekly Taxol and Carboplatin for 12 weeks starting 05/17/20-08/16/20   03/24/2020 Imaging   US Pelvis  IMPRESSION: 1. Large pedunculated lesion directly contiguous with the uterine most suggestive of a large subserosal fibroid which measures up to 8.2 cm. 2. Additional 1 cm probable intramural fibroid in the right anterior uterine body. 3. No other acute or worrisome pelvic abnormality.   03/29/2020 Genetic Testing   Negative genetic testing on the common hereditary cancer panel.  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  The Common Hereditary Gene Panel offered by [facility] includes sequencing and/or deletion duplication testing of the following 47 genes: APC, ATM, AXIN2, BARD1, BMPR1A, BRCA1, BRCA2, BRIP1, CDH1, CDK4, CDKN2A (p14ARF), CDKN2A (p16INK4a), CHEK2, CTNNA1, DICER1, EPCAM (Deletion/duplication testing only), GREM1 (promoter region deletion/duplication testing only), KIT, MEN1, MLH1, MSH2, MSH3, MSH6, MUTYH, NBN, NF1, NHTL1, PALB2, PDGFRA, PMS2, POLD1, POLE, PTEN, RAD50, RAD51C, RAD51D, SDHB, SDHC, SDHD, SMAD4, SMARCA4. STK11, TP53, TSC1, TSC2, and VHL.  The following genes were evaluated  for sequence changes only: SDHA and HOXB13 c.251G>A variant only. The report date is 03/29/2020   04/28/2020 - 10/27/2020 Antibody Plan   Added Keytruda q3weeks starting 04/28/20 to complete 1 year of treatment. Held since 10/27/20 due to skin rash and body aches.    08/17/2020 Breast MRI   IMPRESSION: 1. The biopsy proven malignancy in the left breast and the adjacent satellite lesion have resolved in the interval. No abnormalities in these locations today. 2. No MRI evidence of malignancy in the right breast. 3. No adenopathy identified today   09/20/2020 Surgery   LEFT MODIFIED RADICAL MASTECTOMY by Dr Donne Hazel   09/20/2020 Pathology Results   FINAL MICROSCOPIC DIAGNOSIS:   A. BREAST, LEFT, MODIFIED RADICAL MASTECTOMY:  - No residual invasive carcinoma in breast status post neoadjuvant  treatment.  - Metastatic carcinoma in (2) of (14) lymph nodes.  - Biopsy sites (one in breast, one in one of the positive lymph nodes).  - See oncology table.   B. ADDITIONAL AXILLARY CONTENTS, LEFT, DISSECTION:  - Metastatic carcinoma in (5) of (5) lymph nodes.    09/20/2020 Cancer Staging   Staging form: Breast, AJCC 8th Edition - Pathologic stage from 09/20/2020: No Stage Recommended (ypT0, pN2a, cM0, G3, ER-, PR-, HER2-) -  Signed by Gardenia Phlegm, NP on 10/04/2020 Stage prefix: Post-therapy Histologic grading system: 3 grade system    10/30/2020 - 12/11/2020 Radiation Therapy   Adjuvant Radiation with Dr Sondra Come starting 10/30/20   10/30/2020 -  Chemotherapy   Adjuvant Xeloda starting 10/30/20 at 1526m BID M-F while on RT. Held since 11/20/2020 due to recurrent skin rash   01/18/2021 - 03/17/2021 Chemotherapy   Patient is on Treatment Plan : COLORECTAL 5FU / Leucovorin Modified [place] q14d x 12 cycles     03/28/2021 Imaging   CT CAP  IMPRESSION: 1. Status post left modified radical mastectomy and left axillary lymph node dissection, with no definitive findings to  suggest metastatic disease in the chest, abdomen or pelvis on today's examination. Developing postradiation fibrosis in the apex of the left upper lobe is new compared to the prior study, but typical in the setting of prior axillary radiation therapy. 2. There appears to be 2 lesions in the uterus, one of which appears slightly enlarged compared to the prior study, while the largest lesion on the prior examination has partially regressed, as detailed above. 3. Hepatic steatosis.   06/15/2021 Imaging   EXAM: CT CHEST, ABDOMEN, AND PELVIS WITH CONTRAST  IMPRESSION: 1. Stable exam. No evidence for metastatic disease in the chest, abdomen, or pelvis. 2. 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 3. Uterine fibroids.   08/28/2021 Pathology Results   Diagnosis  Skin, left chest wall, punch  - INFILTRATING CARCINOMA, CONSISTENT WITH BREAST ORIGIN Microscopic Description Sections show an infiltrating tumor composed of cords and strands of single cells arranged in a back-to-back pattern with gland-like structures. The individual tumor cells are pleomorphic with enlarged nuclei, small nucleoli and an increased mitotic index. The surrounding stroma is collagenous. No connection is seen between the tumor and the overlying dermis.  NOTE: The tumor cells are strongly positive for Cytokeratin 7 and GATA-3 staining. The combined histologic and immunohistochemical findings, in addition to the clinical history, support a diagnosis of breast carcinoma involving the skin.   09/13/2021 PET scan   IMPRESSION: 1. Hypermetabolic left level IV lower neck adenopathy and left supraclavicular and right axillary adenopathy compatible with active malignancy. 2. New nodularity or nodular airspace opacities in the apical segment left upper lobe, and in subpleural portions of the left upper lobe and adjacent left lower lobe with low to moderate activity. This could reflect findings related to  interval radiation therapy or possibly pneumonia. Malignancy is less likely given the configuration but not entirely excluded, and surveillance is suggested. 3. The soft tissue mass arising from the right uterine fundus has changed its orientation compared to prior exams, and measures 8.1 cm in long axis. This has mildly greater uptake than the myometrium with maximum SUV of 6.0. Prior characterization has suggested that this is likely a fibroid and the lesion does not appear to have substantially enlarged since earliest available comparison of 03/17/2020. Maximum SUV of 6.0 can be seen in uterine fibroids. The lesion is somewhat unusual in its size and appearance for fibroid, and surveillance is probably reasonable. 4. Accentuated activity along the vaginal vestibule/perineum, without visible CT abnormality, probably from slight urinary incontinence of urinary FDG. 5. High activity along the distal Port-A-Cath catheter, with calcifications in the SVC in the vicinity of this catheter raising suspicion for chronic mural thrombus. The very high activity around the catheter likely relates to stasis of injected FDG along fibrin sheath/chronic calcification.   09/17/2021 -  Chemotherapy  Patient is on Treatment Plan : BREAST METASTATIC Sacituzumab govitecan-hziy Caitlyn Hendricks) q21d        INTERVAL HISTORY:  Caitlyn Hendricks was contacted for a follow up of breast cancer. She was last seen by me on 08/31/21.  She reports she woke up with a very sharp pain to her left flank/rib area, prompting ED visit. She explains that the pain became worse last night while lying down and felt like it was in her lung. She continues saying she sat [place], felt something shift in her chest, and immediately felt better.   All other systems were reviewed with the patient and are negative.  MEDICAL HISTORY:  Past Medical History:  Diagnosis Date   Anemia    Anxiety    Arthritis    Cancer ([place])    Left Breast   Depression     GERD (gastroesophageal reflux disease)    Headache    History of radiation therapy 10/30/20-12/11/20   left chest wall and axilla - Dr. Gery Pray   Panic attack     SURGICAL HISTORY: Past Surgical History:  Procedure Laterality Date   MASTECTOMY MODIFIED RADICAL Left 09/20/2020   Procedure: LEFT MODIFIED RADICAL MASTECTOMY;  Surgeon: [name], MD;  Location: [facility];  Service: General;  Laterality: Left;  RNFA; PEC BLOCK;   PORTACATH PLACEMENT Right 03/21/2020   Procedure: INSERTION PORT-A-CATH WITH ULTRASOUND GUIDANCE;  Surgeon: [name], MD;  Location: [facility];  Service: General;  Laterality: Right;    I have reviewed the social history and family history with the patient and they are unchanged from previous note.  ALLERGIES:  is allergic to medroxyprogesterone and xeloda [capecitabine].  MEDICATIONS:  Current Outpatient Medications  Medication Sig Dispense Refill   ALPRAZolam (XANAX) 1 MG tablet Take 1 mg by mouth 3 (three) times daily as needed for anxiety.      amoxicillin-clavulanate (AUGMENTIN) 875-125 MG tablet Take 1 tablet by mouth every 12 (twelve) hours. 14 tablet 0   baclofen (LIORESAL) 10 MG tablet Take 1 tablet (10 mg total) by mouth 2 (two) times daily as needed for muscle spasms. (Patient not taking: Reported on 06/13/2021) 30 each 0   citalopram (CELEXA) 20 MG tablet Take 20 mg by mouth daily.     diphenhydrAMINE (BENADRYL) 25 mg capsule Take 25 mg by mouth every 6 (six) hours as needed for allergies. (Patient not taking: Reported on 06/13/2021)     diphenhydrAMINE HCl (ZZZQUIL) 50 MG/30ML LIQD Take 30 mLs by mouth at bedtime as needed (sleep). (Patient not taking: Reported on 06/13/2021)     fluticasone (FLONASE) 50 MCG/ACT nasal spray Place 2 sprays into both nostrils daily as needed for allergies or rhinitis. (Patient not taking: Reported on 06/13/2021)     hydroxychloroquine (PLAQUENIL) 200 MG tablet Take 200 mg by mouth 2 (two)  times daily.     ibuprofen (ADVIL) 200 MG tablet Take 800 mg by mouth every 8 (eight) hours as needed for moderate pain.     lidocaine-prilocaine (EMLA) cream APPLY TO AFFECTED AREA ONCE AS NEEDED (PORT ACCESS) 30 g 3   montelukast (SINGULAIR) 10 MG tablet TAKE 1 TABLET BY MOUTH EVERYDAY AT BEDTIME (Patient not taking: Reported on 06/13/2021) 30 tablet 0   triamcinolone ointment (KENALOG) 0.5 % Apply 1 application topically 2 (two) times daily. (Patient not taking: Reported on 06/13/2021) 30 g 2   No current facility-administered medications for this visit.   Facility-Administered Medications Ordered in Other Visits  Medication Dose Route  Frequency Provider Last Rate Last Admin   sodium chloride flush (NS) 0.9 % injection 10 mL  10 mL Intravenous PRN Alla Feeling, NP   10 mL at 03/28/20 1104    PHYSICAL EXAMINATION: ECOG PERFORMANCE STATUS: 1 - Symptomatic but completely ambulatory  There were no vitals filed for this visit. Wt Readings from Last 3 Encounters:  09/13/21 227 lb 1.2 oz (103 kg)  08/21/21 227 lb 8 oz (103.2 kg)  07/31/21 230 lb (104.3 kg)     No vitals taken today, Exam not performed today  LABORATORY DATA:  I have reviewed the data as listed CBC Latest Ref Rng & Units 09/13/2021 08/21/2021 07/30/2021  WBC 4.0 - 10.5 K/uL 8.1 6.5 6.4  Hemoglobin 12.0 - 15.0 g/dL 12.2 12.9 12.0  Hematocrit 36.0 - 46.0 % 37.2 39.4 36.1  Platelets 150 - 400 K/uL 340 366 341     CMP Latest Ref Rng & Units 09/13/2021 08/21/2021 07/30/2021  Glucose 70 - 99 mg/dL 101(H) 87 141(H)  BUN 6 - 20 mg/dL 11 26(H) 14  Creatinine 0.44 - 1.00 mg/dL 0.79 0.81 0.76  Sodium 135 - 145 mmol/L 140 135 138  Potassium 3.5 - 5.1 mmol/L 3.6 4.2 4.0  Chloride 98 - 111 mmol/L 106 101 104  CO2 22 - 32 mmol/L 27 26 27   Calcium 8.9 - 10.3 mg/dL 9.1 9.5 9.2  Total Protein 6.5 - 8.1 g/dL 7.0 7.6 7.2  Total Bilirubin 0.3 - 1.2 mg/dL 0.5 0.3 0.5  Alkaline Phos 38 - 126 U/L 148(H) 120 145(H)  AST 15 - 41 U/L 17 20  18   ALT 0 - 44 U/L 21 23 17       RADIOGRAPHIC STUDIES: I have personally reviewed the radiological images as listed and agreed with the findings in the report. DG Chest 2 View  Result Date: 09/13/2021 CLINICAL DATA:  Shortness of breath EXAM: CHEST - 2 VIEW COMPARISON:  None. FINDINGS: Patchy opacities in the upper left lung. No pleural effusion or pneumothorax. Cardiomediastinal contours are within normal limits. IMPRESSION: Patchy opacities in the upper left lung may be infectious/inflammatory or may reflect metastatic disease. Electronically Signed   By: Macy Mis M.D.   On: 09/13/2021 12:38   CT Angio Chest Pulmonary Embolism (PE) W or WO Contrast  Result Date: 09/13/2021 CLINICAL DATA:  Pleuritic left-sided chest pain, concern for pulmonary embolism EXAM: CT ANGIOGRAPHY CHEST WITH CONTRAST TECHNIQUE: Multidetector CT imaging of the chest was performed using the standard protocol during bolus administration of intravenous contrast. Multiplanar CT image reconstructions and MIPs were obtained to evaluate the vascular anatomy. RADIATION DOSE REDUCTION: This exam was performed according to the departmental dose-optimization program which includes automated exposure control, adjustment of the mA and/or kV according to patient size and/or use of iterative reconstruction technique. CONTRAST:  35m OMNIPAQUE IOHEXOL 350 MG/ML SOLN COMPARISON:  No prior CTA, correlation is made with CT chest abdomen pelvis 06/15/2021 FINDINGS: Cardiovascular: Satisfactory opacification of the pulmonary arteries to the segmental level. No evidence of pulmonary embolism. Normal heart size. No pericardial effusion. Right chest port with catheter tip approximating the mid to distal right SVC. Mediastinum/Nodes: Status post left axillary lymph node dissection. No enlarged mediastinal, hilar, or axillary lymph nodes. No acute abnormality in the thyroid, trachea, or esophagus. Lungs/Pleura: Consolidative opacities with some  surrounding ground-glass in the left apex, anterior and lateral more inferior left upper lobe, and lateral left lower lobe (series 5, image 46, 87, 123, and 140). Linear opacities in the  inferior right lower lobe, most likely atelectasis. No pleural effusion or pneumothorax. Upper Abdomen: No acute abnormality. Musculoskeletal: Status post left mastectomy with unchanged appearance of the chest wall and soft tissues. Review of the MIP images confirms the above findings. IMPRESSION: 1. Negative for pulmonary embolism. 2. Consolidative opacities with surrounding ground-glass in multiple locations in the left lung, concerning for multifocal infection, including possibly atypical or fungal infection. Recommend follow-up CT in 3 months to ensure resolution. Electronically Signed   By: Merilyn Baba M.D.   On: 09/13/2021 12:47   NM PET Image Initial (PI) Skull Base To Thigh  Result Date: 09/13/2021 CLINICAL DATA:  Subsequent treatment strategy for left breast cancer. EXAM: NUCLEAR MEDICINE PET SKULL BASE TO THIGH TECHNIQUE: 11.2 mCi F-18 FDG was injected intravenously. Full-ring PET imaging was performed from the skull base to thigh after the radiotracer. CT data was obtained and used for attenuation correction and anatomic localization. Fasting blood glucose: 100 mg/dl COMPARISON:  Multiple exams, including CT chest 06/15/2021 FINDINGS: Mediastinal blood pool activity: SUV max 3.2 Liver activity: SUV max NA NECK: Symmetric glottic and palatine tonsillar activity, likely physiologic. A left level IV lymph node measuring 0.9 cm in short axis on image 41 series 4 has maximum SUV of 16.5, compatible with malignancy. Incidental CT findings: none CHEST: Hypermetabolic left supraclavicular and right axillary adenopathy observed, index left supraclavicular node measuring 0.5 cm in short axis on image 49 series 4 with maximum SUV 6.4. Index right axillary lymph node 0.7 cm in short axis on image 72 series 4 with maximum SUV  7.1. There is very high activity in the right upper SVC along the distal Port-A-Cath, with associated calcification in this vicinity suggesting chronic mural thrombus/chronic fibrin sheath. Maximum SUV in this vicinity is 235.6, strongly favoring concentrated FDG from injection along the fibrin sheath/chronic calcification. There is also some mild activity in the Port-A-Cath and in the Port-A-Cath tubing suggesting that the Port-A-Cath was indeed used for the IV injection of radiopharmaceutical. Left mastectomy noted. There is low-grade activity along the cutaneous margin of the mastectomy site, representative maximum SUV 4.1, without substantial focal high activity. This likely represents postoperative activity rather than residual local malignancy. New bandlike and nodular airspace opacity in the apicoposterior segment left upper lobe on image 54 series 4, maximum SUV 4.5. New ill-defined subpleural nodularity in the left upper lobe with moderate associated activity, including a 1.8 by 0.7 cm opacity on image 65 series 4 with maximum SUV 3.3, and an ill-defined 2.9 by 1.5 cm opacity spanning along the left upper lobe and left lower lobe with maximum SUV 4.0. These could reflect local radiation therapy related findings, strictly speaking, low-grade malignancy in these locations cannot be readily excluded. Incidental CT findings: none ABDOMEN/PELVIS: Mildly accentuated activity along the vaginal vestibule/perineum, maximum SUV 8.3, technically nonspecific although probably from slight incontinence of urinary FDG. The soft tissue mass arising from the right uterine fundus has rotated somewhat compared to prior exams, and measures 8.1 by 6.7 cm on image 174 series 4, with mostly homogeneous internal uptake although with some mild heterogeneity, and uptake mildly greater than in the myometrium. Maximum SUV 6.0, representative myometrial activity 4.4. Incidental CT findings: none SKELETON: No significant abnormal  hypermetabolic activity in this region. Incidental CT findings: none IMPRESSION: 1. Hypermetabolic left level IV lower neck adenopathy and left supraclavicular and right axillary adenopathy compatible with active malignancy. 2. New nodularity or nodular airspace opacities in the apical segment left upper lobe, and in  subpleural portions of the left upper lobe and adjacent left lower lobe with low to moderate activity. This could reflect findings related to interval radiation therapy or possibly pneumonia. Malignancy is less likely given the configuration but not entirely excluded, and surveillance is suggested. 3. The soft tissue mass arising from the right uterine fundus has changed its orientation compared to prior exams, and measures 8.1 cm in long axis. This has mildly greater uptake than the myometrium with maximum SUV of 6.0. Prior characterization has suggested that this is likely a fibroid and the lesion does not appear to have substantially enlarged since earliest available comparison of 03/17/2020. Maximum SUV of 6.0 can be seen in uterine fibroids. The lesion is somewhat unusual in its size and appearance for fibroid, and surveillance is probably reasonable. 4. Accentuated activity along the vaginal vestibule/perineum, without visible CT abnormality, probably from slight urinary incontinence of urinary FDG. 5. High activity along the distal Port-A-Cath catheter, with calcifications in the SVC in the vicinity of this catheter raising suspicion for chronic mural thrombus. The very high activity around the catheter likely relates to stasis of injected FDG along fibrin sheath/chronic calcification. Electronically Signed   By: Van Clines M.D.   On: 09/13/2021 15:01      No orders of the defined types were placed in this encounter.  All questions were answered. The patient knows to call the clinic with any problems, questions or concerns. No barriers to learning was detected. The total time spent  in the appointment was 25 minutes.     Truitt Merle, MD 09/14/2021   I, Wilburn Mylar, am acting as scribe for Truitt Merle, MD.   I have reviewed the above documentation for accuracy and completeness, and I agree with the above.

## 2021-09-15 ENCOUNTER — Encounter: Payer: Self-pay | Admitting: Hematology

## 2021-09-17 ENCOUNTER — Other Ambulatory Visit: Payer: 59

## 2021-09-17 ENCOUNTER — Other Ambulatory Visit: Payer: Self-pay | Admitting: *Deleted

## 2021-09-17 ENCOUNTER — Inpatient Hospital Stay: Payer: Medicaid Other

## 2021-09-17 ENCOUNTER — Other Ambulatory Visit: Payer: Self-pay

## 2021-09-17 ENCOUNTER — Telehealth: Payer: Self-pay | Admitting: Hematology

## 2021-09-17 ENCOUNTER — Ambulatory Visit: Payer: 59 | Admitting: Hematology

## 2021-09-17 VITALS — BP 132/83 | HR 90 | Temp 98.0°F | Resp 17 | Wt 226.8 lb

## 2021-09-17 DIAGNOSIS — Z171 Estrogen receptor negative status [ER-]: Secondary | ICD-10-CM

## 2021-09-17 DIAGNOSIS — C50112 Malignant neoplasm of central portion of left female breast: Secondary | ICD-10-CM

## 2021-09-17 DIAGNOSIS — Z5112 Encounter for antineoplastic immunotherapy: Secondary | ICD-10-CM | POA: Diagnosis not present

## 2021-09-17 DIAGNOSIS — Z5189 Encounter for other specified aftercare: Secondary | ICD-10-CM | POA: Diagnosis not present

## 2021-09-17 DIAGNOSIS — Z5111 Encounter for antineoplastic chemotherapy: Secondary | ICD-10-CM | POA: Diagnosis present

## 2021-09-17 DIAGNOSIS — Z95828 Presence of other vascular implants and grafts: Secondary | ICD-10-CM

## 2021-09-17 LAB — CBC WITH DIFFERENTIAL/PLATELET
Abs Immature Granulocytes: 0.02 10*3/uL (ref 0.00–0.07)
Basophils Absolute: 0 10*3/uL (ref 0.0–0.1)
Basophils Relative: 0 %
Eosinophils Absolute: 0.1 10*3/uL (ref 0.0–0.5)
Eosinophils Relative: 1 %
HCT: 35.6 % — ABNORMAL LOW (ref 36.0–46.0)
Hemoglobin: 11.6 g/dL — ABNORMAL LOW (ref 12.0–15.0)
Immature Granulocytes: 0 %
Lymphocytes Relative: 8 %
Lymphs Abs: 0.5 10*3/uL — ABNORMAL LOW (ref 0.7–4.0)
MCH: 29.4 pg (ref 26.0–34.0)
MCHC: 32.6 g/dL (ref 30.0–36.0)
MCV: 90.4 fL (ref 80.0–100.0)
Monocytes Absolute: 0.7 10*3/uL (ref 0.1–1.0)
Monocytes Relative: 10 %
Neutro Abs: 5.4 10*3/uL (ref 1.7–7.7)
Neutrophils Relative %: 81 %
Platelets: 324 10*3/uL (ref 150–400)
RBC: 3.94 MIL/uL (ref 3.87–5.11)
RDW: 15.5 % (ref 11.5–15.5)
WBC: 6.8 10*3/uL (ref 4.0–10.5)
nRBC: 0 % (ref 0.0–0.2)

## 2021-09-17 LAB — COMPREHENSIVE METABOLIC PANEL
ALT: 19 U/L (ref 0–44)
AST: 17 U/L (ref 15–41)
Albumin: 3.5 g/dL (ref 3.5–5.0)
Alkaline Phosphatase: 135 U/L — ABNORMAL HIGH (ref 38–126)
Anion gap: 7 (ref 5–15)
BUN: 10 mg/dL (ref 6–20)
CO2: 27 mmol/L (ref 22–32)
Calcium: 9.1 mg/dL (ref 8.9–10.3)
Chloride: 104 mmol/L (ref 98–111)
Creatinine, Ser: 0.77 mg/dL (ref 0.44–1.00)
GFR, Estimated: >60 mL/min (ref >60)
Glucose, Bld: 94 mg/dL (ref 70–99)
Potassium: 3.9 mmol/L (ref 3.5–5.1)
Sodium: 138 mmol/L (ref 135–145)
Total Bilirubin: 0.5 mg/dL (ref 0.3–1.2)
Total Protein: 6.7 g/dL (ref 6.5–8.1)

## 2021-09-17 MED ORDER — PALONOSETRON HCL INJECTION 0.25 MG/5ML
0.2500 mg | Freq: Once | INTRAVENOUS | Status: AC
  Administered 2021-09-17: 0.25 mg via INTRAVENOUS
  Filled 2021-09-17: qty 5

## 2021-09-17 MED ORDER — ACETAMINOPHEN 325 MG PO TABS
650.0000 mg | ORAL_TABLET | Freq: Once | ORAL | Status: AC
  Administered 2021-09-17: 650 mg via ORAL
  Filled 2021-09-17: qty 2

## 2021-09-17 MED ORDER — ATROPINE SULFATE 1 MG/ML IV SOLN
0.5000 mg | Freq: Once | INTRAVENOUS | Status: AC | PRN
  Administered 2021-09-17: 0.5 mg via INTRAVENOUS
  Filled 2021-09-17: qty 1

## 2021-09-17 MED ORDER — SODIUM CHLORIDE 0.9 % IV SOLN
10.0000 mg | Freq: Once | INTRAVENOUS | Status: AC
  Administered 2021-09-17: 10 mg via INTRAVENOUS
  Filled 2021-09-17: qty 10

## 2021-09-17 MED ORDER — BACLOFEN 10 MG PO TABS: 10.0000 mg | ORAL_TABLET | Freq: Two times a day (BID) | ORAL | 0 refills | Status: DC | PRN

## 2021-09-17 MED ORDER — SODIUM CHLORIDE 0.9% FLUSH
10.0000 mL | Freq: Once | INTRAVENOUS | Status: AC
  Administered 2021-09-17: 10 mL

## 2021-09-17 MED ORDER — SODIUM CHLORIDE 0.9 % IV SOLN
10.0000 mg/kg | Freq: Once | INTRAVENOUS | Status: AC
  Administered 2021-09-17: 1080 mg via INTRAVENOUS
  Filled 2021-09-17: qty 108

## 2021-09-17 MED ORDER — SODIUM CHLORIDE 0.9 % IV SOLN: Freq: Once | INTRAVENOUS | Status: AC

## 2021-09-17 MED ORDER — HEPARIN SOD (PORK) LOCK FLUSH 100 UNIT/ML IV SOLN
500.0000 [IU] | Freq: Once | INTRAVENOUS | Status: AC | PRN
  Administered 2021-09-17: 500 [IU]

## 2021-09-17 MED ORDER — SODIUM CHLORIDE 0.9% FLUSH
10.0000 mL | INTRAVENOUS | Status: DC | PRN
  Administered 2021-09-17: 10 mL

## 2021-09-17 MED ORDER — LIDOCAINE-PRILOCAINE 2.5-2.5 % EX CREA: TOPICAL_CREAM | CUTANEOUS | 3 refills | Status: DC

## 2021-09-17 MED ORDER — DEXAMETHASONE 4 MG PO TABS: ORAL_TABLET | ORAL | 1 refills | Status: DC

## 2021-09-17 MED ORDER — FAMOTIDINE IN NACL 20-0.9 MG/50ML-% IV SOLN
20.0000 mg | Freq: Once | INTRAVENOUS | Status: AC
  Administered 2021-09-17: 20 mg via INTRAVENOUS
  Filled 2021-09-17: qty 50

## 2021-09-17 MED ORDER — DIPHENHYDRAMINE HCL 50 MG/ML IJ SOLN
50.0000 mg | Freq: Once | INTRAMUSCULAR | Status: AC
  Administered 2021-09-17: 50 mg via INTRAVENOUS
  Filled 2021-09-17: qty 1

## 2021-09-17 MED ORDER — SODIUM CHLORIDE 0.9 % IV SOLN
150.0000 mg | Freq: Once | INTRAVENOUS | Status: AC
  Administered 2021-09-17: 150 mg via INTRAVENOUS
  Filled 2021-09-17: qty 150

## 2021-09-17 NOTE — Progress Notes (Signed)
Pharmacist Chemotherapy Monitoring - Initial Assessment   ? ?Anticipated start date: 09/17/21  ? ?The following has been reviewed per standard work regarding the patient's treatment regimen: ?The patient's diagnosis, treatment plan and drug doses, and organ/hematologic function ?Lab orders and baseline tests specific to treatment regimen  ?The treatment plan start date, drug sequencing, and pre-medications ?Prior authorization status  ?Patient's documented medication list, including drug-drug interaction screen and prescriptions for anti-emetics and supportive care specific to the treatment regimen ?The drug concentrations, fluid compatibility, administration routes, and timing of the medications to be used ?The patient's access for treatment and lifetime cumulative dose history, if applicable  ?The patient's medication allergies and previous infusion related reactions, if applicable  ? ?Changes made to treatment plan:  ?N/A ? ?Follow up needed:  ?N/A ? ?Benn Moulder, PharmD ?Pharmacy Resident  ?09/17/2021 ?9:08 AM ? ? ?

## 2021-09-17 NOTE — Telephone Encounter (Signed)
Scheduled follow-up appointments per 3/10 los. Patient is aware. ?

## 2021-09-17 NOTE — Patient Instructions (Signed)
[facility]  Discharge Instructions: Thank you for choosing [facility] to provide your oncology and hematology care.   If you have a lab appointment with the [facility], please go directly to the [facility] and check in at the registration area.   Wear comfortable clothing and clothing appropriate for easy access to any Portacath or PICC line.   We strive to give you quality time with your provider. You may need to reschedule your appointment if you arrive late (15 or more minutes).  Arriving late affects you and other patients whose appointments are after yours.  Also, if you miss three or more appointments without notifying the office, you may be dismissed from the clinic at the providers discretion.      For prescription refill requests, have your pharmacy contact our office and allow 72 hours for refills to be completed.    Today you received the following chemotherapy and/or immunotherapy agents: [name]      To help prevent nausea and vomiting after your treatment, we encourage you to take your nausea medication as directed.  BELOW ARE SYMPTOMS THAT SHOULD BE REPORTED IMMEDIATELY: *FEVER GREATER THAN 100.4 F (38 C) OR HIGHER *CHILLS OR SWEATING *NAUSEA AND VOMITING THAT IS NOT CONTROLLED WITH YOUR NAUSEA MEDICATION *UNUSUAL SHORTNESS OF BREATH *UNUSUAL BRUISING OR BLEEDING *URINARY PROBLEMS (pain or burning when urinating, or frequent urination) *BOWEL PROBLEMS (unusual diarrhea, constipation, pain near the anus) TENDERNESS IN MOUTH AND THROAT WITH OR WITHOUT PRESENCE OF ULCERS (sore throat, sores in mouth, or a toothache) UNUSUAL RASH, SWELLING OR PAIN  UNUSUAL VAGINAL DISCHARGE OR ITCHING   Items with * indicate a potential emergency and should be followed up as soon as possible or go to the Emergency Department if any problems should occur.  Please show the CHEMOTHERAPY ALERT CARD or IMMUNOTHERAPY ALERT CARD at check-in to  the Emergency Department and triage nurse.  Should you have questions after your visit or need to cancel or reschedule your appointment, please contact [facility]  Dept: [phone]  and follow the prompts.  Office hours are 8:00 a.m. to 4:30 p.m. Monday - Friday. Please note that voicemails left after 4:00 p.m. may not be returned until the following business day.  We are closed weekends and major holidays. You have access to a nurse at all times for urgent questions. Please call the main number to the clinic Dept: [phone] and follow the prompts.   For any non-urgent questions, you may also contact your provider using MyChart. We now offer e-Visits for anyone 72 and older to request care online for non-urgent symptoms. For details visit [email or web address].   Also download the MyChart app! Go to the app store, search "MyChart", open the app, select [facility], and log in with your MyChart username and password.  Due to Covid, a mask is required upon entering the hospital/clinic. If you do not have a mask, one will be given to you upon arrival. For doctor visits, patients may have 1 support person aged 42 or older with them. For treatment visits, patients cannot have anyone with them due to current Covid guidelines and our immunocompromised population.   Sacituzumab Govitecan Injection What is this medication? SACITUZUMAB GOVITECAN (SAK i TOOZ ue mab GOE vi TEE kan) is a monoclonal antibody combined with chemotherapy. It is used to treat breast cancer and urothelial cancer. This medicine may be used for other purposes; ask your health care provider or  pharmacist if you have questions. COMMON BRAND NAME(S): TRODELVY What should I tell my care team before I take this medication? They need to know if you have any of these conditions: diarrhea infection (especially a virus infection such as chickenpox, cold sores, or herpes) liver disease low blood counts,  like low white cell, platelet, or red cell counts an unusual or allergic reaction to sacituzumab govitecan, other medicines, foods, dyes, or preservatives pregnant or trying to get pregnant breast-feeding How should I use this medication? This medicine is for infusion into a vein. It is given by a healthcare professional in a hospital or clinic setting. Talk to your pediatrician about the use of this medicine in children. Special care may be needed. Overdosage: If you think you have taken too much of this medicine contact a poison control center or emergency room at once. NOTE: This medicine is only for you. Do not share this medicine with others. What if I miss a dose? Keep appointments for follow-up doses. It is important not to miss your dose. Call your doctor or health care professional if you are unable to keep an appointment. What may interact with this medication? certain antivirals for HIV or hepatitis gemfibrozil This list may not describe all possible interactions. Give your health care provider a list of all the medicines, herbs, non-prescription drugs, or dietary supplements you use. Also tell them if you smoke, drink alcohol, or use illegal drugs. Some items may interact with your medicine. What should I watch for while using this medication? Your condition will be monitored carefully while you are receiving this medicine. You may need blood work done while you are taking this medicine. This medicine can cause serious allergic reactions. To reduce your risk, you may need to take medicine before treatment with this medicine. Take your medicine as directed. Do not become pregnant while taking this medicine or for 6 months after stopping it. Women should inform their health care professional if they wish to become pregnant or think they might be pregnant. Men should not father a child while taking this medicine and for 3 months after stopping it. There is potential for serious side  effects to an unborn child. Talk to your health care professional for more information. Do not breast-feed a child while taking this medicine or for 1 month after stopping it. This medicine may increase your risk of getting an infection. Call your health care professional for advice if you get a fever, chills, or sore throat, or other symptoms of a cold or flu. Do not treat yourself. Try to avoid being around people who are sick. Avoid taking medicines that contain aspirin, acetaminophen, ibuprofen, naproxen, or ketoprofen unless instructed by your health care professional. These medicines may hide a fever. This medicine has caused ovarian failure in some women. This medicine may make it more difficult to get pregnant. Talk to your health care professional if you are concerned about your fertility. What side effects may I notice from receiving this medication? Side effects that you should report to your doctor or health care professional as soon as possible: allergic reactions like skin rash, itching or hives; swelling of the face, lips, or tongue diarrhea nausea/vomiting signs and symptoms of infection like fever; chills; cough; sore throat; pain or trouble passing urine signs and symptoms of low red blood cells or anemia such as unusually weak or tired; feeling faint or lightheaded; falls; breathing problems Side effects that usually do not require medical attention (report these to  your doctor or health care professional if they continue or are bothersome): constipation hair loss headache loss of appetite signs and symptoms of high blood sugar such as being more thirsty or hungry or having to urinate more than normal. You may also feel very tired or have blurry vision. trouble sleeping This list may not describe all possible side effects. Call your doctor for medical advice about side effects. You may report side effects to FDA at 1-800-FDA-1088. Where should I keep my medication? This  medicine is given in a hospital or clinic and will not be stored at home. NOTE: This sheet is a summary. It may not cover all possible information. If you have questions about this medicine, talk to your doctor, pharmacist, or health care provider.  2022 Elsevier/Gold Standard (2019-10-26 00:00:00)

## 2021-09-18 ENCOUNTER — Telehealth: Payer: Self-pay | Admitting: *Deleted

## 2021-09-18 NOTE — Telephone Encounter (Signed)
Called pt to see how she did with recent treatment.  She reports being tired but no other symptoms.  She states that her breathing is better.  She knows how to reach [place] & also knows her next appt.   ?

## 2021-09-18 NOTE — Telephone Encounter (Signed)
-----   Message from Charleston Poot, RN sent at 09/17/2021  3:13 PM EDT ----- ?Regarding: First Time/ Trodelvy/ Dr [name] pt ?Hello, ? ?Pt had first time [facility] today. Tolerated well. ? ?Thanks, ?Libbie K. ? ?

## 2021-09-19 ENCOUNTER — Inpatient Hospital Stay: Payer: Medicaid Other | Admitting: Hematology

## 2021-09-24 ENCOUNTER — Other Ambulatory Visit: Payer: Self-pay

## 2021-09-24 ENCOUNTER — Inpatient Hospital Stay: Payer: Medicaid Other | Admitting: Hematology

## 2021-09-24 ENCOUNTER — Encounter: Payer: Self-pay | Admitting: Hematology

## 2021-09-24 ENCOUNTER — Other Ambulatory Visit: Payer: Self-pay | Admitting: Hematology

## 2021-09-24 ENCOUNTER — Inpatient Hospital Stay: Payer: Medicaid Other

## 2021-09-24 ENCOUNTER — Encounter: Payer: Self-pay | Admitting: Licensed Clinical Social Worker

## 2021-09-24 VITALS — BP 118/86 | HR 79 | Temp 97.5°F | Resp 16

## 2021-09-24 VITALS — BP 121/83 | HR 87 | Temp 98.7°F | Resp 18 | Ht 64.0 in | Wt 227.6 lb

## 2021-09-24 DIAGNOSIS — C50112 Malignant neoplasm of central portion of left female breast: Secondary | ICD-10-CM

## 2021-09-24 DIAGNOSIS — Z171 Estrogen receptor negative status [ER-]: Secondary | ICD-10-CM

## 2021-09-24 DIAGNOSIS — Z95828 Presence of other vascular implants and grafts: Secondary | ICD-10-CM

## 2021-09-24 DIAGNOSIS — Z5111 Encounter for antineoplastic chemotherapy: Secondary | ICD-10-CM | POA: Diagnosis not present

## 2021-09-24 LAB — COMPREHENSIVE METABOLIC PANEL
ALT: 27 U/L (ref 0–44)
AST: 16 U/L (ref 15–41)
Albumin: 3.5 g/dL (ref 3.5–5.0)
Alkaline Phosphatase: 128 U/L — ABNORMAL HIGH (ref 38–126)
Anion gap: 5 (ref 5–15)
BUN: 14 mg/dL (ref 6–20)
CO2: 29 mmol/L (ref 22–32)
Calcium: 8.8 mg/dL — ABNORMAL LOW (ref 8.9–10.3)
Chloride: 104 mmol/L (ref 98–111)
Creatinine, Ser: 0.68 mg/dL (ref 0.44–1.00)
GFR, Estimated: >60 mL/min (ref >60)
Glucose, Bld: 107 mg/dL — ABNORMAL HIGH (ref 70–99)
Potassium: 3.5 mmol/L (ref 3.5–5.1)
Sodium: 138 mmol/L (ref 135–145)
Total Bilirubin: 0.3 mg/dL (ref 0.3–1.2)
Total Protein: 6.9 g/dL (ref 6.5–8.1)

## 2021-09-24 LAB — CBC WITH DIFFERENTIAL/PLATELET
Abs Immature Granulocytes: 0.02 10*3/uL (ref 0.00–0.07)
Basophils Absolute: 0 10*3/uL (ref 0.0–0.1)
Basophils Relative: 1 %
Eosinophils Absolute: 0 10*3/uL (ref 0.0–0.5)
Eosinophils Relative: 2 %
HCT: 36.3 % (ref 36.0–46.0)
Hemoglobin: 11.4 g/dL — ABNORMAL LOW (ref 12.0–15.0)
Immature Granulocytes: 2 %
Lymphocytes Relative: 35 %
Lymphs Abs: 0.3 10*3/uL — ABNORMAL LOW (ref 0.7–4.0)
MCH: 28.3 pg (ref 26.0–34.0)
MCHC: 31.4 g/dL (ref 30.0–36.0)
MCV: 90.1 fL (ref 80.0–100.0)
Monocytes Absolute: 0.1 10*3/uL (ref 0.1–1.0)
Monocytes Relative: 10 %
Neutro Abs: 0.5 10*3/uL — ABNORMAL LOW (ref 1.7–7.7)
Neutrophils Relative %: 50 %
Platelets: 296 10*3/uL (ref 150–400)
RBC: 4.03 MIL/uL (ref 3.87–5.11)
RDW: 14.6 % (ref 11.5–15.5)
Smear Review: NORMAL
WBC: 1 10*3/uL — ABNORMAL LOW (ref 4.0–10.5)
nRBC: 0 % (ref 0.0–0.2)

## 2021-09-24 MED ORDER — SODIUM CHLORIDE 0.9 % IV SOLN
10.0000 mg/kg | Freq: Once | INTRAVENOUS | Status: AC
  Administered 2021-09-24: 1080 mg via INTRAVENOUS
  Filled 2021-09-24: qty 108

## 2021-09-24 MED ORDER — SODIUM CHLORIDE 0.9% FLUSH
10.0000 mL | INTRAVENOUS | Status: DC | PRN
  Administered 2021-09-24: 10 mL

## 2021-09-24 MED ORDER — FLUCONAZOLE 100 MG PO TABS: 100.0000 mg | ORAL_TABLET | Freq: Every day | ORAL | 1 refills | Status: DC

## 2021-09-24 MED ORDER — FAMOTIDINE IN NACL 20-0.9 MG/50ML-% IV SOLN
20.0000 mg | Freq: Once | INTRAVENOUS | Status: AC
  Administered 2021-09-24: 20 mg via INTRAVENOUS
  Filled 2021-09-24: qty 50

## 2021-09-24 MED ORDER — SODIUM CHLORIDE 0.9 % IV SOLN
150.0000 mg | Freq: Once | INTRAVENOUS | Status: AC
  Administered 2021-09-24: 150 mg via INTRAVENOUS
  Filled 2021-09-24: qty 150

## 2021-09-24 MED ORDER — ACETAMINOPHEN 325 MG PO TABS
650.0000 mg | ORAL_TABLET | Freq: Once | ORAL | Status: AC
  Administered 2021-09-24: 650 mg via ORAL
  Filled 2021-09-24: qty 2

## 2021-09-24 MED ORDER — DIPHENHYDRAMINE HCL 50 MG/ML IJ SOLN
50.0000 mg | Freq: Once | INTRAMUSCULAR | Status: AC
  Administered 2021-09-24: 50 mg via INTRAVENOUS
  Filled 2021-09-24: qty 1

## 2021-09-24 MED ORDER — ATROPINE SULFATE 1 MG/ML IV SOLN
0.5000 mg | Freq: Once | INTRAVENOUS | Status: AC | PRN
  Administered 2021-09-24: 0.5 mg via INTRAVENOUS
  Filled 2021-09-24: qty 1

## 2021-09-24 MED ORDER — SODIUM CHLORIDE 0.9 % IV SOLN
10.0000 mg | Freq: Once | INTRAVENOUS | Status: AC
  Administered 2021-09-24: 10 mg via INTRAVENOUS
  Filled 2021-09-24: qty 10

## 2021-09-24 MED ORDER — PALONOSETRON HCL INJECTION 0.25 MG/5ML
0.2500 mg | Freq: Once | INTRAVENOUS | Status: AC
  Administered 2021-09-24: 0.25 mg via INTRAVENOUS
  Filled 2021-09-24: qty 5

## 2021-09-24 MED ORDER — SODIUM CHLORIDE 0.9% FLUSH
10.0000 mL | Freq: Once | INTRAVENOUS | Status: AC
  Administered 2021-09-24: 10 mL

## 2021-09-24 MED ORDER — SODIUM CHLORIDE 0.9 % IV SOLN: Freq: Once | INTRAVENOUS | Status: AC

## 2021-09-24 MED ORDER — HEPARIN SOD (PORK) LOCK FLUSH 100 UNIT/ML IV SOLN
500.0000 [IU] | Freq: Once | INTRAVENOUS | Status: AC | PRN
  Administered 2021-09-24: 500 [IU]

## 2021-09-24 NOTE — Patient Instructions (Signed)
[facility]  Discharge Instructions: Thank you for choosing [facility] to provide your oncology and hematology care.   If you have a lab appointment with the Cancer Center, please go directly to the Cancer Center and check in at the registration area.   Wear comfortable clothing and clothing appropriate for easy access to any Portacath or PICC line.   We strive to give you quality time with your provider. You may need to reschedule your appointment if you arrive late (15 or more minutes).  Arriving late affects you and other patients whose appointments are after yours.  Also, if you miss three or more appointments without notifying the office, you may be dismissed from the clinic at the provider's discretion.      For prescription refill requests, have your pharmacy contact our office and allow 72 hours for refills to be completed.    Today you received the following chemotherapy and/or immunotherapy agents: Trodelvy.       To help prevent nausea and vomiting after your treatment, we encourage you to take your nausea medication as directed.  BELOW ARE SYMPTOMS THAT SHOULD BE REPORTED IMMEDIATELY: *FEVER GREATER THAN 100.4 F (38 C) OR HIGHER *CHILLS OR SWEATING *NAUSEA AND VOMITING THAT IS NOT CONTROLLED WITH YOUR NAUSEA MEDICATION *UNUSUAL SHORTNESS OF BREATH *UNUSUAL BRUISING OR BLEEDING *URINARY PROBLEMS (pain or burning when urinating, or frequent urination) *BOWEL PROBLEMS (unusual diarrhea, constipation, pain near the anus) TENDERNESS IN MOUTH AND THROAT WITH OR WITHOUT PRESENCE OF ULCERS (sore throat, sores in mouth, or a toothache) UNUSUAL RASH, SWELLING OR PAIN  UNUSUAL VAGINAL DISCHARGE OR ITCHING   Items with * indicate a potential emergency and should be followed up as soon as possible or go to the Emergency Department if any problems should occur.  Please show the CHEMOTHERAPY ALERT CARD or IMMUNOTHERAPY ALERT CARD at check-in to  the Emergency Department and triage nurse.  Should you have questions after your visit or need to cancel or reschedule your appointment, please contact [facility]  Dept: [phone]  and follow the prompts.  Office hours are 8:00 a.m. to 4:30 p.m. Monday - Friday. Please note that voicemails left after 4:00 p.m. may not be returned until the following business day.  We are closed weekends and major holidays. You have access to a nurse at all times for urgent questions. Please call the main number to the clinic Dept: [phone] and follow the prompts.   For any non-urgent questions, you may also contact your provider using MyChart. We now offer e-Visits for anyone 18 and older to request care online for non-urgent symptoms. For details visit [email or web address].   Also download the MyChart app! Go to the app store, search "MyChart", open the app, select [facility], and log in with your MyChart username and password.  Due to Covid, a mask is required upon entering the hospital/clinic. If you do not have a mask, one will be given to you upon arrival. For doctor visits, patients may have 1 support person aged 18 or older with them. For treatment visits, patients cannot have anyone with them due to current Covid guidelines and our immunocompromised population.  

## 2021-09-24 NOTE — Progress Notes (Signed)
Per Dr. [name] OK to proceed w/ ANC 0.5 today.  ?Pt is to receive Udyneca injection on 09/25/21. ?

## 2021-09-24 NOTE — Progress Notes (Signed)
?[facility]   ?[phone] [phone]   ?Clinic Follow up Note  ? ?Patient Care Team: ?[name], MD as PCP - General (Family Medicine) ?Morrison Old, NP as PCP - Dermatology (Nurse Practitioner) ?Mauro Kaufmann, RN as Oncology Nurse Navigator ?Rockwell Germany, RN as Oncology Nurse Navigator ?[name], MD as Consulting Physician (General Surgery) ?Truitt Merle, MD as Consulting Physician (Hematology) ?Gery Pray, MD as Consulting Physician (Radiation Oncology) ?Debby Bud, [place] (Physician Assistant) ? ?Date of Service:  09/24/2021 ? ?CHIEF COMPLAINT: f/u of left breast cancer ? ?CURRENT THERAPY:  ?[name], started 09/17/21 ? ?ASSESSMENT & PLAN:  ?Caitlyn Hendricks is a 41 y.o. premenopausal female with  ? ?1. 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 Biopsy on 03/08/20 revealed invasive ductal carcinoma, triple negative, metastatic to her left axillary LN. CT CAP/Bone scan negative for distant mets.  ?-She completed neoadjuvant chemo with ddAC q2weeks for 4 cycles 03/22/20-05/03/20. Followed by weekly carbo/taxol for 12 weeks 05/17/20-08/16/20 before proceeding with surgery.  ?-she started [facility] for 1 year treatment on 04/28/20, but stopped in 10/2020 due to skin rash and arthralgia.   ?-She underwent left mastectomy with Dr Donne Hazel on 09/20/20. Surgical path shows No residual invasive carcinoma in breast status post neoadjuvant treatment, however has 7/19 positive LN.  ?-she began concurrent chemoRT with oral Xeloda on 10/30/20 and completed radiation on 12/11/20. She developed a rash from Xeloda, and this was held from 5/16-6/27/22. When this was restarted, she again developed a rash and joint pain. It was switched to 5-FU on 01/18/21 for 4 cycles. During  that time, she was diagnosed with Lupus and put on medication. With the rash under control, she switched back to Xeloda and completed 6 months of treatment in 05/2021.  ?--she developed skin erythema with nodularity to left chest wall. Punch biopsy of the area on 08/28/21 revealed infiltrating carcinoma, consistent with breast origin. This was found to be Her2-. ?-PET scan on 09/13/21 showed: hypermetabolic left level IV, left supraclavicular, and right axillary adenopathy; new nodularity in apical segment LUL and in subpleural portions of LUL and LLL, concerning for infection, malignancy felt to be less likely.  ?-she started [employer] [name]) on 09/17/21. She tolerated well overall.left chest discomfort has improved ?-We discussed that her metastatic cancer is likely incurable, and the main treatment would be chemotherapy. ?-Lab reviewed, she has developed neutropenia from chemo treatment, will proceed cycle 1 day 8 treatment today, with Udenyca tomorrow. ?  ?2. Cutaneous Lupus ?-She had several episodes of rash since she started chemo treatment; skin biopsy showed a drug-related dermatitis. The rash persisted on Keytruda, Xeloda, and 5-FU. ?-she underwent lab work with her dermatologist on 02/27/21. Results revealed cutaneous Lupus. She is now on medication for this. ?-since being on the medication for Lupus, her rash has greatly improved. ?  ?3. Anxiety, Social support, PTSD ?-She has a history of anxiety and has been on Xanax 69m TID and Celexa 434m ?-she is now seeing therapist JeStormy Cardnd psychiatrist KaDebby Bud[place] Her anxiety level has gotten much worse since t recent metastatic cancer recurrence ?-She has very good social support from mother and boyfriend. 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 ?-[id number] ?-she  has been seen by a psychiatrist, who "messed [the medications] all up." ?-I have messaged our social worker Vinnie Level to  follow-up on this ?  ?4. Genetic Testing negative for pathogenetic mutations with VUS of POLE gene ?  ?5. COVID (+) on 08/02/20 with symptoms of Hoarseness and ribcage pain. She has recovered quickly and completely   ?  ?  ?PLAN: ?-proceed to second dose [place] today and [place] tomorrow ?-lab, flush, f/u, and Trodelvy in 2 and 3 weeks as scheduled ? ? ?No problem-specific Assessment & Plan notes found for this encounter. ? ? ?SUMMARY OF ONCOLOGIC HISTORY: ?Oncology History Overview Note  ?Cancer Staging ?Cancer of central portion of left female breast ([place]) ?Staging form: Breast, AJCC 8th Edition ?- Clinical stage from 03/08/2020: Stage IIIB (cT2, cN1, cM0, G3, ER-, PR-, HER2-) - Signed by Truitt Merle, MD on 03/10/2020 ?Stage prefix: Initial diagnosis ?Histologic grading system: 3 grade system ?- Pathologic stage from 09/20/2020: No Stage Recommended (ypT0, pN2a, cM0, G3, ER-, PR-, HER2-) - Signed by Gardenia Phlegm, NP on 10/04/2020 ?Stage prefix: Post-therapy ?Histologic grading system: 3 grade system ? ?  ?Cancer of central portion of left female breast ([place])  ?02/23/2020 Mammogram  ? Diagnostic Mammogram 02/23/20  ?IMPRESSION ?The 2x1x2.6cm irregular mass in teh left breast at 12:00 posiiton middle depth is highly suspicious of malignancy. An [place] is recommended for further evaluation and biopsy planning purposes. ?  ?02/23/2020 Breast US  ? [name] 02/23/20  ?IMPRESSION ?2 adjacent spiculated masses in the left brast at 12:00 position 3 cm from the nipple (2.1x0.9x1.1cm and 1.1x1.4x0.5cm) is suggestive of malignancy.  ? ?Multiple abnormal left subpectoral and left axillary nodes measuring 3.3x2.1 cm concerning for metastatic adenopathy.  ? ? ?Left breast skin thickening and edema may be secondary congestive edema due to extensive axillary adenopathy.  ?  ?03/08/2020 Initial Biopsy  ? Diagnosis ?1.Breast, left, needle core biopsy, 12:00 position, 3cmfn ?-INVASIVE DUCTAL CARCINOMA ?-SEE COMMENT ? ?2. Lymph  node, needle/core biopsy, left axilla ?-METASTATIC CARCINOMA INVOLVING A LYMPH NODE  ?-LYMPHOVASCULAR SPACE INVASION PRESENT  ? ?Microscopic Comment  ?1.Based on the biopsy the carcinoma appears Nottingham Grade 3 or 3 and measures 1 cm in the greatest linear extent.  ?  ?03/08/2020 Receptors her2  ? ER- Negative 0% ?PR - Negative 0% ?HER2 - Negative  ?KI 67 - 80% ? ?  ?03/08/2020 Cancer Staging  ? Staging form: Breast, AJCC 8th Edition ?- Clinical stage from 03/08/2020: Stage IIIB (cT2, cN1, cM0, G3, ER-, PR-, HER2-) - Signed by Truitt Merle, MD on 03/10/2020 ? ?  ?03/10/2020 Initial Diagnosis  ? Cancer of central portion of left female breast [facility]) ?  ?03/16/2020 Breast MRI  ? IMPRESSION: ?1. 8.1 x 7.8 x 6.6 cm biopsy proven invasive ductal carcinoma in the ?central right breast, involving 3 quadrants. ?2. 3.0 x 1.7 x 1.1 cm satellite mass more inferiorly in lower inner ?quadrant of the left breast, compatible with additional malignancy. ?3. Metastatic level 1 and level 2 left axillary lymph nodes. ?4. No evidence of malignancy on the right. ?  ?03/17/2020 Imaging  ? IMPRESSION: ?CT CAP w contrast  ?1. Diffuse skin thickening in the left breast with left axillary and ?subpectoral lymphadenopathy, as well as a mildly enlarged left ?supraclavicular lymph node, which likely represents metastatic ?lymphadenopathy. No other definite extra nodal metastatic disease ?noted elsewhere in the chest, abdomen or pelvis. ?2. Large mass in the central anatomic pelvis which is of uncertain ?origin, potentially a large  exophytic fibroid or a large solid mass ?arising from the right ovary. Further evaluation with pelvic ?ultrasound is strongly recommended. ?  ?03/21/2020 Imaging  ? Bone Scan  ?IMPRESSION: ?Apparent arthropathy at L5. No bony metastatic disease is ?demonstrable on this study. Scattered foci of abnormal uptake in a ?pelvic mass is of uncertain etiology given absence of calcification ?in this mass by CT. This mass compresses the  urinary bladder. It is ?possible that some of the increased uptake in this area actually ?represents physiologic uptake within the bladder. ?  ?Kidneys noted in flank positions bilaterally. ?  ?  ?03/22/2020 - 2

## 2021-09-24 NOTE — Progress Notes (Signed)
[facility] CSW Progress Note ? ?Clinical Social Worker met with patient to discuss questions regarding psychiatry.  Pt states her encounter with her new psychiatrist was not a positive one and her anxiety medication was not adjusted as she had expected.  Pt reports having a good relationship with her therapist whom she sees regularly for counseling.  CSW suggested pt's therapist speak directly w/ the psychiatrist to advocate for a change in anxiety medication.  CSW also suggested Dr. [name] could speak to pt's medical condition w/ the psychiatrist if it may be beneficial.  If pt is unable to resolve concerns regarding medication, CSW provided pt w/ a list of psychiatric providers in the area who will take Medicaid w/ instructions to retain the current psychiatrist until her first appointment to prevent a gap in medication.  Dr. [name] advised of the above.  ? ? ? ?Caitlyn Hendricks , LCSW ?

## 2021-09-25 ENCOUNTER — Telehealth: Payer: Self-pay

## 2021-09-25 ENCOUNTER — Inpatient Hospital Stay: Payer: Medicaid Other

## 2021-09-25 VITALS — BP 129/82 | HR 77 | Temp 97.8°F | Resp 18

## 2021-09-25 DIAGNOSIS — Z171 Estrogen receptor negative status [ER-]: Secondary | ICD-10-CM

## 2021-09-25 DIAGNOSIS — Z5111 Encounter for antineoplastic chemotherapy: Secondary | ICD-10-CM | POA: Diagnosis not present

## 2021-09-25 MED ORDER — PEGFILGRASTIM-CBQV 6 MG/0.6ML ~~LOC~~ SOSY
6.0000 mg | PREFILLED_SYRINGE | Freq: Once | SUBCUTANEOUS | Status: AC
  Administered 2021-09-25: 6 mg via SUBCUTANEOUS
  Filled 2021-09-25: qty 0.6

## 2021-09-25 NOTE — Telephone Encounter (Signed)
Pt LVM stating she would like something prescribed for diarrhea.  Returned pt's call but got her voicemail.  LVM stating that I received her voicemail message.  Informed pt that before prescribing any medication I must first speak with her to see what she's already taking for diarrhea and how many stools she's having per day.  Informed pt that I will also send her a MyChart message inquiring about her diarrhea.   ?

## 2021-09-25 NOTE — Patient Instructions (Signed)

## 2021-09-28 ENCOUNTER — Other Ambulatory Visit: Payer: Self-pay | Admitting: Hematology

## 2021-09-28 ENCOUNTER — Encounter (HOSPITAL_COMMUNITY): Payer: Self-pay

## 2021-09-28 ENCOUNTER — Other Ambulatory Visit: Payer: Self-pay

## 2021-09-28 ENCOUNTER — Inpatient Hospital Stay (HOSPITAL_COMMUNITY)
Admission: EM | Admit: 2021-09-28 | Discharge: 2021-10-04 | DRG: 809 | Disposition: A | Discharge status: Home / Self Care | Payer: Medicaid Other | Attending: Internal Medicine | Admitting: Internal Medicine

## 2021-09-28 ENCOUNTER — Emergency Department (HOSPITAL_COMMUNITY): Payer: Medicaid Other

## 2021-09-28 DIAGNOSIS — K219 Gastro-esophageal reflux disease without esophagitis: Secondary | ICD-10-CM | POA: Diagnosis present

## 2021-09-28 DIAGNOSIS — L270 Generalized skin eruption due to drugs and medicaments taken internally: Secondary | ICD-10-CM | POA: Diagnosis present

## 2021-09-28 DIAGNOSIS — T368X5A Adverse effect of other systemic antibiotics, initial encounter: Secondary | ICD-10-CM | POA: Diagnosis present

## 2021-09-28 DIAGNOSIS — E872 Acidosis, unspecified: Secondary | ICD-10-CM | POA: Diagnosis present

## 2021-09-28 DIAGNOSIS — D709 Neutropenia, unspecified: Principal | ICD-10-CM

## 2021-09-28 DIAGNOSIS — R040 Epistaxis: Secondary | ICD-10-CM | POA: Diagnosis not present

## 2021-09-28 DIAGNOSIS — L298 Other pruritus: Secondary | ICD-10-CM | POA: Diagnosis present

## 2021-09-28 DIAGNOSIS — E669 Obesity, unspecified: Secondary | ICD-10-CM | POA: Diagnosis present

## 2021-09-28 DIAGNOSIS — Z79899 Other long term (current) drug therapy: Secondary | ICD-10-CM

## 2021-09-28 DIAGNOSIS — C773 Secondary and unspecified malignant neoplasm of axilla and upper limb lymph nodes: Secondary | ICD-10-CM | POA: Diagnosis present

## 2021-09-28 DIAGNOSIS — D6481 Anemia due to antineoplastic chemotherapy: Secondary | ICD-10-CM | POA: Diagnosis present

## 2021-09-28 DIAGNOSIS — D701 Agranulocytosis secondary to cancer chemotherapy: Principal | ICD-10-CM | POA: Diagnosis present

## 2021-09-28 DIAGNOSIS — Z20822 Contact with and (suspected) exposure to covid-19: Secondary | ICD-10-CM | POA: Diagnosis present

## 2021-09-28 DIAGNOSIS — R5081 Fever presenting with conditions classified elsewhere: Secondary | ICD-10-CM | POA: Diagnosis not present

## 2021-09-28 DIAGNOSIS — R509 Fever, unspecified: Secondary | ICD-10-CM | POA: Diagnosis not present

## 2021-09-28 DIAGNOSIS — L930 Discoid lupus erythematosus: Secondary | ICD-10-CM | POA: Diagnosis present

## 2021-09-28 DIAGNOSIS — G4700 Insomnia, unspecified: Secondary | ICD-10-CM | POA: Diagnosis present

## 2021-09-28 DIAGNOSIS — Z87891 Personal history of nicotine dependence: Secondary | ICD-10-CM

## 2021-09-28 DIAGNOSIS — R112 Nausea with vomiting, unspecified: Secondary | ICD-10-CM | POA: Diagnosis present

## 2021-09-28 DIAGNOSIS — F418 Other specified anxiety disorders: Secondary | ICD-10-CM | POA: Diagnosis present

## 2021-09-28 DIAGNOSIS — E871 Hypo-osmolality and hyponatremia: Secondary | ICD-10-CM | POA: Diagnosis present

## 2021-09-28 DIAGNOSIS — L932 Other local lupus erythematosus: Secondary | ICD-10-CM | POA: Diagnosis present

## 2021-09-28 DIAGNOSIS — Z6838 Body mass index (BMI) 38.0-38.9, adult: Secondary | ICD-10-CM

## 2021-09-28 DIAGNOSIS — Z803 Family history of malignant neoplasm of breast: Secondary | ICD-10-CM

## 2021-09-28 DIAGNOSIS — D638 Anemia in other chronic diseases classified elsewhere: Secondary | ICD-10-CM | POA: Diagnosis present

## 2021-09-28 DIAGNOSIS — E860 Dehydration: Secondary | ICD-10-CM | POA: Diagnosis present

## 2021-09-28 DIAGNOSIS — A0472 Enterocolitis due to Clostridium difficile, not specified as recurrent: Secondary | ICD-10-CM | POA: Diagnosis present

## 2021-09-28 DIAGNOSIS — E876 Hypokalemia: Secondary | ICD-10-CM | POA: Diagnosis present

## 2021-09-28 DIAGNOSIS — T451X5A Adverse effect of antineoplastic and immunosuppressive drugs, initial encounter: Secondary | ICD-10-CM | POA: Diagnosis present

## 2021-09-28 DIAGNOSIS — F4310 Post-traumatic stress disorder, unspecified: Secondary | ICD-10-CM | POA: Diagnosis present

## 2021-09-28 DIAGNOSIS — Z9012 Acquired absence of left breast and nipple: Secondary | ICD-10-CM

## 2021-09-28 DIAGNOSIS — F32A Depression, unspecified: Secondary | ICD-10-CM | POA: Diagnosis present

## 2021-09-28 DIAGNOSIS — Z923 Personal history of irradiation: Secondary | ICD-10-CM

## 2021-09-28 DIAGNOSIS — F410 Panic disorder [episodic paroxysmal anxiety] without agoraphobia: Secondary | ICD-10-CM | POA: Diagnosis present

## 2021-09-28 DIAGNOSIS — Z171 Estrogen receptor negative status [ER-]: Secondary | ICD-10-CM

## 2021-09-28 DIAGNOSIS — R21 Rash and other nonspecific skin eruption: Secondary | ICD-10-CM

## 2021-09-28 DIAGNOSIS — D849 Immunodeficiency, unspecified: Secondary | ICD-10-CM | POA: Diagnosis present

## 2021-09-28 DIAGNOSIS — C50112 Malignant neoplasm of central portion of left female breast: Secondary | ICD-10-CM | POA: Diagnosis present

## 2021-09-28 LAB — I-STAT BETA HCG BLOOD, ED (MC, WL, AP ONLY): I-stat hCG, quantitative: <5 m[IU]/mL (ref <5)

## 2021-09-28 LAB — CBC WITH DIFFERENTIAL/PLATELET
Abs Immature Granulocytes: 0 10*3/uL (ref 0.00–0.07)
Basophils Absolute: 0 10*3/uL (ref 0.0–0.1)
Basophils Relative: 4 %
Eosinophils Absolute: 0 10*3/uL (ref 0.0–0.5)
Eosinophils Relative: 4 %
HCT: 34.1 % — ABNORMAL LOW (ref 36.0–46.0)
Hemoglobin: 11.2 g/dL — ABNORMAL LOW (ref 12.0–15.0)
Immature Granulocytes: 0 %
Lymphocytes Relative: 81 %
Lymphs Abs: 0.2 10*3/uL — ABNORMAL LOW (ref 0.7–4.0)
MCH: 29.4 pg (ref 26.0–34.0)
MCHC: 32.8 g/dL (ref 30.0–36.0)
MCV: 89.5 fL (ref 80.0–100.0)
Monocytes Absolute: 0 10*3/uL — ABNORMAL LOW (ref 0.1–1.0)
Monocytes Relative: 11 %
Neutro Abs: 0 10*3/uL — CL (ref 1.7–7.7)
Neutrophils Relative %: 0 %
Platelets: 226 10*3/uL (ref 150–400)
RBC: 3.81 MIL/uL — ABNORMAL LOW (ref 3.87–5.11)
RDW: 14.6 % (ref 11.5–15.5)
WBC: 0.3 10*3/uL — CL (ref 4.0–10.5)
nRBC: 0 % (ref 0.0–0.2)

## 2021-09-28 LAB — COMPREHENSIVE METABOLIC PANEL
ALT: 31 U/L (ref 0–44)
AST: 19 U/L (ref 15–41)
Albumin: 3.4 g/dL — ABNORMAL LOW (ref 3.5–5.0)
Alkaline Phosphatase: 122 U/L (ref 38–126)
Anion gap: 8 (ref 5–15)
BUN: 14 mg/dL (ref 6–20)
CO2: 25 mmol/L (ref 22–32)
Calcium: 8.6 mg/dL — ABNORMAL LOW (ref 8.9–10.3)
Chloride: 101 mmol/L (ref 98–111)
Creatinine, Ser: 0.77 mg/dL (ref 0.44–1.00)
GFR, Estimated: >60 mL/min (ref >60)
Glucose, Bld: 119 mg/dL — ABNORMAL HIGH (ref 70–99)
Potassium: 3.5 mmol/L (ref 3.5–5.1)
Sodium: 134 mmol/L — ABNORMAL LOW (ref 135–145)
Total Bilirubin: 0.8 mg/dL (ref 0.3–1.2)
Total Protein: 6.8 g/dL (ref 6.5–8.1)

## 2021-09-28 LAB — LIPASE, BLOOD: Lipase: 31 U/L (ref 11–51)

## 2021-09-28 LAB — RESP PANEL BY RT-PCR (FLU A&B, COVID) ARPGX2
Influenza A by PCR: NEGATIVE
Influenza B by PCR: NEGATIVE
SARS Coronavirus 2 by RT PCR: NEGATIVE

## 2021-09-28 LAB — LACTIC ACID, PLASMA
Lactic Acid, Venous: 2.4 mmol/L (ref 0.5–1.9)
Lactic Acid, Venous: 1.5 mmol/L (ref 0.5–1.9)

## 2021-09-28 IMAGING — CR DG ABDOMEN ACUTE W/ 1V CHEST
4 series · 4 of 4 positions shown · non-contrast
Comparison: [DATE], [DATE]

CLINICAL DATA: Vomiting, metastatic breast cancer

EXAM:
DG ABDOMEN ACUTE WITH 1 VIEW CHEST

[w chest pa]
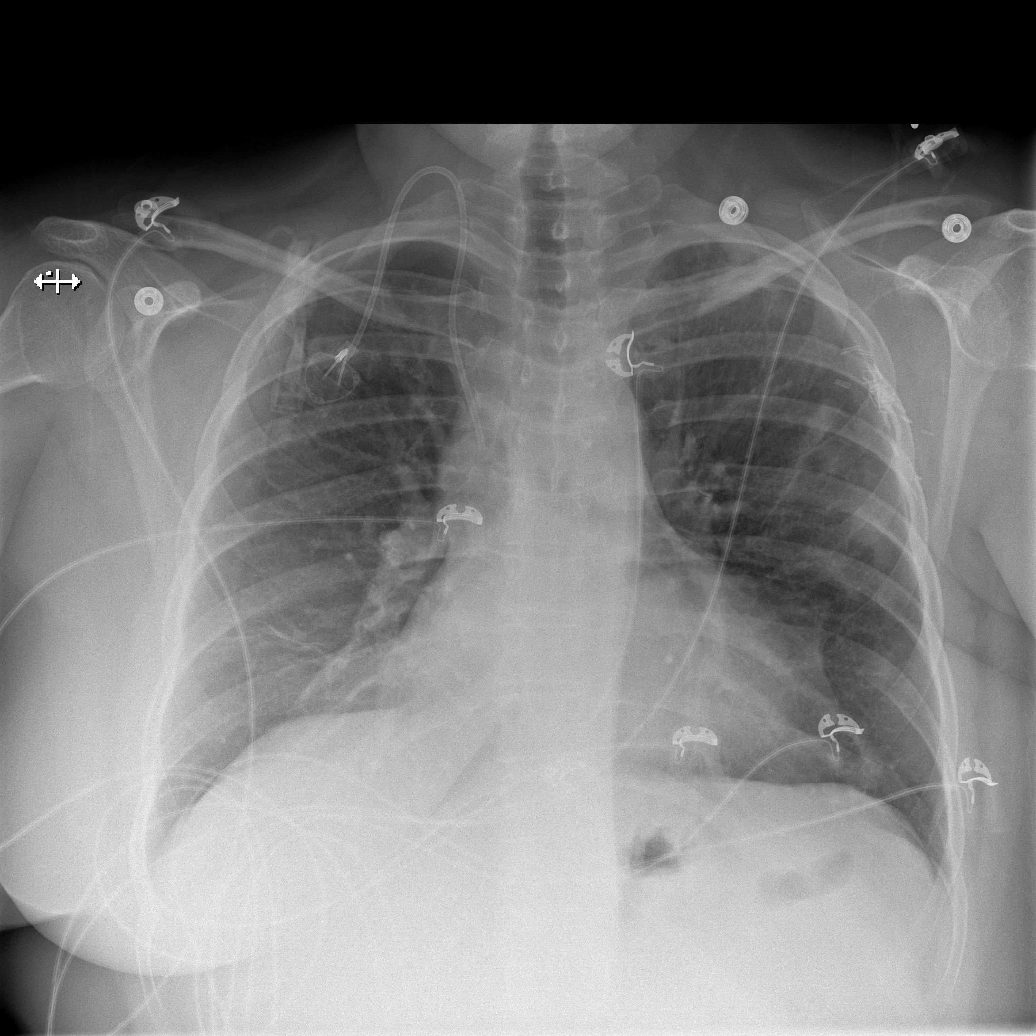

[w abdomen upright]
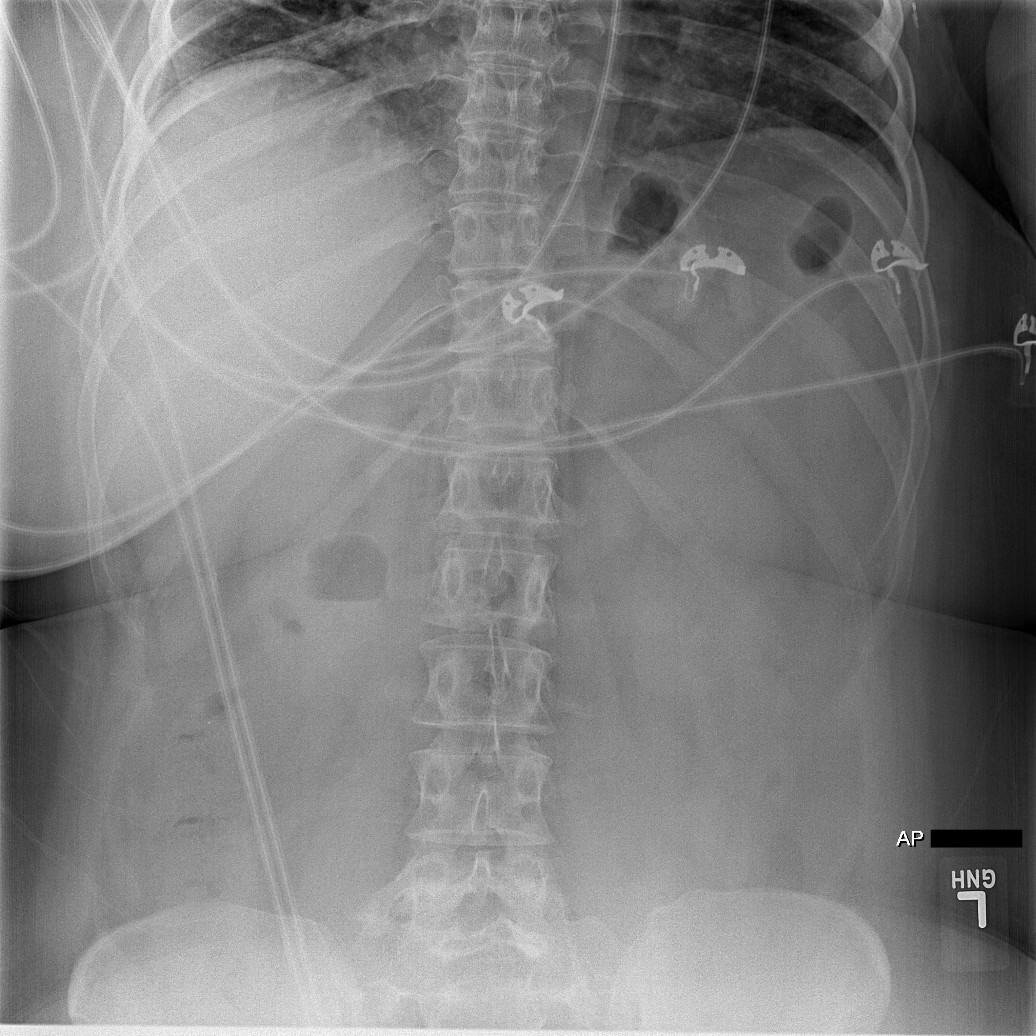

[t abdomen supine (1 of 2)]
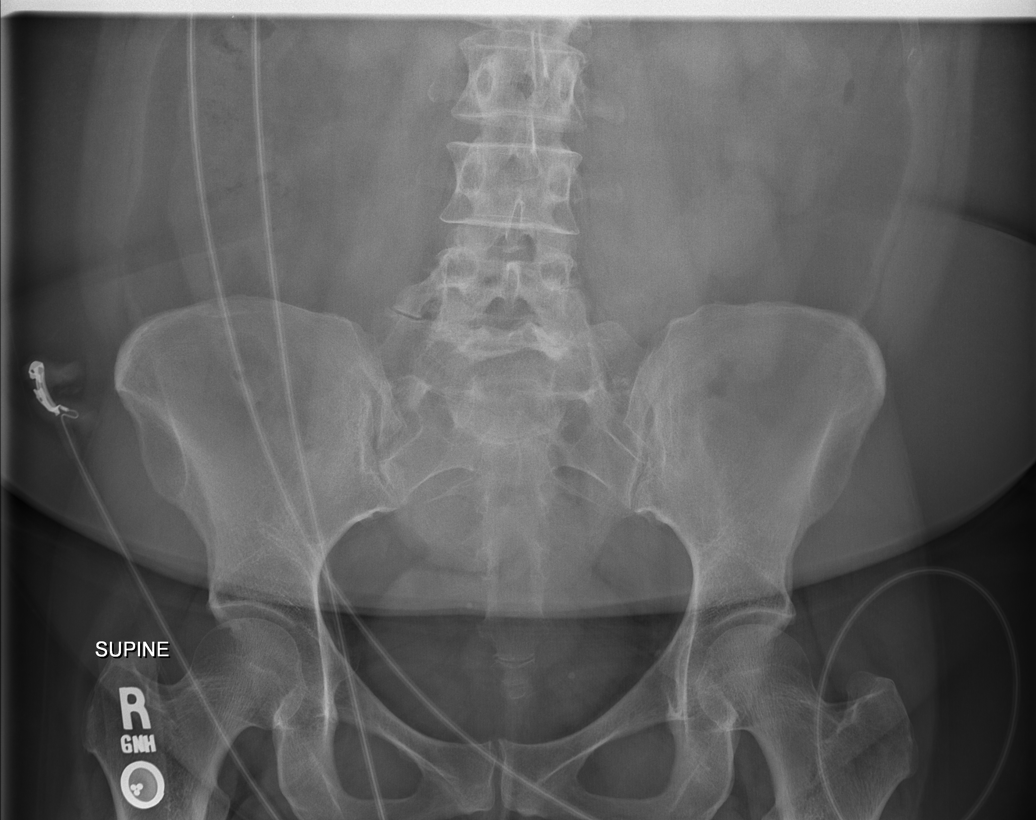

[t abdomen supine (2 of 2)]
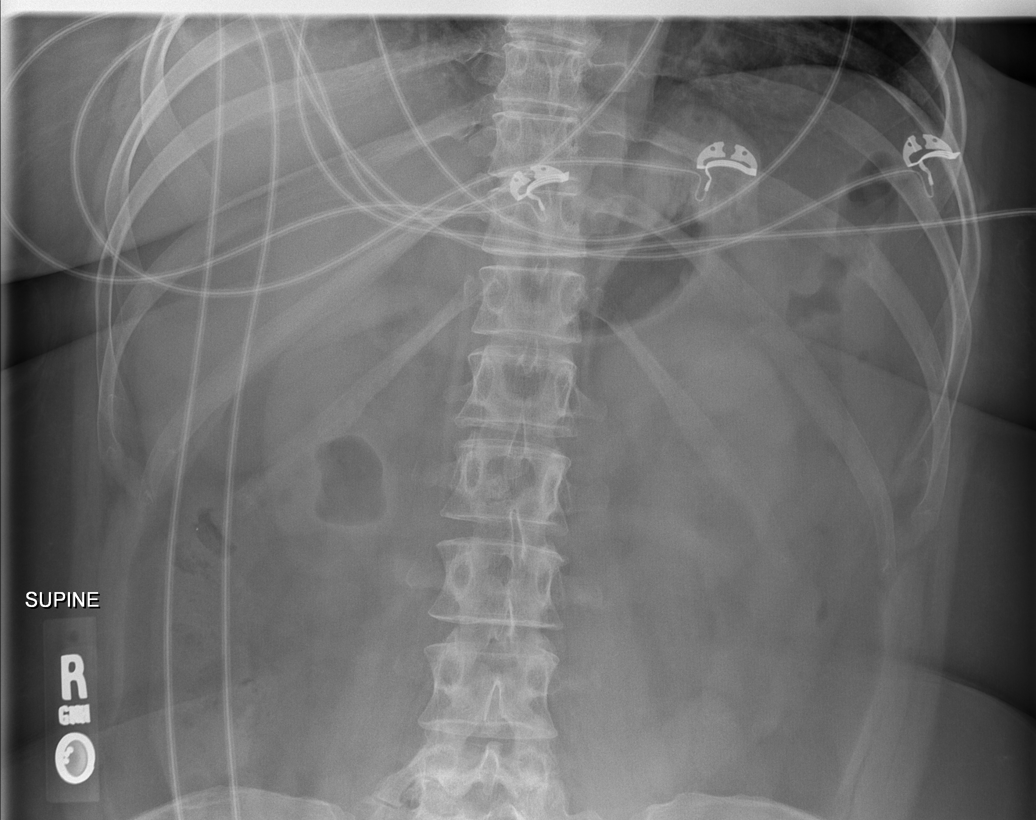

[4 of 4 positions shown; findings below may reference images not displayed]

FINDINGS: Supine and upright frontal views of the abdomen as well as an
upright frontal view of the chest are obtained. Persistent patchy
left upper lobe airspace disease not significantly changed since
prior exam. No new areas of consolidation, effusion, or
pneumothorax.

There is a relative paucity of bowel gas. No evidence of high-grade
obstruction or ileus. No masses or abnormal calcifications. No free
gas in the greater peritoneal sac.

There are no acute or destructive bony lesions. Postsurgical changes
from left mastectomy and left axillary lymph node dissection. Stable
right chest wall port.
IMPRESSION: 1. Stable left upper lobe airspace disease compatible with
inflammation or infection.
2. Paucity of bowel gas, with no evidence of high-grade obstruction
or ileus.

## 2021-09-28 MED ORDER — SODIUM CHLORIDE 0.9 % IV SOLN: INTRAVENOUS | Status: AC

## 2021-09-28 MED ORDER — HYDROXYCHLOROQUINE SULFATE 200 MG PO TABS
200.0000 mg | ORAL_TABLET | Freq: Two times a day (BID) | ORAL | Status: DC
Start: 2021-09-28 — End: 2021-09-28

## 2021-09-28 MED ORDER — ACETAMINOPHEN 650 MG RE SUPP: 650.0000 mg | Freq: Four times a day (QID) | RECTAL | Status: DC | PRN

## 2021-09-28 MED ORDER — ONDANSETRON HCL 4 MG PO TABS
4.0000 mg | ORAL_TABLET | Freq: Four times a day (QID) | ORAL | Status: DC | PRN
  Administered 2021-09-29 – 2021-09-30 (×3): 4 mg via ORAL
  Filled 2021-09-28 (×3): qty 1

## 2021-09-28 MED ORDER — DIPHENOXYLATE-ATROPINE 2.5-0.025 MG PO TABS: 1.0000 | ORAL_TABLET | Freq: Four times a day (QID) | ORAL | 1 refills | Status: DC | PRN

## 2021-09-28 MED ORDER — LORAZEPAM 2 MG/ML IJ SOLN
1.0000 mg | Freq: Once | INTRAMUSCULAR | Status: AC
  Administered 2021-09-28: 1 mg via INTRAVENOUS
  Filled 2021-09-28: qty 1

## 2021-09-28 MED ORDER — ACETAMINOPHEN 325 MG PO TABS
650.0000 mg | ORAL_TABLET | Freq: Four times a day (QID) | ORAL | Status: DC | PRN
  Administered 2021-09-28 – 2021-10-04 (×14): 650 mg via ORAL
  Filled 2021-09-28 (×14): qty 2

## 2021-09-28 MED ORDER — ENOXAPARIN SODIUM 40 MG/0.4ML IJ SOSY
40.0000 mg | PREFILLED_SYRINGE | INTRAMUSCULAR | Status: DC
  Administered 2021-09-28 – 2021-10-03 (×6): 40 mg via SUBCUTANEOUS
  Filled 2021-09-28 (×6): qty 0.4

## 2021-09-28 MED ORDER — ACETAMINOPHEN 325 MG PO TABS
650.0000 mg | ORAL_TABLET | Freq: Once | ORAL | Status: AC
  Administered 2021-09-28: 650 mg via ORAL
  Filled 2021-09-28: qty 2

## 2021-09-28 MED ORDER — SODIUM CHLORIDE 0.9 % IV SOLN
2.0000 g | INTRAVENOUS | Status: AC
  Administered 2021-09-28: 2 g via INTRAVENOUS
  Filled 2021-09-28: qty 2

## 2021-09-28 MED ORDER — SODIUM CHLORIDE 0.9% FLUSH
10.0000 mL | Freq: Two times a day (BID) | INTRAVENOUS | Status: DC
  Administered 2021-09-28 – 2021-10-03 (×9): 10 mL

## 2021-09-28 MED ORDER — SODIUM CHLORIDE 0.9 % IV BOLUS
1000.0000 mL | Freq: Once | INTRAVENOUS | Status: AC
  Administered 2021-09-28: 1000 mL via INTRAVENOUS

## 2021-09-28 MED ORDER — ALPRAZOLAM 1 MG PO TABS
1.0000 mg | ORAL_TABLET | Freq: Three times a day (TID) | ORAL | Status: DC | PRN
  Administered 2021-09-28 – 2021-10-04 (×13): 1 mg via ORAL
  Filled 2021-09-28 (×13): qty 1

## 2021-09-28 MED ORDER — ONDANSETRON HCL 4 MG/2ML IJ SOLN
4.0000 mg | Freq: Four times a day (QID) | INTRAMUSCULAR | Status: DC | PRN
  Administered 2021-09-28 – 2021-09-29 (×2): 4 mg via INTRAVENOUS
  Filled 2021-09-28 (×2): qty 2

## 2021-09-28 MED ORDER — BACLOFEN 10 MG PO TABS: 10.0000 mg | ORAL_TABLET | Freq: Two times a day (BID) | ORAL | Status: DC | PRN

## 2021-09-28 MED ORDER — CHLORHEXIDINE GLUCONATE CLOTH 2 % EX PADS
6.0000 | MEDICATED_PAD | Freq: Every day | CUTANEOUS | Status: DC
  Administered 2021-09-29 – 2021-10-04 (×5): 6 via TOPICAL

## 2021-09-28 MED ORDER — ALTEPLASE 2 MG IJ SOLR
2.0000 mg | Freq: Once | INTRAMUSCULAR | Status: DC
  Filled 2021-09-28: qty 2

## 2021-09-28 MED ORDER — SODIUM CHLORIDE 0.9% FLUSH: 10.0000 mL | INTRAVENOUS | Status: DC | PRN

## 2021-09-28 MED ORDER — ONDANSETRON HCL 4 MG/2ML IJ SOLN
4.0000 mg | Freq: Once | INTRAMUSCULAR | Status: DC
Start: 2021-09-28 — End: 2021-09-28

## 2021-09-28 MED ORDER — SODIUM CHLORIDE 0.9 % IV SOLN
2.0000 g | Freq: Three times a day (TID) | INTRAVENOUS | Status: DC
  Administered 2021-09-29 – 2021-10-01 (×7): 2 g via INTRAVENOUS
  Filled 2021-09-28 (×8): qty 2

## 2021-09-28 MED ORDER — VANCOMYCIN HCL IN DEXTROSE 1-5 GM/200ML-% IV SOLN
1000.0000 mg | Freq: Once | INTRAVENOUS | Status: AC
Start: 2021-09-28 — End: 2021-09-29
  Administered 2021-09-28: 1000 mg via INTRAVENOUS
  Filled 2021-09-28: qty 200

## 2021-09-28 MED ORDER — SERTRALINE HCL 50 MG PO TABS
50.0000 mg | ORAL_TABLET | Freq: Every morning | ORAL | Status: DC
Start: 2021-09-29 — End: 2021-10-04
  Administered 2021-09-29 – 2021-10-04 (×6): 50 mg via ORAL
  Filled 2021-09-28 (×6): qty 1

## 2021-09-28 MED ORDER — IBUPROFEN 800 MG PO TABS
800.0000 mg | ORAL_TABLET | Freq: Three times a day (TID) | ORAL | Status: DC | PRN
  Administered 2021-09-29 – 2021-10-04 (×11): 800 mg via ORAL
  Filled 2021-09-28 (×12): qty 1

## 2021-09-28 NOTE — Assessment & Plan Note (Signed)
S/p radical mastectomy and chemoradiation currently on active therapy with Caitlyn Hendricks.  Follows with oncology, Dr. [name]. ?

## 2021-09-28 NOTE — Assessment & Plan Note (Signed)
Suspect secondary to [place].  Follow GI pathogen and C. difficile panels.  Continue IV fluid hydration advance diet as tolerated. ?

## 2021-09-28 NOTE — ED Triage Notes (Signed)
Pt arrived via EMS, from home, c/o nausea, vomiting since this morning. On going diarrhea from chemo tx.  ? ?Port accessed by EMS ?'8mg'$  zofran given  ?

## 2021-09-28 NOTE — ED Provider Notes (Signed)
?[facility] DEPT ?Provider Note ? ? ?CSN: [id number] ?Arrival date & time: 09/28/21  1457 ? ?  ? ?History ? ?Chief Complaint  ?Patient presents with  ? Emesis  ? ? ?Caitlyn Hendricks is a 41 y.o. female hx of breast cancer on chemo, here with vomiting, diarrhea.  Patient states that she received her chemotherapy on Monday and Neulasta on Tuesday.  She states that a week ago she was seen in the ED and was diagnosed with pneumonia and just finished her antibiotics today.  She states that for the whole week, she has been having watery diarrhea.  She states about 15 episodes a day.  Patient denies any history of C. difficile in the past.  She states that she has abdominal cramps but no abdominal pain.  She also has low-grade temperature at home ? ?The history is provided by the patient.  ? ?  ? ?Home Medications ?Prior to Admission medications   ?Medication Sig Start Date End Date Taking? Authorizing Provider  ?ALPRAZolam (XANAX) 1 MG tablet Take 1 mg by mouth 3 (three) times daily as needed for anxiety.     [provider]  ?amoxicillin-clavulanate (AUGMENTIN) 875-125 MG tablet Take 1 tablet by mouth every 12 (twelve) hours. 09/13/21   Evlyn Courier, PA-C  ?baclofen (LIORESAL) 10 MG tablet TAKE 1 TABLET BY MOUTH TWICE A DAY AS NEEDED FOR MUSCLE SPASMS 09/24/21   Truitt Merle, MD  ?citalopram (CELEXA) 20 MG tablet Take 20 mg by mouth daily.    [provider]  ?dexamethasone (DECADRON) 4 MG tablet Take 2 tablets (8 mg) daily for 3 days after chemotherapy. Take with food. 09/17/21   Truitt Merle, MD  ?diphenhydrAMINE (BENADRYL) 25 mg capsule Take 25 mg by mouth every 6 (six) hours as needed for allergies. ?Patient not taking: Reported on 06/13/2021    [provider]  ?diphenhydrAMINE HCl (ZZZQUIL) 50 MG/30ML LIQD Take 30 mLs by mouth at bedtime as needed (sleep). ?Patient not taking: Reported on 06/13/2021    [provider]  ?diphenoxylate-atropine (LOMOTIL) 2.5-0.025 MG  tablet Take 1-2 tablets by mouth 4 (four) times daily as needed for diarrhea or loose stools. 09/28/21   Truitt Merle, MD  ?fluconazole (DIFLUCAN) 100 MG tablet Take 1 tablet (100 mg total) by mouth daily. 09/24/21   Truitt Merle, MD  ?hydroxychloroquine (PLAQUENIL) 200 MG tablet Take 200 mg by mouth 2 (two) times daily. 05/13/21   [provider]  ?ibuprofen (ADVIL) 200 MG tablet Take 800 mg by mouth every 8 (eight) hours as needed for moderate pain.    [provider]  ?lidocaine-prilocaine (EMLA) cream APPLY TO AFFECTED AREA ONCE AS NEEDED (PORT ACCESS) 08/22/21   Truitt Merle, MD  ?lidocaine-prilocaine (EMLA) cream Apply to affected area once 09/17/21   Truitt Merle, MD  ?sertraline (ZOLOFT) 50 MG tablet Take 50 mg by mouth every morning. 09/11/21   [provider]  ?prochlorperazine (COMPAZINE) 10 MG tablet Take 1 tablet (10 mg total) by mouth every 6 (six) hours as needed (Nausea or vomiting). 12/28/20 01/13/21  Truitt Merle, MD  ?   ? ?Allergies    ?Medroxyprogesterone and Xeloda [capecitabine]   ? ?Review of Systems   ?Review of Systems  ?Gastrointestinal:  Positive for diarrhea and vomiting.  ?All other systems reviewed and are negative. ? ?Physical Exam ?Updated Vital Signs ?BP 113/76 (BP Location: Right Wrist)   Pulse (!) 110   Temp (!) 100.7 ?F (38.2 ?C) (Oral)   Resp 18  SpO2 98%  ?Physical Exam ?Vitals and nursing note reviewed.  ?Constitutional:   ?   Comments: Dehydrated  ?HENT:  ?   Head: Normocephalic.  ?   Nose: Nose normal.  ?   Mouth/Throat:  ?   Mouth: Mucous membranes are dry.  ?Eyes:  ?   Extraocular Movements: Extraocular movements intact.  ?   Pupils: Pupils are equal, round, and reactive to light.  ?Cardiovascular:  ?   Rate and Rhythm: Normal rate and regular rhythm.  ?   Pulses: Normal pulses.  ?   Heart sounds: Normal heart sounds.  ?Pulmonary:  ?   Effort: Pulmonary effort is normal.  ?   Breath sounds: Normal breath sounds.  ?Abdominal:  ?   General: Abdomen is flat.  ?    Palpations: Abdomen is soft.  ?Musculoskeletal:     ?   General: Normal range of motion.  ?   Cervical back: Normal range of motion and neck supple.  ?Skin: ?   General: Skin is warm.  ?   Capillary Refill: Capillary refill takes less than 2 seconds.  ?Neurological:  ?   General: No focal deficit present.  ?   Mental Status: She is oriented to person, place, and time.  ?Psychiatric:     ?   Mood and Affect: Mood normal.     ?   Behavior: Behavior normal.  ? ? ?ED Results / Procedures / Treatments   ?Labs ?(all labs ordered are listed, but only abnormal results are displayed) ?Labs Reviewed  ?CBC WITH DIFFERENTIAL/PLATELET - Abnormal; Notable for the following components:  ?    Result Value  ? WBC 0.3 (*)   ? RBC 3.81 (*)   ? Hemoglobin 11.2 (*)   ? HCT 34.1 (*)   ? Neutro Abs 0.0 (*)   ? Lymphs Abs 0.2 (*)   ? Monocytes Absolute 0.0 (*)   ? All other components within normal limits  ?COMPREHENSIVE METABOLIC PANEL - Abnormal; Notable for the following components:  ? Sodium 134 (*)   ? Glucose, Bld 119 (*)   ? Calcium 8.6 (*)   ? Albumin 3.4 (*)   ? All other components within normal limits  ?CULTURE, BLOOD (ROUTINE X 2)  ?CULTURE, BLOOD (ROUTINE X 2)  ?RESP PANEL BY RT-PCR (FLU A&B, COVID) ARPGX2  ?LIPASE, BLOOD  ?URINALYSIS, ROUTINE W REFLEX MICROSCOPIC  ?LACTIC ACID, PLASMA  ?LACTIC ACID, PLASMA  ?I-STAT BETA HCG BLOOD, ED ([facility], [facility], [facility] ONLY)  ? ? ?EKG ?None ? ?Radiology ?DG ABD ACUTE 2+V W 1V CHEST ? ?Result Date: 09/28/2021 ?CLINICAL DATA:  Vomiting, metastatic breast cancer EXAM: DG ABDOMEN ACUTE WITH 1 VIEW CHEST COMPARISON:  06/15/2021, 09/13/2021 FINDINGS: Supine and upright frontal views of the abdomen as well as an upright frontal view of the chest are obtained. Persistent patchy left upper lobe airspace disease not significantly changed since prior exam. No new areas of consolidation, effusion, or pneumothorax. There is a relative paucity of bowel gas. No evidence of high-grade obstruction or ileus. No masses  or abnormal calcifications. No free gas in the greater peritoneal sac. There are no acute or destructive bony lesions. Postsurgical changes from left mastectomy and left axillary lymph node dissection. Stable right chest wall port. IMPRESSION: 1. Stable left upper lobe airspace disease compatible with inflammation or infection. 2. Paucity of bowel gas, with no evidence of high-grade obstruction or ileus. Electronically Signed   By: Randa Ngo M.D.   On: 09/28/2021 17:14   ? ?  Procedures ?Procedures  ? ? ?CRITICAL CARE ?Performed by: Wandra Arthurs ? ? ?Total critical care time: 30 minutes ? ?Critical care time was exclusive of separately billable procedures and treating other patients. ? ?Critical care was necessary to treat or prevent imminent or life-threatening deterioration. ? ?Critical care was time spent personally by me on the following activities: development of treatment plan with patient and/or surrogate as well as nursing, discussions with consultants, evaluation of patient's response to treatment, examination of patient, obtaining history from patient or surrogate, ordering and performing treatments and interventions, ordering and review of laboratory studies, ordering and review of radiographic studies, pulse oximetry and re-evaluation of patient's condition. ? ? ?Medications Ordered in ED ?Medications  ?alteplase (CATHFLO ACTIVASE) injection 2 mg (has no administration in time range)  ?acetaminophen (TYLENOL) tablet 650 mg (has no administration in time range)  ?vancomycin (VANCOCIN) IVPB 1000 mg/200 mL premix (has no administration in time range)  ?ceFEPIme (MAXIPIME) 2 g in sodium chloride 0.9 % 100 mL IVPB (has no administration in time range)  ?LORazepam (ATIVAN) injection 1 mg (1 mg Intravenous Given 09/28/21 1612)  ?sodium chloride 0.9 % bolus 1,000 mL (1,000 mLs Intravenous New Bag/Given 09/28/21 1629)  ? ? ?ED Course/ Medical Decision Making/ A&P ?  ?                        ?Medical Decision  Making ?Caitlyn Hendricks is a 41 y.o. female here with vomiting and diarrhea.  Consider viral gastroenteritis.  Patient also recently finished a course of antibiotics so consider C. difficile as well.  She also received

## 2021-09-28 NOTE — Assessment & Plan Note (Signed)
Hold home Plaquenil for now with severe leukopenia. ?

## 2021-09-28 NOTE — Hospital Course (Signed)
Caitlyn Hendricks is a 41 y.o. female with medical history significant for stage IIIb left breast cancer (s/p radical mastectomy, chemoradiation) on active therapy with Trodelvy, cutaneous lupus, depression/anxiety/PTSD who is admitted with febrile neutropenia and nausea/vomiting/diarrhea. ?

## 2021-09-28 NOTE — Assessment & Plan Note (Signed)
No obvious infectious source at time of admission, possibly infectious gastroenteritis although GI symptoms could be related to [place].  WBC 0.3 with ANC 0.0 on admission, suspect also secondary to [place]. ?-Continue empiric IV cefepime ?-Follow blood cultures, urinalysis ?-SARS-CoV-2 and influenza PCR negative ?-GI pathogen and C. difficile panels pending collection ?

## 2021-09-28 NOTE — Progress Notes (Signed)
A consult was received from an ED physician for Cefepime per pharmacy dosing.  The patient's profile has been reviewed for ht/wt/allergies/indication/available labs.   ? ?A one time order has been placed for Cefepime 2g IV.  Further antibiotics/pharmacy consults should be ordered by admitting physician if indicated.       ?                ?Thank you, ?Luiz Ochoa ?09/28/2021  6:14 PM ? ?

## 2021-09-28 NOTE — Assessment & Plan Note (Signed)
Continue home Zoloft and Xanax 1 mg 3 times daily as needed. ?

## 2021-09-28 NOTE — ED Notes (Signed)
IV team at bedside. Port reaccessed, able to pull blood at this time. Flushed without issue.  ?Blood collected from port and additional bloodwork obtained from additional sites. IV in [facility] removed at this time. IV abx and COVID swab sent.  ?

## 2021-09-28 NOTE — H&P (Signed)
?History and Physical  ? ? ?Caitlyn Hendricks NMM:[id number] DOB: 24-Feb-1981 DOA: 09/28/2021 ? ?PCP: [name], MD  ?Patient coming from: Home via EMS ? ?I have personally briefly reviewed patient's old medical records in [place] ? ?Chief Complaint: Nausea, vomiting, diarrhea ? ?HPI: ?Caitlyn Hendricks is a 41 y.o. female with medical history significant for stage IIIb left breast cancer (s/p radical mastectomy, chemoradiation) on active therapy with Trodelvy, cutaneous lupus, depression/anxiety/PTSD who presented to the ED for evaluation of nausea, vomiting, diarrhea. ? ?Patient recently started therapy with Trodelvy on 3/13.  She says since then she has been having frequent watery diarrhea.  She did have last infusion on 3/20.  She says that she has been unable to maintain adequate oral intake due to food and liquids running right through her.  She has not seen any bloody or dark black stools.  She has had occasional lightheadedness/dizziness without syncope or fall.  Today she developed nausea with an episode of emesis.  She also has new subjective fevers, chills, diaphoresis.  She has not had any significant abdominal pain.  She denies any dysuria.  She denies any skin changes. ? ?Patient was seen in the ED earlier this month on 09/13/2021 for left-sided chest discomfort. CTA chest was negative for PE but did show consolidative opacities with surrounding groundglass in multiple locations in the left lung.  She was started on a course of oral Augmentin.  She was seen by oncology the following day who felt changes were most likely metastatic disease and less likely infection.  She did complete course of Augmentin. ? ?ED Course  Labs/Imaging on admission: I have personally reviewed following labs and imaging studies. ? ?Initial vitals showed BP 117/66, pulse 99, RR 14, temp 98.2 ?F, SPO2 100% on room air.  Tmax 100.7 ?F while in the ED. ? ?Labs show WBC 0.3, ANC 0.0, hemoglobin 11.2, platelets 226,000, sodium  134, potassium 3.5, bicarb 25, BUN 14, creatinine 0.77, serum glucose 119, lipase 31, i-STAT beta-hCG <5.0, lactic acid 2.4. ? ?Blood cultures in process.  Respiratory panel in process.  C. difficile and GI pathogen panels pending collection. ? ?Chest and abdominal x-ray showed stable left upper lobe airspace disease and paucity of bowel gas without evidence of high-grade obstruction or ileus. ? ?Patient was given 1 L normal saline, IV Ativan 1 mg, IV vancomycin and cefepime.  The hospitalist service was consulted to admit for further evaluation and management. ? ?Review of Systems: All systems reviewed and are negative except as documented in history of present illness above. ? ? ?Past Medical History:  ?Diagnosis Date  ? Anemia   ? Anxiety   ? Arthritis   ? Cancer [facility])   ? Left Breast  ? Depression   ? GERD (gastroesophageal reflux disease)   ? Headache   ? History of radiation therapy 10/30/20-12/11/20  ? left chest wall and axilla - Dr. Gery Pray  ? Panic attack   ? ? ?Past Surgical History:  ?Procedure Laterality Date  ? MASTECTOMY MODIFIED RADICAL Left 09/20/2020  ? Procedure: LEFT MODIFIED RADICAL MASTECTOMY;  Surgeon: [name], MD;  Location: [place];  Service: General;  Laterality: Left;  RNFA; PEC BLOCK;  ? PORTACATH PLACEMENT Right 03/21/2020  ? Procedure: INSERTION PORT-A-CATH WITH ULTRASOUND GUIDANCE;  Surgeon: [name], MD;  Location: [place];  Service: General;  Laterality: Right;  ? ? ?Social History: ? reports that she has never smoked. She has quit using smokeless  tobacco. She reports that she does not currently use alcohol. She reports current drug use. Frequency: 7.00 times per week. Drug: Marijuana. ? ?Allergies  ?Allergen Reactions  ? Medroxyprogesterone Other (See Comments)  ?  Heavy Bleeding ?Heavy Bleeding ?  ? Xeloda [Capecitabine] Rash  ? ? ?Family History  ?Problem Relation Age of Onset  ? Cancer Father   ?     prostate cancer   ? Cancer Maternal  Grandmother 53  ?     breast cancer  ? ? ? ?Prior to Admission medications   ?Medication Sig Start Date End Date Taking? Authorizing Provider  ?ALPRAZolam (XANAX) 1 MG tablet Take 1 mg by mouth 3 (three) times daily as needed for anxiety.     [provider]  ?amoxicillin-clavulanate (AUGMENTIN) 875-125 MG tablet Take 1 tablet by mouth every 12 (twelve) hours. 09/13/21   Evlyn Courier, PA-C  ?baclofen (LIORESAL) 10 MG tablet TAKE 1 TABLET BY MOUTH TWICE A DAY AS NEEDED FOR MUSCLE SPASMS ?Patient taking differently: Take 10 mg by mouth 2 (two) times daily as needed for muscle spasms. 09/24/21   Truitt Merle, MD  ?citalopram (CELEXA) 20 MG tablet Take 20 mg by mouth daily.    [provider]  ?dexamethasone (DECADRON) 4 MG tablet Take 2 tablets (8 mg) daily for 3 days after chemotherapy. Take with food. 09/17/21   Truitt Merle, MD  ?diphenhydrAMINE (BENADRYL) 25 mg capsule Take 25 mg by mouth every 6 (six) hours as needed for allergies. ?Patient not taking: Reported on 06/13/2021    [provider]  ?diphenhydrAMINE HCl (ZZZQUIL) 50 MG/30ML LIQD Take 30 mLs by mouth at bedtime as needed (sleep). ?Patient not taking: Reported on 06/13/2021    [provider]  ?diphenoxylate-atropine (LOMOTIL) 2.5-0.025 MG tablet Take 1-2 tablets by mouth 4 (four) times daily as needed for diarrhea or loose stools. 09/28/21   Truitt Merle, MD  ?fluconazole (DIFLUCAN) 100 MG tablet Take 1 tablet (100 mg total) by mouth daily. 09/24/21   Truitt Merle, MD  ?hydroxychloroquine (PLAQUENIL) 200 MG tablet Take 200 mg by mouth 2 (two) times daily. 05/13/21   [provider]  ?ibuprofen (ADVIL) 200 MG tablet Take 800 mg by mouth every 8 (eight) hours as needed for moderate pain.    [provider]  ?lidocaine-prilocaine (EMLA) cream APPLY TO AFFECTED AREA ONCE AS NEEDED (PORT ACCESS) ?Patient taking differently: Apply 1 application. topically as needed (port access). 08/22/21   Truitt Merle, MD  ?sertraline (ZOLOFT) 50  MG tablet Take 50 mg by mouth every morning. 09/11/21   [provider]  ?prochlorperazine (COMPAZINE) 10 MG tablet Take 1 tablet (10 mg total) by mouth every 6 (six) hours as needed (Nausea or vomiting). 12/28/20 01/13/21  Truitt Merle, MD  ? ? ?Physical Exam: ?Vitals:  ? 09/28/21 1930 09/28/21 2000 09/28/21 2054 09/28/21 2155  ?BP: 117/69 (!) 114/58 109/63   ?Pulse: (!) 114 (!) 117 (!) 113   ?Resp:  18 18   ?Temp:  100 ?F (37.8 ?C) 98.9 ?F (37.2 ?C) 99.5 ?F (37.5 ?C)  ?TempSrc:   Oral Oral  ?SpO2: 98% 98% 97%   ? ?Constitutional: Resting in bed with head elevated, NAD, calm, comfortable ?Eyes: PERRL, lids and conjunctivae normal ?ENMT: Mucous membranes are moist. Posterior pharynx clear of any exudate or lesions.Normal dentition.  ?Neck: normal, supple, no masses. ?Respiratory: clear to auscultation bilaterally, no wheezing, no crackles. Normal respiratory effort. No accessory muscle use.  ?Cardiovascular: Regular rate and rhythm, no murmurs /  rubs / gallops. No extremity edema. 2+ pedal pulses.  Port-A-Cath in place right chest wall. ?Abdomen: no tenderness, no masses palpated. No hepatosplenomegaly.  ?Musculoskeletal: no clubbing / cyanosis. No joint deformity upper and lower extremities. Good ROM, no contractures. Normal muscle tone.  ?Skin: no rashes, lesions, ulcers. No induration ?Neurologic: CN 2-12 grossly intact. Sensation intact. Strength 5/5 in all 4.  ?Psychiatric: Normal judgment and insight. Alert and oriented x 3. Normal mood.  ? ?EKG: Not performed. ? ?Assessment/Plan ?Principal Problem: ?  Febrile neutropenia ([place]) ?Active Problems: ?  Nausea, vomiting, and diarrhea ?  Cancer of central portion of left female breast ([place]) ?  Depression with anxiety ?  Cutaneous lupus erythematosus ?  ?Caitlyn Hendricks is a 41 y.o. female with medical history significant for stage IIIb left breast cancer (s/p radical mastectomy, chemoradiation) on active therapy with Trodelvy, cutaneous lupus, depression/anxiety/PTSD  who is admitted with febrile neutropenia and nausea/vomiting/diarrhea. ? ?Assessment and Plan: ?* Febrile neutropenia ([place]) ?No obvious infectious source at time of admission, possibly infectious gastroente

## 2021-09-28 NOTE — ED Notes (Signed)
Lactic 2.4 reported to D. Darl Householder ?

## 2021-09-29 DIAGNOSIS — Z9012 Acquired absence of left breast and nipple: Secondary | ICD-10-CM | POA: Diagnosis not present

## 2021-09-29 DIAGNOSIS — C50112 Malignant neoplasm of central portion of left female breast: Secondary | ICD-10-CM

## 2021-09-29 DIAGNOSIS — L932 Other local lupus erythematosus: Secondary | ICD-10-CM | POA: Diagnosis not present

## 2021-09-29 DIAGNOSIS — C773 Secondary and unspecified malignant neoplasm of axilla and upper limb lymph nodes: Secondary | ICD-10-CM | POA: Diagnosis present

## 2021-09-29 DIAGNOSIS — D638 Anemia in other chronic diseases classified elsewhere: Secondary | ICD-10-CM | POA: Diagnosis present

## 2021-09-29 DIAGNOSIS — L930 Discoid lupus erythematosus: Secondary | ICD-10-CM | POA: Diagnosis present

## 2021-09-29 DIAGNOSIS — E669 Obesity, unspecified: Secondary | ICD-10-CM | POA: Diagnosis present

## 2021-09-29 DIAGNOSIS — F418 Other specified anxiety disorders: Secondary | ICD-10-CM | POA: Diagnosis not present

## 2021-09-29 DIAGNOSIS — R112 Nausea with vomiting, unspecified: Secondary | ICD-10-CM

## 2021-09-29 DIAGNOSIS — E872 Acidosis, unspecified: Secondary | ICD-10-CM | POA: Diagnosis present

## 2021-09-29 DIAGNOSIS — D709 Neutropenia, unspecified: Secondary | ICD-10-CM | POA: Diagnosis not present

## 2021-09-29 DIAGNOSIS — F4310 Post-traumatic stress disorder, unspecified: Secondary | ICD-10-CM | POA: Diagnosis present

## 2021-09-29 DIAGNOSIS — T451X5A Adverse effect of antineoplastic and immunosuppressive drugs, initial encounter: Secondary | ICD-10-CM | POA: Diagnosis present

## 2021-09-29 DIAGNOSIS — Z87891 Personal history of nicotine dependence: Secondary | ICD-10-CM | POA: Diagnosis not present

## 2021-09-29 DIAGNOSIS — E860 Dehydration: Secondary | ICD-10-CM | POA: Diagnosis present

## 2021-09-29 DIAGNOSIS — Z923 Personal history of irradiation: Secondary | ICD-10-CM | POA: Diagnosis not present

## 2021-09-29 DIAGNOSIS — R509 Fever, unspecified: Secondary | ICD-10-CM | POA: Diagnosis present

## 2021-09-29 DIAGNOSIS — R197 Diarrhea, unspecified: Secondary | ICD-10-CM

## 2021-09-29 DIAGNOSIS — Z79899 Other long term (current) drug therapy: Secondary | ICD-10-CM | POA: Diagnosis not present

## 2021-09-29 DIAGNOSIS — R5081 Fever presenting with conditions classified elsewhere: Secondary | ICD-10-CM | POA: Diagnosis present

## 2021-09-29 DIAGNOSIS — Z171 Estrogen receptor negative status [ER-]: Secondary | ICD-10-CM

## 2021-09-29 DIAGNOSIS — E871 Hypo-osmolality and hyponatremia: Secondary | ICD-10-CM | POA: Diagnosis present

## 2021-09-29 DIAGNOSIS — D701 Agranulocytosis secondary to cancer chemotherapy: Secondary | ICD-10-CM | POA: Diagnosis present

## 2021-09-29 DIAGNOSIS — F32A Depression, unspecified: Secondary | ICD-10-CM | POA: Diagnosis present

## 2021-09-29 DIAGNOSIS — F410 Panic disorder [episodic paroxysmal anxiety] without agoraphobia: Secondary | ICD-10-CM | POA: Diagnosis present

## 2021-09-29 DIAGNOSIS — Z20822 Contact with and (suspected) exposure to covid-19: Secondary | ICD-10-CM | POA: Diagnosis present

## 2021-09-29 DIAGNOSIS — A0472 Enterocolitis due to Clostridium difficile, not specified as recurrent: Secondary | ICD-10-CM | POA: Diagnosis present

## 2021-09-29 DIAGNOSIS — Z803 Family history of malignant neoplasm of breast: Secondary | ICD-10-CM | POA: Diagnosis not present

## 2021-09-29 DIAGNOSIS — K219 Gastro-esophageal reflux disease without esophagitis: Secondary | ICD-10-CM | POA: Diagnosis present

## 2021-09-29 DIAGNOSIS — D849 Immunodeficiency, unspecified: Secondary | ICD-10-CM | POA: Diagnosis present

## 2021-09-29 LAB — CBC WITH DIFFERENTIAL/PLATELET
Abs Immature Granulocytes: 0 10*3/uL (ref 0.00–0.07)
Basophils Absolute: 0 10*3/uL (ref 0.0–0.1)
Basophils Relative: 0 %
Eosinophils Absolute: 0 10*3/uL (ref 0.0–0.5)
Eosinophils Relative: 4 %
HCT: 30.2 % — ABNORMAL LOW (ref 36.0–46.0)
Hemoglobin: 10.2 g/dL — ABNORMAL LOW (ref 12.0–15.0)
Lymphocytes Relative: 80 %
Lymphs Abs: 0.2 10*3/uL — ABNORMAL LOW (ref 0.7–4.0)
MCH: 29.8 pg (ref 26.0–34.0)
MCHC: 33.8 g/dL (ref 30.0–36.0)
MCV: 88.3 fL (ref 80.0–100.0)
Monocytes Absolute: 0 10*3/uL — ABNORMAL LOW (ref 0.1–1.0)
Monocytes Relative: 4 %
Neutro Abs: 0 10*3/uL — CL (ref 1.7–7.7)
Neutrophils Relative %: 12 %
Platelets: 175 10*3/uL (ref 150–400)
RBC: 3.42 MIL/uL — ABNORMAL LOW (ref 3.87–5.11)
RDW: 14.8 % (ref 11.5–15.5)
WBC: 0.2 10*3/uL — CL (ref 4.0–10.5)
nRBC: 0 % (ref 0.0–0.2)

## 2021-09-29 LAB — BASIC METABOLIC PANEL
Anion gap: 5 (ref 5–15)
BUN: 8 mg/dL (ref 6–20)
CO2: 24 mmol/L (ref 22–32)
Calcium: 8.1 mg/dL — ABNORMAL LOW (ref 8.9–10.3)
Chloride: 102 mmol/L (ref 98–111)
Creatinine, Ser: 0.78 mg/dL (ref 0.44–1.00)
GFR, Estimated: >60 mL/min (ref >60)
Glucose, Bld: 123 mg/dL — ABNORMAL HIGH (ref 70–99)
Potassium: 3.3 mmol/L — ABNORMAL LOW (ref 3.5–5.1)
Sodium: 131 mmol/L — ABNORMAL LOW (ref 135–145)

## 2021-09-29 LAB — URINALYSIS, ROUTINE W REFLEX MICROSCOPIC
Bilirubin Urine: NEGATIVE
Glucose, UA: NEGATIVE mg/dL
Ketones, ur: NEGATIVE mg/dL
Leukocytes,Ua: NEGATIVE
Nitrite: NEGATIVE
Protein, ur: NEGATIVE mg/dL
Specific Gravity, Urine: 1.02 (ref 1.005–1.030)
pH: 7.5 (ref 5.0–8.0)

## 2021-09-29 LAB — URINALYSIS, MICROSCOPIC (REFLEX)

## 2021-09-29 LAB — HIV ANTIBODY (ROUTINE TESTING W REFLEX): HIV Screen 4th Generation wRfx: NONREACTIVE

## 2021-09-29 LAB — CLOSTRIDIUM DIFFICILE BY PCR, REFLEXED: Toxigenic C. Difficile by PCR: POSITIVE — AB

## 2021-09-29 LAB — C DIFFICILE QUICK SCREEN W PCR REFLEX
C Diff antigen: POSITIVE — AB
C Diff toxin: NEGATIVE

## 2021-09-29 LAB — MAGNESIUM: Magnesium: 1.3 mg/dL — ABNORMAL LOW (ref 1.7–2.4)

## 2021-09-29 MED ORDER — OLANZAPINE 5 MG PO TABS
5.0000 mg | ORAL_TABLET | Freq: Every day | ORAL | Status: DC
  Administered 2021-09-29 – 2021-10-03 (×5): 5 mg via ORAL
  Filled 2021-09-29 (×5): qty 1

## 2021-09-29 MED ORDER — VANCOMYCIN HCL IN DEXTROSE 1-5 GM/200ML-% IV SOLN
1000.0000 mg | Freq: Two times a day (BID) | INTRAVENOUS | Status: DC
  Administered 2021-09-29 (×2): 1000 mg via INTRAVENOUS
  Filled 2021-09-29: qty 200

## 2021-09-29 MED ORDER — POTASSIUM CHLORIDE CRYS ER 20 MEQ PO TBCR
40.0000 meq | EXTENDED_RELEASE_TABLET | ORAL | Status: AC
  Administered 2021-09-29 (×2): 40 meq via ORAL
  Filled 2021-09-29 (×2): qty 2

## 2021-09-29 MED ORDER — PROCHLORPERAZINE EDISYLATE 10 MG/2ML IJ SOLN
10.0000 mg | Freq: Four times a day (QID) | INTRAMUSCULAR | Status: DC | PRN
  Administered 2021-09-29 – 2021-10-01 (×8): 10 mg via INTRAVENOUS
  Filled 2021-09-29 (×8): qty 2

## 2021-09-29 MED ORDER — SODIUM CHLORIDE 0.9 % IV SOLN: INTRAVENOUS | Status: AC

## 2021-09-29 MED ORDER — MAGNESIUM SULFATE 2 GM/50ML IV SOLN
2.0000 g | Freq: Once | INTRAVENOUS | Status: AC
  Administered 2021-09-29: 2 g via INTRAVENOUS
  Filled 2021-09-29: qty 50

## 2021-09-29 NOTE — Consult Note (Signed)
? ?[place]  ?[phone] [phone]) [phone]  ? ?MEDICAL ONCOLOGY - INITIAL CONSULTATION ? ? ? ?Referral MD ? ?Reason for Referral:  ? ?Neutropenic fever ? ?Chief Complaint  ?Patient presents with  ? Emesis  ? ? ?HPI: ? 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 ?Patient recently started therapy with Trodelvy on 3/13. She has been having diarrhea for the past 2 weeks. Nausea hasn't been well controlled. She started having fevers since she came to the hospital. She received Udencya for growth factor support. ?She denies any sore throat. She says she feels terrible today, very nauseated. She was getting compazine through IV, this has worked for her in the past. No abdominal pain. No dysuria. No cough, chest pain or SOB. ?Rest of the pertinent 10 point ROS reviewed and neg. ? ?Past Medical History:  ?Diagnosis Date  ? Anemia   ? Anxiety   ? Arthritis   ? Cancer [facility])   ? Left Breast  ? Depression   ? GERD (gastroesophageal reflux disease)   ? Headache   ? History of radiation therapy 10/30/20-12/11/20  ? left chest wall and axilla - Dr. Gery Pray  ? Panic attack   ?: ? ? ?Past Surgical History:  ?Procedure Laterality Date  ? MASTECTOMY MODIFIED RADICAL Left 09/20/2020  ? Procedure: LEFT MODIFIED RADICAL MASTECTOMY;  Surgeon: [name], MD;  Location: [place];  Service: General;  Laterality: Left;  RNFA; PEC BLOCK;  ? PORTACATH PLACEMENT Right 03/21/2020  ? Procedure: INSERTION PORT-A-CATH WITH ULTRASOUND GUIDANCE;  Surgeon: [name], MD;  Location: [place];  Service: General;  Laterality: Right;  ?: ? ? ?Current Facility-Administered Medications  ?Medication Dose Route Frequency Provider Last Rate Last Admin  ? 0.9 %  sodium chloride infusion   Intravenous Continuous Aline August, MD 100 mL/hr at  09/29/21 1043 New Bag at 09/29/21 1043  ? acetaminophen (TYLENOL) tablet 650 mg  650 mg Oral Q6H PRN Lenore Cordia, MD   650 mg at 09/29/21 0536  ? Or  ? acetaminophen (TYLENOL) suppository 650 mg  650 mg Rectal Q6H PRN Lenore Cordia, MD      ? ALPRAZolam Duanne Moron) tablet 1 mg  1 mg Oral TID PRN Lenore Cordia, MD   1 mg at 09/29/21 0524  ? alteplase (CATHFLO ACTIVASE) injection 2 mg  2 mg Intracatheter Once Drenda Freeze, MD      ? baclofen (LIORESAL) tablet 10 mg  10 mg Oral BID PRN Lenore Cordia, MD      ? ceFEPIme (MAXIPIME) 2 g in sodium chloride 0.9 % 100 mL IVPB  2 g Intravenous Q8H Zada Finders R, MD 200 mL/hr at 09/29/21 0519 Infusion Verify at 09/29/21 0519  ? Chlorhexidine Gluconate Cloth 2 % PADS 6 each  6 each Topical Daily Drenda Freeze, MD      ? enoxaparin (LOVENOX) injection 40 mg  40 mg Subcutaneous Q24H Zada Finders R, MD   40 mg at 09/28/21 2153  ? ibuprofen (ADVIL) tablet 800 mg  800 mg Oral Q8H PRN Lenore Cordia, MD   800 mg at 09/29/21 1044  ? OLANZapine (ZYPREXA) tablet 5 mg  5 mg Oral QHS Bernarr Longsworth, MD      ? ondansetron (ZOFRAN) tablet 4 mg  4 mg Oral Q6H PRN Posey Pronto,  Vishal R, MD      ? Or  ? ondansetron (ZOFRAN) injection 4 mg  4 mg Intravenous Q6H PRN Lenore Cordia, MD   4 mg at 09/29/21 3428  ? potassium chloride SA (KLOR-CON M) CR tablet 40 mEq  40 mEq Oral Q4H Aline August, MD   40 mEq at 09/29/21 0824  ? prochlorperazine (COMPAZINE) injection 10 mg  10 mg Intravenous Q6H PRN Aline August, MD   10 mg at 09/29/21 1058  ? sertraline (ZOLOFT) tablet 50 mg  50 mg Oral q morning Lenore Cordia, MD   50 mg at 09/29/21 1044  ? sodium chloride flush (NS) 0.9 % injection 10-40 mL  10-40 mL Intracatheter Q12H Drenda Freeze, MD   10 mL at 09/29/21 1045  ? sodium chloride flush (NS) 0.9 % injection 10-40 mL  10-40 mL Intracatheter PRN Drenda Freeze, MD      ? vancomycin (VANCOCIN) IVPB 1000 mg/200 mL premix  1,000 mg Intravenous Q12H Emiliano Dyer, RPH  200 mL/hr at 09/29/21 1110 1,000 mg at 09/29/21 1110  ? ?Facility-Administered Medications Ordered in Other Encounters  ?Medication Dose Route Frequency Provider Last Rate Last Admin  ? sodium chloride flush (NS) 0.9 % injection 10 mL  10 mL Intravenous PRN Alla Feeling, NP   10 mL at 03/28/20 1104  ? ? ? ? ?Allergies  ?Allergen Reactions  ? Medroxyprogesterone Other (See Comments)  ?  Heavy Bleeding ?Heavy Bleeding ?  ? Xeloda [Capecitabine] Rash  ?: ? ? ?Family History  ?Problem Relation Age of Onset  ? Cancer Father   ?     prostate cancer   ? Cancer Maternal Grandmother 9  ?     breast cancer  ?: ? ? ?Social History  ? ?Socioeconomic History  ? Marital status: Single  ?  Spouse name: Not on file  ? Number of children: 0  ? Years of education: Not on file  ? Highest education level: Not on file  ?Occupational History  ? Occupation: Air traffic controller   ?Tobacco Use  ? Smoking status: Never  ? Smokeless tobacco: Former  ?Vaping Use  ? Vaping Use: Never used  ?Substance and Sexual Activity  ? Alcohol use: Not Currently  ?  Comment: bing drink for last year, usually liquor   ? Drug use: Yes  ?  Frequency: 7.0 times per week  ?  Types: Marijuana  ?  Comment: 2 puffs a day  ? Sexual activity: Yes  ?Other Topics Concern  ? Not on file  ?Social History Narrative  ? Not on file  ? ?Social Determinants of Health  ? ?Financial Resource Strain: Medium Risk  ? Difficulty of Paying Living Expenses: Somewhat hard  ?Food Insecurity: Food Insecurity Present  ? Worried About Charity fundraiser in the Last Year: Sometimes true  ? Ran Out of Food in the Last Year: Sometimes true  ?Transportation Needs: No Transportation Needs  ? Lack of Transportation (Medical): No  ? Lack of Transportation (Non-Medical): No  ?Physical Activity: Not on file  ?Stress: Not on file  ?Social Connections: Socially Isolated  ? Frequency of Communication with Friends and Family: More than three times a week  ? Frequency of Social Gatherings with Friends and  Family: More than three times a week  ? Attends Religious Services: Never  ? Active Member of Clubs or Organizations: No  ? Attends Archivist Meetings: Never  ? Marital Status: Never married  ?  Intimate Partner Violence: Not on file  ?: ? ?Pertinent items are noted in HPI. ? ?Exam: ?Patient Vitals for the past 24 hrs: ? BP Temp Temp src Pulse Resp SpO2 Weight  ?09/29/21 1050 114/75 (!) 102 ?F (38.9 ?C) Oral (!) 121 -- 98 % --  ?09/29/21 0831 117/72 99 ?F (37.2 ?C) Oral (!) 116 18 98 % --  ?09/29/21 0744 (!) 143/81 100.3 ?F (37.9 ?C) Oral (!) 115 -- 97 % --  ?09/29/21 0646 -- 100.2 ?F (37.9 ?C) Oral -- -- -- 227 lb 11.8 oz (103.3 kg)  ?09/29/21 0627 109/68 (!) 100.5 ?F (38.1 ?C) Oral (!) 109 17 94 % --  ?09/29/21 0536 -- (!) 102.9 ?F (39.4 ?C) Oral -- -- -- --  ?09/29/21 0318 140/87 (!) 100.4 ?F (38 ?C) Oral (!) 126 16 -- --  ?09/29/21 0236 -- (!) 100.6 ?F (38.1 ?C) Oral -- -- -- --  ?09/28/21 2319 -- (!) 100.7 ?F (38.2 ?C) Oral -- 18 98 % --  ?09/28/21 2155 -- 99.5 ?F (37.5 ?C) Oral -- -- -- --  ?09/28/21 2054 109/63 98.9 ?F (37.2 ?C) Oral (!) 113 18 97 % --  ?09/28/21 2000 (!) 114/58 100 ?F (37.8 ?C) -- (!) 117 18 98 % --  ?09/28/21 1930 117/69 -- -- (!) 114 -- 98 % --  ?09/28/21 1900 118/66 -- -- (!) 113 -- 100 % --  ?09/28/21 1847 130/83 -- -- (!) 111 18 100 % --  ?09/28/21 1740 113/76 -- -- (!) 110 18 98 % --  ?09/28/21 1739 -- (!) 100.7 ?F (38.2 ?C) Oral -- -- -- --  ?09/28/21 1632 125/75 -- -- (!) 102 -- 97 % --  ?09/28/21 1600 125/75 -- -- (!) 101 15 100 % --  ?09/28/21 1530 117/66 -- -- (!) 101 14 100 % --  ?09/28/21 1517 125/80 98.2 ?F (36.8 ?C) Oral 99 16 100 % --  ? ?Physical Exam ?Constitutional:   ?   General: She is not in acute distress. ?   Appearance: Normal appearance. She is ill-appearing.  ?Musculoskeletal:     ?   General: No swelling or tenderness.  ?   Cervical back: Normal range of motion and neck supple. No rigidity.  ?Skin: ?   General: Skin is warm and dry.  ?Neurological:  ?    General: No focal deficit present.  ?   Mental Status: She is alert.  ? ?Lab Results  ?Component Value Date  ? WBC 0.2 (LL) 09/29/2021  ? HGB 10.2 (L) 09/29/2021  ? HCT 30.2 (L) 09/29/2021  ? PLT 175

## 2021-09-29 NOTE — Progress Notes (Signed)
Pharmacy Antibiotic Note ? ?Caitlyn Hendricks is a 41 y.o. female admitted on 09/28/2021 with febrile neutropenia.  Pharmacy has been consulted for vancomycin dosing. Already on cefepime. ? ?Plan: ?Vancomycin 1g IV q12h for estimated AUC 532 using SCr rounded 0.8, Vd 0.5L/kg ?Check vancomycin levels as needed, goal 400-500 ?Follow up renal function & cultures ? ?Weight: 103.3 kg (227 lb 11.8 oz) ? ?Temp (24hrs), Avg:100.1 ?F (37.8 ?C), Min:98.2 ?F (36.8 ?C), Max:102.9 ?F (39.4 ?C) ? ?Recent Labs  ?Lab 09/24/21 ?0855 09/28/21 ?1604 09/28/21 ?1825 09/28/21 ?2200 09/29/21 ?0500  ?WBC 1.0* 0.3*  --   --  0.2*  ?CREATININE 0.68 0.77  --   --  0.78  ?LATICACIDVEN  --   --  2.4* 1.5  --   ?  ?Estimated Creatinine Clearance: 111.5 mL/min (by C-G formula based on SCr of 0.78 mg/dL).   ? ?Allergies  ?Allergen Reactions  ? Medroxyprogesterone Other (See Comments)  ?  Heavy Bleeding ?Heavy Bleeding ?  ? Xeloda [Capecitabine] Rash  ? ? ?Antimicrobials this admission:  ?3/24 Cefepime >> ?3/25 Vanc >> ? ?Dose adjustments this admission:  ? ?Microbiology results:  ?3/24 BCx: ?3/25 GI panel: ?3/25 C.diff Ag: POS, Toxin: NEG, PCR: pending ? ?Thank you for allowing pharmacy to be a part of this patient?s care. ? ?Peggyann Juba, PharmD, BCPS ?Pharmacy: [phone] ?09/29/2021 10:05 AM ? ?

## 2021-09-29 NOTE — Progress Notes (Signed)
?PROGRESS NOTE ? ? ? ?Caitlyn Hendricks  KGM:[id number] DOB: 26-Nov-1980 DOA: 09/28/2021 ?PCP: [name], MD  ? ?Brief Narrative:  ?41 y.o. female with medical history significant for stage IIIb left breast cancer (s/p radical mastectomy, chemoradiation) on active therapy with [name] (most recent dose was on 09/24/2021), cutaneous lupus, depression/anxiety/PTSD who presented for evaluation of nausea, vomiting, diarrhea.  On presentation, Tmax was 100.7.  WBC 0.3, ANC 0, lactic acid 2.4.  She was started on IV fluids and broad-spectrum antibiotics.  Stool testing was ordered.  Chest and abdominal x-ray showed stable left upper airspace disease and positive bowel gas without evidence of high-grade obstruction or ileus. ? ?Assessment & Plan: ?  ?Febrile neutropenia ?-Presented with Tmax of 100.7 and WBC 0.3, ANC 0.0.  Suspect secondary to [place] ?-No source of infection.  Continue broad-spectrum antibiotics.  Follow cultures.  COVID-19/influenza testing negative on presentation stool studies pending ?-ANC 0 again today.  I have requested oncology/Dr. Hewitt Shorts to evaluate the patient today.  Patient states that she received an injection to increase her white cell count on 09/25/2021 ?-Repeat a.m. labs ? ?Nausea, vomiting, diarrhea ?-Possibly from chemotherapy.  Still nauseous with significant diarrhea.  Stool studies pending.  If negative stool studies, will put her on Imodium. ? ?Left breast cancer ?-Status post radical mastectomy and chemoradiation; currently on chemotherapy.  Follows up with oncology/Dr. [name] ?-Follow oncology recommendations ? ?Depression with anxiety ?-Continue home regimen ? ?Cutaneous lupus erythematosus ?-Home regimen (Plaquenil) on hold because of leukopenia.  Outpatient follow-up with rheumatology ? ?Anemia of chronic disease ?-From cancer.  Hemoglobin stable ? ?Hyponatremia ?-Replace.  Repeat a.m. labs ? ?Lactic acidosis ?-Resolved ? ?Hypokalemia ?-Replace.  Repeat a.m.  labs ? ?Hypomagnesemia ?-Replace.  Repeat a.m. labs ? ?Obesity ?-Outpatient follow-up ? ?DVT prophylaxis: Lovenox ?Code Status: Full ?Family Communication: None at bedside ?Disposition Plan: ?Status is: Observation ?The patient will require care spanning > 2 midnights and should be moved to inpatient because: Of severity of illness.  Need for IV antibiotics and fluids. ? ?Consultants: Consulted oncology ? ?Procedures: None ? ?Antimicrobials:  ?Cefepime and vancomycin from 09/28/2021 onwards ? ?Subjective: ?Patient seen and examined at bedside.  Still complains of severe nausea and diarrhea.  Feels weak.  Denies worsening abdominal pain.  Denies worsening shortness of breath or chest pain. ?Objective: ?Vitals:  ? 09/29/21 6440 09/29/21 3474 09/29/21 0744 09/29/21 0831  ?BP: 109/68  (!) 143/81 117/72  ?Pulse: (!) 109  (!) 115 (!) 116  ?Resp: 17   18  ?Temp: (!) 100.5 ?F (38.1 ?C) 100.2 ?F (37.9 ?C) 100.3 ?F (37.9 ?C) 99 ?F (37.2 ?C)  ?TempSrc: Oral Oral Oral Oral  ?SpO2: 94%  97% 98%  ?Weight:  103.3 kg    ? ? ?Intake/Output Summary (Last 24 hours) at 09/29/2021 0941 ?Last data filed at 09/29/2021 2595 ?Gross per 24 hour  ?Intake 1959.38 ml  ?Output --  ?Net 1959.38 ml  ? ?Filed Weights  ? 09/29/21 0646  ?Weight: 103.3 kg  ? ? ?Examination: ? ?General exam: Appears calm and comfortable.  Currently on room air.  No distress. ?Respiratory system: Bilateral decreased breath sounds at bases with some scattered crackles but ?Cardiovascular system: S1 & S2 heard, tachycardic  ?gastrointestinal system: Abdomen is obese, nondistended, soft and nontender. Normal bowel sounds heard. ?Extremities: No cyanosis, clubbing; trace lower extremity edema ?Central nervous system: Alert and oriented. No focal neurological deficits. Moving extremities ?Skin: No rashes, lesions or ulcers ?Psychiatry: Looks intermittently anxious.  Currently not agitated ? ? ? ?  Data Reviewed: I have personally reviewed following labs and imaging  studies ? ?CBC: ?Recent Labs  ?Lab 09/24/21 ?0855 09/28/21 ?1604 09/29/21 ?0500  ?WBC 1.0* 0.3* 0.2*  ?NEUTROABS 0.5* 0.0* 0.0*  ?HGB 11.4* 11.2* 10.2*  ?HCT 36.3 34.1* 30.2*  ?MCV 90.1 89.5 88.3  ?PLT 296 226 175  ? ?Basic Metabolic Panel: ?Recent Labs  ?Lab 09/24/21 ?0855 09/28/21 ?1604 09/29/21 ?0500  ?NA 138 134* 131*  ?K 3.5 3.5 3.3*  ?CL 104 101 102  ?CO2 '29 25 24  '$ ?GLUCOSE 107* 119* 123*  ?BUN '14 14 8  '$ ?CREATININE 0.68 0.77 0.78  ?CALCIUM 8.8* 8.6* 8.1*  ?MG  --   --  1.3*  ? ?GFR: ?Estimated Creatinine Clearance: 111.5 mL/min (by C-G formula based on SCr of 0.78 mg/dL). ?Liver Function Tests: ?Recent Labs  ?Lab 09/24/21 ?0855 09/28/21 ?0092  ?AST 16 19  ?ALT 27 31  ?ALKPHOS 128* 122  ?BILITOT 0.3 0.8  ?PROT 6.9 6.8  ?ALBUMIN 3.5 3.4*  ? ?Recent Labs  ?Lab 09/28/21 ?1604  ?LIPASE 31  ? ?No results for input(s): AMMONIA in the last 168 hours. ?Coagulation Profile: ?No results for input(s): INR, PROTIME in the last 168 hours. ?Cardiac Enzymes: ?No results for input(s): CKTOTAL, CKMB, CKMBINDEX, TROPONINI in the last 168 hours. ?BNP (last 3 results) ?No results for input(s): PROBNP in the last 8760 hours. ?HbA1C: ?No results for input(s): HGBA1C in the last 72 hours. ?CBG: ?No results for input(s): GLUCAP in the last 168 hours. ?Lipid Profile: ?No results for input(s): CHOL, HDL, LDLCALC, TRIG, CHOLHDL, LDLDIRECT in the last 72 hours. ?Thyroid Function Tests: ?No results for input(s): TSH, T4TOTAL, FREET4, T3FREE, THYROIDAB in the last 72 hours. ?Anemia Panel: ?No results for input(s): VITAMINB12, FOLATE, FERRITIN, TIBC, IRON, RETICCTPCT in the last 72 hours. ?Sepsis Labs: ?Recent Labs  ?Lab 09/28/21 ?1825 09/28/21 ?2200  ?LATICACIDVEN 2.4* 1.5  ? ? ?Recent Results (from the past 240 hour(s))  ?Blood culture (routine x 2)     Status: None (Preliminary result)  ? Collection Time: 09/28/21  6:25 PM  ? Specimen: BLOOD  ?Result Value Ref Range Status  ? Specimen Description   Final  ?  BLOOD BLOOD RIGHT  FOREARM ?Performed at [facility], [place] [address] [place], [place] [address] ?  ? Special Requests   Final  ?  BOTTLES DRAWN AEROBIC AND ANAEROBIC Blood Culture adequate volume ?Performed at [facility], [place] [address], [place], [place] [address] ?  ? Culture   Final  ?  NO GROWTH < 12 HOURS ?Performed at [facility], [place] [address], [place], [place] [address] ?  ? Report Status PENDING  Incomplete  ?Blood culture (routine x 2)     Status: None (Preliminary result)  ? Collection Time: 09/28/21  6:30 PM  ? Specimen: BLOOD  ?Result Value Ref Range Status  ? Specimen Description   Final  ?  BLOOD RIGHT ANTECUBITAL ?[facility], [place] [address] [place], [place] [address] ?  ? Special Requests   Final  ?  BOTTLES DRAWN AEROBIC AND ANAEROBIC Blood Culture results may not be optimal due to an inadequate volume of blood received in culture bottles ?Performed at [facility], [place] [address] [place], [place] [address] ?  ? Culture   Final  ?  NO GROWTH < 12 HOURS ?Performed at [facility], [place] [address], [place], [place] [address] ?  ? Report Status PENDING  Incomplete  ?Resp Panel by RT-PCR (Flu A&B, Covid) Nasopharyngeal Swab     Status: None  ? Collection Time: 09/28/21  6:41 PM  ? Specimen: Nasopharyngeal Swab; Nasopharyngeal(NP) swabs in vial transport medium  ?Result Value Ref Range Status  ? SARS Coronavirus 2 by RT PCR NEGATIVE NEGATIVE Final  ?  Comment: (NOTE) ?SARS-CoV-2 target nucleic acids are NOT DETECTED. ? ?The SARS-CoV-2 RNA is generally detectable in upper respiratory ?specimens during the acute phase of infection. The lowest ?concentration of SARS-CoV-2 viral copies this assay can detect is ?138 copies/mL. A negative result does not preclude SARS-Cov-2 ?infection and should not be used as the sole basis for treatment or ?other patient management decisions. A negative result may occur with  ?improper  specimen collection/handling, submission of specimen other ?than nasopharyngeal swab, presence of viral mutation(s) within the ?areas targeted by this assay, and inadequate number of viral ?copies(<138 copies/mL). A negative result must be combined w

## 2021-09-30 DIAGNOSIS — A0472 Enterocolitis due to Clostridium difficile, not specified as recurrent: Secondary | ICD-10-CM

## 2021-09-30 DIAGNOSIS — D638 Anemia in other chronic diseases classified elsewhere: Secondary | ICD-10-CM

## 2021-09-30 DIAGNOSIS — E876 Hypokalemia: Secondary | ICD-10-CM

## 2021-09-30 DIAGNOSIS — D709 Neutropenia, unspecified: Secondary | ICD-10-CM | POA: Diagnosis not present

## 2021-09-30 DIAGNOSIS — E871 Hypo-osmolality and hyponatremia: Secondary | ICD-10-CM

## 2021-09-30 DIAGNOSIS — F418 Other specified anxiety disorders: Secondary | ICD-10-CM | POA: Diagnosis not present

## 2021-09-30 DIAGNOSIS — C50112 Malignant neoplasm of central portion of left female breast: Secondary | ICD-10-CM | POA: Diagnosis not present

## 2021-09-30 DIAGNOSIS — L932 Other local lupus erythematosus: Secondary | ICD-10-CM

## 2021-09-30 LAB — CBC WITH DIFFERENTIAL/PLATELET
Abs Immature Granulocytes: 0 10*3/uL (ref 0.00–0.07)
Basophils Absolute: 0 10*3/uL (ref 0.0–0.1)
Basophils Relative: 0 %
Eosinophils Absolute: 0 10*3/uL (ref 0.0–0.5)
Eosinophils Relative: 5 %
HCT: 30.6 % — ABNORMAL LOW (ref 36.0–46.0)
Hemoglobin: 10 g/dL — ABNORMAL LOW (ref 12.0–15.0)
Immature Granulocytes: 0 %
Lymphocytes Relative: 57 %
Lymphs Abs: 0.1 10*3/uL — ABNORMAL LOW (ref 0.7–4.0)
MCH: 29.2 pg (ref 26.0–34.0)
MCHC: 32.7 g/dL (ref 30.0–36.0)
MCV: 89.5 fL (ref 80.0–100.0)
Monocytes Absolute: 0 10*3/uL — ABNORMAL LOW (ref 0.1–1.0)
Monocytes Relative: 5 %
Neutro Abs: 0.1 10*3/uL — CL (ref 1.7–7.7)
Neutrophils Relative %: 33 %
Platelets: 161 10*3/uL (ref 150–400)
RBC: 3.42 MIL/uL — ABNORMAL LOW (ref 3.87–5.11)
RDW: 15 % (ref 11.5–15.5)
WBC: 0.2 10*3/uL — CL (ref 4.0–10.5)
nRBC: 0 % (ref 0.0–0.2)

## 2021-09-30 LAB — GASTROINTESTINAL PANEL BY PCR, STOOL (REPLACES STOOL CULTURE)

## 2021-09-30 LAB — COMPREHENSIVE METABOLIC PANEL
ALT: 23 U/L (ref 0–44)
AST: 16 U/L (ref 15–41)
Albumin: 2.8 g/dL — ABNORMAL LOW (ref 3.5–5.0)
Alkaline Phosphatase: 100 U/L (ref 38–126)
Anion gap: 8 (ref 5–15)
BUN: 9 mg/dL (ref 6–20)
CO2: 20 mmol/L — ABNORMAL LOW (ref 22–32)
Calcium: 8.3 mg/dL — ABNORMAL LOW (ref 8.9–10.3)
Chloride: 105 mmol/L (ref 98–111)
Creatinine, Ser: 0.85 mg/dL (ref 0.44–1.00)
GFR, Estimated: >60 mL/min (ref >60)
Glucose, Bld: 95 mg/dL (ref 70–99)
Potassium: 3.8 mmol/L (ref 3.5–5.1)
Sodium: 133 mmol/L — ABNORMAL LOW (ref 135–145)
Total Bilirubin: 0.8 mg/dL (ref 0.3–1.2)
Total Protein: 6.4 g/dL — ABNORMAL LOW (ref 6.5–8.1)

## 2021-09-30 LAB — MAGNESIUM: Magnesium: 2 mg/dL (ref 1.7–2.4)

## 2021-09-30 MED ORDER — HYDROCERIN EX CREA
TOPICAL_CREAM | Freq: Four times a day (QID) | CUTANEOUS | Status: DC | PRN
  Administered 2021-09-30: 1 via TOPICAL
  Filled 2021-09-30: qty 113

## 2021-09-30 MED ORDER — DIPHENHYDRAMINE HCL 25 MG PO CAPS
25.0000 mg | ORAL_CAPSULE | Freq: Four times a day (QID) | ORAL | Status: DC | PRN
  Administered 2021-09-30 – 2021-10-01 (×3): 25 mg via ORAL
  Filled 2021-09-30 (×3): qty 1

## 2021-09-30 MED ORDER — VANCOMYCIN 50 MG/ML ORAL SOLUTION
125.0000 mg | Freq: Four times a day (QID) | ORAL | Status: DC
  Administered 2021-09-30 – 2021-10-01 (×4): 125 mg via ORAL
  Filled 2021-09-30 (×5): qty 2.5

## 2021-09-30 MED ORDER — SODIUM CHLORIDE 0.9 % IV SOLN: INTRAVENOUS | Status: AC

## 2021-09-30 MED ORDER — ONDANSETRON HCL 4 MG/2ML IJ SOLN
4.0000 mg | Freq: Four times a day (QID) | INTRAMUSCULAR | Status: DC
  Administered 2021-09-30 – 2021-10-04 (×16): 4 mg via INTRAVENOUS
  Filled 2021-09-30 (×17): qty 2

## 2021-09-30 NOTE — Progress Notes (Signed)
?PROGRESS NOTE ? ? ? ?Caitlyn Hendricks  PXT:[id number] DOB: 1980-10-11 DOA: 09/28/2021 ?PCP: [name], MD  ? ?Brief Narrative:  ?41 y.o. female with medical history significant for stage IIIb left breast cancer (s/p radical mastectomy, chemoradiation) on active therapy with [name] (most recent dose was on 09/24/2021), cutaneous lupus, depression/anxiety/PTSD who presented for evaluation of nausea, vomiting, diarrhea.  On presentation, Tmax was 100.7.  WBC 0.3, ANC 0, lactic acid 2.4.  She was started on IV fluids and broad-spectrum antibiotics.  Stool testing was ordered.  Chest and abdominal x-ray showed stable left upper airspace disease and positive bowel gas without evidence of high-grade obstruction or ileus.  Oncology was consulted. ? ?Assessment & Plan: ?  ?Febrile neutropenia ?-Presented with Tmax of 100.7 and WBC 0.3, ANC 0.0.  Suspect secondary to [place] ?-Currently on broad-spectrum antibiotics.  Blood cultures negative so far.  DC IV vancomycin.  Continue cefepime for another 24 hours.  COVID-19/influenza testing negative on presentation stool studies pending ?-Tmax 102 over the last 24 hours. ?-ANC 0.1 again today.  Oncology following.  Patient had received growth factor on 3 3 09/25/2021 ?-Repeat a.m. labs ? ?Nausea, vomiting, diarrhea ?Possible C. difficile colitis ?-Possibly from chemotherapy and C. difficile colitis.  Still nauseous with significant diarrhea.  Stool tested positive for C. difficile antigen and PCR, negative for toxin.  Because of severe diarrhea, patient will be treated for C. difficile colitis with oral vancomycin. ?-Stool for GI PCR is pending. ?-Still intermittently nauseous.  Switch Zofran to around-the-clock.  Continue as needed Compazine.  Zyprexa at night was added by oncology.  Will advance diet to soft diet if tolerated. ? ?Left breast cancer ?-Status post radical mastectomy and chemoradiation; currently on chemotherapy.  Follows up with oncology/Dr. [name] ?-Follow  oncology recommendations ? ?Depression with anxiety ?-Continue home regimen ? ?Cutaneous lupus erythematosus ?-Home regimen (Plaquenil) on hold because of leukopenia.  Outpatient follow-up with rheumatology ? ?Anemia of chronic disease ?-From cancer.  Hemoglobin stable ? ?Hyponatremia ?-Mild.  Monitor. ? ?Lactic acidosis ?-Resolved ? ?Hypokalemia ?-Replaced.  Resolved. ? ?Hypomagnesemia ?-Replace.  Resolved. ? ?Obesity ?-Outpatient follow-up ? ?DVT prophylaxis: Lovenox ?Code Status: Full ?Family Communication: Mother at bedside ?Disposition Plan: ?Status is: inpatient because: Of severity of illness.  Need for IV antibiotics and fluids. ? ?Consultants: oncology ? ?Procedures: None ? ?Antimicrobials:  ?Cefepime and vancomycin from 09/28/2021 onwards ? ?Subjective: ?Patient seen and examined at bedside.  Still having intermittent  nausea, vomiting and diarrhea.  Still feels weak.  No overnight chest pain, worsening shortness of breath reported.  Denies any cough. ?Objective: ?Vitals:  ? 09/29/21 1353 09/29/21 1901 09/30/21 0400 09/30/21 0649  ?BP: 123/69 117/70 (!) 126/96   ?Pulse: (!) 104 (!) 110 (!) 109   ?Resp: '15 18 18   '$ ?Temp: 98.5 ?F (36.9 ?C) 98.3 ?F (36.8 ?C) 98.4 ?F (36.9 ?C)   ?TempSrc:  Oral Oral   ?SpO2: 97% 100% 98%   ?Weight:      ?Height:    '5\' 4"'$  (1.626 m)  ? ? ?Intake/Output Summary (Last 24 hours) at 09/30/2021 0758 ?Last data filed at 09/30/2021 0258 ?Gross per 24 hour  ?Intake 1353.72 ml  ?Output --  ?Net 1353.72 ml  ? ? ?Filed Weights  ? 09/29/21 0646  ?Weight: 103.3 kg  ? ? ?Examination: ? ?General: On room air.  No distress ?ENT/neck: No thyromegaly.  JVD is not elevated  ?respiratory: Decreased breath sounds at bases bilaterally with some crackles; no wheezing CVS: S1-S2 heard;  intermittently tachycardic  ?abdominal: Soft, obese, nontender, slightly distended; no organomegaly, normal bowel sounds are heard ?Extremities: Trace lower extremity edema; no cyanosis  ?CNS: Awake and alert.  No focal  neurologic deficit.  Moves extremities ?Lymph: No obvious lymphadenopathy ?Skin: No obvious ecchymosis/lesions  ?psych: Intermittently anxious looking.  No signs of agitation.   ?Musculoskeletal: No obvious joint swelling/deformity ? ? ? ? ?Data Reviewed: I have personally reviewed following labs and imaging studies ? ?CBC: ?Recent Labs  ?Lab 09/24/21 ?0855 09/28/21 ?1604 09/29/21 ?0500 09/30/21 ?4492  ?WBC 1.0* 0.3* 0.2* 0.2*  ?NEUTROABS 0.5* 0.0* 0.0* 0.1*  ?HGB 11.4* 11.2* 10.2* 10.0*  ?HCT 36.3 34.1* 30.2* 30.6*  ?MCV 90.1 89.5 88.3 89.5  ?PLT 296 226 175 161  ? ? ?Basic Metabolic Panel: ?Recent Labs  ?Lab 09/24/21 ?0855 09/28/21 ?1604 09/29/21 ?0500 09/30/21 ?0100  ?NA 138 134* 131* 133*  ?K 3.5 3.5 3.3* 3.8  ?CL 104 101 102 105  ?CO2 '29 25 24 '$ 20*  ?GLUCOSE 107* 119* 123* 95  ?BUN '14 14 8 9  '$ ?CREATININE 0.68 0.77 0.78 0.85  ?CALCIUM 8.8* 8.6* 8.1* 8.3*  ?MG  --   --  1.3* 2.0  ? ? ?GFR: ?Estimated Creatinine Clearance: 105 mL/min (by C-G formula based on SCr of 0.85 mg/dL). ?Liver Function Tests: ?Recent Labs  ?Lab 09/24/21 ?7121 09/28/21 ?1604 09/30/21 ?9758  ?AST '16 19 16  '$ ?ALT '27 31 23  '$ ?ALKPHOS 128* 122 100  ?BILITOT 0.3 0.8 0.8  ?PROT 6.9 6.8 6.4*  ?ALBUMIN 3.5 3.4* 2.8*  ? ? ?Recent Labs  ?Lab 09/28/21 ?1604  ?LIPASE 31  ? ? ?No results for input(s): AMMONIA in the last 168 hours. ?Coagulation Profile: ?No results for input(s): INR, PROTIME in the last 168 hours. ?Cardiac Enzymes: ?No results for input(s): CKTOTAL, CKMB, CKMBINDEX, TROPONINI in the last 168 hours. ?BNP (last 3 results) ?No results for input(s): PROBNP in the last 8760 hours. ?HbA1C: ?No results for input(s): HGBA1C in the last 72 hours. ?CBG: ?No results for input(s): GLUCAP in the last 168 hours. ?Lipid Profile: ?No results for input(s): CHOL, HDL, LDLCALC, TRIG, CHOLHDL, LDLDIRECT in the last 72 hours. ?Thyroid Function Tests: ?No results for input(s): TSH, T4TOTAL, FREET4, T3FREE, THYROIDAB in the last 72 hours. ?Anemia Panel: ?No  results for input(s): VITAMINB12, FOLATE, FERRITIN, TIBC, IRON, RETICCTPCT in the last 72 hours. ?Sepsis Labs: ?Recent Labs  ?Lab 09/28/21 ?1825 09/28/21 ?2200  ?LATICACIDVEN 2.4* 1.5  ? ? ? ?Recent Results (from the past 240 hour(s))  ?Blood culture (routine x 2)     Status: None (Preliminary result)  ? Collection Time: 09/28/21  6:25 PM  ? Specimen: BLOOD  ?Result Value Ref Range Status  ? Specimen Description   Final  ?  BLOOD BLOOD RIGHT FOREARM ?Performed at [facility], [place] [address], [place], [place] [address] ?  ? Special Requests   Final  ?  BOTTLES DRAWN AEROBIC AND ANAEROBIC Blood Culture adequate volume ?Performed at [facility], [place] [address], [place], [place] [address] ?  ? Culture   Final  ?  NO GROWTH < 12 HOURS ?Performed at [facility], [place] [address], [place],  [address] ?  ? Report Status PENDING  Incomplete  ?Blood culture (routine x 2)     Status: None (Preliminary result)  ? Collection Time: 09/28/21  6:30 PM  ? Specimen: BLOOD  ?Result Value Ref Range Status  ? Specimen Description   Final  ?  BLOOD RIGHT ANTECUBITAL ?Performed at [facility], [place] [address] [place], [place] [address] ?  ? Special Requests   Final  ?  BOTTLES DRAWN AEROBIC AND ANAEROBIC Blood Culture results may not be optimal due to an inadequate volume of blood received in culture bottles ?Performed at [facility], [place] [address] [place], [place] [address] ?  ? Culture   Final  ?  NO GROWTH < 12 HOURS ?Performed at [facility], [place] [address] [place], [place] [address] ?  ? Report Status PENDING  Incomplete  ?Resp Panel by RT-PCR (Flu A&B, Covid) Nasopharyngeal Swab     Status: None  ? Collection Time: 09/28/21  6:41 PM  ? Specimen: Nasopharyngeal Swab; Nasopharyngeal(NP) swabs in vial transport medium  ?Result Value Ref Range Status  ? SARS Coronavirus 2 by RT PCR NEGATIVE NEGATIVE Final  ?  Comment: (NOTE) ?SARS-CoV-2  target nucleic acids are NOT DETECTED. ? ?The SARS-CoV-2 RNA is generally detectable in upper respiratory ?specimens during the acute phase of infection. The lowest ?concentration of SARS-CoV-2 viral copies thi

## 2021-09-30 NOTE — Progress Notes (Incomplete)
Aquacell;alginate with silver, xeroform, santyl, abdominal pads 8x10,  ?

## 2021-09-30 NOTE — TOC Initial Note (Signed)
Transition of Care (TOC) - Initial/Assessment Note  ? ? ?Patient Details  ?Name: Caitlyn Hendricks ?MRN: [id number] ?Date of Birth: [birth year] ? ?Transition of Care (TOC) CM/SW Contact:    ?Tawanna Cooler, RN ?Phone Number: ?09/30/2021, 4:34 PM ? ?Clinical Narrative:                 ? ?Transition of Care Department [facility]) has reviewed patient and no TOC needs have been identified at this time. We will continue to monitor patient advancement through interdisciplinary progression rounds. If new patient transition needs arise, please place a TOC consult. ? ? ?Expected Discharge Plan: Home/Self Care ?Barriers to Discharge: Continued Medical Work up ? ? ?Patient Goals and CMS Choice ?Patient states their goals for this hospitalization and ongoing recovery are:: return home ?  ?  ?Expected Discharge Plan and Services ?Expected Discharge Plan: Home/Self Care ?   ?  ?Living arrangements for the past 2 months: Single Family Home ?                ?  ? ?Prior Living Arrangements/Services ?Living arrangements for the past 2 months: [place] ?Lives with:: Significant Other ?Patient language and need for interpreter reviewed:: Yes ?Do you feel safe going back to the place where you live?: Yes      ?Need for Family Participation in Patient Care: Yes (Comment) ?Care giver support system in place?: Yes (comment) ?  ?Criminal Activity/Legal Involvement Pertinent to Current Situation/Hospitalization: No - Comment as needed ? 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") ?Patient able to express need for assistance with ADLs?: Yes ?Does  the patient have difficulty dressing or bathing?: No ?Independently performs ADLs?: No ?Communication: Independent ?Dressing (OT): Independent ?Grooming: Independent ?Feeding: Independent ?Bathing: Needs assistance ?Is this a change from baseline?: Change from baseline, expected to last <3 days ?Toileting: Needs assistance ?Is this a change from baseline?: Change from baseline, expected to last <3 days ?In/Out Bed: Needs assistance ?Is this a change from baseline?: Change from baseline, expected to last <3 days ?Walks in Home: Needs assistance ?Is this a change from baseline?: Change from baseline, expected to last <3 days ?Does the patient have difficulty walking or climbing stairs?: Yes ?Weakness of Legs: Both ?Weakness of Arms/Hands: Both ? ?  ?Orientation: : Oriented to Self, Oriented to Place, Oriented to  Time, Oriented to Situation ?Alcohol / Substance Use: Not Applicable ?Psych Involvement: No (comment) ? ?Admission diagnosis:  Febrile neutropenia ([place]) [D70.9, R50.81] ?Neutropenic fever ([place]) [D70.9, R50.81] ?Patient Active Problem List  ? Diagnosis Date Noted  ? C. difficile colitis 09/30/2021  ? Hyponatremia 09/30/2021  ? Hypokalemia 09/30/2021  ? Hypomagnesemia 09/30/2021  ? Anemia of chronic disease 09/30/2021  ? Neutropenic fever ([place]) 09/28/2021  ? Nausea, vomiting, and diarrhea 09/28/2021  ? Depression with anxiety 09/28/2021  ? Cutaneous lupus erythematosus 09/28/2021  ? S/P mastectomy, left 09/20/2020  ? Genetic testing 08/03/2020  ? Port-A-Cath in place 05/31/2020  ? Cancer of central portion of left female breast ([place]) 03/10/2020  ? ?PCP:  [name], MD ?Pharmacy:   ?[id number] [place], [place] - [address] ?[address] ?[facility] [place] [address]?Phone:  [phone] Fax: [phone] ? ?[facility], [place] - [address] ?[place] [address] ?Phone: [phone] Fax: [phone] ? ? ?

## 2021-09-30 NOTE — Progress Notes (Signed)
Dr. Starla Link notified of patient' rash, Benedryl ordered ?

## 2021-10-01 ENCOUNTER — Encounter: Payer: Self-pay | Admitting: Hematology

## 2021-10-01 ENCOUNTER — Other Ambulatory Visit (HOSPITAL_COMMUNITY): Payer: Self-pay

## 2021-10-01 DIAGNOSIS — D638 Anemia in other chronic diseases classified elsewhere: Secondary | ICD-10-CM | POA: Diagnosis not present

## 2021-10-01 DIAGNOSIS — E871 Hypo-osmolality and hyponatremia: Secondary | ICD-10-CM

## 2021-10-01 DIAGNOSIS — A0472 Enterocolitis due to Clostridium difficile, not specified as recurrent: Secondary | ICD-10-CM | POA: Diagnosis not present

## 2021-10-01 DIAGNOSIS — R21 Rash and other nonspecific skin eruption: Secondary | ICD-10-CM

## 2021-10-01 DIAGNOSIS — E876 Hypokalemia: Secondary | ICD-10-CM

## 2021-10-01 DIAGNOSIS — R5081 Fever presenting with conditions classified elsewhere: Secondary | ICD-10-CM | POA: Diagnosis not present

## 2021-10-01 DIAGNOSIS — D709 Neutropenia, unspecified: Secondary | ICD-10-CM | POA: Diagnosis not present

## 2021-10-01 DIAGNOSIS — C50112 Malignant neoplasm of central portion of left female breast: Secondary | ICD-10-CM | POA: Diagnosis not present

## 2021-10-01 LAB — CBC WITH DIFFERENTIAL/PLATELET
Abs Immature Granulocytes: 0 10*3/uL (ref 0.00–0.07)
Basophils Absolute: 0 10*3/uL (ref 0.0–0.1)
Basophils Relative: 0 %
Eosinophils Absolute: 0 10*3/uL (ref 0.0–0.5)
Eosinophils Relative: 4 %
HCT: 29.2 % — ABNORMAL LOW (ref 36.0–46.0)
Hemoglobin: 9.5 g/dL — ABNORMAL LOW (ref 12.0–15.0)
Immature Granulocytes: 0 %
Lymphocytes Relative: 67 %
Lymphs Abs: 0.2 10*3/uL — ABNORMAL LOW (ref 0.7–4.0)
MCH: 29.1 pg (ref 26.0–34.0)
MCHC: 32.5 g/dL (ref 30.0–36.0)
MCV: 89.3 fL (ref 80.0–100.0)
Monocytes Absolute: 0 10*3/uL — ABNORMAL LOW (ref 0.1–1.0)
Monocytes Relative: 7 %
Neutro Abs: 0.1 10*3/uL — CL (ref 1.7–7.7)
Neutrophils Relative %: 22 %
Platelets: 152 10*3/uL (ref 150–400)
RBC: 3.27 MIL/uL — ABNORMAL LOW (ref 3.87–5.11)
RDW: 15.1 % (ref 11.5–15.5)
WBC: 0.3 10*3/uL — CL (ref 4.0–10.5)
nRBC: 0 % (ref 0.0–0.2)

## 2021-10-01 LAB — COMPREHENSIVE METABOLIC PANEL
ALT: 22 U/L (ref 0–44)
AST: 15 U/L (ref 15–41)
Albumin: 2.7 g/dL — ABNORMAL LOW (ref 3.5–5.0)
Alkaline Phosphatase: 96 U/L (ref 38–126)
Anion gap: 7 (ref 5–15)
BUN: 8 mg/dL (ref 6–20)
CO2: 21 mmol/L — ABNORMAL LOW (ref 22–32)
Calcium: 8.4 mg/dL — ABNORMAL LOW (ref 8.9–10.3)
Chloride: 107 mmol/L (ref 98–111)
Creatinine, Ser: 0.86 mg/dL (ref 0.44–1.00)
GFR, Estimated: >60 mL/min (ref >60)
Glucose, Bld: 100 mg/dL — ABNORMAL HIGH (ref 70–99)
Potassium: 3.6 mmol/L (ref 3.5–5.1)
Sodium: 135 mmol/L (ref 135–145)
Total Bilirubin: 0.3 mg/dL (ref 0.3–1.2)
Total Protein: 6.2 g/dL — ABNORMAL LOW (ref 6.5–8.1)

## 2021-10-01 LAB — MAGNESIUM: Magnesium: 1.9 mg/dL (ref 1.7–2.4)

## 2021-10-01 MED ORDER — DIPHENHYDRAMINE HCL 50 MG/ML IJ SOLN
25.0000 mg | Freq: Four times a day (QID) | INTRAMUSCULAR | Status: DC | PRN
  Administered 2021-10-01 – 2021-10-02 (×3): 25 mg via INTRAVENOUS
  Filled 2021-10-01 (×3): qty 1

## 2021-10-01 MED ORDER — VANCOMYCIN HCL 125 MG PO CAPS
125.0000 mg | ORAL_CAPSULE | Freq: Four times a day (QID) | ORAL | Status: DC
  Administered 2021-10-01 – 2021-10-04 (×13): 125 mg via ORAL
  Filled 2021-10-01 (×14): qty 1

## 2021-10-01 MED ORDER — TRIAMCINOLONE ACETONIDE 0.1 % EX CREA
TOPICAL_CREAM | Freq: Three times a day (TID) | CUTANEOUS | Status: DC
  Administered 2021-10-01: 1 via TOPICAL
  Filled 2021-10-01 (×2): qty 15

## 2021-10-01 MED ORDER — ADULT MULTIVITAMIN W/MINERALS CH
1.0000 | ORAL_TABLET | Freq: Every day | ORAL | Status: DC
  Administered 2021-10-01 – 2021-10-04 (×4): 1 via ORAL
  Filled 2021-10-01 (×4): qty 1

## 2021-10-01 NOTE — TOC Benefit Eligibility Note (Signed)
Patient Advocate Encounter ? ?Insurance verification completed.   ? ?The patient is currently admitted and upon discharge could be taking vancomycin 125 mg capsules. ? ?The current 30 day co-pay is, $4.00.  ? ?The patient is insured through [employer] [place]  ? ? ? ?Lyndel Safe, CPhT ?Pharmacy Patient Advocate Specialist ?[place] Patient Advocate Team ?Direct Number: [phone]  Fax: [phone] ? ? ? ? ? ?  ?

## 2021-10-01 NOTE — Progress Notes (Addendum)
HEMATOLOGY-ONCOLOGY PROGRESS NOTE ? ?ASSESSMENT AND PLAN: ?1.  Neutropenia and anemia secondary to chemotherapy ?2.  Breast cancer ?3.  Diarrhea secondary to C. difficile colitis ?4.  Diffuse maculopapular rash ?5.  Depression with anxiety ?6.  Cutaneous lupus erythematous ? ?-Labs from today have been reviewed.  Continues to have neutropenia.  She had a low-grade temp last evening but none today.  Received G-CSF following chemotherapy.  Monitor. ?-Hemoglobin 9.5 today.  Monitor.  Recommend PRBC transfusion for hemoglobin less than 7.5 ?-Continue oral vancomycin for possible C. difficile.  Would consider switching her to Dificid if diarrhea not improving soon. ?-Has rash likely due to medication.  However, patient thinks it may also be secondary to CHG bath.  She is receiving topical triamcinolone.  Currently on oral diphenhydramine which she states is not effective.  Asking for IV.  Will change diphenhydramine from p.o. to IV. ? ?Mikey Bussing, DNP, AGPCNP-BC, AOCNP ? ?SUBJECTIVE: Caitlyn Hendricks is followed by our office for breast cancer.  She is currently receiving systemic treatment with [name].  Now admitted with febrile neutropenia and diarrhea.  Stool tested positive for C. difficile antigen and PCR was negative for toxin.  She is on oral vancomycin. ? ?Today, she has ongoing diarrhea.  She has a rash on her abdomen and upper thighs.  Rash is pruritic.  Had a low-grade fever last night. ? ?Oncology History Overview Note  ?Cancer Staging ?Cancer of central portion of left female breast ([place]) ?Staging form: Breast, AJCC 8th Edition ?- Clinical stage from 03/08/2020: Stage IIIB (cT2, cN1, cM0, G3, ER-, PR-, HER2-) - Signed by Truitt Merle, MD on 03/10/2020 ?Stage prefix: Initial diagnosis ?Histologic grading system: 3 grade system ?- Pathologic stage from 09/20/2020: No Stage Recommended (ypT0, pN2a, cM0, G3, ER-, PR-, HER2-) - Signed by Gardenia Phlegm, NP on 10/04/2020 ?Stage prefix: Post-therapy ?Histologic  grading system: 3 grade system ? ?  ?Cancer of central portion of left female breast ([place])  ?02/23/2020 Mammogram  ? Diagnostic Mammogram 02/23/20  ?IMPRESSION ?The 2x1x2.6cm irregular mass in teh left breast at 12:00 posiiton middle depth is highly suspicious of malignancy. An [place] is recommended for further evaluation and biopsy planning purposes. ?  ?02/23/2020 Breast US  ? [name] 02/23/20  ?IMPRESSION ?2 adjacent spiculated masses in the left brast at 12:00 position 3 cm from the nipple (2.1x0.9x1.1cm and 1.1x1.4x0.5cm) is suggestive of malignancy.  ? ?Multiple abnormal left subpectoral and left axillary nodes measuring 3.3x2.1 cm concerning for metastatic adenopathy.  ? ? ?Left breast skin thickening and edema may be secondary congestive edema due to extensive axillary adenopathy.  ?  ?03/08/2020 Initial Biopsy  ? Diagnosis ?1.Breast, left, needle core biopsy, 12:00 position, 3cmfn ?-INVASIVE DUCTAL CARCINOMA ?-SEE COMMENT ? ?2. Lymph node, needle/core biopsy, left axilla ?-METASTATIC CARCINOMA INVOLVING A LYMPH NODE  ?-LYMPHOVASCULAR SPACE INVASION PRESENT  ? ?Microscopic Comment  ?1.Based on the biopsy the carcinoma appears Nottingham Grade 3 or 3 and measures 1 cm in the greatest linear extent.  ?  ?03/08/2020 Receptors her2  ? ER- Negative 0% ?PR - Negative 0% ?HER2 - Negative  ?KI 67 - 80% ? ?  ?03/08/2020 Cancer Staging  ? Staging form: Breast, AJCC 8th Edition ?- Clinical stage from 03/08/2020: Stage IIIB (cT2, cN1, cM0, G3, ER-, PR-, HER2-) - Signed by Truitt Merle, MD on 03/10/2020 ? ?  ?03/10/2020 Initial Diagnosis  ? Cancer of central portion of left female breast [facility]) ?  ?03/16/2020 Breast MRI  ? IMPRESSION: ?1. 8.1  x 7.8 x 6.6 cm biopsy proven invasive ductal carcinoma in the ?central right breast, involving 3 quadrants. ?2. 3.0 x 1.7 x 1.1 cm satellite mass more inferiorly in lower inner ?quadrant of the left breast, compatible with additional malignancy. ?3. Metastatic level 1 and level 2 left axillary lymph  nodes. ?4. No evidence of malignancy on the right. ?  ?03/17/2020 Imaging  ? IMPRESSION: ?CT CAP w contrast  ?1. Diffuse skin thickening in the left breast with left axillary and ?subpectoral lymphadenopathy, as well as a mildly enlarged left ?supraclavicular lymph node, which likely represents metastatic ?lymphadenopathy. No other definite extra nodal metastatic disease ?noted elsewhere in the chest, abdomen or pelvis. ?2. Large mass in the central anatomic pelvis which is of uncertain ?origin, potentially a large exophytic fibroid or a large solid mass ?arising from the right ovary. Further evaluation with pelvic ?ultrasound is strongly recommended. ?  ?03/21/2020 Imaging  ? Bone Scan  ?IMPRESSION: ?Apparent arthropathy at L5. No bony metastatic disease is ?demonstrable on this study. Scattered foci of abnormal uptake in a ?pelvic mass is of uncertain etiology given absence of calcification ?in this mass by CT. This mass compresses the urinary bladder. It is ?possible that some of the increased uptake in this area actually ?represents physiologic uptake within the bladder. ?  ?Kidneys noted in flank positions bilaterally. ?  ?  ?03/22/2020 - 08/16/2020 Neo-Adjuvant Chemotherapy  ? Neoadjuvant Adriamycin and Cytoxan q2weeks for 4 cycles starting 03/22/20-05/03/20 followed by weekly Taxol and Carboplatin for 12 weeks starting 05/17/20-08/16/20 ?  ?03/24/2020 Imaging  ? US Pelvis  ?IMPRESSION: ?1. Large pedunculated lesion directly contiguous with the uterine ?most suggestive of a large subserosal fibroid which measures up to ?8.2 cm. ?2. Additional 1 cm probable intramural fibroid in the right anterior ?uterine body. ?3. No other acute or worrisome pelvic abnormality. ?  ?03/29/2020 Genetic Testing  ? Negative genetic testing on the common hereditary cancer panel.  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  The Common Hereditary Gene Panel offered by [facility] includes sequencing and/or deletion duplication testing of the following  47 genes: APC, ATM, AXIN2, BARD1, BMPR1A, BRCA1, BRCA2, BRIP1, CDH1, CDK4, CDKN2A (p14ARF), CDKN2A (p16INK4a), CHEK2, CTNNA1, DICER1, EPCAM (Deletion/duplication testing only), GREM1 (promoter region deletion/duplication testing only), KIT, MEN1, MLH1, MSH2, MSH3, MSH6, MUTYH, NBN, NF1, NHTL1, PALB2, PDGFRA, PMS2, POLD1, POLE, PTEN, RAD50, RAD51C, RAD51D, SDHB, SDHC, SDHD, SMAD4, SMARCA4. STK11, TP53, TSC1, TSC2, and VHL.  The following genes were evaluated for sequence changes only: SDHA and HOXB13 c.251G>A variant only. The report date is 03/29/2020 ?  ?04/28/2020 - 10/27/2020 Antibody Plan  ? Added Keytruda q3weeks starting 04/28/20 to complete 1 year of treatment. Held since 10/27/20 due to skin rash and body aches.  ?  ?08/17/2020 Breast MRI  ? IMPRESSION: ?1. The biopsy proven malignancy in the left breast and the adjacent ?satellite lesion have resolved in the interval. No abnormalities in ?these locations today. ?2. No MRI evidence of malignancy in the right breast. ?3. No adenopathy identified today ?  ?09/20/2020 Surgery  ? LEFT MODIFIED RADICAL MASTECTOMY by Dr Donne Hazel ?  ?09/20/2020 Pathology Results  ? FINAL MICROSCOPIC DIAGNOSIS:  ? ?A. BREAST, LEFT, MODIFIED RADICAL MASTECTOMY:  ?- No residual invasive carcinoma in breast status post neoadjuvant  ?treatment.  ?- Metastatic carcinoma in (2) of (14) lymph nodes.  ?- Biopsy sites (one in breast, one in one of the positive lymph nodes).  ?- See oncology table.  ? ?B. ADDITIONAL AXILLARY CONTENTS, LEFT, DISSECTION:  ?-  Metastatic carcinoma in (5) of (5) lymph nodes.  ?  ?09/20/2020 Cancer Staging  ? Staging form: Breast, AJCC 8th Edition ?- Pathologic stage from 09/20/2020: No Stage Recommended (ypT0, pN2a, cM0, G3, ER-, PR-, HER2-) - Signed by Gardenia Phlegm, NP on 10/04/2020 ?Stage prefix: Post-therapy ?Histologic grading system: 3 grade system ? ?  ?10/30/2020 - 12/11/2020 Radiation Therapy  ? Adjuvant Radiation with Dr Sondra Come starting 10/30/20 ?   ?10/30/2020 -  Chemotherapy  ? Adjuvant Xeloda starting 10/30/20 at 1539m BID M-F while on RT. Held since 11/20/2020 due to recurrent skin rash ?  ?01/18/2021 - 03/17/2021 Chemotherapy  ? Patient is on Treatment Pl

## 2021-10-01 NOTE — Progress Notes (Signed)
Initial Nutrition Assessment ? ?DOCUMENTATION CODES:  ? ?Obesity unspecified ? ?INTERVENTION:  ? ?-Multivitamin with minerals daily ? ?-Reviewed stool bulking foods with patient ? ?-Reviewed neutropenic diet restrictions with patient ? ?NUTRITION DIAGNOSIS:  ? ?Increased nutrient needs related to cancer and cancer related treatments, diarrhea as evidenced by estimated needs. ? ?GOAL:  ? ?Patient will meet greater than or equal to 90% of their needs ? ?MONITOR:  ? ?PO intake, Labs, Weight trends, I & O's ? ?REASON FOR ASSESSMENT:  ? ?Malnutrition Screening Tool, Consult ?Assessment of nutrition requirement/status, Diet education ? ?ASSESSMENT:  ? ?41 y.o. female with medical history significant for stage IIIb left breast cancer (s/p radical mastectomy, chemoradiation) on active therapy with [name] (most recent dose was on 09/24/2021), cutaneous lupus, depression/anxiety/PTSD who presented for evaluation of nausea, vomiting, diarrhea. ? ?Patient in room, resting. Pt states she has been having persistent diarrhea for weeks now. She had developed N/V as well a few days following her last chemotherapy treatment on 3/21. Pt still having loose stools, less watery today and green in color. Pt asking what foods to eat to help bulk up stool. Provided food recommendations and also reviewed neutropenic precautions with pt as well per MD request. Pt appreciative of information.  ? ?States she ate solid food this morning consisting of eggs, grits and a biscuit. Mainly consumed liquids yesterday. Has Activia yogurt at bedside on ice.  ?Denies other nutrition impact symptoms.  ? ?Per weight records, very minimal weight changes. ? ?Medications: IV Zofran, IV Compazine ? ?Labs reviewed: ? C.diff + ? ?NUTRITION - FOCUSED PHYSICAL EXAM: ? ?No depletions noted. ? ?Diet Order:   ?Diet Order   ? ?       ?  DIET SOFT Room service appropriate? Yes; Fluid consistency: Thin  Diet effective now       ?  ? ?  ?  ? ?  ? ? ?EDUCATION NEEDS:   ? ?Education needs have been addressed ? ?Skin:  Skin Assessment: Reviewed RN Assessment ? ?Last BM:  3/27 -type 6 ? ?Height:  ? ?Ht Readings from Last 1 Encounters:  ?09/30/21 '5\' 4"'$  (1.626 m)  ? ? ?Weight:  ? ?Wt Readings from Last 1 Encounters:  ?09/29/21 103.3 kg  ? ? ?BMI:  Body mass index is 39.09 kg/m?. ? ?Estimated Nutritional Needs:  ? ?Kcal:  1800-2000 ? ?Protein:  85-100g ? ?Fluid:  2L/day ? ?Clayton Bibles, MS, RD, LDN ?Inpatient Clinical Dietitian ?Contact information available via Amion ? ?

## 2021-10-01 NOTE — Progress Notes (Signed)
CRITICAL VALUE STICKER ? ?CRITICAL VALUE: WBC: 0.3  ANC: 0.1 ? ?RECEIVER (on-site recipient of call): Trixie Rude, RN ? ?DATE & TIME NOTIFIED: 10/01/2021 at 0523 ? ?MESSENGER (representative from lab): Marvell Fuller ? ?MD NOTIFIED: Lang Snow, NP ? ?TIME OF NOTIFICATION: 0532 ? ?RESPONSE:  Awaiting response as of 0535.  ?

## 2021-10-01 NOTE — Progress Notes (Addendum)
Pt was in the bathroom and became dizzy. She returned back to bed and vital signs were taken. Bp-106/60  P-103  R-18 O2@ 98%  T-98.6. She had just eaten a banana, Peanut butter sandwich part of it ). ?

## 2021-10-01 NOTE — Progress Notes (Signed)
?PROGRESS NOTE ? ? ? ?Caitlyn Hendricks  SFK:[id number] DOB: Nov 26, 1980 DOA: 09/28/2021 ?PCP: [name], MD  ? ?Brief Narrative:  ?41 y.o. female with medical history significant for stage IIIb left breast cancer (s/p radical mastectomy, chemoradiation) on active therapy with [name] (most recent dose was on 09/24/2021), cutaneous lupus, depression/anxiety/PTSD who presented for evaluation of nausea, vomiting, diarrhea.  On presentation, Tmax was 100.7.  WBC 0.3, ANC 0, lactic acid 2.4.  She was started on IV fluids and broad-spectrum antibiotics.  Stool testing was ordered.  Chest and abdominal x-ray showed stable left upper airspace disease and positive bowel gas without evidence of high-grade obstruction or ileus.  Oncology was consulted.  Subsequently, stool tested positive for C. difficile antigen and PCR, negative for toxin: Started on oral vancomycin. ? ?Assessment & Plan: ?  ?Febrile neutropenia ?-Presented with Tmax of 100.7 and WBC 0.3, ANC 0.0.  Suspect secondary to [place] ?-Blood cultures negative so far.  Off IV vancomycin.  Will DC cefepime today and monitor.  Currently on oral vancomycin as below.   ?COVID-19/influenza testing negative on presentation stool studies pending ?-Tmax of 100 over the last 24 hours. ?-ANC 0.1 again today.  Oncology following.  Patient had received growth factor on 09/25/2021 ?-Repeat a.m. labs ? ?Nausea, vomiting, diarrhea ?Possible C. difficile colitis ?-Possibly from chemotherapy and C. difficile colitis.  Still nauseous with significant diarrhea.  Stool tested positive for C. difficile antigen and PCR, negative for toxin.  Because of severe diarrhea, she was started on oral vancomycin on 09/30/2021.  Will need at least 10-day course of therapy ?-Stool for GI PCR was negative. ?-Still intermittently nauseous.  Continue Zofran.  Continue as needed Compazine.  Zyprexa at night was added by oncology.  Currently on soft diet. ? ?Left breast cancer ?-Status post radical  mastectomy and chemoradiation; currently on chemotherapy.  Follows up with oncology/Dr. [name] ?-Oncology following. ? ?Diffuse maculopapular rash ?-Patient thinks that she has rash secondary to CHG bath.  Monitor.  Use topical triamcinolone.  Outpatient follow-up.  Might need dermatology evaluation if no improvement. ? ?Depression with anxiety ?-Continue home regimen ? ?Cutaneous lupus erythematosus ?-Home regimen (Plaquenil) on hold because of leukopenia.  Outpatient follow-up with rheumatology ? ?Anemia of chronic disease ?-From cancer.  Hemoglobin stable ? ?Hyponatremia ?-Resolved ? ?Lactic acidosis ?-Resolved ? ?Hypokalemia ?-Replaced.  Resolved. ? ?Hypomagnesemia ?-Replaced.  Resolved. ? ?Obesity ?-Outpatient follow-up ? ?DVT prophylaxis: Lovenox ?Code Status: Full ?Family Communication: Mother and sister at bedside on 10/01/2021 ?Disposition Plan: ?Status is: inpatient because: Of severity of illness.  ?Consultants: oncology ? ?Procedures: None ? ?Antimicrobials:  ?Cefepime and vancomycin from 09/28/2021 onwards ? ?Subjective: ?Patient seen and examined at bedside.  Still complains of intermittent nausea, vomiting and diarrhea.  Feels slightly better.  Denies worsening abdominal pain.  Complains of worsening rash. ?Objective: ?Vitals:  ? 09/30/21 0649 09/30/21 1459 09/30/21 2201 10/01/21 0522  ?BP:  112/78 128/81 111/64  ?Pulse:  (!) 108 (!) 108 100  ?Resp:  '17 18 18  '$ ?Temp:  98.2 ?F (36.8 ?C) 100 ?F (37.8 ?C) 97.9 ?F (36.6 ?C)  ?TempSrc:  Oral Oral Oral  ?SpO2:  97% 98% 98%  ?Weight:      ?Height: '5\' 4"'$  (1.626 m)     ? ? ?Intake/Output Summary (Last 24 hours) at 10/01/2021 0755 ?Last data filed at 10/01/2021 0400 ?Gross per 24 hour  ?Intake 1497 ml  ?Output --  ?Net 1497 ml  ? ? ?Filed Weights  ? 09/29/21 0646  ?  Weight: 103.3 kg  ? ? ?Examination: ? ?General: No acute distress, currently on room air ?ENT/neck: No elevated JVD.  No obvious masses  ?respiratory: Bilateral decreased breath sounds at bases with some  scattered crackles, no wheezing ?CVS: Tachycardic intermittently; S1-S2 heard  ?abdominal: Soft, obese, nontender, distended mildly; no organomegaly, bowel sounds heard ?Extremities: No clubbing; mild lower extremity edema present ?CNS: Alert and oriented.  No focal neurologic deficit.  Moving extremities. ?Lymph: No cervical lymphadenopathy ?Skin: No obvious lesions/petechiae.  Diffuse scattered maculopapular and erythematous rash around the neck, abdomen, legs ?Psych: Currently not agitated.  Looks anxious intermittently.   ?Musculoskeletal: No obvious joint deformity/tenderness/swelling ? ? ? ? ? ?Data Reviewed: I have personally reviewed following labs and imaging studies ? ?CBC: ?Recent Labs  ?Lab 09/24/21 ?0855 09/28/21 ?1604 09/29/21 ?0500 09/30/21 ?1856 10/01/21 ?3149  ?WBC 1.0* 0.3* 0.2* 0.2* 0.3*  ?NEUTROABS 0.5* 0.0* 0.0* 0.1* 0.1*  ?HGB 11.4* 11.2* 10.2* 10.0* 9.5*  ?HCT 36.3 34.1* 30.2* 30.6* 29.2*  ?MCV 90.1 89.5 88.3 89.5 89.3  ?PLT 296 226 175 161 152  ? ? ?Basic Metabolic Panel: ?Recent Labs  ?Lab 09/24/21 ?0855 09/28/21 ?1604 09/29/21 ?0500 09/30/21 ?7026 10/01/21 ?3785  ?NA 138 134* 131* 133* 135  ?K 3.5 3.5 3.3* 3.8 3.6  ?CL 104 101 102 105 107  ?CO2 '29 25 24 '$ 20* 21*  ?GLUCOSE 107* 119* 123* 95 100*  ?BUN '14 14 8 9 8  '$ ?CREATININE 0.68 0.77 0.78 0.85 0.86  ?CALCIUM 8.8* 8.6* 8.1* 8.3* 8.4*  ?MG  --   --  1.3* 2.0 1.9  ? ? ?GFR: ?Estimated Creatinine Clearance: 103.8 mL/min (by C-G formula based on SCr of 0.86 mg/dL). ?Liver Function Tests: ?Recent Labs  ?Lab 09/24/21 ?0855 09/28/21 ?1604 09/30/21 ?8850 10/01/21 ?2774  ?AST '16 19 16 15  '$ ?ALT '27 31 23 22  '$ ?ALKPHOS 128* 122 100 96  ?BILITOT 0.3 0.8 0.8 0.3  ?PROT 6.9 6.8 6.4* 6.2*  ?ALBUMIN 3.5 3.4* 2.8* 2.7*  ? ? ?Recent Labs  ?Lab 09/28/21 ?1604  ?LIPASE 31  ? ? ?No results for input(s): AMMONIA in the last 168 hours. ?Coagulation Profile: ?No results for input(s): INR, PROTIME in the last 168 hours. ?Cardiac Enzymes: ?No results for input(s):  CKTOTAL, CKMB, CKMBINDEX, TROPONINI in the last 168 hours. ?BNP (last 3 results) ?No results for input(s): PROBNP in the last 8760 hours. ?HbA1C: ?No results for input(s): HGBA1C in the last 72 hours. ?CBG: ?No results for input(s): GLUCAP in the last 168 hours. ?Lipid Profile: ?No results for input(s): CHOL, HDL, LDLCALC, TRIG, CHOLHDL, LDLDIRECT in the last 72 hours. ?Thyroid Function Tests: ?No results for input(s): TSH, T4TOTAL, FREET4, T3FREE, THYROIDAB in the last 72 hours. ?Anemia Panel: ?No results for input(s): VITAMINB12, FOLATE, FERRITIN, TIBC, IRON, RETICCTPCT in the last 72 hours. ?Sepsis Labs: ?Recent Labs  ?Lab 09/28/21 ?1825 09/28/21 ?2200  ?LATICACIDVEN 2.4* 1.5  ? ? ? ?Recent Results (from the past 240 hour(s))  ?Blood culture (routine x 2)     Status: None (Preliminary result)  ? Collection Time: 09/28/21  6:25 PM  ? Specimen: BLOOD  ?Result Value Ref Range Status  ? Specimen Description   Final  ?  BLOOD BLOOD RIGHT FOREARM ?Performed at [facility], [place] [address] [place], [place] [address] ?  ? Special Requests   Final  ?  BOTTLES DRAWN AEROBIC AND ANAEROBIC Blood Culture adequate volume ?Performed at [facility], [place] [address], [place], [place] [address] ?  ?  Culture   Final  ?  NO GROWTH 2 DAYS ?Performed at [facility], [place] [address] [place], [place] [address] ?  ? Report Status PENDING  Incomplete  ?Blood culture (routine x 2)     Status: None (Preliminary result)  ? Collection Time: 09/28/21  6:30 PM  ? Specimen: BLOOD  ?Result Value Ref Range Status  ? Specimen Description   Final  ?  BLOOD RIGHT ANTECUBITAL ?Performed at [facility], [place] [address], [place], [place] [address] ?  ? Special Requests   Final  ?  BOTTLES DRAWN AEROBIC AND ANAEROBIC Blood Culture results may not be optimal due to an inadequate volume of blood received in culture bottles ?Performed at [facility], [place] [address],  [place], [place] [address] ?  ? Culture   Final  ?  NO GROWTH 2 DAYS ?Performed at [facility], [place] [address] [place], [place] [address] ?  ? Report Status PENDING  Incomplete  ?Resp Panel by RT-PCR (Flu A&B,

## 2021-10-02 ENCOUNTER — Encounter: Payer: Self-pay | Admitting: Hematology

## 2021-10-02 DIAGNOSIS — C50112 Malignant neoplasm of central portion of left female breast: Secondary | ICD-10-CM | POA: Diagnosis not present

## 2021-10-02 DIAGNOSIS — D709 Neutropenia, unspecified: Secondary | ICD-10-CM | POA: Diagnosis not present

## 2021-10-02 DIAGNOSIS — A0472 Enterocolitis due to Clostridium difficile, not specified as recurrent: Secondary | ICD-10-CM | POA: Diagnosis not present

## 2021-10-02 DIAGNOSIS — D638 Anemia in other chronic diseases classified elsewhere: Secondary | ICD-10-CM | POA: Diagnosis not present

## 2021-10-02 LAB — BASIC METABOLIC PANEL
Anion gap: 7 (ref 5–15)
BUN: 13 mg/dL (ref 6–20)
CO2: 23 mmol/L (ref 22–32)
Calcium: 8.3 mg/dL — ABNORMAL LOW (ref 8.9–10.3)
Chloride: 103 mmol/L (ref 98–111)
Creatinine, Ser: 1.2 mg/dL — ABNORMAL HIGH (ref 0.44–1.00)
GFR, Estimated: 59 mL/min — ABNORMAL LOW (ref >60)
Glucose, Bld: 97 mg/dL (ref 70–99)
Potassium: 3.6 mmol/L (ref 3.5–5.1)
Sodium: 133 mmol/L — ABNORMAL LOW (ref 135–145)

## 2021-10-02 LAB — MAGNESIUM: Magnesium: 1.9 mg/dL (ref 1.7–2.4)

## 2021-10-02 MED ORDER — ALUM & MAG HYDROXIDE-SIMETH 200-200-20 MG/5ML PO SUSP: 15.0000 mL | ORAL | Status: DC | PRN

## 2021-10-02 MED ORDER — DIPHENHYDRAMINE HCL 50 MG/ML IJ SOLN
50.0000 mg | Freq: Four times a day (QID) | INTRAMUSCULAR | Status: DC | PRN
  Administered 2021-10-02 – 2021-10-04 (×8): 50 mg via INTRAVENOUS
  Filled 2021-10-02 (×8): qty 1

## 2021-10-02 MED ORDER — PANTOPRAZOLE SODIUM 40 MG PO TBEC
40.0000 mg | DELAYED_RELEASE_TABLET | Freq: Every day | ORAL | Status: DC
  Administered 2021-10-02 – 2021-10-04 (×3): 40 mg via ORAL
  Filled 2021-10-02 (×3): qty 1

## 2021-10-02 NOTE — Telephone Encounter (Signed)
Late entry: ? ?Pt's mother called stating pt is pale, light headed, and "just don't look right".  Pt's mother wants to know what to do.  Instructed pt's mother to take the pt to the nearest emergency room for further evaluation.  Pt's mom verbalized understanding and had no further questions.   ?

## 2021-10-02 NOTE — Progress Notes (Signed)
?PROGRESS NOTE ? ? ? ?Caitlyn Hendricks  VQX:[id number] DOB: 03-Dec-1980 DOA: 09/28/2021 ?PCP: [name], MD  ? ?Brief Narrative:  ?41 y.o. female with medical history significant for stage IIIb left breast cancer (s/p radical mastectomy, chemoradiation) on active therapy with Caitlyn Hendricks (most recent dose was on 09/24/2021), cutaneous lupus, depression/anxiety/PTSD who presented for evaluation of nausea, vomiting, diarrhea.  On presentation, Tmax was 100.7.  WBC 0.3, ANC 0, lactic acid 2.4.  She was started on IV fluids and broad-spectrum antibiotics.  Stool testing was ordered.  Chest and abdominal x-ray showed stable left upper airspace disease and positive bowel gas without evidence of high-grade obstruction or ileus.  Oncology was consulted.  Subsequently, stool tested positive for C. difficile antigen and PCR, negative for toxin: Started on oral vancomycin.  Broad-spectrum antibiotics have been discontinued. ? ?Assessment & Plan: ?  ?Febrile neutropenia ?-Presented with Tmax of 100.7 and WBC 0.3, ANC 0.0.  Suspect secondary to [place] ?-Blood cultures negative so far.  Off IV vancomycin.  Cefepime discontinued on 10/01/2021.  Currently on oral vancomycin as below.   ?COVID-19/influenza testing negative on presentation stool studies pending ?-No temperature spikes over the last 24 hours. ?-ANC 0.1 again today.  Oncology following.  Patient had received growth factor on 09/25/2021 ?-Repeat a.m. labs ? ?Nausea, vomiting, diarrhea ?Possible C. difficile colitis ?-Possibly from chemotherapy and C. difficile colitis.  Still nauseous with significant diarrhea, diarrhea slightly slowing down.  Stool tested positive for C. difficile antigen and PCR, negative for toxin.  Because of severe diarrhea, she was started on oral vancomycin on 09/30/2021.  Will need at least 10-day course of therapy.  If symptoms do not improve with oral vancomycin, might to try Dificid ?-Stool for GI PCR was negative. ?-Still intermittently  nauseous.  Continue Zofran.  Continue as needed Compazine.  Zyprexa at night was added by oncology.  Currently on soft diet. ? ?Left breast cancer ?-Status post radical mastectomy and chemoradiation; currently on chemotherapy.  Follows up with oncology/Dr. [name] ?-Oncology following. ? ?Diffuse maculopapular rash ?-Patient thinks that she has rash secondary to CHG bath.  Monitor.  Use topical triamcinolone.  Outpatient follow-up.  Might need dermatology evaluation if no improvement. ? ?Depression with anxiety ?-Continue home regimen ? ?Cutaneous lupus erythematosus ?-Home regimen (Plaquenil) on hold because of leukopenia.  Outpatient follow-up with rheumatology ? ?Anemia of chronic disease ?-From cancer.  Hemoglobin stable ? ?Hyponatremia ?-Mild.  Encourage oral intake.   ? ?Lactic acidosis ?-Resolved ? ?Hypokalemia ?-Replaced.  Resolved. ? ?Hypomagnesemia ?-Replaced.  Resolved. ? ?Obesity ?-Outpatient follow-up ? ?DVT prophylaxis: Lovenox ?Code Status: Full ?Family Communication: Mother and sister at bedside on 10/02/2021 ?Disposition Plan: ?Status is: inpatient because: Of severity of illness.  ?Consultants: oncology ? ?Procedures: None ? ?Antimicrobials:  ?Anti-infectives (From admission, onward)  ? ? Start     Dose/Rate Route Frequency Ordered Stop  ? 10/01/21 1200  vancomycin (VANCOCIN) capsule 125 mg       ? 125 mg Oral Every 6 hours 10/01/21 0743    ? 09/30/21 0815  vancomycin (VANCOCIN) 50 mg/mL oral solution SOLN 125 mg  Status:  Discontinued       ? 125 mg Oral Every 6 hours 09/30/21 0805 10/01/21 0742  ? 09/29/21 1100  vancomycin (VANCOCIN) IVPB 1000 mg/200 mL premix  Status:  Discontinued       ? 1,000 mg ?200 mL/hr over 60 Minutes Intravenous Every 12 hours 09/29/21 1001 09/30/21 0804  ? 09/29/21 0400  ceFEPIme (MAXIPIME) 2 g  in sodium chloride 0.9 % 100 mL IVPB  Status:  Discontinued       ? 2 g ?200 mL/hr over 30 Minutes Intravenous Every 8 hours 09/28/21 2000 10/01/21 0900  ? 09/28/21 2200   hydroxychloroquine (PLAQUENIL) tablet 200 mg  Status:  Discontinued       ? 200 mg Oral 2 times daily 09/28/21 1958 09/28/21 2016  ? 09/28/21 1815  vancomycin (VANCOCIN) IVPB 1000 mg/200 mL premix       ? 1,000 mg ?200 mL/hr over 60 Minutes Intravenous  Once 09/28/21 1808 09/29/21 0751  ? 09/28/21 1815  ceFEPIme (MAXIPIME) 2 g in sodium chloride 0.9 % 100 mL IVPB       ? 2 g ?200 mL/hr over 30 Minutes Intravenous STAT 09/28/21 1814 09/29/21 0751  ? ?  ? ? ? ?Subjective: ?Patient seen and examined at bedside.  Denies overnight fever.  Still nauseous intermittently.  Still has a lot of diarrhea but feels that it is slightly slowing down.  Oral intake is improving but not great yet. ?Objective: ?Vitals:  ? 10/01/21 1900 10/01/21 2101 10/01/21 2121 10/02/21 0520  ?BP: 106/60 97/64  106/87  ?Pulse: (!) 103 99 (!) 105 (!) 105  ?Resp: '18 18  16  '$ ?Temp: 98.6 ?F (37 ?C) 98.3 ?F (36.8 ?C)  97.6 ?F (36.4 ?C)  ?TempSrc: Oral Oral  Oral  ?SpO2: 98% 96%  97%  ?Weight:      ?Height:      ? ? ?Intake/Output Summary (Last 24 hours) at 10/02/2021 0750 ?Last data filed at 10/01/2021 1115 ?Gross per 24 hour  ?Intake 250 ml  ?Output --  ?Net 250 ml  ? ? ?Filed Weights  ? 09/29/21 0646  ?Weight: 103.3 kg  ? ? ?Examination: ? ?General: On room air.  No distress  ?ENT/neck: No palpable thyromegaly.  JVD is not elevated  ?respiratory: Decreased breath sounds at bases bilaterally with some crackles  ?CVS: S1-S2 heard; currently tachycardic ?abdominal: Soft, obese, nontender, slightly distended; no organomegaly, normal bowel sounds are heard  ?extremities: Trace lower extremity edema present; no cyanosis  ?CNS: Awake and alert. No focal neurologic deficit.  Moves extremities ?Lymph: No obvious lymphadenopathy  ?skin: No obvious ecchymosis.   Diffuse scattered maculopapular and erythematous rash around the neck, abdomen, legs ?Psych: Intermittently smiling today.  No signs of agitation  ?musculoskeletal: No obvious joint swelling or  deformity ? ? ? ? ?Data Reviewed: I have personally reviewed following labs and imaging studies ? ?CBC: ?Recent Labs  ?Lab 09/28/21 ?1604 09/29/21 ?0500 09/30/21 ?6767 10/01/21 ?2094 10/02/21 ?0500  ?WBC 0.3* 0.2* 0.2* 0.3* 0.4*  ?NEUTROABS 0.0* 0.0* 0.1* 0.1* 0.1*  ?HGB 11.2* 10.2* 10.0* 9.5* 10.0*  ?HCT 34.1* 30.2* 30.6* 29.2* 31.0*  ?MCV 89.5 88.3 89.5 89.3 88.8  ?PLT 226 175 161 152 164  ? ? ?Basic Metabolic Panel: ?Recent Labs  ?Lab 09/28/21 ?1604 09/29/21 ?0500 09/30/21 ?7096 10/01/21 ?2836 10/02/21 ?0500  ?NA 134* 131* 133* 135 133*  ?K 3.5 3.3* 3.8 3.6 3.6  ?CL 101 102 105 107 103  ?CO2 25 24 20* 21* 23  ?GLUCOSE 119* 123* 95 100* 97  ?BUN '14 8 9 8 13  '$ ?CREATININE 0.77 0.78 0.85 0.86 1.20*  ?CALCIUM 8.6* 8.1* 8.3* 8.4* 8.3*  ?MG  --  1.3* 2.0 1.9 1.9  ? ? ?GFR: ?Estimated Creatinine Clearance: 74.4 mL/min (A) (by C-G formula based on SCr of 1.2 mg/dL (H)). ?Liver Function Tests: ?Recent Labs  ?Lab 09/28/21 ?1604 09/30/21 ?  7614 10/01/21 ?7092  ?AST '19 16 15  '$ ?ALT '31 23 22  '$ ?ALKPHOS 122 100 96  ?BILITOT 0.8 0.8 0.3  ?PROT 6.8 6.4* 6.2*  ?ALBUMIN 3.4* 2.8* 2.7*  ? ? ?Recent Labs  ?Lab 09/28/21 ?1604  ?LIPASE 31  ? ? ?No results for input(s): AMMONIA in the last 168 hours. ?Coagulation Profile: ?No results for input(s): INR, PROTIME in the last 168 hours. ?Cardiac Enzymes: ?No results for input(s): CKTOTAL, CKMB, CKMBINDEX, TROPONINI in the last 168 hours. ?BNP (last 3 results) ?No results for input(s): PROBNP in the last 8760 hours. ?HbA1C: ?No results for input(s): HGBA1C in the last 72 hours. ?CBG: ?No results for input(s): GLUCAP in the last 168 hours. ?Lipid Profile: ?No results for input(s): CHOL, HDL, LDLCALC, TRIG, CHOLHDL, LDLDIRECT in the last 72 hours. ?Thyroid Function Tests: ?No results for input(s): TSH, T4TOTAL, FREET4, T3FREE, THYROIDAB in the last 72 hours. ?Anemia Panel: ?No results for input(s): VITAMINB12, FOLATE, FERRITIN, TIBC, IRON, RETICCTPCT in the last 72 hours. ?Sepsis Labs: ?Recent  Labs  ?Lab 09/28/21 ?1825 09/28/21 ?2200  ?LATICACIDVEN 2.4* 1.5  ? ? ? ?Recent Results (from the past 240 hour(s))  ?Blood culture (routine x 2)     Status: None (Preliminary result)  ? Collection Time: 09/28/21  6:25 PM  ? Specimen

## 2021-10-03 ENCOUNTER — Other Ambulatory Visit (HOSPITAL_COMMUNITY): Payer: Self-pay

## 2021-10-03 ENCOUNTER — Encounter: Payer: Self-pay | Admitting: Hematology

## 2021-10-03 ENCOUNTER — Other Ambulatory Visit: Payer: Self-pay

## 2021-10-03 DIAGNOSIS — D709 Neutropenia, unspecified: Secondary | ICD-10-CM | POA: Diagnosis not present

## 2021-10-03 DIAGNOSIS — R5081 Fever presenting with conditions classified elsewhere: Secondary | ICD-10-CM | POA: Diagnosis not present

## 2021-10-03 LAB — BASIC METABOLIC PANEL
Anion gap: 9 (ref 5–15)
BUN: 16 mg/dL (ref 6–20)
CO2: 24 mmol/L (ref 22–32)
Calcium: 8.5 mg/dL — ABNORMAL LOW (ref 8.9–10.3)
Chloride: 99 mmol/L (ref 98–111)
Creatinine, Ser: 1.16 mg/dL — ABNORMAL HIGH (ref 0.44–1.00)
GFR, Estimated: >60 mL/min (ref >60)
Glucose, Bld: 104 mg/dL — ABNORMAL HIGH (ref 70–99)
Potassium: 3.1 mmol/L — ABNORMAL LOW (ref 3.5–5.1)
Sodium: 132 mmol/L — ABNORMAL LOW (ref 135–145)

## 2021-10-03 LAB — CULTURE, BLOOD (ROUTINE X 2)
Culture: NO GROWTH
Culture: NO GROWTH
Special Requests: ADEQUATE

## 2021-10-03 LAB — CBC WITH DIFFERENTIAL/PLATELET
Abs Immature Granulocytes: 0.03 10*3/uL (ref 0.00–0.07)
Basophils Absolute: 0 10*3/uL (ref 0.0–0.1)
Basophils Relative: 2 %
Eosinophils Absolute: 0.1 10*3/uL (ref 0.0–0.5)
Eosinophils Relative: 6 %
HCT: 30.1 % — ABNORMAL LOW (ref 36.0–46.0)
Hemoglobin: 10 g/dL — ABNORMAL LOW (ref 12.0–15.0)
Immature Granulocytes: 3 %
Lymphocytes Relative: 30 %
Lymphs Abs: 0.3 10*3/uL — ABNORMAL LOW (ref 0.7–4.0)
MCH: 28.9 pg (ref 26.0–34.0)
MCHC: 33.2 g/dL (ref 30.0–36.0)
MCV: 87 fL (ref 80.0–100.0)
Monocytes Absolute: 0.1 10*3/uL (ref 0.1–1.0)
Monocytes Relative: 12 %
Neutro Abs: 0.4 10*3/uL — CL (ref 1.7–7.7)
Neutrophils Relative %: 47 %
Platelets: 201 10*3/uL (ref 150–400)
RBC: 3.46 MIL/uL — ABNORMAL LOW (ref 3.87–5.11)
RDW: 15.3 % (ref 11.5–15.5)
WBC: 0.9 10*3/uL — CL (ref 4.0–10.5)
nRBC: 0 % (ref 0.0–0.2)

## 2021-10-03 LAB — MAGNESIUM: Magnesium: 1.9 mg/dL (ref 1.7–2.4)

## 2021-10-03 MED ORDER — SENNOSIDES-DOCUSATE SODIUM 8.6-50 MG PO TABS: 1.0000 | ORAL_TABLET | Freq: Every evening | ORAL | Status: DC | PRN

## 2021-10-03 MED ORDER — POTASSIUM CHLORIDE 20 MEQ PO PACK: 40.0000 meq | PACK | Freq: Once | ORAL | Status: DC

## 2021-10-03 MED ORDER — OXYCODONE HCL 5 MG PO TABS: 5.0000 mg | ORAL_TABLET | ORAL | Status: DC | PRN

## 2021-10-03 MED ORDER — IPRATROPIUM-ALBUTEROL 0.5-2.5 (3) MG/3ML IN SOLN: 3.0000 mL | RESPIRATORY_TRACT | Status: DC | PRN

## 2021-10-03 MED ORDER — METOPROLOL TARTRATE 5 MG/5ML IV SOLN
5.0000 mg | INTRAVENOUS | Status: DC | PRN
Start: 2021-10-03 — End: 2021-10-04

## 2021-10-03 MED ORDER — HYDRALAZINE HCL 20 MG/ML IJ SOLN: 10.0000 mg | INTRAMUSCULAR | Status: DC | PRN

## 2021-10-03 MED ORDER — POTASSIUM CHLORIDE 10 MEQ/100ML IV SOLN
10.0000 meq | INTRAVENOUS | Status: AC
  Administered 2021-10-03 (×4): 10 meq via INTRAVENOUS
  Filled 2021-10-03: qty 100

## 2021-10-03 MED ORDER — TRAZODONE HCL 50 MG PO TABS: 50.0000 mg | ORAL_TABLET | Freq: Every evening | ORAL | Status: DC | PRN

## 2021-10-03 NOTE — Progress Notes (Signed)
Date and time results received: 10/03/21 0621 ? ? ?Test: ANC ?Critical Value: 0.4 ? ?Name of Provider Notified: Olena Heckle, NP ? ?Orders Received? Or Actions Taken?:Potassium chloride ordered ?

## 2021-10-03 NOTE — Discharge Planning (Signed)
Oncology Discharge Planning Admission Note ? ?[facility] ?Address: [address], [place], [place] [address] ?Hours of Operation:  8am - 5pm, Monday - Friday  ?Clinic Contact Information:  [phone] ? ?Oncology Care Team: ?Medical Oncologist:  Dr. Truitt Merle ? ?Dr. [name] and Myrtha Mantis - NP are aware of this hospital admission dated 09/29/21 and have assessed at bedside.  The cancer center will follow Caitlyn Hendricks?s inpatient care to assist with discharge planning as indicated by the oncologist.  We will arrange for any needed outpatient follow up closer to discharge if feasible.  ? ?Disclaimer:  This [facility] note does not imply a formal consult request has been made by the admitting attending for this admission or there will be an inpatient consult completed by oncology.  Please request oncology consults as per standard process as indicated. ?

## 2021-10-03 NOTE — TOC Benefit Eligibility Note (Signed)
Patient Advocate Encounter ? ?Prior Authorization for Dificid 200 mg tablets has been approved.   ? ?PA# [id number] ?Effective dates: 10/03/2021 through 10/03/2022 ? ?Patients co-pay is $4.00.  ? ? ? ?Lyndel Safe, CPhT ?Pharmacy Patient Advocate Specialist ?[facility] Patient Advocate Team ?Direct Number: [phone]  Fax: [phone]  ?

## 2021-10-03 NOTE — Progress Notes (Signed)
?PROGRESS NOTE ? ? ? ?Caitlyn Hendricks  QIW:[id number] DOB: August 04, 1980 DOA: 09/28/2021 ?PCP: [name], MD  ? ?Brief Narrative:  ?41 y.o. female with medical history significant for stage IIIb left breast cancer (s/p radical mastectomy, chemoradiation) on active therapy with [name] (most recent dose was on 09/24/2021), cutaneous lupus, depression/anxiety/PTSD who presented for evaluation of nausea, vomiting, diarrhea.  On presentation, Tmax was 100.7.  WBC 0.3, ANC 0, lactic acid 2.4.  She was started on IV fluids and broad-spectrum antibiotics.  Stool testing was ordered.  Chest and abdominal x-ray showed stable left upper airspace disease and positive bowel gas without evidence of high-grade obstruction or ileus.  Oncology was consulted.  Subsequently, stool tested positive for C. difficile antigen and PCR, negative for toxin: Started on oral vancomycin.  Broad-spectrum antibiotics have been discontinued. ? ? ?Assessment & Plan: ? Principal Problem: ?  Neutropenic fever ([place]) ?Active Problems: ?  Nausea, vomiting, and diarrhea ?  Cancer of central portion of left female breast ([place]) ?  Depression with anxiety ?  Cutaneous lupus erythematosus ?  C. difficile colitis ?  Hyponatremia ?  Hypokalemia ?  Hypomagnesemia ?  Anemia of chronic disease ?  Rash ?  ? ?Febrile neutropenia ?-WBC 0.9 suspect secondary to [place] ?-Cultures remain negative.  Currently on p.o. vancomycin for C. difficile ?-Continue to monitor.  Neutropenic precautions. ?  ?C. difficile colitis. ?-Diarrhea improving.  Immunosuppressed.  Continue PO vanc, complete 10 day course.  ?  ?Left breast cancer ?-Status post radical mastectomy and chemoradiation; currently on chemotherapy.  Follows up with oncology/Dr. [name] ?-Oncology following. ?  ?Diffuse maculopapular rash ?-Patient thinks that she has rash secondary to CHG bath.  Currently on Benadryl.  Continue to monitor ?  ?Depression with anxiety ?-Continue home regimen-Zyprexa, Zoloft ?   ?Cutaneous lupus erythematosus ?-Home regimen (Plaquenil) on hold because of leukopenia.  Outpatient follow-up with rheumatology ?  ?Anemia of chronic disease ?-Hemoglobin stable ?  ?Hyponatremia ?-Encourage oral intake ?  ?Obesity ?-Outpatient follow-up ? ? ? ?DVT prophylaxis: Lovenox ?Code Status: Full code ?Family Communication:   ? ?Continue PO Vanc for now, diarrhea improving. Hopefully dc tomorrow if cleared by Onc.  ? ? ?Nutritional status ? ? ? ?Signs/Symptoms: estimated needs ? ?Interventions: MVI, Education ? ?Body mass index is 38.07 kg/m?. ? ?  ? ? ? ? ? ?Subjective: ?Feeling well, no rash noted.  ?Diarrhea improved. Slightly more formed this morning, Had about 5 episodes in last 24 hrs.  ? ? ? ?Examination: ? ?General exam: Appears calm and comfortable  ?Respiratory system: Clear to auscultation. Respiratory effort normal. ?Cardiovascular system: S1 & S2 heard, RRR. No JVD, murmurs, rubs, gallops or clicks. No pedal edema. ?Gastrointestinal system: Abdomen is nondistended, soft and nontender. No organomegaly or masses felt. Normal bowel sounds heard. ?Central nervous system: Alert and oriented. No focal neurological deficits. ?Extremities: Symmetric 5 x 5 power. ?Skin: No rashes, lesions or ulcers ?Psychiatry: Judgement and insight appear normal. Mood & affect appropriate.  ? ? ? ?Objective: ?Vitals:  ? 10/02/21 1801 10/02/21 2043 10/02/21 2043 10/03/21 0500  ?BP: 133/75 126/77 126/77 115/72  ?Pulse: (!) 102 (!) 106 (!) 109 98  ?Resp: '16 16 16 18  '$ ?Temp: 98 ?F (36.7 ?C) 98.7 ?F (37.1 ?C) 98.7 ?F (37.1 ?C) 98.7 ?F (37.1 ?C)  ?TempSrc: Oral Oral Oral Oral  ?SpO2: 100% 97% 99% 96%  ?Weight:    100.6 kg  ?Height:      ? ? ?Intake/Output Summary (Last 24  hours) at 10/03/2021 0817 ?Last data filed at 10/02/2021 1447 ?Gross per 24 hour  ?Intake 480 ml  ?Output --  ?Net 480 ml  ? ?Filed Weights  ? 09/29/21 0646 10/03/21 0500  ?Weight: 103.3 kg 100.6 kg  ? ? ? ?Data Reviewed:  ? ?CBC: ?Recent Labs  ?Lab  09/29/21 ?0500 09/30/21 ?5102 10/01/21 ?5852 10/02/21 ?0500 10/03/21 ?7782  ?WBC 0.2* 0.2* 0.3* 0.4* 0.9*  ?NEUTROABS 0.0* 0.1* 0.1* 0.1* 0.4*  ?HGB 10.2* 10.0* 9.5* 10.0* 10.0*  ?HCT 30.2* 30.6* 29.2* 31.0* 30.1*  ?MCV 88.3 89.5 89.3 88.8 87.0  ?PLT 175 161 152 164 201  ? ?Basic Metabolic Panel: ?Recent Labs  ?Lab 09/29/21 ?0500 09/30/21 ?4235 10/01/21 ?3614 10/02/21 ?0500 10/03/21 ?4315  ?NA 131* 133* 135 133* 132*  ?K 3.3* 3.8 3.6 3.6 3.1*  ?CL 102 105 107 103 99  ?CO2 24 20* 21* 23 24  ?GLUCOSE 123* 95 100* 97 104*  ?BUN '8 9 8 13 16  '$ ?CREATININE 0.78 0.85 0.86 1.20* 1.16*  ?CALCIUM 8.1* 8.3* 8.4* 8.3* 8.5*  ?MG 1.3* 2.0 1.9 1.9 1.9  ? ?GFR: ?Estimated Creatinine Clearance: 75.9 mL/min (A) (by C-G formula based on SCr of 1.16 mg/dL (H)). ?Liver Function Tests: ?Recent Labs  ?Lab 09/28/21 ?1604 09/30/21 ?4008 10/01/21 ?6761  ?AST '19 16 15  '$ ?ALT '31 23 22  '$ ?ALKPHOS 122 100 96  ?BILITOT 0.8 0.8 0.3  ?PROT 6.8 6.4* 6.2*  ?ALBUMIN 3.4* 2.8* 2.7*  ? ?Recent Labs  ?Lab 09/28/21 ?1604  ?LIPASE 31  ? ?No results for input(s): AMMONIA in the last 168 hours. ?Coagulation Profile: ?No results for input(s): INR, PROTIME in the last 168 hours. ?Cardiac Enzymes: ?No results for input(s): CKTOTAL, CKMB, CKMBINDEX, TROPONINI in the last 168 hours. ?BNP (last 3 results) ?No results for input(s): PROBNP in the last 8760 hours. ?HbA1C: ?No results for input(s): HGBA1C in the last 72 hours. ?CBG: ?No results for input(s): GLUCAP in the last 168 hours. ?Lipid Profile: ?No results for input(s): CHOL, HDL, LDLCALC, TRIG, CHOLHDL, LDLDIRECT in the last 72 hours. ?Thyroid Function Tests: ?No results for input(s): TSH, T4TOTAL, FREET4, T3FREE, THYROIDAB in the last 72 hours. ?Anemia Panel: ?No results for input(s): VITAMINB12, FOLATE, FERRITIN, TIBC, IRON, RETICCTPCT in the last 72 hours. ?Sepsis Labs: ?Recent Labs  ?Lab 09/28/21 ?1825 09/28/21 ?2200  ?LATICACIDVEN 2.4* 1.5  ? ? ?Recent Results (from the past 240 hour(s))  ?Blood culture  (routine x 2)     Status: None (Preliminary result)  ? Collection Time: 09/28/21  6:25 PM  ? Specimen: BLOOD  ?Result Value Ref Range Status  ? Specimen Description   Final  ?  BLOOD BLOOD RIGHT FOREARM ?Performed at [facility], [place] [address], [place], [place] [address] ?  ? Special Requests   Final  ?  BOTTLES DRAWN AEROBIC AND ANAEROBIC Blood Culture adequate volume ?Performed at [facility], [place] [address], [place], [place] [address] ?  ? Culture   Final  ?  NO GROWTH 4 DAYS ?Performed at [facility], [place] [address], [place], [place] [address] ?  ? Report Status PENDING  Incomplete  ?Blood culture (routine x 2)     Status: None (Preliminary result)  ? Collection Time: 09/28/21  6:30 PM  ? Specimen: BLOOD  ?Result Value Ref Range Status  ? Specimen Description   Final  ?  BLOOD RIGHT ANTECUBITAL ?Performed at [facility], [place] [address] [place], [place] [address] ?  ?  Special Requests   Final  ?  BOTTLES DRAWN AEROBIC AND ANAEROBIC Blood Culture results may not be optimal due to an inadequate volume of blood received in culture bottles ?Performed at [facility], [place] [address], [place], [place] [address] ?  ? Culture   Final  ?  NO GROWTH 4 DAYS ?Performed at [facility], [place] [address], [place], [place] [address] ?  ? Report Status PENDING  Incomplete  ?Resp Panel by RT-PCR (Flu A&B, Covid) Nasopharyngeal Swab     Status: None  ? Collection Time: 09/28/21  6:41 PM  ? Specimen: Nasopharyngeal Swab; Nasopharyngeal(NP) swabs in vial transport medium  ?Result Value Ref Range Status  ? SARS Coronavirus 2 by RT PCR NEGATIVE NEGATIVE Final  ?  Comment: (NOTE) ?SARS-CoV-2 target nucleic acids are NOT DETECTED. ? ?The SARS-CoV-2 RNA is generally detectable in upper respiratory ?specimens during the acute phase of infection. The lowest ?concentration of SARS-CoV-2 viral copies this assay can detect is ?138 copies/mL. A negative  result does not preclude SARS-Cov-2 ?infection and should not be used as the sole basis for treatment or ?other patient management decisions. A negative result may occur with  ?improper specimen collection/handling, sub

## 2021-10-04 ENCOUNTER — Telehealth: Payer: Medicaid Other | Admitting: Hematology

## 2021-10-04 DIAGNOSIS — R5081 Fever presenting with conditions classified elsewhere: Secondary | ICD-10-CM | POA: Diagnosis not present

## 2021-10-04 DIAGNOSIS — D709 Neutropenia, unspecified: Secondary | ICD-10-CM | POA: Diagnosis not present

## 2021-10-04 LAB — CBC
HCT: 31.1 % — ABNORMAL LOW (ref 36.0–46.0)
Hemoglobin: 10.3 g/dL — ABNORMAL LOW (ref 12.0–15.0)
MCH: 28.8 pg (ref 26.0–34.0)
MCHC: 33.1 g/dL (ref 30.0–36.0)
MCV: 86.9 fL (ref 80.0–100.0)
Platelets: 299 10*3/uL (ref 150–400)
RBC: 3.58 MIL/uL — ABNORMAL LOW (ref 3.87–5.11)
RDW: 15.6 % — ABNORMAL HIGH (ref 11.5–15.5)
WBC: 2.2 10*3/uL — ABNORMAL LOW (ref 4.0–10.5)
nRBC: 0 % (ref 0.0–0.2)

## 2021-10-04 LAB — BASIC METABOLIC PANEL
Anion gap: 8 (ref 5–15)
BUN: 19 mg/dL (ref 6–20)
CO2: 24 mmol/L (ref 22–32)
Calcium: 8.5 mg/dL — ABNORMAL LOW (ref 8.9–10.3)
Chloride: 99 mmol/L (ref 98–111)
Creatinine, Ser: 1.04 mg/dL — ABNORMAL HIGH (ref 0.44–1.00)
GFR, Estimated: >60 mL/min (ref >60)
Glucose, Bld: 98 mg/dL (ref 70–99)
Potassium: 3.2 mmol/L — ABNORMAL LOW (ref 3.5–5.1)
Sodium: 131 mmol/L — ABNORMAL LOW (ref 135–145)

## 2021-10-04 LAB — MAGNESIUM: Magnesium: 1.9 mg/dL (ref 1.7–2.4)

## 2021-10-04 MED ORDER — POTASSIUM CHLORIDE CRYS ER 20 MEQ PO TBCR
40.0000 meq | EXTENDED_RELEASE_TABLET | Freq: Once | ORAL | Status: AC
  Administered 2021-10-04: 40 meq via ORAL
  Filled 2021-10-04: qty 2

## 2021-10-04 MED ORDER — VANCOMYCIN HCL 125 MG PO CAPS: 125.0000 mg | ORAL_CAPSULE | Freq: Four times a day (QID) | ORAL | 0 refills | Status: AC

## 2021-10-04 MED ORDER — FLUCONAZOLE 100 MG PO TABS: 100.0000 mg | ORAL_TABLET | Freq: Every day | ORAL | 0 refills | Status: AC

## 2021-10-04 MED ORDER — POTASSIUM CHLORIDE 10 MEQ/100ML IV SOLN
10.0000 meq | INTRAVENOUS | Status: AC
  Administered 2021-10-04 (×4): 10 meq via INTRAVENOUS
  Filled 2021-10-04: qty 100

## 2021-10-04 MED ORDER — HEPARIN SOD (PORK) LOCK FLUSH 100 UNIT/ML IV SOLN: 500.0000 [IU] | INTRAVENOUS | Status: DC | PRN

## 2021-10-04 NOTE — Progress Notes (Signed)
HEMATOLOGY-ONCOLOGY PROGRESS NOTE ? ?ASSESSMENT AND PLAN: ?1.  Neutropenia and anemia secondary to chemotherapy, improved  ?2.  Breast cancer ?3.  Diarrhea secondary to C. difficile colitis, improved  ?4.  Diffuse maculopapular rash, secondary to antibiotics  ?5.  Depression with anxiety ?6.  Cutaneous lupus erythematous ? ?Plan ?-She is clinically improving, diarrhea much better, she will be discharged home today. ?-Lab reviewed, leukopenia much improved, neutropenia precaution discussed with her again ?-She reports vaginal itchiness and discharge, will give 1 dose of fluconazole 100 mg ?-She has appointment scheduled with [place] next 2 Mondays for chemotherapy.  Due to her upcoming 2 trips to the beach and [place], I will cancel her chemo treatment, but keep her lab and office visit for follow-up, she agrees  ? ?Truitt Merle  ? ?SUBJECTIVE: Caitlyn Hendricks is overall feeling better, she had 3 bowel movement yesterday, semiformed, no abdominal pain.  She noticed vaginal discharge and itchiness, she believes is yeast infection.  Her skin rash is still itching, no other new complaints.  She is ready to go home today after IV potassium. ?                                   ? ? ?I have reviewed the past medical history, past surgical history, social history and family history with the patient and they are unchanged from previous note. ? ? ?PHYSICAL EXAMINATION: ?ECOG PERFORMANCE STATUS: 1 - Symptomatic but completely ambulatory ? ?Vitals:  ? 10/03/21 2242 10/04/21 0558  ?BP: (!) 97/55 (!) 104/59  ?Pulse: 98 100  ?Resp: 18 20  ?Temp: 97.9 ?F (36.6 ?C) 98.2 ?F (36.8 ?C)  ?SpO2: 97% 97%  ? ?Filed Weights  ? 09/29/21 0646 10/03/21 0500  ?Weight: 227 lb 11.8 oz (103.3 kg) 221 lb 12.5 oz (100.6 kg)  ? ? ?Intake/Output from previous day: ?03/29 0701 - 03/30 0700 ?In: 760 [P.O.:360; IV Piggyback:400] ?Out: -  ? ?Physical Exam ?Vitals reviewed.  ?HENT:  ?   Head: Normocephalic.  ?Eyes:  ?   General: No scleral icterus. ?   Conjunctiva/sclera:  Conjunctivae normal.  ?Cardiovascular:  ?   Rate and Rhythm: Normal rate and regular rhythm.  ?Pulmonary:  ?   Effort: Pulmonary effort is normal. No respiratory distress.  ?   Breath sounds: Normal breath sounds.  ?Abdominal:  ?   General: Bowel sounds are normal.  ?   Palpations: Abdomen is soft.  ?   Tenderness: There is no abdominal tenderness.  ?Skin: ?   General: Skin is warm and dry.  ?   Findings: Rash present.  ?   Comments: Rash noted to the abdomen and upper thighs  ?Neurological:  ?   Mental Status: She is alert and oriented to person, place, and time.  ?Psychiatric:     ?   Mood and Affect: Mood normal.     ?   Behavior: Behavior normal.     ?   Thought Content: Thought content normal.     ?   Judgment: Judgment normal.  ? ? ?LABORATORY DATA:  ?I have reviewed the data as listed ? ?  Latest Ref Rng & Units 10/04/2021  ?  6:31 AM 10/03/2021  ?  5:44 AM 10/02/2021  ?  5:00 AM  ?CMP  ?Glucose 70 - 99 mg/dL 98   104   97    ?BUN 6 - 20 mg/dL 19   16  13    ?Creatinine 0.44 - 1.00 mg/dL 1.04   1.16   1.20    ?Sodium 135 - 145 mmol/L 131   132   133    ?Potassium 3.5 - 5.1 mmol/L 3.2   3.1   3.6    ?Chloride 98 - 111 mmol/L 99   99   103    ?CO2 22 - 32 mmol/L '24   24   23    '$ ?Calcium 8.9 - 10.3 mg/dL 8.5   8.5   8.3    ? ? ?Lab Results  ?Component Value Date  ? WBC 2.2 (L) 10/04/2021  ? HGB 10.3 (L) 10/04/2021  ? HCT 31.1 (L) 10/04/2021  ? MCV 86.9 10/04/2021  ? PLT 299 10/04/2021  ? NEUTROABS 0.4 (LL) 10/03/2021  ? ? ?No results found for: CEA1, CEA, [id number], CA125, PSA1 ? ?DG Chest 2 View ? ?Result Date: 09/13/2021 ?CLINICAL DATA:  Shortness of breath EXAM: CHEST - 2 VIEW COMPARISON:  None. FINDINGS: Patchy opacities in the upper left lung. No pleural effusion or pneumothorax. Cardiomediastinal contours are within normal limits. IMPRESSION: Patchy opacities in the upper left lung may be infectious/inflammatory or may reflect metastatic disease. Electronically Signed   By: Macy Mis M.D.   On: 09/13/2021  12:38  ? ?CT Angio Chest Pulmonary Embolism (PE) W or WO Contrast ? ?Result Date: 09/13/2021 ?CLINICAL DATA:  Pleuritic left-sided chest pain, concern for pulmonary embolism EXAM: CT ANGIOGRAPHY CHEST WITH CONTRAST TECHNIQUE: Multidetector CT imaging of the chest was performed using the standard protocol during bolus administration of intravenous contrast. Multiplanar CT image reconstructions and MIPs were obtained to evaluate the vascular anatomy. RADIATION DOSE REDUCTION: This exam was performed according to the departmental dose-optimization program which includes automated exposure control, adjustment of the mA and/or kV according to patient size and/or use of iterative reconstruction technique. CONTRAST:  10m OMNIPAQUE IOHEXOL 350 MG/ML SOLN COMPARISON:  No prior CTA, correlation is made with CT chest abdomen pelvis 06/15/2021 FINDINGS: Cardiovascular: Satisfactory opacification of the pulmonary arteries to the segmental level. No evidence of pulmonary embolism. Normal heart size. No pericardial effusion. Right chest port with catheter tip approximating the mid to distal right SVC. Mediastinum/Nodes: Status post left axillary lymph node dissection. No enlarged mediastinal, hilar, or axillary lymph nodes. No acute abnormality in the thyroid, trachea, or esophagus. Lungs/Pleura: Consolidative opacities with some surrounding ground-glass in the left apex, anterior and lateral more inferior left upper lobe, and lateral left lower lobe (series 5, image 46, 87, 123, and 140). Linear opacities in the inferior right lower lobe, most likely atelectasis. No pleural effusion or pneumothorax. Upper Abdomen: No acute abnormality. Musculoskeletal: Status post left mastectomy with unchanged appearance of the chest wall and soft tissues. Review of the MIP images confirms the above findings. IMPRESSION: 1. Negative for pulmonary embolism. 2. Consolidative opacities with surrounding ground-glass in multiple locations in the left  lung, concerning for multifocal infection, including possibly atypical or fungal infection. Recommend follow-up CT in 3 months to ensure resolution. Electronically Signed   By: AMerilyn BabaM.D.   On: 09/13/2021 12:47  ? ?NM PET Image Initial (PI) Skull Base To Thigh ? ?Result Date: 09/13/2021 ?CLINICAL DATA:  Subsequent treatment strategy for left breast cancer. EXAM: NUCLEAR MEDICINE PET SKULL BASE TO THIGH TECHNIQUE: 11.2 mCi F-18 FDG was injected intravenously. Full-ring PET imaging was performed from the skull base to thigh after the radiotracer. CT data was obtained and used for attenuation correction and anatomic localization.  Fasting blood glucose: 100 mg/dl COMPARISON:  Multiple exams, including CT chest 06/15/2021 FINDINGS: Mediastinal blood pool activity: SUV max 3.2 Liver activity: SUV max NA NECK: Symmetric glottic and palatine tonsillar activity, likely physiologic. A left level IV lymph node measuring 0.9 cm in short axis on image 41 series 4 has maximum SUV of 16.5, compatible with malignancy. Incidental CT findings: none CHEST: Hypermetabolic left supraclavicular and right axillary adenopathy observed, index left supraclavicular node measuring 0.5 cm in short axis on image 49 series 4 with maximum SUV 6.4. Index right axillary lymph node 0.7 cm in short axis on image 72 series 4 with maximum SUV 7.1. There is very high activity in the right upper SVC along the distal Port-A-Cath, with associated calcification in this vicinity suggesting chronic mural thrombus/chronic fibrin sheath. Maximum SUV in this vicinity is 235.6, strongly favoring concentrated FDG from injection along the fibrin sheath/chronic calcification. There is also some mild activity in the Port-A-Cath and in the Port-A-Cath tubing suggesting that the Port-A-Cath was indeed used for the IV injection of radiopharmaceutical. Left mastectomy noted. There is low-grade activity along the cutaneous margin of the mastectomy site,  representative maximum SUV 4.1, without substantial focal high activity. This likely represents postoperative activity rather than residual local malignancy. New bandlike and nodular airspace opacity in the apicoposteri

## 2021-10-04 NOTE — Discharge Planning (Signed)
Oncology Discharge Planning Note ? ?[place] [facility] ?Address: [address], [place], [place] [address] ?Hours of Operation:  8am - 5pm, Monday - Friday  ?Clinic Contact Information:  [phone]) [phone] ? ?Oncology Care Team: ?Medical Oncologist:  Dr. Truitt Merle ? ?Patient Details: ?Name:  Caitlyn Hendricks, Caitlyn Hendricks ?MRN:   [id number] ?DOB:   1980/12/07 ?Reason for Current Admission: Neutropenic fever ([place]) ? ?Discharge Planning Narrative: ? Discharge follow-up appointments for oncology are current and available on the AVS and MyChart.   ?Upon discharge from the hospital, hematology/oncology's post discharge plan of care for the outpatient setting is: Per MD note:  10/08/21 [facility] flush/Lab and 10/15/21 office follow up with Dr. [name]  ? ?[name] will be called within two business days after discharge to review hematology/oncology's plan of care for full understanding.   ? ?Outpatient Oncology Specific Care Only: ?Oncology appointment transportation needs addressed?:  not applicable ?Oncology medication management for symptom management addressed?:  not applicable ?Chemo Alert Card reviewed?:  not applicable ?Immunotherapy Alert Card reviewed?:  not applicable ?

## 2021-10-04 NOTE — Discharge Summary (Addendum)
Physician Discharge Summary  ?Caitlyn Hendricks BSJ:[id number] DOB: 01/17/1981 DOA: 09/28/2021 ? ?PCP: [name], MD ? ?Admit date: 09/28/2021 ?Discharge date: 10/04/2021 ? ?Admitted From: Home ?Disposition: Home ? ?Recommendations for Outpatient Follow-up:  ?Follow up with PCP in 1-2 weeks ?Please obtain BMP/CBC in one week your next doctors visit.  ?Oral vancomycin has been prescribed for C. difficile infection, last day 10/10/2021. ?Follow-up outpatient oncology, to be arranged by their service. ?Outpatient follow-up with dermatology as soon as possible.  Patient states she can make these arrangements. ?One dose of PO fluconazole '100mg'$  ? ?Discharge Condition: Stable ?CODE STATUS: Full code ?Diet recommendation: Regular ? ?Brief/Interim Summary: ?41 y.o. female with medical history significant for stage IIIb left breast cancer (s/p radical mastectomy, chemoradiation) on active therapy with [name] (most recent dose was on 09/24/2021), cutaneous lupus, depression/anxiety/PTSD who presented for evaluation of nausea, vomiting, diarrhea.  On presentation, Tmax was 100.7.  WBC 0.3, ANC 0, lactic acid 2.4.  She was started on IV fluids and broad-spectrum antibiotics.  Stool testing was ordered.  Chest and abdominal x-ray showed stable left upper airspace disease and positive bowel gas without evidence of high-grade obstruction or ileus.  Oncology was consulted.  Subsequently, stool tested positive for C. difficile antigen and PCR, negative for toxin: Started on oral vancomycin.  Broad-spectrum antibiotics have been discontinued.  Today patient is doing much better on p.o. vancomycin, her diarrhea has subsided, WBC count has improved to 2.2.  We will transition her home with outpatient recommendations as stated above.  She will also need to follow-up outpatient dermatology due to diffuse maculopapular rash likely from drug reaction which appears to be improving. ?  ?  ?Assessment & Plan: ? Principal Problem: ?  Neutropenic  fever ([place]) ?Active Problems: ?  Nausea, vomiting, and diarrhea ?  Cancer of central portion of left female breast ([place]) ?  Depression with anxiety ?  Cutaneous lupus erythematosus ?  C. difficile colitis ?  Hyponatremia ?  Hypokalemia ?  Hypomagnesemia ?  Anemia of chronic disease ?  Rash ?  ?  ?Febrile neutropenia ?- WBC today is 2.2.  Neutropenia has resolved, fever has subsided. ?  ?C. difficile colitis. ?-Diarrhea improved significantly.  Immunosuppressed.  Continue PO vanc, complete 10 day course.  Last day would be/5/23. ?  ?Left breast cancer ?-Status post radical mastectomy and chemoradiation; currently on chemotherapy.  Follows up with oncology/Dr. [name].  Outpatient follow-up to be arranged by their service. ?  ?Diffuse maculopapular rash ?-Possibly drug-related, advised patient to take Benadryl/Zyrtec at home along with Pepcid.  She does have a dermatologist who she states she can follow-up with upon discharge as well. ?  ?Depression with anxiety ?-Continue home regimen-Zyprexa, Zoloft ?  ?Cutaneous lupus erythematosus ?-Home regimen (Plaquenil)  ?  ?Anemia of chronic disease ?-Hemoglobin stable ?  ?Hyponatremia ?-Encourage oral intake ?  ?Obesity ?-Outpatient follow-up ?  ?  ?Body mass index is 38.07 kg/m?. ? ?  ? ? ? ?Discharge Diagnoses:  ?Principal Problem: ?  Neutropenic fever ([place]) ?Active Problems: ?  Nausea, vomiting, and diarrhea ?  Cancer of central portion of left female breast ([place]) ?  Depression with anxiety ?  Cutaneous lupus erythematosus ?  C. difficile colitis ?  Hyponatremia ?  Hypokalemia ?  Hypomagnesemia ?  Anemia of chronic disease ?  Rash ? ? ? ? ? ?Consultations: ?Oncology ? ?Subjective: ?Feels great no complaints ?Rash persist but overall slowly improving.  Denies any drainage, itching at this time ? ?  Discharge Exam: ?Vitals:  ? 10/03/21 2242 10/04/21 0558  ?BP: (!) 97/55 (!) 104/59  ?Pulse: 98 100  ?Resp: 18 20  ?Temp: 97.9 ?F (36.6 ?C) 98.2 ?F (36.8 ?C)  ?SpO2: 97% 97%   ? ?Vitals:  ? 10/03/21 1847 10/03/21 2045 10/03/21 2242 10/04/21 8309  ?BP: 120/73 105/63 (!) 97/55 (!) 104/59  ?Pulse: (!) 116 (!) 105 98 100  ?Resp: '16 18 18 20  '$ ?Temp: 98.4 ?F (36.9 ?C) 98.3 ?F (36.8 ?C) 97.9 ?F (36.6 ?C) 98.2 ?F (36.8 ?C)  ?TempSrc: Oral Oral Oral Oral  ?SpO2: 96% 94% 97% 97%  ?Weight:      ?Height:      ? ? ?General: Pt is alert, awake, not in acute distress ?Cardiovascular: RRR, S1/S2 +, no rubs, no gallops ?Respiratory: CTA bilaterally, no wheezing, no rhonchi ?Abdominal: Soft, NT, ND, bowel sounds + ?Extremities: no edema, no cyanosis ?Mild diffuse maculopapular rash on her torso and upper thigh ? ?Discharge Instructions ? ? ?Allergies as of 10/04/2021   ? ?   Reactions  ? Medroxyprogesterone Other (See Comments)  ? Heavy Bleeding ?Heavy Bleeding  ? Xeloda [capecitabine] Rash  ? ?  ? ?  ?Medication List  ?  ? ?TAKE these medications   ? ?ADVIL PM PO ?Take 1 capsule by mouth at bedtime as needed (sleep). ?  ?ALPRAZolam 1 MG tablet ?Commonly known as: Duanne Moron ?Take 1 mg by mouth 3 (three) times daily as needed for anxiety. ?  ?baclofen 10 MG tablet ?Commonly known as: LIORESAL ?TAKE 1 TABLET BY MOUTH TWICE A DAY AS NEEDED FOR MUSCLE SPASMS ?What changed: See the new instructions. ?  ?dexamethasone 4 MG tablet ?Commonly known as: DECADRON ?Take 2 tablets (8 mg) daily for 3 days after chemotherapy. Take with food. ?  ?diphenhydrAMINE 25 mg capsule ?Commonly known as: BENADRYL ?Take 25 mg by mouth every 6 (six) hours as needed for allergies. ?  ?diphenoxylate-atropine 2.5-0.025 MG tablet ?Commonly known as: LOMOTIL ?Take 1-2 tablets by mouth 4 (four) times daily as needed for diarrhea or loose stools. ?  ?eucerin lotion ?Apply 1 application. topically as needed for dry skin (itchyness (red cap)). ?  ?fluconazole 100 MG tablet ?Commonly known as: DIFLUCAN ?Take 1 tablet (100 mg total) by mouth daily for 1 day. ?  ?hydroxychloroquine 200 MG tablet ?Commonly known as: PLAQUENIL ?Take 200 mg by mouth 2  (two) times daily. ?  ?ibuprofen 200 MG tablet ?Commonly known as: ADVIL ?Take 800 mg by mouth every 8 (eight) hours as needed for moderate pain. ?  ?lidocaine-prilocaine cream ?Commonly known as: EMLA ?APPLY TO AFFECTED AREA ONCE AS NEEDED (PORT ACCESS) ?What changed: See the new instructions. ?  ?sertraline 50 MG tablet ?Commonly known as: ZOLOFT ?Take 50 mg by mouth every morning. ?  ?vancomycin 125 MG capsule ?Commonly known as: VANCOCIN ?Take 1 capsule (125 mg total) by mouth every 6 (six) hours for 6 days. ?  ?ZzzQuil 50 MG/30ML Liqd ?Generic drug: diphenhydrAMINE HCl ?Take 30 mLs by mouth at bedtime as needed (sleep). ?  ? ?  ? ? Follow-up Information   ? ? Slatosky, Marshall Cork., MD Follow up in 1 week(s).   ?Specialty: Family Medicine ?Contact information: ?[address] ?[place] [address] ?[phone] ? ? ?  ?  ? ? Morrison Old, NP .   ?Specialty: Nurse Practitioner ?Contact information: ?[address] ?[place] [address] ?[phone] ? ? ?  ?  ? ?  ?  ? ?  ? ?  Allergies  ?Allergen Reactions  ? Medroxyprogesterone Other (See Comments)  ?  Heavy Bleeding ?Heavy Bleeding ?  ? Xeloda [Capecitabine] Rash  ? ? ?You were cared for by a hospitalist during your hospital stay. If you have any questions about your discharge medications or the care you received while you were in the hospital after you are discharged, you can call the unit and asked to speak with the hospitalist on call if the hospitalist that took care of you is not available. Once you are discharged, your primary care physician will handle any further medical issues. Please note that no refills for any discharge medications will be authorized once you are discharged, as it is imperative that you return to your primary care physician (or establish a relationship with a primary care physician if you do not have one) for your aftercare needs so that they can reassess your need for medications and monitor your lab  values. ? ? ?Procedures/Studies: ?DG Chest 2 View ? ?Result Date: 09/13/2021 ?CLINICAL DATA:  Shortness of breath EXAM: CHEST - 2 VIEW COMPARISON:  None. FINDINGS: Patchy opacities in the upper left lung. No pleural effusion or pneumothorax.

## 2021-10-04 NOTE — Progress Notes (Signed)
HEMATOLOGY-ONCOLOGY PROGRESS NOTE ? ?ASSESSMENT AND PLAN: ?1.  Neutropenia and anemia secondary to chemotherapy ?2.  Breast cancer ?3.  Diarrhea secondary to C. difficile colitis ?4.  Diffuse maculopapular rash ?5.  Depression with anxiety ?6.  Cutaneous lupus erythematous ?7.  Hypokalemia ? ?-Labs reviewed.  WBC has improved to 2.2 and hemoglobin is stable at 10.3.  Weight is remained normal at 299,000.  Previously received G-CSF following chemotherapy.  We will continue to monitor CBC. ?-Diarrhea has resolved.  Management per hospitalist. ?-Continues to have a rash.  May be related to medication versus CHG bath.  Continue diphenhydramine for itching. ?-Replete potassium per hospitalist. ?-The patient will be discharging to home later today after IV potassium.  We will arrange for outpatient follow-up at the cancer center.  She will be called with the date and time of her appointments. ? ?Mikey Bussing, DNP, AGPCNP-BC, AOCNP ? ?SUBJECTIVE: Feeling better today.  No diarrhea.  Rash is about the same but less pruritic.  Developed a nosebleed this morning.  She thinks the air is dry. ? ?Oncology History Overview Note  ?Cancer Staging ?Cancer of central portion of left female breast ([place]) ?Staging form: Breast, AJCC 8th Edition ?- Clinical stage from 03/08/2020: Stage IIIB (cT2, cN1, cM0, G3, ER-, PR-, HER2-) - Signed by Truitt Merle, MD on 03/10/2020 ?Stage prefix: Initial diagnosis ?Histologic grading system: 3 grade system ?- Pathologic stage from 09/20/2020: No Stage Recommended (ypT0, pN2a, cM0, G3, ER-, PR-, HER2-) - Signed by Gardenia Phlegm, NP on 10/04/2020 ?Stage prefix: Post-therapy ?Histologic grading system: 3 grade system ? ?  ?Cancer of central portion of left female breast ([place])  ?02/23/2020 Mammogram  ? Diagnostic Mammogram 02/23/20  ?IMPRESSION ?The 2x1x2.6cm irregular mass in teh left breast at 12:00 posiiton middle depth is highly suspicious of malignancy. An [place] is recommended for further evaluation  and biopsy planning purposes. ?  ?02/23/2020 Breast US  ? [name] 02/23/20  ?IMPRESSION ?2 adjacent spiculated masses in the left brast at 12:00 position 3 cm from the nipple (2.1x0.9x1.1cm and 1.1x1.4x0.5cm) is suggestive of malignancy.  ? ?Multiple abnormal left subpectoral and left axillary nodes measuring 3.3x2.1 cm concerning for metastatic adenopathy.  ? ? ?Left breast skin thickening and edema may be secondary congestive edema due to extensive axillary adenopathy.  ?  ?03/08/2020 Initial Biopsy  ? Diagnosis ?1.Breast, left, needle core biopsy, 12:00 position, 3cmfn ?-INVASIVE DUCTAL CARCINOMA ?-SEE COMMENT ? ?2. Lymph node, needle/core biopsy, left axilla ?-METASTATIC CARCINOMA INVOLVING A LYMPH NODE  ?-LYMPHOVASCULAR SPACE INVASION PRESENT  ? ?Microscopic Comment  ?1.Based on the biopsy the carcinoma appears Nottingham Grade 3 or 3 and measures 1 cm in the greatest linear extent.  ?  ?03/08/2020 Receptors her2  ? ER- Negative 0% ?PR - Negative 0% ?HER2 - Negative  ?KI 67 - 80% ? ?  ?03/08/2020 Cancer Staging  ? Staging form: Breast, AJCC 8th Edition ?- Clinical stage from 03/08/2020: Stage IIIB (cT2, cN1, cM0, G3, ER-, PR-, HER2-) - Signed by Truitt Merle, MD on 03/10/2020 ?  ?03/10/2020 Initial Diagnosis  ? Cancer of central portion of left female breast [facility]) ?  ?03/16/2020 Breast MRI  ? IMPRESSION: ?1. 8.1 x 7.8 x 6.6 cm biopsy proven invasive ductal carcinoma in the ?central right breast, involving 3 quadrants. ?2. 3.0 x 1.7 x 1.1 cm satellite mass more inferiorly in lower inner ?quadrant of the left breast, compatible with additional malignancy. ?3. Metastatic level 1 and level 2 left axillary lymph nodes. ?4. No  evidence of malignancy on the right. ?  ?03/17/2020 Imaging  ? IMPRESSION: ?CT CAP w contrast  ?1. Diffuse skin thickening in the left breast with left axillary and ?subpectoral lymphadenopathy, as well as a mildly enlarged left ?supraclavicular lymph node, which likely represents  metastatic ?lymphadenopathy. No other definite extra nodal metastatic disease ?noted elsewhere in the chest, abdomen or pelvis. ?2. Large mass in the central anatomic pelvis which is of uncertain ?origin, potentially a large exophytic fibroid or a large solid mass ?arising from the right ovary. Further evaluation with pelvic ?ultrasound is strongly recommended. ?  ?03/21/2020 Imaging  ? Bone Scan  ?IMPRESSION: ?Apparent arthropathy at L5. No bony metastatic disease is ?demonstrable on this study. Scattered foci of abnormal uptake in a ?pelvic mass is of uncertain etiology given absence of calcification ?in this mass by CT. This mass compresses the urinary bladder. It is ?possible that some of the increased uptake in this area actually ?represents physiologic uptake within the bladder. ?  ?Kidneys noted in flank positions bilaterally. ?  ?  ?03/22/2020 - 08/16/2020 Neo-Adjuvant Chemotherapy  ? Neoadjuvant Adriamycin and Cytoxan q2weeks for 4 cycles starting 03/22/20-05/03/20 followed by weekly Taxol and Carboplatin for 12 weeks starting 05/17/20-08/16/20 ?  ?03/24/2020 Imaging  ? US Pelvis  ?IMPRESSION: ?1. Large pedunculated lesion directly contiguous with the uterine ?most suggestive of a large subserosal fibroid which measures up to ?8.2 cm. ?2. Additional 1 cm probable intramural fibroid in the right anterior ?uterine body. ?3. No other acute or worrisome pelvic abnormality. ?  ?03/29/2020 Genetic Testing  ? Negative genetic testing on the common hereditary cancer panel.  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  The Common Hereditary Gene Panel offered by [facility] includes sequencing and/or deletion duplication testing of the following 47 genes: APC, ATM, AXIN2, BARD1, BMPR1A, BRCA1, BRCA2, BRIP1, CDH1, CDK4, CDKN2A (p14ARF), CDKN2A (p16INK4a), CHEK2, CTNNA1, DICER1, EPCAM (Deletion/duplication testing only), GREM1 (promoter region deletion/duplication testing only), KIT, MEN1, MLH1, MSH2, MSH3, MSH6, MUTYH, NBN, NF1, NHTL1,  PALB2, PDGFRA, PMS2, POLD1, POLE, PTEN, RAD50, RAD51C, RAD51D, SDHB, SDHC, SDHD, SMAD4, SMARCA4. STK11, TP53, TSC1, TSC2, and VHL.  The following genes were evaluated for sequence changes only: SDHA and HOXB13 c.251G>A variant only. The report date is 03/29/2020 ?  ?04/28/2020 - 10/27/2020 Antibody Plan  ? Added Keytruda q3weeks starting 04/28/20 to complete 1 year of treatment. Held since 10/27/20 due to skin rash and body aches.  ?  ?08/17/2020 Breast MRI  ? IMPRESSION: ?1. The biopsy proven malignancy in the left breast and the adjacent ?satellite lesion have resolved in the interval. No abnormalities in ?these locations today. ?2. No MRI evidence of malignancy in the right breast. ?3. No adenopathy identified today ?  ?09/20/2020 Surgery  ? LEFT MODIFIED RADICAL MASTECTOMY by Dr Donne Hazel ?  ?09/20/2020 Pathology Results  ? FINAL MICROSCOPIC DIAGNOSIS:  ? ?A. BREAST, LEFT, MODIFIED RADICAL MASTECTOMY:  ?- No residual invasive carcinoma in breast status post neoadjuvant  ?treatment.  ?- Metastatic carcinoma in (2) of (14) lymph nodes.  ?- Biopsy sites (one in breast, one in one of the positive lymph nodes).  ?- See oncology table.  ? ?B. ADDITIONAL AXILLARY CONTENTS, LEFT, DISSECTION:  ?- Metastatic carcinoma in (5) of (5) lymph nodes.  ?  ?09/20/2020 Cancer Staging  ? Staging form: Breast, AJCC 8th Edition ?- Pathologic stage from 09/20/2020: No Stage Recommended (ypT0, pN2a, cM0, G3, ER-, PR-, HER2-) - Signed by Gardenia Phlegm, NP on 10/04/2020 ?Stage prefix: Post-therapy ?Histologic grading system:  3 grade system ?  ?10/30/2020 - 12/11/2020 Radiation Therapy  ? Adjuvant Radiation with Dr Sondra Come starting 10/30/20 ?  ?10/30/2020 -  Chemotherapy  ? Adjuvant Xeloda starting 10/30/20 at 1555m BID M-F while on RT. Held since 11/20/2020 due to recurrent skin rash ?  ?01/18/2021 - 03/17/2021 Chemotherapy  ? Patient is on Treatment Plan : COLORECTAL 5FU / Leucovorin Modified [place] q14d x 12 cycles  ?   ?03/28/2021 Imaging   ? CT CAP ? ?IMPRESSION: ?1. Status post left modified radical mastectomy and left axillary ?lymph node dissection, with no definitive findings to suggest ?metastatic disease in the chest, abdomen or pelvis on today's ?examina

## 2021-10-05 MED FILL — Fosaprepitant Dimeglumine For IV Infusion 150 MG (Base Eq): INTRAVENOUS | Qty: 5 | Status: AC

## 2021-10-05 MED FILL — Dexamethasone Sodium Phosphate Inj 100 MG/10ML: INTRAMUSCULAR | Qty: 1 | Status: AC

## 2021-10-08 ENCOUNTER — Other Ambulatory Visit: Payer: Self-pay

## 2021-10-08 ENCOUNTER — Inpatient Hospital Stay: Payer: Medicaid Other

## 2021-10-08 ENCOUNTER — Inpatient Hospital Stay: Payer: Medicaid Other | Admitting: Nurse Practitioner

## 2021-10-08 ENCOUNTER — Inpatient Hospital Stay: Payer: Medicaid Other | Attending: Hematology

## 2021-10-08 ENCOUNTER — Encounter: Payer: Self-pay | Admitting: Hematology

## 2021-10-08 DIAGNOSIS — Z5111 Encounter for antineoplastic chemotherapy: Secondary | ICD-10-CM | POA: Insufficient documentation

## 2021-10-08 DIAGNOSIS — K760 Fatty (change of) liver, not elsewhere classified: Secondary | ICD-10-CM | POA: Insufficient documentation

## 2021-10-08 DIAGNOSIS — F4310 Post-traumatic stress disorder, unspecified: Secondary | ICD-10-CM | POA: Insufficient documentation

## 2021-10-08 DIAGNOSIS — Z7952 Long term (current) use of systemic steroids: Secondary | ICD-10-CM | POA: Insufficient documentation

## 2021-10-08 DIAGNOSIS — U071 COVID-19: Secondary | ICD-10-CM | POA: Diagnosis not present

## 2021-10-08 DIAGNOSIS — Z21 Asymptomatic human immunodeficiency virus [HIV] infection status: Secondary | ICD-10-CM | POA: Diagnosis not present

## 2021-10-08 DIAGNOSIS — L930 Discoid lupus erythematosus: Secondary | ICD-10-CM | POA: Insufficient documentation

## 2021-10-08 DIAGNOSIS — K219 Gastro-esophageal reflux disease without esophagitis: Secondary | ICD-10-CM | POA: Insufficient documentation

## 2021-10-08 DIAGNOSIS — C50112 Malignant neoplasm of central portion of left female breast: Secondary | ICD-10-CM

## 2021-10-08 DIAGNOSIS — Z791 Long term (current) use of non-steroidal anti-inflammatories (NSAID): Secondary | ICD-10-CM | POA: Insufficient documentation

## 2021-10-08 DIAGNOSIS — A0472 Enterocolitis due to Clostridium difficile, not specified as recurrent: Secondary | ICD-10-CM | POA: Insufficient documentation

## 2021-10-08 DIAGNOSIS — Z923 Personal history of irradiation: Secondary | ICD-10-CM | POA: Diagnosis not present

## 2021-10-08 DIAGNOSIS — Z79899 Other long term (current) drug therapy: Secondary | ICD-10-CM | POA: Diagnosis not present

## 2021-10-08 DIAGNOSIS — C773 Secondary and unspecified malignant neoplasm of axilla and upper limb lymph nodes: Secondary | ICD-10-CM | POA: Diagnosis not present

## 2021-10-08 DIAGNOSIS — Z95828 Presence of other vascular implants and grafts: Secondary | ICD-10-CM

## 2021-10-08 LAB — CBC WITH DIFFERENTIAL/PLATELET
Abs Immature Granulocytes: 0.29 10*3/uL — ABNORMAL HIGH (ref 0.00–0.07)
Basophils Absolute: 0.1 10*3/uL (ref 0.0–0.1)
Basophils Relative: 1 %
Eosinophils Absolute: 0.1 10*3/uL (ref 0.0–0.5)
Eosinophils Relative: 1 %
HCT: 31.2 % — ABNORMAL LOW (ref 36.0–46.0)
Hemoglobin: 9.9 g/dL — ABNORMAL LOW (ref 12.0–15.0)
Immature Granulocytes: 4 %
Lymphocytes Relative: 19 %
Lymphs Abs: 1.5 10*3/uL (ref 0.7–4.0)
MCH: 28 pg (ref 26.0–34.0)
MCHC: 31.7 g/dL (ref 30.0–36.0)
MCV: 88.4 fL (ref 80.0–100.0)
Monocytes Absolute: 1 10*3/uL (ref 0.1–1.0)
Monocytes Relative: 14 %
Neutro Abs: 4.6 10*3/uL (ref 1.7–7.7)
Neutrophils Relative %: 61 %
Platelets: 461 10*3/uL — ABNORMAL HIGH (ref 150–400)
RBC: 3.53 MIL/uL — ABNORMAL LOW (ref 3.87–5.11)
RDW: 17.2 % — ABNORMAL HIGH (ref 11.5–15.5)
WBC: 7.6 10*3/uL (ref 4.0–10.5)
nRBC: 0.4 % — ABNORMAL HIGH (ref 0.0–0.2)

## 2021-10-08 LAB — COMPREHENSIVE METABOLIC PANEL
ALT: 92 U/L — ABNORMAL HIGH (ref 0–44)
AST: 49 U/L — ABNORMAL HIGH (ref 15–41)
Albumin: 3.3 g/dL — ABNORMAL LOW (ref 3.5–5.0)
Alkaline Phosphatase: 125 U/L (ref 38–126)
Anion gap: 10 (ref 5–15)
BUN: 15 mg/dL (ref 6–20)
CO2: 23 mmol/L (ref 22–32)
Calcium: 8.7 mg/dL — ABNORMAL LOW (ref 8.9–10.3)
Chloride: 106 mmol/L (ref 98–111)
Creatinine, Ser: 0.65 mg/dL (ref 0.44–1.00)
GFR, Estimated: >60 mL/min (ref >60)
Glucose, Bld: 79 mg/dL (ref 70–99)
Potassium: 4.3 mmol/L (ref 3.5–5.1)
Sodium: 139 mmol/L (ref 135–145)
Total Bilirubin: 0.2 mg/dL — ABNORMAL LOW (ref 0.3–1.2)
Total Protein: 6.6 g/dL (ref 6.5–8.1)

## 2021-10-08 MED ORDER — SODIUM CHLORIDE 0.9% FLUSH
10.0000 mL | Freq: Once | INTRAVENOUS | Status: AC
  Administered 2021-10-08: 10 mL

## 2021-10-08 MED ORDER — HEPARIN SOD (PORK) LOCK FLUSH 100 UNIT/ML IV SOLN
500.0000 [IU] | Freq: Once | INTRAVENOUS | Status: AC
  Administered 2021-10-08: 500 [IU]

## 2021-10-08 NOTE — Progress Notes (Signed)
No chemo today per Dr. [name]. Patient does not feel like she needs IVF's today.  ?

## 2021-10-09 LAB — CBC WITH DIFFERENTIAL/PLATELET
Abs Immature Granulocytes: 0 10*3/uL (ref 0.00–0.07)
Basophils Absolute: 0 10*3/uL (ref 0.0–0.1)
Basophils Relative: 3 %
Eosinophils Absolute: 0 10*3/uL (ref 0.0–0.5)
Eosinophils Relative: 5 %
HCT: 31 % — ABNORMAL LOW (ref 36.0–46.0)
Hemoglobin: 10 g/dL — ABNORMAL LOW (ref 12.0–15.0)
Immature Granulocytes: 0 %
Lymphocytes Relative: 53 %
Lymphs Abs: 0.2 10*3/uL — ABNORMAL LOW (ref 0.7–4.0)
MCH: 28.7 pg (ref 26.0–34.0)
MCHC: 32.3 g/dL (ref 30.0–36.0)
MCV: 88.8 fL (ref 80.0–100.0)
Monocytes Absolute: 0.1 10*3/uL (ref 0.1–1.0)
Monocytes Relative: 13 %
Neutro Abs: 0.1 10*3/uL — CL (ref 1.7–7.7)
Neutrophils Relative %: 26 %
Platelets: 164 10*3/uL (ref 150–400)
RBC: 3.49 MIL/uL — ABNORMAL LOW (ref 3.87–5.11)
RDW: 15.2 % (ref 11.5–15.5)
WBC: 0.4 10*3/uL — CL (ref 4.0–10.5)
nRBC: 0 % (ref 0.0–0.2)

## 2021-10-12 ENCOUNTER — Other Ambulatory Visit: Payer: Medicaid Other

## 2021-10-12 ENCOUNTER — Other Ambulatory Visit: Payer: Self-pay | Admitting: Hematology

## 2021-10-15 ENCOUNTER — Inpatient Hospital Stay: Payer: Medicaid Other

## 2021-10-15 ENCOUNTER — Encounter: Payer: Self-pay | Admitting: Hematology

## 2021-10-15 ENCOUNTER — Inpatient Hospital Stay: Payer: Medicaid Other | Admitting: Hematology

## 2021-10-15 ENCOUNTER — Other Ambulatory Visit: Payer: Self-pay

## 2021-10-15 VITALS — BP 120/79 | HR 104 | Temp 97.8°F | Resp 18 | Ht 64.0 in | Wt 226.8 lb

## 2021-10-15 DIAGNOSIS — Z171 Estrogen receptor negative status [ER-]: Secondary | ICD-10-CM

## 2021-10-15 DIAGNOSIS — Z5111 Encounter for antineoplastic chemotherapy: Secondary | ICD-10-CM | POA: Diagnosis not present

## 2021-10-15 DIAGNOSIS — C50112 Malignant neoplasm of central portion of left female breast: Secondary | ICD-10-CM | POA: Diagnosis not present

## 2021-10-15 DIAGNOSIS — Z95828 Presence of other vascular implants and grafts: Secondary | ICD-10-CM

## 2021-10-15 LAB — COMPREHENSIVE METABOLIC PANEL
ALT: 46 U/L — ABNORMAL HIGH (ref 0–44)
AST: 20 U/L (ref 15–41)
Albumin: 3.5 g/dL (ref 3.5–5.0)
Alkaline Phosphatase: 128 U/L — ABNORMAL HIGH (ref 38–126)
Anion gap: 6 (ref 5–15)
BUN: 14 mg/dL (ref 6–20)
CO2: 27 mmol/L (ref 22–32)
Calcium: 8.8 mg/dL — ABNORMAL LOW (ref 8.9–10.3)
Chloride: 105 mmol/L (ref 98–111)
Creatinine, Ser: 0.67 mg/dL (ref 0.44–1.00)
GFR, Estimated: >60 mL/min (ref >60)
Glucose, Bld: 106 mg/dL — ABNORMAL HIGH (ref 70–99)
Potassium: 4.1 mmol/L (ref 3.5–5.1)
Sodium: 138 mmol/L (ref 135–145)
Total Bilirubin: 0.2 mg/dL — ABNORMAL LOW (ref 0.3–1.2)
Total Protein: 6.6 g/dL (ref 6.5–8.1)

## 2021-10-15 LAB — CBC WITH DIFFERENTIAL/PLATELET
Abs Immature Granulocytes: 0.05 10*3/uL (ref 0.00–0.07)
Basophils Absolute: 0.1 10*3/uL (ref 0.0–0.1)
Basophils Relative: 1 %
Eosinophils Absolute: 0.1 10*3/uL (ref 0.0–0.5)
Eosinophils Relative: 2 %
HCT: 31.5 % — ABNORMAL LOW (ref 36.0–46.0)
Hemoglobin: 9.9 g/dL — ABNORMAL LOW (ref 12.0–15.0)
Immature Granulocytes: 1 %
Lymphocytes Relative: 30 %
Lymphs Abs: 1.5 10*3/uL (ref 0.7–4.0)
MCH: 28.2 pg (ref 26.0–34.0)
MCHC: 31.4 g/dL (ref 30.0–36.0)
MCV: 89.7 fL (ref 80.0–100.0)
Monocytes Absolute: 0.5 10*3/uL (ref 0.1–1.0)
Monocytes Relative: 11 %
Neutro Abs: 2.8 10*3/uL (ref 1.7–7.7)
Neutrophils Relative %: 55 %
Platelets: 363 10*3/uL (ref 150–400)
RBC: 3.51 MIL/uL — ABNORMAL LOW (ref 3.87–5.11)
RDW: 17.6 % — ABNORMAL HIGH (ref 11.5–15.5)
Smear Review: NORMAL
WBC: 4.9 10*3/uL (ref 4.0–10.5)
nRBC: 0 % (ref 0.0–0.2)

## 2021-10-15 MED ORDER — SODIUM CHLORIDE 0.9% FLUSH
10.0000 mL | Freq: Once | INTRAVENOUS | Status: AC
  Administered 2021-10-15: 10 mL

## 2021-10-15 MED ORDER — LIDOCAINE-PRILOCAINE 2.5-2.5 % EX CREA: 1.0000 "application " | TOPICAL_CREAM | CUTANEOUS | 0 refills | Status: DC | PRN

## 2021-10-15 MED ORDER — ALTEPLASE 2 MG IJ SOLR
2.0000 mg | Freq: Once | INTRAMUSCULAR | Status: AC
  Administered 2021-10-15: 2 mg
  Filled 2021-10-15: qty 2

## 2021-10-15 MED ORDER — HEPARIN SOD (PORK) LOCK FLUSH 100 UNIT/ML IV SOLN
500.0000 [IU] | Freq: Once | INTRAVENOUS | Status: AC
  Administered 2021-10-15: 500 [IU]

## 2021-10-15 MED ORDER — BACLOFEN 10 MG PO TABS: 10.0000 mg | ORAL_TABLET | Freq: Two times a day (BID) | ORAL | 0 refills | Status: DC | PRN

## 2021-10-15 NOTE — Progress Notes (Signed)
Nutrition ? ?Patient on schedule to see during infusion.  Infusion cancelled for today.  RD will try and see during another infusion ? ?Caitlyn Hendricks, RD, LDN ?Registered Dietitian ?[phone] ? ?

## 2021-10-15 NOTE — Progress Notes (Signed)
?[facility]   ?Telephone:([phone]) [phone] [phone]   ?Clinic Follow up Note  ? ?Patient Care Team: ?[name], MD as PCP - General (Family Medicine) ?Morrison Old, NP as PCP - Dermatology (Nurse Practitioner) ?Mauro Kaufmann, RN as Oncology Nurse Navigator ?Rockwell Germany, RN as Oncology Nurse Navigator ?[name], MD as Consulting Physician (General Surgery) ?Truitt Merle, MD as Consulting Physician (Hematology) ?Gery Pray, MD as Consulting Physician (Radiation Oncology) ?Debby Bud, [place] (Physician Assistant) ? ?Date of Service:  10/15/2021 ? ?CHIEF COMPLAINT: f/u of left breast cancer ? ?CURRENT THERAPY:  ?Caitlyn Hendricks, started 09/17/21 ? ?ASSESSMENT & PLAN:  ?Caitlyn Hendricks is a 41 y.o. pre-menopausal female with  ? ?1. 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 Biopsy on 03/08/20 revealed invasive ductal carcinoma, triple negative, metastatic to her left axillary LN. CT CAP/Bone scan negative for distant mets.  ?-She completed neoadjuvant chemo with ddAC q2weeks for 4 cycles 03/22/20-05/03/20. Followed by weekly carbo/taxol for 12 weeks 05/17/20-08/16/20 before proceeding with surgery.  ?-she started Ballard Russell for 1 year treatment on 04/28/20, but stopped in 10/2020 due to skin rash and arthralgia.   ?-She underwent left mastectomy with Dr Donne Hazel on 09/20/20. Surgical path shows No residual invasive carcinoma in breast s/p neoadjuvant treatment, but 7/19 positive LN.  ?-she began concurrent chemoRT with oral Xeloda on 10/30/20 and completed radiation on 12/11/20. She developed a rash from Xeloda, and this was held from 5/16-6/27/22. When this was restarted, she again developed a rash and joint pain. It was switched to 5-FU on 01/18/21 for 4 cycles. During that time, she was  diagnosed with Lupus and put on medication. With the rash under control, she switched back to Xeloda and completed 6 months of treatment in 05/2021.  ?--she developed skin erythema with nodularity to left chest wall. Punch biopsy of the area on 08/28/21 revealed infiltrating carcinoma, consistent with breast origin. This was found to be Her2-. ?-PET scan on 09/13/21 showed: hypermetabolic left level IV, left supraclavicular, and right axillary adenopathy; new nodularity in apical segment LUL and in subpleural portions of LUL and LLL, concerning for infection, malignancy felt to be less likely.  ?-she started [employer] Caitlyn Hendricks) on 09/17/21. She tolerated well overall, left chest discomfort has improved ?-she developed diarrhea about a week after C1 and was hospitalized with C-diff on 09/28/21. She has recovered well with some residual loose stool, as well as itching and rash from one of the treatments. ?-she is planning several trips for her birthday, so we will hold treatment today. She agreed to return on 4/25 and 5/2 for her next treatments. ? ?2. C-diff colitis  ?-developed diarrhea around 09/24/21 ?-hospitalized 3/24-3/30/23 ?-developed rash and itching from IV treatment in the hospital ?-diarrhea improved to loose/soft stool. ?  ?3. Cutaneous Lupus ?-She had several episodes of rash since she started chemo treatment; skin biopsy showed a drug-related dermatitis. The rash persisted on Keytruda, Xeloda, and 5-FU. ?-she underwent lab work with her dermatologist on 02/27/21. Results revealed cutaneous Lupus. She is now on medication for this. ?-since being on the medication for Lupus, her rash has greatly improved. ?  ?4. Anxiety, Social support, PTSD ?-She has a history of anxiety and has been on Xanax 16m TID and Celexa 413m ?-she is now seeing therapist JeStormy Card  and psychiatrist Debby Bud, [place].  Her anxiety level has gotten much worse since recent metastatic cancer recurrence ?-She has very good  social support from mother and boyfriend. 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 ?-she enrolled in [place] 2023 ?-she reports her psych medications have been re-adjusted. ?  ?4. Genetic Testing negative for pathogenetic mutations with VUS of POLE gene ?  ?5. COVID (+) on 08/02/20 with symptoms of Hoarseness and ribcage pain. She has recovered quickly and completely   ?  ?  ?PLAN: ?-hold chemo today  ?-I refilled baclofen and emla cream ?-lab, flush, f/u, and Trodelvy on 4/25 and 5/2 ? ? ?No problem-specific Assessment & Plan notes found for this encounter. ? ? 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 ?  ?Cancer of central portion of left female breast ([place])  ?02/23/2020 Mammogram  ? Diagnostic Mammogram 02/23/20  ?IMPRESSION ?The 2x1x2.6cm irregular mass in teh left breast at 12:00 posiiton middle depth is highly suspicious of malignancy. An [place] is recommended for further evaluation and biopsy planning purposes. ?  ?02/23/2020 Breast US  ? [name] 02/23/20  ?IMPRESSION ?2 adjacent spiculated masses in the left brast at 12:00 position 3 cm from the nipple (2.1x0.9x1.1cm and 1.1x1.4x0.5cm) is suggestive of malignancy.  ? ?Multiple abnormal left subpectoral and left axillary nodes measuring 3.3x2.1 cm concerning for metastatic adenopathy.  ? ? ?Left breast skin thickening and edema may be secondary congestive edema due to extensive  axillary adenopathy.  ?  ?03/08/2020 Initial Biopsy  ? Diagnosis ?1.Breast, left, needle core biopsy, 12:00 position, 3cmfn ?-INVASIVE DUCTAL CARCINOMA ?-SEE COMMENT ? ?2. Lymph node, needle/core biopsy, left axilla ?-METASTATIC CARCINOMA INVOLVING A LYMPH NODE  ?-LYMPHOVASCULAR SPACE INVASION PRESENT  ? ?Microscopic Comment  ?1.Based on the biopsy the carcinoma appears Nottingham Grade 3 or 3 and measures 1 cm in the greatest linear extent.  ?  ?03/08/2020 Receptors her2  ? ER- Negative 0% ?PR - Negative 0% ?HER2 - Negative  ?KI 67 - 80% ? ?  ?03/08/2020 Cancer Staging  ? Staging form: Breast, AJCC 8th Edition ?- Clinical stage from 03/08/2020: Stage IIIB (cT2, cN1, cM0, G3, ER-, PR-, HER2-) - Signed by Truitt Merle, MD on 03/10/2020 ? ?  ?03/10/2020 Initial Diagnosis  ? Cancer of central portion of left female breast [facility]) ?  ?03/16/2020 Breast MRI  ? IMPRESSION: ?1. 8.1 x 7.8 x 6.6 cm biopsy proven invasive ductal carcinoma in the ?central right breast, involving 3 quadrants. ?2. 3.0 x 1.7 x 1.1 cm satellite mass more inferiorly in lower inner ?quadrant of the left breast, compatible with additional malignancy. ?3. Metastatic level 1 and level 2 left axillary lymph nodes. ?4. No evidence of malignancy on the right. ?  ?03/17/2020 Imaging  ? IMPRESSION: ?CT CAP w contrast  ?1. Diffuse skin thickening in the left breast with left axillary and ?subpectoral lymphadenopathy, as well as a mildly enlarged left ?supraclavicular lymph node, which likely represents metastatic ?lymphadenopathy. No  other definite extra nodal metastatic disease ?noted elsewhere in the chest, abdomen or pelvis. ?2. Large mass in the central anatomic pelvis which is of uncertain ?origin, potentially a large exophytic fibroid or a large solid mass ?arising from the right ovary. Further evaluation with pelvic ?ultrasound is strongly recommended. ?  ?03/21/2020 Imaging  ? Bone Scan  ?IMPRESSION: ?Apparent arthropathy at L5. No bony metastatic disease  is ?demonstrable on this study. Scattered foci of abnormal uptake in a ?pelvic mass is of uncertain etiology given absence of calcification ?in this mass by CT. This mass compresses the urinary bladder. It is ?possible that som

## 2021-10-15 NOTE — Addendum Note (Signed)
Addended by: Lynnae Sandhoff on: 10/15/2021 09:44 AM ? ? Modules accepted: Orders ? ?

## 2021-10-16 ENCOUNTER — Telehealth: Payer: Self-pay | Admitting: Hematology

## 2021-10-16 NOTE — Telephone Encounter (Signed)
Scheduled follow-up appointments per 4/10 los. Patient is aware. ?

## 2021-10-25 ENCOUNTER — Other Ambulatory Visit: Payer: Self-pay | Admitting: Hematology

## 2021-10-30 ENCOUNTER — Inpatient Hospital Stay: Payer: Medicaid Other

## 2021-10-30 ENCOUNTER — Encounter: Payer: Self-pay | Admitting: Hematology

## 2021-10-30 ENCOUNTER — Other Ambulatory Visit: Payer: Self-pay

## 2021-10-30 ENCOUNTER — Encounter: Payer: Self-pay | Admitting: General Practice

## 2021-10-30 ENCOUNTER — Inpatient Hospital Stay: Payer: Medicaid Other | Admitting: Hematology

## 2021-10-30 VITALS — BP 137/91 | HR 85 | Temp 98.5°F | Resp 18 | Ht 64.0 in | Wt 225.3 lb

## 2021-10-30 DIAGNOSIS — Z171 Estrogen receptor negative status [ER-]: Secondary | ICD-10-CM

## 2021-10-30 DIAGNOSIS — C50112 Malignant neoplasm of central portion of left female breast: Secondary | ICD-10-CM | POA: Diagnosis not present

## 2021-10-30 DIAGNOSIS — Z95828 Presence of other vascular implants and grafts: Secondary | ICD-10-CM

## 2021-10-30 DIAGNOSIS — Z5111 Encounter for antineoplastic chemotherapy: Secondary | ICD-10-CM | POA: Diagnosis not present

## 2021-10-30 LAB — COMPREHENSIVE METABOLIC PANEL
ALT: 25 U/L (ref 0–44)
AST: 26 U/L (ref 15–41)
Albumin: 3.8 g/dL (ref 3.5–5.0)
Alkaline Phosphatase: 136 U/L — ABNORMAL HIGH (ref 38–126)
Anion gap: 6 (ref 5–15)
BUN: 17 mg/dL (ref 6–20)
CO2: 28 mmol/L (ref 22–32)
Calcium: 9.6 mg/dL (ref 8.9–10.3)
Chloride: 104 mmol/L (ref 98–111)
Creatinine, Ser: 0.66 mg/dL (ref 0.44–1.00)
GFR, Estimated: >60 mL/min (ref >60)
Glucose, Bld: 119 mg/dL — ABNORMAL HIGH (ref 70–99)
Potassium: 3.6 mmol/L (ref 3.5–5.1)
Sodium: 138 mmol/L (ref 135–145)
Total Bilirubin: 0.5 mg/dL (ref 0.3–1.2)
Total Protein: 7.1 g/dL (ref 6.5–8.1)

## 2021-10-30 LAB — CBC WITH DIFFERENTIAL/PLATELET
Abs Immature Granulocytes: 0.01 10*3/uL (ref 0.00–0.07)
Basophils Absolute: 0 10*3/uL (ref 0.0–0.1)
Basophils Relative: 1 %
Eosinophils Absolute: 0.2 10*3/uL (ref 0.0–0.5)
Eosinophils Relative: 4 %
HCT: 35.4 % — ABNORMAL LOW (ref 36.0–46.0)
Hemoglobin: 11.5 g/dL — ABNORMAL LOW (ref 12.0–15.0)
Immature Granulocytes: 0 %
Lymphocytes Relative: 20 %
Lymphs Abs: 0.9 10*3/uL (ref 0.7–4.0)
MCH: 29.2 pg (ref 26.0–34.0)
MCHC: 32.5 g/dL (ref 30.0–36.0)
MCV: 89.8 fL (ref 80.0–100.0)
Monocytes Absolute: 0.6 10*3/uL (ref 0.1–1.0)
Monocytes Relative: 13 %
Neutro Abs: 3 10*3/uL (ref 1.7–7.7)
Neutrophils Relative %: 62 %
Platelets: 382 10*3/uL (ref 150–400)
RBC: 3.94 MIL/uL (ref 3.87–5.11)
RDW: 19.4 % — ABNORMAL HIGH (ref 11.5–15.5)
WBC: 4.8 10*3/uL (ref 4.0–10.5)
nRBC: 0 % (ref 0.0–0.2)

## 2021-10-30 MED ORDER — HEPARIN SOD (PORK) LOCK FLUSH 100 UNIT/ML IV SOLN
500.0000 [IU] | Freq: Once | INTRAVENOUS | Status: AC | PRN
  Administered 2021-10-30: 500 [IU]

## 2021-10-30 MED ORDER — PALONOSETRON HCL INJECTION 0.25 MG/5ML
0.2500 mg | Freq: Once | INTRAVENOUS | Status: AC
  Administered 2021-10-30: 0.25 mg via INTRAVENOUS
  Filled 2021-10-30: qty 5

## 2021-10-30 MED ORDER — ACETAMINOPHEN 325 MG PO TABS
650.0000 mg | ORAL_TABLET | Freq: Once | ORAL | Status: AC
  Administered 2021-10-30: 650 mg via ORAL
  Filled 2021-10-30: qty 2

## 2021-10-30 MED ORDER — SODIUM CHLORIDE 0.9 % IV SOLN
10.0000 mg | Freq: Once | INTRAVENOUS | Status: AC
  Administered 2021-10-30: 10 mg via INTRAVENOUS
  Filled 2021-10-30: qty 10

## 2021-10-30 MED ORDER — PANTOPRAZOLE SODIUM 20 MG PO TBEC: 20.0000 mg | DELAYED_RELEASE_TABLET | Freq: Every day | ORAL | 0 refills | Status: DC

## 2021-10-30 MED ORDER — SODIUM CHLORIDE 0.9 % IV SOLN
9.0000 mg/kg | Freq: Once | INTRAVENOUS | Status: AC
  Administered 2021-10-30: 900 mg via INTRAVENOUS
  Filled 2021-10-30: qty 90

## 2021-10-30 MED ORDER — FAMOTIDINE IN NACL 20-0.9 MG/50ML-% IV SOLN
20.0000 mg | Freq: Once | INTRAVENOUS | Status: AC
  Administered 2021-10-30: 20 mg via INTRAVENOUS
  Filled 2021-10-30: qty 50

## 2021-10-30 MED ORDER — SODIUM CHLORIDE 0.9 % IV SOLN
150.0000 mg | Freq: Once | INTRAVENOUS | Status: AC
  Administered 2021-10-30: 150 mg via INTRAVENOUS
  Filled 2021-10-30: qty 150

## 2021-10-30 MED ORDER — SODIUM CHLORIDE 0.9 % IV SOLN: Freq: Once | INTRAVENOUS | Status: AC

## 2021-10-30 MED ORDER — DIPHENHYDRAMINE HCL 50 MG/ML IJ SOLN
50.0000 mg | Freq: Once | INTRAMUSCULAR | Status: AC
  Administered 2021-10-30: 50 mg via INTRAVENOUS
  Filled 2021-10-30: qty 1

## 2021-10-30 MED ORDER — ATROPINE SULFATE 1 MG/ML IV SOLN
0.5000 mg | Freq: Once | INTRAVENOUS | Status: AC | PRN
  Administered 2021-10-30: 0.5 mg via INTRAVENOUS
  Filled 2021-10-30: qty 1

## 2021-10-30 MED ORDER — SODIUM CHLORIDE 0.9% FLUSH
10.0000 mL | INTRAVENOUS | Status: DC | PRN
  Administered 2021-10-30: 10 mL

## 2021-10-30 MED ORDER — SODIUM CHLORIDE 0.9% FLUSH
10.0000 mL | Freq: Once | INTRAVENOUS | Status: AC
  Administered 2021-10-30: 10 mL

## 2021-10-30 NOTE — Progress Notes (Signed)
[facility] Spiritual Care Note ? 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 Met Aliha in infusion to introduce Spiritual Care as part of her support team. She reports good support from her psychiatrist and therapist, and welcomes Bruni from the oncology and meaning-making side. ? 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 She is looking forward to adopting a new puppy in July. ? ?She notes the helpfulness of a recent vacation, including two weeks of "not thinking about cancer," which gave her a sense of freedom and normalcy; however, Elijah also notes that she is relieved to get back to chemo because it gives her a little more security over going untreated. ? ?Genoveva has my card and knows to reach out whenever needed/desired. We also plan to connect again in infusion. ? ? ?Chaplain Lorrin Jackson, MDiv, [facility] ?Pager [phone] ?Voicemail [phone]  ?

## 2021-10-30 NOTE — Patient Instructions (Signed)
[facility]  Discharge Instructions: ?Thank you for choosing [facility] to provide your oncology and hematology care.  ? ?If you have a lab appointment with the [facility], please go directly to the [facility] and check in at the registration area. ?  ?Wear comfortable clothing and clothing appropriate for easy access to any Portacath or PICC line.  ? ?We strive to give you quality time with your provider. You may need to reschedule your appointment if you arrive late (15 or more minutes).  Arriving late affects you and other patients whose appointments are after yours.  Also, if you miss three or more appointments without notifying the office, you may be dismissed from the clinic at the provider?s discretion.    ?  ?For prescription refill requests, have your pharmacy contact our office and allow 72 hours for refills to be completed.   ? ?Today you received the following chemotherapy and/or immunotherapy agent: [name]    ?  ?To help prevent nausea and vomiting after your treatment, we encourage you to take your nausea medication as directed. ? ?BELOW ARE SYMPTOMS THAT SHOULD BE REPORTED IMMEDIATELY: ?*FEVER GREATER THAN 100.4 F (38 ?C) OR HIGHER ?*CHILLS OR SWEATING ?*NAUSEA AND VOMITING THAT IS NOT CONTROLLED WITH YOUR NAUSEA MEDICATION ?*UNUSUAL SHORTNESS OF BREATH ?*UNUSUAL BRUISING OR BLEEDING ?*URINARY PROBLEMS (pain or burning when urinating, or frequent urination) ?*BOWEL PROBLEMS (unusual diarrhea, constipation, pain near the anus) ?TENDERNESS IN MOUTH AND THROAT WITH OR WITHOUT PRESENCE OF ULCERS (sore throat, sores in mouth, or a toothache) ?UNUSUAL RASH, SWELLING OR PAIN  ?UNUSUAL VAGINAL DISCHARGE OR ITCHING  ? ?Items with * indicate a potential emergency and should be followed up as soon as possible or go to the Emergency Department if any problems should occur. ? ?Please show the CHEMOTHERAPY ALERT CARD or IMMUNOTHERAPY ALERT CARD at check-in to  the Emergency Department and triage nurse. ? ?Should you have questions after your visit or need to cancel or reschedule your appointment, please contact [facility]  Dept: [phone]  and follow the prompts.  Office hours are 8:00 a.m. to 4:30 p.m. Monday - Friday. Please note that voicemails left after 4:00 p.m. may not be returned until the following business day.  We are closed weekends and major holidays. You have access to a nurse at all times for urgent questions. Please call the main number to the clinic Dept: [phone] and follow the prompts. ? ? ?For any non-urgent questions, you may also contact your provider using MyChart. We now offer e-Visits for anyone 74 and older to request care online for non-urgent symptoms. For details visit [email or web address]. ?  ?Also download the MyChart app! Go to the app store, search "MyChart", open the app, select [facility], and log in with your MyChart username and password. ? ?Due to Covid, a mask is required upon entering the hospital/clinic. If you do not have a mask, one will be given to you upon arrival. For doctor visits, patients may have 1 support person aged 91 or older with them. For treatment visits, patients cannot have anyone with them due to current Covid guidelines and our immunocompromised population.  ? ?

## 2021-10-30 NOTE — Progress Notes (Signed)
?Caitlyn Hendricks   ?[phone] [phone]   ?Clinic Follow up Note  ? ?Patient Care Team: ?Caitlyn Hendricks., MD as PCP - General (Family Medicine) ?Caitlyn Old, NP as PCP - Dermatology (Nurse Practitioner) ?Caitlyn Kaufmann, RN as Oncology Nurse Navigator ?Caitlyn Germany, RN as Oncology Nurse Navigator ?Caitlyn Bookbinder, MD as Consulting Physician (General Surgery) ?Caitlyn Merle, MD as Consulting Physician (Hematology) ?Caitlyn Pray, MD as Consulting Physician (Radiation Oncology) ?Caitlyn Hendricks, [place] (Physician Assistant) ? ?Date of Service:  10/30/2021 ? ?CHIEF COMPLAINT: f/u of left breast cancer ? ?CURRENT THERAPY:  ?[name], started 09/17/21 ? ?ASSESSMENT & PLAN:  ?Caitlyn Hendricks is a 41 y.o. pre-menopausal female with  ? ?1. 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 Biopsy on 03/08/20 revealed invasive ductal carcinoma, triple negative, metastatic to her left axillary LN. CT CAP/Bone scan negative for distant mets.  ?-She completed neoadjuvant chemo with ddAC q2weeks for 4 cycles 03/22/20-05/03/20. Followed by weekly carbo/taxol for 12 weeks 05/17/20-08/16/20 before proceeding with surgery.  ?-she started Ballard Russell for 1 year treatment on 04/28/20, but stopped in 10/2020 due to skin rash and arthralgia.   ?-She underwent left mastectomy with Dr Donne Hazel on 09/20/20. Surgical path shows No residual invasive carcinoma in breast s/p neoadjuvant treatment, but 7/19 positive LN.  ?-she began concurrent chemoRT with oral Xeloda on 10/30/20 and completed radiation on 12/11/20. She developed a rash from Xeloda, and this was held from 5/16-6/27/22. When this was restarted, she again developed a rash and joint pain. It was switched to 5-FU on 01/18/21 for 4 cycles. During that time, she was  diagnosed with Lupus and put on medication. With the rash under control, she switched back to Xeloda and completed 6 months of treatment in 05/2021.  ?--she developed skin erythema with nodularity to left chest wall. Punch biopsy of the area on 08/28/21 revealed infiltrating carcinoma, consistent with breast origin. This was found to be Her2-. ?-PET scan on 09/13/21 showed: hypermetabolic left level IV, left supraclavicular, and right axillary adenopathy; new nodularity in apical segment LUL and in subpleural portions of LUL and LLL, concerning for infection, malignancy felt to be less likely.  ?-she started [employer] [name]) on 09/17/21. She tolerated well overall, left chest discomfort has improved ?-she developed diarrhea about a week after C1 and was hospitalized with C-diff on 09/28/21. She has recovered well with some residual loose stool, as well as itching and rash from one of the treatments. ?-treatment was held on 10/15/21 for her birthday and travel; she is scheduled to restart [place] today. Labs reviewed, improved during her break, will proceed with [name] as scheduled. ?-skin lesion on left chest wall improved, which is a good indication of clinical response  ?  ?2. C-diff colitis  ?-developed diarrhea around 09/24/21 ?-hospitalized 3/24-3/30/23 ?-developed rash and itching from IV treatment in the hospital ?-diarrhea resolved ?  ?3. Cutaneous Lupus ?-She had several episodes of rash since she started chemo treatment; skin biopsy showed a drug-related dermatitis. The rash persisted on Keytruda, Xeloda, and 5-FU. ?-she underwent lab work with her dermatologist on 02/27/21. Results revealed cutaneous Lupus. She is now on medication for this. ?-since being on the medication for Lupus, her rash has greatly improved. ?  ?4. Anxiety, Social support, PTSD ?-She has a history of anxiety and  has been on Xanax 59m TID and Celexa 479m ?-she is now seeing therapist JeStormy Cardnd psychiatrist KaDebby BudPAU[place] Her anxiety level has gotten much worse since recent metastatic cancer recurrence ?-She has very good social support from mother and boyfriend. 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 ?[id number] ?-she reports her psych medications have been re-adjusted. ?  ?4. Genetic Testing negative for pathogenetic mutations with VUS of POLE gene ?  ?5. COVID (+) on 08/02/20 with symptoms of Hoarseness and ribcage pain. She has recovered quickly and completely   ?  ?  ?PLAN: ?-proceed with TrIvette Loyaloday with 10% dose reduction  ?-lab, flush, f/u, and Trodelvy on 5/2 with GCSF on day 9 ?-f/u in 3 weeks before cycle 3  ? ? ?No problem-specific Assessment & Plan notes found for this encounter. ? ? ?SUMMARY OF ONCOLOGIC HISTORY: ?Oncology History Overview Note  ?Cancer Staging ?Cancer of central portion of left female breast ([facility]?Staging form: Breast, AJCC 8th Edition ?- Clinical stage from 03/08/2020: Stage IIIB (cT2, cN1, cM0, G3, ER-, PR-, HER2-) - Signed by Caitlyn Hendricks on 03/10/2020 ?Stage prefix: Initial diagnosis ?Histologic grading system: 3 grade system ?- Pathologic stage from 09/20/2020: No Stage Recommended (ypT0, pN2a, cM0, G3, ER-, PR-, HER2-) - Signed by Caitlyn Hendricks on 10/04/2020 ?Stage prefix: Post-therapy ?Histologic grading system: 3 grade system ? ?  ?Cancer of central portion of left female breast ([facility] ?02/23/2020 Mammogram  ? Diagnostic Mammogram 02/23/20  ?IMPRESSION ?The 2x1x2.6cm irregular mass in teh left breast at 12:00 posiiton middle depth is highly suspicious of malignancy. An [place] recommended for further evaluation and biopsy planning purposes. ?  ?02/23/2020 Breast [place]? [place] breast 02/23/20  ?IMPRESSION ?2 adjacent spiculated masses in the left brast at 12:00 position 3 cm from the nipple (2.1x0.9x1.1cm and 1.1x1.4x0.5cm) is suggestive of malignancy.  ? ?Multiple abnormal left subpectoral and left axillary nodes  measuring 3.3x2.1 cm concerning for metastatic adenopathy.  ? ? ?Left breast skin thickening and edema may be secondary congestive edema due to extensive axillary adenopathy.  ?  ?03/08/2020 Initial Biopsy  ? Diagnosis ?1.Breast, left, needle core biopsy, 12:00 position, 3cmfn ?-INVASIVE DUCTAL CARCINOMA ?-SEE COMMENT ? ?2. Lymph node, needle/core biopsy, left axilla ?-METASTATIC CARCINOMA INVOLVING A LYMPH NODE  ?-LYMPHOVASCULAR SPACE INVASION PRESENT  ? ?Microscopic Comment  ?1.Based on the biopsy the carcinoma appears Nottingham Grade 3 or 3 and measures 1 cm in the greatest linear extent.  ?  ?03/08/2020 Receptors her2  ? ER- Negative 0% ?PR - Negative 0% ?HER2 - Negative  ?KI 67 - 80% ? ?  ?03/08/2020 Cancer Staging  ? Staging form: Breast, AJCC 8th Edition ?- Clinical stage from 03/08/2020: Stage IIIB (cT2, cN1, cM0, G3, ER-, PR-, HER2-) - Signed by Caitlyn Hendricks on 03/10/2020 ? ?  ?03/10/2020 Initial Diagnosis  ? Cancer of central portion of left female breast ([facility]?  ?03/16/2020 Breast MRI  ? IMPRESSION: ?1. 8.1 x 7.8 x 6.6 cm biopsy proven invasive ductal carcinoma in the ?central right breast, involving 3 quadrants. ?2. 3.0 x 1.7 x 1.1 cm satellite mass more inferiorly in lower inner ?quadrant of the left breast, compatible with additional malignancy. ?3. Metastatic level 1 and level 2 left axillary lymph nodes. ?4. No evidence of malignancy on the right. ?  ?03/17/2020 Imaging  ? IMPRESSION: ?CT CAP w contrast  ?1. Diffuse skin  thickening in the left breast with left axillary and ?subpectoral lymphadenopathy, as well as a mildly enlarged left ?supraclavicular lymph node, which likely represents metastatic ?lymphadenopathy. No other definite extra nodal metastatic disease ?noted elsewhere in the chest, abdomen or pelvis. ?2. Large mass in the central anatomic pelvis which is of uncertain ?origin, potentially a large exophytic fibroid or a large solid mass ?arising from the right ovary. Further evaluation with  pelvic ?ultrasound is strongly recommended. ?  ?03/21/2020 Imaging  ? Bone Scan  ?IMPRESSION: ?Apparent arthropathy at L5. No bony metastatic disease is ?demonstrable on this study. Scattered foci of abnormal uptake in a

## 2021-11-06 ENCOUNTER — Inpatient Hospital Stay: Payer: Medicaid Other | Admitting: Dietician

## 2021-11-06 ENCOUNTER — Other Ambulatory Visit: Payer: Self-pay | Admitting: Hematology

## 2021-11-06 ENCOUNTER — Encounter: Payer: Self-pay | Admitting: Hematology

## 2021-11-06 ENCOUNTER — Inpatient Hospital Stay: Payer: Medicaid Other

## 2021-11-06 ENCOUNTER — Inpatient Hospital Stay (HOSPITAL_BASED_OUTPATIENT_CLINIC_OR_DEPARTMENT_OTHER): Payer: Medicaid Other | Admitting: Hematology

## 2021-11-06 ENCOUNTER — Inpatient Hospital Stay: Payer: Medicaid Other | Attending: Hematology

## 2021-11-06 ENCOUNTER — Other Ambulatory Visit: Payer: Self-pay

## 2021-11-06 VITALS — BP 131/95 | HR 103 | Temp 98.0°F | Resp 18 | Ht 64.0 in | Wt 224.4 lb

## 2021-11-06 VITALS — BP 130/96 | HR 93

## 2021-11-06 DIAGNOSIS — Z923 Personal history of irradiation: Secondary | ICD-10-CM | POA: Diagnosis not present

## 2021-11-06 DIAGNOSIS — Z171 Estrogen receptor negative status [ER-]: Secondary | ICD-10-CM

## 2021-11-06 DIAGNOSIS — K219 Gastro-esophageal reflux disease without esophagitis: Secondary | ICD-10-CM | POA: Insufficient documentation

## 2021-11-06 DIAGNOSIS — Z5189 Encounter for other specified aftercare: Secondary | ICD-10-CM | POA: Insufficient documentation

## 2021-11-06 DIAGNOSIS — Z79899 Other long term (current) drug therapy: Secondary | ICD-10-CM | POA: Insufficient documentation

## 2021-11-06 DIAGNOSIS — F419 Anxiety disorder, unspecified: Secondary | ICD-10-CM | POA: Diagnosis not present

## 2021-11-06 DIAGNOSIS — Z8616 Personal history of COVID-19: Secondary | ICD-10-CM | POA: Insufficient documentation

## 2021-11-06 DIAGNOSIS — A0472 Enterocolitis due to Clostridium difficile, not specified as recurrent: Secondary | ICD-10-CM | POA: Diagnosis not present

## 2021-11-06 DIAGNOSIS — F4310 Post-traumatic stress disorder, unspecified: Secondary | ICD-10-CM | POA: Diagnosis not present

## 2021-11-06 DIAGNOSIS — C50112 Malignant neoplasm of central portion of left female breast: Secondary | ICD-10-CM

## 2021-11-06 DIAGNOSIS — C50012 Malignant neoplasm of nipple and areola, left female breast: Secondary | ICD-10-CM | POA: Insufficient documentation

## 2021-11-06 DIAGNOSIS — D6862 Lupus anticoagulant syndrome: Secondary | ICD-10-CM | POA: Diagnosis not present

## 2021-11-06 DIAGNOSIS — Z5111 Encounter for antineoplastic chemotherapy: Secondary | ICD-10-CM | POA: Insufficient documentation

## 2021-11-06 DIAGNOSIS — Z95828 Presence of other vascular implants and grafts: Secondary | ICD-10-CM

## 2021-11-06 LAB — CBC WITH DIFFERENTIAL/PLATELET
Abs Immature Granulocytes: 0.02 10*3/uL (ref 0.00–0.07)
Basophils Absolute: 0 10*3/uL (ref 0.0–0.1)
Basophils Relative: 1 %
Eosinophils Absolute: 0.1 10*3/uL (ref 0.0–0.5)
Eosinophils Relative: 3 %
HCT: 32.7 % — ABNORMAL LOW (ref 36.0–46.0)
Hemoglobin: 10.9 g/dL — ABNORMAL LOW (ref 12.0–15.0)
Immature Granulocytes: 1 %
Lymphocytes Relative: 29 %
Lymphs Abs: 0.8 10*3/uL (ref 0.7–4.0)
MCH: 30 pg (ref 26.0–34.0)
MCHC: 33.3 g/dL (ref 30.0–36.0)
MCV: 90.1 fL (ref 80.0–100.0)
Monocytes Absolute: 0.2 10*3/uL (ref 0.1–1.0)
Monocytes Relative: 9 %
Neutro Abs: 1.7 10*3/uL (ref 1.7–7.7)
Neutrophils Relative %: 57 %
Platelets: 343 10*3/uL (ref 150–400)
RBC: 3.63 MIL/uL — ABNORMAL LOW (ref 3.87–5.11)
RDW: 18.3 % — ABNORMAL HIGH (ref 11.5–15.5)
WBC: 2.8 10*3/uL — ABNORMAL LOW (ref 4.0–10.5)
nRBC: 0 % (ref 0.0–0.2)

## 2021-11-06 LAB — COMPREHENSIVE METABOLIC PANEL
ALT: 45 U/L — ABNORMAL HIGH (ref 0–44)
AST: 32 U/L (ref 15–41)
Albumin: 4 g/dL (ref 3.5–5.0)
Alkaline Phosphatase: 120 U/L (ref 38–126)
Anion gap: 10 (ref 5–15)
BUN: 14 mg/dL (ref 6–20)
CO2: 28 mmol/L (ref 22–32)
Calcium: 9.3 mg/dL (ref 8.9–10.3)
Chloride: 104 mmol/L (ref 98–111)
Creatinine, Ser: 0.71 mg/dL (ref 0.44–1.00)
GFR, Estimated: >60 mL/min (ref >60)
Glucose, Bld: 115 mg/dL — ABNORMAL HIGH (ref 70–99)
Potassium: 3.7 mmol/L (ref 3.5–5.1)
Sodium: 142 mmol/L (ref 135–145)
Total Bilirubin: 0.3 mg/dL (ref 0.3–1.2)
Total Protein: 7.2 g/dL (ref 6.5–8.1)

## 2021-11-06 MED ORDER — HEPARIN SOD (PORK) LOCK FLUSH 100 UNIT/ML IV SOLN
500.0000 [IU] | Freq: Once | INTRAVENOUS | Status: AC | PRN
  Administered 2021-11-06: 500 [IU]

## 2021-11-06 MED ORDER — SODIUM CHLORIDE 0.9 % IV SOLN
9.0000 mg/kg | Freq: Once | INTRAVENOUS | Status: AC
  Administered 2021-11-06: 900 mg via INTRAVENOUS
  Filled 2021-11-06: qty 90

## 2021-11-06 MED ORDER — FAMOTIDINE IN NACL 20-0.9 MG/50ML-% IV SOLN
20.0000 mg | Freq: Once | INTRAVENOUS | Status: AC
  Administered 2021-11-06: 20 mg via INTRAVENOUS
  Filled 2021-11-06: qty 50

## 2021-11-06 MED ORDER — ATROPINE SULFATE 1 MG/ML IV SOLN
0.5000 mg | Freq: Once | INTRAVENOUS | Status: AC | PRN
  Administered 2021-11-06: 0.5 mg via INTRAVENOUS
  Filled 2021-11-06: qty 1

## 2021-11-06 MED ORDER — SODIUM CHLORIDE 0.9 % IV SOLN: Freq: Once | INTRAVENOUS | Status: AC

## 2021-11-06 MED ORDER — SODIUM CHLORIDE 0.9 % IV SOLN
10.0000 mg | Freq: Once | INTRAVENOUS | Status: AC
  Administered 2021-11-06: 10 mg via INTRAVENOUS
  Filled 2021-11-06: qty 1
  Filled 2021-11-06: qty 10

## 2021-11-06 MED ORDER — PALONOSETRON HCL INJECTION 0.25 MG/5ML
0.2500 mg | Freq: Once | INTRAVENOUS | Status: AC
  Administered 2021-11-06: 0.25 mg via INTRAVENOUS
  Filled 2021-11-06: qty 5

## 2021-11-06 MED ORDER — ACETAMINOPHEN 325 MG PO TABS
650.0000 mg | ORAL_TABLET | Freq: Once | ORAL | Status: AC
  Administered 2021-11-06: 650 mg via ORAL
  Filled 2021-11-06: qty 2

## 2021-11-06 MED ORDER — DIPHENHYDRAMINE HCL 50 MG/ML IJ SOLN
50.0000 mg | Freq: Once | INTRAMUSCULAR | Status: AC
  Administered 2021-11-06: 50 mg via INTRAVENOUS
  Filled 2021-11-06: qty 1

## 2021-11-06 MED ORDER — SODIUM CHLORIDE 0.9% FLUSH
10.0000 mL | INTRAVENOUS | Status: DC | PRN
  Administered 2021-11-06: 10 mL

## 2021-11-06 MED ORDER — SODIUM CHLORIDE 0.9 % IV SOLN
150.0000 mg | Freq: Once | INTRAVENOUS | Status: AC
  Administered 2021-11-06: 150 mg via INTRAVENOUS
  Filled 2021-11-06: qty 5
  Filled 2021-11-06: qty 150

## 2021-11-06 MED ORDER — SODIUM CHLORIDE 0.9% FLUSH
10.0000 mL | Freq: Once | INTRAVENOUS | Status: AC
  Administered 2021-11-06: 10 mL

## 2021-11-06 NOTE — Progress Notes (Signed)
?[facility]   ?Telephone:([phone] [phone]   ?Clinic Follow up Note  ? ?Patient Care Team: ?[name], MD as PCP - General (Family Medicine) ?Morrison Old, NP as PCP - Dermatology (Nurse Practitioner) ?Mauro Kaufmann, RN as Oncology Nurse Navigator ?Rockwell Germany, RN as Oncology Nurse Navigator ?[name], MD as Consulting Physician (General Surgery) ?Truitt Merle, MD as Consulting Physician (Hematology) ?Gery Pray, MD as Consulting Physician (Radiation Oncology) ?Caitlyn Hendricks, [place] (Physician Assistant) ? ?Date of Service:  11/06/2021 ? ?CHIEF COMPLAINT: f/u of left breast cancer ? ?CURRENT THERAPY:  ?Caitlyn Hendricks, started 09/17/21 ? ?ASSESSMENT & PLAN:  ?Caitlyn Hendricks is a 41 y.o. pre-menopausal female with  ? ?1. 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 Biopsy on 03/08/20 revealed invasive ductal carcinoma, triple negative, metastatic to her left axillary LN. CT CAP/Bone scan negative for distant mets.  ?-She completed neoadjuvant chemo with ddAC q2weeks for 4 cycles 03/22/20-05/03/20. Followed by weekly carbo/taxol for 12 weeks 05/17/20-08/16/20 before proceeding with surgery.  ?-she started Ballard Russell for 1 year treatment on 04/28/20, but stopped in 10/2020 due to skin rash and arthralgia.   ?-She underwent left mastectomy with Dr Donne Hazel on 09/20/20. Surgical path shows No residual invasive carcinoma in breast s/p neoadjuvant treatment, but 7/19 positive LN.  ?-she began concurrent chemoRT with oral Xeloda on 10/30/20 and completed radiation on 12/11/20. She developed a rash from Xeloda, and this was held from 5/16-6/27/22. When this was restarted, she again developed a rash and joint pain. It was switched to 5-FU on 01/18/21 for 4 cycles. During that time, she was  diagnosed with Lupus and put on medication. With the rash under control, she switched back to Xeloda and completed 6 months of treatment in 05/2021.  ?--she developed skin erythema with nodularity to left chest wall. Punch biopsy of the area on 08/28/21 revealed infiltrating carcinoma, consistent with breast origin. This was found to be Her2-. ?-PET scan on 09/13/21 showed: hypermetabolic left level IV, left supraclavicular, and right axillary adenopathy; new nodularity in apical segment LUL and in subpleural portions of LUL and LLL, concerning for infection, malignancy felt to be less likely.  ?-she started [employer] Caitlyn Hendricks) on 09/17/21. She tolerated well overall, left chest discomfort has improved ?-she developed diarrhea about a week after C1 and was hospitalized with C-diff on 09/28/21. She has recovered well with some residual loose stool, as well as itching and rash from one of the treatments. ?-labs reviewed, WBC 2.8, she is scheduled for GCF-S tomorrow afternoon. ?-skin lesion on left chest wall improved, which is a good indication of clinical response  ?  ?2. C-diff colitis  ?-developed diarrhea around 09/24/21 ?-hospitalized 3/24-3/30/23 ?-developed rash and itching from IV treatment in the hospital ?-diarrhea resolved ?  ?3. Cutaneous Lupus ?-She had several episodes of rash since she started chemo treatment; skin biopsy showed a drug-related dermatitis. The rash persisted on Keytruda, Xeloda, and 5-FU. ?-she underwent lab work with her dermatologist on 02/27/21. Results revealed cutaneous Lupus. She is now on medication for this. ?-since being on the medication for Lupus, her rash has greatly improved. ?  ?4. Anxiety, Social support, PTSD ?-She has a history of anxiety and has been on Xanax 55m TID and Celexa 42m ?-she is now seeing therapist Caitlyn Hendricks  psychiatrist Caitlyn Bud, PA.  Her anxiety level has gotten much worse since recent metastatic cancer recurrence ?-She has very good  social support from mother and boyfriend. 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 ?-she enrolled in [place] [employer]23 ?-she reports her psych medications have been re-adjusted. ?  ?4. Genetic Testing negative for pathogenetic mutations with VUS of POLE gene ?  ?5. COVID (+) on 08/02/20 with symptoms of Hoarseness and ribcage pain. She has recovered quickly and completely   ?  ?  ?PLAN: ?-proceed with Caitlyn Hendricks today with same 10% dose reduction  ?-GCSF on day 9 (tomorrow)  ?-f/u in 2 weeks before cycle 3  ? ? ?No problem-specific Assessment & Plan notes found for this encounter. ? ? ?SUMMARY OF ONCOLOGIC HISTORY: ?Oncology History Overview Note  ?Cancer Staging ?Cancer of central portion of left female breast ([place]) ?Staging form: Breast, AJCC 8th Edition ?- Clinical stage from 03/08/2020: Stage IIIB (cT2, cN1, cM0, G3, ER-, PR-, HER2-) - Signed by Truitt Merle, MD on 03/10/2020 ?Stage prefix: Initial diagnosis ?Histologic grading system: 3 grade system ?- Pathologic stage from 09/20/2020: No Stage Recommended (ypT0, pN2a, cM0, G3, ER-, PR-, HER2-) - Signed by Gardenia Phlegm, NP on 10/04/2020 ?Stage prefix: Post-therapy ?Histologic grading system: 3 grade system ? ?  ?Cancer of central portion of left female breast ([place])  ?02/23/2020 Mammogram  ? Diagnostic Mammogram 02/23/20  ?IMPRESSION ?The 2x1x2.6cm irregular mass in teh left breast at 12:00 posiiton middle depth is highly suspicious of malignancy. An [place] is recommended for further evaluation and biopsy planning purposes. ?  ?02/23/2020 Breast US  ? [name] 02/23/20  ?IMPRESSION ?2 adjacent spiculated masses in the left brast at 12:00 position 3 cm from the nipple (2.1x0.9x1.1cm and 1.1x1.4x0.5cm) is suggestive of malignancy.  ? ?Multiple abnormal left subpectoral and left axillary nodes measuring 3.3x2.1 cm concerning for metastatic adenopathy.  ? ? ?Left breast skin thickening and edema may be secondary congestive edema due  to extensive axillary adenopathy.  ?  ?03/08/2020 Initial Biopsy  ? Diagnosis ?1.Breast, left, needle core biopsy, 12:00 position, 3cmfn ?-INVASIVE DUCTAL CARCINOMA ?-SEE COMMENT ? ?2. Lymph node, needle/core biopsy, left axilla ?-METASTATIC CARCINOMA INVOLVING A LYMPH NODE  ?-LYMPHOVASCULAR SPACE INVASION PRESENT  ? ?Microscopic Comment  ?1.Based on the biopsy the carcinoma appears Nottingham Grade 3 or 3 and measures 1 cm in the greatest linear extent.  ?  ?03/08/2020 Receptors her2  ? ER- Negative 0% ?PR - Negative 0% ?HER2 - Negative  ?KI 67 - 80% ? ?  ?03/08/2020 Cancer Staging  ? Staging form: Breast, AJCC 8th Edition ?- Clinical stage from 03/08/2020: Stage IIIB (cT2, cN1, cM0, G3, ER-, PR-, HER2-) - Signed by Truitt Merle, MD on 03/10/2020 ? ?  ?03/10/2020 Initial Diagnosis  ? Cancer of central portion of left female breast [facility]) ?  ?03/16/2020 Breast MRI  ? IMPRESSION: ?1. 8.1 x 7.8 x 6.6 cm biopsy proven invasive ductal carcinoma in the ?central right breast, involving 3 quadrants. ?2. 3.0 x 1.7 x 1.1 cm satellite mass more inferiorly in lower inner ?quadrant of the left breast, compatible with additional malignancy. ?3. Metastatic level 1 and level 2 left axillary lymph nodes. ?4. No evidence of malignancy on the right. ?  ?03/17/2020 Imaging  ? IMPRESSION: ?CT CAP w contrast  ?1. Diffuse skin thickening in the left breast with left axillary and ?subpectoral lymphadenopathy, as well as a mildly enlarged left ?supraclavicular lymph node, which likely  represents metastatic ?lymphadenopathy. No other definite extra nodal metastatic disease ?noted elsewhere in the chest, abdomen or pelvis. ?2. Large mass in the central anatomic pelvis which is of uncertain ?origin, potentially a large exophytic fibroid or a large solid mass ?arising from the right ovary. Further evaluation with pelvic ?ultrasound is strongly recommended. ?  ?03/21/2020 Imaging  ? Bone Scan  ?IMPRESSION: ?Apparent arthropathy at L5. No bony metastatic disease  is ?demonstrable on this study. Scattered foci of abnormal uptake in a ?pelvic mass is of uncertain etiology given absence of calcification ?in this mass by CT. This mass compresses the urinary bladder. It is ?po

## 2021-11-06 NOTE — Progress Notes (Signed)
Per Dr. [name] - okay to treat with elevated BP. ? ?Patient declined to stay for 30 minute post-observation following administration of Trodelvy infusion. Patient showed no signs of distress upon discharge.  ?

## 2021-11-06 NOTE — Progress Notes (Signed)
Nutrition Assessment ? ? ?Reason for Assessment: Patient request ? ? ?ASSESSMENT: 41 year old female with left breast, invasive ductal carcinoma, triple negative with left chest wall node recurrence in 08/2021. S/p neoadjuvant chemo (03/22/20-08/16/20) followed by left mastectomy with Dr. Donne Hazel on 09/20/20 and concurrent chemoRT (final RT 12/11/20). Patient is currently started chemotherapy with Trodelvy on 09/17/21. Patient is under the care of Dr. [name] ? ?3/24-3/30 - hospital admission for C-diff ? ?Past medical history includes cutaneous lupus, depression with anxiety, PTSD, anemia of chronic disease ? ?Met with patient during infusion. She reports good appetite and intake. Diarrhea has resolved. Patient denies nausea, vomiting, constipation. Patient has questions about what foods to avoid, temperature recommendations for steak/poultry/eggs. Patient asking if she could resume cleaning out the cat liter box and if handling her snake is okay to do.  ? ?Nutrition Focused Physical Exam: deferred ? ? ?Medications: protonix, lomotil, decadron, zoloft, plaquenil, xanax ? ? ?Labs: Glucose 115, ALT 45, WBC 2.8 (GCF-S 5/3) ? ? ?Anthropometrics:  ? ?Height: 5'4" ?Weight: 224 lb 6.4 oz  ?UBW: 140 lb (prior to starting treatment per pt) ?BMI: 38.52 ? ? ?NUTRITION DIAGNOSIS: Food and nutrition related knowledge deficit related to cancer and associated treatment as evidenced by food safety questions/concerns ? ? ?INTERVENTION:  ?All questions answered - fact sheet on food safety provided ?Discussed strategies for diarrhea, foods to avoid and importance of hydration  - handout with tips provided  ?Encouraged small frequent meals and snacks with good sources of lean proteins - handout with snack ideas provided ?Encouraged activity as able ?Contact information provided  ? ? ?MONITORING, EVALUATION, GOAL: Patient will tolerate adequate calories and protein to minimize weight loss during treatment  ? ? ?Next Visit: To be scheduled as  needed. Patient has contact information and encouraged to reach out with nutrition questions/concerns ? ? ? ? ? ? ?

## 2021-11-06 NOTE — Patient Instructions (Signed)
[facility]   ?Discharge Instructions: ?Thank you for choosing [facility] to provide your oncology and hematology care.  ? ?If you have a lab appointment with the [facility], please go directly to the [facility] and check in at the registration area. ?  ?Wear comfortable clothing and clothing appropriate for easy access to any Portacath or PICC line.  ? ?We strive to give you quality time with your provider. You may need to reschedule your appointment if you arrive late (15 or more minutes).  Arriving late affects you and other patients whose appointments are after yours.  Also, if you miss three or more appointments without notifying the office, you may be dismissed from the clinic at the provider?s discretion.    ?  ?For prescription refill requests, have your pharmacy contact our office and allow 72 hours for refills to be completed.   ? ?Today you received the following chemotherapy and/or immunotherapy agents: Sacituzumab [id number] Caitlyn Hendricks)    ?  ?To help prevent nausea and vomiting after your treatment, we encourage you to take your nausea medication as directed. ? ?BELOW ARE SYMPTOMS THAT SHOULD BE REPORTED IMMEDIATELY: ?*FEVER GREATER THAN 100.4 F (38 ?C) OR HIGHER ?*CHILLS OR SWEATING ?*NAUSEA AND VOMITING THAT IS NOT CONTROLLED WITH YOUR NAUSEA MEDICATION ?*UNUSUAL SHORTNESS OF BREATH ?*UNUSUAL BRUISING OR BLEEDING ?*URINARY PROBLEMS (pain or burning when urinating, or frequent urination) ?*BOWEL PROBLEMS (unusual diarrhea, constipation, pain near the anus) ?TENDERNESS IN MOUTH AND THROAT WITH OR WITHOUT PRESENCE OF ULCERS (sore throat, sores in mouth, or a toothache) ?UNUSUAL RASH, SWELLING OR PAIN  ?UNUSUAL VAGINAL DISCHARGE OR ITCHING  ? ?Items with * indicate a potential emergency and should be followed up as soon as possible or go to the Emergency Department if any problems should occur. ? ?Please show the CHEMOTHERAPY ALERT CARD or  IMMUNOTHERAPY ALERT CARD at check-in to the Emergency Department and triage nurse. ? ?Should you have questions after your visit or need to cancel or reschedule your appointment, please contact [facility]  Dept: [phone]  and follow the prompts.  Office hours are 8:00 a.m. to 4:30 p.m. Monday - Friday. Please note that voicemails left after 4:00 p.m. may not be returned until the following business day.  We are closed weekends and major holidays. You have access to a nurse at all times for urgent questions. Please call the main number to the clinic Dept: [phone] and follow the prompts. ? ? ?For any non-urgent questions, you may also contact your provider using MyChart. We now offer e-Visits for anyone 54 and older to request care online for non-urgent symptoms. For details visit [email or web address]. ?  ?Also download the MyChart app! Go to the app store, search "MyChart", open the app, select , and log in with your MyChart username and password. ? ?Due to Covid, a mask is required upon entering the hospital/clinic. If you do not have a mask, one will be given to you upon arrival. For doctor visits, patients may have 1 support person aged 4 or older with them. For treatment visits, patients cannot have anyone with them due to current Covid guidelines and our immunocompromised population.  ? ?

## 2021-11-07 ENCOUNTER — Other Ambulatory Visit: Payer: Self-pay

## 2021-11-07 ENCOUNTER — Encounter: Payer: Self-pay | Admitting: General Practice

## 2021-11-07 ENCOUNTER — Inpatient Hospital Stay: Payer: Medicaid Other

## 2021-11-07 VITALS — BP 134/96 | HR 68 | Temp 97.7°F | Resp 18

## 2021-11-07 DIAGNOSIS — Z5111 Encounter for antineoplastic chemotherapy: Secondary | ICD-10-CM | POA: Diagnosis not present

## 2021-11-07 DIAGNOSIS — C50112 Malignant neoplasm of central portion of left female breast: Secondary | ICD-10-CM

## 2021-11-07 MED ORDER — PEGFILGRASTIM-CBQV 6 MG/0.6ML ~~LOC~~ SOSY
6.0000 mg | PREFILLED_SYRINGE | Freq: Once | SUBCUTANEOUS | Status: AC
  Administered 2021-11-07: 6 mg via SUBCUTANEOUS
  Filled 2021-11-07: qty 0.6

## 2021-11-07 NOTE — Progress Notes (Signed)
[facility] Spiritual Care Note ? ?Missed Marysa in infusion yesterday, so left voicemail of support and availability today. Plan to follow up at next treatment. ? ? ?Chaplain Lorrin Jackson, MDiv, [facility] ?Pager [phone] ?Voicemail [phone]  ?

## 2021-11-07 NOTE — Patient Instructions (Signed)

## 2021-11-09 ENCOUNTER — Telehealth: Payer: Self-pay

## 2021-11-09 MED ORDER — PROCHLORPERAZINE MALEATE 10 MG PO TABS: 10.0000 mg | ORAL_TABLET | Freq: Four times a day (QID) | ORAL | 1 refills | Status: DC | PRN

## 2021-11-09 NOTE — Telephone Encounter (Signed)
Spoke with pt via telephone regarding diarrhea.  Pt stated she had several episodes of diarrhea and got light headed while sitting on the toilet.  Pt stated she became hot and real flushed.  Pt stated that she feel better now since she's drunk 2 bottles of Liquid IV and the diarrhea has gotten better now since she has taken the Lomotil and Compazine.  Instructed pt to continue drinking the Liquid IV for hydration and taking the Lomotil for the diarrhea.  Pt stated she has some nausea as well and requested a refill on her Compazine.  Informed pt that she was most likely having a Vasovagal Effect while sitting on the commode.  Educated the pt on what vasovagal symptoms and cause.  Pt verbalized understanding.  Notified Dr. [name] of pt's call. ?

## 2021-11-12 ENCOUNTER — Telehealth: Payer: Self-pay

## 2021-11-12 NOTE — Telephone Encounter (Signed)
LVM asking Caitlyn Hendricks how she's doing since our last telephone conversation.  Left message to see if Caitlyn Hendricks is still experiencing diarrhea, nausea, and light headedness.  Instructed Caitlyn Hendricks to contact Dr. Ernestina Penna office or send a MyChart message stating how she is doing. ?

## 2021-11-13 ENCOUNTER — Telehealth: Payer: Self-pay | Admitting: General Practice

## 2021-11-13 NOTE — Telephone Encounter (Signed)
-----   Message from Woodroe Mode, MD sent at 11/10/2021  5:36 PM EDT ----- ?Yes contact her, thank you ?----- Message ----- ?From: Maurine Minister, NT ?Sent: 11/07/2021   2:33 PM EDT ?To: Woodroe Mode, MD, Lavonia Drafts, MD ? ?I never received a message.  Do I need to reach out to her? ?----- Message ----- ?From: Lavonia Drafts, MD ?Sent: 11/07/2021   2:14 PM EDT ?To: Woodroe Mode, MD, Maurine Minister, NT ? ?I never saw this pt. Did you opt not to send her or, did we somehow drop the ball? ? ?Clh-S ?----- Message ----- ?From: Woodroe Mode, MD ?Sent: 07/31/2021  10:09 AM EDT ?To: Lavonia Drafts, MD ? ?Patient has subserosal fibroids and would like to consult for [facility], endometrial biopsy was done today. Thanks ?Clair Gulling ? ? ? ? ?

## 2021-11-13 NOTE — Telephone Encounter (Signed)
Spoke with patient to schedule consult for [place].  Patient declined due to having cancer.  Would like to hold off on surgery at this time. ?

## 2021-11-14 ENCOUNTER — Other Ambulatory Visit: Payer: Self-pay

## 2021-11-16 ENCOUNTER — Telehealth: Payer: Self-pay | Admitting: Hematology

## 2021-11-16 ENCOUNTER — Other Ambulatory Visit: Payer: Self-pay | Admitting: Hematology

## 2021-11-16 NOTE — Telephone Encounter (Signed)
I called Caitlyn Hendricks to follow-up her diarrhea.  The diarrhea started after last dose chemo on Nov 06, 2021.  She has loose bowel movement especially in the morning and evening time, 5-6 times a day with taking Lomotil 4 to 5 tablets a day.  Mild cramps, no nausea, no fever, she has been able to eat, and drink fluids adequately, no dizziness or lightheaded.  She does not feel it's C-diff which she had before.  This is likely related to her chemotherapy.  I recommend her to increase Lomotil to 5 to 7 tablets a day, will decrease her chemo dose slightly for next cycle. ? ?Truitt Merle  ?11/16/2021  ?

## 2021-11-19 NOTE — Progress Notes (Signed)
?Caitlyn Hendricks   ?[phone] [phone]   ?Clinic Follow up Note  ? ?Patient Care Team: ?[name], MD as PCP - General (Family Medicine) ?Morrison Old, NP as PCP - Dermatology (Nurse Practitioner) ?Mauro Kaufmann, RN as Oncology Nurse Navigator ?Rockwell Germany, RN as Oncology Nurse Navigator ?[name], MD as Consulting Physician (General Surgery) ?Truitt Merle, MD as Consulting Physician (Hematology) ?Gery Pray, MD as Consulting Physician (Radiation Oncology) ?Debby Bud, [place] (Physician Assistant) ?11/21/2021 ? ?CHIEF COMPLAINT: F/up metastatic breast cancer  ? ?SUMMARY OF ONCOLOGIC HISTORY: ?Oncology History Overview Note  ?Cancer Staging ?Cancer of central portion of left female breast ([facility]) ?Staging form: Breast, AJCC 8th Edition ?- Clinical stage from 03/08/2020: Stage IIIB (cT2, cN1, cM0, G3, ER-, PR-, HER2-) - Signed by Truitt Merle, MD on 03/10/2020 ?Stage prefix: Initial diagnosis ?Histologic grading system: 3 grade system ?- Pathologic stage from 09/20/2020: No Stage Recommended (ypT0, pN2a, cM0, G3, ER-, PR-, HER2-) - Signed by Gardenia Phlegm, NP on 10/04/2020 ?Stage prefix: Post-therapy ?Histologic grading system: 3 grade system ? ?  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 Diagnostic Mammogram 02/23/20  ?IMPRESSION ?The 2x1x2.6cm irregular mass in teh left breast at 12:00 posiiton middle depth is highly suspicious of malignancy. An [place] is recommended for further evaluation and biopsy planning purposes. ?  ?02/23/2020 Breast US  ? [name] 02/23/20  ?IMPRESSION ?2 adjacent spiculated masses in the left brast at 12:00 position 3 cm from the nipple (2.1x0.9x1.1cm and 1.1x1.4x0.5cm) is suggestive of malignancy.  ? ?Multiple abnormal left subpectoral and left axillary nodes measuring 3.3x2.1 cm concerning for metastatic adenopathy.  ? ? ?Left breast skin thickening and edema may be secondary congestive  edema due to extensive axillary adenopathy.  ?  ?03/08/2020 Initial Biopsy  ? Diagnosis ?1.Breast, left, needle core biopsy, 12:00 position, 3cmfn ?-INVASIVE DUCTAL CARCINOMA ?-SEE COMMENT ? ?2. Lymph node, needle/core biopsy, left axilla ?-METASTATIC CARCINOMA INVOLVING A LYMPH NODE  ?-LYMPHOVASCULAR SPACE INVASION PRESENT  ? ?Microscopic Comment  ?1.Based on the biopsy the carcinoma appears Nottingham Grade 3 or 3 and measures 1 cm in the greatest linear extent.  ?  ?03/08/2020 Receptors her2  ? ER- Negative 0% ?PR - Negative 0% ?HER2 - Negative  ?KI 67 - 80% ? ?  ?03/08/2020 Cancer Staging  ? Staging form: Breast, AJCC 8th Edition ?- Clinical stage from 03/08/2020: Stage IIIB (cT2, cN1, cM0, G3, ER-, PR-, HER2-) - Signed by Truitt Merle, MD on 03/10/2020 ? ?  ?03/10/2020 Initial Diagnosis  ? Cancer of central portion of left female breast [facility]) ?  ?03/16/2020 Breast MRI  ? IMPRESSION: ?1. 8.1 x 7.8 x 6.6 cm biopsy proven invasive ductal carcinoma in the ?central right breast, involving 3 quadrants. ?2. 3.0 x 1.7 x 1.1 cm satellite mass more inferiorly in lower inner ?quadrant of the left breast, compatible with additional malignancy. ?3. Metastatic level 1 and level 2 left axillary lymph nodes. ?4. No evidence of malignancy on the right. ?  ?03/17/2020 Imaging  ? IMPRESSION: ?CT CAP w contrast  ?1. Diffuse skin thickening in the left breast with left axillary and ?subpectoral lymphadenopathy, as well as a mildly enlarged left ?supraclavicular lymph node, which likely represents metastatic ?lymphadenopathy. No other definite extra nodal metastatic disease ?noted elsewhere in the chest, abdomen or pelvis. ?2. Large mass in the central anatomic pelvis which is of uncertain ?origin, potentially a large exophytic fibroid or a large  solid mass ?arising from the right ovary. Further evaluation with pelvic ?ultrasound is strongly recommended. ?  ?03/21/2020 Imaging  ? Bone Scan  ?IMPRESSION: ?Apparent arthropathy at L5. No bony metastatic  disease is ?demonstrable on this study. Scattered foci of abnormal uptake in a ?pelvic mass is of uncertain etiology given absence of calcification ?in this mass by CT. This mass compresses the urinary bladder. It is ?possible that some of the increased uptake in this area actually ?represents physiologic uptake within the bladder. ?  ?Kidneys noted in flank positions bilaterally. ?  ?  ?03/22/2020 - 08/16/2020 Neo-Adjuvant Chemotherapy  ? Neoadjuvant Adriamycin and Cytoxan q2weeks for 4 cycles starting 03/22/20-05/03/20 followed by weekly Taxol and Carboplatin for 12 weeks starting 05/17/20-08/16/20 ?  ?03/24/2020 Imaging  ? US Pelvis  ?IMPRESSION: ?1. Large pedunculated lesion directly contiguous with the uterine ?most suggestive of a large subserosal fibroid which measures up to ?8.2 cm. ?2. Additional 1 cm probable intramural fibroid in the right anterior ?uterine body. ?3. No other acute or worrisome pelvic abnormality. ?  ?03/29/2020 Genetic Testing  ? Negative genetic testing on the common hereditary cancer panel.  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  The Common Hereditary Gene Panel offered by [facility] includes sequencing and/or deletion duplication testing of the following 47 genes: APC, ATM, AXIN2, BARD1, BMPR1A, BRCA1, BRCA2, BRIP1, CDH1, CDK4, CDKN2A (p14ARF), CDKN2A (p16INK4a), CHEK2, CTNNA1, DICER1, EPCAM (Deletion/duplication testing only), GREM1 (promoter region deletion/duplication testing only), KIT, MEN1, MLH1, MSH2, MSH3, MSH6, MUTYH, NBN, NF1, NHTL1, PALB2, PDGFRA, PMS2, POLD1, POLE, PTEN, RAD50, RAD51C, RAD51D, SDHB, SDHC, SDHD, SMAD4, SMARCA4. STK11, TP53, TSC1, TSC2, and VHL.  The following genes were evaluated for sequence changes only: SDHA and HOXB13 c.251G>A variant only. The report date is 03/29/2020 ?  ?04/28/2020 - 10/27/2020 Antibody Plan  ? Added Keytruda q3weeks starting 04/28/20 to complete 1 year of treatment. Held since 10/27/20 due to skin rash and body aches.  ?  ?08/17/2020 Breast MRI  ?  IMPRESSION: ?1. The biopsy proven malignancy in the left breast and the adjacent ?satellite lesion have resolved in the interval. No abnormalities in ?these locations today. ?2. No MRI evidence of malignancy in the right breast. ?3. No adenopathy identified today ?  ?09/20/2020 Surgery  ? LEFT MODIFIED RADICAL MASTECTOMY by Dr Donne Hazel ?  ?09/20/2020 Pathology Results  ? FINAL MICROSCOPIC DIAGNOSIS:  ? ?A. BREAST, LEFT, MODIFIED RADICAL MASTECTOMY:  ?- No residual invasive carcinoma in breast status post neoadjuvant  ?treatment.  ?- Metastatic carcinoma in (2) of (14) lymph nodes.  ?- Biopsy sites (one in breast, one in one of the positive lymph nodes).  ?- See oncology table.  ? ?B. ADDITIONAL AXILLARY CONTENTS, LEFT, DISSECTION:  ?- Metastatic carcinoma in (5) of (5) lymph nodes.  ?  ?09/20/2020 Cancer Staging  ? Staging form: Breast, AJCC 8th Edition ?- Pathologic stage from 09/20/2020: No Stage Recommended (ypT0, pN2a, cM0, G3, ER-, PR-, HER2-) - Signed by Gardenia Phlegm, NP on 10/04/2020 ?Stage prefix: Post-therapy ?Histologic grading system: 3 grade system ? ?  ?10/30/2020 - 12/11/2020 Radiation Therapy  ? Adjuvant Radiation with Dr Sondra Come starting 10/30/20 ?  ?10/30/2020 -  Chemotherapy  ? Adjuvant Xeloda starting 10/30/20 at 1565m BID M-F while on RT. Held since 11/20/2020 due to recurrent skin rash ?  ?01/18/2021 - 03/17/2021 Chemotherapy  ? Patient is on Treatment Plan : COLORECTAL 5FU / Leucovorin Modified [place] q14d x 12 cycles  ? ?   ?03/28/2021 Imaging  ? CT CAP ? ?  IMPRESSION: ?1. Status post left modified radical mastectomy and left axillary ?lymph node dissection, with no definitive findings to suggest ?metastatic disease in the chest, abdomen or pelvis on today's ?examination. Developing postradiation fibrosis in the apex of the ?left upper lobe is new compared to the prior study, but typical in ?the setting of prior axillary radiation therapy. ?2. There appears to be 2 lesions in the uterus, one  of which appears slightly enlarged compared to the prior study, while the largest lesion on the prior examination has partially regressed, as detailed above. ?3. Hepatic steatosis. ?  ?06/15/2021 Imaging

## 2021-11-20 MED FILL — Dexamethasone Sodium Phosphate Inj 100 MG/10ML: INTRAMUSCULAR | Qty: 1 | Status: AC

## 2021-11-20 MED FILL — Fosaprepitant Dimeglumine For IV Infusion 150 MG (Base Eq): INTRAVENOUS | Qty: 5 | Status: AC

## 2021-11-21 ENCOUNTER — Encounter: Payer: Self-pay | Admitting: General Practice

## 2021-11-21 ENCOUNTER — Other Ambulatory Visit: Payer: Self-pay | Admitting: Hematology

## 2021-11-21 ENCOUNTER — Inpatient Hospital Stay: Payer: Medicaid Other | Admitting: Nurse Practitioner

## 2021-11-21 ENCOUNTER — Encounter: Payer: Self-pay | Admitting: Nurse Practitioner

## 2021-11-21 ENCOUNTER — Inpatient Hospital Stay: Payer: Medicaid Other

## 2021-11-21 ENCOUNTER — Other Ambulatory Visit: Payer: Self-pay

## 2021-11-21 VITALS — BP 127/96 | HR 96 | Temp 98.3°F | Resp 17 | Wt 228.7 lb

## 2021-11-21 DIAGNOSIS — C50112 Malignant neoplasm of central portion of left female breast: Secondary | ICD-10-CM

## 2021-11-21 DIAGNOSIS — Z171 Estrogen receptor negative status [ER-]: Secondary | ICD-10-CM

## 2021-11-21 DIAGNOSIS — Z5111 Encounter for antineoplastic chemotherapy: Secondary | ICD-10-CM | POA: Diagnosis not present

## 2021-11-21 LAB — COMPREHENSIVE METABOLIC PANEL
ALT: 35 U/L (ref 0–44)
AST: 23 U/L (ref 15–41)
Albumin: 3.9 g/dL (ref 3.5–5.0)
Alkaline Phosphatase: 129 U/L — ABNORMAL HIGH (ref 38–126)
Anion gap: 7 (ref 5–15)
BUN: 18 mg/dL (ref 6–20)
CO2: 25 mmol/L (ref 22–32)
Calcium: 9 mg/dL (ref 8.9–10.3)
Chloride: 105 mmol/L (ref 98–111)
Creatinine, Ser: 0.67 mg/dL (ref 0.44–1.00)
GFR, Estimated: >60 mL/min (ref >60)
Glucose, Bld: 112 mg/dL — ABNORMAL HIGH (ref 70–99)
Potassium: 4.4 mmol/L (ref 3.5–5.1)
Sodium: 137 mmol/L (ref 135–145)
Total Bilirubin: 0.2 mg/dL — ABNORMAL LOW (ref 0.3–1.2)
Total Protein: 6.9 g/dL (ref 6.5–8.1)

## 2021-11-21 LAB — CBC WITH DIFFERENTIAL/PLATELET
Abs Immature Granulocytes: 0.07 10*3/uL (ref 0.00–0.07)
Basophils Absolute: 0 10*3/uL (ref 0.0–0.1)
Basophils Relative: 1 %
Eosinophils Absolute: 0.1 10*3/uL (ref 0.0–0.5)
Eosinophils Relative: 2 %
HCT: 34.5 % — ABNORMAL LOW (ref 36.0–46.0)
Hemoglobin: 11.1 g/dL — ABNORMAL LOW (ref 12.0–15.0)
Immature Granulocytes: 2 %
Lymphocytes Relative: 18 %
Lymphs Abs: 0.9 10*3/uL (ref 0.7–4.0)
MCH: 30.2 pg (ref 26.0–34.0)
MCHC: 32.2 g/dL (ref 30.0–36.0)
MCV: 94 fL (ref 80.0–100.0)
Monocytes Absolute: 0.5 10*3/uL (ref 0.1–1.0)
Monocytes Relative: 10 %
Neutro Abs: 3.2 10*3/uL (ref 1.7–7.7)
Neutrophils Relative %: 67 %
Platelets: 372 10*3/uL (ref 150–400)
RBC: 3.67 MIL/uL — ABNORMAL LOW (ref 3.87–5.11)
RDW: 20 % — ABNORMAL HIGH (ref 11.5–15.5)
WBC: 4.7 10*3/uL (ref 4.0–10.5)
nRBC: 0.4 % — ABNORMAL HIGH (ref 0.0–0.2)

## 2021-11-21 MED ORDER — ACETAMINOPHEN 325 MG PO TABS
650.0000 mg | ORAL_TABLET | Freq: Once | ORAL | Status: AC
  Administered 2021-11-21: 650 mg via ORAL
  Filled 2021-11-21: qty 2

## 2021-11-21 MED ORDER — FAMOTIDINE IN NACL 20-0.9 MG/50ML-% IV SOLN
20.0000 mg | Freq: Once | INTRAVENOUS | Status: AC
  Administered 2021-11-21: 20 mg via INTRAVENOUS
  Filled 2021-11-21: qty 50

## 2021-11-21 MED ORDER — SODIUM CHLORIDE 0.9 % IV SOLN
150.0000 mg | Freq: Once | INTRAVENOUS | Status: AC
  Administered 2021-11-21: 150 mg via INTRAVENOUS
  Filled 2021-11-21: qty 150

## 2021-11-21 MED ORDER — ATROPINE SULFATE 1 MG/ML IV SOLN
0.5000 mg | Freq: Once | INTRAVENOUS | Status: AC | PRN
  Administered 2021-11-21: 0.5 mg via INTRAVENOUS
  Filled 2021-11-21: qty 1

## 2021-11-21 MED ORDER — SODIUM CHLORIDE 0.9% FLUSH
10.0000 mL | INTRAVENOUS | Status: DC | PRN
  Administered 2021-11-21: 10 mL

## 2021-11-21 MED ORDER — HEPARIN SOD (PORK) LOCK FLUSH 100 UNIT/ML IV SOLN
500.0000 [IU] | Freq: Once | INTRAVENOUS | Status: AC | PRN
  Administered 2021-11-21: 500 [IU]

## 2021-11-21 MED ORDER — PALONOSETRON HCL INJECTION 0.25 MG/5ML
0.2500 mg | Freq: Once | INTRAVENOUS | Status: AC
  Administered 2021-11-21: 0.25 mg via INTRAVENOUS
  Filled 2021-11-21: qty 5

## 2021-11-21 MED ORDER — SODIUM CHLORIDE 0.9 % IV SOLN
10.0000 mg | Freq: Once | INTRAVENOUS | Status: AC
  Administered 2021-11-21: 10 mg via INTRAVENOUS
  Filled 2021-11-21: qty 10

## 2021-11-21 MED ORDER — DIPHENHYDRAMINE HCL 50 MG/ML IJ SOLN
50.0000 mg | Freq: Once | INTRAMUSCULAR | Status: AC
  Administered 2021-11-21: 50 mg via INTRAVENOUS
  Filled 2021-11-21: qty 1

## 2021-11-21 MED ORDER — SODIUM CHLORIDE 0.9 % IV SOLN
8.0000 mg/kg | Freq: Once | INTRAVENOUS | Status: AC
  Administered 2021-11-21: 830 mg via INTRAVENOUS
  Filled 2021-11-21: qty 83

## 2021-11-21 MED ORDER — SODIUM CHLORIDE 0.9 % IV SOLN: Freq: Once | INTRAVENOUS | Status: AC

## 2021-11-21 NOTE — Patient Instructions (Signed)
[facility]  Discharge Instructions: ?Thank you for choosing [facility] to provide your oncology and hematology care.  ? ?If you have a lab appointment with the [facility], please go directly to the [facility] and check in at the registration area. ?  ?Wear comfortable clothing and clothing appropriate for easy access to any Portacath or PICC line.  ? ?We strive to give you quality time with your provider. You may need to reschedule your appointment if you arrive late (15 or more minutes).  Arriving late affects you and other patients whose appointments are after yours.  Also, if you miss three or more appointments without notifying the office, you may be dismissed from the clinic at the provider?s discretion.    ?  ?For prescription refill requests, have your pharmacy contact our office and allow 72 hours for refills to be completed.   ? ?Today you received the following chemotherapy and/or immunotherapy agent: [name]    ?  ?To help prevent nausea and vomiting after your treatment, we encourage you to take your nausea medication as directed. ? ?BELOW ARE SYMPTOMS THAT SHOULD BE REPORTED IMMEDIATELY: ?*FEVER GREATER THAN 100.4 F (38 ?C) OR HIGHER ?*CHILLS OR SWEATING ?*NAUSEA AND VOMITING THAT IS NOT CONTROLLED WITH YOUR NAUSEA MEDICATION ?*UNUSUAL SHORTNESS OF BREATH ?*UNUSUAL BRUISING OR BLEEDING ?*URINARY PROBLEMS (pain or burning when urinating, or frequent urination) ?*BOWEL PROBLEMS (unusual diarrhea, constipation, pain near the anus) ?TENDERNESS IN MOUTH AND THROAT WITH OR WITHOUT PRESENCE OF ULCERS (sore throat, sores in mouth, or a toothache) ?UNUSUAL RASH, SWELLING OR PAIN  ?UNUSUAL VAGINAL DISCHARGE OR ITCHING  ? ?Items with * indicate a potential emergency and should be followed up as soon as possible or go to the Emergency Department if any problems should occur. ? ?Please show the CHEMOTHERAPY ALERT CARD or IMMUNOTHERAPY ALERT CARD at check-in to  the Emergency Department and triage nurse. ? ?Should you have questions after your visit or need to cancel or reschedule your appointment, please contact [facility]  Dept: [phone]  and follow the prompts.  Office hours are 8:00 a.m. to 4:30 p.m. Monday - Friday. Please note that voicemails left after 4:00 p.m. may not be returned until the following business day.  We are closed weekends and major holidays. You have access to a nurse at all times for urgent questions. Please call the main number to the clinic Dept: [phone] and follow the prompts. ? ? ?For any non-urgent questions, you may also contact your provider using MyChart. We now offer e-Visits for anyone 29 and older to request care online for non-urgent symptoms. For details visit [email or web address]. ?  ?Also download the MyChart app! Go to the app store, search "MyChart", open the app, select [facility], and log in with your MyChart username and password. ? ?Due to Covid, a mask is required upon entering the hospital/clinic. If you do not have a mask, one will be given to you upon arrival. For doctor visits, patients may have 1 support person aged 84 or older with them. For treatment visits, patients cannot have anyone with them due to current Covid guidelines and our immunocompromised population.  ? ?

## 2021-11-21 NOTE — Progress Notes (Signed)
[facility] Spiritual Care Note ? ?Followed up with Vicente Males in infusion today. She is working hard to live her best life as she navigates the limitations that come with cancer treatment and lupus. We talked about how diagnoses and treatment often lead to "recalculating," and how much power there is in leaning into what *is* possible after/while lamenting what is not currently or is no longer possible. Denene enjoyed a recent trip to [place], where her aunt competed in a triathlon. She is working to shift her vision for beach time this summer--which is a huge priority and source of joy--to honor the parameters her body needs (such as avoiding direct sun and overheating). ? ?[facility] and opportunity to have a reflective conversation partner. We plan to follow up at future treatments. ? ? ?Chaplain Lorrin Jackson, MDiv, [facility] ?Pager [phone] ?Voicemail [phone]  ?

## 2021-11-27 ENCOUNTER — Encounter: Payer: Self-pay | Admitting: General Practice

## 2021-11-27 ENCOUNTER — Inpatient Hospital Stay: Payer: Medicaid Other

## 2021-11-27 ENCOUNTER — Telehealth: Payer: Self-pay | Admitting: Hematology

## 2021-11-27 ENCOUNTER — Encounter: Payer: Self-pay | Admitting: Nurse Practitioner

## 2021-11-27 ENCOUNTER — Other Ambulatory Visit: Payer: Self-pay

## 2021-11-27 VITALS — BP 113/94 | HR 91 | Temp 97.8°F | Resp 20

## 2021-11-27 VITALS — BP 121/75 | HR 106 | Temp 98.3°F | Resp 18 | Wt 228.2 lb

## 2021-11-27 DIAGNOSIS — C50112 Malignant neoplasm of central portion of left female breast: Secondary | ICD-10-CM

## 2021-11-27 DIAGNOSIS — Z95828 Presence of other vascular implants and grafts: Secondary | ICD-10-CM

## 2021-11-27 DIAGNOSIS — Z5111 Encounter for antineoplastic chemotherapy: Secondary | ICD-10-CM | POA: Diagnosis not present

## 2021-11-27 LAB — CBC WITH DIFFERENTIAL/PLATELET
Abs Immature Granulocytes: 0.02 10*3/uL (ref 0.00–0.07)
Basophils Absolute: 0 10*3/uL (ref 0.0–0.1)
Basophils Relative: 0 %
Eosinophils Absolute: 0 10*3/uL (ref 0.0–0.5)
Eosinophils Relative: 0 %
HCT: 31.9 % — ABNORMAL LOW (ref 36.0–46.0)
Hemoglobin: 10.5 g/dL — ABNORMAL LOW (ref 12.0–15.0)
Immature Granulocytes: 1 %
Lymphocytes Relative: 25 %
Lymphs Abs: 0.7 10*3/uL (ref 0.7–4.0)
MCH: 30.7 pg (ref 26.0–34.0)
MCHC: 32.9 g/dL (ref 30.0–36.0)
MCV: 93.3 fL (ref 80.0–100.0)
Monocytes Absolute: 0.2 10*3/uL (ref 0.1–1.0)
Monocytes Relative: 6 %
Neutro Abs: 2 10*3/uL (ref 1.7–7.7)
Neutrophils Relative %: 68 %
Platelets: 347 10*3/uL (ref 150–400)
RBC: 3.42 MIL/uL — ABNORMAL LOW (ref 3.87–5.11)
RDW: 18.7 % — ABNORMAL HIGH (ref 11.5–15.5)
Smear Review: NORMAL
WBC: 2.9 10*3/uL — ABNORMAL LOW (ref 4.0–10.5)
nRBC: 0 % (ref 0.0–0.2)

## 2021-11-27 LAB — COMPREHENSIVE METABOLIC PANEL
ALT: 41 U/L (ref 0–44)
AST: 27 U/L (ref 15–41)
Albumin: 3.9 g/dL (ref 3.5–5.0)
Alkaline Phosphatase: 105 U/L (ref 38–126)
Anion gap: 4 — ABNORMAL LOW (ref 5–15)
BUN: 14 mg/dL (ref 6–20)
CO2: 29 mmol/L (ref 22–32)
Calcium: 9 mg/dL (ref 8.9–10.3)
Chloride: 106 mmol/L (ref 98–111)
Creatinine, Ser: 0.58 mg/dL (ref 0.44–1.00)
GFR, Estimated: >60 mL/min (ref >60)
Glucose, Bld: 114 mg/dL — ABNORMAL HIGH (ref 70–99)
Potassium: 3.8 mmol/L (ref 3.5–5.1)
Sodium: 139 mmol/L (ref 135–145)
Total Bilirubin: 0.5 mg/dL (ref 0.3–1.2)
Total Protein: 6.9 g/dL (ref 6.5–8.1)

## 2021-11-27 MED ORDER — SODIUM CHLORIDE 0.9 % IV SOLN
10.0000 mg | Freq: Once | INTRAVENOUS | Status: AC
  Administered 2021-11-27: 10 mg via INTRAVENOUS
  Filled 2021-11-27: qty 10

## 2021-11-27 MED ORDER — FAMOTIDINE IN NACL 20-0.9 MG/50ML-% IV SOLN
20.0000 mg | Freq: Once | INTRAVENOUS | Status: AC
  Administered 2021-11-27: 20 mg via INTRAVENOUS
  Filled 2021-11-27: qty 50

## 2021-11-27 MED ORDER — DIPHENHYDRAMINE HCL 50 MG/ML IJ SOLN
50.0000 mg | Freq: Once | INTRAMUSCULAR | Status: AC
  Administered 2021-11-27: 50 mg via INTRAVENOUS
  Filled 2021-11-27: qty 1

## 2021-11-27 MED ORDER — PALONOSETRON HCL INJECTION 0.25 MG/5ML
0.2500 mg | Freq: Once | INTRAVENOUS | Status: AC
  Administered 2021-11-27: 0.25 mg via INTRAVENOUS
  Filled 2021-11-27: qty 5

## 2021-11-27 MED ORDER — SODIUM CHLORIDE 0.9 % IV SOLN
8.0000 mg/kg | Freq: Once | INTRAVENOUS | Status: AC
  Administered 2021-11-27: 830 mg via INTRAVENOUS
  Filled 2021-11-27: qty 83

## 2021-11-27 MED ORDER — SODIUM CHLORIDE 0.9% FLUSH
10.0000 mL | INTRAVENOUS | Status: DC | PRN
  Administered 2021-11-27: 10 mL

## 2021-11-27 MED ORDER — ATROPINE SULFATE 1 MG/ML IV SOLN
0.5000 mg | Freq: Once | INTRAVENOUS | Status: AC | PRN
  Administered 2021-11-27: 0.5 mg via INTRAVENOUS
  Filled 2021-11-27: qty 1

## 2021-11-27 MED ORDER — HEPARIN SOD (PORK) LOCK FLUSH 100 UNIT/ML IV SOLN
500.0000 [IU] | Freq: Once | INTRAVENOUS | Status: AC | PRN
  Administered 2021-11-27: 500 [IU]

## 2021-11-27 MED ORDER — SODIUM CHLORIDE 0.9% FLUSH
10.0000 mL | Freq: Once | INTRAVENOUS | Status: AC
  Administered 2021-11-27: 10 mL

## 2021-11-27 MED ORDER — SODIUM CHLORIDE 0.9 % IV SOLN: Freq: Once | INTRAVENOUS | Status: AC

## 2021-11-27 MED ORDER — ACETAMINOPHEN 325 MG PO TABS
650.0000 mg | ORAL_TABLET | Freq: Once | ORAL | Status: AC
  Administered 2021-11-27: 650 mg via ORAL
  Filled 2021-11-27: qty 2

## 2021-11-27 MED ORDER — SODIUM CHLORIDE 0.9 % IV SOLN
150.0000 mg | Freq: Once | INTRAVENOUS | Status: AC
  Administered 2021-11-27: 150 mg via INTRAVENOUS
  Filled 2021-11-27: qty 150

## 2021-11-27 NOTE — Progress Notes (Signed)
Pt declined to stay for 30 minute observation period post infusion. VSS. No complaints at time of discharge.

## 2021-11-27 NOTE — Progress Notes (Signed)
[facility] Spiritual Care Note  Followed up with Vicente Males in infusion as planned, keeping visit brief as she was very drowsy from premeds. She reports that things are going as well as they could be, including the excitement of getting new furniture. We plan to follow up at her next treatment.   [place], [place], [facility] Pager [phone] Voicemail [phone]

## 2021-11-27 NOTE — Telephone Encounter (Signed)
Left message with follow-up appointments per 5/17 los.

## 2021-11-27 NOTE — Patient Instructions (Signed)
[facility]COL[facility]  Discharge Instructions: Thank you for choosing [facility] to provide your oncology and hematology care.   If you have a lab appointment with the Cancer Center, please go directly to the Cancer Center and check in at the registration area.   Wear comfortable clothing and clothing appropriate for easy access to any Portacath or PICC line.   We strive to give you quality time with your provider. You may need to reschedule your appointment if you arrive late (15 or more minutes).  Arriving late affects you and other patients whose appointments are after yours.  Also, if you miss three or more appointments without notifying the office, you may be dismissed from the clinic at the provider's discretion.      For prescription refill requests, have your pharmacy contact our office and allow 72 hours for refills to be completed.    Today you received the following chemotherapy and/or immunotherapy agents: Trodelvy.       To help prevent nausea and vomiting after your treatment, we encourage you to take your nausea medication as directed.  BELOW ARE SYMPTOMS THAT SHOULD BE REPORTED IMMEDIATELY: *FEVER GREATER THAN 100.4 F (38 C) OR HIGHER *CHILLS OR SWEATING *NAUSEA AND VOMITING THAT IS NOT CONTROLLED WITH YOUR NAUSEA MEDICATION *UNUSUAL SHORTNESS OF BREATH *UNUSUAL BRUISING OR BLEEDING *URINARY PROBLEMS (pain or burning when urinating, or frequent urination) *BOWEL PROBLEMS (unusual diarrhea, constipation, pain near the anus) TENDERNESS IN MOUTH AND THROAT WITH OR WITHOUT PRESENCE OF ULCERS (sore throat, sores in mouth, or a toothache) UNUSUAL RASH, SWELLING OR PAIN  UNUSUAL VAGINAL DISCHARGE OR ITCHING   Items with * indicate a potential emergency and should be followed up as soon as possible or go to the Emergency Department if any problems should occur.  Please show the CHEMOTHERAPY ALERT CARD or IMMUNOTHERAPY ALERT CARD at check-in to  the Emergency Department and triage nurse.  Should you have questions after your visit or need to cancel or reschedule your appointment, please contact [facility]  Dept: [phone]  and follow the prompts.  Office hours are 8:00 a.m. to 4:30 p.m. Monday - Friday. Please note that voicemails left after 4:00 p.m. may not be returned until the following business day.  We are closed weekends and major holidays. You have access to a nurse at all times for urgent questions. Please call the main number to the clinic Dept: [phone] and follow the prompts.   For any non-urgent questions, you may also contact your provider using MyChart. We now offer e-Visits for anyone 18 and older to request care online for non-urgent symptoms. For details visit [email or web address].   Also download the MyChart app! Go to the app store, search "MyChart", open the app, select [facility], and log in with your MyChart username and password.  Due to Covid, a mask is required upon entering the hospital/clinic. If you do not have a mask, one will be given to you upon arrival. For doctor visits, patients may have 1 support person aged 18 or older with them. For treatment visits, patients cannot have anyone with them due to current Covid guidelines and our immunocompromised population.  

## 2021-11-28 ENCOUNTER — Inpatient Hospital Stay: Payer: Medicaid Other

## 2021-11-28 VITALS — BP 121/89 | HR 87 | Temp 98.1°F | Resp 18

## 2021-11-28 DIAGNOSIS — Z5111 Encounter for antineoplastic chemotherapy: Secondary | ICD-10-CM | POA: Diagnosis not present

## 2021-11-28 DIAGNOSIS — Z171 Estrogen receptor negative status [ER-]: Secondary | ICD-10-CM

## 2021-11-28 LAB — CANCER ANTIGEN 27.29: CA 27.29: 24.7 U/mL (ref 0.0–38.6)

## 2021-11-28 MED ORDER — PEGFILGRASTIM-CBQV 6 MG/0.6ML ~~LOC~~ SOSY
6.0000 mg | PREFILLED_SYRINGE | Freq: Once | SUBCUTANEOUS | Status: AC
  Administered 2021-11-28: 6 mg via SUBCUTANEOUS
  Filled 2021-11-28: qty 0.6

## 2021-11-28 NOTE — Patient Instructions (Signed)

## 2021-12-04 ENCOUNTER — Other Ambulatory Visit: Payer: Self-pay | Admitting: Hematology

## 2021-12-05 ENCOUNTER — Ambulatory Visit (HOSPITAL_COMMUNITY)
Admission: RE | Admit: 2021-12-05 | Discharge: 2021-12-05 | Disposition: A | Discharge status: Home / Self Care | Payer: Medicaid Other | Source: Ambulatory Visit | Attending: Nurse Practitioner | Admitting: Nurse Practitioner

## 2021-12-05 DIAGNOSIS — Z171 Estrogen receptor negative status [ER-]: Secondary | ICD-10-CM | POA: Insufficient documentation

## 2021-12-05 DIAGNOSIS — C50112 Malignant neoplasm of central portion of left female breast: Secondary | ICD-10-CM

## 2021-12-05 IMAGING — MR MR HEAD WO/W CM
13 series · 48 of 48 positions shown · IV contrast (gadavist)
Comparison: PET scan [DATE]

CLINICAL DATA: New onset headache. Left-sided breast cancer.
Staging. Question brain metastases.

EXAM:
MRI HEAD WITHOUT AND WITH CONTRAST
TECHNIQUE: Multiplanar, multiecho pulse sequences of the brain and surrounding
structures were obtained without and with intravenous contrast.
CONTRAST:  10mL GADAVIST GADOBUTROL 1 MMOL/ML IV SOLN

[Series 5: DWI · axial · 3.0mm · 1.36mm/px · z∈[-37,+115]mm · 6 of 104 slices shown (1 of 2)]
[im 1/104]
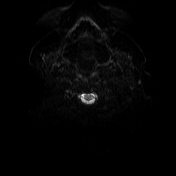
[im 21/104]
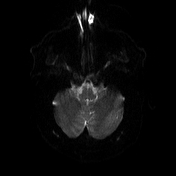
[im 42/104]
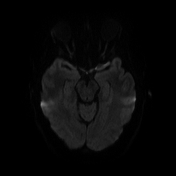
[im 62/104]
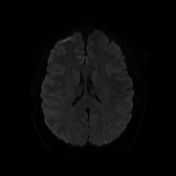
[im 83/104]
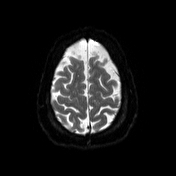
[im 104/104]
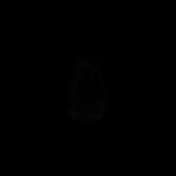

[Series 6: DWI · axial · 3.0mm · 1.36mm/px · z∈[-37,+115]mm · 3 of 52 slices shown (2 of 2)]
[im 1/52]
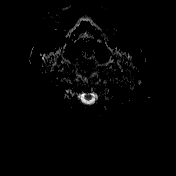
[im 26/52]
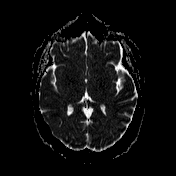
[im 52/52]
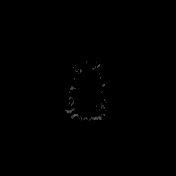

[Series 7: T1 · sagittal · 5.0mm · 0.75mm/px · 1 of 24 slices shown (1 of 2)]
[im 1/24]
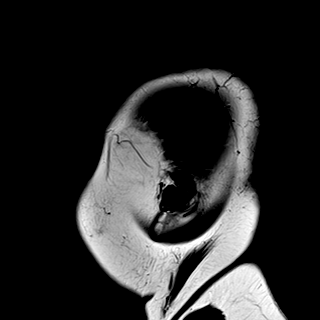

[Series 8: T2 · axial · 5.0mm · 0.62mm/px · z∈[-40,+120]mm · 2 of 26 slices shown]
[im 1/26]
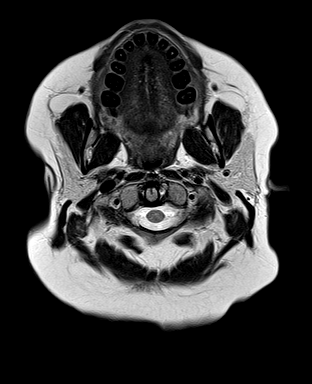
[im 26/26]
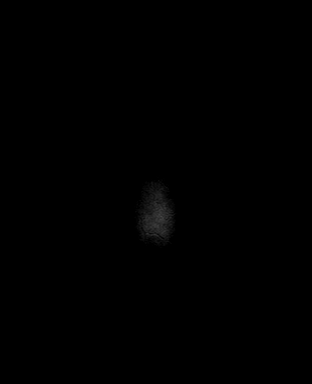

[Series 9: swi_images · axial · 3.0mm · 0.75mm/px · z∈[-41,+122]mm · 3 of 56 slices shown]
[im 1/56]
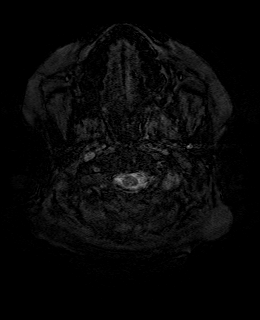
[im 28/56]
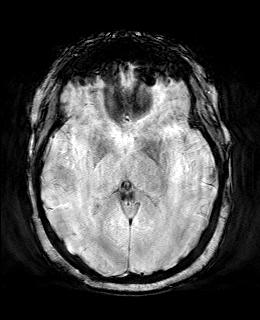
[im 56/56]
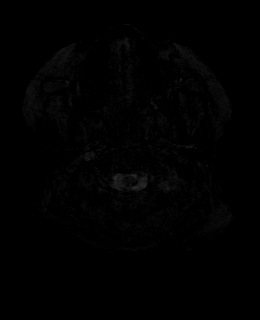

[Series 11: FLAIR · axial · 3.0mm · 0.75mm/px · z∈[-36,+116]mm · 3 of 52 slices shown]
[im 1/52]
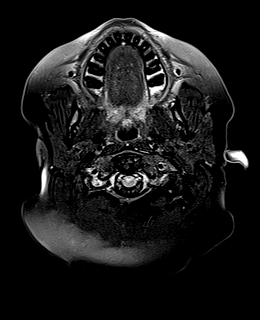
[im 26/52]
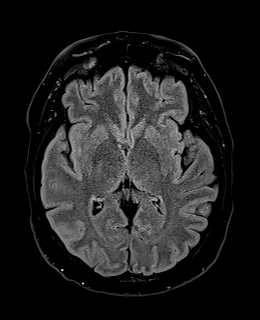
[im 52/52]
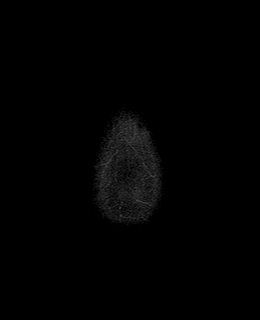

[Series 12: T1 · axial · 1.0mm · 0.94mm/px · z∈[-36,+121]mm · 10 of 160 slices shown (2 of 2)]
[im 1/160]
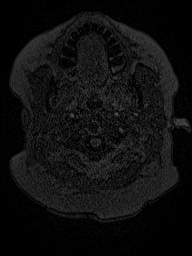
[im 18/160]
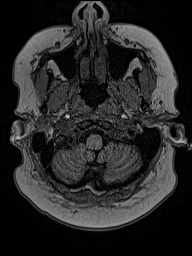
[im 36/160]
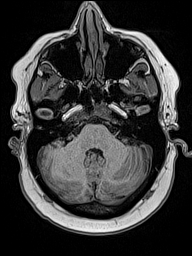
[im 54/160]
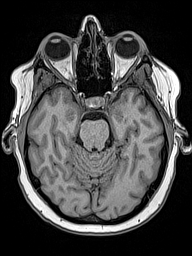
[im 71/160]
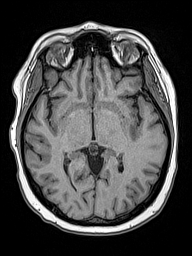
[im 89/160]
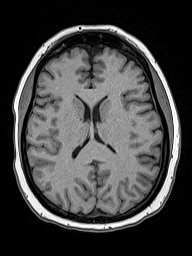
[im 107/160]
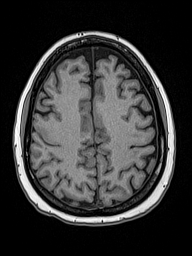
[im 124/160]
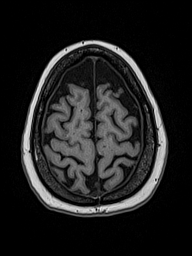
[im 142/160]
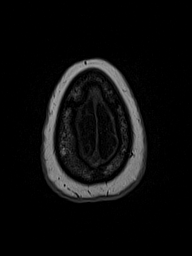
[im 160/160]
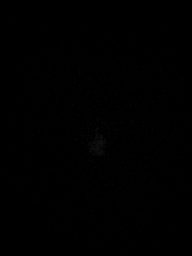

[Series 13: cor dwi_tracew · coronal · 5.0mm · 1.53mm/px · 3 of 56 slices shown]
[im 1/56]
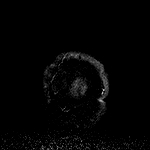
[im 28/56]
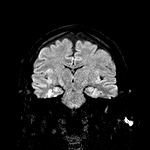
[im 56/56]
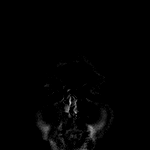

[Series 14: cor dwi_adc · coronal · 5.0mm · 1.53mm/px · 2 of 27 slices shown]
[im 1/27]
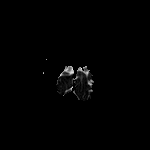
[im 27/27]
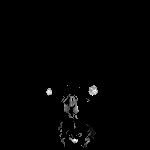

[Series 15: T2 post-contrast · coronal · 5.0mm · 0.57mm/px · 2 of 28 slices shown]
[im 1/28]
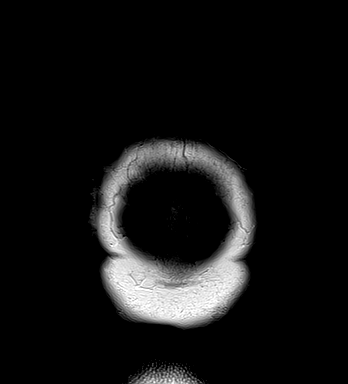
[im 28/28]
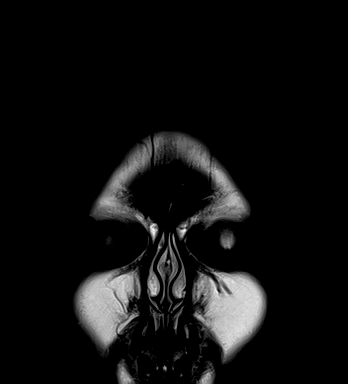

[Series 16: T1 post-contrast · axial · 1.0mm · 0.94mm/px · z∈[-36,+121]mm · 10 of 160 slices shown (1 of 3)]
[im 1/160]
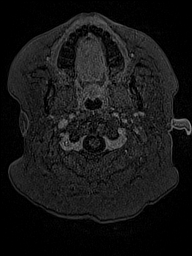
[im 18/160]
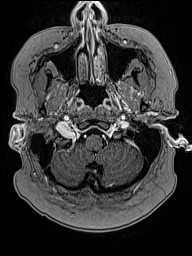
[im 36/160]
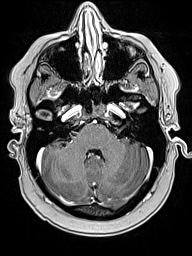
[im 54/160]
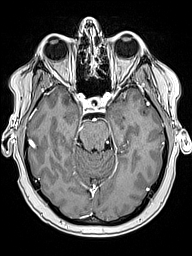
[im 71/160]
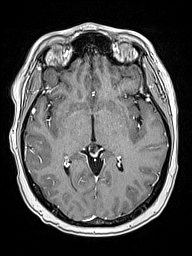
[im 89/160]
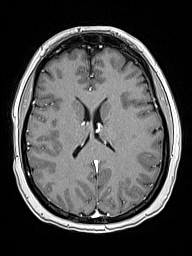
[im 107/160]
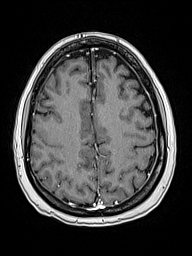
[im 124/160]
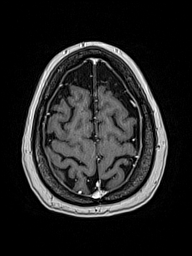
[im 142/160]
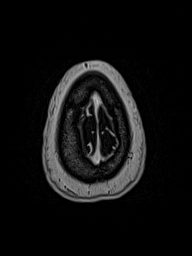
[im 160/160]
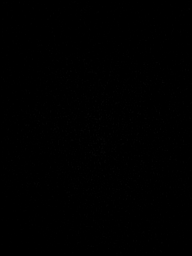

[Series 17: T1 post-contrast · coronal · 5.0mm · 0.43mm/px · 2 of 28 slices shown (2 of 3)]
[im 1/28]
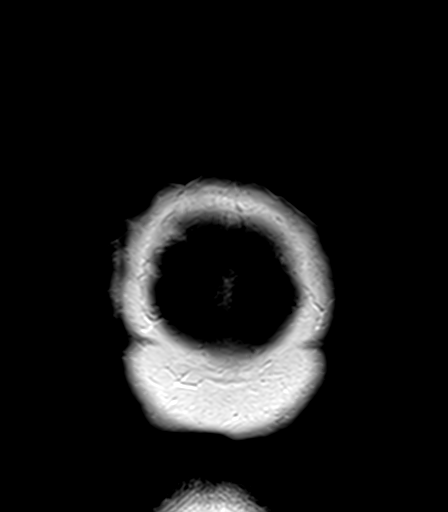
[im 28/28]
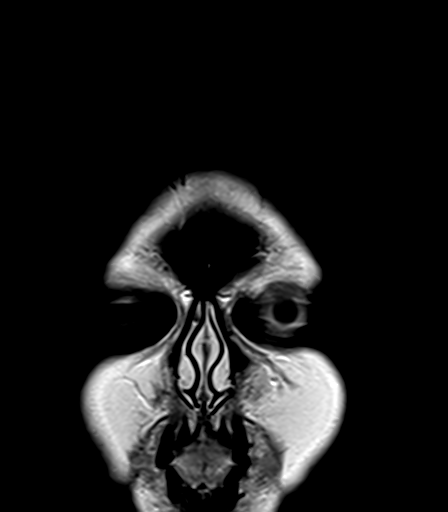

[Series 18: T1 post-contrast · sagittal · 5.0mm · 0.75mm/px · 1 of 24 slices shown (3 of 3)]
[im 1/24]
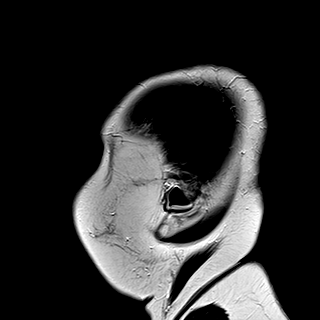

[48 of 48 positions shown; findings below may reference images not displayed]

FINDINGS: Brain: Normal appearance without evidence of old or acute
infarction, mass lesion, hemorrhage, hydrocephalus or extra-axial
collection. After contrast administration, no abnormal enhancement
occurs.

Vascular: Major vessels at the base of the brain show flow.

Skull and upper cervical spine: Negative

Sinuses/Orbits: Clear/normal

Other: None
IMPRESSION: Normal examination. No evidence of metastatic disease. No
abnormality seen to explain headache.

## 2021-12-05 MED ORDER — GADOBUTROL 1 MMOL/ML IV SOLN
10.0000 mL | Freq: Once | INTRAVENOUS | Status: AC | PRN
  Administered 2021-12-05: 10 mL via INTRAVENOUS

## 2021-12-11 MED FILL — Dexamethasone Sodium Phosphate Inj 100 MG/10ML: INTRAMUSCULAR | Qty: 1 | Status: AC

## 2021-12-11 MED FILL — Fosaprepitant Dimeglumine For IV Infusion 150 MG (Base Eq): INTRAVENOUS | Qty: 5 | Status: AC

## 2021-12-11 NOTE — Progress Notes (Signed)
[place]   [phone] Fax:([phone]) [phone]   Clinic Follow up Note   Patient Care Team: [name], MD as PCP - General (Family Medicine) Morrison Old, NP as PCP - Dermatology (Nurse Practitioner) Mauro Kaufmann, RN as Oncology Nurse Navigator Rockwell Germany, RN as Oncology Nurse Navigator [name], MD as Consulting Physician (General Surgery) Truitt Merle, MD as Consulting Physician (Hematology) Gery Pray, MD as Consulting Physician ([place]) Debby Bud, [place] (Physician Assistant) 12/11/2021  CHIEF COMPLAINT: F/u metastatic breast cancer   SUMMARY OF ONCOLOGIC HISTORY: Oncology History Overview Note  Cancer Staging Cancer of central portion of left female breast [facility]) Staging form: Breast, AJCC 8th Edition - Clinical stage from 03/08/2020: Stage IIIB (cT2, cN1, cM0, G3, ER-, PR-, HER2-) - Signed by Truitt Merle, MD on 03/10/2020 Stage prefix: Initial diagnosis Histologic grading system: 3 grade system - Pathologic stage from 09/20/2020: No Stage Recommended (ypT0, pN2a, cM0, G3, ER-, PR-, HER2-) - Signed by Gardenia Phlegm, NP on 10/04/2020 Stage prefix: Post-therapy Histologic grading system: 3 grade system    Cancer of central portion of left female breast ([place])  02/23/2020 Mammogram   Diagnostic Mammogram 02/23/20  IMPRESSION The 2x1x2.6cm irregular mass in teh left breast at 12:00 posiiton middle depth is highly suspicious of malignancy. An [place] is recommended for further evaluation and biopsy planning purposes.   02/23/2020 Breast US   [name] 02/23/20  IMPRESSION 2 adjacent spiculated masses in the left brast at 12:00 position 3 cm from the nipple (2.1x0.9x1.1cm and 1.1x1.4x0.5cm) is suggestive of malignancy.   Multiple abnormal left subpectoral and left axillary nodes measuring 3.3x2.1 cm concerning for metastatic adenopathy.    Left breast skin thickening and edema may be secondary congestive  edema due to extensive axillary adenopathy.    03/08/2020 Initial Biopsy   Diagnosis 1.Breast, left, needle core biopsy, 12:00 position, 3cmfn -INVASIVE DUCTAL CARCINOMA -SEE COMMENT  2. Lymph node, needle/core biopsy, left axilla -METASTATIC CARCINOMA INVOLVING A LYMPH NODE  -LYMPHOVASCULAR SPACE INVASION PRESENT   Microscopic Comment  1.Based on the biopsy the carcinoma appears Nottingham Grade 3 or 3 and measures 1 cm in the greatest linear extent.    03/08/2020 Receptors her2   ER- Negative 0% PR - Negative 0% HER2 - Negative  KI 67 - 80%    03/08/2020 Cancer Staging   Staging form: Breast, AJCC 8th Edition - Clinical stage from 03/08/2020: Stage IIIB (cT2, cN1, cM0, G3, ER-, PR-, HER2-) - Signed by Truitt Merle, MD on 03/10/2020    03/10/2020 Initial Diagnosis   Cancer of central portion of left female breast ([place])   03/16/2020 Breast MRI   IMPRESSION: 1. 8.1 x 7.8 x 6.6 cm biopsy proven invasive ductal carcinoma in the central right breast, involving 3 quadrants. 2. 3.0 x 1.7 x 1.1 cm satellite mass more inferiorly in lower inner quadrant of the left breast, compatible with additional malignancy. 3. Metastatic level 1 and level 2 left axillary lymph nodes. 4. No evidence of malignancy on the right.   03/17/2020 Imaging   IMPRESSION: CT CAP w contrast  1. Diffuse skin thickening in the left breast with left axillary and subpectoral lymphadenopathy, as well as a mildly enlarged left supraclavicular lymph node, which likely represents metastatic lymphadenopathy. No other definite extra nodal metastatic disease noted elsewhere in the chest, abdomen or pelvis. 2. Large mass in the central anatomic pelvis which is of uncertain origin, potentially a large exophytic fibroid or a large  solid mass arising from the right ovary. Further evaluation with pelvic ultrasound is strongly recommended.   03/21/2020 Imaging   Bone Scan  IMPRESSION: Apparent arthropathy at L5. No bony metastatic  disease is demonstrable on this study. Scattered foci of abnormal uptake in a pelvic mass is of uncertain etiology given absence of calcification in this mass by CT. This mass compresses the urinary bladder. It is possible that some of the increased uptake in this area actually represents physiologic uptake within the bladder.   Kidneys noted in flank positions bilaterally.     03/22/2020 - 08/16/2020 Neo-Adjuvant Chemotherapy   Neoadjuvant Adriamycin and Cytoxan q2weeks for 4 cycles starting 03/22/20-05/03/20 followed by weekly Taxol and Carboplatin for 12 weeks starting 05/17/20-08/16/20   03/24/2020 Imaging   US Pelvis  IMPRESSION: 1. Large pedunculated lesion directly contiguous with the uterine most suggestive of a large subserosal fibroid which measures up to 8.2 cm. 2. Additional 1 cm probable intramural fibroid in the right anterior uterine body. 3. No other acute or worrisome pelvic abnormality.   03/29/2020 Genetic Testing   Negative genetic testing on the common hereditary cancer panel.  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  The Common Hereditary Gene Panel offered by [facility] includes sequencing and/or deletion duplication testing of the following 47 genes: APC, ATM, AXIN2, BARD1, BMPR1A, BRCA1, BRCA2, BRIP1, CDH1, CDK4, CDKN2A (p14ARF), CDKN2A (p16INK4a), CHEK2, CTNNA1, DICER1, EPCAM (Deletion/duplication testing only), GREM1 (promoter region deletion/duplication testing only), KIT, MEN1, MLH1, MSH2, MSH3, MSH6, MUTYH, NBN, NF1, NHTL1, PALB2, PDGFRA, PMS2, POLD1, POLE, PTEN, RAD50, RAD51C, RAD51D, SDHB, SDHC, SDHD, SMAD4, SMARCA4. STK11, TP53, TSC1, TSC2, and VHL.  The following genes were evaluated for sequence changes only: SDHA and HOXB13 c.251G>A variant only. The report date is 03/29/2020   04/28/2020 - 10/27/2020 Antibody Plan   Added Keytruda q3weeks starting 04/28/20 to complete 1 year of treatment. Held since 10/27/20 due to skin rash and body aches.    08/17/2020 Breast MRI    IMPRESSION: 1. The biopsy proven malignancy in the left breast and the adjacent satellite lesion have resolved in the interval. No abnormalities in these locations today. 2. No MRI evidence of malignancy in the right breast. 3. No adenopathy identified today   09/20/2020 Surgery   LEFT MODIFIED RADICAL MASTECTOMY by Dr Donne Hazel   09/20/2020 Pathology Results   FINAL MICROSCOPIC DIAGNOSIS:   A. BREAST, LEFT, MODIFIED RADICAL MASTECTOMY:  - No residual invasive carcinoma in breast status post neoadjuvant  treatment.  - Metastatic carcinoma in (2) of (14) lymph nodes.  - Biopsy sites (one in breast, one in one of the positive lymph nodes).  - See oncology table.   B. ADDITIONAL AXILLARY CONTENTS, LEFT, DISSECTION:  - Metastatic carcinoma in (5) of (5) lymph nodes.    09/20/2020 Cancer Staging   Staging form: Breast, AJCC 8th Edition - Pathologic stage from 09/20/2020: No Stage Recommended (ypT0, pN2a, cM0, G3, ER-, PR-, HER2-) - Signed by Gardenia Phlegm, NP on 10/04/2020 Stage prefix: Post-therapy Histologic grading system: 3 grade system    10/30/2020 - 12/11/2020 Radiation Therapy   Adjuvant Radiation with Dr Sondra Come starting 10/30/20   10/30/2020 -  Chemotherapy   Adjuvant Xeloda starting 10/30/20 at 1570m BID M-F while on RT. Held since 11/20/2020 due to recurrent skin rash   01/18/2021 - 03/17/2021 Chemotherapy   Patient is on Treatment Plan : COLORECTAL 5FU / Leucovorin Modified [place] q14d x 12 cycles      03/28/2021 Imaging   CT CAP  IMPRESSION: 1. Status post left modified radical mastectomy and left axillary lymph node dissection, with no definitive findings to suggest metastatic disease in the chest, abdomen or pelvis on today's examination. Developing postradiation fibrosis in the apex of the left upper lobe is new compared to the prior study, but typical in the setting of prior axillary radiation therapy. 2. There appears to be 2 lesions in the uterus, one  of which appears slightly enlarged compared to the prior study, while the largest lesion on the prior examination has partially regressed, as detailed above. 3. Hepatic steatosis.   06/15/2021 Imaging   EXAM: CT CHEST, ABDOMEN, AND PELVIS WITH CONTRAST  IMPRESSION: 1. Stable exam. No evidence for metastatic disease in the chest, abdomen, or pelvis. 2. 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 3. Uterine fibroids.   08/28/2021 Pathology Results   Diagnosis  Skin, left chest wall, punch  - INFILTRATING CARCINOMA, CONSISTENT WITH BREAST ORIGIN Microscopic Description Sections show an infiltrating tumor composed of cords and strands of single cells arranged in a back-to-back pattern with gland-like structures. The individual tumor cells are pleomorphic with enlarged nuclei, small nucleoli and an increased mitotic index. The surrounding stroma is collagenous. No connection is seen between the tumor and the overlying dermis.  NOTE: The tumor cells are strongly positive for Cytokeratin 7 and GATA-3 staining. The combined histologic and immunohistochemical findings, in addition to the clinical history, support a diagnosis of breast carcinoma involving the skin.   09/13/2021 PET scan   IMPRESSION: 1. Hypermetabolic left level IV lower neck adenopathy and left supraclavicular and right axillary adenopathy compatible with active malignancy. 2. New nodularity or nodular airspace opacities in the apical segment left upper lobe, and in subpleural portions of the left upper lobe and adjacent left lower lobe with low to moderate activity. This could reflect findings related to interval radiation therapy or possibly pneumonia. Malignancy is less likely given the configuration but not entirely excluded, and surveillance is suggested. 3. The soft tissue mass arising from the right uterine fundus has changed its orientation compared to prior exams, and measures 8.1 cm in long axis.  This has mildly greater uptake than the myometrium with maximum SUV of 6.0. Prior characterization has suggested that this is likely a fibroid and the lesion does not appear to have substantially enlarged since earliest available comparison of 03/17/2020. Maximum SUV of 6.0 can be seen in uterine fibroids. The lesion is somewhat unusual in its size and appearance for fibroid, and surveillance is probably reasonable. 4. Accentuated activity along the vaginal vestibule/perineum, without visible CT abnormality, probably from slight urinary incontinence of urinary FDG. 5. High activity along the distal Port-A-Cath catheter, with calcifications in the SVC in the vicinity of this catheter raising suspicion for chronic mural thrombus. The very high activity around the catheter likely relates to stasis of injected FDG along fibrin sheath/chronic calcification.   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 Her ER/PR/HER2 markers were all negative.  Staging work-up negative for distant metastasis -baseline CA 27.29 normal, 28.7 (on 03/22/2020) -Caitlyn Hendricks completed neoadjuvant chemo with  ddAC every 2 weeks x4 03/22/2020 - 05/03/2020 followed by weekly carbo/Taxol 05/17/2020 - 08/16/2020 before proceeding with surgery  -Based on keynote 522 trial data, pembrolizumab q 3 weeks was added with neoadjuvant chemo starting 04/28/2020.  Due to drug rash this was not continued postoperatively and was stopped 10/2020 -Caitlyn Hendricks underwent left Mastectomy with Dr. Donne Hazel on 09/20/2020, path showed no residual invasive carcinoma in  the breast s/p neoadjuvant treatment but Caitlyn Hendricks had 7/19+ lymph nodes, which is associated with very high risk of recurrence -Caitlyn Hendricks completed adjuvant radiation with Dr. Sondra Come from 10/30/2020 - 12/11/2020 -Caitlyn Hendricks began adjuvant chemo with oral Xeloda for 6 months to reduce cancer recurrence risk on 10/30/2020, however Caitlyn Hendricks developed significant upper body skin rash and Xeloda on hold since the end of June/2022. -Skin biopsy from dermatologist showed drug reaction (11/17/2020).  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 and put on medication with good control of skin rash -Xeloda was changed to 5-FU on 01/18/2021 - 05/2021 -Caitlyn Hendricks developed skin erythema and nodularity to the left chest wall, punch biopsy 08/28/2021 revealed infiltrating carcinoma consistent with breast primary, HER2 negative -Staging PET scan 09/13/2021 showed hypermetabolic left level IV, left supraclavicular, and right axillary adenopathy consistent with metastatic disease; new nodularity in apical segment left upper lobe and and subpleural portions of the left upper and left lower lobes, more concerning for infection than malignancy lymph nodes -Caitlyn Hendricks started [place]ples on 09/17/2021, left breast erythema and nodularity improved after cycle 1 -Caitlyn Hendricks developed C. difficile and was hospitalized after cycle 1, Caitlyn Hendricks recovered well.  Cycle 2 was dose reduced  CURRENT THERAPY: [name], started 09/17/21  INTERVAL HISTORY: Caitlyn Hendricks returns today for follow-up and treatment as scheduled.  Caitlyn Hendricks continues to tolerate treatments well.  Reports some left breast discomfort and sharp pains which Caitlyn Hendricks feels is related to scar tissue and previous surgery.  States that Caitlyn Hendricks has noticed some swelling in her right chest wall extending into her shoulder and collarbone that Caitlyn Hendricks is concerned may be cancer.  States it does not feel muscular but more "solid". Felt like this area improved with cycle 1 but has returned and worsened with subsequent cycles.  Diarrhea has improved and  is manageable with Lomotil.  Denies additional headaches since her brain MRI.  Continues to have a rash at left mastectomy site that Caitlyn Hendricks feels is lupus.  Is not bothersome.  Review of Systems  Gastrointestinal:  Positive for diarrhea.  Musculoskeletal:  Positive for myalgias.   MEDICAL HISTORY:  Past Medical History:  Diagnosis Date   Anemia    Anxiety    Arthritis    Cancer ([place])    Left Breast   Depression    GERD (gastroesophageal reflux disease)    Headache    History of radiation therapy 10/30/20-12/11/20   left chest wall and axilla - Dr. Gery Pray   Panic attack     SURGICAL HISTORY: Past Surgical History:  Procedure Laterality Date   MASTECTOMY MODIFIED RADICAL Left 09/20/2020   Procedure: LEFT MODIFIED RADICAL MASTECTOMY;  Surgeon: [name], MD;  Location: [place];  Service: General;  Laterality: Left;  RNFA; PEC BLOCK;   PORTACATH PLACEMENT Right 03/21/2020   Procedure: INSERTION PORT-A-CATH WITH ULTRASOUND GUIDANCE;  Surgeon: [name], MD;  Location: [place];  Service: General;  Laterality: Right;    I have reviewed the social history and family history with the patient and they are unchanged from previous note.  ALLERGIES:  is allergic to medroxyprogesterone and xeloda [capecitabine].  MEDICATIONS:  Current Outpatient Medications  Medication Sig Dispense Refill   ALPRAZolam (XANAX) 1 MG tablet Take 1 mg by mouth 3 (three) times daily as needed for anxiety.      baclofen (LIORESAL) 10 MG tablet TAKE 1 TABLET BY MOUTH TWICE A DAY AS NEEDED FOR MUSCLE SPASM 60 tablet 0   dexamethasone (DECADRON) 4 MG tablet Take 2 tablets (8 mg) daily for 3 days after chemotherapy. Take with food. (Patient not taking: Reported on 09/28/2021) 30 tablet 1   diphenhydrAMINE (BENADRYL) 25 mg capsule Take 25 mg by mouth every 6 (six) hours as needed for allergies. (Patient not taking: Reported on 06/13/2021)     diphenhydrAMINE HCl (ZZZQUIL) 50 MG/30ML  LIQD Take 30 mLs by mouth at bedtime as needed (sleep). (Patient not taking: Reported on 06/13/2021)     diphenoxylate-atropine (LOMOTIL) 2.5-0.025 MG tablet Take 1-2 tablets by mouth 4 (four) times daily as needed for diarrhea or loose stools. (Patient not taking: Reported on 09/28/2021) 45 tablet 1   Emollient (EUCERIN) lotion Apply 1 application. topically as needed for dry skin (itchyness (red cap)).     hydroxychloroquine (PLAQUENIL) 200 MG tablet Take 200 mg by mouth 2 (two) times daily.     ibuprofen (ADVIL) 200 MG tablet Take 800 mg by mouth every 8 (eight) hours as needed for moderate pain.     Ibuprofen-diphenhydrAMINE Cit (ADVIL PM PO) Take 1 capsule by mouth at bedtime as needed (sleep).     lidocaine-prilocaine (EMLA) cream APPLY TO AFFECTED AREA ONCE AS NEEDED (PORT ACCESS) (Patient taking differently: Apply 1 application. topically as needed (port access).) 30 g 3   lidocaine-prilocaine (EMLA) cream Apply 1 application. topically as needed. 30 g 0   pantoprazole (PROTONIX) 20 MG tablet TAKE 1 TABLET BY MOUTH EVERY DAY 30 tablet 0   prochlorperazine (COMPAZINE) 10 MG tablet Take 1 tablet (10 mg total) by mouth every 6 (six) hours as needed for nausea or vomiting. 30 tablet 1   sertraline (ZOLOFT) 50 MG tablet Take 50 mg by mouth every morning.     No current facility-administered medications for this visit.   Facility-Administered Medications Ordered in Other Visits  Medication Dose Route Frequency Provider Last Rate Last Admin   sodium chloride flush (NS) 0.9 % injection 10 mL  10 mL Intravenous PRN Alla Feeling, NP   10 mL at 03/28/20 1104    PHYSICAL EXAMINATION: ECOG PERFORMANCE STATUS: 1 - Symptomatic but completely ambulatory  There were no vitals filed for this visit.  There were no vitals filed for this visit.  Physical Exam Constitutional:      Appearance: Caitlyn Hendricks is obese.  Cardiovascular:     Rate and Rhythm: Normal rate and regular rhythm.  Pulmonary:      Effort: Pulmonary effort is normal.     Breath sounds: Normal breath sounds.  Chest:  Breasts:    Left: Swelling and tenderness present.  Neurological:     Mental Status: Caitlyn Hendricks is alert and oriented to person, place, and time.     LABORATORY DATA:  I have reviewed the data as listed    Latest Ref Rng & Units 11/27/2021    9:47 AM 11/21/2021    8:22 AM 11/06/2021    8:26 AM  CBC  WBC 4.0 - 10.5 K/uL 2.9   4.7   2.8    Hemoglobin 12.0 - 15.0 g/dL 10.5   11.1   10.9    Hematocrit 36.0 - 46.0 % 31.9  34.5   32.7    Platelets 150 - 400 K/uL 347   372   343          Latest Ref Rng & Units 11/27/2021    9:47 AM 11/21/2021    8:22 AM 11/06/2021    8:26 AM  CMP  Glucose 70 - 99 mg/dL 114   112   115    BUN 6 - 20 mg/dL 14   18   14     Creatinine 0.44 - 1.00 mg/dL 0.58   0.67   0.71    Sodium 135 - 145 mmol/L 139   137   142    Potassium 3.5 - 5.1 mmol/L 3.8   4.4   3.7    Chloride 98 - 111 mmol/L 106   105   104    CO2 22 - 32 mmol/L 29   25   28     Calcium 8.9 - 10.3 mg/dL 9.0   9.0   9.3    Total Protein 6.5 - 8.1 g/dL 6.9   6.9   7.2    Total Bilirubin 0.3 - 1.2 mg/dL 0.5   0.2   0.3    Alkaline Phos 38 - 126 U/L 105   129   120    AST 15 - 41 U/L 27   23   32    ALT 0 - 44 U/L 41   35   45        RADIOGRAPHIC STUDIES: I have personally reviewed the radiological images as listed and agreed with the findings in the report. No results found.   ASSESSMENT & PLAN: Caitlyn Hendricks is a 41 y.o. premenopausal female     1.  Cancer of the central portion of the left female breast, invasive ductal carcinoma,  cT2N1Mx, ER-/PR-/HER2-, Grade III -Caitlyn Hendricks appears stable.  Caitlyn Hendricks completed cycle 2 dose reduced Trodelvy and G-CSF.  Caitlyn Hendricks tolerated mostly well except moderate diarrhea after week 2, this resolved with Lomotil.  -Caitlyn Hendricks has a new rash eruption at the mastectomy scar. Pt also has cutaneous lupus. Chest wall nodularity initially improved after cycle 1 but patient does not feel it has  improved much more than that.  Caitlyn Hendricks thinks the skin firmness is extending to the abdominal wall, and left chest/axillary pains are more frequent. Has come concern for progressive cancer and would like a scan.  -Labs reviewed.  -proceed with C4 trodelvy today as planned.  -plan for pet scan (if insurance approves) prior to next cycle.   2. Headache/sharp head pain  -new onset 1 month h/o sharp left head pain that radiates to her eye.  -Caitlyn Hendricks had migraines with aura in the past, this is not the same. -We reviewed potential benign etiologies including medication SE, dehydration, stress, or other headache syndrome.  -However given her triple negative disease and other metastasis Caitlyn Hendricks was referred for MRI of brain which was negative.   3. Diarrhea -Caitlyn Hendricks had C dif after cycle 1 trodelvy, this eventually resolved -Caitlyn Hendricks had diarrhea recurrence with dose reduced C2, eventually resolved with lomotil. [name] further dose reduced for C2 for persistent diarrhea.  -Diarrhea has improved since last cycle. -Continue Lomotil.  We will send refill today.   4. Genetic Testing -Given her young age, Her MGM's breast cancer and father prostate cancer, Caitlyn Hendricks is eligible for genetic testing. 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 Anxiety/Depression, Social Support -Caitlyn Hendricks is  currently on Xanax 3m TID and Celexa 561m This is managed by her PCP. -Good support from family and significant other   PLAN: -Labs reviewed -Proceed with C4 D1 trodelvy -C4 D8 6/14 and GCSF 6/15 -Pet scan prior to next cycle  -RTC in 3 weeks for C5D1, labs and to discuss PET scan results.   I spent 25 minutes dedicated to the care of this patient (face-to-face and non-face-to-face) on the date of the encounter to include what is described in the assessment and plan.   No orders of the defined types were placed in this encounter.      JeJacquelin HawkingNP 12/11/21

## 2021-12-12 ENCOUNTER — Inpatient Hospital Stay: Payer: Medicaid Other | Attending: Hematology

## 2021-12-12 ENCOUNTER — Inpatient Hospital Stay: Payer: Medicaid Other

## 2021-12-12 ENCOUNTER — Inpatient Hospital Stay (HOSPITAL_BASED_OUTPATIENT_CLINIC_OR_DEPARTMENT_OTHER): Payer: Medicaid Other | Admitting: Oncology

## 2021-12-12 ENCOUNTER — Other Ambulatory Visit: Payer: Self-pay

## 2021-12-12 VITALS — BP 116/87 | HR 91 | Temp 97.9°F | Resp 17 | Wt 230.7 lb

## 2021-12-12 DIAGNOSIS — Z95828 Presence of other vascular implants and grafts: Secondary | ICD-10-CM

## 2021-12-12 DIAGNOSIS — R918 Other nonspecific abnormal finding of lung field: Secondary | ICD-10-CM | POA: Insufficient documentation

## 2021-12-12 DIAGNOSIS — L930 Discoid lupus erythematosus: Secondary | ICD-10-CM | POA: Insufficient documentation

## 2021-12-12 DIAGNOSIS — C778 Secondary and unspecified malignant neoplasm of lymph nodes of multiple regions: Secondary | ICD-10-CM | POA: Insufficient documentation

## 2021-12-12 DIAGNOSIS — Z171 Estrogen receptor negative status [ER-]: Secondary | ICD-10-CM

## 2021-12-12 DIAGNOSIS — C50112 Malignant neoplasm of central portion of left female breast: Secondary | ICD-10-CM

## 2021-12-12 DIAGNOSIS — Z5189 Encounter for other specified aftercare: Secondary | ICD-10-CM | POA: Diagnosis not present

## 2021-12-12 DIAGNOSIS — K219 Gastro-esophageal reflux disease without esophagitis: Secondary | ICD-10-CM | POA: Diagnosis not present

## 2021-12-12 DIAGNOSIS — C50812 Malignant neoplasm of overlapping sites of left female breast: Secondary | ICD-10-CM | POA: Diagnosis not present

## 2021-12-12 DIAGNOSIS — Z923 Personal history of irradiation: Secondary | ICD-10-CM | POA: Insufficient documentation

## 2021-12-12 DIAGNOSIS — D259 Leiomyoma of uterus, unspecified: Secondary | ICD-10-CM | POA: Diagnosis not present

## 2021-12-12 DIAGNOSIS — Z9012 Acquired absence of left breast and nipple: Secondary | ICD-10-CM | POA: Diagnosis not present

## 2021-12-12 DIAGNOSIS — R197 Diarrhea, unspecified: Secondary | ICD-10-CM | POA: Insufficient documentation

## 2021-12-12 DIAGNOSIS — Z5111 Encounter for antineoplastic chemotherapy: Secondary | ICD-10-CM | POA: Insufficient documentation

## 2021-12-12 DIAGNOSIS — Z79899 Other long term (current) drug therapy: Secondary | ICD-10-CM | POA: Insufficient documentation

## 2021-12-12 LAB — CBC WITH DIFFERENTIAL/PLATELET
Abs Immature Granulocytes: 0.04 10*3/uL (ref 0.00–0.07)
Basophils Absolute: 0 10*3/uL (ref 0.0–0.1)
Basophils Relative: 1 %
Eosinophils Absolute: 0.1 10*3/uL (ref 0.0–0.5)
Eosinophils Relative: 1 %
HCT: 33.9 % — ABNORMAL LOW (ref 36.0–46.0)
Hemoglobin: 10.9 g/dL — ABNORMAL LOW (ref 12.0–15.0)
Immature Granulocytes: 1 %
Lymphocytes Relative: 19 %
Lymphs Abs: 1 10*3/uL (ref 0.7–4.0)
MCH: 31.2 pg (ref 26.0–34.0)
MCHC: 32.2 g/dL (ref 30.0–36.0)
MCV: 97.1 fL (ref 80.0–100.0)
Monocytes Absolute: 0.6 10*3/uL (ref 0.1–1.0)
Monocytes Relative: 10 %
Neutro Abs: 3.8 10*3/uL (ref 1.7–7.7)
Neutrophils Relative %: 68 %
Platelets: 337 10*3/uL (ref 150–400)
RBC: 3.49 MIL/uL — ABNORMAL LOW (ref 3.87–5.11)
RDW: 20 % — ABNORMAL HIGH (ref 11.5–15.5)
WBC: 5.5 10*3/uL (ref 4.0–10.5)
nRBC: 0 % (ref 0.0–0.2)

## 2021-12-12 LAB — COMPREHENSIVE METABOLIC PANEL
ALT: 35 U/L (ref 0–44)
AST: 26 U/L (ref 15–41)
Albumin: 4 g/dL (ref 3.5–5.0)
Alkaline Phosphatase: 132 U/L — ABNORMAL HIGH (ref 38–126)
Anion gap: 9 (ref 5–15)
BUN: 22 mg/dL — ABNORMAL HIGH (ref 6–20)
CO2: 24 mmol/L (ref 22–32)
Calcium: 9.4 mg/dL (ref 8.9–10.3)
Chloride: 105 mmol/L (ref 98–111)
Creatinine, Ser: 0.64 mg/dL (ref 0.44–1.00)
GFR, Estimated: >60 mL/min (ref >60)
Glucose, Bld: 104 mg/dL — ABNORMAL HIGH (ref 70–99)
Potassium: 4.2 mmol/L (ref 3.5–5.1)
Sodium: 138 mmol/L (ref 135–145)
Total Bilirubin: 0.3 mg/dL (ref 0.3–1.2)
Total Protein: 6.8 g/dL (ref 6.5–8.1)

## 2021-12-12 MED ORDER — SODIUM CHLORIDE 0.9% FLUSH
10.0000 mL | INTRAVENOUS | Status: DC | PRN
  Administered 2021-12-12: 10 mL

## 2021-12-12 MED ORDER — FAMOTIDINE IN NACL 20-0.9 MG/50ML-% IV SOLN
20.0000 mg | Freq: Once | INTRAVENOUS | Status: AC
  Administered 2021-12-12: 20 mg via INTRAVENOUS
  Filled 2021-12-12: qty 50

## 2021-12-12 MED ORDER — DIPHENHYDRAMINE HCL 50 MG/ML IJ SOLN
50.0000 mg | Freq: Once | INTRAMUSCULAR | Status: AC
  Administered 2021-12-12: 50 mg via INTRAVENOUS
  Filled 2021-12-12: qty 1

## 2021-12-12 MED ORDER — SODIUM CHLORIDE 0.9 % IV SOLN
150.0000 mg | Freq: Once | INTRAVENOUS | Status: AC
  Administered 2021-12-12: 150 mg via INTRAVENOUS
  Filled 2021-12-12: qty 150

## 2021-12-12 MED ORDER — SODIUM CHLORIDE 0.9 % IV SOLN
8.0000 mg/kg | Freq: Once | INTRAVENOUS | Status: AC
  Administered 2021-12-12: 830 mg via INTRAVENOUS
  Filled 2021-12-12: qty 83

## 2021-12-12 MED ORDER — SODIUM CHLORIDE 0.9 % IV SOLN
10.0000 mg | Freq: Once | INTRAVENOUS | Status: AC
  Administered 2021-12-12: 10 mg via INTRAVENOUS
  Filled 2021-12-12: qty 10

## 2021-12-12 MED ORDER — ATROPINE SULFATE 1 MG/ML IV SOLN
0.5000 mg | Freq: Once | INTRAVENOUS | Status: AC | PRN
  Administered 2021-12-12: 0.5 mg via INTRAVENOUS
  Filled 2021-12-12: qty 1

## 2021-12-12 MED ORDER — PALONOSETRON HCL INJECTION 0.25 MG/5ML
0.2500 mg | Freq: Once | INTRAVENOUS | Status: AC
  Administered 2021-12-12: 0.25 mg via INTRAVENOUS
  Filled 2021-12-12: qty 5

## 2021-12-12 MED ORDER — SODIUM CHLORIDE 0.9 % IV SOLN: Freq: Once | INTRAVENOUS | Status: AC

## 2021-12-12 MED ORDER — SODIUM CHLORIDE 0.9% FLUSH
10.0000 mL | Freq: Once | INTRAVENOUS | Status: AC
  Administered 2021-12-12: 10 mL

## 2021-12-12 MED ORDER — ACETAMINOPHEN 325 MG PO TABS
650.0000 mg | ORAL_TABLET | Freq: Once | ORAL | Status: AC
  Administered 2021-12-12: 650 mg via ORAL
  Filled 2021-12-12: qty 2

## 2021-12-12 MED ORDER — HEPARIN SOD (PORK) LOCK FLUSH 100 UNIT/ML IV SOLN
500.0000 [IU] | Freq: Once | INTRAVENOUS | Status: AC | PRN
  Administered 2021-12-12: 500 [IU]

## 2021-12-13 ENCOUNTER — Encounter: Payer: Self-pay | Admitting: Hematology

## 2021-12-18 MED FILL — Dexamethasone Sodium Phosphate Inj 100 MG/10ML: INTRAMUSCULAR | Qty: 1 | Status: AC

## 2021-12-18 MED FILL — Fosaprepitant Dimeglumine For IV Infusion 150 MG (Base Eq): INTRAVENOUS | Qty: 5 | Status: AC

## 2021-12-19 ENCOUNTER — Inpatient Hospital Stay: Payer: Medicaid Other

## 2021-12-19 ENCOUNTER — Other Ambulatory Visit: Payer: Self-pay

## 2021-12-19 VITALS — BP 140/93 | HR 83 | Temp 98.2°F | Resp 16 | Wt 229.5 lb

## 2021-12-19 DIAGNOSIS — Z95828 Presence of other vascular implants and grafts: Secondary | ICD-10-CM

## 2021-12-19 DIAGNOSIS — C50112 Malignant neoplasm of central portion of left female breast: Secondary | ICD-10-CM

## 2021-12-19 DIAGNOSIS — Z5111 Encounter for antineoplastic chemotherapy: Secondary | ICD-10-CM | POA: Diagnosis not present

## 2021-12-19 LAB — COMPREHENSIVE METABOLIC PANEL
ALT: 38 U/L (ref 0–44)
AST: 23 U/L (ref 15–41)
Albumin: 4 g/dL (ref 3.5–5.0)
Alkaline Phosphatase: 125 U/L (ref 38–126)
Anion gap: 9 (ref 5–15)
BUN: 16 mg/dL (ref 6–20)
CO2: 27 mmol/L (ref 22–32)
Calcium: 9.5 mg/dL (ref 8.9–10.3)
Chloride: 103 mmol/L (ref 98–111)
Creatinine, Ser: 0.72 mg/dL (ref 0.44–1.00)
GFR, Estimated: >60 mL/min (ref >60)
Glucose, Bld: 109 mg/dL — ABNORMAL HIGH (ref 70–99)
Potassium: 3.7 mmol/L (ref 3.5–5.1)
Sodium: 139 mmol/L (ref 135–145)
Total Bilirubin: 0.6 mg/dL (ref 0.3–1.2)
Total Protein: 6.8 g/dL (ref 6.5–8.1)

## 2021-12-19 LAB — CBC WITH DIFFERENTIAL/PLATELET
Abs Immature Granulocytes: 0.02 10*3/uL (ref 0.00–0.07)
Basophils Absolute: 0 10*3/uL (ref 0.0–0.1)
Basophils Relative: 1 %
Eosinophils Absolute: 0 10*3/uL (ref 0.0–0.5)
Eosinophils Relative: 1 %
HCT: 31.9 % — ABNORMAL LOW (ref 36.0–46.0)
Hemoglobin: 10.4 g/dL — ABNORMAL LOW (ref 12.0–15.0)
Immature Granulocytes: 1 %
Lymphocytes Relative: 22 %
Lymphs Abs: 0.7 10*3/uL (ref 0.7–4.0)
MCH: 31.4 pg (ref 26.0–34.0)
MCHC: 32.6 g/dL (ref 30.0–36.0)
MCV: 96.4 fL (ref 80.0–100.0)
Monocytes Absolute: 0.4 10*3/uL (ref 0.1–1.0)
Monocytes Relative: 12 %
Neutro Abs: 1.9 10*3/uL (ref 1.7–7.7)
Neutrophils Relative %: 63 %
Platelets: 364 10*3/uL (ref 150–400)
RBC: 3.31 MIL/uL — ABNORMAL LOW (ref 3.87–5.11)
RDW: 17.7 % — ABNORMAL HIGH (ref 11.5–15.5)
WBC: 3 10*3/uL — ABNORMAL LOW (ref 4.0–10.5)
nRBC: 0 % (ref 0.0–0.2)

## 2021-12-19 MED ORDER — SODIUM CHLORIDE 0.9 % IV SOLN
8.0000 mg/kg | Freq: Once | INTRAVENOUS | Status: AC
  Administered 2021-12-19: 830 mg via INTRAVENOUS
  Filled 2021-12-19: qty 83

## 2021-12-19 MED ORDER — SODIUM CHLORIDE 0.9 % IV SOLN
10.0000 mg | Freq: Once | INTRAVENOUS | Status: AC
  Administered 2021-12-19: 10 mg via INTRAVENOUS
  Filled 2021-12-19: qty 10

## 2021-12-19 MED ORDER — PALONOSETRON HCL INJECTION 0.25 MG/5ML
0.2500 mg | Freq: Once | INTRAVENOUS | Status: AC
  Administered 2021-12-19: 0.25 mg via INTRAVENOUS
  Filled 2021-12-19: qty 5

## 2021-12-19 MED ORDER — SODIUM CHLORIDE 0.9% FLUSH
10.0000 mL | INTRAVENOUS | Status: DC | PRN
  Administered 2021-12-19: 10 mL

## 2021-12-19 MED ORDER — SODIUM CHLORIDE 0.9% FLUSH
10.0000 mL | Freq: Once | INTRAVENOUS | Status: AC
  Administered 2021-12-19: 10 mL

## 2021-12-19 MED ORDER — SODIUM CHLORIDE 0.9 % IV SOLN: Freq: Once | INTRAVENOUS | Status: AC

## 2021-12-19 MED ORDER — HEPARIN SOD (PORK) LOCK FLUSH 100 UNIT/ML IV SOLN
500.0000 [IU] | Freq: Once | INTRAVENOUS | Status: AC | PRN
  Administered 2021-12-19: 500 [IU]

## 2021-12-19 MED ORDER — DIPHENHYDRAMINE HCL 50 MG/ML IJ SOLN
50.0000 mg | Freq: Once | INTRAMUSCULAR | Status: AC
  Administered 2021-12-19: 50 mg via INTRAVENOUS
  Filled 2021-12-19: qty 1

## 2021-12-19 MED ORDER — ATROPINE SULFATE 1 MG/ML IV SOLN
0.5000 mg | Freq: Once | INTRAVENOUS | Status: AC | PRN
  Administered 2021-12-19: 0.5 mg via INTRAVENOUS
  Filled 2021-12-19: qty 1

## 2021-12-19 MED ORDER — FAMOTIDINE IN NACL 20-0.9 MG/50ML-% IV SOLN
20.0000 mg | Freq: Once | INTRAVENOUS | Status: AC
  Administered 2021-12-19: 20 mg via INTRAVENOUS
  Filled 2021-12-19: qty 50

## 2021-12-19 MED ORDER — SODIUM CHLORIDE 0.9 % IV SOLN
150.0000 mg | Freq: Once | INTRAVENOUS | Status: AC
  Administered 2021-12-19: 150 mg via INTRAVENOUS
  Filled 2021-12-19: qty 150

## 2021-12-19 MED ORDER — ACETAMINOPHEN 325 MG PO TABS
650.0000 mg | ORAL_TABLET | Freq: Once | ORAL | Status: AC
  Administered 2021-12-19: 650 mg via ORAL
  Filled 2021-12-19: qty 2

## 2021-12-19 NOTE — Patient Instructions (Addendum)
[facility]  Discharge Instructions: Thank you for choosing [facility] to provide your oncology and hematology care.   If you have a lab appointment with the Cancer Center, please go directly to the Cancer Center and check in at the registration area.   Wear comfortable clothing and clothing appropriate for easy access to any Portacath or PICC line.   We strive to give you quality time with your provider. You may need to reschedule your appointment if you arrive late (15 or more minutes).  Arriving late affects you and other patients whose appointments are after yours.  Also, if you miss three or more appointments without notifying the office, you may be dismissed from the clinic at the provider's discretion.      For prescription refill requests, have your pharmacy contact our office and allow 72 hours for refills to be completed.    Today you received the following chemotherapy and/or immunotherapy agents: Trodelvy.       To help prevent nausea and vomiting after your treatment, we encourage you to take your nausea medication as directed.  BELOW ARE SYMPTOMS THAT SHOULD BE REPORTED IMMEDIATELY: *FEVER GREATER THAN 100.4 F (38 C) OR HIGHER *CHILLS OR SWEATING *NAUSEA AND VOMITING THAT IS NOT CONTROLLED WITH YOUR NAUSEA MEDICATION *UNUSUAL SHORTNESS OF BREATH *UNUSUAL BRUISING OR BLEEDING *URINARY PROBLEMS (pain or burning when urinating, or frequent urination) *BOWEL PROBLEMS (unusual diarrhea, constipation, pain near the anus) TENDERNESS IN MOUTH AND THROAT WITH OR WITHOUT PRESENCE OF ULCERS (sore throat, sores in mouth, or a toothache) UNUSUAL RASH, SWELLING OR PAIN  UNUSUAL VAGINAL DISCHARGE OR ITCHING   Items with * indicate a potential emergency and should be followed up as soon as possible or go to the Emergency Department if any problems should occur.  Please show the CHEMOTHERAPY ALERT CARD or IMMUNOTHERAPY ALERT CARD at check-in to  the Emergency Department and triage nurse.  Should you have questions after your visit or need to cancel or reschedule your appointment, please contact [facility]  Dept: [phone]  and follow the prompts.  Office hours are 8:00 a.m. to 4:30 p.m. Monday - Friday. Please note that voicemails left after 4:00 p.m. may not be returned until the following business day.  We are closed weekends and major holidays. You have access to a nurse at all times for urgent questions. Please call the main number to the clinic Dept: [phone] and follow the prompts.   For any non-urgent questions, you may also contact your provider using MyChart. We now offer e-Visits for anyone 18 and older to request care online for non-urgent symptoms. For details visit [email or web address].   Also download the MyChart app! Go to the app store, search "MyChart", open the app, select , and log in with your MyChart username and password.  Due to Covid, a mask is required upon entering the hospital/clinic. If you do not have a mask, one will be given to you upon arrival. For doctor visits, patients may have 1 support person aged 18 or older with them. For treatment visits, patients cannot have anyone with them due to current Covid guidelines and our immunocompromised population.  

## 2021-12-20 ENCOUNTER — Telehealth: Payer: Self-pay

## 2021-12-20 ENCOUNTER — Inpatient Hospital Stay: Payer: Medicaid Other

## 2021-12-20 ENCOUNTER — Other Ambulatory Visit: Payer: Self-pay | Admitting: Oncology

## 2021-12-20 DIAGNOSIS — Z171 Estrogen receptor negative status [ER-]: Secondary | ICD-10-CM

## 2021-12-20 NOTE — Telephone Encounter (Signed)
This nurse returned call for message received stating that she was not going to be able to make her appointment today for her Udenyca injection and would like to know if she can come tomorrow.  This nurse moved injection appointment to 6/16 at 10:30 am.  Left a message for patient informing her of new appointment date.  Also informed patient that PET scan will be scheduled and if date and time does not work to Electrical engineer to rescheduled. Also advised that information will be sent on My Chart.  No further concerns at this time.

## 2021-12-21 ENCOUNTER — Other Ambulatory Visit: Payer: Self-pay

## 2021-12-21 ENCOUNTER — Inpatient Hospital Stay: Payer: Medicaid Other

## 2021-12-21 DIAGNOSIS — Z5111 Encounter for antineoplastic chemotherapy: Secondary | ICD-10-CM | POA: Diagnosis not present

## 2021-12-21 DIAGNOSIS — Z171 Estrogen receptor negative status [ER-]: Secondary | ICD-10-CM

## 2021-12-21 MED ORDER — PEGFILGRASTIM-CBQV 6 MG/0.6ML ~~LOC~~ SOSY
6.0000 mg | PREFILLED_SYRINGE | Freq: Once | SUBCUTANEOUS | Status: AC
  Administered 2021-12-21: 6 mg via SUBCUTANEOUS
  Filled 2021-12-21: qty 0.6

## 2021-12-24 ENCOUNTER — Other Ambulatory Visit: Payer: Self-pay | Admitting: Hematology

## 2021-12-25 ENCOUNTER — Ambulatory Visit (HOSPITAL_COMMUNITY)
Admission: RE | Admit: 2021-12-25 | Discharge: 2021-12-25 | Disposition: A | Discharge status: Home / Self Care | Payer: Medicaid Other | Source: Ambulatory Visit | Attending: Oncology | Admitting: Oncology

## 2021-12-25 ENCOUNTER — Encounter (HOSPITAL_COMMUNITY): Payer: Self-pay

## 2021-12-25 DIAGNOSIS — C50112 Malignant neoplasm of central portion of left female breast: Secondary | ICD-10-CM | POA: Insufficient documentation

## 2021-12-25 DIAGNOSIS — Z171 Estrogen receptor negative status [ER-]: Secondary | ICD-10-CM | POA: Diagnosis present

## 2021-12-25 MED ORDER — SODIUM CHLORIDE (PF) 0.9 % IJ SOLN
INTRAMUSCULAR | Status: AC
  Filled 2021-12-25: qty 50

## 2021-12-25 MED ORDER — IOHEXOL 300 MG/ML  SOLN
100.0000 mL | Freq: Once | INTRAMUSCULAR | Status: AC | PRN
  Administered 2021-12-25: 100 mL via INTRAVENOUS

## 2021-12-30 ENCOUNTER — Other Ambulatory Visit: Payer: Self-pay | Admitting: Hematology

## 2022-01-01 ENCOUNTER — Other Ambulatory Visit: Payer: Self-pay | Admitting: Hematology

## 2022-01-02 ENCOUNTER — Inpatient Hospital Stay: Payer: Medicaid Other

## 2022-01-02 ENCOUNTER — Other Ambulatory Visit: Payer: Self-pay

## 2022-01-02 ENCOUNTER — Encounter: Payer: Self-pay | Admitting: Hematology

## 2022-01-02 ENCOUNTER — Inpatient Hospital Stay: Payer: Medicaid Other | Admitting: Hematology

## 2022-01-02 VITALS — BP 119/84 | HR 91 | Temp 98.1°F | Resp 18 | Ht 64.0 in | Wt 231.7 lb

## 2022-01-02 DIAGNOSIS — Z5111 Encounter for antineoplastic chemotherapy: Secondary | ICD-10-CM | POA: Diagnosis not present

## 2022-01-02 DIAGNOSIS — C50112 Malignant neoplasm of central portion of left female breast: Secondary | ICD-10-CM

## 2022-01-02 DIAGNOSIS — Z171 Estrogen receptor negative status [ER-]: Secondary | ICD-10-CM | POA: Diagnosis not present

## 2022-01-02 DIAGNOSIS — Z95828 Presence of other vascular implants and grafts: Secondary | ICD-10-CM

## 2022-01-02 LAB — CBC WITH DIFFERENTIAL/PLATELET
Abs Immature Granulocytes: 0.05 10*3/uL (ref 0.00–0.07)
Basophils Absolute: 0.1 10*3/uL (ref 0.0–0.1)
Basophils Relative: 1 %
Eosinophils Absolute: 0.1 10*3/uL (ref 0.0–0.5)
Eosinophils Relative: 1 %
HCT: 33.2 % — ABNORMAL LOW (ref 36.0–46.0)
Hemoglobin: 10.7 g/dL — ABNORMAL LOW (ref 12.0–15.0)
Immature Granulocytes: 1 %
Lymphocytes Relative: 16 %
Lymphs Abs: 1.1 10*3/uL (ref 0.7–4.0)
MCH: 31.8 pg (ref 26.0–34.0)
MCHC: 32.2 g/dL (ref 30.0–36.0)
MCV: 98.8 fL (ref 80.0–100.0)
Monocytes Absolute: 0.5 10*3/uL (ref 0.1–1.0)
Monocytes Relative: 8 %
Neutro Abs: 4.9 10*3/uL (ref 1.7–7.7)
Neutrophils Relative %: 73 %
Platelets: 316 10*3/uL (ref 150–400)
RBC: 3.36 MIL/uL — ABNORMAL LOW (ref 3.87–5.11)
RDW: 17.5 % — ABNORMAL HIGH (ref 11.5–15.5)
WBC: 6.6 10*3/uL (ref 4.0–10.5)
nRBC: 0 % (ref 0.0–0.2)

## 2022-01-02 LAB — COMPREHENSIVE METABOLIC PANEL
ALT: 46 U/L — ABNORMAL HIGH (ref 0–44)
AST: 25 U/L (ref 15–41)
Albumin: 3.9 g/dL (ref 3.5–5.0)
Alkaline Phosphatase: 135 U/L — ABNORMAL HIGH (ref 38–126)
Anion gap: 7 (ref 5–15)
BUN: 18 mg/dL (ref 6–20)
CO2: 26 mmol/L (ref 22–32)
Calcium: 9.3 mg/dL (ref 8.9–10.3)
Chloride: 107 mmol/L (ref 98–111)
Creatinine, Ser: 0.74 mg/dL (ref 0.44–1.00)
GFR, Estimated: >60 mL/min (ref >60)
Glucose, Bld: 107 mg/dL — ABNORMAL HIGH (ref 70–99)
Potassium: 3.8 mmol/L (ref 3.5–5.1)
Sodium: 140 mmol/L (ref 135–145)
Total Bilirubin: 0.2 mg/dL — ABNORMAL LOW (ref 0.3–1.2)
Total Protein: 6.5 g/dL (ref 6.5–8.1)

## 2022-01-02 MED ORDER — SODIUM CHLORIDE 0.9 % IV SOLN
150.0000 mg | Freq: Once | INTRAVENOUS | Status: AC
  Administered 2022-01-02: 150 mg via INTRAVENOUS
  Filled 2022-01-02: qty 150

## 2022-01-02 MED ORDER — SODIUM CHLORIDE 0.9 % IV SOLN: Freq: Once | INTRAVENOUS | Status: AC

## 2022-01-02 MED ORDER — HEPARIN SOD (PORK) LOCK FLUSH 100 UNIT/ML IV SOLN
500.0000 [IU] | Freq: Once | INTRAVENOUS | Status: AC | PRN
  Administered 2022-01-02: 500 [IU]

## 2022-01-02 MED ORDER — PALONOSETRON HCL INJECTION 0.25 MG/5ML
0.2500 mg | Freq: Once | INTRAVENOUS | Status: AC
  Administered 2022-01-02: 0.25 mg via INTRAVENOUS
  Filled 2022-01-02: qty 5

## 2022-01-02 MED ORDER — FAMOTIDINE IN NACL 20-0.9 MG/50ML-% IV SOLN
20.0000 mg | Freq: Once | INTRAVENOUS | Status: AC
  Administered 2022-01-02: 20 mg via INTRAVENOUS
  Filled 2022-01-02: qty 50

## 2022-01-02 MED ORDER — ACETAMINOPHEN 325 MG PO TABS
650.0000 mg | ORAL_TABLET | Freq: Once | ORAL | Status: AC
  Administered 2022-01-02: 650 mg via ORAL
  Filled 2022-01-02: qty 2

## 2022-01-02 MED ORDER — SODIUM CHLORIDE 0.9% FLUSH
10.0000 mL | INTRAVENOUS | Status: DC | PRN
  Administered 2022-01-02: 10 mL

## 2022-01-02 MED ORDER — SODIUM CHLORIDE 0.9 % IV SOLN
10.0000 mg | Freq: Once | INTRAVENOUS | Status: AC
  Administered 2022-01-02: 10 mg via INTRAVENOUS
  Filled 2022-01-02: qty 10

## 2022-01-02 MED ORDER — SODIUM CHLORIDE 0.9 % IV SOLN
8.0000 mg/kg | Freq: Once | INTRAVENOUS | Status: AC
  Administered 2022-01-02: 830 mg via INTRAVENOUS
  Filled 2022-01-02: qty 83

## 2022-01-02 MED ORDER — DIPHENHYDRAMINE HCL 50 MG/ML IJ SOLN
50.0000 mg | Freq: Once | INTRAMUSCULAR | Status: AC
  Administered 2022-01-02: 50 mg via INTRAVENOUS
  Filled 2022-01-02: qty 1

## 2022-01-02 MED ORDER — GABAPENTIN 100 MG PO CAPS: 100.0000 mg | ORAL_CAPSULE | Freq: Two times a day (BID) | ORAL | 0 refills | Status: DC

## 2022-01-02 MED ORDER — SODIUM CHLORIDE 0.9% FLUSH
10.0000 mL | Freq: Once | INTRAVENOUS | Status: AC
  Administered 2022-01-02: 10 mL

## 2022-01-02 NOTE — Patient Instructions (Signed)
[facility]  Discharge Instructions: Thank you for choosing [facility] to provide your oncology and hematology care.   If you have a lab appointment with the [facility], please go directly to the [facility] and check in at the registration area.   Wear comfortable clothing and clothing appropriate for easy access to any Portacath or PICC line.   We strive to give you quality time with your provider. You may need to reschedule your appointment if you arrive late (15 or more minutes).  Arriving late affects you and other patients whose appointments are after yours.  Also, if you miss three or more appointments without notifying the office, you may be dismissed from the clinic at the provider's discretion.      For prescription refill requests, have your pharmacy contact our office and allow 72 hours for refills to be completed.    Today you received the following chemotherapy and/or immunotherapy agents :  [name]       To help prevent nausea and vomiting after your treatment, we encourage you to take your nausea medication as directed.  BELOW ARE SYMPTOMS THAT SHOULD BE REPORTED IMMEDIATELY: *FEVER GREATER THAN 100.4 F (38 C) OR HIGHER *CHILLS OR SWEATING *NAUSEA AND VOMITING THAT IS NOT CONTROLLED WITH YOUR NAUSEA MEDICATION *UNUSUAL SHORTNESS OF BREATH *UNUSUAL BRUISING OR BLEEDING *URINARY PROBLEMS (pain or burning when urinating, or frequent urination) *BOWEL PROBLEMS (unusual diarrhea, constipation, pain near the anus) TENDERNESS IN MOUTH AND THROAT WITH OR WITHOUT PRESENCE OF ULCERS (sore throat, sores in mouth, or a toothache) UNUSUAL RASH, SWELLING OR PAIN  UNUSUAL VAGINAL DISCHARGE OR ITCHING   Items with * indicate a potential emergency and should be followed up as soon as possible or go to the Emergency Department if any problems should occur.  Please show the CHEMOTHERAPY ALERT CARD or IMMUNOTHERAPY ALERT CARD at check-in  to the Emergency Department and triage nurse.  Should you have questions after your visit or need to cancel or reschedule your appointment, please contact [facility]  Dept: [phone]  and follow the prompts.  Office hours are 8:00 a.m. to 4:30 p.m. Monday - Friday. Please note that voicemails left after 4:00 p.m. may not be returned until the following business day.  We are closed weekends and major holidays. You have access to a nurse at all times for urgent questions. Please call the main number to the clinic Dept: [phone] and follow the prompts.   For any non-urgent questions, you may also contact your provider using MyChart. We now offer e-Visits for anyone 84 and older to request care online for non-urgent symptoms. For details visit [email or web address].   Also download the MyChart app! Go to the app store, search "MyChart", open the app, select [facility], and log in with your MyChart username and password.  Masks are optional in the cancer centers. If you would like for your care team to wear a mask while they are taking care of you, please let them know. For doctor visits, patients may have with them one support person who is at least 39 years old. At this time, visitors are not allowed in the infusion area.

## 2022-01-02 NOTE — Progress Notes (Signed)
Caitlyn Hendricks   [phone] [phone]   Clinic Follow up Note   Patient Care Team: [name], MD as PCP - General (Family Medicine) Morrison Old, NP as PCP - Dermatology (Nurse Practitioner) Mauro Kaufmann, RN as Oncology Nurse Navigator Rockwell Germany, RN as Oncology Nurse Navigator [name], MD as Consulting Physician (General Surgery) Truitt Merle, MD as Consulting Physician (Hematology) Gery Pray, MD as Consulting Physician ([place]) Debby Bud, [place] (Physician Assistant)  Date of Service:  01/02/2022  CHIEF COMPLAINT: f/u of left breast cancer  CURRENT THERAPY:  Caitlyn Hendricks, started 09/17/21  ASSESSMENT & PLAN:  Caitlyn Hendricks is a 41 y.o. pre-menopausal female with   1. 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 Biopsy on 03/08/20 revealed invasive ductal carcinoma, triple negative, metastatic to her left axillary LN. CT CAP/Bone scan negative for distant mets.  -She completed neoadjuvant chemo with ddAC q2weeks for 4 cycles 03/22/20-05/03/20. Followed by weekly carbo/taxol for 12 weeks 05/17/20-08/16/20.  -she started [facility] for 1 year treatment on 04/28/20, but stopped in 10/2020 due to skin rash and arthralgia.   -s/p left mastectomy with Dr Donne Hazel on 09/20/20, path showed No residual invasive carcinoma in breast s/p neoadjuvant treatment, but 7/19 positive LN.  -she began concurrent chemoRT with oral Xeloda on 10/30/20 and completed radiation on 12/11/20. She developed a rash from Xeloda, and this was held from 5/16-6/27/22. When this was restarted, she again developed a rash and joint pain. It was switched to 5-FU on 01/18/21 for 4 cycles. During that time, she was diagnosed with Lupus and put on medication. With the  rash under control, she switched back to Xeloda and completed 6 months of treatment in 05/2021.  --she developed skin erythema with nodularity to left chest wall. Punch biopsy of the area on 08/28/21 revealed infiltrating carcinoma, consistent with breast origin. This was found to be Her2-. -PET scan on 09/13/21 showed: hypermetabolic left level IV, left supraclavicular, and right axillary adenopathy. -she started [employer] Caitlyn Hendricks) on 09/17/21. Aside from hospitalization for C.diff on 09/28/21, she has tolerated well overall, and her left chest discomfort and skin lesion have improved. -restaging CT CAP 12/25/21 showed NED. I personally reviewed the images and discussed the results with them today.  I also discussed with her that although she has had complete radiographic response to chemotherapy, she likely still have microscopic disease, her cancer is unlikely acute, I recommend continued current treatment.  She voiced good understanding and agrees with the plan.  She is clinically doing well, skin also on the left chest wall has healed, no palpable adenopathy.  She is tolerating treatment well overall, diarrhea is better managed with dose reduction and Imodium/Lomotil. -labs reviewed, overall stable.  We will proceed to treatment today.   2. Diarrhea, Hot flashes -secondary to Caitlyn Hendricks -developed diarrhea around 09/24/21 after C1, hospitalized 3/24-3/30/23 with C.diff -diarrhea controlled with lomotil -she is again having hot flashes. She previously took gabapentin, so I will call in for her again today. I discussed she can increase the dose as needed and take up to three times a day.  3. Headache/Left-sided head pain -new onset 10/2021, pain that radiates to her eye -h/o migraines with aura, but feels this is different. -brain MRI 12/05/21 was negative -not mentioned today (01/02/22)  4. Cutaneous Lupus -She had several episodes of rash since she started chemo treatment; skin biopsy  showed a drug-related dermatitis. The rash persisted on Keytruda, Xeloda, and 5-FU. -lab work with her dermatologist on 02/27/21 revealed cutaneous Lupus. She is now on medication for this. -since being on the medication for Lupus, her rash has greatly improved.   5. Anxiety, Social support, PTSD -She has a history of anxiety, currently well managed with sertraline and Xanax. -she is seeing therapist Stormy Card and psychiatrist Debby Bud, [place].  -She has very good social support from mother and boyfriend. 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 -she enrolled in [place] 2023   6. Genetic Testing negative for pathogenetic mutations with VUS of POLE gene     PLAN: -lab and scan reviewed, she has had excellent response to chemotherapy  -proceed with C5 Trodelvy today at the same reduced dose and next week -GCSF on day 9  -I refilled gabapentin -lab, flush, and Trodelvy in 3 and 4 weeks -f/u in 3 weeks   No problem-specific Assessment & Plan notes found for this encounter.   SUMMARY OF ONCOLOGIC HISTORY: Oncology History Overview Note  Cancer Staging Cancer of central portion of left female breast [facility]) Staging form: Breast, AJCC 8th Edition - Clinical stage from 03/08/2020: Stage IIIB (cT2, cN1, cM0, G3, ER-, PR-, HER2-) - Signed by Truitt Merle, MD on 03/10/2020 Stage prefix: Initial diagnosis Histologic grading system: 3 grade system - Pathologic stage from 09/20/2020: No Stage Recommended (ypT0, pN2a, cM0, G3, ER-, PR-, HER2-) - Signed by Gardenia Phlegm, NP on 10/04/2020 Stage prefix: Post-therapy Histologic grading system: 3 grade system    Cancer of central portion of left female breast ([place])  02/23/2020 Mammogram   Diagnostic Mammogram 02/23/20  IMPRESSION The 2x1x2.6cm irregular mass in teh left breast at 12:00 posiiton middle depth is highly suspicious of malignancy. An [place] is recommended for further evaluation and  biopsy planning purposes.   02/23/2020 Breast US   [name] 02/23/20  IMPRESSION 2 adjacent spiculated masses in the left brast at 12:00 position 3 cm from the nipple (2.1x0.9x1.1cm and 1.1x1.4x0.5cm) is suggestive of malignancy.   Multiple abnormal left subpectoral and left axillary nodes measuring 3.3x2.1 cm concerning for metastatic adenopathy.    Left breast skin thickening and edema may be secondary congestive edema due to extensive axillary adenopathy.    03/08/2020 Initial Biopsy   Diagnosis 1.Breast, left, needle core biopsy, 12:00 position, 3cmfn -INVASIVE DUCTAL CARCINOMA -SEE COMMENT  2. Lymph node, needle/core biopsy, left axilla -METASTATIC CARCINOMA INVOLVING A LYMPH NODE  -LYMPHOVASCULAR SPACE INVASION PRESENT   Microscopic Comment  1.Based on the biopsy the carcinoma appears Nottingham Grade 3 or 3 and measures 1 cm in the greatest linear extent.    03/08/2020 Receptors her2   ER- Negative 0% PR - Negative 0% HER2 - Negative  KI 67 - 80%    03/08/2020 Cancer Staging   Staging form: Breast, AJCC 8th Edition - Clinical stage from 03/08/2020: Stage IIIB (cT2, cN1, cM0, G3, ER-, PR-, HER2-) - Signed by Truitt Merle, MD on 03/10/2020   03/10/2020 Initial Diagnosis   Cancer of central portion of left female breast ([place])   03/16/2020 Breast MRI   IMPRESSION: 1. 8.1 x 7.8 x 6.6 cm biopsy proven invasive ductal carcinoma in the central right breast, involving 3 quadrants. 2. 3.0 x 1.7 x 1.1 cm satellite mass more inferiorly in lower inner quadrant of  the left breast, compatible with additional malignancy. 3. Metastatic level 1 and level 2 left axillary lymph nodes. 4. No evidence of malignancy on the right.   03/17/2020 Imaging   IMPRESSION: CT CAP w contrast  1. Diffuse skin thickening in the left breast with left axillary and subpectoral lymphadenopathy, as well as a mildly enlarged left supraclavicular lymph node, which likely represents metastatic lymphadenopathy.  No other definite extra nodal metastatic disease noted elsewhere in the chest, abdomen or pelvis. 2. Large mass in the central anatomic pelvis which is of uncertain origin, potentially a large exophytic fibroid or a large solid mass arising from the right ovary. Further evaluation with pelvic ultrasound is strongly recommended.   03/21/2020 Imaging   Bone Scan  IMPRESSION: Apparent arthropathy at L5. No bony metastatic disease is demonstrable on this study. Scattered foci of abnormal uptake in a pelvic mass is of uncertain etiology given absence of calcification in this mass by CT. This mass compresses the urinary bladder. It is possible that some of the increased uptake in this area actually represents physiologic uptake within the bladder.   Kidneys noted in flank positions bilaterally.     03/22/2020 - 08/16/2020 Neo-Adjuvant Chemotherapy   Neoadjuvant Adriamycin and Cytoxan q2weeks for 4 cycles starting 03/22/20-05/03/20 followed by weekly Taxol and Carboplatin for 12 weeks starting 05/17/20-08/16/20   03/24/2020 Imaging   US Pelvis  IMPRESSION: 1. Large pedunculated lesion directly contiguous with the uterine most suggestive of a large subserosal fibroid which measures up to 8.2 cm. 2. Additional 1 cm probable intramural fibroid in the right anterior uterine body. 3. No other acute or worrisome pelvic abnormality.   03/29/2020 Genetic Testing   Negative genetic testing on the common hereditary cancer panel.  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  The Common Hereditary Gene Panel offered by [facility] includes sequencing and/or deletion duplication testing of the following 47 genes: APC, ATM, AXIN2, BARD1, BMPR1A, BRCA1, BRCA2, BRIP1, CDH1, CDK4, CDKN2A (p14ARF), CDKN2A (p16INK4a), CHEK2, CTNNA1, DICER1, EPCAM (Deletion/duplication testing only), GREM1 (promoter region deletion/duplication testing only), KIT, MEN1, MLH1, MSH2, MSH3, MSH6, MUTYH, NBN, NF1, NHTL1, PALB2, PDGFRA, PMS2, POLD1,  POLE, PTEN, RAD50, RAD51C, RAD51D, SDHB, SDHC, SDHD, SMAD4, SMARCA4. STK11, TP53, TSC1, TSC2, and VHL.  The following genes were evaluated for sequence changes only: SDHA and HOXB13 c.251G>A variant only. The report date is 03/29/2020   04/28/2020 - 10/27/2020 Antibody Plan   Added Keytruda q3weeks starting 04/28/20 to complete 1 year of treatment. Held since 10/27/20 due to skin rash and body aches.    08/17/2020 Breast MRI   IMPRESSION: 1. The biopsy proven malignancy in the left breast and the adjacent satellite lesion have resolved in the interval. No abnormalities in these locations today. 2. No MRI evidence of malignancy in the right breast. 3. No adenopathy identified today   09/20/2020 Surgery   LEFT MODIFIED RADICAL MASTECTOMY by Dr Donne Hazel   09/20/2020 Pathology Results   FINAL MICROSCOPIC DIAGNOSIS:   A. BREAST, LEFT, MODIFIED RADICAL MASTECTOMY:  - No residual invasive carcinoma in breast status post neoadjuvant  treatment.  - Metastatic carcinoma in (2) of (14) lymph nodes.  - Biopsy sites (one in breast, one in one of the positive lymph nodes).  - See oncology table.   B. ADDITIONAL AXILLARY CONTENTS, LEFT, DISSECTION:  - Metastatic carcinoma in (5) of (5) lymph nodes.    09/20/2020 Cancer Staging   Staging form: Breast, AJCC 8th Edition - Pathologic stage from 09/20/2020: No Stage Recommended (ypT0, pN2a,  cM0, G3, ER-, PR-, HER2-) - Signed by Gardenia Phlegm, NP on 10/04/2020 Stage prefix: Post-therapy Histologic grading system: 3 grade system   10/30/2020 - 12/11/2020 Radiation Therapy   Adjuvant Radiation with Dr Sondra Come starting 10/30/20   10/30/2020 -  Chemotherapy   Adjuvant Xeloda starting 10/30/20 at 1558m BID M-F while on RT. Held since 11/20/2020 due to recurrent skin rash   01/18/2021 - 03/17/2021 Chemotherapy   Patient is on Treatment Plan : COLORECTAL 5FU / Leucovorin Modified [place] q14d x 12 cycles     03/28/2021 Imaging   CT CAP  IMPRESSION: 1.  Status post left modified radical mastectomy and left axillary lymph node dissection, with no definitive findings to suggest metastatic disease in the chest, abdomen or pelvis on today's examination. Developing postradiation fibrosis in the apex of the left upper lobe is new compared to the prior study, but typical in the setting of prior axillary radiation therapy. 2. There appears to be 2 lesions in the uterus, one of which appears slightly enlarged compared to the prior study, while the largest lesion on the prior examination has partially regressed, as detailed above. 3. Hepatic steatosis.   06/15/2021 Imaging   EXAM: CT CHEST, ABDOMEN, AND PELVIS WITH CONTRAST  IMPRESSION: 1. Stable exam. No evidence for metastatic disease in the chest, abdomen, or pelvis. 2. 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 3. Uterine fibroids.   08/28/2021 Pathology Results   Diagnosis  Skin, left chest wall, punch  - INFILTRATING CARCINOMA, CONSISTENT WITH BREAST ORIGIN Microscopic Description Sections show an infiltrating tumor composed of cords and strands of single cells arranged in a back-to-back pattern with gland-like structures. The individual tumor cells are pleomorphic with enlarged nuclei, small nucleoli and an increased mitotic index. The surrounding stroma is collagenous. No connection is seen between the tumor and the overlying dermis.  NOTE: The tumor cells are strongly positive for Cytokeratin 7 and GATA-3 staining. The combined histologic and immunohistochemical findings, in addition to the clinical history, support a diagnosis of breast carcinoma involving the skin.   09/13/2021 PET scan   IMPRESSION: 1. Hypermetabolic left level IV lower neck adenopathy and left supraclavicular and right axillary adenopathy compatible with active malignancy. 2. New nodularity or nodular airspace opacities in the apical segment left upper lobe, and in subpleural portions of the  left upper lobe and adjacent left lower lobe with low to moderate activity. This could reflect findings related to interval radiation therapy or possibly pneumonia. Malignancy is less likely given the configuration but not entirely excluded, and surveillance is suggested. 3. The soft tissue mass arising from the right uterine fundus has changed its orientation compared to prior exams, and measures 8.1 cm in long axis. This has mildly greater uptake than the myometrium with maximum SUV of 6.0. Prior characterization has suggested that this is likely a fibroid and the lesion does not appear to have substantially enlarged since earliest available comparison of 03/17/2020. Maximum SUV of 6.0 can be seen in uterine fibroids. The lesion is somewhat unusual in its size and appearance for fibroid, and surveillance is probably reasonable. 4. Accentuated activity along the vaginal vestibule/perineum, without visible CT abnormality, probably from slight urinary incontinence of urinary FDG. 5. High activity along the distal Port-A-Cath catheter, with calcifications in the SVC in the vicinity of this catheter raising suspicion for chronic mural thrombus. The very high activity around the catheter likely relates to stasis of injected FDG along fibrin sheath/chronic calcification.   09/17/2021 -  Chemotherapy   Patient is on Treatment Plan : BREAST METASTATIC Sacituzumab govitecan-hziy Caitlyn Hendricks) q21d        INTERVAL HISTORY:  Caitlyn Hendricks is here for a follow up of breast cancer. She was last seen by NP Anderson Malta on 12/13/21. She presents to the clinic accompanied by her dad. She reports she is tolerating treatment well overall with diarrhea and hot flashes.   All other systems were reviewed with the patient and are negative.  MEDICAL HISTORY:  Past Medical History:  Diagnosis Date   Anemia    Anxiety    Arthritis    Cancer ([place])    Left Breast   Depression    GERD (gastroesophageal reflux  disease)    Headache    History of radiation therapy 10/30/20-12/11/20   left chest wall and axilla - Dr. Gery Pray   Panic attack     SURGICAL HISTORY: Past Surgical History:  Procedure Laterality Date   MASTECTOMY MODIFIED RADICAL Left 09/20/2020   Procedure: LEFT MODIFIED RADICAL MASTECTOMY;  Surgeon: [name], MD;  Location: [place];  Service: General;  Laterality: Left;  RNFA; PEC BLOCK;   PORTACATH PLACEMENT Right 03/21/2020   Procedure: INSERTION PORT-A-CATH WITH ULTRASOUND GUIDANCE;  Surgeon: [name], MD;  Location: [place];  Service: General;  Laterality: Right;    I have reviewed the social history and family history with the patient and they are unchanged from previous note.  ALLERGIES:  is allergic to medroxyprogesterone and xeloda [capecitabine].  MEDICATIONS:  Current Outpatient Medications  Medication Sig Dispense Refill   gabapentin (NEURONTIN) 100 MG capsule Take 1 capsule (100 mg total) by mouth 2 (two) times daily. OK to increase dose gradually to 324m twice daily 90 capsule 0   ALPRAZolam (XANAX) 1 MG tablet Take 1 mg by mouth 4 (four) times daily as needed for anxiety.     baclofen (LIORESAL) 10 MG tablet TAKE 1 TABLET BY MOUTH TWICE A DAY AS NEEDED FOR MUSCLE SPASMS 60 tablet 0   dexamethasone (DECADRON) 4 MG tablet Take 2 tablets (8 mg) daily for 3 days after chemotherapy. Take with food. 30 tablet 1   diphenhydrAMINE (BENADRYL) 25 mg capsule Take 25 mg by mouth every 6 (six) hours as needed for allergies.     diphenoxylate-atropine (LOMOTIL) 2.5-0.025 MG tablet Take 1-2 tablets by mouth 4 (four) times daily as needed for diarrhea or loose stools. 45 tablet 1   Emollient (EUCERIN) lotion Apply 1 application. topically as needed for dry skin (itchyness (red cap)).     hydroxychloroquine (PLAQUENIL) 200 MG tablet Take 200 mg by mouth 2 (two) times daily.     ibuprofen (ADVIL) 200 MG tablet Take 800 mg by mouth every 8 (eight)  hours as needed for moderate pain.     Ibuprofen-diphenhydrAMINE Cit (ADVIL PM PO) Take 1 capsule by mouth at bedtime as needed (sleep).     lidocaine-prilocaine (EMLA) cream APPLY TO AFFECTED AREA ONCE AS NEEDED (PORT ACCESS) (Patient taking differently: Apply 1 application. topically as needed (port access).) 30 g 3   lidocaine-prilocaine (EMLA) cream Apply 1 application. topically as needed. 30 g 0   pantoprazole (PROTONIX) 20 MG tablet TAKE 1 TABLET BY MOUTH EVERY DAY 30 tablet 0   prochlorperazine (COMPAZINE) 10 MG tablet Take 1 tablet (10 mg total) by mouth every 6 (six) hours as needed for nausea or vomiting. 30 tablet 1   sertraline (ZOLOFT) 50 MG tablet Take 50 mg by mouth every morning.  No current facility-administered medications for this visit.   Facility-Administered Medications Ordered in Other Visits  Medication Dose Route Frequency Provider Last Rate Last Admin   dexamethasone (DECADRON) 10 mg in sodium chloride 0.9 % 50 mL IVPB  10 mg Intravenous Once Truitt Merle, MD       diphenhydrAMINE (BENADRYL) injection 50 mg  50 mg Intravenous Once Truitt Merle, MD       famotidine (PEPCID) IVPB 20 mg premix  20 mg Intravenous Once Truitt Merle, MD       fosaprepitant (EMEND) 150 mg in sodium chloride 0.9 % 145 mL IVPB  150 mg Intravenous Once Truitt Merle, MD       heparin lock flush 100 unit/mL  500 Units Intracatheter Once PRN Truitt Merle, MD       palonosetron (ALOXI) injection 0.25 mg  0.25 mg Intravenous Once Truitt Merle, MD       sacituzumab govitecan-hziy (TRODELVY) 830 mg in sodium chloride 0.9 % 250 mL (2.4925 mg/mL) chemo infusion  8 mg/kg (Treatment Plan Recorded) Intravenous Once Truitt Merle, MD       sodium chloride flush (NS) 0.9 % injection 10 mL  10 mL Intravenous PRN Alla Feeling, NP   10 mL at 03/28/20 1104   sodium chloride flush (NS) 0.9 % injection 10 mL  10 mL Intracatheter PRN Truitt Merle, MD        PHYSICAL EXAMINATION: ECOG PERFORMANCE STATUS: 1 - Symptomatic but  completely ambulatory  Vitals:   01/02/22 0912  BP: 119/84  Pulse: 91  Resp: 18  Temp: 98.1 F (36.7 C)  SpO2: 100%   Wt Readings from Last 3 Encounters:  01/02/22 231 lb 11.2 oz (105.1 kg)  12/19/21 229 lb 8 oz (104.1 kg)  12/12/21 230 lb 11.2 oz (104.6 kg)     GENERAL:alert, no distress and comfortable SKIN: skin color normal, no rashes or significant lesions. Breast is healing well EYES: normal, Conjunctiva are pink and non-injected, sclera clear  NEURO: alert & oriented x 3 with fluent speech   LABORATORY DATA:  I have reviewed the data as listed    Latest Ref Rng & Units 01/02/2022    8:56 AM 12/19/2021    8:50 AM 12/12/2021    8:09 AM  CBC  WBC 4.0 - 10.5 K/uL 6.6  3.0  5.5   Hemoglobin 12.0 - 15.0 g/dL 10.7  10.4  10.9   Hematocrit 36.0 - 46.0 % 33.2  31.9  33.9   Platelets 150 - 400 K/uL 316  364  337         Latest Ref Rng & Units 01/02/2022    8:56 AM 12/19/2021    8:50 AM 12/12/2021    8:09 AM  CMP  Glucose 70 - 99 mg/dL 107  109  104   BUN 6 - 20 mg/dL _0 Creatinine 0.44 - 1.00 mg/dL 0.74  0.72  0.64   Sodium 135 - 145 mmol/L 140  139  138   Potassium 3.5 - 5.1 mmol/L 3.8  3.7  4.2   Chloride 98 - 111 mmol/L 107  103  105   CO2 22 - 32 mmol/L _1 Calcium 8.9 - 10.3 mg/dL 9.3  9.5  9.4   Total Protein 6.5 - 8.1 g/dL 6.5  6.8  6.8   Total Bilirubin 0.3 - 1.2 mg/dL 0.2  0.6  0.3   Alkaline Phos 38 - 126 U/L 135  125  132   AST 15 - 41 U/L _0 ALT 0 - 44 U/L 46  38  35       RADIOGRAPHIC STUDIES: I have personally reviewed the radiological images as listed and agreed with the findings in the report. No results found.    No orders of the defined types were placed in this encounter.  All questions were answered. The patient knows to call the clinic with any problems, questions or concerns. No barriers to learning was detected. The total time spent in the appointment was 30 minutes.     Truitt Merle, MD 01/02/2022   I,  Wilburn Mylar, am acting as scribe for Truitt Merle, MD.   I have reviewed the above documentation for accuracy and completeness, and I agree with the above.

## 2022-01-03 ENCOUNTER — Other Ambulatory Visit: Payer: Self-pay | Admitting: Hematology

## 2022-01-04 ENCOUNTER — Telehealth: Payer: Self-pay | Admitting: Hematology

## 2022-01-04 NOTE — Telephone Encounter (Signed)
Scheduled follow-up appointments per 6/28 los. Patient is aware. 

## 2022-01-07 ENCOUNTER — Other Ambulatory Visit: Payer: Self-pay | Admitting: Hematology

## 2022-01-07 MED FILL — Dexamethasone Sodium Phosphate Inj 100 MG/10ML: INTRAMUSCULAR | Qty: 1 | Status: AC

## 2022-01-07 MED FILL — Fosaprepitant Dimeglumine For IV Infusion 150 MG (Base Eq): INTRAVENOUS | Qty: 5 | Status: AC

## 2022-01-09 ENCOUNTER — Inpatient Hospital Stay: Payer: Medicaid Other

## 2022-01-09 ENCOUNTER — Other Ambulatory Visit: Payer: Self-pay

## 2022-01-09 ENCOUNTER — Telehealth: Payer: Self-pay | Admitting: Hematology

## 2022-01-09 ENCOUNTER — Inpatient Hospital Stay: Payer: Medicaid Other | Attending: Hematology

## 2022-01-09 VITALS — BP 125/90 | HR 87 | Temp 98.4°F | Resp 16 | Wt 231.5 lb

## 2022-01-09 DIAGNOSIS — Z171 Estrogen receptor negative status [ER-]: Secondary | ICD-10-CM | POA: Diagnosis not present

## 2022-01-09 DIAGNOSIS — Z7952 Long term (current) use of systemic steroids: Secondary | ICD-10-CM | POA: Diagnosis not present

## 2022-01-09 DIAGNOSIS — Z923 Personal history of irradiation: Secondary | ICD-10-CM | POA: Diagnosis not present

## 2022-01-09 DIAGNOSIS — K760 Fatty (change of) liver, not elsewhere classified: Secondary | ICD-10-CM | POA: Insufficient documentation

## 2022-01-09 DIAGNOSIS — Z79899 Other long term (current) drug therapy: Secondary | ICD-10-CM | POA: Diagnosis not present

## 2022-01-09 DIAGNOSIS — Z95828 Presence of other vascular implants and grafts: Secondary | ICD-10-CM

## 2022-01-09 DIAGNOSIS — K219 Gastro-esophageal reflux disease without esophagitis: Secondary | ICD-10-CM | POA: Diagnosis not present

## 2022-01-09 DIAGNOSIS — C50112 Malignant neoplasm of central portion of left female breast: Secondary | ICD-10-CM

## 2022-01-09 DIAGNOSIS — R197 Diarrhea, unspecified: Secondary | ICD-10-CM | POA: Diagnosis not present

## 2022-01-09 DIAGNOSIS — R5383 Other fatigue: Secondary | ICD-10-CM | POA: Diagnosis not present

## 2022-01-09 DIAGNOSIS — Z9012 Acquired absence of left breast and nipple: Secondary | ICD-10-CM | POA: Insufficient documentation

## 2022-01-09 DIAGNOSIS — F4310 Post-traumatic stress disorder, unspecified: Secondary | ICD-10-CM | POA: Diagnosis not present

## 2022-01-09 DIAGNOSIS — L930 Discoid lupus erythematosus: Secondary | ICD-10-CM | POA: Diagnosis not present

## 2022-01-09 LAB — CBC WITH DIFFERENTIAL/PLATELET
Abs Immature Granulocytes: 0.01 10*3/uL (ref 0.00–0.07)
Basophils Absolute: 0 10*3/uL (ref 0.0–0.1)
Basophils Relative: 1 %
Eosinophils Absolute: 0 10*3/uL (ref 0.0–0.5)
Eosinophils Relative: 2 %
HCT: 31.2 % — ABNORMAL LOW (ref 36.0–46.0)
Hemoglobin: 10.3 g/dL — ABNORMAL LOW (ref 12.0–15.0)
Immature Granulocytes: 1 %
Lymphocytes Relative: 24 %
Lymphs Abs: 0.5 10*3/uL — ABNORMAL LOW (ref 0.7–4.0)
MCH: 32.3 pg (ref 26.0–34.0)
MCHC: 33 g/dL (ref 30.0–36.0)
MCV: 97.8 fL (ref 80.0–100.0)
Monocytes Absolute: 0.4 10*3/uL (ref 0.1–1.0)
Monocytes Relative: 18 %
Neutro Abs: 1 10*3/uL — ABNORMAL LOW (ref 1.7–7.7)
Neutrophils Relative %: 54 %
Platelets: 356 10*3/uL (ref 150–400)
RBC: 3.19 MIL/uL — ABNORMAL LOW (ref 3.87–5.11)
RDW: 16 % — ABNORMAL HIGH (ref 11.5–15.5)
Smear Review: NORMAL
WBC: 1.9 10*3/uL — ABNORMAL LOW (ref 4.0–10.5)
nRBC: 0 % (ref 0.0–0.2)

## 2022-01-09 LAB — COMPREHENSIVE METABOLIC PANEL
ALT: 42 U/L (ref 0–44)
AST: 25 U/L (ref 15–41)
Albumin: 3.9 g/dL (ref 3.5–5.0)
Alkaline Phosphatase: 124 U/L (ref 38–126)
Anion gap: 6 (ref 5–15)
BUN: 17 mg/dL (ref 6–20)
CO2: 27 mmol/L (ref 22–32)
Calcium: 9.5 mg/dL (ref 8.9–10.3)
Chloride: 104 mmol/L (ref 98–111)
Creatinine, Ser: 0.62 mg/dL (ref 0.44–1.00)
GFR, Estimated: >60 mL/min (ref >60)
Glucose, Bld: 109 mg/dL — ABNORMAL HIGH (ref 70–99)
Potassium: 4 mmol/L (ref 3.5–5.1)
Sodium: 137 mmol/L (ref 135–145)
Total Bilirubin: 0.2 mg/dL — ABNORMAL LOW (ref 0.3–1.2)
Total Protein: 6.7 g/dL (ref 6.5–8.1)

## 2022-01-09 MED ORDER — SODIUM CHLORIDE 0.9% FLUSH
10.0000 mL | Freq: Once | INTRAVENOUS | Status: AC
  Administered 2022-01-09: 10 mL

## 2022-01-09 MED ORDER — HEPARIN SOD (PORK) LOCK FLUSH 100 UNIT/ML IV SOLN
500.0000 [IU] | Freq: Once | INTRAVENOUS | Status: AC
  Administered 2022-01-09: 500 [IU]

## 2022-01-09 NOTE — Addendum Note (Signed)
Addended by: Tora Kindred on: 01/09/2022 04:52 PM   Modules accepted: Orders

## 2022-01-09 NOTE — Telephone Encounter (Signed)
.  Called patient to schedule appointment per 7/5 inbasket, patient is aware of date and time.   

## 2022-01-09 NOTE — Progress Notes (Signed)
ANC 1.0 today. Patient does not want to proceed with treatment with low ANC due to prior hospitalization for neutropenic fever. Spoke with Cira Rue, NP. Patient is going to defer this treatment and return on Friday 7/7 for repeat CBC. She will follow up with Dr [name] prior to next appointment.

## 2022-01-10 ENCOUNTER — Inpatient Hospital Stay: Payer: Medicaid Other

## 2022-01-11 ENCOUNTER — Inpatient Hospital Stay: Payer: Medicaid Other

## 2022-01-11 ENCOUNTER — Other Ambulatory Visit: Payer: Self-pay

## 2022-01-11 DIAGNOSIS — C50112 Malignant neoplasm of central portion of left female breast: Secondary | ICD-10-CM | POA: Diagnosis not present

## 2022-01-11 LAB — COMPREHENSIVE METABOLIC PANEL
ALT: 48 U/L — ABNORMAL HIGH (ref 0–44)
AST: 37 U/L (ref 15–41)
Albumin: 4.1 g/dL (ref 3.5–5.0)
Alkaline Phosphatase: 131 U/L — ABNORMAL HIGH (ref 38–126)
Anion gap: 7 (ref 5–15)
BUN: 16 mg/dL (ref 6–20)
CO2: 27 mmol/L (ref 22–32)
Calcium: 9.6 mg/dL (ref 8.9–10.3)
Chloride: 103 mmol/L (ref 98–111)
Creatinine, Ser: 0.73 mg/dL (ref 0.44–1.00)
GFR, Estimated: >60 mL/min (ref >60)
Glucose, Bld: 125 mg/dL — ABNORMAL HIGH (ref 70–99)
Potassium: 4.3 mmol/L (ref 3.5–5.1)
Sodium: 137 mmol/L (ref 135–145)
Total Bilirubin: 0.3 mg/dL (ref 0.3–1.2)
Total Protein: 7.1 g/dL (ref 6.5–8.1)

## 2022-01-11 LAB — CBC WITH DIFFERENTIAL/PLATELET
Abs Immature Granulocytes: 0.01 10*3/uL (ref 0.00–0.07)
Basophils Absolute: 0 10*3/uL (ref 0.0–0.1)
Basophils Relative: 2 %
Eosinophils Absolute: 0 10*3/uL (ref 0.0–0.5)
Eosinophils Relative: 1 %
HCT: 32.2 % — ABNORMAL LOW (ref 36.0–46.0)
Hemoglobin: 10.5 g/dL — ABNORMAL LOW (ref 12.0–15.0)
Immature Granulocytes: 0 %
Lymphocytes Relative: 22 %
Lymphs Abs: 0.6 10*3/uL — ABNORMAL LOW (ref 0.7–4.0)
MCH: 31.6 pg (ref 26.0–34.0)
MCHC: 32.6 g/dL (ref 30.0–36.0)
MCV: 97 fL (ref 80.0–100.0)
Monocytes Absolute: 0.6 10*3/uL (ref 0.1–1.0)
Monocytes Relative: 24 %
Neutro Abs: 1.2 10*3/uL — ABNORMAL LOW (ref 1.7–7.7)
Neutrophils Relative %: 51 %
Platelets: 374 10*3/uL (ref 150–400)
RBC: 3.32 MIL/uL — ABNORMAL LOW (ref 3.87–5.11)
RDW: 15.7 % — ABNORMAL HIGH (ref 11.5–15.5)
Smear Review: NORMAL
WBC: 2.5 10*3/uL — ABNORMAL LOW (ref 4.0–10.5)
nRBC: 0 % (ref 0.0–0.2)

## 2022-01-11 MED ORDER — SODIUM CHLORIDE 0.9% FLUSH
10.0000 mL | Freq: Once | INTRAVENOUS | Status: AC
  Administered 2022-01-11: 10 mL

## 2022-01-11 MED ORDER — HEPARIN SOD (PORK) LOCK FLUSH 100 UNIT/ML IV SOLN
500.0000 [IU] | Freq: Once | INTRAVENOUS | Status: AC
  Administered 2022-01-11: 500 [IU]

## 2022-01-11 NOTE — Progress Notes (Unsigned)
Patient tn not receive injection per Dr [name] to recover naturally.

## 2022-01-22 MED FILL — Fosaprepitant Dimeglumine For IV Infusion 150 MG (Base Eq): INTRAVENOUS | Qty: 5 | Status: AC

## 2022-01-22 MED FILL — Dexamethasone Sodium Phosphate Inj 100 MG/10ML: INTRAMUSCULAR | Qty: 1 | Status: AC

## 2022-01-23 ENCOUNTER — Inpatient Hospital Stay: Payer: Medicaid Other

## 2022-01-23 ENCOUNTER — Inpatient Hospital Stay: Payer: Medicaid Other | Admitting: Hematology

## 2022-01-23 ENCOUNTER — Other Ambulatory Visit: Payer: Self-pay | Admitting: Hematology

## 2022-01-23 ENCOUNTER — Telehealth: Payer: Self-pay

## 2022-01-23 ENCOUNTER — Telehealth: Payer: Self-pay | Admitting: Hematology

## 2022-01-23 NOTE — Telephone Encounter (Signed)
Pt called stating she overslept this morning and needed to reschedule so we rescheduled pt to tomorrow.  Pt called back stating that she can only come in to do the lab tomorrow because she's going out of town for the remainder of the week and wants to postpone her treatment until next week.  Notified Dr. [name].

## 2022-01-23 NOTE — Telephone Encounter (Signed)
.  Called patient to schedule appointment per 7/18inbasket, patient is aware of date and time.   

## 2022-01-24 ENCOUNTER — Inpatient Hospital Stay: Payer: Medicaid Other | Admitting: Hematology

## 2022-01-24 ENCOUNTER — Inpatient Hospital Stay: Payer: Medicaid Other

## 2022-01-28 ENCOUNTER — Other Ambulatory Visit: Payer: Self-pay

## 2022-01-29 ENCOUNTER — Other Ambulatory Visit: Payer: Self-pay | Admitting: Hematology

## 2022-01-29 MED FILL — Dexamethasone Sodium Phosphate Inj 100 MG/10ML: INTRAMUSCULAR | Qty: 1 | Status: AC

## 2022-01-29 MED FILL — Fosaprepitant Dimeglumine For IV Infusion 150 MG (Base Eq): INTRAVENOUS | Qty: 5 | Status: AC

## 2022-01-30 ENCOUNTER — Inpatient Hospital Stay: Payer: Medicaid Other | Admitting: Hematology

## 2022-01-30 ENCOUNTER — Inpatient Hospital Stay: Payer: Medicaid Other

## 2022-01-30 ENCOUNTER — Other Ambulatory Visit: Payer: Self-pay

## 2022-01-30 ENCOUNTER — Encounter: Payer: Self-pay | Admitting: Hematology

## 2022-01-30 VITALS — BP 136/96 | HR 69 | Temp 98.2°F | Resp 16 | Wt 227.0 lb

## 2022-01-30 DIAGNOSIS — Z171 Estrogen receptor negative status [ER-]: Secondary | ICD-10-CM

## 2022-01-30 DIAGNOSIS — C50112 Malignant neoplasm of central portion of left female breast: Secondary | ICD-10-CM | POA: Diagnosis not present

## 2022-01-30 DIAGNOSIS — Z95828 Presence of other vascular implants and grafts: Secondary | ICD-10-CM

## 2022-01-30 LAB — COMPREHENSIVE METABOLIC PANEL
ALT: 51 U/L — ABNORMAL HIGH (ref 0–44)
AST: 44 U/L — ABNORMAL HIGH (ref 15–41)
Albumin: 4 g/dL (ref 3.5–5.0)
Alkaline Phosphatase: 163 U/L — ABNORMAL HIGH (ref 38–126)
Anion gap: 8 (ref 5–15)
BUN: 18 mg/dL (ref 6–20)
CO2: 27 mmol/L (ref 22–32)
Calcium: 9.1 mg/dL (ref 8.9–10.3)
Chloride: 104 mmol/L (ref 98–111)
Creatinine, Ser: 0.72 mg/dL (ref 0.44–1.00)
GFR, Estimated: >60 mL/min (ref >60)
Glucose, Bld: 111 mg/dL — ABNORMAL HIGH (ref 70–99)
Potassium: 3.8 mmol/L (ref 3.5–5.1)
Sodium: 139 mmol/L (ref 135–145)
Total Bilirubin: 0.3 mg/dL (ref 0.3–1.2)
Total Protein: 7 g/dL (ref 6.5–8.1)

## 2022-01-30 LAB — CBC WITH DIFFERENTIAL/PLATELET
Abs Immature Granulocytes: 0.02 10*3/uL (ref 0.00–0.07)
Basophils Absolute: 0 10*3/uL (ref 0.0–0.1)
Basophils Relative: 1 %
Eosinophils Absolute: 0.1 10*3/uL (ref 0.0–0.5)
Eosinophils Relative: 3 %
HCT: 35.7 % — ABNORMAL LOW (ref 36.0–46.0)
Hemoglobin: 11.6 g/dL — ABNORMAL LOW (ref 12.0–15.0)
Immature Granulocytes: 0 %
Lymphocytes Relative: 21 %
Lymphs Abs: 1 10*3/uL (ref 0.7–4.0)
MCH: 30.1 pg (ref 26.0–34.0)
MCHC: 32.5 g/dL (ref 30.0–36.0)
MCV: 92.7 fL (ref 80.0–100.0)
Monocytes Absolute: 0.5 10*3/uL (ref 0.1–1.0)
Monocytes Relative: 10 %
Neutro Abs: 3.1 10*3/uL (ref 1.7–7.7)
Neutrophils Relative %: 65 %
Platelets: 324 10*3/uL (ref 150–400)
RBC: 3.85 MIL/uL — ABNORMAL LOW (ref 3.87–5.11)
RDW: 15.3 % (ref 11.5–15.5)
WBC: 4.8 10*3/uL (ref 4.0–10.5)
nRBC: 0 % (ref 0.0–0.2)

## 2022-01-30 MED ORDER — SODIUM CHLORIDE 0.9 % IV SOLN
10.0000 mg | Freq: Once | INTRAVENOUS | Status: AC
  Administered 2022-01-30: 10 mg via INTRAVENOUS
  Filled 2022-01-30: qty 10
  Filled 2022-01-30: qty 1

## 2022-01-30 MED ORDER — ATROPINE SULFATE 1 MG/ML IV SOLN
0.5000 mg | Freq: Once | INTRAVENOUS | Status: AC | PRN
  Administered 2022-01-30: 0.5 mg via INTRAVENOUS
  Filled 2022-01-30: qty 1

## 2022-01-30 MED ORDER — FAMOTIDINE IN NACL 20-0.9 MG/50ML-% IV SOLN
20.0000 mg | Freq: Once | INTRAVENOUS | Status: AC
  Administered 2022-01-30: 20 mg via INTRAVENOUS
  Filled 2022-01-30: qty 50

## 2022-01-30 MED ORDER — SODIUM CHLORIDE 0.9 % IV SOLN
150.0000 mg | Freq: Once | INTRAVENOUS | Status: AC
  Administered 2022-01-30: 150 mg via INTRAVENOUS
  Filled 2022-01-30: qty 5
  Filled 2022-01-30: qty 150

## 2022-01-30 MED ORDER — ACETAMINOPHEN 325 MG PO TABS
650.0000 mg | ORAL_TABLET | Freq: Once | ORAL | Status: AC
  Administered 2022-01-30: 650 mg via ORAL
  Filled 2022-01-30: qty 2

## 2022-01-30 MED ORDER — SODIUM CHLORIDE 0.9 % IV SOLN
8.0000 mg/kg | Freq: Once | INTRAVENOUS | Status: AC
  Administered 2022-01-30: 830 mg via INTRAVENOUS
  Filled 2022-01-30: qty 83

## 2022-01-30 MED ORDER — DIPHENHYDRAMINE HCL 50 MG/ML IJ SOLN
50.0000 mg | Freq: Once | INTRAMUSCULAR | Status: AC
  Administered 2022-01-30: 50 mg via INTRAVENOUS
  Filled 2022-01-30: qty 1

## 2022-01-30 MED ORDER — PALONOSETRON HCL INJECTION 0.25 MG/5ML
0.2500 mg | Freq: Once | INTRAVENOUS | Status: AC
  Administered 2022-01-30: 0.25 mg via INTRAVENOUS
  Filled 2022-01-30: qty 5

## 2022-01-30 MED ORDER — SODIUM CHLORIDE 0.9 % IV SOLN: Freq: Once | INTRAVENOUS | Status: AC

## 2022-01-30 MED ORDER — SODIUM CHLORIDE 0.9% FLUSH
10.0000 mL | Freq: Once | INTRAVENOUS | Status: AC
  Administered 2022-01-30: 10 mL

## 2022-01-30 NOTE — Progress Notes (Addendum)
[facility]   [phone] [phone]   Clinic Follow up Note   Patient Care Team: Slatosky, John J., MD as PCP - General (Family Medicine) Markham, Deanna G, NP as PCP - Dermatology (Nurse Practitioner) Stuart, Dawn C, RN as Oncology Nurse Navigator Martini, Keisha N, RN as Oncology Nurse Navigator Wakefield, Matthew, MD as Consulting Physician (General Surgery) , , MD as Consulting Physician (Hematology) Kinard, James, MD as Consulting Physician (Radiation Oncology) Hendricks, Kara, PA (Physician Assistant)  Date of Service:  01/30/2022  CHIEF COMPLAINT: f/u of recurrent left breast cancer  CURRENT THERAPY:  Trodelvy, started 09/17/21  ASSESSMENT & PLAN:  Caitlyn Hendricks is a 41 y.o. pre-menopausal female with   1. 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 Biopsy on 03/08/20 revealed invasive ductal carcinoma, triple negative, metastatic to her left axillary LN. CT CAP/Bone scan negative for distant mets.  -She completed neoadjuvant chemo with ddAC q2weeks for 4 cycles 03/22/20-05/03/20. Followed by weekly carbo/taxol for 12 weeks 05/17/20-08/16/20.  -she started Keytruda q3weeks for 1 year treatment on 04/28/20, but stopped in 10/2020 due to skin rash and arthralgia.   -s/p left mastectomy with Dr Wakefield on 09/20/20, path showed No residual invasive carcinoma in breast s/p neoadjuvant treatment, but 7/19 positive LN.  -she began concurrent chemoRT with oral Xeloda on 10/30/20 and completed radiation on 12/11/20. She developed a rash from Xeloda, and this was held from 5/16-6/27/22. When this was restarted, she again developed a rash and joint pain. It was switched to 5-FU on 01/18/21 for 4 cycles. During that time, she was diagnosed with Lupus and put on medication.  With the rash under control, she switched back to Xeloda and completed 6 months of treatment in 05/2021.  --she developed skin erythema with nodularity to left chest wall. Punch biopsy of the area on 08/28/21 revealed infiltrating carcinoma, consistent with breast origin. This was found to be Her2-. -PET scan on 09/13/21 showed: hypermetabolic left level IV, left supraclavicular, and right axillary adenopathy. -she started Sacituzumab govitecan (Trodelvy) on 09/17/21. Aside from hospitalization for C.diff on 09/28/21, she has tolerated well overall, and her left chest discomfort and skin lesion have improved. -restaging CT CAP 12/25/21 showed NED.  -she continues to experience bothersome diarrhea and fatigue. Last chemo was cancelled due to neutropenia and her travel. Per her request, we will change her treatment every other week. -labs reviewed, overall stable to improved with extra week off treatment. We will proceed to treatment today.   2. Diarrhea, Hot flashes -secondary to Trodelvy -developed diarrhea around 09/24/21 after C1, hospitalized 3/24-3/30/23 with C.diff -diarrhea controlled with lomotil -gabapentin restarted 01/02/22   3. Cutaneous Lupus -She had several episodes of rash since she started chemo treatment; skin biopsy showed a drug-related dermatitis. The rash persisted on Keytruda, Xeloda, and 5-FU. -lab work with her dermatologist on 02/27/21 revealed cutaneous Lupus. She is now on medication for this. -since being on the medication for Lupus, her rash has greatly improved.   4. Anxiety, Social support, PTSD -She has a history of anxiety, currently well managed with sertraline and Xanax. -she is seeing therapist Jenny Gadd and psychiatrist Caitlyn Thornton, PA.  -She has very good social support from mother and boyfriend. 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 -she   enrolled in Medicaid 2023   5. Genetic Testing negative for  pathogenetic mutations with VUS of POLE gene     PLAN: -proceed with C6 Trodelvy today at the same reduced dose, will change her treatment to every 2 weeks from now on, no GCSF -lab, flush, and Trodelvy in 2 and 4 weeks -f/u in 2 weeks   No problem-specific Assessment & Plan notes found for this encounter.   SUMMARY OF ONCOLOGIC HISTORY: Oncology History Overview Note  Cancer Staging Cancer of central portion of left female breast (HCC) Staging form: Breast, AJCC 8th Edition - Clinical stage from 03/08/2020: Stage IIIB (cT2, cN1, cM0, G3, ER-, PR-, HER2-) - Signed by , , MD on 03/10/2020 Stage prefix: Initial diagnosis Histologic grading system: 3 grade system - Pathologic stage from 09/20/2020: No Stage Recommended (ypT0, pN2a, cM0, G3, ER-, PR-, HER2-) - Signed by Causey, Lindsey Cornetto, NP on 10/04/2020 Stage prefix: Post-therapy Histologic grading system: 3 grade system    Cancer of central portion of left female breast (HCC)  02/23/2020 Mammogram   Diagnostic Mammogram 02/23/20  IMPRESSION The 2x1x2.6cm irregular mass in teh left breast at 12:00 posiiton middle depth is highly suspicious of malignancy. An US is recommended for further evaluation and biopsy planning purposes.   02/23/2020 Breast US   US Left breast 02/23/20  IMPRESSION 2 adjacent spiculated masses in the left brast at 12:00 position 3 cm from the nipple (2.1x0.9x1.1cm and 1.1x1.4x0.5cm) is suggestive of malignancy.   Multiple abnormal left subpectoral and left axillary nodes measuring 3.3x2.1 cm concerning for metastatic adenopathy.    Left breast skin thickening and edema may be secondary congestive edema due to extensive axillary adenopathy.    03/08/2020 Initial Biopsy   Diagnosis 1.Breast, left, needle core biopsy, 12:00 position, 3cmfn -INVASIVE DUCTAL CARCINOMA -SEE COMMENT  2. Lymph node, needle/core biopsy, left axilla -METASTATIC CARCINOMA INVOLVING A LYMPH NODE  -LYMPHOVASCULAR SPACE  INVASION PRESENT   Microscopic Comment  1.Based on the biopsy the carcinoma appears Nottingham Grade 3 or 3 and measures 1 cm in the greatest linear extent.    03/08/2020 Receptors her2   ER- Negative 0% PR - Negative 0% HER2 - Negative  KI 67 - 80%    03/08/2020 Cancer Staging   Staging form: Breast, AJCC 8th Edition - Clinical stage from 03/08/2020: Stage IIIB (cT2, cN1, cM0, G3, ER-, PR-, HER2-) - Signed by , , MD on 03/10/2020   03/10/2020 Initial Diagnosis   Cancer of central portion of left female breast (HCC)   03/16/2020 Breast MRI   IMPRESSION: 1. 8.1 x 7.8 x 6.6 cm biopsy proven invasive ductal carcinoma in the central right breast, involving 3 quadrants. 2. 3.0 x 1.7 x 1.1 cm satellite mass more inferiorly in lower inner quadrant of the left breast, compatible with additional malignancy. 3. Metastatic level 1 and level 2 left axillary lymph nodes. 4. No evidence of malignancy on the right.   03/17/2020 Imaging   IMPRESSION: CT CAP w contrast  1. Diffuse skin thickening in the left breast with left axillary and subpectoral lymphadenopathy, as well as a mildly enlarged left supraclavicular lymph node, which likely represents metastatic lymphadenopathy. No other definite extra nodal metastatic disease noted elsewhere in the chest, abdomen or pelvis. 2. Large mass in the central anatomic pelvis which is of uncertain origin, potentially a large exophytic fibroid or a large solid mass arising from the right ovary. Further evaluation with pelvic ultrasound is strongly recommended.   03/21/2020 Imaging   Bone   Scan  IMPRESSION: Apparent arthropathy at L5. No bony metastatic disease is demonstrable on this study. Scattered foci of abnormal uptake in a pelvic mass is of uncertain etiology given absence of calcification in this mass by CT. This mass compresses the urinary bladder. It is possible that some of the increased uptake in this area actually represents physiologic  uptake within the bladder.   Kidneys noted in flank positions bilaterally.     03/22/2020 - 08/16/2020 Neo-Adjuvant Chemotherapy   Neoadjuvant Adriamycin and Cytoxan q2weeks for 4 cycles starting 03/22/20-05/03/20 followed by weekly Taxol and Carboplatin for 12 weeks starting 05/17/20-08/16/20   03/24/2020 Imaging   US Pelvis  IMPRESSION: 1. Large pedunculated lesion directly contiguous with the uterine most suggestive of a large subserosal fibroid which measures up to 8.2 cm. 2. Additional 1 cm probable intramural fibroid in the right anterior uterine body. 3. No other acute or worrisome pelvic abnormality.   03/29/2020 Genetic Testing   Negative genetic testing on the common hereditary cancer panel.  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  The Common Hereditary Gene Panel offered by [facility] includes sequencing and/or deletion duplication testing of the following 47 genes: APC, ATM, AXIN2, BARD1, BMPR1A, BRCA1, BRCA2, BRIP1, CDH1, CDK4, CDKN2A (p14ARF), CDKN2A (p16INK4a), CHEK2, CTNNA1, DICER1, EPCAM (Deletion/duplication testing only), GREM1 (promoter region deletion/duplication testing only), KIT, MEN1, MLH1, MSH2, MSH3, MSH6, MUTYH, NBN, NF1, NHTL1, PALB2, PDGFRA, PMS2, POLD1, POLE, PTEN, RAD50, RAD51C, RAD51D, SDHB, SDHC, SDHD, SMAD4, SMARCA4. STK11, TP53, TSC1, TSC2, and VHL.  The following genes were evaluated for sequence changes only: SDHA and HOXB13 c.251G>A variant only. The report date is 03/29/2020   04/28/2020 - 10/27/2020 Antibody Plan   Added Keytruda q3weeks starting 04/28/20 to complete 1 year of treatment. Held since 10/27/20 due to skin rash and body aches.    08/17/2020 Breast MRI   IMPRESSION: 1. The biopsy proven malignancy in the left breast and the adjacent satellite lesion have resolved in the interval. No abnormalities in these locations today. 2. No MRI evidence of malignancy in the right breast. 3. No adenopathy identified today   09/20/2020 Surgery   LEFT MODIFIED  RADICAL MASTECTOMY by Dr Donne Hazel   09/20/2020 Pathology Results   FINAL MICROSCOPIC DIAGNOSIS:   A. BREAST, LEFT, MODIFIED RADICAL MASTECTOMY:  - No residual invasive carcinoma in breast status post neoadjuvant  treatment.  - Metastatic carcinoma in (2) of (14) lymph nodes.  - Biopsy sites (one in breast, one in one of the positive lymph nodes).  - See oncology table.   B. ADDITIONAL AXILLARY CONTENTS, LEFT, DISSECTION:  - Metastatic carcinoma in (5) of (5) lymph nodes.    09/20/2020 Cancer Staging   Staging form: Breast, AJCC 8th Edition - Pathologic stage from 09/20/2020: No Stage Recommended (ypT0, pN2a, cM0, G3, ER-, PR-, HER2-) - Signed by Gardenia Phlegm, NP on 10/04/2020 Stage prefix: Post-therapy Histologic grading system: 3 grade system   10/30/2020 - 12/11/2020 Radiation Therapy   Adjuvant Radiation with Dr Sondra Come starting 10/30/20   10/30/2020 -  Chemotherapy   Adjuvant Xeloda starting 10/30/20 at 1540m BID M-F while on RT. Held since 11/20/2020 due to recurrent skin rash   01/18/2021 - 03/17/2021 Chemotherapy   Patient is on Treatment Plan : COLORECTAL 5FU / Leucovorin Modified DeGramont q14d x 12 cycles     03/28/2021 Imaging   CT CAP  IMPRESSION: 1. Status post left modified radical mastectomy and left axillary lymph node dissection, with no definitive findings to suggest metastatic disease in the  chest, abdomen or pelvis on today's examination. Developing postradiation fibrosis in the apex of the left upper lobe is new compared to the prior study, but typical in the setting of prior axillary radiation therapy. 2. There appears to be 2 lesions in the uterus, one of which appears slightly enlarged compared to the prior study, while the largest lesion on the prior examination has partially regressed, as detailed above. 3. Hepatic steatosis.   06/15/2021 Imaging   EXAM: CT CHEST, ABDOMEN, AND PELVIS WITH CONTRAST  IMPRESSION: 1. Stable exam. No evidence for  metastatic disease in the chest, abdomen, or pelvis. 2. 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 3. Uterine fibroids.   08/28/2021 Pathology Results   Diagnosis  Skin, left chest wall, punch  - INFILTRATING CARCINOMA, CONSISTENT WITH BREAST ORIGIN Microscopic Description Sections show an infiltrating tumor composed of cords and strands of single cells arranged in a back-to-back pattern with gland-like structures. The individual tumor cells are pleomorphic with enlarged nuclei, small nucleoli and an increased mitotic index. The surrounding stroma is collagenous. No connection is seen between the tumor and the overlying dermis.  NOTE: The tumor cells are strongly positive for Cytokeratin 7 and GATA-3 staining. The combined histologic and immunohistochemical findings, in addition to the clinical history, support a diagnosis of breast carcinoma involving the skin.   09/13/2021 PET scan   IMPRESSION: 1. Hypermetabolic left level IV lower neck adenopathy and left supraclavicular and right axillary adenopathy compatible with active malignancy. 2. New nodularity or nodular airspace opacities in the apical segment left upper lobe, and in subpleural portions of the left upper lobe and adjacent left lower lobe with low to moderate activity. This could reflect findings related to interval radiation therapy or possibly pneumonia. Malignancy is less likely given the configuration but not entirely excluded, and surveillance is suggested. 3. The soft tissue mass arising from the right uterine fundus has changed its orientation compared to prior exams, and measures 8.1 cm in long axis. This has mildly greater uptake than the myometrium with maximum SUV of 6.0. Prior characterization has suggested that this is likely a fibroid and the lesion does not appear to have substantially enlarged since earliest available comparison of 03/17/2020. Maximum SUV of 6.0 can be seen in uterine  fibroids. The lesion is somewhat unusual in its size and appearance for fibroid, and surveillance is probably reasonable. 4. Accentuated activity along the vaginal vestibule/perineum, without visible CT abnormality, probably from slight urinary incontinence of urinary FDG. 5. High activity along the distal Port-A-Cath catheter, with calcifications in the SVC in the vicinity of this catheter raising suspicion for chronic mural thrombus. The very high activity around the catheter likely relates to stasis of injected FDG along fibrin sheath/chronic calcification.   09/17/2021 -  Chemotherapy   Patient is on Treatment Plan : BREAST METASTATIC Sacituzumab govitecan-hziy (Trodelvy) q21d        INTERVAL HISTORY:  Caitlyn Hendricks is here for a follow up of breast cancer. She was last seen by me on 01/02/22. She presents to the clinic accompanied by her mother. She reports continued diarrhea, 3-4 times a day.   All other systems were reviewed with the patient and are negative.  MEDICAL HISTORY:  Past Medical History:  Diagnosis Date   Anemia    Anxiety    Arthritis    Cancer (HCC)    Left Breast   Depression    GERD (gastroesophageal reflux disease)    Headache      History of radiation therapy 10/30/20-12/11/20   left chest wall and axilla - Dr. James Kinard   Panic attack     SURGICAL HISTORY: Past Surgical History:  Procedure Laterality Date   MASTECTOMY MODIFIED RADICAL Left 09/20/2020   Procedure: LEFT MODIFIED RADICAL MASTECTOMY;  Surgeon: Wakefield, Matthew, MD;  Location: [facility] OR;  Service: General;  Laterality: Left;  RNFA; PEC BLOCK;   PORTACATH PLACEMENT Right 03/21/2020   Procedure: INSERTION PORT-A-CATH WITH ULTRASOUND GUIDANCE;  Surgeon: Wakefield, Matthew, MD;  Location: [facility];  Service: General;  Laterality: Right;    I have reviewed the social history and family history with the patient and they are unchanged from previous note.  ALLERGIES:  is  allergic to medroxyprogesterone and xeloda [capecitabine].  MEDICATIONS:  Current Outpatient Medications  Medication Sig Dispense Refill   ALPRAZolam (XANAX) 1 MG tablet Take 1 mg by mouth 4 (four) times daily as needed for anxiety.     baclofen (LIORESAL) 10 MG tablet TAKE 1 TABLET BY MOUTH TWICE A DAY AS NEEDED FOR MUSCLE SPASMS 60 tablet 0   dexamethasone (DECADRON) 4 MG tablet Take 2 tablets (8 mg) daily for 3 days after chemotherapy. Take with food. 30 tablet 1   diphenhydrAMINE (BENADRYL) 25 mg capsule Take 25 mg by mouth every 6 (six) hours as needed for allergies.     diphenoxylate-atropine (LOMOTIL) 2.5-0.025 MG tablet TAKE 1-2 TABLETS BY MOUTH 4 (FOUR) TIMES DAILY AS NEEDED FOR DIARRHEA OR LOOSE STOOLS. 45 tablet 1   Emollient (EUCERIN) lotion Apply 1 application. topically as needed for dry skin (itchyness (red cap)).     gabapentin (NEURONTIN) 100 MG capsule Take 1 capsule (100 mg total) by mouth 2 (two) times daily. OK to increase dose gradually to 300mg twice daily 90 capsule 0   hydroxychloroquine (PLAQUENIL) 200 MG tablet Take 200 mg by mouth 2 (two) times daily.     ibuprofen (ADVIL) 200 MG tablet Take 800 mg by mouth every 8 (eight) hours as needed for moderate pain.     Ibuprofen-diphenhydrAMINE Cit (ADVIL PM PO) Take 1 capsule by mouth at bedtime as needed (sleep).     lidocaine-prilocaine (EMLA) cream APPLY TO AFFECTED AREA ONCE AS NEEDED (PORT ACCESS) (Patient taking differently: Apply 1 application. topically as needed (port access).) 30 g 3   lidocaine-prilocaine (EMLA) cream Apply 1 application. topically as needed. 30 g 0   pantoprazole (PROTONIX) 20 MG tablet TAKE 1 TABLET BY MOUTH EVERY DAY 90 tablet 1   prochlorperazine (COMPAZINE) 10 MG tablet Take 1 tablet (10 mg total) by mouth every 6 (six) hours as needed for nausea or vomiting. 30 tablet 1   sertraline (ZOLOFT) 50 MG tablet Take 50 mg by mouth every morning.     No current facility-administered medications for  this visit.   Facility-Administered Medications Ordered in Other Visits  Medication Dose Route Frequency Provider Last Rate Last Admin   sodium chloride flush (NS) 0.9 % injection 10 mL  10 mL Intravenous PRN Burton, Lacie K, NP   10 mL at 03/28/20 1104    PHYSICAL EXAMINATION: ECOG PERFORMANCE STATUS: 1 - Symptomatic but completely ambulatory  There were no vitals filed for this visit. Wt Readings from Last 3 Encounters:  01/30/22 227 lb (103 kg)  01/09/22 231 lb 8 oz (105 kg)  01/02/22 231 lb 11.2 oz (105.1 kg)     GENERAL:alert, no distress and comfortable SKIN: skin color normal, no rashes or significant lesions EYES: normal, Conjunctiva are pink   and non-injected, sclera clear  NEURO: alert & oriented x 3 with fluent speech   LABORATORY DATA:  I have reviewed the data as listed    Latest Ref Rng & Units 01/30/2022    9:03 AM 01/11/2022    2:49 PM 01/09/2022    9:05 AM  CBC  WBC 4.0 - 10.5 K/uL 4.8  2.5  1.9   Hemoglobin 12.0 - 15.0 g/dL 11.6  10.5  10.3   Hematocrit 36.0 - 46.0 % 35.7  32.2  31.2   Platelets 150 - 400 K/uL 324  374  356         Latest Ref Rng & Units 01/30/2022    9:03 AM 01/11/2022    2:49 PM 01/09/2022    9:05 AM  CMP  Glucose 70 - 99 mg/dL 111  125  109   BUN 6 - 20 mg/dL 18  16  17   Creatinine 0.44 - 1.00 mg/dL 0.72  0.73  0.62   Sodium 135 - 145 mmol/L 139  137  137   Potassium 3.5 - 5.1 mmol/L 3.8  4.3  4.0   Chloride 98 - 111 mmol/L 104  103  104   CO2 22 - 32 mmol/L 27  27  27   Calcium 8.9 - 10.3 mg/dL 9.1  9.6  9.5   Total Protein 6.5 - 8.1 g/dL 7.0  7.1  6.7   Total Bilirubin 0.3 - 1.2 mg/dL 0.3  0.3  0.2   Alkaline Phos 38 - 126 U/L 163  131  124   AST 15 - 41 U/L 44  37  25   ALT 0 - 44 U/L 51  48  42       RADIOGRAPHIC STUDIES: I have personally reviewed the radiological images as listed and agreed with the findings in the report. No results found.    No orders of the defined types were placed in this encounter.  All  questions were answered. The patient knows to call the clinic with any problems, questions or concerns. No barriers to learning was detected. The total time spent in the appointment was 30 minutes       , MD 01/30/2022   I, Katie Daubenspeck, am acting as scribe for  , MD.   I have reviewed the above documentation for accuracy and completeness, and I agree with the above.     

## 2022-01-30 NOTE — Progress Notes (Signed)
Ok to start premeds and treatment before patient sees doctor per Dr. [name].

## 2022-01-31 ENCOUNTER — Telehealth: Payer: Self-pay | Admitting: Hematology

## 2022-01-31 ENCOUNTER — Inpatient Hospital Stay: Payer: Medicaid Other

## 2022-01-31 NOTE — Telephone Encounter (Signed)
Scheduled follow-up appointments per 7/26 los. Patient is aware. 

## 2022-02-01 ENCOUNTER — Other Ambulatory Visit: Payer: Self-pay

## 2022-02-06 ENCOUNTER — Inpatient Hospital Stay: Payer: Medicaid Other | Admitting: Hematology

## 2022-02-06 ENCOUNTER — Inpatient Hospital Stay: Payer: Medicaid Other

## 2022-02-08 ENCOUNTER — Inpatient Hospital Stay: Payer: Medicaid Other | Attending: Hematology

## 2022-02-08 DIAGNOSIS — Z171 Estrogen receptor negative status [ER-]: Secondary | ICD-10-CM | POA: Insufficient documentation

## 2022-02-08 DIAGNOSIS — Z5111 Encounter for antineoplastic chemotherapy: Secondary | ICD-10-CM | POA: Insufficient documentation

## 2022-02-08 DIAGNOSIS — R197 Diarrhea, unspecified: Secondary | ICD-10-CM | POA: Insufficient documentation

## 2022-02-08 DIAGNOSIS — R232 Flushing: Secondary | ICD-10-CM | POA: Insufficient documentation

## 2022-02-08 DIAGNOSIS — Z923 Personal history of irradiation: Secondary | ICD-10-CM | POA: Insufficient documentation

## 2022-02-08 DIAGNOSIS — C773 Secondary and unspecified malignant neoplasm of axilla and upper limb lymph nodes: Secondary | ICD-10-CM | POA: Insufficient documentation

## 2022-02-08 DIAGNOSIS — Z5189 Encounter for other specified aftercare: Secondary | ICD-10-CM | POA: Insufficient documentation

## 2022-02-08 DIAGNOSIS — C50112 Malignant neoplasm of central portion of left female breast: Secondary | ICD-10-CM | POA: Insufficient documentation

## 2022-02-12 MED FILL — Fosaprepitant Dimeglumine For IV Infusion 150 MG (Base Eq): INTRAVENOUS | Qty: 5 | Status: AC

## 2022-02-12 MED FILL — Dexamethasone Sodium Phosphate Inj 100 MG/10ML: INTRAMUSCULAR | Qty: 1 | Status: AC

## 2022-02-13 ENCOUNTER — Inpatient Hospital Stay: Payer: Medicaid Other

## 2022-02-13 ENCOUNTER — Encounter: Payer: Self-pay | Admitting: Hematology

## 2022-02-13 ENCOUNTER — Inpatient Hospital Stay: Payer: Medicaid Other | Admitting: Hematology

## 2022-02-13 ENCOUNTER — Other Ambulatory Visit: Payer: Self-pay

## 2022-02-13 VITALS — BP 116/69 | HR 87 | Temp 97.9°F | Resp 18 | Wt 226.0 lb

## 2022-02-13 DIAGNOSIS — R232 Flushing: Secondary | ICD-10-CM | POA: Diagnosis not present

## 2022-02-13 DIAGNOSIS — C50112 Malignant neoplasm of central portion of left female breast: Secondary | ICD-10-CM

## 2022-02-13 DIAGNOSIS — Z171 Estrogen receptor negative status [ER-]: Secondary | ICD-10-CM

## 2022-02-13 DIAGNOSIS — R197 Diarrhea, unspecified: Secondary | ICD-10-CM | POA: Diagnosis not present

## 2022-02-13 DIAGNOSIS — Z923 Personal history of irradiation: Secondary | ICD-10-CM | POA: Diagnosis not present

## 2022-02-13 DIAGNOSIS — C773 Secondary and unspecified malignant neoplasm of axilla and upper limb lymph nodes: Secondary | ICD-10-CM | POA: Diagnosis not present

## 2022-02-13 DIAGNOSIS — Z95828 Presence of other vascular implants and grafts: Secondary | ICD-10-CM

## 2022-02-13 DIAGNOSIS — Z5189 Encounter for other specified aftercare: Secondary | ICD-10-CM | POA: Diagnosis not present

## 2022-02-13 DIAGNOSIS — Z5111 Encounter for antineoplastic chemotherapy: Secondary | ICD-10-CM | POA: Diagnosis not present

## 2022-02-13 LAB — COMPREHENSIVE METABOLIC PANEL
ALT: 34 U/L (ref 0–44)
AST: 23 U/L (ref 15–41)
Albumin: 3.9 g/dL (ref 3.5–5.0)
Alkaline Phosphatase: 141 U/L — ABNORMAL HIGH (ref 38–126)
Anion gap: 5 (ref 5–15)
BUN: 18 mg/dL (ref 6–20)
CO2: 27 mmol/L (ref 22–32)
Calcium: 8.9 mg/dL (ref 8.9–10.3)
Chloride: 106 mmol/L (ref 98–111)
Creatinine, Ser: 0.65 mg/dL (ref 0.44–1.00)
GFR, Estimated: >60 mL/min (ref >60)
Glucose, Bld: 102 mg/dL — ABNORMAL HIGH (ref 70–99)
Potassium: 4.2 mmol/L (ref 3.5–5.1)
Sodium: 138 mmol/L (ref 135–145)
Total Bilirubin: 0.2 mg/dL — ABNORMAL LOW (ref 0.3–1.2)
Total Protein: 7.1 g/dL (ref 6.5–8.1)

## 2022-02-13 LAB — CBC WITH DIFFERENTIAL/PLATELET
Abs Immature Granulocytes: 0.01 10*3/uL (ref 0.00–0.07)
Basophils Absolute: 0 10*3/uL (ref 0.0–0.1)
Basophils Relative: 1 %
Eosinophils Absolute: 0.1 10*3/uL (ref 0.0–0.5)
Eosinophils Relative: 3 %
HCT: 34.5 % — ABNORMAL LOW (ref 36.0–46.0)
Hemoglobin: 11.2 g/dL — ABNORMAL LOW (ref 12.0–15.0)
Immature Granulocytes: 0 %
Lymphocytes Relative: 21 %
Lymphs Abs: 0.8 10*3/uL (ref 0.7–4.0)
MCH: 29.9 pg (ref 26.0–34.0)
MCHC: 32.5 g/dL (ref 30.0–36.0)
MCV: 92 fL (ref 80.0–100.0)
Monocytes Absolute: 0.4 10*3/uL (ref 0.1–1.0)
Monocytes Relative: 10 %
Neutro Abs: 2.5 10*3/uL (ref 1.7–7.7)
Neutrophils Relative %: 65 %
Platelets: 387 10*3/uL (ref 150–400)
RBC: 3.75 MIL/uL — ABNORMAL LOW (ref 3.87–5.11)
RDW: 15.3 % (ref 11.5–15.5)
WBC: 3.8 10*3/uL — ABNORMAL LOW (ref 4.0–10.5)
nRBC: 0 % (ref 0.0–0.2)

## 2022-02-13 MED ORDER — DIPHENHYDRAMINE HCL 50 MG/ML IJ SOLN
INTRAMUSCULAR | Status: AC
  Filled 2022-02-13: qty 1

## 2022-02-13 MED ORDER — FAMOTIDINE IN NACL 20-0.9 MG/50ML-% IV SOLN
20.0000 mg | Freq: Once | INTRAVENOUS | Status: AC
  Administered 2022-02-13: 20 mg via INTRAVENOUS

## 2022-02-13 MED ORDER — ATROPINE SULFATE 1 MG/ML IV SOLN
0.5000 mg | Freq: Once | INTRAVENOUS | Status: AC | PRN
  Administered 2022-02-13: 0.5 mg via INTRAVENOUS

## 2022-02-13 MED ORDER — DIPHENOXYLATE-ATROPINE 2.5-0.025 MG PO TABS: 1.0000 | ORAL_TABLET | Freq: Four times a day (QID) | ORAL | 1 refills | Status: DC | PRN

## 2022-02-13 MED ORDER — SODIUM CHLORIDE 0.9 % IV SOLN
10.0000 mg | Freq: Once | INTRAVENOUS | Status: AC
  Administered 2022-02-13: 10 mg via INTRAVENOUS
  Filled 2022-02-13: qty 10

## 2022-02-13 MED ORDER — ACETAMINOPHEN 325 MG PO TABS
650.0000 mg | ORAL_TABLET | Freq: Once | ORAL | Status: AC
  Administered 2022-02-13: 650 mg via ORAL

## 2022-02-13 MED ORDER — ACETAMINOPHEN 325 MG PO TABS
ORAL_TABLET | ORAL | Status: AC
  Filled 2022-02-13: qty 2

## 2022-02-13 MED ORDER — FAMOTIDINE IN NACL 20-0.9 MG/50ML-% IV SOLN
INTRAVENOUS | Status: AC
  Filled 2022-02-13: qty 50

## 2022-02-13 MED ORDER — SODIUM CHLORIDE 0.9 % IV SOLN: Freq: Once | INTRAVENOUS | Status: AC

## 2022-02-13 MED ORDER — PALONOSETRON HCL INJECTION 0.25 MG/5ML
INTRAVENOUS | Status: DC
Start: 2022-02-13 — End: 2022-02-13
  Filled 2022-02-13: qty 5

## 2022-02-13 MED ORDER — SODIUM CHLORIDE 0.9% FLUSH
10.0000 mL | INTRAVENOUS | Status: DC | PRN
  Administered 2022-02-13: 10 mL

## 2022-02-13 MED ORDER — SODIUM CHLORIDE 0.9 % IV SOLN
8.0000 mg/kg | Freq: Once | INTRAVENOUS | Status: AC
  Administered 2022-02-13: 830 mg via INTRAVENOUS
  Filled 2022-02-13: qty 83

## 2022-02-13 MED ORDER — DIPHENHYDRAMINE HCL 50 MG/ML IJ SOLN
50.0000 mg | Freq: Once | INTRAMUSCULAR | Status: AC
  Administered 2022-02-13: 50 mg via INTRAVENOUS

## 2022-02-13 MED ORDER — ATROPINE SULFATE 1 MG/ML IV SOLN
INTRAVENOUS | Status: DC
Start: 2022-02-13 — End: 2022-02-13
  Filled 2022-02-13: qty 1

## 2022-02-13 MED ORDER — SODIUM CHLORIDE 0.9% FLUSH
10.0000 mL | Freq: Once | INTRAVENOUS | Status: AC
  Administered 2022-02-13: 10 mL

## 2022-02-13 MED ORDER — LIDOCAINE-PRILOCAINE 2.5-2.5 % EX CREA: 1.0000 | TOPICAL_CREAM | CUTANEOUS | 1 refills | Status: DC | PRN

## 2022-02-13 MED ORDER — PALONOSETRON HCL INJECTION 0.25 MG/5ML
0.2500 mg | Freq: Once | INTRAVENOUS | Status: AC
  Administered 2022-02-13: 0.25 mg via INTRAVENOUS

## 2022-02-13 MED ORDER — HEPARIN SOD (PORK) LOCK FLUSH 100 UNIT/ML IV SOLN
500.0000 [IU] | Freq: Once | INTRAVENOUS | Status: AC | PRN
  Administered 2022-02-13: 500 [IU]

## 2022-02-13 MED ORDER — SODIUM CHLORIDE 0.9 % IV SOLN
150.0000 mg | Freq: Once | INTRAVENOUS | Status: AC
  Administered 2022-02-13: 150 mg via INTRAVENOUS
  Filled 2022-02-13: qty 150

## 2022-02-13 NOTE — Patient Instructions (Signed)
[name]  Discharge Instructions: Thank you for choosing [facility] to provide your oncology and hematology care.   If you have a lab appointment with the [facility], please go directly to the [facility] and check in at the registration area.   Wear comfortable clothing and clothing appropriate for easy access to any Portacath or PICC line.   We strive to give you quality time with your provider. You may need to reschedule your appointment if you arrive late (15 or more minutes).  Arriving late affects you and other patients whose appointments are after yours.  Also, if you miss three or more appointments without notifying the office, you may be dismissed from the clinic at the provider's discretion.      For prescription refill requests, have your pharmacy contact our office and allow 72 hours for refills to be completed.    Today you received the following chemotherapy and/or immunotherapy agents :  Caitlyn Hendricks       To help prevent nausea and vomiting after your treatment, we encourage you to take your nausea medication as directed.  BELOW ARE SYMPTOMS THAT SHOULD BE REPORTED IMMEDIATELY: *FEVER GREATER THAN 100.4 F (38 C) OR HIGHER *CHILLS OR SWEATING *NAUSEA AND VOMITING THAT IS NOT CONTROLLED WITH YOUR NAUSEA MEDICATION *UNUSUAL SHORTNESS OF BREATH *UNUSUAL BRUISING OR BLEEDING *URINARY PROBLEMS (pain or burning when urinating, or frequent urination) *BOWEL PROBLEMS (unusual diarrhea, constipation, pain near the anus) TENDERNESS IN MOUTH AND THROAT WITH OR WITHOUT PRESENCE OF ULCERS (sore throat, sores in mouth, or a toothache) UNUSUAL RASH, SWELLING OR PAIN  UNUSUAL VAGINAL DISCHARGE OR ITCHING   Items with * indicate a potential emergency and should be followed up as soon as possible or go to the Emergency Department if any problems should occur.  Please show the CHEMOTHERAPY ALERT CARD or IMMUNOTHERAPY ALERT CARD at check-in  to the Emergency Department and triage nurse.  Should you have questions after your visit or need to cancel or reschedule your appointment, please contact [facility]  Dept: [phone]  and follow the prompts.  Office hours are 8:00 a.m. to 4:30 p.m. Monday - Friday. Please note that voicemails left after 4:00 p.m. may not be returned until the following business day.  We are closed weekends and major holidays. You have access to a nurse at all times for urgent questions. Please call the main number to the clinic Dept: [phone] and follow the prompts.   For any non-urgent questions, you may also contact your provider using MyChart. We now offer e-Visits for anyone 62 and older to request care online for non-urgent symptoms. For details visit [email or web address].   Also download the MyChart app! Go to the app store, search "MyChart", open the app, select [facility], and log in with your MyChart username and password.  Masks are optional in the cancer centers. If you would like for your care team to wear a mask while they are taking care of you, please let them know. For doctor visits, patients may have with them one support person who is at least 39 years old. At this time, visitors are not allowed in the infusion area.

## 2022-02-13 NOTE — Progress Notes (Signed)
[place]   [phone]) [phone] [phone]   Clinic Follow up Note   Patient Care Team: [name], MD as PCP - General (Family Medicine) Morrison Old, NP as PCP - Dermatology (Nurse Practitioner) Mauro Kaufmann, RN as Oncology Nurse Navigator Rockwell Germany, RN as Oncology Nurse Navigator [name], MD as Consulting Physician (General Surgery) Truitt Merle, MD as Consulting Physician (Hematology) Gery Pray, MD as Consulting Physician ([place]) Debby Bud, [place] (Physician Assistant)  Date of Service:  02/13/2022  CHIEF COMPLAINT: f/u of recurrent left breast cancer  CURRENT THERAPY:  Caitlyn Hendricks, started 09/17/21  ASSESSMENT & PLAN:  Caitlyn Hendricks is a 41 y.o. pre-menopausal female with   1. 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 Biopsy on 03/08/20 revealed invasive ductal carcinoma, triple negative, metastatic to her left axillary LN. CT CAP/Bone scan negative for distant mets.  -She completed neoadjuvant chemo with ddAC q2weeks for 4 cycles 03/22/20-05/03/20. Followed by weekly carbo/taxol for 12 weeks 05/17/20-08/16/20.  -she started [facility] for 1 year treatment on 04/28/20, but stopped in 10/2020 due to skin rash and arthralgia.   -s/p left mastectomy with Dr Donne Hazel on 09/20/20, path showed No residual invasive carcinoma in breast s/p neoadjuvant treatment, but 7/19 positive LN.  -she began concurrent chemoRT with oral Xeloda on 10/30/20 and completed radiation on 12/11/20. She developed a rash from Xeloda, and this was held from 5/16-6/27/22. When this was restarted, she again developed a rash and joint pain. It was switched to 5-FU on 01/18/21 for 4 cycles. During that time, she was diagnosed with Lupus and put on medication.  With the rash under control, she switched back to Xeloda and completed 6 months of treatment in 05/2021.  --she developed skin erythema with nodularity to left chest wall. Punch biopsy of the area on 08/28/21 revealed infiltrating carcinoma, consistent with breast origin. This was found to be Her2-. -PET scan on 09/13/21 showed: hypermetabolic left level IV, left supraclavicular, and right axillary adenopathy. -she started [employer] Caitlyn Hendricks) on 09/17/21. Aside from hospitalization for C.diff on 09/28/21, she has tolerated well overall, and her left chest discomfort and skin lesion have improved. -restaging CT CAP 12/25/21 showed NED.  -her treatment was changed to every 2 weeks due to bothersome diarrhea and fatigue, as well as neutropenia. She tolerated better with less intense diarrhea. -labs reviewed, overall stable. We will proceed to treatment today.   2. Diarrhea, Hot flashes -secondary to Caitlyn Hendricks -developed diarrhea around 09/24/21 after C1, hospitalized 3/24-3/30/23 with C.diff -diarrhea controlled with lomotil -gabapentin restarted 01/02/22   3. Cutaneous Lupus -She had several episodes of rash since she started chemo treatment; skin biopsy showed a drug-related dermatitis. The rash persisted on Keytruda, Xeloda, and 5-FU. -lab work with her dermatologist on 02/27/21 revealed cutaneous Lupus. She is now on medication for this. -since being on the medication for Lupus, her rash has greatly improved.   4. Anxiety, Social support, PTSD -She has a history of anxiety, currently well managed with sertraline and Xanax. -she is seeing therapist Stormy Card and psychiatrist Debby Bud, [place].  -She has very good social support from mother and boyfriend. 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 -she enrolled in [place] 2023   5. Genetic Testing negative for  pathogenetic mutations with VUS of POLE gene     PLAN: -proceed with C7  Trodelvy today at the same dose -lab, flush, f/u, and Trodelvy in 2 and 4 weeks as scheduled, will order restaging PET scan on next visit   No problem-specific Assessment & Plan notes found for this encounter.   SUMMARY OF ONCOLOGIC HISTORY: Oncology History Overview Note  Cancer Staging Cancer of central portion of left female breast [facility]) Staging form: Breast, AJCC 8th Edition - Clinical stage from 03/08/2020: Stage IIIB (cT2, cN1, cM0, G3, ER-, PR-, HER2-) - Signed by Truitt Merle, MD on 03/10/2020 Stage prefix: Initial diagnosis Histologic grading system: 3 grade system - Pathologic stage from 09/20/2020: No Stage Recommended (ypT0, pN2a, cM0, G3, ER-, PR-, HER2-) - Signed by Gardenia Phlegm, NP on 10/04/2020 Stage prefix: Post-therapy Histologic grading system: 3 grade system    Cancer of central portion of left female breast ([place])  02/23/2020 Mammogram   Diagnostic Mammogram 02/23/20  IMPRESSION The 2x1x2.6cm irregular mass in teh left breast at 12:00 posiiton middle depth is highly suspicious of malignancy. An [place] is recommended for further evaluation and biopsy planning purposes.   02/23/2020 Breast US   [name] 02/23/20  IMPRESSION 2 adjacent spiculated masses in the left brast at 12:00 position 3 cm from the nipple (2.1x0.9x1.1cm and 1.1x1.4x0.5cm) is suggestive of malignancy.   Multiple abnormal left subpectoral and left axillary nodes measuring 3.3x2.1 cm concerning for metastatic adenopathy.    Left breast skin thickening and edema may be secondary congestive edema due to extensive axillary adenopathy.    03/08/2020 Initial Biopsy   Diagnosis 1.Breast, left, needle core biopsy, 12:00 position, 3cmfn -INVASIVE DUCTAL CARCINOMA -SEE COMMENT  2. Lymph node, needle/core biopsy, left axilla -METASTATIC CARCINOMA INVOLVING A LYMPH NODE  -LYMPHOVASCULAR SPACE INVASION PRESENT   Microscopic Comment  1.Based on the biopsy the carcinoma appears Nottingham Grade 3 or  3 and measures 1 cm in the greatest linear extent.    03/08/2020 Receptors her2   ER- Negative 0% PR - Negative 0% HER2 - Negative  KI 67 - 80%    03/08/2020 Cancer Staging   Staging form: Breast, AJCC 8th Edition - Clinical stage from 03/08/2020: Stage IIIB (cT2, cN1, cM0, G3, ER-, PR-, HER2-) - Signed by Truitt Merle, MD on 03/10/2020   03/10/2020 Initial Diagnosis   Cancer of central portion of left female breast ([place])   03/16/2020 Breast MRI   IMPRESSION: 1. 8.1 x 7.8 x 6.6 cm biopsy proven invasive ductal carcinoma in the central right breast, involving 3 quadrants. 2. 3.0 x 1.7 x 1.1 cm satellite mass more inferiorly in lower inner quadrant of the left breast, compatible with additional malignancy. 3. Metastatic level 1 and level 2 left axillary lymph nodes. 4. No evidence of malignancy on the right.   03/17/2020 Imaging   IMPRESSION: CT CAP w contrast  1. Diffuse skin thickening in the left breast with left axillary and subpectoral lymphadenopathy, as well as a mildly enlarged left supraclavicular lymph node, which likely represents metastatic lymphadenopathy. No other definite extra nodal metastatic disease noted elsewhere in the chest, abdomen or pelvis. 2. Large mass in the central anatomic pelvis which is of uncertain origin, potentially a large exophytic fibroid or a large solid mass arising from the right ovary. Further evaluation with pelvic ultrasound is strongly recommended.   03/21/2020 Imaging   Bone Scan  IMPRESSION: Apparent arthropathy at L5. No bony metastatic disease is demonstrable on this study. Scattered foci  of abnormal uptake in a pelvic mass is of uncertain etiology given absence of calcification in this mass by CT. This mass compresses the urinary bladder. It is possible that some of the increased uptake in this area actually represents physiologic uptake within the bladder.   Kidneys noted in flank positions bilaterally.     03/22/2020 - 08/16/2020  Neo-Adjuvant Chemotherapy   Neoadjuvant Adriamycin and Cytoxan q2weeks for 4 cycles starting 03/22/20-05/03/20 followed by weekly Taxol and Carboplatin for 12 weeks starting 05/17/20-08/16/20   03/24/2020 Imaging   US Pelvis  IMPRESSION: 1. Large pedunculated lesion directly contiguous with the uterine most suggestive of a large subserosal fibroid which measures up to 8.2 cm. 2. Additional 1 cm probable intramural fibroid in the right anterior uterine body. 3. No other acute or worrisome pelvic abnormality.   03/29/2020 Genetic Testing   Negative genetic testing on the common hereditary cancer panel.  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  The Common Hereditary Gene Panel offered by [facility] includes sequencing and/or deletion duplication testing of the following 47 genes: APC, ATM, AXIN2, BARD1, BMPR1A, BRCA1, BRCA2, BRIP1, CDH1, CDK4, CDKN2A (p14ARF), CDKN2A (p16INK4a), CHEK2, CTNNA1, DICER1, EPCAM (Deletion/duplication testing only), GREM1 (promoter region deletion/duplication testing only), KIT, MEN1, MLH1, MSH2, MSH3, MSH6, MUTYH, NBN, NF1, NHTL1, PALB2, PDGFRA, PMS2, POLD1, POLE, PTEN, RAD50, RAD51C, RAD51D, SDHB, SDHC, SDHD, SMAD4, SMARCA4. STK11, TP53, TSC1, TSC2, and VHL.  The following genes were evaluated for sequence changes only: SDHA and HOXB13 c.251G>A variant only. The report date is 03/29/2020   04/28/2020 - 10/27/2020 Antibody Plan   Added Keytruda q3weeks starting 04/28/20 to complete 1 year of treatment. Held since 10/27/20 due to skin rash and body aches.    08/17/2020 Breast MRI   IMPRESSION: 1. The biopsy proven malignancy in the left breast and the adjacent satellite lesion have resolved in the interval. No abnormalities in these locations today. 2. No MRI evidence of malignancy in the right breast. 3. No adenopathy identified today   09/20/2020 Surgery   LEFT MODIFIED RADICAL MASTECTOMY by Dr Donne Hazel   09/20/2020 Pathology Results   FINAL MICROSCOPIC DIAGNOSIS:   A.  BREAST, LEFT, MODIFIED RADICAL MASTECTOMY:  - No residual invasive carcinoma in breast status post neoadjuvant  treatment.  - Metastatic carcinoma in (2) of (14) lymph nodes.  - Biopsy sites (one in breast, one in one of the positive lymph nodes).  - See oncology table.   B. ADDITIONAL AXILLARY CONTENTS, LEFT, DISSECTION:  - Metastatic carcinoma in (5) of (5) lymph nodes.    09/20/2020 Cancer Staging   Staging form: Breast, AJCC 8th Edition - Pathologic stage from 09/20/2020: No Stage Recommended (ypT0, pN2a, cM0, G3, ER-, PR-, HER2-) - Signed by Gardenia Phlegm, NP on 10/04/2020 Stage prefix: Post-therapy Histologic grading system: 3 grade system   10/30/2020 - 12/11/2020 Radiation Therapy   Adjuvant Radiation with Dr Sondra Come starting 10/30/20   10/30/2020 -  Chemotherapy   Adjuvant Xeloda starting 10/30/20 at 1573m BID M-F while on RT. Held since 11/20/2020 due to recurrent skin rash   01/18/2021 - 03/17/2021 Chemotherapy   Patient is on Treatment Plan : COLORECTAL 5FU / Leucovorin Modified [place] q14d x 12 cycles     03/28/2021 Imaging   CT CAP  IMPRESSION: 1. Status post left modified radical mastectomy and left axillary lymph node dissection, with no definitive findings to suggest metastatic disease in the chest, abdomen or pelvis on today's examination. Developing postradiation fibrosis in the apex of the left upper lobe  is new compared to the prior study, but typical in the setting of prior axillary radiation therapy. 2. There appears to be 2 lesions in the uterus, one of which appears slightly enlarged compared to the prior study, while the largest lesion on the prior examination has partially regressed, as detailed above. 3. Hepatic steatosis.   06/15/2021 Imaging   EXAM: CT CHEST, ABDOMEN, AND PELVIS WITH CONTRAST  IMPRESSION: 1. Stable exam. No evidence for metastatic disease in the chest, abdomen, or pelvis. 2. Interval evolution of post radiation scarring in  the left lung. 3. Uterine fibroids.   08/28/2021 Pathology Results   Diagnosis  Skin, left chest wall, punch  - INFILTRATING CARCINOMA, CONSISTENT WITH BREAST ORIGIN Microscopic Description Sections show an infiltrating tumor composed of cords and strands of single cells arranged in a back-to-back pattern with gland-like structures. The individual tumor cells are pleomorphic with enlarged nuclei, small nucleoli and an increased mitotic index. The surrounding stroma is collagenous. No connection is seen between the tumor and the overlying dermis.  NOTE: The tumor cells are strongly positive for Cytokeratin 7 and GATA-3 staining. The combined histologic and immunohistochemical findings, in addition to the clinical history, support a diagnosis of breast carcinoma involving the skin.   09/13/2021 PET scan   IMPRESSION: 1. Hypermetabolic left level IV lower neck adenopathy and left supraclavicular and right axillary adenopathy compatible with active malignancy. 2. New nodularity or nodular airspace opacities in the apical segment left upper lobe, and in subpleural portions of the left upper lobe and adjacent left lower lobe with low to moderate activity. This could reflect findings related to interval radiation therapy or possibly pneumonia. Malignancy is less likely given the configuration but not entirely excluded, and surveillance is suggested. 3. The soft tissue mass arising from the right uterine fundus has changed its orientation compared to prior exams, and measures 8.1 cm in long axis. This has mildly greater uptake than the myometrium with maximum SUV of 6.0. Prior characterization has suggested that this is likely a fibroid and the lesion does not appear to have substantially enlarged since earliest available comparison of 03/17/2020. Maximum SUV of 6.0 can be seen in uterine fibroids. The lesion is somewhat unusual in its size and appearance for fibroid, and surveillance is probably  reasonable. 4. Accentuated activity along the vaginal vestibule/perineum, without visible CT abnormality, probably from slight urinary incontinence of urinary FDG. 5. High activity along the distal Port-A-Cath catheter, with calcifications in the SVC in the vicinity of this catheter raising suspicion for chronic mural thrombus. The very high activity around the catheter likely relates to stasis of injected FDG along fibrin sheath/chronic calcification.   09/17/2021 -  Chemotherapy   Patient is on Treatment Plan : BREAST METASTATIC Sacituzumab govitecan-hziy Caitlyn Hendricks) q21d        INTERVAL HISTORY:  Caitlyn Hendricks is here for a follow up of breast cancer. She was last seen by me on 01/30/22. She was seen in the infusion area. She reports she had diarrhea again, but she notes it was intermittent and not as bad. She reports she feels much better overall and feels every 2 weeks is more tolerable.   All other systems were reviewed with the patient and are negative.  MEDICAL HISTORY:  Past Medical History:  Diagnosis Date   Anemia    Anxiety    Arthritis    Cancer ([place])    Left Breast   Depression    GERD (gastroesophageal reflux disease)    Headache  History of radiation therapy 10/30/20-12/11/20   left chest wall and axilla - Dr. Gery Pray   Panic attack     SURGICAL HISTORY: Past Surgical History:  Procedure Laterality Date   MASTECTOMY MODIFIED RADICAL Left 09/20/2020   Procedure: LEFT MODIFIED RADICAL MASTECTOMY;  Surgeon: [name], MD;  Location: [facility];  Service: General;  Laterality: Left;  RNFA; PEC BLOCK;   PORTACATH PLACEMENT Right 03/21/2020   Procedure: INSERTION PORT-A-CATH WITH ULTRASOUND GUIDANCE;  Surgeon: [name], MD;  Location: [facility];  Service: General;  Laterality: Right;    I have reviewed the social history and family history with the patient and they are unchanged from previous note.  ALLERGIES:  is allergic to  medroxyprogesterone and xeloda [capecitabine].  MEDICATIONS:  Current Outpatient Medications  Medication Sig Dispense Refill   ALPRAZolam (XANAX) 1 MG tablet Take 1 mg by mouth 4 (four) times daily as needed for anxiety.     baclofen (LIORESAL) 10 MG tablet TAKE 1 TABLET BY MOUTH TWICE A DAY AS NEEDED FOR MUSCLE SPASMS 60 tablet 0   dexamethasone (DECADRON) 4 MG tablet Take 2 tablets (8 mg) daily for 3 days after chemotherapy. Take with food. 30 tablet 1   diphenhydrAMINE (BENADRYL) 25 mg capsule Take 25 mg by mouth every 6 (six) hours as needed for allergies.     diphenoxylate-atropine (LOMOTIL) 2.5-0.025 MG tablet Take 1-2 tablets by mouth 4 (four) times daily as needed for diarrhea or loose stools. 45 tablet 1   Emollient (EUCERIN) lotion Apply 1 application. topically as needed for dry skin (itchyness (red cap)).     gabapentin (NEURONTIN) 100 MG capsule Take 1 capsule (100 mg total) by mouth 2 (two) times daily. OK to increase dose gradually to 361m twice daily 90 capsule 0   hydroxychloroquine (PLAQUENIL) 200 MG tablet Take 200 mg by mouth 2 (two) times daily.     ibuprofen (ADVIL) 200 MG tablet Take 800 mg by mouth every 8 (eight) hours as needed for moderate pain.     Ibuprofen-diphenhydrAMINE Cit (ADVIL PM PO) Take 1 capsule by mouth at bedtime as needed (sleep).     lidocaine-prilocaine (EMLA) cream Apply 1 application. topically as needed. 30 g 0   lidocaine-prilocaine (EMLA) cream Apply 1 Application topically as needed (port access). 30 g 1   pantoprazole (PROTONIX) 20 MG tablet TAKE 1 TABLET BY MOUTH EVERY DAY 90 tablet 1   prochlorperazine (COMPAZINE) 10 MG tablet Take 1 tablet (10 mg total) by mouth every 6 (six) hours as needed for nausea or vomiting. 30 tablet 1   sertraline (ZOLOFT) 50 MG tablet Take 50 mg by mouth every morning.     No current facility-administered medications for this visit.   Facility-Administered Medications Ordered in Other Visits  Medication Dose  Route Frequency Provider Last Rate Last Admin   acetaminophen (TYLENOL) 325 MG tablet            atropine 1 MG/ML injection            diphenhydrAMINE (BENADRYL) 50 MG/ML injection            famotidine (PEPCID) 20-0.9 MG/50ML-% IVPB            heparin lock flush 100 unit/mL  500 Units Intracatheter Once PRN FTruitt Merle MD       palonosetron (ALOXI) 0.25 MG/5ML injection            sacituzumab govitecan-hziy (TRODELVY) 830 mg in sodium chloride 0.9 % 250 mL (  2.4925 mg/mL) chemo infusion  8 mg/kg (Treatment Plan Recorded) Intravenous Once Truitt Merle, MD       sodium chloride flush (NS) 0.9 % injection 10 mL  10 mL Intravenous PRN Alla Feeling, NP   10 mL at 03/28/20 1104   sodium chloride flush (NS) 0.9 % injection 10 mL  10 mL Intracatheter PRN Truitt Merle, MD        PHYSICAL EXAMINATION: ECOG PERFORMANCE STATUS: 1 - Symptomatic but completely ambulatory  There were no vitals filed for this visit. Wt Readings from Last 3 Encounters:  02/13/22 226 lb (102.5 kg)  01/30/22 227 lb (103 kg)  01/09/22 231 lb 8 oz (105 kg)     GENERAL:alert, no distress and comfortable SKIN: skin color normal, no rashes or significant lesions EYES: normal, Conjunctiva are pink and non-injected, sclera clear  NEURO: alert & oriented x 3 with fluent speech  LABORATORY DATA:  I have reviewed the data as listed    Latest Ref Rng & Units 02/13/2022    8:29 AM 01/30/2022    9:03 AM 01/11/2022    2:49 PM  CBC  WBC 4.0 - 10.5 K/uL 3.8  4.8  2.5   Hemoglobin 12.0 - 15.0 g/dL 11.2  11.6  10.5   Hematocrit 36.0 - 46.0 % 34.5  35.7  32.2   Platelets 150 - 400 K/uL 387  324  374         Latest Ref Rng & Units 02/13/2022    8:29 AM 01/30/2022    9:03 AM 01/11/2022    2:49 PM  CMP  Glucose 70 - 99 mg/dL 102  111  125   BUN 6 - 20 mg/dL _0 Creatinine 0.44 - 1.00 mg/dL 0.65  0.72  0.73   Sodium 135 - 145 mmol/L 138  139  137   Potassium 3.5 - 5.1 mmol/L 4.2  3.8  4.3   Chloride 98 - 111 mmol/L 106  104   103   CO2 22 - 32 mmol/L _1 Calcium 8.9 - 10.3 mg/dL 8.9  9.1  9.6   Total Protein 6.5 - 8.1 g/dL 7.1  7.0  7.1   Total Bilirubin 0.3 - 1.2 mg/dL 0.2  0.3  0.3   Alkaline Phos 38 - 126 U/L 141  163  131   AST 15 - 41 U/L 23  44  37   ALT 0 - 44 U/L 34  51  48       RADIOGRAPHIC STUDIES: I have personally reviewed the radiological images as listed and agreed with the findings in the report. No results found.    No orders of the defined types were placed in this encounter.  All questions were answered. The patient knows to call the clinic with any problems, questions or concerns. No barriers to learning was detected. The total time spent in the appointment was 30 minutes.     Truitt Merle, MD 02/13/2022   I, Wilburn Mylar, am acting as scribe for Truitt Merle, MD.   I have reviewed the above documentation for accuracy and completeness, and I agree with the above.

## 2022-02-13 NOTE — Progress Notes (Signed)
Pt declined to stay for post 30 obs, ambulatory to lobby with VSS upon discharge 

## 2022-02-14 ENCOUNTER — Telehealth: Payer: Self-pay

## 2022-02-14 ENCOUNTER — Encounter: Payer: Self-pay | Admitting: General Practice

## 2022-02-14 NOTE — Progress Notes (Signed)
[place] Spiritual Care Note  Missed Liya in infusion, so followed up by phone. She had people visiting, so we plan to connect at her next treatment instead.   [place], [place], [facility] Pager [phone] Voicemail [phone]

## 2022-02-14 NOTE — Telephone Encounter (Signed)
T/C from pt asking to go back on her normal schedule for chemo.  She wants to start it next week with 2 weeks on and 2 weeks off.  She is currently schedule for 8/22.  Please advise

## 2022-02-15 ENCOUNTER — Other Ambulatory Visit: Payer: Self-pay | Admitting: Hematology

## 2022-02-18 ENCOUNTER — Telehealth: Payer: Self-pay | Admitting: Hematology

## 2022-02-18 NOTE — Telephone Encounter (Signed)
Scheduled per 8/14 in basket, message has been left

## 2022-02-20 ENCOUNTER — Other Ambulatory Visit: Payer: Self-pay | Admitting: Hematology

## 2022-02-20 ENCOUNTER — Other Ambulatory Visit: Payer: Self-pay

## 2022-02-20 ENCOUNTER — Inpatient Hospital Stay: Payer: Medicaid Other

## 2022-02-20 VITALS — BP 129/86 | HR 79 | Temp 98.7°F | Resp 18 | Ht 64.0 in | Wt 223.0 lb

## 2022-02-20 DIAGNOSIS — Z5111 Encounter for antineoplastic chemotherapy: Secondary | ICD-10-CM | POA: Diagnosis not present

## 2022-02-20 DIAGNOSIS — C50112 Malignant neoplasm of central portion of left female breast: Secondary | ICD-10-CM

## 2022-02-20 DIAGNOSIS — Z95828 Presence of other vascular implants and grafts: Secondary | ICD-10-CM

## 2022-02-20 LAB — CBC WITH DIFFERENTIAL/PLATELET
Abs Immature Granulocytes: 0.06 K/uL (ref 0.00–0.07)
Basophils Absolute: 0 K/uL (ref 0.0–0.1)
Basophils Relative: 1 %
Eosinophils Absolute: 0.1 K/uL (ref 0.0–0.5)
Eosinophils Relative: 5 %
HCT: 36.4 % (ref 36.0–46.0)
Hemoglobin: 12 g/dL (ref 12.0–15.0)
Immature Granulocytes: 4 %
Lymphocytes Relative: 34 %
Lymphs Abs: 0.5 K/uL — ABNORMAL LOW (ref 0.7–4.0)
MCH: 29.6 pg (ref 26.0–34.0)
MCHC: 33 g/dL (ref 30.0–36.0)
MCV: 89.9 fL (ref 80.0–100.0)
Monocytes Absolute: 0.3 K/uL (ref 0.1–1.0)
Monocytes Relative: 17 %
Neutro Abs: 0.6 K/uL — ABNORMAL LOW (ref 1.7–7.7)
Neutrophils Relative %: 39 %
Platelets: 359 K/uL (ref 150–400)
RBC: 4.05 MIL/uL (ref 3.87–5.11)
RDW: 15 % (ref 11.5–15.5)
Smear Review: NORMAL
WBC: 1.6 K/uL — ABNORMAL LOW (ref 4.0–10.5)
nRBC: 0 % (ref 0.0–0.2)

## 2022-02-20 LAB — COMPREHENSIVE METABOLIC PANEL
ALT: 57 U/L — ABNORMAL HIGH (ref 0–44)
AST: 45 U/L — ABNORMAL HIGH (ref 15–41)
Albumin: 4.2 g/dL (ref 3.5–5.0)
Alkaline Phosphatase: 153 U/L — ABNORMAL HIGH (ref 38–126)
Anion gap: 8 (ref 5–15)
BUN: 17 mg/dL (ref 6–20)
CO2: 27 mmol/L (ref 22–32)
Calcium: 9.9 mg/dL (ref 8.9–10.3)
Chloride: 104 mmol/L (ref 98–111)
Creatinine, Ser: 0.73 mg/dL (ref 0.44–1.00)
GFR, Estimated: >60 mL/min (ref >60)
Glucose, Bld: 114 mg/dL — ABNORMAL HIGH (ref 70–99)
Potassium: 3.9 mmol/L (ref 3.5–5.1)
Sodium: 139 mmol/L (ref 135–145)
Total Bilirubin: 0.3 mg/dL (ref 0.3–1.2)
Total Protein: 7.6 g/dL (ref 6.5–8.1)

## 2022-02-20 MED ORDER — SODIUM CHLORIDE 0.9% FLUSH
10.0000 mL | INTRAVENOUS | Status: DC | PRN
  Administered 2022-02-20: 10 mL

## 2022-02-20 MED ORDER — ATROPINE SULFATE 1 MG/ML IV SOLN
0.5000 mg | Freq: Once | INTRAVENOUS | Status: AC | PRN
  Administered 2022-02-20: 0.5 mg via INTRAVENOUS

## 2022-02-20 MED ORDER — ACETAMINOPHEN 325 MG PO TABS
650.0000 mg | ORAL_TABLET | Freq: Once | ORAL | Status: AC
  Administered 2022-02-20: 650 mg via ORAL

## 2022-02-20 MED ORDER — SODIUM CHLORIDE 0.9 % IV SOLN
10.0000 mg | Freq: Once | INTRAVENOUS | Status: AC
  Administered 2022-02-20: 10 mg via INTRAVENOUS
  Filled 2022-02-20: qty 10

## 2022-02-20 MED ORDER — SODIUM CHLORIDE 0.9 % IV SOLN: Freq: Once | INTRAVENOUS | Status: AC

## 2022-02-20 MED ORDER — ATROPINE SULFATE 1 MG/ML IV SOLN
INTRAVENOUS | Status: AC
  Filled 2022-02-20: qty 1

## 2022-02-20 MED ORDER — PALONOSETRON HCL INJECTION 0.25 MG/5ML
0.2500 mg | Freq: Once | INTRAVENOUS | Status: AC
  Administered 2022-02-20: 0.25 mg via INTRAVENOUS

## 2022-02-20 MED ORDER — DIPHENHYDRAMINE HCL 50 MG/ML IJ SOLN
50.0000 mg | Freq: Once | INTRAMUSCULAR | Status: AC
  Administered 2022-02-20: 50 mg via INTRAVENOUS

## 2022-02-20 MED ORDER — PALONOSETRON HCL INJECTION 0.25 MG/5ML
INTRAVENOUS | Status: AC
  Filled 2022-02-20: qty 5

## 2022-02-20 MED ORDER — HEPARIN SOD (PORK) LOCK FLUSH 100 UNIT/ML IV SOLN
500.0000 [IU] | Freq: Once | INTRAVENOUS | Status: AC | PRN
  Administered 2022-02-20: 500 [IU]

## 2022-02-20 MED ORDER — DIPHENHYDRAMINE HCL 50 MG/ML IJ SOLN
INTRAMUSCULAR | Status: AC
  Filled 2022-02-20: qty 1

## 2022-02-20 MED ORDER — SODIUM CHLORIDE 0.9 % IV SOLN
6.0000 mg/kg | Freq: Once | INTRAVENOUS | Status: AC
  Administered 2022-02-20: 620 mg via INTRAVENOUS
  Filled 2022-02-20: qty 62

## 2022-02-20 MED ORDER — SODIUM CHLORIDE 0.9 % IV SOLN
150.0000 mg | Freq: Once | INTRAVENOUS | Status: AC
  Administered 2022-02-20: 150 mg via INTRAVENOUS
  Filled 2022-02-20: qty 150

## 2022-02-20 MED ORDER — ACETAMINOPHEN 325 MG PO TABS
ORAL_TABLET | ORAL | Status: AC
  Filled 2022-02-20: qty 2

## 2022-02-20 MED ORDER — FAMOTIDINE IN NACL 20-0.9 MG/50ML-% IV SOLN
20.0000 mg | Freq: Once | INTRAVENOUS | Status: AC
  Administered 2022-02-20: 20 mg via INTRAVENOUS

## 2022-02-20 MED ORDER — FAMOTIDINE IN NACL 20-0.9 MG/50ML-% IV SOLN
INTRAVENOUS | Status: AC
  Filled 2022-02-20: qty 50

## 2022-02-20 MED ORDER — SODIUM CHLORIDE 0.9% FLUSH
10.0000 mL | Freq: Once | INTRAVENOUS | Status: AC
  Administered 2022-02-20: 10 mL

## 2022-02-20 NOTE — Progress Notes (Signed)
D1/C8 ANC 0.6. Dr. [name] Notified. OK to treat today

## 2022-02-21 ENCOUNTER — Ambulatory Visit: Payer: Medicaid Other

## 2022-02-21 ENCOUNTER — Other Ambulatory Visit: Payer: Self-pay | Admitting: Hematology

## 2022-02-21 ENCOUNTER — Inpatient Hospital Stay (HOSPITAL_BASED_OUTPATIENT_CLINIC_OR_DEPARTMENT_OTHER): Payer: Medicaid Other

## 2022-02-21 VITALS — BP 126/89 | HR 86 | Temp 98.8°F | Resp 20

## 2022-02-21 DIAGNOSIS — C50112 Malignant neoplasm of central portion of left female breast: Secondary | ICD-10-CM

## 2022-02-21 DIAGNOSIS — Z5111 Encounter for antineoplastic chemotherapy: Secondary | ICD-10-CM | POA: Diagnosis not present

## 2022-02-21 DIAGNOSIS — Z95828 Presence of other vascular implants and grafts: Secondary | ICD-10-CM

## 2022-02-21 MED ORDER — PEGFILGRASTIM-CBQV 6 MG/0.6ML ~~LOC~~ SOSY
6.0000 mg | PREFILLED_SYRINGE | Freq: Once | SUBCUTANEOUS | Status: AC
  Administered 2022-02-21: 6 mg via SUBCUTANEOUS
  Filled 2022-02-21: qty 0.6

## 2022-02-26 ENCOUNTER — Inpatient Hospital Stay: Payer: Medicaid Other

## 2022-02-26 ENCOUNTER — Inpatient Hospital Stay: Payer: Medicaid Other | Admitting: Hematology

## 2022-02-26 ENCOUNTER — Other Ambulatory Visit: Payer: Self-pay | Admitting: Hematology

## 2022-02-28 ENCOUNTER — Other Ambulatory Visit: Payer: Self-pay

## 2022-03-06 ENCOUNTER — Telehealth: Payer: Self-pay

## 2022-03-06 MED FILL — Dexamethasone Sodium Phosphate Inj 100 MG/10ML: INTRAMUSCULAR | Qty: 1 | Status: AC

## 2022-03-06 MED FILL — Fosaprepitant Dimeglumine For IV Infusion 150 MG (Base Eq): INTRAVENOUS | Qty: 5 | Status: AC

## 2022-03-06 NOTE — Telephone Encounter (Signed)
Pt called asking in Dr. [name] could please call her.  Pt stated she is scheduled to see Dr. [name] on 03/07/2022 and her mom is coming with her to the appt but there are some things that she would like to discuss with Dr. [name] in private.  Pt would like to speak with Dr. [name] is possible today via telephone.  Notified Dr. [name] of the pt's request.

## 2022-03-07 ENCOUNTER — Encounter: Payer: Self-pay | Admitting: Hematology

## 2022-03-07 ENCOUNTER — Inpatient Hospital Stay: Payer: Medicaid Other

## 2022-03-07 ENCOUNTER — Inpatient Hospital Stay: Payer: Medicaid Other | Admitting: Hematology

## 2022-03-07 ENCOUNTER — Other Ambulatory Visit: Payer: Self-pay

## 2022-03-07 ENCOUNTER — Encounter: Payer: Self-pay | Admitting: General Practice

## 2022-03-07 VITALS — BP 120/86 | HR 91 | Resp 18

## 2022-03-07 VITALS — BP 119/90 | HR 95 | Temp 98.4°F | Resp 17 | Ht 64.0 in | Wt 221.0 lb

## 2022-03-07 DIAGNOSIS — Z171 Estrogen receptor negative status [ER-]: Secondary | ICD-10-CM

## 2022-03-07 DIAGNOSIS — C50112 Malignant neoplasm of central portion of left female breast: Secondary | ICD-10-CM

## 2022-03-07 DIAGNOSIS — Z5111 Encounter for antineoplastic chemotherapy: Secondary | ICD-10-CM | POA: Diagnosis not present

## 2022-03-07 DIAGNOSIS — Z95828 Presence of other vascular implants and grafts: Secondary | ICD-10-CM

## 2022-03-07 LAB — CMP (CANCER CENTER ONLY)
ALT: 58 U/L — ABNORMAL HIGH (ref 0–44)
AST: 46 U/L — ABNORMAL HIGH (ref 15–41)
Albumin: 4.3 g/dL (ref 3.5–5.0)
Alkaline Phosphatase: 159 U/L — ABNORMAL HIGH (ref 38–126)
Anion gap: 6 (ref 5–15)
BUN: 11 mg/dL (ref 6–20)
CO2: 28 mmol/L (ref 22–32)
Calcium: 10 mg/dL (ref 8.9–10.3)
Chloride: 106 mmol/L (ref 98–111)
Creatinine: 0.78 mg/dL (ref 0.44–1.00)
GFR, Estimated: >60 mL/min (ref >60)
Glucose, Bld: 116 mg/dL — ABNORMAL HIGH (ref 70–99)
Potassium: 4 mmol/L (ref 3.5–5.1)
Sodium: 140 mmol/L (ref 135–145)
Total Bilirubin: 0.6 mg/dL (ref 0.3–1.2)
Total Protein: 7.4 g/dL (ref 6.5–8.1)

## 2022-03-07 LAB — CBC WITH DIFFERENTIAL (CANCER CENTER ONLY)
Abs Immature Granulocytes: 0.01 10*3/uL (ref 0.00–0.07)
Basophils Absolute: 0 10*3/uL (ref 0.0–0.1)
Basophils Relative: 1 %
Eosinophils Absolute: 0 10*3/uL (ref 0.0–0.5)
Eosinophils Relative: 1 %
HCT: 36 % (ref 36.0–46.0)
Hemoglobin: 11.9 g/dL — ABNORMAL LOW (ref 12.0–15.0)
Immature Granulocytes: 0 %
Lymphocytes Relative: 12 %
Lymphs Abs: 0.7 10*3/uL (ref 0.7–4.0)
MCH: 29.8 pg (ref 26.0–34.0)
MCHC: 33.1 g/dL (ref 30.0–36.0)
MCV: 90 fL (ref 80.0–100.0)
Monocytes Absolute: 0.4 10*3/uL (ref 0.1–1.0)
Monocytes Relative: 6 %
Neutro Abs: 4.8 10*3/uL (ref 1.7–7.7)
Neutrophils Relative %: 80 %
Platelet Count: 391 10*3/uL (ref 150–400)
RBC: 4 MIL/uL (ref 3.87–5.11)
RDW: 17 % — ABNORMAL HIGH (ref 11.5–15.5)
WBC Count: 6 10*3/uL (ref 4.0–10.5)
nRBC: 0 % (ref 0.0–0.2)

## 2022-03-07 LAB — MAGNESIUM: Magnesium: 1.6 mg/dL — ABNORMAL LOW (ref 1.7–2.4)

## 2022-03-07 LAB — PHOSPHORUS: Phosphorus: 3.5 mg/dL (ref 2.5–4.6)

## 2022-03-07 MED ORDER — HEPARIN SOD (PORK) LOCK FLUSH 100 UNIT/ML IV SOLN
500.0000 [IU] | Freq: Once | INTRAVENOUS | Status: AC | PRN
  Administered 2022-03-07: 500 [IU]

## 2022-03-07 MED ORDER — DIPHENHYDRAMINE HCL 50 MG/ML IJ SOLN
25.0000 mg | Freq: Once | INTRAMUSCULAR | Status: AC
  Administered 2022-03-07: 25 mg via INTRAVENOUS
  Filled 2022-03-07: qty 1

## 2022-03-07 MED ORDER — ACETAMINOPHEN 325 MG PO TABS
650.0000 mg | ORAL_TABLET | Freq: Once | ORAL | Status: AC
  Administered 2022-03-07: 650 mg via ORAL
  Filled 2022-03-07: qty 2

## 2022-03-07 MED ORDER — SODIUM CHLORIDE 0.9 % IV SOLN
810.0000 mg | Freq: Once | INTRAVENOUS | Status: AC
  Administered 2022-03-07: 810 mg via INTRAVENOUS
  Filled 2022-03-07: qty 81

## 2022-03-07 MED ORDER — SODIUM CHLORIDE 0.9 % IV SOLN: Freq: Once | INTRAVENOUS | Status: AC

## 2022-03-07 MED ORDER — SODIUM CHLORIDE 0.9 % IV SOLN
10.0000 mg | Freq: Once | INTRAVENOUS | Status: AC
  Administered 2022-03-07: 10 mg via INTRAVENOUS
  Filled 2022-03-07: qty 10

## 2022-03-07 MED ORDER — ATROPINE SULFATE 1 MG/ML IV SOLN
0.5000 mg | Freq: Once | INTRAVENOUS | Status: AC | PRN
  Administered 2022-03-07: 0.5 mg via INTRAVENOUS
  Filled 2022-03-07: qty 1

## 2022-03-07 MED ORDER — SODIUM CHLORIDE 0.9 % IV SOLN
150.0000 mg | Freq: Once | INTRAVENOUS | Status: AC
  Administered 2022-03-07: 150 mg via INTRAVENOUS
  Filled 2022-03-07: qty 150

## 2022-03-07 MED ORDER — FAMOTIDINE IN NACL 20-0.9 MG/50ML-% IV SOLN
20.0000 mg | Freq: Once | INTRAVENOUS | Status: AC
  Administered 2022-03-07: 20 mg via INTRAVENOUS
  Filled 2022-03-07: qty 50

## 2022-03-07 MED ORDER — SODIUM CHLORIDE 0.9% FLUSH
10.0000 mL | Freq: Once | INTRAVENOUS | Status: AC
  Administered 2022-03-07: 10 mL

## 2022-03-07 MED ORDER — PALONOSETRON HCL INJECTION 0.25 MG/5ML
0.2500 mg | Freq: Once | INTRAVENOUS | Status: AC
  Administered 2022-03-07: 0.25 mg via INTRAVENOUS
  Filled 2022-03-07: qty 5

## 2022-03-07 MED ORDER — SODIUM CHLORIDE 0.9% FLUSH
10.0000 mL | INTRAVENOUS | Status: DC | PRN
  Administered 2022-03-07: 10 mL

## 2022-03-07 MED ORDER — MAGNESIUM SULFATE 2 GM/50ML IV SOLN
2.0000 g | Freq: Once | INTRAVENOUS | Status: AC
  Administered 2022-03-07: 2 g via INTRAVENOUS
  Filled 2022-03-07: qty 50

## 2022-03-07 NOTE — Progress Notes (Signed)
[place]   [phone] [phone]   Clinic Follow up Note   Patient Care Team: [name], MD as PCP - General (Family Medicine) Morrison Old, NP as PCP - Dermatology (Nurse Practitioner) Mauro Kaufmann, RN as Oncology Nurse Navigator Rockwell Germany, RN as Oncology Nurse Navigator [name], MD as Consulting Physician (General Surgery) Truitt Merle, MD as Consulting Physician (Hematology) Gery Pray, MD as Consulting Physician ([facility]) Debby Bud, [place] (Physician Assistant)  Date of Service:  03/07/2022  CHIEF COMPLAINT: f/u of recurrent left breast cancer  CURRENT THERAPY:  Caitlyn Hendricks, started 09/17/21  ASSESSMENT & PLAN:  Caitlyn Hendricks is a 41 y.o. pre-menopausal female with   1. 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 Biopsy on 03/08/20 revealed invasive ductal carcinoma, triple negative, metastatic to her left axillary LN. CT CAP/Bone scan negative for distant mets.  -She completed neoadjuvant chemo with ddAC q2weeks for 4 cycles 03/22/20-05/03/20. Followed by weekly carbo/taxol for 12 weeks 05/17/20-08/16/20.  -she started Ballard Russell for 1 year treatment on 04/28/20, but stopped in 10/2020 due to skin rash and arthralgia.   -s/p left mastectomy with Dr Donne Hazel on 09/20/20, path showed No residual invasive carcinoma in breast s/p neoadjuvant treatment, but 7/19 positive LN.  -she began concurrent chemoRT with oral Xeloda on 10/30/20 and completed radiation on 12/11/20. She developed a rash from Xeloda, and this was held from 5/16-6/27/22. When this was restarted, she again developed a rash and joint pain. It was switched to 5-FU on 01/18/21 for 4 cycles. During that time, she was diagnosed with Lupus and put on medication.  With the rash under control, she switched back to Xeloda and completed 6 months of treatment in 05/2021.  --she developed skin erythema with nodularity to left chest wall. Punch biopsy of the area on 08/28/21 revealed infiltrating carcinoma, consistent with breast origin. This was found to be Her2-. -PET scan on 09/13/21 showed: hypermetabolic left level IV, left supraclavicular, and right axillary adenopathy. -she started [employer] Caitlyn Hendricks) on 09/17/21. Aside from hospitalization for C.diff on 09/28/21, she has tolerated well overall, and her left chest discomfort and skin lesion have improved. -restaging CT CAP 12/25/21 showed NED.  -we changed her treatment back to two weeks one, one week off, with reduced dose on day 1 and more on day 8. She is tolerating better. -labs reviewed, overall stable. We will proceed to treatment today.   2. Diarrhea, Hot flashes -secondary to Caitlyn Hendricks -developed diarrhea around 09/24/21 after C1, hospitalized 3/24-3/30/23 with C.diff -diarrhea controlled with lomotil -gabapentin restarted 01/02/22   3. Cutaneous Lupus -She had several episodes of rash since she started chemo treatment; skin biopsy showed a drug-related dermatitis. The rash persisted on Keytruda, Xeloda, and 5-FU. -lab work with her dermatologist on 02/27/21 revealed cutaneous Lupus. She is now on medication for this. -since being on the medication for Lupus, her rash has greatly improved.   4. Anxiety, Social support, PTSD -She has a history of anxiety, currently well managed with sertraline and Xanax. -she is seeing therapist Stormy Card and psychiatrist Debby Bud, [place].  -She has very good social support from mother and boyfriend. 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 -she enrolled in [place] 2023   5. Genetic Testing  negative for pathogenetic mutations with VUS of POLE gene     PLAN: -proceed with C8 Trodelvy today and in 1  week at the same reduced doses, Udenyca on day 10 -f/u in 3 weeks, with lab and PET several days before, will do lab and flush on day of PET  -will refer her to [place], she plan to relocate in late Sep    No problem-specific Assessment & Plan notes found for this encounter.   SUMMARY OF ONCOLOGIC HISTORY: Oncology History Overview Note  Cancer Staging Cancer of central portion of left female breast [facility]) Staging form: Breast, AJCC 8th Edition - Clinical stage from 03/08/2020: Stage IIIB (cT2, cN1, cM0, G3, ER-, PR-, HER2-) - Signed by Truitt Merle, MD on 03/10/2020 Stage prefix: Initial diagnosis Histologic grading system: 3 grade system - Pathologic stage from 09/20/2020: No Stage Recommended (ypT0, pN2a, cM0, G3, ER-, PR-, HER2-) - Signed by Gardenia Phlegm, NP on 10/04/2020 Stage prefix: Post-therapy Histologic grading system: 3 grade system    Cancer of central portion of left female breast ([place])  02/23/2020 Mammogram   Diagnostic Mammogram 02/23/20  IMPRESSION The 2x1x2.6cm irregular mass in teh left breast at 12:00 posiiton middle depth is highly suspicious of malignancy. An [place] is recommended for further evaluation and biopsy planning purposes.   02/23/2020 Breast US   [name] 02/23/20  IMPRESSION 2 adjacent spiculated masses in the left brast at 12:00 position 3 cm from the nipple (2.1x0.9x1.1cm and 1.1x1.4x0.5cm) is suggestive of malignancy.   Multiple abnormal left subpectoral and left axillary nodes measuring 3.3x2.1 cm concerning for metastatic adenopathy.    Left breast skin thickening and edema may be secondary congestive edema due to extensive axillary adenopathy.    03/08/2020 Initial Biopsy   Diagnosis 1.Breast, left, needle core biopsy, 12:00 position, 3cmfn -INVASIVE DUCTAL CARCINOMA -SEE COMMENT  2. Lymph node, needle/core biopsy, left axilla -METASTATIC CARCINOMA INVOLVING A LYMPH NODE  -LYMPHOVASCULAR SPACE INVASION PRESENT   Microscopic  Comment  1.Based on the biopsy the carcinoma appears Nottingham Grade 3 or 3 and measures 1 cm in the greatest linear extent.    03/08/2020 Receptors her2   ER- Negative 0% PR - Negative 0% HER2 - Negative  KI 67 - 80%    03/08/2020 Cancer Staging   Staging form: Breast, AJCC 8th Edition - Clinical stage from 03/08/2020: Stage IIIB (cT2, cN1, cM0, G3, ER-, PR-, HER2-) - Signed by Truitt Merle, MD on 03/10/2020   03/10/2020 Initial Diagnosis   Cancer of central portion of left female breast ([place])   03/16/2020 Breast MRI   IMPRESSION: 1. 8.1 x 7.8 x 6.6 cm biopsy proven invasive ductal carcinoma in the central right breast, involving 3 quadrants. 2. 3.0 x 1.7 x 1.1 cm satellite mass more inferiorly in lower inner quadrant of the left breast, compatible with additional malignancy. 3. Metastatic level 1 and level 2 left axillary lymph nodes. 4. No evidence of malignancy on the right.   03/17/2020 Imaging   IMPRESSION: CT CAP w contrast  1. Diffuse skin thickening in the left breast with left axillary and subpectoral lymphadenopathy, as well as a mildly enlarged left supraclavicular lymph node, which likely represents metastatic lymphadenopathy. No other definite extra nodal metastatic disease noted elsewhere in the chest, abdomen or pelvis. 2. Large mass in the central anatomic pelvis which is of uncertain origin, potentially a large exophytic fibroid or a large solid mass arising from the right ovary. Further evaluation with pelvic ultrasound is strongly recommended.  03/21/2020 Imaging   Bone Scan  IMPRESSION: Apparent arthropathy at L5. No bony metastatic disease is demonstrable on this study. Scattered foci of abnormal uptake in a pelvic mass is of uncertain etiology given absence of calcification in this mass by CT. This mass compresses the urinary bladder. It is possible that some of the increased uptake in this area actually represents physiologic uptake within the bladder.    Kidneys noted in flank positions bilaterally.     03/22/2020 - 08/16/2020 Neo-Adjuvant Chemotherapy   Neoadjuvant Adriamycin and Cytoxan q2weeks for 4 cycles starting 03/22/20-05/03/20 followed by weekly Taxol and Carboplatin for 12 weeks starting 05/17/20-08/16/20   03/24/2020 Imaging   US Pelvis  IMPRESSION: 1. Large pedunculated lesion directly contiguous with the uterine most suggestive of a large subserosal fibroid which measures up to 8.2 cm. 2. Additional 1 cm probable intramural fibroid in the right anterior uterine body. 3. No other acute or worrisome pelvic abnormality.   03/29/2020 Genetic Testing   Negative genetic testing on the common hereditary cancer panel.  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  The Common Hereditary Gene Panel offered by [facility] includes sequencing and/or deletion duplication testing of the following 47 genes: APC, ATM, AXIN2, BARD1, BMPR1A, BRCA1, BRCA2, BRIP1, CDH1, CDK4, CDKN2A (p14ARF), CDKN2A (p16INK4a), CHEK2, CTNNA1, DICER1, EPCAM (Deletion/duplication testing only), GREM1 (promoter region deletion/duplication testing only), KIT, MEN1, MLH1, MSH2, MSH3, MSH6, MUTYH, NBN, NF1, NHTL1, PALB2, PDGFRA, PMS2, POLD1, POLE, PTEN, RAD50, RAD51C, RAD51D, SDHB, SDHC, SDHD, SMAD4, SMARCA4. STK11, TP53, TSC1, TSC2, and VHL.  The following genes were evaluated for sequence changes only: SDHA and HOXB13 c.251G>A variant only. The report date is 03/29/2020   04/28/2020 - 10/27/2020 Antibody Plan   Added Keytruda q3weeks starting 04/28/20 to complete 1 year of treatment. Held since 10/27/20 due to skin rash and body aches.    08/17/2020 Breast MRI   IMPRESSION: 1. The biopsy proven malignancy in the left breast and the adjacent satellite lesion have resolved in the interval. No abnormalities in these locations today. 2. No MRI evidence of malignancy in the right breast. 3. No adenopathy identified today   09/20/2020 Surgery   LEFT MODIFIED RADICAL MASTECTOMY by Dr  Donne Hazel   09/20/2020 Pathology Results   FINAL MICROSCOPIC DIAGNOSIS:   A. BREAST, LEFT, MODIFIED RADICAL MASTECTOMY:  - No residual invasive carcinoma in breast status post neoadjuvant  treatment.  - Metastatic carcinoma in (2) of (14) lymph nodes.  - Biopsy sites (one in breast, one in one of the positive lymph nodes).  - See oncology table.   B. ADDITIONAL AXILLARY CONTENTS, LEFT, DISSECTION:  - Metastatic carcinoma in (5) of (5) lymph nodes.    09/20/2020 Cancer Staging   Staging form: Breast, AJCC 8th Edition - Pathologic stage from 09/20/2020: No Stage Recommended (ypT0, pN2a, cM0, G3, ER-, PR-, HER2-) - Signed by Gardenia Phlegm, NP on 10/04/2020 Stage prefix: Post-therapy Histologic grading system: 3 grade system   10/30/2020 - 12/11/2020 Radiation Therapy   Adjuvant Radiation with Dr Sondra Come starting 10/30/20   10/30/2020 -  Chemotherapy   Adjuvant Xeloda starting 10/30/20 at 1559m BID M-F while on RT. Held since 11/20/2020 due to recurrent skin rash   01/18/2021 - 03/17/2021 Chemotherapy   Patient is on Treatment Plan : COLORECTAL 5FU / Leucovorin Modified [place] q14d x 12 cycles     03/28/2021 Imaging   CT CAP  IMPRESSION: 1. Status post left modified radical mastectomy and left axillary lymph node dissection, with no definitive findings to  suggest metastatic disease in the chest, abdomen or pelvis on today's examination. Developing postradiation fibrosis in the apex of the left upper lobe is new compared to the prior study, but typical in the setting of prior axillary radiation therapy. 2. There appears to be 2 lesions in the uterus, one of which appears slightly enlarged compared to the prior study, while the largest lesion on the prior examination has partially regressed, as detailed above. 3. Hepatic steatosis.   06/15/2021 Imaging   EXAM: CT CHEST, ABDOMEN, AND PELVIS WITH CONTRAST  IMPRESSION: 1. Stable exam. No evidence for metastatic disease in the  chest, abdomen, or pelvis. 2. 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 3. Uterine fibroids.   08/28/2021 Pathology Results   Diagnosis  Skin, left chest wall, punch  - INFILTRATING CARCINOMA, CONSISTENT WITH BREAST ORIGIN Microscopic Description Sections show an infiltrating tumor composed of cords and strands of single cells arranged in a back-to-back pattern with gland-like structures. The individual tumor cells are pleomorphic with enlarged nuclei, small nucleoli and an increased mitotic index. The surrounding stroma is collagenous. No connection is seen between the tumor and the overlying dermis.  NOTE: The tumor cells are strongly positive for Cytokeratin 7 and GATA-3 staining. The combined histologic and immunohistochemical findings, in addition to the clinical history, support a diagnosis of breast carcinoma involving the skin.   09/13/2021 PET scan   IMPRESSION: 1. Hypermetabolic left level IV lower neck adenopathy and left supraclavicular and right axillary adenopathy compatible with active malignancy. 2. New nodularity or nodular airspace opacities in the apical segment left upper lobe, and in subpleural portions of the left upper lobe and adjacent left lower lobe with low to moderate activity. This could reflect findings related to interval radiation therapy or possibly pneumonia. Malignancy is less likely given the configuration but not entirely excluded, and surveillance is suggested. 3. The soft tissue mass arising from the right uterine fundus has changed its orientation compared to prior exams, and measures 8.1 cm in long axis. This has mildly greater uptake than the myometrium with maximum SUV of 6.0. Prior characterization has suggested that this is likely a fibroid and the lesion does not appear to have substantially enlarged since earliest available comparison of 03/17/2020. Maximum SUV of 6.0 can be seen in uterine fibroids. The lesion is somewhat  unusual in its size and appearance for fibroid, and surveillance is probably reasonable. 4. Accentuated activity along the vaginal vestibule/perineum, without visible CT abnormality, probably from slight urinary incontinence of urinary FDG. 5. High activity along the distal Port-A-Cath catheter, with calcifications in the SVC in the vicinity of this catheter raising suspicion for chronic mural thrombus. The very high activity around the catheter likely relates to stasis of injected FDG along fibrin sheath/chronic calcification.   09/17/2021 - 02/20/2022 Chemotherapy   Patient is on Treatment Plan : BREAST METASTATIC Sacituzumab govitecan-hziy Caitlyn Hendricks) q21d     09/17/2021 -  Chemotherapy   Patient is on Treatment Plan : BREAST METASTATIC Sacituzumab govitecan-hziy Caitlyn Hendricks) D1,8 q21d        INTERVAL HISTORY:  Caitlyn Hendricks is here for a follow up of breast cancer. She was last seen by me on 02/13/22. She presents to the clinic accompanied by her mother. She reports she has recovered well from chemo. She reports intermittent hip pain and stiffness that goes down her legs.   All other systems were reviewed with the patient and are negative.  MEDICAL HISTORY:  Past Medical History:  Diagnosis Date   Anemia    Anxiety    Arthritis    Cancer ([place])    Left Breast   Depression    GERD (gastroesophageal reflux disease)    Headache    History of radiation therapy 10/30/20-12/11/20   left chest wall and axilla - Dr. Gery Pray   Panic attack     SURGICAL HISTORY: Past Surgical History:  Procedure Laterality Date   MASTECTOMY MODIFIED RADICAL Left 09/20/2020   Procedure: LEFT MODIFIED RADICAL MASTECTOMY;  Surgeon: [name], MD;  Location: [facility];  Service: General;  Laterality: Left;  RNFA; PEC BLOCK;   PORTACATH PLACEMENT Right 03/21/2020   Procedure: INSERTION PORT-A-CATH WITH ULTRASOUND GUIDANCE;  Surgeon: [name], MD;  Location: [facility];   Service: General;  Laterality: Right;    I have reviewed the social history and family history with the patient and they are unchanged from previous note.  ALLERGIES:  is allergic to medroxyprogesterone and xeloda [capecitabine].  MEDICATIONS:  Current Outpatient Medications  Medication Sig Dispense Refill   ALPRAZolam (XANAX) 1 MG tablet Take 1 mg by mouth 4 (four) times daily as needed for anxiety.     baclofen (LIORESAL) 10 MG tablet TAKE 1 TABLET BY MOUTH TWICE A DAY AS NEEDED FOR MUSCLE SPASM 60 tablet 0   diphenhydrAMINE (BENADRYL) 25 mg capsule Take 25 mg by mouth every 6 (six) hours as needed for allergies.     diphenoxylate-atropine (LOMOTIL) 2.5-0.025 MG tablet Take 1-2 tablets by mouth 4 (four) times daily as needed for diarrhea or loose stools. 45 tablet 1   Emollient (EUCERIN) lotion Apply 1 application. topically as needed for dry skin (itchyness (red cap)).     gabapentin (NEURONTIN) 100 MG capsule TAKE 1 CAPSULE BY MOUTH 2 TIMES DAILY. OK TO INCREASE DOSE GRADUALLY TO 300MG TWICE DAILY 90 capsule 0   hydroxychloroquine (PLAQUENIL) 200 MG tablet Take 200 mg by mouth 2 (two) times daily.     ibuprofen (ADVIL) 200 MG tablet Take 800 mg by mouth every 8 (eight) hours as needed for moderate pain.     Ibuprofen-diphenhydrAMINE Cit (ADVIL PM PO) Take 1 capsule by mouth at bedtime as needed (sleep).     lidocaine-prilocaine (EMLA) cream Apply 1 application. topically as needed. 30 g 0   lidocaine-prilocaine (EMLA) cream Apply 1 Application topically as needed (port access). 30 g 1   pantoprazole (PROTONIX) 20 MG tablet TAKE 1 TABLET BY MOUTH EVERY DAY 90 tablet 1   prochlorperazine (COMPAZINE) 10 MG tablet Take 1 tablet (10 mg total) by mouth every 6 (six) hours as needed for nausea or vomiting. 30 tablet 1   sertraline (ZOLOFT) 50 MG tablet Take 50 mg by mouth every morning.     No current facility-administered medications for this visit.   Facility-Administered Medications  Ordered in Other Visits  Medication Dose Route Frequency Provider Last Rate Last Admin   atropine injection 0.5 mg  0.5 mg Intravenous Once PRN Truitt Merle, MD       dexamethasone (DECADRON) 10 mg in sodium chloride 0.9 % 50 mL IVPB  10 mg Intravenous Once Truitt Merle, MD       famotidine (PEPCID) IVPB 20 mg premix  20 mg Intravenous Once Truitt Merle, MD       fosaprepitant (EMEND) 150 mg in sodium chloride 0.9 % 145 mL IVPB  150 mg Intravenous Once Truitt Merle, MD 450 mL/hr at 03/07/22 0946 150 mg at 03/07/22 0946   magnesium sulfate  IVPB 2 g 50 mL  2 g Intravenous Once Truitt Merle, MD       sacituzumab govitecan-hziy (TRODELVY) 810 mg in sodium chloride 0.9 % 250 mL (2.4471 mg/mL) chemo infusion  810 mg Intravenous Once Truitt Merle, MD       sodium chloride flush (NS) 0.9 % injection 10 mL  10 mL Intravenous PRN Alla Feeling, NP   10 mL at 03/28/20 1104    PHYSICAL EXAMINATION: ECOG PERFORMANCE STATUS: 1 - Symptomatic but completely ambulatory  Vitals:   03/07/22 0838  BP: (!) 119/90  Pulse: 95  Resp: 17  Temp: 98.4 F (36.9 C)  SpO2: 100%   Wt Readings from Last 3 Encounters:  03/07/22 221 lb (100.2 kg)  02/20/22 223 lb (101.2 kg)  02/13/22 226 lb (102.5 kg)     GENERAL:alert, no distress and comfortable SKIN: skin color normal, no rashes or significant lesions EYES: normal, Conjunctiva are pink and non-injected, sclera clear  NEURO: alert & oriented x 3 with fluent speech Breast: Post left mastectomy, incision healed very well, no palpable nodule or abnormal skin changes.  LABORATORY DATA:  I have reviewed the data as listed    Latest Ref Rng & Units 03/07/2022    8:14 AM 02/20/2022    8:01 AM 02/13/2022    8:29 AM  CBC  WBC 4.0 - 10.5 K/uL 6.0  1.6  3.8   Hemoglobin 12.0 - 15.0 g/dL 11.9  12.0  11.2   Hematocrit 36.0 - 46.0 % 36.0  36.4  34.5   Platelets 150 - 400 K/uL 391  359  387         Latest Ref Rng & Units 03/07/2022    8:14 AM 02/20/2022    8:01 AM 02/13/2022    8:29  AM  CMP  Glucose 70 - 99 mg/dL 116  114  102   BUN 6 - 20 mg/dL _0 Creatinine 0.44 - 1.00 mg/dL 0.78  0.73  0.65   Sodium 135 - 145 mmol/L 140  139  138   Potassium 3.5 - 5.1 mmol/L 4.0  3.9  4.2   Chloride 98 - 111 mmol/L 106  104  106   CO2 22 - 32 mmol/L _1 Calcium 8.9 - 10.3 mg/dL 10.0  9.9  8.9   Total Protein 6.5 - 8.1 g/dL 7.4  7.6  7.1   Total Bilirubin 0.3 - 1.2 mg/dL 0.6  0.3  0.2   Alkaline Phos 38 - 126 U/L 159  153  141   AST 15 - 41 U/L 46  45  23   ALT 0 - 44 U/L 58  57  34       RADIOGRAPHIC STUDIES: I have personally reviewed the radiological images as listed and agreed with the findings in the report. No results found.    Orders Placed This Encounter  Procedures   NM PET Image Restag (PS) Skull Base To Thigh    Standing Status:   Future    Standing Expiration Date:   03/07/2023    Order Specific Question:   If indicated for the ordered procedure, I authorize the administration of a radiopharmaceutical per Radiology protocol    Answer:   Yes    Order Specific Question:   Is the patient pregnant?    Answer:   No    Order Specific Question:   Preferred imaging location?    Answer:  [facility]   CBC with Differential (Cancer Center Only)    Standing Status:   Future    Number of Occurrences:   1    Standing Expiration Date:   03/08/2023   CMP ([place] [facility] only)    Standing Status:   Future    Number of Occurrences:   1    Standing Expiration Date:   03/08/2023   Magnesium    Standing Status:   Future    Number of Occurrences:   1    Standing Expiration Date:   03/08/2023   Phosphorus    Standing Status:   Future    Number of Occurrences:   1    Standing Expiration Date:   03/08/2023   CBC with Differential (Cancer Center Only)    Standing Status:   Future    Standing Expiration Date:   03/29/2023   CMP ([facility] only)    Standing Status:   Future    Standing Expiration Date:   03/29/2023   Magnesium    Standing Status:    Future    Standing Expiration Date:   03/29/2023   Phosphorus    Standing Status:   Future    Standing Expiration Date:   03/29/2023   All questions were answered. The patient knows to call the clinic with any problems, questions or concerns. No barriers to learning was detected. The total time spent in the appointment was 30 minutes.     Truitt Merle, MD 03/07/2022   I, Wilburn Mylar, am acting as scribe for Truitt Merle, MD.   I have reviewed the above documentation for accuracy and completeness, and I agree with the above.

## 2022-03-07 NOTE — Patient Instructions (Signed)
[facility]  Discharge Instructions: Thank you for choosing [facility] to provide your oncology and hematology care.   If you have a lab appointment with the Cancer Center, please go directly to the Cancer Center and check in at the registration area.   Wear comfortable clothing and clothing appropriate for easy access to any Portacath or PICC line.   We strive to give you quality time with your provider. You may need to reschedule your appointment if you arrive late (15 or more minutes).  Arriving late affects you and other patients whose appointments are after yours.  Also, if you miss three or more appointments without notifying the office, you may be dismissed from the clinic at the provider's discretion.      For prescription refill requests, have your pharmacy contact our office and allow 72 hours for refills to be completed.    Today you received the following chemotherapy and/or immunotherapy agents: trodelvy      To help prevent nausea and vomiting after your treatment, we encourage you to take your nausea medication as directed.  BELOW ARE SYMPTOMS THAT SHOULD BE REPORTED IMMEDIATELY: *FEVER GREATER THAN 100.4 F (38 C) OR HIGHER *CHILLS OR SWEATING *NAUSEA AND VOMITING THAT IS NOT CONTROLLED WITH YOUR NAUSEA MEDICATION *UNUSUAL SHORTNESS OF BREATH *UNUSUAL BRUISING OR BLEEDING *URINARY PROBLEMS (pain or burning when urinating, or frequent urination) *BOWEL PROBLEMS (unusual diarrhea, constipation, pain near the anus) TENDERNESS IN MOUTH AND THROAT WITH OR WITHOUT PRESENCE OF ULCERS (sore throat, sores in mouth, or a toothache) UNUSUAL RASH, SWELLING OR PAIN  UNUSUAL VAGINAL DISCHARGE OR ITCHING   Items with * indicate a potential emergency and should be followed up as soon as possible or go to the Emergency Department if any problems should occur.  Please show the CHEMOTHERAPY ALERT CARD or IMMUNOTHERAPY ALERT CARD at check-in to  the Emergency Department and triage nurse.  Should you have questions after your visit or need to cancel or reschedule your appointment, please contact [facility]  Dept: [phone]  and follow the prompts.  Office hours are 8:00 a.m. to 4:30 p.m. Monday - Friday. Please note that voicemails left after 4:00 p.m. may not be returned until the following business day.  We are closed weekends and major holidays. You have access to a nurse at all times for urgent questions. Please call the main number to the clinic Dept: [phone] and follow the prompts.   For any non-urgent questions, you may also contact your provider using MyChart. We now offer e-Visits for anyone 18 and older to request care online for non-urgent symptoms. For details visit [email or web address].   Also download the MyChart app! Go to the app store, search "MyChart", open the app, select [facility], and log in with your MyChart username and password.  Masks are optional in the cancer centers. If you would like for your care team to wear a mask while they are taking care of you, please let them know. You may have one support person who is at least 39 years old accompany you for your appointments. 

## 2022-03-07 NOTE — Progress Notes (Signed)
[facility] Spiritual Care Note  Followed up with Vicente Males in infusion as planned. She has had significant social stressors recently and is planning to relocate to [place] to be close to family and friends. She reports good support from her psychiatrist through these plans. We talked in particular about the layers of grief she is experiencing through the recent social changes, on top of her prognosis.  Provided empathic listening, normalization of feelings, normalization of utilizing support, pastoral reflection, and affirmation of strengths. Tanielle has seen herself and others move through challenges in strong and healthy ways, which helps her feel empowered about her plans.  We plan to follow up at her treatment next week, and she has chaplain's direct number in case needs arise in the meantime.   [place], [place], [facility] Pager [phone] Voicemail [phone]

## 2022-03-08 ENCOUNTER — Encounter: Payer: Self-pay | Admitting: Hematology

## 2022-03-08 ENCOUNTER — Telehealth: Payer: Self-pay | Admitting: Hematology

## 2022-03-08 ENCOUNTER — Other Ambulatory Visit: Payer: Medicaid Other

## 2022-03-08 ENCOUNTER — Other Ambulatory Visit: Payer: Self-pay

## 2022-03-08 DIAGNOSIS — C50112 Malignant neoplasm of central portion of left female breast: Secondary | ICD-10-CM

## 2022-03-08 NOTE — Telephone Encounter (Signed)
Left message with follow-up appointments per 8/31 los. 

## 2022-03-08 NOTE — Progress Notes (Signed)
Per Dr. [name] sent referral to Dr. Neita Carp @ [facility]) in [place] [phone]  [phone]).  Sent pt demographics, pathology report, Dr. Ernestina Penna last office note, and referral order.  Epic fax confirmation received.  Dr. [name] ordered a PET Scan on the pt. Based on staff message from [place], PET is pending approval from [employer].

## 2022-03-09 ENCOUNTER — Other Ambulatory Visit: Payer: Self-pay

## 2022-03-12 ENCOUNTER — Other Ambulatory Visit: Payer: Self-pay

## 2022-03-12 NOTE — Progress Notes (Signed)
This nurse received a message from this patient  with information to send new patient referral to [facility] in [place] with attention to Dr. Neita Carp.  Fax # [phone].  This nurse left a message for patient to let her know the information was received and referral has been faxed today.  No further concerns at this time.

## 2022-03-13 ENCOUNTER — Inpatient Hospital Stay: Payer: Medicaid Other

## 2022-03-13 ENCOUNTER — Inpatient Hospital Stay: Payer: Medicaid Other | Admitting: Hematology

## 2022-03-13 ENCOUNTER — Inpatient Hospital Stay: Payer: Medicaid Other | Attending: Hematology

## 2022-03-13 ENCOUNTER — Other Ambulatory Visit: Payer: Self-pay

## 2022-03-13 DIAGNOSIS — Z5111 Encounter for antineoplastic chemotherapy: Secondary | ICD-10-CM | POA: Insufficient documentation

## 2022-03-13 DIAGNOSIS — Z95828 Presence of other vascular implants and grafts: Secondary | ICD-10-CM

## 2022-03-13 DIAGNOSIS — C50112 Malignant neoplasm of central portion of left female breast: Secondary | ICD-10-CM | POA: Diagnosis not present

## 2022-03-13 LAB — CBC WITH DIFFERENTIAL/PLATELET
Abs Immature Granulocytes: 0.03 10*3/uL (ref 0.00–0.07)
Basophils Absolute: 0 10*3/uL (ref 0.0–0.1)
Basophils Relative: 1 %
Eosinophils Absolute: 0 10*3/uL (ref 0.0–0.5)
Eosinophils Relative: 0 %
HCT: 32.1 % — ABNORMAL LOW (ref 36.0–46.0)
Hemoglobin: 10.5 g/dL — ABNORMAL LOW (ref 12.0–15.0)
Immature Granulocytes: 1 %
Lymphocytes Relative: 15 %
Lymphs Abs: 0.5 10*3/uL — ABNORMAL LOW (ref 0.7–4.0)
MCH: 29 pg (ref 26.0–34.0)
MCHC: 32.7 g/dL (ref 30.0–36.0)
MCV: 88.7 fL (ref 80.0–100.0)
Monocytes Absolute: 0.1 10*3/uL (ref 0.1–1.0)
Monocytes Relative: 3 %
Neutro Abs: 2.6 10*3/uL (ref 1.7–7.7)
Neutrophils Relative %: 80 %
Platelets: 289 10*3/uL (ref 150–400)
RBC: 3.62 MIL/uL — ABNORMAL LOW (ref 3.87–5.11)
RDW: 16.2 % — ABNORMAL HIGH (ref 11.5–15.5)
Smear Review: NORMAL
WBC: 3.3 10*3/uL — ABNORMAL LOW (ref 4.0–10.5)
nRBC: 0 % (ref 0.0–0.2)

## 2022-03-13 LAB — COMPREHENSIVE METABOLIC PANEL
ALT: 56 U/L — ABNORMAL HIGH (ref 0–44)
AST: 38 U/L (ref 15–41)
Albumin: 4.1 g/dL (ref 3.5–5.0)
Alkaline Phosphatase: 160 U/L — ABNORMAL HIGH (ref 38–126)
Anion gap: 6 (ref 5–15)
BUN: 12 mg/dL (ref 6–20)
CO2: 29 mmol/L (ref 22–32)
Calcium: 9.7 mg/dL (ref 8.9–10.3)
Chloride: 103 mmol/L (ref 98–111)
Creatinine, Ser: 0.67 mg/dL (ref 0.44–1.00)
GFR, Estimated: >60 mL/min (ref >60)
Glucose, Bld: 103 mg/dL — ABNORMAL HIGH (ref 70–99)
Potassium: 3.7 mmol/L (ref 3.5–5.1)
Sodium: 138 mmol/L (ref 135–145)
Total Bilirubin: 0.7 mg/dL (ref 0.3–1.2)
Total Protein: 7 g/dL (ref 6.5–8.1)

## 2022-03-13 MED ORDER — HEPARIN SOD (PORK) LOCK FLUSH 100 UNIT/ML IV SOLN: 500.0000 [IU] | Freq: Once | INTRAVENOUS | Status: DC

## 2022-03-13 MED ORDER — SODIUM CHLORIDE 0.9% FLUSH
10.0000 mL | Freq: Once | INTRAVENOUS | Status: AC
  Administered 2022-03-13: 10 mL

## 2022-03-13 MED FILL — Fosaprepitant Dimeglumine For IV Infusion 150 MG (Base Eq): INTRAVENOUS | Qty: 5 | Status: AC

## 2022-03-13 MED FILL — Dexamethasone Sodium Phosphate Inj 100 MG/10ML: INTRAMUSCULAR | Qty: 1 | Status: AC

## 2022-03-14 ENCOUNTER — Telehealth: Payer: Self-pay | Admitting: Hematology

## 2022-03-14 ENCOUNTER — Encounter: Payer: Self-pay | Admitting: General Practice

## 2022-03-14 ENCOUNTER — Inpatient Hospital Stay: Payer: Medicaid Other

## 2022-03-14 ENCOUNTER — Other Ambulatory Visit: Payer: Self-pay | Admitting: Hematology

## 2022-03-14 VITALS — BP 135/93 | HR 81 | Temp 98.1°F | Resp 17 | Wt 220.8 lb

## 2022-03-14 DIAGNOSIS — C50112 Malignant neoplasm of central portion of left female breast: Secondary | ICD-10-CM

## 2022-03-14 DIAGNOSIS — Z5111 Encounter for antineoplastic chemotherapy: Secondary | ICD-10-CM | POA: Diagnosis not present

## 2022-03-14 MED ORDER — HEPARIN SOD (PORK) LOCK FLUSH 100 UNIT/ML IV SOLN
500.0000 [IU] | Freq: Once | INTRAVENOUS | Status: AC | PRN
  Administered 2022-03-14: 500 [IU]

## 2022-03-14 MED ORDER — SODIUM CHLORIDE 0.9 % IV SOLN: 810.0000 mg | Freq: Once | INTRAVENOUS | Status: DC

## 2022-03-14 MED ORDER — SODIUM CHLORIDE 0.9 % IV SOLN
6.0000 mg/kg | Freq: Once | INTRAVENOUS | Status: AC
  Administered 2022-03-14: 610 mg via INTRAVENOUS
  Filled 2022-03-14: qty 61

## 2022-03-14 MED ORDER — ATROPINE SULFATE 1 MG/ML IV SOLN: 0.5000 mg | Freq: Once | INTRAVENOUS | Status: DC | PRN

## 2022-03-14 MED ORDER — FAMOTIDINE IN NACL 20-0.9 MG/50ML-% IV SOLN
20.0000 mg | Freq: Once | INTRAVENOUS | Status: AC
  Administered 2022-03-14: 20 mg via INTRAVENOUS
  Filled 2022-03-14: qty 50

## 2022-03-14 MED ORDER — MAGNESIUM OXIDE -MG SUPPLEMENT 400 (240 MG) MG PO TABS: 400.0000 mg | ORAL_TABLET | Freq: Every day | ORAL | 1 refills | Status: DC

## 2022-03-14 MED ORDER — DIPHENHYDRAMINE HCL 50 MG/ML IJ SOLN
25.0000 mg | Freq: Once | INTRAMUSCULAR | Status: AC
  Administered 2022-03-14: 25 mg via INTRAVENOUS
  Filled 2022-03-14: qty 1

## 2022-03-14 MED ORDER — ACETAMINOPHEN 325 MG PO TABS
650.0000 mg | ORAL_TABLET | Freq: Once | ORAL | Status: AC
  Administered 2022-03-14: 650 mg via ORAL
  Filled 2022-03-14: qty 2

## 2022-03-14 MED ORDER — SODIUM CHLORIDE 0.9 % IV SOLN
150.0000 mg | Freq: Once | INTRAVENOUS | Status: AC
  Administered 2022-03-14: 150 mg via INTRAVENOUS
  Filled 2022-03-14: qty 150

## 2022-03-14 MED ORDER — SODIUM CHLORIDE 0.9 % IV SOLN
10.0000 mg | Freq: Once | INTRAVENOUS | Status: AC
  Administered 2022-03-14: 10 mg via INTRAVENOUS
  Filled 2022-03-14: qty 10

## 2022-03-14 MED ORDER — SODIUM CHLORIDE 0.9% FLUSH
10.0000 mL | INTRAVENOUS | Status: DC | PRN
  Administered 2022-03-14: 10 mL

## 2022-03-14 MED ORDER — SODIUM CHLORIDE 0.9 % IV SOLN: Freq: Once | INTRAVENOUS | Status: AC

## 2022-03-14 MED ORDER — PALONOSETRON HCL INJECTION 0.25 MG/5ML
0.2500 mg | Freq: Once | INTRAVENOUS | Status: AC
  Administered 2022-03-14: 0.25 mg via INTRAVENOUS
  Filled 2022-03-14: qty 5

## 2022-03-14 NOTE — Progress Notes (Signed)
Per Dr. [name], she'd like pt to get 6 mg/kg on Day 8 each cycle (per her OV note 03/07/22). Tx plan updated.  Kennith [place], Pharm.D., CPP 03/14/2022'@9'$ :27 AM

## 2022-03-14 NOTE — Telephone Encounter (Signed)
Scheduled follow-up appointment per 9/7 staff message. Patient is aware.

## 2022-03-14 NOTE — Patient Instructions (Signed)
[facility] ONCOLOG[facility]  Discharge Instructions: Thank you for choosing [facility] to provide your oncology and hematology care.   If you have a lab appointment with the [facility], please go directly to the [facility] and check in at the registration area.   Wear comfortable clothing and clothing appropriate for easy access to any Portacath or PICC line.   We strive to give you quality time with your provider. You may need to reschedule your appointment if you arrive late (15 or more minutes).  Arriving late affects you and other patients whose appointments are after yours.  Also, if you miss three or more appointments without notifying the office, you may be dismissed from the clinic at the provider's discretion.      For prescription refill requests, have your pharmacy contact our office and allow 72 hours for refills to be completed.    Today you received the following chemotherapy and/or immunotherapy agents: sacituzumab govitecan [name])      To help prevent nausea and vomiting after your treatment, we encourage you to take your nausea medication as directed.  BELOW ARE SYMPTOMS THAT SHOULD BE REPORTED IMMEDIATELY: *FEVER GREATER THAN 100.4 F (38 C) OR HIGHER *CHILLS OR SWEATING *NAUSEA AND VOMITING THAT IS NOT CONTROLLED WITH YOUR NAUSEA MEDICATION *UNUSUAL SHORTNESS OF BREATH *UNUSUAL BRUISING OR BLEEDING *URINARY PROBLEMS (pain or burning when urinating, or frequent urination) *BOWEL PROBLEMS (unusual diarrhea, constipation, pain near the anus) TENDERNESS IN MOUTH AND THROAT WITH OR WITHOUT PRESENCE OF ULCERS (sore throat, sores in mouth, or a toothache) UNUSUAL RASH, SWELLING OR PAIN  UNUSUAL VAGINAL DISCHARGE OR ITCHING   Items with * indicate a potential emergency and should be followed up as soon as possible or go to the Emergency Department if any problems should occur.  Please show the CHEMOTHERAPY ALERT CARD or IMMUNOTHERAPY  ALERT CARD at check-in to the Emergency Department and triage nurse.  Should you have questions after your visit or need to cancel or reschedule your appointment, please contact [facility]  Dept: [phone]  and follow the prompts.  Office hours are 8:00 a.m. to 4:30 p.m. Monday - Friday. Please note that voicemails left after 4:00 p.m. may not be returned until the following business day.  We are closed weekends and major holidays. You have access to a nurse at all times for urgent questions. Please call the main number to the clinic Dept: [phone] and follow the prompts.   For any non-urgent questions, you may also contact your provider using MyChart. We now offer e-Visits for anyone 55 and older to request care online for non-urgent symptoms. For details visit [email or web address].   Also download the MyChart app! Go to the app store, search "MyChart", open the app, select [facility], and log in with your MyChart username and password.  Masks are optional in the cancer centers. If you would like for your care team to wear a mask while they are taking care of you, please let them know. You may have one support person who is at least 39 years old accompany you for your appointments.

## 2022-03-14 NOTE — Progress Notes (Signed)
[facility] Spiritual Care Note  Followed up with Vicente Males in infusion to provide opportunity for her to share and process life changes and plans since our last encounter. She is working hard to prepare for her move back to [place], [place], where her dad and many close friends live, while also grieving the loss of relationship here.  Provided pastoral presence, empathic listening, emotional support, and affirmation of strengths (including attitude, perspective, and social support already in place in [facility] [place]).   We hope to follow up in infusion, as schedules allow, and Chelsye knows to reach out if she would like to process scan results once she learns them. Chaplain is available to accompany her to appointment or to debrief afterward, as desired.   [place], [place], [facility] Pager [phone] Voicemail [phone]

## 2022-03-15 ENCOUNTER — Other Ambulatory Visit: Payer: Self-pay

## 2022-03-15 ENCOUNTER — Inpatient Hospital Stay: Payer: Medicaid Other

## 2022-03-15 ENCOUNTER — Telehealth: Payer: Self-pay

## 2022-03-15 NOTE — Telephone Encounter (Signed)
LVM informing pt that she had an appt today for her Udenyca injection.  Instructed pt that this RN have scheduled her  on Saturday 03/16/2022 at 1300 for her Udenyca Injection.  Informed pt that Dr. Ernestina Penna office closes at 1630 today but to please call before this time to indicate whether she can attend this appt.

## 2022-03-16 ENCOUNTER — Inpatient Hospital Stay: Payer: Medicaid Other

## 2022-03-16 NOTE — Progress Notes (Signed)
Called patient at 1340, she missed her 1300 appt for an injection. She stated she didn't know about the appt and I informed her there was a documented VM yesterday but she stated she didn't see anything new come across my chart. She cannot come today she said and requested to come Monday at 1300. I told her I was not sure that would be ok with the treatment plan and will have her MD/desk RN call her on 9/11. She agreed.

## 2022-03-18 ENCOUNTER — Other Ambulatory Visit: Payer: Self-pay

## 2022-03-18 ENCOUNTER — Other Ambulatory Visit (HOSPITAL_BASED_OUTPATIENT_CLINIC_OR_DEPARTMENT_OTHER): Payer: Self-pay

## 2022-03-18 DIAGNOSIS — C50112 Malignant neoplasm of central portion of left female breast: Secondary | ICD-10-CM

## 2022-03-25 ENCOUNTER — Encounter (HOSPITAL_COMMUNITY): Payer: Medicaid Other

## 2022-03-25 ENCOUNTER — Inpatient Hospital Stay: Payer: Medicaid Other

## 2022-03-26 ENCOUNTER — Other Ambulatory Visit: Payer: Medicaid Other

## 2022-03-26 ENCOUNTER — Other Ambulatory Visit: Payer: Self-pay | Admitting: Hematology

## 2022-03-26 ENCOUNTER — Ambulatory Visit (HOSPITAL_COMMUNITY)
Admission: RE | Admit: 2022-03-26 | Discharge: 2022-03-26 | Disposition: A | Discharge status: Home / Self Care | Payer: Medicaid Other | Source: Ambulatory Visit | Attending: Hematology | Admitting: Hematology

## 2022-03-26 DIAGNOSIS — Z171 Estrogen receptor negative status [ER-]: Secondary | ICD-10-CM | POA: Insufficient documentation

## 2022-03-26 DIAGNOSIS — C50112 Malignant neoplasm of central portion of left female breast: Secondary | ICD-10-CM | POA: Insufficient documentation

## 2022-03-26 MED ORDER — IOHEXOL 300 MG/ML  SOLN
100.0000 mL | Freq: Once | INTRAMUSCULAR | Status: AC | PRN
  Administered 2022-03-26: 100 mL via INTRAVENOUS

## 2022-03-26 MED ORDER — HEPARIN SOD (PORK) LOCK FLUSH 100 UNIT/ML IV SOLN
INTRAVENOUS | Status: AC
  Administered 2022-03-26: 500 [IU] via INTRAVENOUS
  Filled 2022-03-26: qty 5

## 2022-03-26 MED ORDER — HEPARIN SOD (PORK) LOCK FLUSH 100 UNIT/ML IV SOLN: 500.0000 [IU] | Freq: Once | INTRAVENOUS | Status: AC

## 2022-03-27 MED FILL — Dexamethasone Sodium Phosphate Inj 100 MG/10ML: INTRAMUSCULAR | Qty: 1 | Status: AC

## 2022-03-27 MED FILL — Fosaprepitant Dimeglumine For IV Infusion 150 MG (Base Eq): INTRAVENOUS | Qty: 5 | Status: AC

## 2022-03-28 ENCOUNTER — Other Ambulatory Visit: Payer: Self-pay

## 2022-03-28 ENCOUNTER — Inpatient Hospital Stay: Payer: Medicaid Other

## 2022-03-28 ENCOUNTER — Inpatient Hospital Stay (HOSPITAL_BASED_OUTPATIENT_CLINIC_OR_DEPARTMENT_OTHER): Payer: Medicaid Other | Admitting: Hematology

## 2022-03-28 ENCOUNTER — Encounter: Payer: Self-pay | Admitting: General Practice

## 2022-03-28 ENCOUNTER — Other Ambulatory Visit: Payer: Medicaid Other

## 2022-03-28 VITALS — BP 121/87 | HR 86 | Temp 98.1°F | Resp 16 | Wt 220.7 lb

## 2022-03-28 DIAGNOSIS — C50112 Malignant neoplasm of central portion of left female breast: Secondary | ICD-10-CM

## 2022-03-28 DIAGNOSIS — Z95828 Presence of other vascular implants and grafts: Secondary | ICD-10-CM

## 2022-03-28 DIAGNOSIS — Z171 Estrogen receptor negative status [ER-]: Secondary | ICD-10-CM

## 2022-03-28 DIAGNOSIS — Z5111 Encounter for antineoplastic chemotherapy: Secondary | ICD-10-CM | POA: Diagnosis not present

## 2022-03-28 LAB — CMP (CANCER CENTER ONLY)
ALT: 53 U/L — ABNORMAL HIGH (ref 0–44)
AST: 40 U/L (ref 15–41)
Albumin: 3.9 g/dL (ref 3.5–5.0)
Alkaline Phosphatase: 164 U/L — ABNORMAL HIGH (ref 38–126)
Anion gap: 7 (ref 5–15)
BUN: 14 mg/dL (ref 6–20)
CO2: 27 mmol/L (ref 22–32)
Calcium: 8.9 mg/dL (ref 8.9–10.3)
Chloride: 106 mmol/L (ref 98–111)
Creatinine: 0.73 mg/dL (ref 0.44–1.00)
GFR, Estimated: >60 mL/min (ref >60)
Glucose, Bld: 131 mg/dL — ABNORMAL HIGH (ref 70–99)
Potassium: 3.6 mmol/L (ref 3.5–5.1)
Sodium: 140 mmol/L (ref 135–145)
Total Bilirubin: 0.4 mg/dL (ref 0.3–1.2)
Total Protein: 6.6 g/dL (ref 6.5–8.1)

## 2022-03-28 LAB — MAGNESIUM: Magnesium: 1.5 mg/dL — ABNORMAL LOW (ref 1.7–2.4)

## 2022-03-28 LAB — CBC WITH DIFFERENTIAL (CANCER CENTER ONLY)
Abs Immature Granulocytes: 0.01 10*3/uL (ref 0.00–0.07)
Basophils Absolute: 0 10*3/uL (ref 0.0–0.1)
Basophils Relative: 0 %
Eosinophils Absolute: 0 10*3/uL (ref 0.0–0.5)
Eosinophils Relative: 1 %
HCT: 34.3 % — ABNORMAL LOW (ref 36.0–46.0)
Hemoglobin: 11.3 g/dL — ABNORMAL LOW (ref 12.0–15.0)
Immature Granulocytes: 0 %
Lymphocytes Relative: 24 %
Lymphs Abs: 0.7 10*3/uL (ref 0.7–4.0)
MCH: 29.8 pg (ref 26.0–34.0)
MCHC: 32.9 g/dL (ref 30.0–36.0)
MCV: 90.5 fL (ref 80.0–100.0)
Monocytes Absolute: 0.4 10*3/uL (ref 0.1–1.0)
Monocytes Relative: 13 %
Neutro Abs: 1.7 10*3/uL (ref 1.7–7.7)
Neutrophils Relative %: 62 %
Platelet Count: 360 10*3/uL (ref 150–400)
RBC: 3.79 MIL/uL — ABNORMAL LOW (ref 3.87–5.11)
RDW: 19.1 % — ABNORMAL HIGH (ref 11.5–15.5)
WBC Count: 2.8 10*3/uL — ABNORMAL LOW (ref 4.0–10.5)
nRBC: 0 % (ref 0.0–0.2)

## 2022-03-28 LAB — PHOSPHORUS: Phosphorus: 3.1 mg/dL (ref 2.5–4.6)

## 2022-03-28 MED ORDER — SODIUM CHLORIDE 0.9 % IV SOLN
10.0000 mg | Freq: Once | INTRAVENOUS | Status: AC
  Administered 2022-03-28: 10 mg via INTRAVENOUS
  Filled 2022-03-28: qty 10

## 2022-03-28 MED ORDER — MAGNESIUM SULFATE 2 GM/50ML IV SOLN
2.0000 g | Freq: Once | INTRAVENOUS | Status: AC
  Administered 2022-03-28: 2 g via INTRAVENOUS
  Filled 2022-03-28: qty 50

## 2022-03-28 MED ORDER — GABAPENTIN 300 MG PO CAPS: 300.0000 mg | ORAL_CAPSULE | Freq: Three times a day (TID) | ORAL | 0 refills | Status: DC

## 2022-03-28 MED ORDER — HEPARIN SOD (PORK) LOCK FLUSH 100 UNIT/ML IV SOLN
500.0000 [IU] | Freq: Once | INTRAVENOUS | Status: AC | PRN
  Administered 2022-03-28: 500 [IU]

## 2022-03-28 MED ORDER — SODIUM CHLORIDE 0.9 % IV SOLN: Freq: Once | INTRAVENOUS | Status: AC

## 2022-03-28 MED ORDER — ACETAMINOPHEN 325 MG PO TABS
650.0000 mg | ORAL_TABLET | Freq: Once | ORAL | Status: AC
  Administered 2022-03-28: 650 mg via ORAL
  Filled 2022-03-28: qty 2

## 2022-03-28 MED ORDER — FAMOTIDINE IN NACL 20-0.9 MG/50ML-% IV SOLN
20.0000 mg | Freq: Once | INTRAVENOUS | Status: AC
  Administered 2022-03-28: 20 mg via INTRAVENOUS
  Filled 2022-03-28: qty 50

## 2022-03-28 MED ORDER — SODIUM CHLORIDE 0.9% FLUSH
10.0000 mL | INTRAVENOUS | Status: DC | PRN
  Administered 2022-03-28: 10 mL

## 2022-03-28 MED ORDER — ATROPINE SULFATE 1 MG/ML IV SOLN
0.5000 mg | Freq: Once | INTRAVENOUS | Status: AC | PRN
  Administered 2022-03-28: 0.5 mg via INTRAVENOUS
  Filled 2022-03-28: qty 1

## 2022-03-28 MED ORDER — LIDOCAINE-PRILOCAINE 2.5-2.5 % EX CREA: 1.0000 | TOPICAL_CREAM | CUTANEOUS | 0 refills | Status: DC | PRN

## 2022-03-28 MED ORDER — SODIUM CHLORIDE 0.9% FLUSH
10.0000 mL | Freq: Once | INTRAVENOUS | Status: AC
  Administered 2022-03-28: 10 mL

## 2022-03-28 MED ORDER — SODIUM CHLORIDE 0.9 % IV SOLN
150.0000 mg | Freq: Once | INTRAVENOUS | Status: AC
  Administered 2022-03-28: 150 mg via INTRAVENOUS
  Filled 2022-03-28: qty 150

## 2022-03-28 MED ORDER — SODIUM CHLORIDE 0.9 % IV SOLN
810.0000 mg | Freq: Once | INTRAVENOUS | Status: AC
  Administered 2022-03-28: 810 mg via INTRAVENOUS
  Filled 2022-03-28: qty 81

## 2022-03-28 MED ORDER — DIPHENHYDRAMINE HCL 50 MG/ML IJ SOLN
25.0000 mg | Freq: Once | INTRAMUSCULAR | Status: AC
  Administered 2022-03-28: 25 mg via INTRAVENOUS
  Filled 2022-03-28: qty 1

## 2022-03-28 MED ORDER — PALONOSETRON HCL INJECTION 0.25 MG/5ML
0.2500 mg | Freq: Once | INTRAVENOUS | Status: AC
  Administered 2022-03-28: 0.25 mg via INTRAVENOUS
  Filled 2022-03-28: qty 5

## 2022-03-28 NOTE — Progress Notes (Signed)
[facility]   [phone] [phone]   Clinic Follow up Note   Patient Care Team: [name], MD as PCP - General (Family Medicine) Morrison Old, NP as PCP - Dermatology (Nurse Practitioner) Mauro Kaufmann, RN as Oncology Nurse Navigator Rockwell Germany, RN as Oncology Nurse Navigator [name], MD as Consulting Physician (General Surgery) Truitt Merle, MD as Consulting Physician (Hematology) Gery Pray, MD as Consulting Physician ([facility]) Debby Bud, [place] (Physician Assistant) Jimmy Footman, DO as Consulting Physician (Hematology and Oncology)  Date of Service:  03/28/2022  CHIEF COMPLAINT: f/u of recurrent left breast cancer  CURRENT THERAPY:  Caitlyn Hendricks, started 09/17/21  ASSESSMENT & PLAN:  Caitlyn Hendricks is a 41 y.o. pre-menopausal female with   1. 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 Biopsy on 03/08/20 revealed invasive ductal carcinoma, triple negative, metastatic to her left axillary LN. CT CAP/Bone scan negative for distant mets.  -She completed neoadjuvant chemo with ddAC q2weeks for 4 cycles 03/22/20-05/03/20. Followed by weekly carbo/taxol for 12 weeks 05/17/20-08/16/20.  -she started Ballard Russell for 1 year treatment on 04/28/20, but stopped in 10/2020 due to skin rash and arthralgia.   -s/p left mastectomy with Dr Donne Hazel on 09/20/20, path showed No residual invasive carcinoma in breast s/p neoadjuvant treatment, but 7/19 positive LN.  -she began concurrent chemoRT with oral Xeloda on 10/30/20 and completed radiation on 12/11/20. She developed a rash from Xeloda, and this was held from 5/16-6/27/22. When this was restarted, she again developed a rash and joint pain. It was switched to 5-FU on 01/18/21  for 4 cycles. During that time, she was diagnosed with Lupus and put on medication. With the rash under control, she switched back to Xeloda and completed 6 months of treatment in 05/2021.  --she developed skin erythema with nodularity to left chest wall. Punch biopsy of the area on 08/28/21 revealed infiltrating carcinoma, consistent with breast origin. This was found to be Her2-. -PET scan on 09/13/21 showed: hypermetabolic left level IV, left supraclavicular, and right axillary adenopathy. -she started [employer] Caitlyn Hendricks) on 09/17/21. Aside from hospitalization for C.diff on 09/28/21, she has tolerated well overall, and her left chest discomfort and skin lesion have improved. -we changed her treatment back to two weeks one, one week off, with reduced dose on day 1 and more on day 8. She is tolerating better. -restaging CT CAP 03/26/22 showed NED. I reviewed the images and discussed the results with her today. She is quite pleased  -labs reviewed, overall stable-- WBC 2.8, hgb 11.3. We will proceed to treatment today. -she is planning to move to [facility] on 04/06/22. We will plan to give her injection on day 9.   2. Cutaneous Lupus -She had several episodes of rash since she started chemo treatment; skin biopsy showed a drug-related dermatitis. The rash persisted on Keytruda, Xeloda, and 5-FU. -lab work with her dermatologist on 02/27/21 revealed cutaneous Lupus.  -well managed with plaquenil     PLAN: -proceed with C9 Trodelvy today and in 1 week at the same reduced doses, Udenyca on day 9 -I refilled gabapentin and emla cream -she is already scheduled with oncology in [place] -f/u open -will give iv mag 2g today and incrase oral mag to twice daily    No problem-specific Assessment &  Plan notes found for this encounter.   SUMMARY OF ONCOLOGIC HISTORY: Oncology History Overview Note  Cancer Staging Cancer of central portion of left female breast [facility]) Staging form: Breast,  AJCC 8th Edition - Clinical stage from 03/08/2020: Stage IIIB (cT2, cN1, cM0, G3, ER-, PR-, HER2-) - Signed by Truitt Merle, MD on 03/10/2020 Stage prefix: Initial diagnosis Histologic grading system: 3 grade system - Pathologic stage from 09/20/2020: No Stage Recommended (ypT0, pN2a, cM0, G3, ER-, PR-, HER2-) - Signed by Gardenia Phlegm, NP on 10/04/2020 Stage prefix: Post-therapy Histologic grading system: 3 grade system    Cancer of central portion of left female breast ([place])  02/23/2020 Mammogram   Diagnostic Mammogram 02/23/20  IMPRESSION The 2x1x2.6cm irregular mass in teh left breast at 12:00 posiiton middle depth is highly suspicious of malignancy. An [place] is recommended for further evaluation and biopsy planning purposes.   02/23/2020 Breast US   [name] 02/23/20  IMPRESSION 2 adjacent spiculated masses in the left brast at 12:00 position 3 cm from the nipple (2.1x0.9x1.1cm and 1.1x1.4x0.5cm) is suggestive of malignancy.   Multiple abnormal left subpectoral and left axillary nodes measuring 3.3x2.1 cm concerning for metastatic adenopathy.    Left breast skin thickening and edema may be secondary congestive edema due to extensive axillary adenopathy.    03/08/2020 Initial Biopsy   Diagnosis 1.Breast, left, needle core biopsy, 12:00 position, 3cmfn -INVASIVE DUCTAL CARCINOMA -SEE COMMENT  2. Lymph node, needle/core biopsy, left axilla -METASTATIC CARCINOMA INVOLVING A LYMPH NODE  -LYMPHOVASCULAR SPACE INVASION PRESENT   Microscopic Comment  1.Based on the biopsy the carcinoma appears Nottingham Grade 3 or 3 and measures 1 cm in the greatest linear extent.    03/08/2020 Receptors her2   ER- Negative 0% PR - Negative 0% HER2 - Negative  KI 67 - 80%    03/08/2020 Cancer Staging   Staging form: Breast, AJCC 8th Edition - Clinical stage from 03/08/2020: Stage IIIB (cT2, cN1, cM0, G3, ER-, PR-, HER2-) - Signed by Truitt Merle, MD on 03/10/2020   03/10/2020 Initial Diagnosis    Cancer of central portion of left female breast ([place])   03/16/2020 Breast MRI   IMPRESSION: 1. 8.1 x 7.8 x 6.6 cm biopsy proven invasive ductal carcinoma in the central right breast, involving 3 quadrants. 2. 3.0 x 1.7 x 1.1 cm satellite mass more inferiorly in lower inner quadrant of the left breast, compatible with additional malignancy. 3. Metastatic level 1 and level 2 left axillary lymph nodes. 4. No evidence of malignancy on the right.   03/17/2020 Imaging   IMPRESSION: CT CAP w contrast  1. Diffuse skin thickening in the left breast with left axillary and subpectoral lymphadenopathy, as well as a mildly enlarged left supraclavicular lymph node, which likely represents metastatic lymphadenopathy. No other definite extra nodal metastatic disease noted elsewhere in the chest, abdomen or pelvis. 2. Large mass in the central anatomic pelvis which is of uncertain origin, potentially a large exophytic fibroid or a large solid mass arising from the right ovary. Further evaluation with pelvic ultrasound is strongly recommended.   03/21/2020 Imaging   Bone Scan  IMPRESSION: Apparent arthropathy at L5. No bony metastatic disease is demonstrable on this study. Scattered foci of abnormal uptake in a pelvic mass is of uncertain etiology given absence of calcification in this mass by CT. This mass compresses the urinary bladder. It is possible that some of the increased uptake in this area actually represents physiologic uptake within the bladder.  Kidneys noted in flank positions bilaterally.     03/22/2020 - 08/16/2020 Neo-Adjuvant Chemotherapy   Neoadjuvant Adriamycin and Cytoxan q2weeks for 4 cycles starting 03/22/20-05/03/20 followed by weekly Taxol and Carboplatin for 12 weeks starting 05/17/20-08/16/20   03/24/2020 Imaging   US Pelvis  IMPRESSION: 1. Large pedunculated lesion directly contiguous with the uterine most suggestive of a large subserosal fibroid which measures up to 8.2  cm. 2. Additional 1 cm probable intramural fibroid in the right anterior uterine body. 3. No other acute or worrisome pelvic abnormality.   03/29/2020 Genetic Testing   Negative genetic testing on the common hereditary cancer panel.  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  The Common Hereditary Gene Panel offered by [facility] includes sequencing and/or deletion duplication testing of the following 47 genes: APC, ATM, AXIN2, BARD1, BMPR1A, BRCA1, BRCA2, BRIP1, CDH1, CDK4, CDKN2A (p14ARF), CDKN2A (p16INK4a), CHEK2, CTNNA1, DICER1, EPCAM (Deletion/duplication testing only), GREM1 (promoter region deletion/duplication testing only), KIT, MEN1, MLH1, MSH2, MSH3, MSH6, MUTYH, NBN, NF1, NHTL1, PALB2, PDGFRA, PMS2, POLD1, POLE, PTEN, RAD50, RAD51C, RAD51D, SDHB, SDHC, SDHD, SMAD4, SMARCA4. STK11, TP53, TSC1, TSC2, and VHL.  The following genes were evaluated for sequence changes only: SDHA and HOXB13 c.251G>A variant only. The report date is 03/29/2020   04/28/2020 - 10/27/2020 Antibody Plan   Added Keytruda q3weeks starting 04/28/20 to complete 1 year of treatment. Held since 10/27/20 due to skin rash and body aches.    08/17/2020 Breast MRI   IMPRESSION: 1. The biopsy proven malignancy in the left breast and the adjacent satellite lesion have resolved in the interval. No abnormalities in these locations today. 2. No MRI evidence of malignancy in the right breast. 3. No adenopathy identified today   09/20/2020 Surgery   LEFT MODIFIED RADICAL MASTECTOMY by Dr Donne Hazel   09/20/2020 Pathology Results   FINAL MICROSCOPIC DIAGNOSIS:   A. BREAST, LEFT, MODIFIED RADICAL MASTECTOMY:  - No residual invasive carcinoma in breast status post neoadjuvant  treatment.  - Metastatic carcinoma in (2) of (14) lymph nodes.  - Biopsy sites (one in breast, one in one of the positive lymph nodes).  - See oncology table.   B. ADDITIONAL AXILLARY CONTENTS, LEFT, DISSECTION:  - Metastatic carcinoma in (5) of (5) lymph nodes.     09/20/2020 Cancer Staging   Staging form: Breast, AJCC 8th Edition - Pathologic stage from 09/20/2020: No Stage Recommended (ypT0, pN2a, cM0, G3, ER-, PR-, HER2-) - Signed by Gardenia Phlegm, NP on 10/04/2020 Stage prefix: Post-therapy Histologic grading system: 3 grade system   10/30/2020 - 12/11/2020 Radiation Therapy   Adjuvant Radiation with Dr Sondra Come starting 10/30/20   10/30/2020 -  Chemotherapy   Adjuvant Xeloda starting 10/30/20 at 155m BID M-F while on RT. Held since 11/20/2020 due to recurrent skin rash   01/18/2021 - 03/17/2021 Chemotherapy   Patient is on Treatment Plan : COLORECTAL 5FU / Leucovorin Modified [place] q14d x 12 cycles     03/28/2021 Imaging   CT CAP  IMPRESSION: 1. Status post left modified radical mastectomy and left axillary lymph node dissection, with no definitive findings to suggest metastatic disease in the chest, abdomen or pelvis on today's examination. Developing postradiation fibrosis in the apex of the left upper lobe is new compared to the prior study, but typical in the setting of prior axillary radiation therapy. 2. There appears to be 2 lesions in the uterus, one of which appears slightly enlarged compared to the prior study, while the largest lesion on the prior examination has  partially regressed, as detailed above. 3. Hepatic steatosis.   06/15/2021 Imaging   EXAM: CT CHEST, ABDOMEN, AND PELVIS WITH CONTRAST  IMPRESSION: 1. Stable exam. No evidence for metastatic disease in the chest, abdomen, or pelvis. 2. 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 3. Uterine fibroids.   08/28/2021 Pathology Results   Diagnosis  Skin, left chest wall, punch  - INFILTRATING CARCINOMA, CONSISTENT WITH BREAST ORIGIN Microscopic Description Sections show an infiltrating tumor composed of cords and strands of single cells arranged in a back-to-back pattern with gland-like structures. The individual tumor cells are pleomorphic  with enlarged nuclei, small nucleoli and an increased mitotic index. The surrounding stroma is collagenous. No connection is seen between the tumor and the overlying dermis.  NOTE: The tumor cells are strongly positive for Cytokeratin 7 and GATA-3 staining. The combined histologic and immunohistochemical findings, in addition to the clinical history, support a diagnosis of breast carcinoma involving the skin.   09/13/2021 PET scan   IMPRESSION: 1. Hypermetabolic left level IV lower neck adenopathy and left supraclavicular and right axillary adenopathy compatible with active malignancy. 2. New nodularity or nodular airspace opacities in the apical segment left upper lobe, and in subpleural portions of the left upper lobe and adjacent left lower lobe with low to moderate activity. This could reflect findings related to interval radiation therapy or possibly pneumonia. Malignancy is less likely given the configuration but not entirely excluded, and surveillance is suggested. 3. The soft tissue mass arising from the right uterine fundus has changed its orientation compared to prior exams, and measures 8.1 cm in long axis. This has mildly greater uptake than the myometrium with maximum SUV of 6.0. Prior characterization has suggested that this is likely a fibroid and the lesion does not appear to have substantially enlarged since earliest available comparison of 03/17/2020. Maximum SUV of 6.0 can be seen in uterine fibroids. The lesion is somewhat unusual in its size and appearance for fibroid, and surveillance is probably reasonable. 4. Accentuated activity along the vaginal vestibule/perineum, without visible CT abnormality, probably from slight urinary incontinence of urinary FDG. 5. High activity along the distal Port-A-Cath catheter, with calcifications in the SVC in the vicinity of this catheter raising suspicion for chronic mural thrombus. The very high activity around the catheter likely  relates to stasis of injected FDG along fibrin sheath/chronic calcification.   09/17/2021 - 02/20/2022 Chemotherapy   Patient is on Treatment Plan : BREAST METASTATIC Sacituzumab govitecan-hziy Caitlyn Hendricks) q21d     09/17/2021 -  Chemotherapy   Patient is on Treatment Plan : BREAST METASTATIC Sacituzumab govitecan-hziy Caitlyn Hendricks) D1,8 q21d        INTERVAL HISTORY:  Caitlyn Hendricks is here for a follow up of breast cancer. She was last seen by me on 03/07/22. She presents to the clinic accompanied by her boyfriend. She plans to move to [place] on 04/06/22.   All other systems were reviewed with the patient and are negative.  MEDICAL HISTORY:  Past Medical History:  Diagnosis Date   Anemia    Anxiety    Arthritis    Cancer ([address])    Left Breast   Depression    GERD (gastroesophageal reflux disease)    Headache    History of radiation therapy 10/30/20-12/11/20   left chest wall and axilla - Dr. Gery Pray   Panic attack     SURGICAL HISTORY: Past Surgical History:  Procedure Laterality Date   MASTECTOMY MODIFIED RADICAL Left 09/20/2020  Procedure: LEFT MODIFIED RADICAL MASTECTOMY;  Surgeon: [name], MD;  Location: [facility];  Service: General;  Laterality: Left;  RNFA; PEC BLOCK;   PORTACATH PLACEMENT Right 03/21/2020   Procedure: INSERTION PORT-A-CATH WITH ULTRASOUND GUIDANCE;  Surgeon: [name], MD;  Location: [facility];  Service: General;  Laterality: Right;    I have reviewed the social history and family history with the patient and they are unchanged from previous note.  ALLERGIES:  is allergic to medroxyprogesterone.  MEDICATIONS:  Current Outpatient Medications  Medication Sig Dispense Refill   gabapentin (NEURONTIN) 300 MG capsule Take 1 capsule (300 mg total) by mouth 3 (three) times daily. 90 capsule 0   ALPRAZolam (XANAX) 1 MG tablet Take 1 mg by mouth 4 (four) times daily as needed for anxiety.     baclofen (LIORESAL) 10 MG  tablet TAKE 1 TABLET BY MOUTH TWICE A DAY AS NEEDED FOR MUSCLE SPASM 60 tablet 0   diphenhydrAMINE (BENADRYL) 25 mg capsule Take 25 mg by mouth every 6 (six) hours as needed for allergies.     diphenoxylate-atropine (LOMOTIL) 2.5-0.025 MG tablet Take 1-2 tablets by mouth 4 (four) times daily as needed for diarrhea or loose stools. 45 tablet 1   Emollient (EUCERIN) lotion Apply 1 application. topically as needed for dry skin (itchyness (red cap)).     gabapentin (NEURONTIN) 100 MG capsule TAKE 1 CAPSULE BY MOUTH 2 TIMES DAILY. OK TO INCREASE DOSE GRADUALLY TO 300MG TWICE DAILY 90 capsule 0   hydroxychloroquine (PLAQUENIL) 200 MG tablet Take 200 mg by mouth 2 (two) times daily.     ibuprofen (ADVIL) 200 MG tablet Take 800 mg by mouth every 8 (eight) hours as needed for moderate pain.     Ibuprofen-diphenhydrAMINE Cit (ADVIL PM PO) Take 1 capsule by mouth at bedtime as needed (sleep).     lidocaine-prilocaine (EMLA) cream Apply 1 Application topically as needed (port access). 30 g 1   lidocaine-prilocaine (EMLA) cream Apply 1 Application topically as needed. 30 g 0   magnesium oxide (MAG-OX) 400 (240 Mg) MG tablet Take 1 tablet (400 mg total) by mouth daily. 60 tablet 1   pantoprazole (PROTONIX) 20 MG tablet TAKE 1 TABLET BY MOUTH EVERY DAY 90 tablet 1   prochlorperazine (COMPAZINE) 10 MG tablet Take 1 tablet (10 mg total) by mouth every 6 (six) hours as needed for nausea or vomiting. 30 tablet 1   sertraline (ZOLOFT) 50 MG tablet Take 50 mg by mouth every morning.     No current facility-administered medications for this visit.   Facility-Administered Medications Ordered in Other Visits  Medication Dose Route Frequency Provider Last Rate Last Admin   acetaminophen (TYLENOL) tablet 650 mg  650 mg Oral Once Truitt Merle, MD       atropine injection 0.5 mg  0.5 mg Intravenous Once PRN Truitt Merle, MD       dexamethasone (DECADRON) 10 mg in sodium chloride 0.9 % 50 mL IVPB  10 mg Intravenous Once Truitt Merle, MD       diphenhydrAMINE (BENADRYL) injection 25 mg  25 mg Intravenous Once Truitt Merle, MD       famotidine (PEPCID) IVPB 20 mg premix  20 mg Intravenous Once Truitt Merle, MD       fosaprepitant (EMEND) 150 mg in sodium chloride 0.9 % 145 mL IVPB  150 mg Intravenous Once Truitt Merle, MD       heparin lock flush 100 unit/mL  500 Units Intracatheter Once PRN  Truitt Merle, MD       palonosetron Leona Carry) injection 0.25 mg  0.25 mg Intravenous Once Truitt Merle, MD       sacituzumab govitecan-hziy (TRODELVY) 810 mg in sodium chloride 0.9 % 250 mL (2.4471 mg/mL) chemo infusion  810 mg Intravenous Once Truitt Merle, MD       sodium chloride flush (NS) 0.9 % injection 10 mL  10 mL Intravenous PRN Alla Feeling, NP   10 mL at 03/28/20 1104   sodium chloride flush (NS) 0.9 % injection 10 mL  10 mL Intracatheter PRN Truitt Merle, MD        PHYSICAL EXAMINATION: ECOG PERFORMANCE STATUS: 1 - Symptomatic but completely ambulatory  Vitals:   03/28/22 0818  BP: 121/87  Pulse: 86  Resp: 16  Temp: 98.1 F (36.7 C)  SpO2: 100%   Wt Readings from Last 3 Encounters:  03/28/22 220 lb 11.2 oz (100.1 kg)  03/14/22 220 lb 12 oz (100.1 kg)  03/07/22 221 lb (100.2 kg)     GENERAL:alert, no distress and comfortable SKIN: skin color normal, no rashes or significant lesions EYES: normal, Conjunctiva are pink and non-injected, sclera clear  NEURO: alert & oriented x 3 with fluent speech  LABORATORY DATA:  I have reviewed the data as listed    Latest Ref Rng & Units 03/28/2022    7:51 AM 03/13/2022    9:17 AM 03/07/2022    8:14 AM  CBC  WBC 4.0 - 10.5 K/uL 2.8  3.3  6.0   Hemoglobin 12.0 - 15.0 g/dL 11.3  10.5  11.9   Hematocrit 36.0 - 46.0 % 34.3  32.1  36.0   Platelets 150 - 400 K/uL 360  289  391         Latest Ref Rng & Units 03/28/2022    7:51 AM 03/13/2022    9:17 AM 03/07/2022    8:14 AM  CMP  Glucose 70 - 99 mg/dL 131  103  116   BUN 6 - 20 mg/dL 14  12  11    Creatinine 0.44 - 1.00 mg/dL 0.73  0.67   0.78   Sodium 135 - 145 mmol/L 140  138  140   Potassium 3.5 - 5.1 mmol/L 3.6  3.7  4.0   Chloride 98 - 111 mmol/L 106  103  106   CO2 22 - 32 mmol/L 27  29  28    Calcium 8.9 - 10.3 mg/dL 8.9  9.7  10.0   Total Protein 6.5 - 8.1 g/dL 6.6  7.0  7.4   Total Bilirubin 0.3 - 1.2 mg/dL 0.4  0.7  0.6   Alkaline Phos 38 - 126 U/L 164  160  159   AST 15 - 41 U/L 40  38  46   ALT 0 - 44 U/L 53  56  58       RADIOGRAPHIC STUDIES: I have personally reviewed the radiological images as listed and agreed with the findings in the report. CT CHEST ABDOMEN PELVIS W CONTRAST  Result Date: 03/27/2022 CLINICAL DATA:  History of stage IV breast cancer, assess treatment response. * Tracking Code: BO * EXAM: CT CHEST, ABDOMEN, AND PELVIS WITH CONTRAST TECHNIQUE: Multidetector CT imaging of the chest, abdomen and pelvis was performed following the standard protocol during bolus administration of intravenous contrast. RADIATION DOSE REDUCTION: This exam was performed according to the departmental dose-optimization program which includes automated exposure control, adjustment of the mA and/or kV according to patient size and/or  use of iterative reconstruction technique. CONTRAST:  172m OMNIPAQUE IOHEXOL 300 MG/ML  SOLN COMPARISON:  Multiple priors including most recent CT chest abdomen pelvis dated December 25, 2021 FINDINGS: CT CHEST FINDINGS Cardiovascular: Accessed right chest wall Port-A-Cath with tip at the superior cavoatrial junction. Normal caliber thoracic aorta. No central pulmonary embolus on this nondedicated study. Normal size heart. No significant pericardial effusion/thickening. Mediastinum/Nodes: No supraclavicular adenopathy. No suspicious thyroid nodule. No pathologically enlarged mediastinal, hilar or axillary lymph nodes. Prior left axillary lymph node dissection. Esophagus is grossly unremarkable. Lungs/Pleura: Similar postradiation scarring in the left upper lobe. No suspicious pulmonary nodules or  masses. No pleural effusion. No pneumothorax. Musculoskeletal: Surgical changes of prior left mastectomy are unchanged from prior. No aggressive lytic or blastic lesion of bone. CT ABDOMEN PELVIS FINDINGS Hepatobiliary: No suspicious hepatic lesion. Hepatic steatosis. Gallbladder is unremarkable. No biliary ductal dilation. Pancreas: No pancreatic ductal dilation or evidence of acute inflammation. Spleen: No splenomegaly or focal splenic lesion. Adrenals/Urinary Tract: Bilateral adrenal glands appear normal. No hydronephrosis. Kidneys demonstrate symmetric enhancement and excretion of contrast material. Urinary bladder is minimally distended limiting evaluation. Stomach/Bowel: Radiopaque enteric contrast material traverses the rectum. Stomach is minimally distended limiting evaluation. No pathologic dilation of small or large bowel. The appendix and terminal ileum appear normal. Vascular/Lymphatic: Normal caliber abdominal aorta. No pathologically enlarged abdominal or pelvic lymph nodes. Reproductive: Similar lobular uterine contour likely reflecting leiomyomas. No suspicious adnexal mass. Other: No abdominopelvic free fluid. No discrete peritoneal or omental nodularity. Musculoskeletal: No aggressive lytic or blastic lesion of bone. IMPRESSION: 1. Surgical changes of prior left mastectomy and left axillary lymph node dissection with postradiation change in the left lung, no evidence of local recurrence. 2. No evidence of metastatic disease in the chest, abdomen or pelvis. 3. Similar lobular uterine contour likely reflects leiomyomas. Electronically Signed   By: JDahlia BailiffM.D.   On: 03/27/2022 13:51      No orders of the defined types were placed in this encounter.  All questions were answered. The patient knows to call the clinic with any problems, questions or concerns. No barriers to learning was detected. The total time spent in the appointment was 30 minutes.     YTruitt Merle MD 03/28/2022   I,  KWilburn Mylar am acting as scribe for YTruitt Merle MD.   I have reviewed the above documentation for accuracy and completeness, and I agree with the above.

## 2022-03-28 NOTE — Progress Notes (Signed)
Epic faxed pt's labs to pt's dermatologist and Dr. Ernestina Penna office note & CT Scan result to pt's new Oncologist in [place], [place].  Epic Fax confirmation received.

## 2022-03-28 NOTE — Patient Instructions (Signed)
[facility]COL[facility]  Discharge Instructions: Thank you for choosing [facility] to provide your oncology and hematology care.   If you have a lab appointment with the [facility], please go directly to the [facility] and check in at the registration area.   Wear comfortable clothing and clothing appropriate for easy access to any Portacath or PICC line.   We strive to give you quality time with your provider. You may need to reschedule your appointment if you arrive late (15 or more minutes).  Arriving late affects you and other patients whose appointments are after yours.  Also, if you miss three or more appointments without notifying the office, you may be dismissed from the clinic at the provider's discretion.      For prescription refill requests, have your pharmacy contact our office and allow 72 hours for refills to be completed.    Today you received the following chemotherapy and/or immunotherapy agents: sacituzumab govitecan Caitlyn Hendricks)      To help prevent nausea and vomiting after your treatment, we encourage you to take your nausea medication as directed.  BELOW ARE SYMPTOMS THAT SHOULD BE REPORTED IMMEDIATELY: *FEVER GREATER THAN 100.4 F (38 C) OR HIGHER *CHILLS OR SWEATING *NAUSEA AND VOMITING THAT IS NOT CONTROLLED WITH YOUR NAUSEA MEDICATION *UNUSUAL SHORTNESS OF BREATH *UNUSUAL BRUISING OR BLEEDING *URINARY PROBLEMS (pain or burning when urinating, or frequent urination) *BOWEL PROBLEMS (unusual diarrhea, constipation, pain near the anus) TENDERNESS IN MOUTH AND THROAT WITH OR WITHOUT PRESENCE OF ULCERS (sore throat, sores in mouth, or a toothache) UNUSUAL RASH, SWELLING OR PAIN  UNUSUAL VAGINAL DISCHARGE OR ITCHING   Items with * indicate a potential emergency and should be followed up as soon as possible or go to the Emergency Department if any problems should occur.  Please show the CHEMOTHERAPY ALERT CARD or IMMUNOTHERAPY  ALERT CARD at check-in to the Emergency Department and triage nurse.  Should you have questions after your visit or need to cancel or reschedule your appointment, please contact [facility]  Dept: [phone]  and follow the prompts.  Office hours are 8:00 a.m. to 4:30 p.m. Monday - Friday. Please note that voicemails left after 4:00 p.m. may not be returned until the following business day.  We are closed weekends and major holidays. You have access to a nurse at all times for urgent questions. Please call the main number to the clinic Dept: [phone] and follow the prompts.   For any non-urgent questions, you may also contact your provider using MyChart. We now offer e-Visits for anyone 40 and older to request care online for non-urgent symptoms. For details visit [email or web address].   Also download the MyChart app! Go to the app store, search "MyChart", open the app, select [facility], and log in with your MyChart username and password.  Masks are optional in the cancer centers. If you would like for your care team to wear a mask while they are taking care of you, please let them know. You may have one support person who is at least 39 years old accompany you for your appointments.

## 2022-03-28 NOTE — Progress Notes (Signed)
[facility] Spiritual Care Note  Followed up with Vicente Males in infusion at her penultimate treatment here at [facility] (before her move to [place]). She was in good spirits overall, feeling relieved at recent scan results and at her packing progress.   Provided pastoral presence, empathic listening, and affirmation of strengths, especially her dedication to her own wellbeing during this transition.  We plan to follow up at her last treatment here next week.   [place], [place], [facility] Pager [phone] Voicemail [phone]

## 2022-04-03 MED FILL — Fosaprepitant Dimeglumine For IV Infusion 150 MG (Base Eq): INTRAVENOUS | Qty: 5 | Status: AC

## 2022-04-03 MED FILL — Dexamethasone Sodium Phosphate Inj 100 MG/10ML: INTRAMUSCULAR | Qty: 1 | Status: AC

## 2022-04-04 ENCOUNTER — Inpatient Hospital Stay: Payer: Medicaid Other

## 2022-04-04 ENCOUNTER — Other Ambulatory Visit: Payer: Self-pay

## 2022-04-04 ENCOUNTER — Encounter: Payer: Self-pay | Admitting: General Practice

## 2022-04-04 ENCOUNTER — Other Ambulatory Visit: Payer: Self-pay | Admitting: Hematology

## 2022-04-04 VITALS — BP 113/80 | HR 87 | Temp 98.0°F | Resp 16 | Wt 220.8 lb

## 2022-04-04 DIAGNOSIS — C50112 Malignant neoplasm of central portion of left female breast: Secondary | ICD-10-CM

## 2022-04-04 DIAGNOSIS — Z95828 Presence of other vascular implants and grafts: Secondary | ICD-10-CM

## 2022-04-04 DIAGNOSIS — Z5111 Encounter for antineoplastic chemotherapy: Secondary | ICD-10-CM | POA: Diagnosis not present

## 2022-04-04 LAB — CBC WITH DIFFERENTIAL (CANCER CENTER ONLY)
Abs Immature Granulocytes: 0.01 10*3/uL (ref 0.00–0.07)
Basophils Absolute: 0 10*3/uL (ref 0.0–0.1)
Basophils Relative: 1 %
Eosinophils Absolute: 0 10*3/uL (ref 0.0–0.5)
Eosinophils Relative: 2 %
HCT: 32.6 % — ABNORMAL LOW (ref 36.0–46.0)
Hemoglobin: 10.7 g/dL — ABNORMAL LOW (ref 12.0–15.0)
Immature Granulocytes: 1 %
Lymphocytes Relative: 37 %
Lymphs Abs: 0.6 10*3/uL — ABNORMAL LOW (ref 0.7–4.0)
MCH: 29.2 pg (ref 26.0–34.0)
MCHC: 32.8 g/dL (ref 30.0–36.0)
MCV: 88.8 fL (ref 80.0–100.0)
Monocytes Absolute: 0.2 10*3/uL (ref 0.1–1.0)
Monocytes Relative: 12 %
Neutro Abs: 0.8 10*3/uL — ABNORMAL LOW (ref 1.7–7.7)
Neutrophils Relative %: 47 %
Platelet Count: 313 10*3/uL (ref 150–400)
RBC: 3.67 MIL/uL — ABNORMAL LOW (ref 3.87–5.11)
RDW: 18.7 % — ABNORMAL HIGH (ref 11.5–15.5)
Smear Review: NORMAL
WBC Count: 1.7 10*3/uL — ABNORMAL LOW (ref 4.0–10.5)
nRBC: 0 % (ref 0.0–0.2)

## 2022-04-04 LAB — CMP (CANCER CENTER ONLY)
ALT: 73 U/L — ABNORMAL HIGH (ref 0–44)
AST: 54 U/L — ABNORMAL HIGH (ref 15–41)
Albumin: 3.9 g/dL (ref 3.5–5.0)
Alkaline Phosphatase: 167 U/L — ABNORMAL HIGH (ref 38–126)
Anion gap: 11 (ref 5–15)
BUN: 16 mg/dL (ref 6–20)
CO2: 28 mmol/L (ref 22–32)
Calcium: 8.9 mg/dL (ref 8.9–10.3)
Chloride: 100 mmol/L (ref 98–111)
Creatinine: 0.7 mg/dL (ref 0.44–1.00)
GFR, Estimated: >60 mL/min (ref >60)
Glucose, Bld: 116 mg/dL — ABNORMAL HIGH (ref 70–99)
Potassium: 3.7 mmol/L (ref 3.5–5.1)
Sodium: 139 mmol/L (ref 135–145)
Total Bilirubin: 0.5 mg/dL (ref 0.3–1.2)
Total Protein: 6.9 g/dL (ref 6.5–8.1)

## 2022-04-04 LAB — PHOSPHORUS: Phosphorus: 3.2 mg/dL (ref 2.5–4.6)

## 2022-04-04 LAB — MAGNESIUM: Magnesium: 1.4 mg/dL — ABNORMAL LOW (ref 1.7–2.4)

## 2022-04-04 MED ORDER — PALONOSETRON HCL INJECTION 0.25 MG/5ML
0.2500 mg | Freq: Once | INTRAVENOUS | Status: AC
  Administered 2022-04-04: 0.25 mg via INTRAVENOUS
  Filled 2022-04-04: qty 5

## 2022-04-04 MED ORDER — SODIUM CHLORIDE 0.9 % IV SOLN: Freq: Once | INTRAVENOUS | Status: AC

## 2022-04-04 MED ORDER — HEPARIN SOD (PORK) LOCK FLUSH 100 UNIT/ML IV SOLN
500.0000 [IU] | Freq: Once | INTRAVENOUS | Status: AC | PRN
  Administered 2022-04-04: 500 [IU]

## 2022-04-04 MED ORDER — SODIUM CHLORIDE 0.9 % IV SOLN
150.0000 mg | Freq: Once | INTRAVENOUS | Status: AC
  Administered 2022-04-04: 150 mg via INTRAVENOUS
  Filled 2022-04-04: qty 150

## 2022-04-04 MED ORDER — SODIUM CHLORIDE 0.9 % IV SOLN
10.0000 mg | Freq: Once | INTRAVENOUS | Status: AC
  Administered 2022-04-04: 10 mg via INTRAVENOUS
  Filled 2022-04-04: qty 10

## 2022-04-04 MED ORDER — FAMOTIDINE IN NACL 20-0.9 MG/50ML-% IV SOLN
20.0000 mg | Freq: Once | INTRAVENOUS | Status: AC
  Administered 2022-04-04: 20 mg via INTRAVENOUS
  Filled 2022-04-04: qty 50

## 2022-04-04 MED ORDER — SODIUM CHLORIDE 0.9% FLUSH
10.0000 mL | INTRAVENOUS | Status: DC | PRN
  Administered 2022-04-04: 10 mL

## 2022-04-04 MED ORDER — ACETAMINOPHEN 325 MG PO TABS
650.0000 mg | ORAL_TABLET | Freq: Once | ORAL | Status: AC
  Administered 2022-04-04: 650 mg via ORAL
  Filled 2022-04-04: qty 2

## 2022-04-04 MED ORDER — SODIUM CHLORIDE 0.9 % IV SOLN
6.0000 mg/kg | Freq: Once | INTRAVENOUS | Status: AC
  Administered 2022-04-04: 610 mg via INTRAVENOUS
  Filled 2022-04-04: qty 61

## 2022-04-04 MED ORDER — ATROPINE SULFATE 1 MG/ML IV SOLN
0.5000 mg | Freq: Once | INTRAVENOUS | Status: AC | PRN
  Administered 2022-04-04: 0.5 mg via INTRAVENOUS
  Filled 2022-04-04: qty 1

## 2022-04-04 MED ORDER — MAGNESIUM SULFATE 2 GM/50ML IV SOLN
2.0000 g | Freq: Once | INTRAVENOUS | Status: AC
  Administered 2022-04-04: 2 g via INTRAVENOUS
  Filled 2022-04-04: qty 50

## 2022-04-04 MED ORDER — DIPHENHYDRAMINE HCL 50 MG/ML IJ SOLN
25.0000 mg | Freq: Once | INTRAMUSCULAR | Status: AC
  Administered 2022-04-04: 25 mg via INTRAVENOUS
  Filled 2022-04-04: qty 1

## 2022-04-04 MED ORDER — SODIUM CHLORIDE 0.9% FLUSH
10.0000 mL | Freq: Once | INTRAVENOUS | Status: AC
  Administered 2022-04-04: 10 mL

## 2022-04-04 NOTE — Progress Notes (Signed)
OK to treat per Dr. [name] with today's [place] of 0.8.

## 2022-04-04 NOTE — Patient Instructions (Signed)
[facility]COLOG[facility]  Discharge Instructions: Thank you for choosing [facility] to provide your oncology and hematology care.   If you have a lab appointment with the [facility], please go directly to the [facility] and check in at the registration area.   Wear comfortable clothing and clothing appropriate for easy access to any Portacath or PICC line.   We strive to give you quality time with your provider. You may need to reschedule your appointment if you arrive late (15 or more minutes).  Arriving late affects you and other patients whose appointments are after yours.  Also, if you miss three or more appointments without notifying the office, you may be dismissed from the clinic at the provider's discretion.      For prescription refill requests, have your pharmacy contact our office and allow 72 hours for refills to be completed.    Today you received the following chemotherapy and/or immunotherapy agents: sacituzumab govitecan [name])      To help prevent nausea and vomiting after your treatment, we encourage you to take your nausea medication as directed.  BELOW ARE SYMPTOMS THAT SHOULD BE REPORTED IMMEDIATELY: *FEVER GREATER THAN 100.4 F (38 C) OR HIGHER *CHILLS OR SWEATING *NAUSEA AND VOMITING THAT IS NOT CONTROLLED WITH YOUR NAUSEA MEDICATION *UNUSUAL SHORTNESS OF BREATH *UNUSUAL BRUISING OR BLEEDING *URINARY PROBLEMS (pain or burning when urinating, or frequent urination) *BOWEL PROBLEMS (unusual diarrhea, constipation, pain near the anus) TENDERNESS IN MOUTH AND THROAT WITH OR WITHOUT PRESENCE OF ULCERS (sore throat, sores in mouth, or a toothache) UNUSUAL RASH, SWELLING OR PAIN  UNUSUAL VAGINAL DISCHARGE OR ITCHING   Items with * indicate a potential emergency and should be followed up as soon as possible or go to the Emergency Department if any problems should occur.  Please show the CHEMOTHERAPY ALERT CARD or IMMUNOTHERAPY  ALERT CARD at check-in to the Emergency Department and triage nurse.  Should you have questions after your visit or need to cancel or reschedule your appointment, please contact [facility]  Dept: [phone]  and follow the prompts.  Office hours are 8:00 a.m. to 4:30 p.m. Monday - Friday. Please note that voicemails left after 4:00 p.m. may not be returned until the following business day.  We are closed weekends and major holidays. You have access to a nurse at all times for urgent questions. Please call the main number to the clinic Dept: [phone] and follow the prompts.   For any non-urgent questions, you may also contact your provider using MyChart. We now offer e-Visits for anyone 65 and older to request care online for non-urgent symptoms. For details visit [email or web address].   Also download the MyChart app! Go to the app store, search "MyChart", open the app, select [facility], and log in with your MyChart username and password.  Masks are optional in the cancer centers. If you would like for your care team to wear a mask while they are taking care of you, please let them know. You may have one support person who is at least 39 years old accompany you for your appointments.

## 2022-04-04 NOTE — Progress Notes (Signed)
[facility] Spiritual Care Note  Followed up with Caitlyn Hendricks during her last infusion before moving to [employer]. She is working hard to process the social and logistical changes that relocation entails and is understandably looking forward to being on the other side of the transition. She has support at both ends of the move, which will further help her processing and reflection.  Provided empathic listening, emotional support, normalization of feelings, and affirmation of strengths as we said goodbye.   [place], [place], [facility] Pager [phone] Voicemail [phone]

## 2022-04-05 ENCOUNTER — Inpatient Hospital Stay: Payer: Medicaid Other

## 2022-04-05 VITALS — BP 145/93 | HR 96 | Temp 98.5°F | Resp 16

## 2022-04-05 DIAGNOSIS — Z171 Estrogen receptor negative status [ER-]: Secondary | ICD-10-CM

## 2022-04-05 DIAGNOSIS — Z5111 Encounter for antineoplastic chemotherapy: Secondary | ICD-10-CM | POA: Diagnosis not present

## 2022-04-05 MED ORDER — PEGFILGRASTIM-CBQV 6 MG/0.6ML ~~LOC~~ SOSY
6.0000 mg | PREFILLED_SYRINGE | Freq: Once | SUBCUTANEOUS | Status: AC
  Administered 2022-04-05: 6 mg via SUBCUTANEOUS
  Filled 2022-04-05: qty 0.6

## 2022-04-20 ENCOUNTER — Other Ambulatory Visit: Payer: Self-pay

## 2022-05-03 ENCOUNTER — Other Ambulatory Visit: Payer: Self-pay | Admitting: Hematology

## 2022-05-03 ENCOUNTER — Other Ambulatory Visit: Payer: Self-pay

## 2022-05-03 DIAGNOSIS — Z171 Estrogen receptor negative status [ER-]: Secondary | ICD-10-CM

## 2022-05-05 ENCOUNTER — Other Ambulatory Visit: Payer: Self-pay

## 2022-06-05 ENCOUNTER — Other Ambulatory Visit: Payer: Self-pay | Admitting: Hematology

## 2022-06-08 ENCOUNTER — Other Ambulatory Visit: Payer: Self-pay

## 2022-06-11 ENCOUNTER — Other Ambulatory Visit: Payer: Self-pay | Admitting: Hematology

## 2023-05-09 DEATH — deceased
